# Patient Record
Sex: Female | Born: 1945 | Race: White | Hispanic: No | Marital: Married | Smoking: Never smoker
Health system: Southern US, Community
[De-identification: ages and names within clinical notes are randomized; demographics above are authoritative.]

## PROBLEM LIST (undated history)

## (undated) DIAGNOSIS — K219 Gastro-esophageal reflux disease without esophagitis: Secondary | ICD-10-CM

## (undated) DIAGNOSIS — M7630 Iliotibial band syndrome, unspecified leg: Secondary | ICD-10-CM

## (undated) DIAGNOSIS — J939 Pneumothorax, unspecified: Secondary | ICD-10-CM

## (undated) DIAGNOSIS — F329 Major depressive disorder, single episode, unspecified: Secondary | ICD-10-CM

## (undated) DIAGNOSIS — IMO0001 Reserved for inherently not codable concepts without codable children: Secondary | ICD-10-CM

## (undated) DIAGNOSIS — R0600 Dyspnea, unspecified: Secondary | ICD-10-CM

## (undated) DIAGNOSIS — D45 Polycythemia vera: Principal | ICD-10-CM

## (undated) DIAGNOSIS — T7840XA Allergy, unspecified, initial encounter: Secondary | ICD-10-CM

## (undated) DIAGNOSIS — I493 Ventricular premature depolarization: Secondary | ICD-10-CM

## (undated) DIAGNOSIS — G43909 Migraine, unspecified, not intractable, without status migrainosus: Secondary | ICD-10-CM

## (undated) DIAGNOSIS — F32A Depression, unspecified: Secondary | ICD-10-CM

## (undated) DIAGNOSIS — N189 Chronic kidney disease, unspecified: Secondary | ICD-10-CM

## (undated) DIAGNOSIS — K8020 Calculus of gallbladder without cholecystitis without obstruction: Secondary | ICD-10-CM

## (undated) DIAGNOSIS — G713 Mitochondrial myopathy, not elsewhere classified: Secondary | ICD-10-CM

## (undated) DIAGNOSIS — Z0389 Encounter for observation for other suspected diseases and conditions ruled out: Secondary | ICD-10-CM

## (undated) DIAGNOSIS — I219 Acute myocardial infarction, unspecified: Secondary | ICD-10-CM

## (undated) DIAGNOSIS — K589 Irritable bowel syndrome without diarrhea: Secondary | ICD-10-CM

## (undated) DIAGNOSIS — I499 Cardiac arrhythmia, unspecified: Secondary | ICD-10-CM

## (undated) DIAGNOSIS — Z5189 Encounter for other specified aftercare: Secondary | ICD-10-CM

## (undated) DIAGNOSIS — D649 Anemia, unspecified: Secondary | ICD-10-CM

## (undated) DIAGNOSIS — S22000A Wedge compression fracture of unspecified thoracic vertebra, initial encounter for closed fracture: Secondary | ICD-10-CM

## (undated) DIAGNOSIS — K649 Unspecified hemorrhoids: Secondary | ICD-10-CM

## (undated) DIAGNOSIS — E559 Vitamin D deficiency, unspecified: Secondary | ICD-10-CM

## (undated) DIAGNOSIS — F419 Anxiety disorder, unspecified: Secondary | ICD-10-CM

## (undated) DIAGNOSIS — J45909 Unspecified asthma, uncomplicated: Secondary | ICD-10-CM

## (undated) DIAGNOSIS — I319 Disease of pericardium, unspecified: Secondary | ICD-10-CM

## (undated) DIAGNOSIS — H269 Unspecified cataract: Secondary | ICD-10-CM

## (undated) HISTORY — DX: Encounter for observation for other suspected diseases and conditions ruled out: Z03.89

## (undated) HISTORY — DX: Iliotibial band syndrome, unspecified leg: M76.30

## (undated) HISTORY — PX: MOUTH SURGERY: SHX715

## (undated) HISTORY — DX: Ventricular premature depolarization: I49.3

## (undated) HISTORY — DX: Vitamin D deficiency, unspecified: E55.9

## (undated) HISTORY — DX: Polycythemia vera: D45

## (undated) HISTORY — DX: Migraine, unspecified, not intractable, without status migrainosus: G43.909

## (undated) HISTORY — PX: APPENDECTOMY: SHX54

## (undated) HISTORY — PX: VARICOSE VEIN SURGERY: SHX832

## (undated) HISTORY — DX: Pneumothorax, unspecified: J93.9

## (undated) HISTORY — DX: Reserved for inherently not codable concepts without codable children: IMO0001

## (undated) HISTORY — DX: Mitochondrial myopathy, not elsewhere classified: G71.3

## (undated) HISTORY — PX: CARDIAC CATHETERIZATION: SHX172

## (undated) HISTORY — DX: Irritable bowel syndrome, unspecified: K58.9

## (undated) HISTORY — PX: CATARACT EXTRACTION W/ INTRAOCULAR LENS  IMPLANT, BILATERAL: SHX1307

## (undated) HISTORY — DX: Unspecified hemorrhoids: K64.9

## (undated) HISTORY — DX: Allergy, unspecified, initial encounter: T78.40XA

## (undated) HISTORY — DX: Disease of pericardium, unspecified: I31.9

## (undated) HISTORY — DX: Anemia, unspecified: D64.9

## (undated) HISTORY — PX: NASAL SINUS SURGERY: SHX719

## (undated) HISTORY — PX: COLONOSCOPY: SHX174

## (undated) HISTORY — DX: Anxiety disorder, unspecified: F41.9

## (undated) HISTORY — DX: Encounter for other specified aftercare: Z51.89

## (undated) HISTORY — DX: Unspecified cataract: H26.9

## (undated) HISTORY — DX: Calculus of gallbladder without cholecystitis without obstruction: K80.20

## (undated) HISTORY — DX: Unspecified asthma, uncomplicated: J45.909

## (undated) HISTORY — DX: Acute myocardial infarction, unspecified: I21.9

---

## 1997-05-12 ENCOUNTER — Emergency Department (HOSPITAL_COMMUNITY): Admission: EM | Admit: 1997-05-12 | Discharge: 1997-05-12 | Payer: Self-pay | Admitting: Emergency Medicine

## 1997-06-21 ENCOUNTER — Encounter: Admission: RE | Admit: 1997-06-21 | Discharge: 1997-06-21 | Payer: Self-pay | Admitting: Sports Medicine

## 1997-11-27 ENCOUNTER — Encounter: Admission: RE | Admit: 1997-11-27 | Discharge: 1997-11-27 | Payer: Self-pay | Admitting: Sports Medicine

## 1997-12-17 ENCOUNTER — Encounter: Admission: RE | Admit: 1997-12-17 | Discharge: 1997-12-17 | Payer: Self-pay | Admitting: Family Medicine

## 1998-09-26 ENCOUNTER — Encounter: Admission: RE | Admit: 1998-09-26 | Discharge: 1998-09-26 | Payer: Self-pay | Admitting: Sports Medicine

## 1998-10-08 ENCOUNTER — Encounter: Admission: RE | Admit: 1998-10-08 | Discharge: 1998-10-08 | Payer: Self-pay | Admitting: Family Medicine

## 1998-12-10 ENCOUNTER — Encounter: Admission: RE | Admit: 1998-12-10 | Discharge: 1998-12-10 | Payer: Self-pay | Admitting: Sports Medicine

## 1998-12-17 ENCOUNTER — Encounter: Admission: RE | Admit: 1998-12-17 | Discharge: 1998-12-17 | Payer: Self-pay | Admitting: Sports Medicine

## 1998-12-17 ENCOUNTER — Other Ambulatory Visit: Admission: RE | Admit: 1998-12-17 | Discharge: 1998-12-17 | Payer: Self-pay | Admitting: Family Medicine

## 1999-01-31 ENCOUNTER — Encounter: Payer: Self-pay | Admitting: Sports Medicine

## 1999-01-31 ENCOUNTER — Encounter: Admission: RE | Admit: 1999-01-31 | Discharge: 1999-01-31 | Payer: Self-pay | Admitting: Sports Medicine

## 1999-10-23 ENCOUNTER — Encounter: Admission: RE | Admit: 1999-10-23 | Discharge: 1999-10-23 | Payer: Self-pay | Admitting: Sports Medicine

## 1999-12-03 ENCOUNTER — Encounter: Admission: RE | Admit: 1999-12-03 | Discharge: 1999-12-03 | Payer: Self-pay | Admitting: Family Medicine

## 1999-12-11 ENCOUNTER — Encounter: Admission: RE | Admit: 1999-12-11 | Discharge: 1999-12-11 | Payer: Self-pay | Admitting: Sports Medicine

## 2000-03-18 ENCOUNTER — Encounter: Admission: RE | Admit: 2000-03-18 | Discharge: 2000-03-18 | Payer: Self-pay | Admitting: Family Medicine

## 2000-03-22 ENCOUNTER — Encounter: Admission: RE | Admit: 2000-03-22 | Discharge: 2000-03-22 | Payer: Self-pay | Admitting: Sports Medicine

## 2000-03-22 ENCOUNTER — Encounter: Payer: Self-pay | Admitting: Sports Medicine

## 2000-10-28 ENCOUNTER — Encounter: Admission: RE | Admit: 2000-10-28 | Discharge: 2000-10-28 | Payer: Self-pay | Admitting: Sports Medicine

## 2000-11-04 ENCOUNTER — Encounter: Payer: Self-pay | Admitting: Sports Medicine

## 2000-11-04 ENCOUNTER — Encounter: Admission: RE | Admit: 2000-11-04 | Discharge: 2000-11-04 | Payer: Self-pay | Admitting: Sports Medicine

## 2000-12-02 ENCOUNTER — Encounter: Admission: RE | Admit: 2000-12-02 | Discharge: 2000-12-02 | Payer: Self-pay | Admitting: Family Medicine

## 2001-02-23 ENCOUNTER — Encounter: Admission: RE | Admit: 2001-02-23 | Discharge: 2001-02-23 | Payer: Self-pay | Admitting: Family Medicine

## 2001-02-23 ENCOUNTER — Ambulatory Visit (HOSPITAL_COMMUNITY): Admission: RE | Admit: 2001-02-23 | Discharge: 2001-02-23 | Payer: Self-pay | Admitting: Family Medicine

## 2001-02-25 ENCOUNTER — Ambulatory Visit (HOSPITAL_COMMUNITY): Admission: RE | Admit: 2001-02-25 | Discharge: 2001-02-25 | Payer: Self-pay | Admitting: Sports Medicine

## 2001-02-25 ENCOUNTER — Encounter: Payer: Self-pay | Admitting: Sports Medicine

## 2001-02-25 ENCOUNTER — Inpatient Hospital Stay (HOSPITAL_COMMUNITY): Admission: EM | Admit: 2001-02-25 | Discharge: 2001-02-26 | Payer: Self-pay | Admitting: Emergency Medicine

## 2001-03-10 ENCOUNTER — Encounter: Admission: RE | Admit: 2001-03-10 | Discharge: 2001-03-10 | Payer: Self-pay | Admitting: Family Medicine

## 2001-09-14 ENCOUNTER — Encounter: Payer: Self-pay | Admitting: Internal Medicine

## 2001-10-13 ENCOUNTER — Encounter: Payer: Self-pay | Admitting: Internal Medicine

## 2001-11-03 ENCOUNTER — Encounter: Admission: RE | Admit: 2001-11-03 | Discharge: 2001-11-03 | Payer: Self-pay | Admitting: Sports Medicine

## 2002-07-25 ENCOUNTER — Encounter: Admission: RE | Admit: 2002-07-25 | Discharge: 2002-07-25 | Payer: Self-pay | Admitting: Sports Medicine

## 2002-08-03 ENCOUNTER — Encounter: Admission: RE | Admit: 2002-08-03 | Discharge: 2002-08-03 | Payer: Self-pay | Admitting: Sports Medicine

## 2002-08-31 ENCOUNTER — Encounter: Admission: RE | Admit: 2002-08-31 | Discharge: 2002-08-31 | Payer: Self-pay | Admitting: Family Medicine

## 2002-12-26 ENCOUNTER — Encounter: Admission: RE | Admit: 2002-12-26 | Discharge: 2003-03-26 | Payer: Self-pay | Admitting: Sports Medicine

## 2003-02-02 ENCOUNTER — Emergency Department (HOSPITAL_COMMUNITY): Admission: EM | Admit: 2003-02-02 | Discharge: 2003-02-02 | Payer: Self-pay | Admitting: Emergency Medicine

## 2003-04-27 ENCOUNTER — Encounter (INDEPENDENT_AMBULATORY_CARE_PROVIDER_SITE_OTHER): Payer: Self-pay | Admitting: *Deleted

## 2003-04-27 LAB — CONVERTED CEMR LAB

## 2003-05-17 ENCOUNTER — Encounter: Admission: RE | Admit: 2003-05-17 | Discharge: 2003-05-17 | Payer: Self-pay | Admitting: Sports Medicine

## 2003-05-17 ENCOUNTER — Encounter: Payer: Self-pay | Admitting: Sports Medicine

## 2003-05-30 ENCOUNTER — Encounter: Admission: RE | Admit: 2003-05-30 | Discharge: 2003-05-30 | Payer: Self-pay | Admitting: Family Medicine

## 2003-06-06 ENCOUNTER — Encounter: Admission: RE | Admit: 2003-06-06 | Discharge: 2003-06-06 | Payer: Self-pay | Admitting: Family Medicine

## 2003-08-31 ENCOUNTER — Encounter: Admission: RE | Admit: 2003-08-31 | Discharge: 2003-08-31 | Payer: Self-pay | Admitting: Family Medicine

## 2004-01-31 ENCOUNTER — Ambulatory Visit: Payer: Self-pay | Admitting: Sports Medicine

## 2004-02-05 ENCOUNTER — Ambulatory Visit: Payer: Self-pay | Admitting: Family Medicine

## 2004-11-14 ENCOUNTER — Ambulatory Visit: Payer: Self-pay | Admitting: Sports Medicine

## 2005-04-09 ENCOUNTER — Ambulatory Visit: Payer: Self-pay | Admitting: Sports Medicine

## 2005-04-20 ENCOUNTER — Ambulatory Visit: Payer: Self-pay | Admitting: Family Medicine

## 2006-02-19 ENCOUNTER — Ambulatory Visit: Payer: Self-pay | Admitting: Family Medicine

## 2006-03-25 DIAGNOSIS — G250 Essential tremor: Secondary | ICD-10-CM | POA: Insufficient documentation

## 2006-03-25 DIAGNOSIS — D510 Vitamin B12 deficiency anemia due to intrinsic factor deficiency: Secondary | ICD-10-CM | POA: Insufficient documentation

## 2006-03-25 DIAGNOSIS — G252 Other specified forms of tremor: Secondary | ICD-10-CM

## 2006-03-25 DIAGNOSIS — K589 Irritable bowel syndrome without diarrhea: Secondary | ICD-10-CM | POA: Insufficient documentation

## 2006-03-25 DIAGNOSIS — N951 Menopausal and female climacteric states: Secondary | ICD-10-CM | POA: Insufficient documentation

## 2006-03-25 DIAGNOSIS — J449 Chronic obstructive pulmonary disease, unspecified: Secondary | ICD-10-CM | POA: Insufficient documentation

## 2006-03-26 ENCOUNTER — Encounter (INDEPENDENT_AMBULATORY_CARE_PROVIDER_SITE_OTHER): Payer: Self-pay | Admitting: *Deleted

## 2006-04-25 ENCOUNTER — Observation Stay (HOSPITAL_COMMUNITY): Admission: AD | Admit: 2006-04-25 | Discharge: 2006-04-26 | Payer: Self-pay | Admitting: Emergency Medicine

## 2006-04-25 ENCOUNTER — Encounter: Payer: Self-pay | Admitting: Emergency Medicine

## 2006-04-25 ENCOUNTER — Ambulatory Visit: Payer: Self-pay | Admitting: Family Medicine

## 2006-04-27 HISTORY — PX: NM MYOCAR PERF WALL MOTION: HXRAD629

## 2006-05-20 ENCOUNTER — Ambulatory Visit: Payer: Self-pay | Admitting: Sports Medicine

## 2006-07-16 ENCOUNTER — Ambulatory Visit: Payer: Self-pay | Admitting: Family Medicine

## 2006-10-13 ENCOUNTER — Encounter: Payer: Self-pay | Admitting: Sports Medicine

## 2007-07-28 ENCOUNTER — Ambulatory Visit: Payer: Self-pay | Admitting: Sports Medicine

## 2007-07-28 DIAGNOSIS — G43909 Migraine, unspecified, not intractable, without status migrainosus: Secondary | ICD-10-CM | POA: Insufficient documentation

## 2007-07-28 DIAGNOSIS — G729 Myopathy, unspecified: Secondary | ICD-10-CM | POA: Insufficient documentation

## 2007-07-28 LAB — CONVERTED CEMR LAB
Cholesterol: 194 mg/dL (ref 0–200)
HCT: 46.7 % — ABNORMAL HIGH (ref 36.0–46.0)
HDL: 65 mg/dL (ref >39)
Hemoglobin: 15.4 g/dL — ABNORMAL HIGH (ref 12.0–15.0)
MCHC: 33 g/dL (ref 30.0–36.0)
MCV: 108.4 fL — ABNORMAL HIGH (ref 78.0–100.0)
Platelets: 155 10*3/uL (ref 150–400)
RBC: 4.31 M/uL (ref 3.87–5.11)
RDW: 13.8 % (ref 11.5–15.5)
TSH: 2.418 microintl units/mL (ref 0.350–4.50)
Vit D, 1,25-Dihydroxy: 27 — ABNORMAL LOW (ref 30–89)
Vitamin B-12: 342 pg/mL (ref 211–911)
WBC: 5.1 10*3/uL (ref 4.0–10.5)

## 2007-08-10 ENCOUNTER — Encounter: Payer: Self-pay | Admitting: *Deleted

## 2007-09-27 ENCOUNTER — Encounter: Payer: Self-pay | Admitting: Sports Medicine

## 2007-11-11 ENCOUNTER — Ambulatory Visit: Payer: Self-pay | Admitting: Family Medicine

## 2007-12-16 ENCOUNTER — Encounter: Payer: Self-pay | Admitting: Family Medicine

## 2007-12-16 LAB — CONVERTED CEMR LAB: Pap Smear: NORMAL

## 2008-06-20 ENCOUNTER — Encounter: Payer: Self-pay | Admitting: Family Medicine

## 2008-07-27 ENCOUNTER — Ambulatory Visit: Payer: Self-pay | Admitting: Family Medicine

## 2008-07-27 LAB — CONVERTED CEMR LAB
ALT: 23 units/L (ref 0–35)
AST: 31 units/L (ref 0–37)
Albumin: 4.1 g/dL (ref 3.5–5.2)
Alkaline Phosphatase: 58 units/L (ref 39–117)
BUN: 13 mg/dL (ref 6–23)
Basophils Absolute: 0 10*3/uL (ref 0.0–0.1)
Basophils Relative: 0 % (ref 0–1)
CO2: 23 meq/L (ref 19–32)
Calcium: 9.4 mg/dL (ref 8.4–10.5)
Chloride: 104 meq/L (ref 96–112)
Cholesterol: 215 mg/dL — ABNORMAL HIGH (ref 0–200)
Creatinine, Ser: 0.65 mg/dL (ref 0.40–1.20)
Eosinophils Absolute: 0.1 10*3/uL (ref 0.0–0.7)
Eosinophils Relative: 1 % (ref 0–5)
Glucose, Bld: 85 mg/dL (ref 70–99)
HCT: 44.3 % (ref 36.0–46.0)
HDL: 81 mg/dL (ref >39)
Hemoglobin: 15.3 g/dL — ABNORMAL HIGH (ref 12.0–15.0)
LDL Cholesterol: 116 mg/dL — ABNORMAL HIGH (ref 0–99)
Lymphocytes Relative: 23 % (ref 12–46)
Lymphs Abs: 1.6 10*3/uL (ref 0.7–4.0)
MCHC: 34.5 g/dL (ref 30.0–36.0)
MCV: 102.3 fL — ABNORMAL HIGH (ref 78.0–100.0)
Monocytes Absolute: 0.7 10*3/uL (ref 0.1–1.0)
Monocytes Relative: 10 % (ref 3–12)
Neutro Abs: 4.4 10*3/uL (ref 1.7–7.7)
Neutrophils Relative %: 65 % (ref 43–77)
Platelets: 169 10*3/uL (ref 150–400)
Potassium: 3.7 meq/L (ref 3.5–5.3)
RBC: 4.33 M/uL (ref 3.87–5.11)
RDW: 12.8 % (ref 11.5–15.5)
Sodium: 140 meq/L (ref 135–145)
Total Bilirubin: 1.5 mg/dL — ABNORMAL HIGH (ref 0.3–1.2)
Total CHOL/HDL Ratio: 2.7
Total Protein: 6.8 g/dL (ref 6.0–8.3)
Triglycerides: 90 mg/dL (ref <150)
VLDL: 18 mg/dL (ref 0–40)
WBC: 6.7 10*3/uL (ref 4.0–10.5)

## 2008-07-30 ENCOUNTER — Encounter: Payer: Self-pay | Admitting: Family Medicine

## 2008-07-31 ENCOUNTER — Encounter: Payer: Self-pay | Admitting: Family Medicine

## 2008-07-31 LAB — CONVERTED CEMR LAB: Vit D, 25-Hydroxy: 36 ng/mL (ref 30–89)

## 2008-08-02 ENCOUNTER — Encounter: Payer: Self-pay | Admitting: Family Medicine

## 2008-08-03 ENCOUNTER — Encounter: Payer: Self-pay | Admitting: Family Medicine

## 2008-08-20 ENCOUNTER — Ambulatory Visit: Payer: Self-pay | Admitting: Family Medicine

## 2008-08-23 ENCOUNTER — Ambulatory Visit: Payer: Self-pay | Admitting: Sports Medicine

## 2008-08-23 DIAGNOSIS — M629 Disorder of muscle, unspecified: Secondary | ICD-10-CM | POA: Insufficient documentation

## 2008-11-14 ENCOUNTER — Encounter: Payer: Self-pay | Admitting: Family Medicine

## 2008-11-29 ENCOUNTER — Ambulatory Visit: Payer: Self-pay | Admitting: Internal Medicine

## 2008-11-29 DIAGNOSIS — F411 Generalized anxiety disorder: Secondary | ICD-10-CM | POA: Insufficient documentation

## 2008-11-29 LAB — CONVERTED CEMR LAB
ALT: 34 units/L (ref 0–35)
AST: 48 units/L — ABNORMAL HIGH (ref 0–37)
Albumin: 3.9 g/dL (ref 3.5–5.2)
Alkaline Phosphatase: 67 units/L (ref 39–117)
BUN: 7 mg/dL (ref 6–23)
CO2: 29 meq/L (ref 19–32)
Calcium: 9.3 mg/dL (ref 8.4–10.5)
Chloride: 101 meq/L (ref 96–112)
Creatinine, Ser: 0.6 mg/dL (ref 0.4–1.2)
GFR calc non Af Amer: 106.54 mL/min (ref >60)
Glucose, Bld: 98 mg/dL (ref 70–99)
Potassium: 3.9 meq/L (ref 3.5–5.1)
Sodium: 138 meq/L (ref 135–145)
Total Bilirubin: 1.9 mg/dL — ABNORMAL HIGH (ref 0.3–1.2)
Total Protein: 7.3 g/dL (ref 6.0–8.3)

## 2008-11-30 LAB — CONVERTED CEMR LAB: Retic Ct Pct: 1.4 % (ref 0.4–3.1)

## 2008-12-03 ENCOUNTER — Ambulatory Visit (HOSPITAL_COMMUNITY): Admission: RE | Admit: 2008-12-03 | Discharge: 2008-12-03 | Payer: Self-pay | Admitting: Internal Medicine

## 2009-08-13 ENCOUNTER — Ambulatory Visit: Payer: Self-pay | Admitting: Family Medicine

## 2009-08-13 DIAGNOSIS — G4700 Insomnia, unspecified: Secondary | ICD-10-CM | POA: Insufficient documentation

## 2009-08-13 LAB — CONVERTED CEMR LAB: Vit D, 25-Hydroxy: 57 ng/mL (ref 30–89)

## 2009-08-14 ENCOUNTER — Encounter: Payer: Self-pay | Admitting: Family Medicine

## 2009-11-28 ENCOUNTER — Encounter: Payer: Self-pay | Admitting: Family Medicine

## 2010-01-29 ENCOUNTER — Telehealth: Payer: Self-pay | Admitting: Family Medicine

## 2010-02-07 ENCOUNTER — Encounter
Admission: RE | Admit: 2010-02-07 | Discharge: 2010-02-07 | Payer: Self-pay | Source: Home / Self Care | Attending: Obstetrics and Gynecology | Admitting: Obstetrics and Gynecology

## 2010-02-11 ENCOUNTER — Ambulatory Visit
Admission: RE | Admit: 2010-02-11 | Discharge: 2010-02-11 | Payer: Self-pay | Source: Home / Self Care | Attending: Internal Medicine | Admitting: Internal Medicine

## 2010-02-25 ENCOUNTER — Ambulatory Visit
Admission: RE | Admit: 2010-02-25 | Discharge: 2010-02-25 | Payer: Self-pay | Source: Home / Self Care | Attending: Internal Medicine | Admitting: Internal Medicine

## 2010-02-25 ENCOUNTER — Encounter: Payer: Self-pay | Admitting: Family Medicine

## 2010-02-25 NOTE — Assessment & Plan Note (Signed)
Summary: cpe,tcb   Vital Signs:  Patient profile:   65 year old female Height:      63 inches Weight:      134 pounds BMI:     23.82 Temp:     98.2 degrees F oral Pulse rate:   91 / minute BP sitting:   117 / 77  (left arm)  Vitals Entered By: Tessie Fass CMA (August 13, 2009 10:36 AM) CC: CPE Is Patient Diabetic? No Pain Assessment Patient in pain? no        Primary Care Provider:  Paula Compton, MD  CC:  CPE.  History of Present Illness: [name] is here for annual checkup.  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  Has been managed by yoga, exercise, acupuncture.  Continues with fatigue; housekeeping leaves her exhausted.  Relatively unchanged in the past year.   Migraines have been stable, none recently.  Does not use triptans hardly ever.  Has not used her albuterol in a long time, does not really need it.   Reviewed excellent labs from last July 2010; she did these nonfasting, because she is cannot come in fasting state (becomes very weak).    Has seen Dr Juanda Chance for workup of her alternating diarrhea and constipation, had abd [place] which showed cholelithiasis without cholecystitis.   Sees Dr Yetta Barre for dermatology; has had AKs frozen off there.   Got PAP and mammography in November 2010 with her Gyn; both were negative.  Also has vitamin D level checked at [facility] and here; still taking 1000IU of Vit D a day.   Habits & Providers  Alcohol-Tobacco-Diet     Tobacco Status: quit  Allergies: No Known Drug Allergies  Social History: Smoking Status:  quit  Physical Exam  Eyes:  No corneal or conjunctival inflammation noted. EOMI. Perrla. Vision grossly normal. Ears:  External ear exam shows no significant lesions or deformities.  Otoscopic examination reveals clear canals, tympanic membranes are intact bilaterally without bulging, retraction, inflammation or discharge.  Hearing is grossly normal bilaterally. Mouth:  Oral mucosa and oropharynx without lesions or exudates.  Teeth in good repair. Neck:  No deformities, masses, or tenderness noted. Chest Wall:  L upper chest wiht seborrheic keratosis measuring <1cm diameter; R upper chest wiht AK of same size.  Lungs:  Normal respiratory effort, chest expands symmetrically. Lungs are clear to auscultation, no crackles or wheezes. Heart:  Normal rate and regular rhythm. S1 and S2 normal without gallop, murmur, click, rub or other extra sounds. Abdomen:  Bowel sounds positive,abdomen soft and non-tender without masses, organomegaly or hernias noted. Extremities:  No clubbing, cyanosis, edema, or deformity noted with normal full range of motion of all joints.     Impression & Recommendations:  Problem # 1:  UNSPECIFIED MYOPATHY (ICD-359.9) Continues management as she has been doing.   Problem # 2:  INSOMNIA UNSPECIFIED (ICD-780.52)  Has remained on the same dose of Alprazolam for a long time,without escalation.  Does not drink alcohol.  Requests refill.  I believe this is appropriate, given no increased use and clear discussion today about the med.  She is on a low dose at bedtime only.    Orders: [facility]- Est  Level 4 (78295)  Problem # 3:  MIGRAINE HEADACHE (ICD-346.90) Not needing the Imitrex.  COntinue to follow, may use Imitrex if needed.  Her updated medication list for this problem includes:  Imitrex 50 Mg Tabs (Sumatriptan succinate) ..... One at the onset of the ha, may repeat in one hour ( do not exceed 200 mg in 24 hours) as needed  Orders: [facility]- Est  Level 4 ([address]214)  Complete Medication List: 1)  Imitrex 50 Mg Tabs (Sumatriptan succinate) .... One at the onset of the ha, may repeat in one hour ( do not exceed 200 mg in 24 hours) as needed 2)  Prometrium 100 Mg Caps (Progesterone micronized) .... Take one by mouth once daily 3)  Xanax 1 Mg Tabs (Alprazolam) .... Take one by mouth at bedtime 4)   Vivelle-dot 0.025 Mg/24hr Pttw (Estradiol) .... Change patch twice weekly.  (1/2 patch at each dose)  Other Orders: Vit D, 25 [facility] ([id number])  Patient Instructions: 1)  It was a pleasure to see you today.  2)  I have refilled your prescription today.  Please let me know if the dose on the alprazolam does not work as well. 3)  Flu shot in the Fall. 4)  I will write with the results of the vitamin D level. Prescriptions: XANAX 1 MG TABS (ALPRAZOLAM) take one by mouth at bedtime  #90 x 1   Entered and Authorized by:   Paula Compton MD   Signed by:   Paula Compton MD on 08/13/2009   Method used:   Print then Give to Patient   RxID:   [id number]    Prevention & Chronic Care Immunizations   Influenza vaccine: Fluvax Non-MCR  (11/11/2007)   Influenza vaccine due: 11/10/2008    Tetanus booster: Not documented    Pneumococcal vaccine: Not documented    H. zoster vaccine: Not documented  Colorectal Screening   Hemoccult: Done.  (04/27/2003)   Hemoccult due: 04/26/2004    Colonoscopy: Location:  [facility].    (10/13/2001)  Other Screening   Pap smear: normal  (12/16/2007)   Pap smear due: 12/16/2010    Mammogram: normal  (12/13/2007)   Mammogram due: 12/12/2008    DXA bone density scan: Not documented   Smoking status: quit  (08/13/2009)  Lipids   Total Cholesterol: 215  (07/27/2008)   LDL: 116  (07/27/2008)   LDL Direct: Not documented   HDL: 81  (07/27/2008)   Triglycerides: 90  (07/27/2008)

## 2010-02-25 NOTE — Letter (Signed)
Summary: Generic Letter  [facility]  [address]   [place], [place] [address]   Phone: [phone]  Fax: [phone]    08/14/2009  [name] [address] [place], [place]  [address]  Dear Ms. Dobrowolski,   It was a pleasure to see you yesterday in the office.  I write with good news regarding your Vitamin D level.  It is in the normal range at 57 (normal range between 30 and 90).  Please let me know if you have any other questions or concerns.        Sincerely,   Paula Compton MD  Appended Document: Generic Letter mailed

## 2010-02-25 NOTE — Miscellaneous (Signed)
   Clinical Lists Changes  Problems: Removed problem of Question of  HEPATOMEGALY (ICD-789.1) Removed problem of CHANGE IN BOWELS (ICD-787.99) Removed problem of NEED PROPHYLACTIC VACCINATION&INOCULATION FLU (ICD-V04.81) Removed problem of HEALTH MAINTENANCE EXAM (ICD-V70.0) Removed problem of OTHER ABNORMAL BLOOD CHEMISTRY (ICD-790.6) Removed problem of INGROWN TOENAIL (ICD-703.0) Removed problem of PAIN IN THORACIC SPINE (ICD-724.1) Removed problem of UNSPECIFIED ANEMIA (ICD-285.9) Removed problem of MIGRAINE, UNSPEC., W/O INTRACTABLE MIGRAINE (ICD-346.90) Removed problem of CHEST PAIN (ICD-786.50) Removed problem of EXTRINSIC ASTHMA, UNSPECIFIED (ICD-493.00) Removed problem of WEAKNESS (ICD-780.79) Removed problem of KNEE PAIN, LEFT (ICD-719.46) Removed problem of SCREENING FOR LIPOID DISORDERS (ICD-V77.91) Removed problem of MUSCLE SPASM NOS (ICD-728.85) Removed problem of UNSPECIFIED VITAMIN D DEFICIENCY (ICD-268.9) Removed problem of HEMORRHOIDS, NOS (ICD-455.6)

## 2010-02-27 NOTE — Progress Notes (Signed)
  Medications Added VENTOLIN HFA 108 (90 BASE) MCG/ACT AERS (ALBUTEROL SULFATE) SIG: 2 puffs every 4 hours as needed for shortness of breath Dispense most economical albuterol HFA       Phone Note Call from Patient   Caller: Patient Call For: [phone] (h) [phone] (c) Summary of Call: Mrs. Stagner is needing rx called to pharmacy for a light inhaler.  Do not need anything stronger than per request.  Please call to [facility] on [address].    New/Updated Medications: VENTOLIN HFA 108 (90 BASE) MCG/ACT AERS (ALBUTEROL SULFATE) SIG: 2 puffs every 4 hours as needed for shortness of breath Dispense most economical albuterol HFA Prescriptions: VENTOLIN HFA 108 (90 BASE) MCG/ACT AERS (ALBUTEROL SULFATE) SIG: 2 puffs every 4 hours as needed for shortness of breath Dispense most economical albuterol HFA  #1 x 1   Entered and Authorized by:   Paula Compton MD   Signed by:   Paula Compton MD on 01/30/2010   Method used:   Electronically to        [id number] (retail)       [address]       [place], [place]  [address]       Ph: [phone]       Fax: [phone]   RxID:   [id number]  Called patient; she had a flareup of "asthma" shortness of breath while going through old books with heavy dust.  Got better when dust cleared.  Would like to have an HFA available.  Will send Albuterol HFA to [facility]. Paula Compton MD  January 30, 2010 8:44 AM

## 2010-03-12 ENCOUNTER — Encounter: Payer: Self-pay | Admitting: *Deleted

## 2010-03-13 NOTE — Consult Note (Signed)
[facility]: [facility]   Imported By: De Nurse 02/28/2010 12:27:42  _____________________________________________________________________  External Attachment:    Type:   Image     Comment:   External Document

## 2010-03-18 ENCOUNTER — Ambulatory Visit (INDEPENDENT_AMBULATORY_CARE_PROVIDER_SITE_OTHER): Payer: Medicare Other | Admitting: Sports Medicine

## 2010-03-18 ENCOUNTER — Encounter: Payer: Self-pay | Admitting: Sports Medicine

## 2010-03-18 DIAGNOSIS — M7750 Other enthesopathy of unspecified foot: Secondary | ICD-10-CM

## 2010-03-18 DIAGNOSIS — M818 Other osteoporosis without current pathological fracture: Secondary | ICD-10-CM

## 2010-03-25 NOTE — Assessment & Plan Note (Signed)
Summary: LEFT FOOT PAIN/MJD   Vital Signs:  Patient profile:   65 year old female BP sitting:   113 / 77  Vitals Entered By: Lillia Pauls CMA (March 18, 2010 11:42 AM)  Referring Provider:  Paula Compton, MD Primary Provider:  Paula Compton, MD   History of Present Illness: Pt presents to clinic with left foot pain-1st and 2nd metatarsals- plantar aspect since 03/14/10.  Does not recall injury.   has hx of osteoporosis has hx of mitochondrial disorder  note that she has frquent stumbles sometimes jams foot does not recall a specific injury on this occassion  no swelling noted but pain causes limp  Allergies: No Known Drug Allergies  Physical Exam  General:  Well-developed,well-nourished,in no acute distress; alert,appropriate and cooperative throughout examination Msk:  Pain with palpation of 1st and 2nd metatarsal - plantar aspect left foot. The area of max TTP localized over distal shaft 2nd MT no real dorsal TTP no swelling standing has some loss of transverse arch   Impression & Recommendations:  Problem # 1:  METATARSALGIA (ICD-726.70) Note this is a new problem does not seem like a stress fx or post traumatic  MT pad placed some first MTP cushion placed  after this was able to walk with no real limp will try this and add to other shoes if it helps if note reevaluate  Problem # 2:  IDIOPATHIC OSTEOPOROSIS (ICD-733.02) by HX has osteoporosis  If this is not resolving MSK [place] to r/o stress fx of MT but this does not seem to be case on exam today  Problem # 3:  UNSPECIFIED MYOPATHY (ICD-359.9) Note - I sent her to [facility] for this a number of years ago.  they don't feel there is any specific treatment beyond trying good nutritional support continuing on calcium and Vit D and work with osteoporosis  however, recent paper suggests trial of creatine for older patients with this type of weakness will add creatine 5 gms daily as trial see how this affects her  strength gain and if it cuts down on falls  Copy to Dr Lenord Fellers  Complete Medication List: 1)  Imitrex 50 Mg Tabs (Sumatriptan succinate) .... One at the onset of the ha, may repeat in one hour ( do not exceed 200 mg in 24 hours) as needed 2)  Prometrium 100 Mg Caps (Progesterone micronized) .... Take one by mouth once daily 3)  Xanax 1 Mg Tabs (Alprazolam) .... Take one by mouth at bedtime 4)  Vivelle-dot 0.025 Mg/24hr Pttw (Estradiol) .... Change patch twice weekly.  (1/2 patch at each dose) 5)  Ventolin Hfa 108 (90 Base) Mcg/act Aers (Albuterol sulfate) .... Sig: 2 puffs every 4 hours as needed for shortness of breath dispense most economical albuterol hfa  Patient Instructions: 1)  You do have a stone bruise on your left foot- which is a bruising of the bone on the under surface.   2)  Please wear shoes with cushioning that Dr. Darrick Penna applied for at least 6 weeks. 3)  Bring other shoes into office and we can apply cushions to those shoes also.  4)  Take creatine monophosphate- 5 grams daily- you can buy this at [facility] or [facility]   Orders Added: 1)  Est. Patient Level III [[address]]

## 2010-03-31 ENCOUNTER — Encounter: Payer: Self-pay | Admitting: Home Health Services

## 2010-04-04 ENCOUNTER — Ambulatory Visit (INDEPENDENT_AMBULATORY_CARE_PROVIDER_SITE_OTHER): Payer: Medicare Other | Admitting: Family Medicine

## 2010-04-04 ENCOUNTER — Encounter: Payer: Self-pay | Admitting: Family Medicine

## 2010-04-04 DIAGNOSIS — Z79899 Other long term (current) drug therapy: Secondary | ICD-10-CM

## 2010-04-04 DIAGNOSIS — G43909 Migraine, unspecified, not intractable, without status migrainosus: Secondary | ICD-10-CM

## 2010-04-04 DIAGNOSIS — G729 Myopathy, unspecified: Secondary | ICD-10-CM

## 2010-04-04 DIAGNOSIS — J45909 Unspecified asthma, uncomplicated: Secondary | ICD-10-CM

## 2010-04-04 DIAGNOSIS — Z8639 Personal history of other endocrine, nutritional and metabolic disease: Secondary | ICD-10-CM

## 2010-04-04 DIAGNOSIS — Z862 Personal history of diseases of the blood and blood-forming organs and certain disorders involving the immune mechanism: Secondary | ICD-10-CM

## 2010-04-04 DIAGNOSIS — F411 Generalized anxiety disorder: Secondary | ICD-10-CM

## 2010-04-04 DIAGNOSIS — D510 Vitamin B12 deficiency anemia due to intrinsic factor deficiency: Secondary | ICD-10-CM

## 2010-04-04 DIAGNOSIS — IMO0002 Reserved for concepts with insufficient information to code with codable children: Secondary | ICD-10-CM

## 2010-04-04 MED ORDER — ALPRAZOLAM 0.25 MG PO TABS: 0.2500 mg | ORAL_TABLET | Freq: Every evening | ORAL | Status: DC | PRN

## 2010-04-04 NOTE — Progress Notes (Signed)
  Subjective:    Patient ID: [name], female    DOB: 1945-03-09, 65 y.o.   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  She had muscle biopsy done twice at [facility], was seen by their Neurology service and has not been followed there since around 2005.    Her condition is characterized by profound weakness and fatigue; sleeps most of the day and lacks energy to do many household chores.  She sees a Systems analyst and lifts 2 lb dumbbells to maintain arm strength.  Is interested in improving her quality of life as much as possible, while recognizing limitations of her condition.   She has had worsening dyspnea with minor exertion in recent months; climbing a single flight of stairs causes her to lose her breath and it takes several minutes to regain it.  Not characterized by cough.  No fevers or chills.   Family Hx: mother was severe asthmatic; on chronic steroids and had several fractured bones.  Father smoked 5 packs unfiltered Camels a day, died after several MI and CVAs with COPD.    Social Hx; Lives with her husband.  She has never been a smoker.     Review of Systems     Objective:   Physical Exam  Constitutional:       Alert, pleasant, no apparent distress while sitting on exam table or on chair.  Takes several minutes to get herself down from exam table.   HENT:  Head: Normocephalic and atraumatic.  Eyes: Pupils are equal, round, and reactive to light. Right  eye exhibits no discharge. Left eye exhibits no discharge.  Neck: Normal range of motion. Neck supple. No JVD present. No tracheal deviation present. No thyromegaly present.  Cardiovascular: Normal rate, regular rhythm and normal heart sounds.  Exam reveals no friction rub.   No murmur heard. Pulmonary/Chest: Breath sounds normal. No respiratory distress. She has no wheezes. She has no rales. She exhibits no tenderness.  Musculoskeletal:       No active sinovitis in hands/fingers/wrists.  Handgrip 4/5 bilaterally symmetric; strength testing wrists, elbows and shoulders symmetric 4/5.  Hip flexion 4/5 symmetric bilaterally.   Lymphadenopathy:    She has no cervical adenopathy.  Neurological:       Finger to nose without tremor.  Mild resting tremor bilat hands.           Assessment & Plan:

## 2010-04-04 NOTE — Assessment & Plan Note (Signed)
Labs done by Dr Wyvonnia Lora (her gyn) Dec 23, 2009: WBC 6.6; Hgb 15.7; Hct 45.9; platelets 189K.

## 2010-04-04 NOTE — Patient Instructions (Signed)
It was a pleasure to see you today.  I would like to have you do spirometry (lung function tests) in our office with the Pharmacy Clinic.   For the mitrochondrial myopathy, I will send for the results of the muscle biopsy from [facility] (2004 to 2005); we may want to have you seen by a rheumatologist here in [place].   I will put in orders for a direct LDL cholesterol and a Hemoglobin A1C to check your overall sugar control.  These are nonfasting tests.  I would like to see you back after these things are done, in 2 to 3 months.

## 2010-04-04 NOTE — Assessment & Plan Note (Signed)
Patient takes Alprazolam 0.25mg  at bedtime only as needed for sleep. Refill given today.

## 2010-04-04 NOTE — Assessment & Plan Note (Signed)
Reports that she has not had many headaches lately; still has her triptan on hand in case of migraine.

## 2010-04-04 NOTE — Assessment & Plan Note (Signed)
Reports history of asthma since her youth; today reporting worsening dyspnea when going up a single flight of stairs, to the point that she must stand still for several minutes to recuperate.  No knowledge of formal spirometry studies; will order through our Pharmacy Clinic at [facility].  She has not needed her ProAir for flares recently.

## 2010-04-04 NOTE — Assessment & Plan Note (Signed)
History of babies larger than 4kg (9# and 10#).  Will order A1C to check for occult DM.  Other labs from her Gyn Dr. Conley Simmonds, done 12/23/09: TSH 2.778, free T4  0.98; total chol 252 (cannot do fasting tests due to fatigue)

## 2010-04-05 ENCOUNTER — Encounter: Payer: Self-pay | Admitting: *Deleted

## 2010-04-09 ENCOUNTER — Telehealth: Payer: Self-pay | Admitting: Family Medicine

## 2010-04-09 NOTE — Telephone Encounter (Signed)
Patient had sent an email regarding referral to Rheumatology for her diagnosis of mitochondrial myopathy.  She has exhaustion but no joint pains.  She had EMG testing and muscle biopsy at [facility] in 2004 when initial diagnosis was made.  The proximal muscle weakness, ophthalmoplegia have not been features of her condition.  Her mother had debilitating rheumatoid arthritis and asthma, was on chronic systemic steroids for both, was not known to have diagnosis of mitochondrial myopathy.  Patient's parents were first cousins, unsure if this would affect genetic inheritance of mitochondrial diseases. Plan to cancel the Rheumatology appointment until we get the records from [facility] (either from faxed ROI or from old paper chart that was requested).  May choose to refer to another neuromuscular clinic [facility], [facility]) for another opinion, or possibly back to [place].

## 2010-04-11 ENCOUNTER — Telehealth: Payer: Self-pay | Admitting: Family Medicine

## 2010-04-11 DIAGNOSIS — G729 Myopathy, unspecified: Secondary | ICD-10-CM

## 2010-04-11 NOTE — Telephone Encounter (Signed)
Called patient, discussed old records from [place].  Will send her a copy of the records, refer to Neuromuscular Section of Neurology Dept at [facility].

## 2010-04-11 NOTE — Assessment & Plan Note (Signed)
Called patient to discuss records from [facility], where she was originally diagnosed with mitochondrial myopathy.  Discussed the copious notes, including muscle biopsy reports.  Will refer to [facility] for further evaluation, along with associated labs.  Will cancel Rheumatology consultation. Will send a copy of her [facility] evaluation records to her home for her personal records.

## 2010-04-14 ENCOUNTER — Telehealth: Payer: Self-pay | Admitting: *Deleted

## 2010-04-14 NOTE — Telephone Encounter (Signed)
Called [facility] [phone]. In order to schedule an appointment for the pt, we have to fax all the info and a nurse will look over them to schedule the appointment with the appropriate doctor.  Faxed info to Attn: Medical Records: [phone] Called pt and informed of above. Also given number to pt to check, if we don't hear back in one week. Arlyss Repress

## 2010-04-15 ENCOUNTER — Telehealth: Payer: Self-pay | Admitting: Family Medicine

## 2010-04-15 NOTE — Telephone Encounter (Signed)
Called pt and informed of appt at [facility]. [name]

## 2010-04-15 NOTE — Telephone Encounter (Signed)
Nurse from [facility] called to give [place] date for her to see Neurologist - 08/27/10 @ 1:00pm This will be for a second opinion.

## 2010-06-13 NOTE — Discharge Summary (Signed)
[place]  Patient:    [name], [name] Visit Number: [id number] MRN: [id number]          Service Type: MED Location: [phone] Attending Physician:  Garnette Scheuermann Dictated by:   Nolon Nations, M.D. Admit Date:  02/25/2001 Discharge Date: 02/26/2001   CC:         [name], M.D.                           Discharge Summary  CONSULTATIONS:  Cardiology, Dr. Tresa Endo.  ADMISSION COMPLAINT:  EKG changes on exercise treadmill.  ADMISSION DIAGNOSES: 1. Chest pain. 2. Asthma. 3. History of migraines. 4. History of hemorrhoids. 5. History of irritable bowel syndrome. 6. History of postmenopausal.  DISCHARGE DIAGNOSES: 1. Chest pain. 2. Asthma. 3. History of migraines. 4. History of hemorrhoids. 5. History of irritable bowel syndrome. 6. History of postmenopausal.  PROCEDURES:  Cardiac catheterization.  HISTORY OF PRESENT ILLNESS:  [name] is a 65 year old patient of Dr. Darrick Hickman who was seen in clinic on Wednesday, February 23, 2001, for evaluation of a one to two month history of substernal chest pain.  She was found to have ST depressions in inferior leads.  She returned for an ETT on Friday, February 25, 2001.  During ETT, it showed significant ST depression, and was admitted to the hospital.  Please refer to the admit note for more complete history and physical.  HOSPITAL COURSE:  The patient was admitted and seen by cardiology.  She was taken to cardiac catheterization.  Cardiac catheterization revealed no acute disease.  Cards recommended treatment for costochondritis.  The EKG stress test was felt to be false-positive.  Chest pain was thought to be non-cardiac, with no evidence of cardiac disease or aortic dissection.  Further followup was last to Dr. Darrick Hickman.  Suspicion was low for pulmonary embolism, given history and lack of risk factors except for a history of HRT.  CONDITION ON DISCHARGE:   Good.  DISCHARGE MEDICATIONS: 1. Flovent 110 two puffs b.i.d. 2. Imitrex 25 mcg p.o. q.d. 3. Levatol 25 mcg p.o. p.r.n. 4. Albuterol p.r.n. 5. Premarin vaginal cream p.r.n. 6. Prempro 0.625/2.5 mg p.o. q.d. 7. Xanax 0.25 mg p.o. q.h.s.  FOLLOWUP:  The patient is to follow up at the [facility] in one week.  She is to call on Monday morning for an appointment. Dictated by:   Nolon Nations, M.D. Attending Physician:  Garnette Scheuermann DD:  02/26/01 TD:  02/28/01 Job: [id number]

## 2010-06-13 NOTE — Cardiovascular Report (Signed)
[place]. [facility]  Patient:    [name], [name] Visit Number: [id number] MRN: [id number]          Service Type: MED Location: [phone] Attending Physician:  Garnette Scheuermann Dictated by:   Runell Gess, M.D. Proc. Date: 02/25/01 Admit Date:  02/25/2001 Discharge Date: 02/26/2001   CC:         Second Floor [facility]  Madaline Savage, M.D.  [facility]  [facility], [place] [address] [place], [place] [address]                        Cardiac Catheterization  INDICATIONS:  [name] is a 64 year old white female without cardiac risk factors, who presented today with atypical chest pain.  She underwent exercise stress testing and had ST segment depression.  She was brought to the emergency room and evaluated by Dr. Lavonne Chick, who had placed her on IV heparin and nitroglycerin.  She presents now for diagnostic coronary arteriography.  DESCRIPTION OF PROCEDURE:  The patient was brought to the second floor [facility] in the postabsorptive state.  She was premedicated with p.o. Valium and IV Versed.  Her right groin was prepped and shaved in the usual sterile fashion.  One percent Xylocaine was used for local anesthesia.  A 6-French sheath was inserted into the right femoral artery using the standard Seldinger technique.  Six French right and left Judkins diagnostic catheters, along with a 6-French pigtail catheter were used for selective coronary angiography, left ventriculography, and supravalvular aortography in the LAO cranial view.  Omnipaque dye was used for the entirety of the case. Retrograde aorta, left ventricular, and pullback pressures were recorded.  HEMODYNAMICS: 1. Aortic systolic pressure 110, diastolic pressure 63. 2. Left ventricular systolic pressure 110, end-diastolic pressure 12.  SELECTIVE CORONARY  ANGIOGRAPHY: 1. Left main:  Normal. 2. Left anterior descending artery:  Normal. 3. Left circumflex:  Normal. 4. Right coronary artery:  Dominant and normal.  LEFT VENTRICULOGRAPHY:  RAO left ventriculogram was performed using 20 cc of Omnipaque dye at 10 cc/sec.  The overall LV EF was estimated at greater than 60% without focal wall motion abnormalities.  SUPRAVALVULAR AORTOGRAPHY:  This was performed in the RAO cranial view using 20 cc of Omnipaque dye at 20 cc/sec.  The aortic arch was intact without dissection.  All great vessels were intact.  OVERALL IMPRESSION:  [name] has essentially normal coronary arteries, normal left ventricular function, and no evidence of aortic dissection.  I believe her chest pain is noncardiac and her exercise stress test is false-positive.  PLAN:  Treat empirically for reflux and/or musculoskeletal etiology.  An ACT was measured and the sheaths were removed.  Pressure was held on the groin to achieve hemostasis.  The patient left the laboratory in stable condition.  She will be discharged home in the morning and will followup with [facility]. Dictated by:   Runell Gess, M.D. Attending Physician:  Garnette Scheuermann DD:  02/25/01 TD:  02/27/01 Job: [id number]

## 2010-06-13 NOTE — Consult Note (Signed)
NAMETRINNA, KUNST NO.:  [id number]   MEDICAL RECORD NO.:  [id number]          PATIENT TYPE:  INP   LOCATION:  4733                         FACILITY:  [facility]   PHYSICIAN:  Ulyses Amor, MD DATE OF BIRTH:  12/21/1945   DATE OF CONSULTATION:  04/25/2006  DATE OF DISCHARGE:                                 CONSULTATION   REASON FOR CONSULTATION:  [name] is a 65 year old white woman  who is admitted to [facility] for further evaluation of chest  pain.   The patient has a history of chronic chest pain for the last 5 years.  She experiences episodes approximately three times a week.  Episodes  occur at random and appear to be unrelated to position, activity, meals,  or respirations.  She underwent cardiac catheterization at the onset of  these episodes 5 years ago; this demonstrated no coronary artery  disease.  She has continued to experience these episodes.  Today, she  experienced an episode which was somewhat more intense than usual.  The  chest pain is described as an ache in the interscapular area, substernal  area, and left arm.  It occurred while she was cleaning dishes.  It was  not associated with dyspnea, diaphoresis, or nausea.  There were no  exacerbating or ameliorating factors.  It appeared not to be related to  position, activity, meals, or respirations.  Other than the intensity,  it was in no way different in character or quality than her typical  chest pain.  She presented to the emergency department and was  subsequently admitted to the hospital.  Her chest pain did not respond  to nitroglycerin which was given to her in the hospital.  Her chest pain  is still present though has largely subsided.   The patient has no history of heart disease, including no history of  coronary artery disease, myocardial infarction, congestive heart  failure, or arrhythmia.  There is no history of hypertension,  dyslipidemia, diabetes  mellitus, or smoking.  There is a family history  of coronary artery disease (father).  The patient has a number of other  medical problems.  These include asthma, migraine headaches, and  irritable bowel syndrome.  She also has a history of mitochondrial  myopathy.   MEDICATIONS:  Xanax, albuterol.   ALLERGIES:  NONE.   OPERATIONS:  Vein stripping, appendectomy.   SOCIAL HISTORY:  The patient does not work.  She lives with her husband.  She neither smokes cigarettes nor drinks alcohol.   REVIEW OF SYSTEMS:  History of collapsed lung and pericarditis.  Review  of systems otherwise reveals no new problems related to her head, eyes,  ears, nose, mouth, throat, lungs, gastrointestinal system, genitourinary  system, or extremities.  There is no history of neurologic or  psychiatric disorder.  There is no history of fever, chills, or weight  loss.   PHYSICAL EXAMINATION:  VITAL SIGNS:  Blood pressure 110/64, pulse 73 and  regular, respirations 20, temperature 97.9.  GENERAL:  The patient was a middle-aged white woman in no discomfort.  She was alert, oriented, appropriate,  and responsive.  HEAD, EYES, EARS, NOSE, MOUTH AND THROAT:  Were normal.  NECK:  Was without thyromegaly or adenopathy.  Carotid pulses were  palpable bilaterally and without bruit.  CARDIAC:  Examination reveals a normal S1 and S2.  There was no S3, S4,  murmur, rub, or click.  Cardiac rhythm was regular.  No chest wall  tenderness was noted.  LUNGS:  The lungs were clear.  ABDOMEN:  The abdomen was soft and nontender.  There was no mass,  hepatosplenomegaly, bruit, distention, rebound, guarding, or rigidity.  Bowel sounds were normal.  BREASTS, PELVIC, AND RECTAL:  Examinations were not performed as they  were not pertinent to the reason for acute care hospitalization.  EXTREMITIES:  Without edema, deviation, or deformity.  Radial and  dorsalis pedal pulses were palpable bilaterally.  NEUROLOGICAL:  Brief  screening neurologic survey was unremarkable.   STUDIES:  Electrocardiogram revealed T-wave inversion in the  anterolateral leads (this T-wave inversion was present on her  electrocardiogram back in 2003 at the time of her normal cardiac  catheterization; it was possibly felt to be due to her mitochondrial  myopathy).  The chest radiograph and chest CT demonstrated no evidence  of acute cardiopulmonary abnormality.  The initial set of cardiac  markers revealed a myoglobin of 57.8, CK-MB 5.9, and troponin less than  0.05.  White count was 6.1 with a hemoglobin of 14.9 and hematocrit of  43.8.  Potassium was 3.7 with a BUN of 9 and creatinine 0.57.  The  remaining studies were pending at the time of this dictation.   IMPRESSION:  1. Chronic chest pain syndrome.  Episodes three times weekly for the      last 5 years.  The episode today was somewhat more intense than her      usual episodes, but was otherwise no different in character or      quality.  A cardiac catheterization 5 years ago demonstrated no      coronary artery disease.  2. Mitochondrial myopathy.  3. Asthma.  4. Migraine headaches.   RECOMMENDATIONS:  1. Telemetry.  2. Serial cardiac enzymes.  3. Aspirin.  4. Intravenous heparin.  5. Intravenous nitroglycerin.  6. Further measures per Dr. Elsie Lincoln.      Ulyses Amor, MD  Electronically Signed     MSC/MEDQ  D:  04/25/2006  T:  04/26/2006  Job:  [id number]   cc:   [name], M.D.

## 2010-06-13 NOTE — H&P (Signed)
NAMEJASALYN, [name]            ACCOUNT NO.:  [id number]   MEDICAL RECORD NO.:  [id number]          PATIENT TYPE:  INP   LOCATION:  4733                         FACILITY:  [facility]   PHYSICIAN:  Madeleine B. Vanstory, M.D.DATE OF BIRTH:  1945/08/03   DATE OF ADMISSION:  04/25/2006  DATE OF DISCHARGE:                              HISTORY & PHYSICAL   ADMISSION DIAGNOSIS:  Chest pain.   HISTORY OF PRESENT ILLNESS:  This is a 65 year old female with a 1-day  history of what initially started as back pain that became intermittent  chest pain with bilateral radiation down her arms.  It lasted all day,  again intermittent but with no association specifically with rest or  exertion occurred with this.  Patient with underlying shortness of  breath/dyspnea on exertion at baseline given her history of  mitochondrial myopathy, but her dyspnea on exertion was above baseline  today.  No diaphoresis.  The patient also with a history of  pericarditis, which she states the sensation feels similar to.  No  history of gastritis or ulcer.   FAMILY HISTORY:  Father with MI at 20.   PAST MEDICAL HISTORY:  1. Mitochondrial myopathy, diagnosed at [facility], but no specific      mitochondrial disease identified.  2. A history of migraines.  3. A history of asthma, mild, intermittent, stable, no recent      exacerbations.  4. A history of hemorrhoids.  5. A history of IBS.  6. A history of Valsalva abnormal EKG changes with stress test and a      normal catheterization in 2003 by Dr. Elsie Lincoln.  7. Pericarditis.  8. Collapsed lung status post pleurocentesis.  9. Appendectomy for non-surgical abdomen and no appendicitis.   ALLERGIES:  NO KNOWN DRUG ALLERGIES.   MEDICATIONS:  1. Xanax 0.25 mg p.o. q.h.s. as a sleep aid.  2. Hormone replacement therapy, unknown name or dose.   SOCIAL HISTORY:  The patient is married, a nonsmoker.  She designs high-  end kitchens.   FAMILY HISTORY:  Father with an MI at  74 but was a 5-pack-per-day  smoker, mother with asthma, brother with polio.   PHYSICAL EXAM:  VITALS:  All within normal limits.  Blood pressure  113/72, heart rate 66, afebrile.  GENERAL:  This is a trim Caucasian female in no acute distress, alert  and oriented x3.  HEENT:  Pupils equal, round, reactive to light.  Extraocular muscles  intact.  Head:  Normocephalic, atraumatic.  CV:  Regular rate and rhythm.  No murmurs, rubs, or gallops.  No  tenderness to palpation over the chest wall.  RESPIRATORY:  Clear to auscultation bilaterally, nasal cannula O2 on,  patient able to speak in full sentences.  No respiratory distress or  retractions.  ABDOMEN:  Soft, nontender, nondistended, positive bowel sounds.  EXTREMITIES:  No edema bilaterally.  No tenderness to palpation in  bilateral calves.  Right leg with ecchymosis and erythema at recent vein  stripping site.  Upper extremities:  Good grip strength and 5+ strength  bilaterally with rapid flexion.  NEURO:  Nonfocal.   LABS:  Point-of-care troponin negative.  CT angio of the chest was  negative.  White count 6.1, hemoglobin 14.9, BMET within normal limits.   ASSESSMENT AND PLAN:  This 65 year old female admitted with intermittent  chest pain.  1. Chest pain concerning for cardiac etiology, though no known risk      factors other than family medicine, though patient has not recently      seen a primary medical doctor, both unknown lipid status.  Blood      pressure usually runs 90s over 60s.  A history of Valsalva stress      test with a normal catheterization in 2003, also a history of      pericarditis.  The differential includes pericarditis versus      myocardial infarction, esophageal etiology, although unlikely,      musculoskeletal less likely, anxiety component, history does not      seem consistent with this, possible pleurodynia, not necessarily      increased chest pain with deep inspiration, pulmonary embolus or       dissection, essentially ruled out given negative CT.  Will admit      for 23-hour observation to a telemetry bed, cycle enzymes x3, order      a 2-D echocardiogram given her mitochondrial myopathy for further      evaluation of her heart architecture.  With a history of a pinched      nerve and bilateral numbness and tingling, consider cervical      disease as well though further down on a differential.  2. Mitochondrial myopathy.  Worked up [employer] by a neurologist, was seen      to have abnormal electromyograms, rare disease with baseline      fatigue but fatigue greater than normal.  We will check a TSH,      consider checking a B12 if tingling or weakness persists.  3. Asthma, stable, mild, intermittent.  Will provide albuterol metered-      dose inhaler p.r.n.  4. Health maintenance.  We will get a fasting lipid panel.  Recommend      outpatient mammogram if not done.  We will also check a CBC.      Tawnya Crook Erenest Rasher, M.D.     MBV/MEDQ  D:  04/25/2006  T:  04/26/2006  Job:  [id number]

## 2010-06-13 NOTE — Discharge Summary (Signed)
[name], [name]            ACCOUNT NO.:  [id number]   MEDICAL RECORD NO.:  [id number]          PATIENT TYPE:  INP   LOCATION:  4733                         FACILITY:  [facility]   PHYSICIAN:  Drue Dun, M.D.       DATE OF BIRTH:  15-Jul-1945   DATE OF ADMISSION:  04/25/2006  DATE OF DISCHARGE:  04/26/2006                               DISCHARGE SUMMARY   PRIMARY CARE PHYSICIAN:  Royal Hawthorn B. Fields, M.D., [facility].   CONSULTATIONS:  Nanetta Batty, M.D., [facility].   DISCHARGE DIAGNOSES:  1. Atypical chest pain, likely chronic.  2. Asthma  3. Mitochondrial myopathy.  4. Insomnia.   PROCEDURES:  1. CT angiogram of the chest on April 25, 2006 showed no evidence for      acute abnormality or pulmonary embolus.  2. EKG showed no acute changes as compared with prior.  3. Three-view of the thoracic spine showed mild thoracic spondylosis      and degenerative changes as well as a mild T9 superior endplate      wedging likely chronic compression, no significant height loss.   DISCHARGE MEDICATIONS:  1. Aspirin 81 mg p.o. daily.  2. Xanax 0.25 mg p.o. q.h.s. as previously prescribed.  3. Albuterol inhaler two puffs inhaled every four hours as needed as      previously prescribed.  4. Hormone replacement therapy as previously prescribed.   PERTINENT LABORATORY DATA:  Cardiac enzymes revealed mildly elevated CK-  MB of 4.3 and 6.7 with relative indices of 5.0 decreasing to 4.1.  Troponins were normal at 0.02 and 0.01.  EKG showed no acute changes.  Basic metabolic panel on the day of discharge showed a sodium of 139,  potassium of 3.2, chloride of 105, bicarb of 23, BUN of 8, creatinine of  0.63, glucose of 90, and calcium of 8.6.  The patient's potassium was  repleted prior to discharge.  LFTs showed a total bilirubin of 1.6,  alkaline phosphatase of 66, AST of 45, ALT 32, total protein 5.4, and  albumin of 2.8.  CBC showed a  normal white blood cell count of 5.2,  hemoglobin of 13.7, hematocrit of 39.8, and platelets of 144.  Fasting  lipid panel showed a total cholesterol of 180, triglycerides of 43, HDL  of 63, and LDL of 108.  TSH was mildly elevated of 9.454 which came back  after the patient was discharged.  Thus, needs follow up in the  outpatient setting.   PENDING RESULTS AND ISSUES TO BE FOLLOWED AT DISCHARGE:  1. Follow up elevated TSH which came back after the patient's      discharge.  2. Follow up results of thoracic spine films.  3. The patient is scheduled for a Persantine Myoview via cardiology in      the outpatient setting on April 27, 2006 as well as a 2-D      echocardiogram on May 03, 2006 and is to follow up with Dr. Allyson Sabal      on May 13, 2006.   BRIEF HOSPITAL COURSE:  Please see  full dictated admission history and  physical for full details of initial presentation and workup.  In brief,  this is a 65 year old white female with a history of mitochondrial  myopathy who presented with left-sided atypical chest pain.   Problem 1.  Chest pain.  This was atypical in nature with lack of  associated radiation, diaphoresis, nausea, or shortness of breath.  The  patient also gave a chronic history of recurring chest pain at least  once weekly; however, this episode was slightly more severe.  The  patient's only risk factor was a family history of MI in her father at  age 76 who was a significant smoker.  The patient's fasting lipid panel  revealed a cholesterol at goal.  Cardiology, however, was consulted  given the patient's cardiac enzymes with mildly elevated CK-MB and  relative index.  She was started on full-dose heparin per their  recommendations after obtaining a negative Hemoccult.  The patient's  heart rate and blood pressure were too low to initiate beta blocker  therapy.  She previously had cardiac workup in 2003 with a negative  catheterization.  Thus, was felt to be at  relatively low risk, and this  was felt to be likely noncardiac in origin.  The patient was discharged  with outpatient cardiac followup as mentioned above.   Problem 2.  Asthma.  The patient was continued on her home dose of  albuterol inhaler as needed and remained stable throughout admission.   Problem 3.  Mitochondrial myopathy with chronic fatigue and weakness but  slightly increased recently.  The patient has been seen at [facility] for this  diagnosis previously.   Problem 4.  Insomnia.  The patient was continued on her home dose of  Xanax q.h.s. to prevent withdrawal.   Problem 5.  Hypokalemia.  The patient's potassium was repleted prior to  discharge.   DISPOSITION:  The patient was evaluated by cardiology and felt to be at  low risk for a true cardiac event.  Thus, she was scheduled for  outpatient followup with Persantine Myoview and two-D echocardiogram, as  mentioned above.  The patient remained without chest pain for the  remainder of her admission.   DISCHARGE INSTRUCTIONS:  The patient has no activity restrictions.  The  patient is to follow a low-sodium, heart-healthy diet.   FOLLOWUP APPOINTMENTS:  1. The patient has a followup appointment with her primary care      physician, Dr. Roanna Epley at [facility] on      May 20, 2006 at 10:45 a.m.  2. The patient has Persantine Myoview on April 27, 2006 at noon.  3. The patient has a 2-D echocardiogram scheduled for May 03, 2006 at      2 p.m.  4. The patient has a followup appointment with Dr. Allyson Sabal at      [facility] on May 13, 2006 at 8:30 a.m.   The patient was discharged home with her husband in stable condition.          ______________________________  Drue Dun, M.D.    EE/MEDQ  D:  05/01/2006  T:  05/02/2006  Job:  [id number]   cc:   Royal Hawthorn B. Darrick Penna, M.D.

## 2010-08-12 ENCOUNTER — Encounter: Payer: Self-pay | Admitting: Family Medicine

## 2010-08-12 ENCOUNTER — Ambulatory Visit (INDEPENDENT_AMBULATORY_CARE_PROVIDER_SITE_OTHER): Payer: Medicare Other | Admitting: Family Medicine

## 2010-08-12 DIAGNOSIS — G729 Myopathy, unspecified: Secondary | ICD-10-CM

## 2010-08-12 MED ORDER — SUMATRIPTAN SUCCINATE 50 MG PO TABS: 50.0000 mg | ORAL_TABLET | ORAL | Status: DC | PRN

## 2010-08-12 MED ORDER — ALPRAZOLAM 0.25 MG PO TABS: 0.2500 mg | ORAL_TABLET | Freq: Every evening | ORAL | Status: DC | PRN

## 2010-08-12 NOTE — Progress Notes (Signed)
  Subjective:    Patient ID: [name], female    DOB: 02-02-1945, 65 y.o.   MRN: [id number]  HPI  Patient with diagnosis of mitochondrial myositis, here for follow up.  Has been worsening in her fatigue recently; has appt with [facility] on August 1st.  Has not gone there yet.  Has lost some hope of improvement in this condition.  Is a major struggle just to get out of her house, to walk here from her car, or to do minimal ADLs.  Was summoned to jury duty and does not feel she can do it, despite her interest in serving on the jury.  Asks for a physician's letter.  Used to have tremor, has gotten better.   Review of Systems     Objective:   Physical Exam    Alert, in moderate distress when she gets off table, stands up, sits up.   Handgrip 3/5 symmetric bilaterally.  Patellar DTRs 3+ symmetric bilaterally. Dorsi/plantarflexion full and symmetric. Proximal muscle strength (hip flexion) 3/5 symmetrically.     Assessment & Plan:

## 2010-08-12 NOTE — Assessment & Plan Note (Signed)
Patient with some clinical deterioration since last visit.  Seems down today.  I encouraged her to have hope and to set goals with [facility] consultant regarding areas which might improve.  She would like copies of EMG from Dr. Imagene Gurney office in [place], as well as the first muscle biopsy done at [facility] in 2004.  Will request her paper chart to find these things.  Letter for jury duty, which in my medical opinion is beyond her ability to serve at this time.  She has a handicapped placard already.

## 2010-08-12 NOTE — Patient Instructions (Signed)
It was a pleasure to see you today.  I will look through your paper record for the 2004 EMG with Dr Sandria Manly, and the 1st muscle biopsy with Dr Georgina Pillion at [facility].

## 2010-08-22 ENCOUNTER — Other Ambulatory Visit: Payer: Medicare Other | Admitting: Internal Medicine

## 2010-08-25 ENCOUNTER — Ambulatory Visit: Payer: Medicare Other | Admitting: Internal Medicine

## 2010-09-04 ENCOUNTER — Telehealth: Payer: Self-pay | Admitting: Family Medicine

## 2010-09-04 NOTE — Telephone Encounter (Signed)
I called patient back.  She has history of administering her own B12 injections IM in the past for three consecutive years.  Husband is uneasy about this.  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  We discussed that B12 is water soluble and therefore excess is excreted in urine; discussed possible signs of injection site infection.  She should contact me with any problems or concerns with this plan.

## 2010-09-04 NOTE — Telephone Encounter (Signed)
Pt went to [facility] on 8/1 and they have sent info that she needs to be on B12 regime.  Would like to talk to Dr Mauricio Po about this visit and see what he thinks, she has questions about whether she can administer this herself.

## 2010-09-18 ENCOUNTER — Ambulatory Visit (INDEPENDENT_AMBULATORY_CARE_PROVIDER_SITE_OTHER): Payer: Medicare Other | Admitting: Home Health Services

## 2010-09-18 ENCOUNTER — Encounter: Payer: Self-pay | Admitting: Home Health Services

## 2010-09-18 VITALS — BP 119/72 | HR 88 | Temp 97.0°F | Ht 63.0 in | Wt 139.8 lb

## 2010-09-18 DIAGNOSIS — Z0000 Encounter for general adult medical examination without abnormal findings: Secondary | ICD-10-CM

## 2010-09-18 NOTE — Patient Instructions (Signed)
1. Continue to work with trainer and try to include if possible, more movement into your daily life. 2. Consider new things you can do with where you body is at now.  3. Consider exploring your wheel of life and how balanced you feel. 4. Complete Living Will and bring a copy to Dr. Mauricio Po.

## 2010-09-18 NOTE — Progress Notes (Signed)
Patient here for annual wellness visit, patient reports: Risk Factors/Conditions needing evaluation or treatment: Pt does not have any risk factors that need evaluation.  Pt does reports feeling tired from Mitochondrial Myopathy condition. Home Safety: Pt lives with husband in 2 story home. Pt reports having smoke detectors and adaptive equipment in bathroom. Other Information: Corrective lens: Pt wears daily corrective lens and visits eye doctor 1x year. Dentures: Pt does not have dentures. Memory: Pt denied memory problems. Patient's Mini Mental Score (recorded in doc. flowsheet): 30  Balance/Gait: Pt does not have any noticeable impairments; however pt reports having to be very intentional when walking to avoid falling.  Balance Abnormal Patient value  Sitting balance    Sit to stand    Attempts to arise    Immediate standing balance    Standing balance    Nudge    Eyes closed- Romberg    Tandem stance    Back lean    Neck Rotation    360 degree turn    Sitting down     Gait Abnormal Patient value  Initiation of gait    Step length-left    Step length-right    Step height-left    Step height-right    Step symmetry    Step continuity    Path deviation    Trunk movement    Walking stance        Annual Wellness Visit Requirements Recorded Today In  Medical, family, social history Past Medical, Family, Social History Section  Current providers Care team  Current medications Medications  Wt, BP, Ht, BMI Vital signs  Hearing assessment (welcome visit) Hearing/vision  Tobacco, alcohol, illicit drug use History  ADL Nurse Assessment  Depression Screening Nurse Assessment  Cognitive impairment Nurse Assessment  Mini Mental Status Document Flowsheet  Fall Risk Nurse Assessment  Home Safety Progress Note  End of Life Planning (welcome visit) Social Documentation  Medicare preventative services Progress Note  Risk factors/conditions needing evaluation/treatment Progress  Note  Personalized health advice Patient Instructions, goals, letter  Diet & Exercise Social Documentation  Emergency Contact Social Documentation  Seat Belts Social Documentation  Sun exposure/protection Social Documentation    Prevention Plan: Pt reports being up to date on all recommended screenings.   Recommended Medicare Prevention Screenings Women over 59 Test For Frequency Date of Last- BOLD if needed  Breast Cancer 1-2 yrs 2009  Cervical Cancer 1-3 yrs 2009  Colorectal Cancer 1-10 yrs 2003  Osteoporosis once Pt reported done  Cholesterol 5 yrs 2010  Diabetes yearly Non diabetic  HIV yearly declined  Influenza Shot yearly 2011  Pneumonia Shot once Pt reported done  Zostavax Shot once Pt reported done

## 2010-09-24 ENCOUNTER — Encounter: Payer: Self-pay | Admitting: Home Health Services

## 2010-09-24 NOTE — Progress Notes (Signed)
  Subjective:    Patient ID: [name], female    DOB: Jan 12, 1946, 65 y.o.   MRN: [id number]  HPI    Review of Systems     Objective:   Physical Exam   I have reviewed this visit and discussed with Arlys John and agree with her documentation.       Assessment & Plan:

## 2010-09-28 ENCOUNTER — Other Ambulatory Visit: Payer: Self-pay | Admitting: Family Medicine

## 2010-09-28 ENCOUNTER — Encounter: Payer: Self-pay | Admitting: Family Medicine

## 2010-09-28 ENCOUNTER — Emergency Department (HOSPITAL_COMMUNITY): Payer: Medicare Other

## 2010-09-28 ENCOUNTER — Emergency Department (HOSPITAL_COMMUNITY)
Admission: EM | Admit: 2010-09-28 | Discharge: 2010-09-28 | Disposition: A | Discharge status: Home / Self Care | Payer: Medicare Other | Attending: Emergency Medicine | Admitting: Emergency Medicine

## 2010-09-28 DIAGNOSIS — R079 Chest pain, unspecified: Secondary | ICD-10-CM | POA: Insufficient documentation

## 2010-09-28 DIAGNOSIS — Z79899 Other long term (current) drug therapy: Secondary | ICD-10-CM | POA: Insufficient documentation

## 2010-09-28 LAB — DIFFERENTIAL
Basophils Absolute: 0 10*3/uL (ref 0.0–0.1)
Basophils Relative: 1 % (ref 0–1)
Eosinophils Absolute: 0.1 10*3/uL (ref 0.0–0.7)
Eosinophils Relative: 2 % (ref 0–5)
Lymphocytes Relative: 19 % (ref 12–46)
Lymphs Abs: 1.5 10*3/uL (ref 0.7–4.0)
Monocytes Absolute: 0.6 10*3/uL (ref 0.1–1.0)
Monocytes Relative: 8 % (ref 3–12)
Neutro Abs: 5.7 10*3/uL (ref 1.7–7.7)
Neutrophils Relative %: 71 % (ref 43–77)

## 2010-09-28 LAB — BASIC METABOLIC PANEL
BUN: 8 mg/dL (ref 6–23)
CO2: 29 mEq/L (ref 19–32)
Calcium: 9.4 mg/dL (ref 8.4–10.5)
Chloride: 101 mEq/L (ref 96–112)
Creatinine, Ser: 0.54 mg/dL (ref 0.50–1.10)
GFR calc Af Amer: >60 mL/min (ref >60)
GFR calc non Af Amer: >60 mL/min (ref >60)
Glucose, Bld: 88 mg/dL (ref 70–99)
Potassium: 4.1 mEq/L (ref 3.5–5.1)
Sodium: 139 mEq/L (ref 135–145)

## 2010-09-28 LAB — CBC
HCT: 47.3 % — ABNORMAL HIGH (ref 36.0–46.0)
Hemoglobin: 17.2 g/dL — ABNORMAL HIGH (ref 12.0–15.0)
MCH: 37.7 pg — ABNORMAL HIGH (ref 26.0–34.0)
MCHC: 36.4 g/dL — ABNORMAL HIGH (ref 30.0–36.0)
MCV: 103.7 fL — ABNORMAL HIGH (ref 78.0–100.0)
Platelets: 182 10*3/uL (ref 150–400)
RBC: 4.56 MIL/uL (ref 3.87–5.11)
RDW: 12.4 % (ref 11.5–15.5)
WBC: 8 10*3/uL (ref 4.0–10.5)

## 2010-09-28 LAB — CK TOTAL AND CKMB (NOT AT ARMC)
CK, MB: 3.8 ng/mL (ref 0.3–4.0)
CK, MB: 3 ng/mL (ref 0.3–4.0)
Relative Index: INVALID (ref 0.0–2.5)
Relative Index: INVALID (ref 0.0–2.5)
Total CK: 69 U/L (ref 7–177)
Total CK: 53 U/L (ref 7–177)

## 2010-09-28 LAB — TROPONIN I
Troponin I: <0.3 ng/mL (ref <0.30)
Troponin I: <0.3 ng/mL (ref <0.30)

## 2010-09-28 LAB — POCT I-STAT TROPONIN I: Troponin i, poc: 0 ng/mL (ref 0.00–0.08)

## 2010-09-28 MED ORDER — DICYCLOMINE HCL 20 MG PO TABS: 20.0000 mg | ORAL_TABLET | Freq: Four times a day (QID) | ORAL | Status: DC | PRN

## 2010-09-28 NOTE — H&P (Addendum)
Family Medicine Teaching Service ED Consultation  Patient name: [name] Medical record number: [id number] Date of birth: 08-11-1945 Age: 65 y.o. Gender: female  Primary Care Provider: Barbaraann Barthel, MD  Chief Complaint: chest pain  History of Present Illness: [name] is a 65 y.o. year old female presenting with chest pain. Started last night around 3-3:30am. Squeezing chest pain over left chest every 3 minutes and lasting 5-10 seconds. Chest pain is not worse now (at noon on 09/02) than it was when it started. Non-exertional. Occurs at rest. Pain does not radiate. NTG did not help. Patient refused morphine. Not associated with nausea/vomiting/diarphoresis.   Patient has a history of mitochondrial myopathy presenting as generalized weakness and dyspnea on exertion. She has been seen by specialists at [facility] and recently this month with Neurology at [facility] who deemed her condition stable per the patient. Patient has also been seen by a Cardiologist extensively in the past, 5-6 years ago due to abnormal ECGs. She had a full cardiac work-up by her Cardiologist Dr. Allyson Sabal, including a cardiac catheterization, which was negative. No longer follows-up with them as her condition was considered stable and not requiring any further intervention.   Patient Active Problem List  Diagnoses  . PERNICIOUS ANEMIA  . ANXIETY  . TREMOR, ESSENTIAL/FAMILIAL  . MIGRAINE HEADACHE  . UNSPECIFIED MYOPATHY  . ASTHMA, UNSPECIFIED  . IRRITABLE BOWEL SYNDROME  . MENOPAUSAL SYNDROME  . ILIOTIBIAL BAND SYNDROME, LEFT KNEE  . INSOMNIA UNSPECIFIED  . METATARSALGIA  . IDIOPATHIC OSTEOPOROSIS  . Personal history of endocrine/metabolic/immunity disorder  . Encounter for long-term (current) use of medications   Past Medical History: History of infection of pericardium and lung collapse several years ago. Cause unclear. Patient thinks it was due to over-exerting herself.    Past Surgical  History: Past Surgical History  Procedure Date  . Appendectomy   . Cesarean section     Social History: History   Social History  . Marital Status: Married    Spouse Name: Richard    Number of Children: 2  . Years of Education: Grad   Occupational History  . Environmental manager    Social History Main Topics  . Smoking status: Never Smoker   . Smokeless tobacco: Never Used  . Alcohol Use: 0.5 oz/week    1 drink(s) per week  . Drug Use: No  . Sexually Active: Not on file   Other Topics Concern  . Not on file   Social History Narrative   Health Care POA: Emergency Contact: husband, Truman Hayward, (c) [phone] of Life Plan: Who lives with you: husbandAny pets: noneDiet: Pt has a varied diet.  Eats 5 sm. meals throughout day, focuses on protein, doesn't care for fruits and vegetables very much.Exercise: Pt has a personal training and exercises several times a week.Seatbelts: Pt reports wearing seatbelt when in vehicle.[name] Exposure/Protection: Pt reports wearing sun protection. Hobbies: reading, visiting with friends    Family History: Family History  Problem Relation Age of Onset  . Asthma Mother   . Cancer Father   . Emphysema Father   Father smoked extensively throughout his life.  Allergies:   Current Outpatient Prescriptions  Medication Sig Dispense Refill  . albuterol (VENTOLIN HFA) 108 (90 BASE) MCG/ACT inhaler Inhale 2 puffs into the lungs every 4 (four) hours as needed. For shortness of breath. Dispense most economical albuterol HFA       . ALPRAZolam (XANAX) 0.25 MG tablet Take 1 tablet (0.25 mg total) by  mouth at bedtime as needed for anxiety.  90 tablet  3  . estradiol (VIVELLE-DOT) 0.025 MG/24HR Change patch twice weekly. (1/2 patch at each dose)       . progesterone (PROMETRIUM) 100 MG capsule Take 100 mg by mouth daily.        . SUMAtriptan (IMITREX) 50 MG tablet Take 1 tablet (50 mg total) by mouth every 2 (two) hours as needed for migraine. At the onset  of the HA, may repeat in one hour (do not exceed 200mg  in 24 hours) as needed  10 tablet  6   Review Of Systems: Per HPI with the following additions: patient has history of chronic diarrhea, which is unchanged from her baseline; no other GI symptoms, including abdominal pain or bleeding. No dysuria or blood in stool. No headaches, dizziness, new weakness, vision changes. No wheezing or dyspnea. Otherwise 12 point review of systems was performed and was unremarkable.  Physical Exam: Pulse: 82  Blood Pressure: 104/58 RR: 16   O2: 96 on RA Temp: 97.6  General: alert, cooperative, appears stated age and no distress HEENT: PERRLA, extra ocular movement intact, sclera clear, anicteric, oropharynx clear, no lesions and neck supple with midline trachea Heart: S1, S2 normal, no murmur, rub or gallop, regular rate and rhythm, S1 and S2 normal CV: palpable and symmetric radial, femoral, and pedal pulses Lungs: clear to auscultation, no wheezes or rales and unlabored breathing Abdomen: abdomen is soft without significant tenderness, masses, organomegaly or guarding Extremities: extremities normal, atraumatic, no cyanosis or edema and Homans sign is negative, no sign of DVT Skin:no rashes Neurology: normal without focal findings, mental status, speech normal, alert and oriented x3, PERLA, cranial nerves 2-12 intact, reflexes normal and symmetric, sensation grossly normal, finger to nose and cerebellar exam normal and strength is 4/5 strength in upper and lower extremities  Labs and Imaging: Lab Results  Component Value Date/Time   NA 139 09/28/2010  7:37 AM   K 4.1 09/28/2010  7:37 AM   CL 101 09/28/2010  7:37 AM   CO2 29 09/28/2010  7:37 AM   BUN 8 09/28/2010  7:37 AM   CREATININE 0.54 09/28/2010  7:37 AM   GLUCOSE 88 09/28/2010  7:37 AM   Lab Results  Component Value Date   WBC 8.0 09/28/2010   HGB 17.2* 09/28/2010   HCT 47.3* 09/28/2010   MCV 103.7* 09/28/2010   PLT 182 09/28/2010   Troponin @ 0900 and @ 12:30:  <0.30  ECG @ 0700: NSR with persistent T-wave inversions in anterior leads as seen on previous ECG in 2008  CXR: no acute findings   Assessment and Plan: [name] is a 65 y.o. year old female presenting with chest discomfort for the past several hours.  Cardiac enzymes negative x2 and no new ECG changes. Initially wanted to admit her for observation, but patient strongly requesting to go home. Symptom does not appear cardiac in origin based on presentation and exam. Splenic flexure syndrome (intestinal spasms) possible, despite no new GI symptoms. Offered anti-spasmodic but patient denied. Aortic dissection also on differential and would not want to miss, but unlikely based on presentation and normal pulses. Other real possibility is that symptom associated with her myopathy. Think it would be appropriate to discharge her home with close follow-up with her previous Cardiologist, Dr. Allyson Sabal, for further Cardiology work-up and with PCP Dr. Mauricio Po. Will forward a copy of dictation to both providers. Patient seems educated about her health, and she had  good support from her husband who stayed with her during her ED visit. Patient given red flags to return to the ED or to call PCP, including signs and symptoms of heart attack.  Addendum: Husband would like Rx for anti-spasmodic to be sent in just in case. Will send Rx to pharmacy for dicyclomine 20mg  qid prn.

## 2010-09-28 NOTE — Assessment & Plan Note (Signed)
See ED consultation note under "Admission Note" for 09/02.  Will send Rx for possible intestinal spasm contributing to left chest squeezing sensation felt.

## 2010-09-30 ENCOUNTER — Telehealth: Payer: Self-pay | Admitting: Family Medicine

## 2010-09-30 NOTE — Telephone Encounter (Signed)
Called pt. Pt has appt with her cardiologist 11-03-10. Fwd. To Dr.Breen for info. (Dr.Breen you don't have to call the pt ) .Arlyss Repress

## 2010-09-30 NOTE — Telephone Encounter (Signed)
Went to ER on Sunday with Heart pain.  Was seen by Madolyn Frieze and another MD from [facility].  She was told to make Dr. Mauricio Po aware.  She was released and was told to contact her Cardiologist - Regulatory affairs officer and she has attempted to call but has not been able to get through.

## 2010-10-01 NOTE — Telephone Encounter (Signed)
Thanks for the notification. [name] O

## 2010-10-21 NOTE — Consult Note (Signed)
[name], [name] NO.:  [id number]  MEDICAL RECORD NO.:  [id number]  LOCATION:  [facility]                         FACILITY:  [facility]  PHYSICIAN:  Pearlean Brownie, M.D.DATE OF BIRTH:  [birth year]  DATE OF CONSULTATION:  09/28/2010 DATE OF DISCHARGE:  09/28/2010                                CONSULTATION   TIME OF CONSULTATION:  12:30 p.m.  PRIMARY CARE PROVIDER:  Paula Compton, MD at [facility]  CHIEF COMPLAINT:  Chest pain.  HISTORY OF PRESENT ILLNESS:  [name] is a 65 year old female presenting with chest pain.  Started last night around 3 to 3:30 a.m. Squeezing chest pain over the left chest occurring every 3 minutes and lasting 5-10 seconds.  Chest pain is not worse at the time of this interview, then it was when it started.  Nonexertional.  Occurs at rest. The pain does not radiate.  Nitroglycerin did not help.  The patient refused morphine in the ED.  Not associated with nausea, vomiting, or diaphoresis.  The patient has a history of mitochondrial myopathy, presenting with generalized weakness and dyspnea on exertion.  She has been seen by a specialist at [facility] and recently this month with the neurologist at [facility] who deemed her condition stable.  The patient has also been seen by a cardiologist, Dr. Allyson Sabal extensively in the past about 5-6 years ago, due to abnormal ECGs.  The patient had a full cardiac workup by Dr. Allyson Sabal including an cardiac catheterization which was negative.  She no longer follows up with them as her condition was considered stable and not requiring any further intervention.  PAST MEDICAL HISTORY: 1. Pernicious anemia. 2. Anxiety. 3. Essential familial tremor. 4. Migraine headaches. 5. Unspecified myopathy. 6. Asthma. 7. Irritable bowel syndrome. 8. Menopausal syndrome. 9. Iliotibial band syndrome in the left knee. 10.Insomnia.  PAST SURGICAL HISTORY: 1. Appendectomy. 2.  C-section.  SOCIAL HISTORY:  The patient lives with her husband in [place].  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  Never smoked. Infrequent alcohol use.  No other drugs.  FAMILY HISTORY:  Significant for asthma in her mother and cancer and emphysema in her father who smoked extensively throughout his life.  CURRENT MEDICATIONS: 1. Xanax 0.25 mg nightly p.r.n. anxiety. 2. Estradiol 0.025 mg per 24-hour patch, half a patch biweekly. 3. Progesterone 100 mg p.o. daily.  REVIEW OF SYSTEMS:  Per HPI with the following addition.  The patient has a history of chronic diarrhea, which is unchanged from her baseline. No other GI symptoms including abdominal pain or bleeding.  No dysuria. No headaches, dizziness, new weakness or vision changes.  No wheezing or dyspnea.  PHYSICAL EXAMINATION:  VITAL SIGNS:  Temperature 97.6, heart rate 82, blood pressure 104/58, respiratory rate 16, satting 96% on room air. GENERAL:  Alert, cooperative, appears stated age in no distress. HEENT:  Pupils equal, round, react to light, extraocular movements intact, sclera clear, oropharynx clear.  No lesions. NECK:  Supple with midline trachea. HEART:  S1, S2 normal.  No murmurs, rubs, or gallops.  Regular rate and rhythm. CARDIOVASCULAR:  Palpable and symmetric radial, femoral, and pedal pulses. LUNGS:  Clear to auscultation bilaterally  with no wheezes, rales, or rhonchi.  No increased work of breathing. ABDOMEN:  Soft, nontender, nondistended.  Normoactive bowel sounds. EXTREMITIES:  Normal, atraumatic.  No cyanosis or edema and Homans signs negative with no signs of DVT. SKIN:  No rashes. NEUROLOGY:  Normal without focal findings.  Mental status and speech normal, alert and oriented, cranial nerves grossly intact, reflexes normal and symmetric, sensation grossly intact, finger-to-nose and cerebellar exam normal, strength is 4/5 in upper and lower extremities.  LABS AND IMAGING:  BMET:   Sodium 139, potassium 4.1, chloride 101, bicarb 29, BUN 8, creatinine 0.54, glucose 88.  CBC:  White blood count 8.0, hemoglobin 17.2, platelets 182.  Troponin x2 negative.  EKG shows normal sinus rhythm with persistent T-wave inversions in anterior leads as previously seen in 2008 EKG.  Chest x-ray, no acute findings.  ASSESSMENT AND PLAN:  [name] is 65 year old female with a history of mitochondrial myositis presenting with chest discomfort for the past several hours.  ACS has been ruled out with negative cardiac enzymes x2 and no EKG changes.  We initially wanted to admit her for observation, but the patient strongly requesting to go home.  Her symptoms does not appear to be cardiac in origin based on her presentation and exam. Splenic flexure syndrome with intestinal spasms are possible despite no GI symptoms.  The patient was given a prescription for an antispasmodic just in case.  Aortic dissection also on differential and would not want to miss, but unlikely based on her presentation and normal pulses. Other real possibility of symptoms are associated with her myopathy.  I think it would be appropriate to discharge her home with close followup with her previous cardiologist, Dr. Allyson Sabal for cardiology workup with her PCP, Dr. Mauricio Po.  The patient seems educated about her health and she has good support from her husband who stayed with her during her ED visit. The patient was given red flags to return to the ED or to call her PCP including signs and symptoms of a heart attack.  The patient was asked to follow up with Dr. Allyson Sabal as soon as conveniently possible and to schedule an appointment with Dr. Mauricio Po after her cardiology appointment.  The patient was comfortable going home and was discharged to home in stable and medical condition.    ______________________________ Priscella Mann, MD   ______________________________ Pearlean Brownie, M.D.    AO/MEDQ  D:   09/28/2010  T:  09/29/2010  Job:  [id number]  Electronically Signed by Priscella Mann MD on 10/09/2010 11:09:56 AM Electronically Signed by Pearlean Brownie M.D. on 10/21/2010 11:22:58 AM

## 2010-12-25 HISTORY — PX: US ECHOCARDIOGRAPHY: HXRAD669

## 2011-02-11 ENCOUNTER — Encounter: Payer: Self-pay | Admitting: Family Medicine

## 2011-04-07 ENCOUNTER — Encounter: Payer: Self-pay | Admitting: Family Medicine

## 2011-04-07 ENCOUNTER — Ambulatory Visit (INDEPENDENT_AMBULATORY_CARE_PROVIDER_SITE_OTHER): Payer: Medicare Other | Admitting: Family Medicine

## 2011-04-07 DIAGNOSIS — R079 Chest pain, unspecified: Secondary | ICD-10-CM

## 2011-04-07 DIAGNOSIS — G4700 Insomnia, unspecified: Secondary | ICD-10-CM

## 2011-04-07 DIAGNOSIS — Z862 Personal history of diseases of the blood and blood-forming organs and certain disorders involving the immune mechanism: Secondary | ICD-10-CM

## 2011-04-07 DIAGNOSIS — K589 Irritable bowel syndrome without diarrhea: Secondary | ICD-10-CM

## 2011-04-07 DIAGNOSIS — IMO0002 Reserved for concepts with insufficient information to code with codable children: Secondary | ICD-10-CM

## 2011-04-07 MED ORDER — DICYCLOMINE HCL 20 MG PO TABS: 20.0000 mg | ORAL_TABLET | Freq: Four times a day (QID) | ORAL | Status: DC | PRN

## 2011-04-07 MED ORDER — ALPRAZOLAM 0.25 MG PO TABS: 0.2500 mg | ORAL_TABLET | Freq: Every evening | ORAL | Status: DC | PRN

## 2011-04-08 NOTE — Assessment & Plan Note (Signed)
Patient has done well with low dose alprazolam nightly; she has not needed to escalate the dose.  Will refill, discussed physiologic habituation and the tendency to need higher doses for same effect.  She doesn't believe this is happening at this time, but will keep an eye out for this phenomenon.

## 2011-04-08 NOTE — Assessment & Plan Note (Signed)
Is getting relief from acupuncture, Dr. Danton Clap.  Is not sure she will be able to return to [facility] where her original diagnosis was made.

## 2011-04-08 NOTE — Progress Notes (Signed)
  Subjective:    Patient ID: [name], female    DOB: [birth year], 66 y.o.   MRN: [id number]  HPI Comes to discuss refills of alprazolam, which she takes at bedtime for sleep initiation.  Has been taking nightly; occasionally (three times a month) will take an extra 1/2 tab when her husband's sleep problems make it harder for her to sleep.  This does well, no need to increase dose.    She has had waxing and waning of her myositis.  Some days she feels well enough to engage in social activities, other days (like today) she feels very weak and ill.  Some diarrhea today.  No fevers or chills.  Has noticed some improvement with weakness since going to [place] for acupuncture (on [address] in [place]).  She has been pleased with the results; may have 6 or 7 good days for every 4 bad days.  Was not pleased with the consult at [facility].  Had come to believe that "I will have to get used to living a diminished life".     Review of Systems See HPI    Objective:   Physical Exam Alert, in no acute distress. Somewhat slow to get up from supine position.  ABD Soft, mild diffuse tenderness without point tenderness or masses/megaly.       Assessment & Plan:

## 2011-05-25 ENCOUNTER — Other Ambulatory Visit: Payer: Self-pay | Admitting: Family Medicine

## 2011-05-25 MED ORDER — ALBUTEROL SULFATE HFA 108 (90 BASE) MCG/ACT IN AERS: 2.0000 | INHALATION_SPRAY | RESPIRATORY_TRACT | Status: DC | PRN

## 2011-06-03 ENCOUNTER — Ambulatory Visit (INDEPENDENT_AMBULATORY_CARE_PROVIDER_SITE_OTHER): Payer: Medicare Other | Admitting: Sports Medicine

## 2011-06-03 VITALS — BP 117/76

## 2011-06-03 DIAGNOSIS — M545 Low back pain, unspecified: Secondary | ICD-10-CM | POA: Insufficient documentation

## 2011-06-03 DIAGNOSIS — M549 Dorsalgia, unspecified: Secondary | ICD-10-CM | POA: Insufficient documentation

## 2011-06-03 MED ORDER — HYDROCODONE-ACETAMINOPHEN 5-500 MG PO TABS: 1.0000 | ORAL_TABLET | Freq: Four times a day (QID) | ORAL | Status: AC | PRN

## 2011-06-03 NOTE — Patient Instructions (Signed)
Ice your back for the next few days. Then use ice/heat whatever makes you feel most comfortable.  Take Vicodin 5/500 as needed. Take 1/2 tablet at first. May cause constipation or drowsiness.

## 2011-06-03 NOTE — Progress Notes (Signed)
  Subjective:    Patient ID: [name], female    DOB: 09-14-1945, 66 y.o.   MRN: [id number]  HPI This is a 66 year old with a past medical history significant for a mitochondrial myopathy who is easily fatigable who presents with low back pain after a fall yesterday. The patient fell backward onto her lower back on soft ground after yanking at a weed in her yard.  She has been icing her back since the incident and taking occasional Advil and reports improvement in the pain, however, it still remains debilitating.  The pain is worse with sitting-up and better when standing or lying flat.   Review of Systems    Objective:   Physical Exam Gen: moderate uncomfortable, laying flat on exam table with pillow between legs; she needed significant support to help move her on the exam table MSK:    Back: no spinal tenderness; no bruising or other visual abnormality   Hip: TTP along her left posterior illiac crest more to lateral aspect  Ultrasound of back Revealed partial tear between left gluteus medius and maximus with some fluid collection Intact musculature and no fluid collection in right side    Assessment & Plan:

## 2011-06-03 NOTE — Assessment & Plan Note (Signed)
With L>R following falling and landing on her back yesterday. Ultrasound today revealed a left gluteal medius/maximus partial tear and swelling.  Back brace given today to help stabilize.  Vicodin 5/500 q6-8 hours prn pain.  Ice for the next few days, then ice/heat prn.  Follow-up in 10 days. Will consider stretching exercises at that time if pain is improved.

## 2011-06-15 ENCOUNTER — Ambulatory Visit (INDEPENDENT_AMBULATORY_CARE_PROVIDER_SITE_OTHER): Payer: Medicare Other | Admitting: Sports Medicine

## 2011-06-15 VITALS — BP 100/60

## 2011-06-15 DIAGNOSIS — M549 Dorsalgia, unspecified: Secondary | ICD-10-CM

## 2011-06-15 NOTE — Patient Instructions (Signed)
Try heating pad in back brace or Therawrap over back.   Take ibuprofen as needed for pain.  Recommend gentle walking if you are able.   In about 3 weeks, try to do strengthening exercises if the pain is tolerable.  If you are still in a lot of pain in 3 weeks, we may need to get an x-ray of your back at that time.

## 2011-06-15 NOTE — Assessment & Plan Note (Signed)
Suspect tear of dorsal fascia as well as partial left gluteal medius/maximus tear seen on previous ultrasound.  Patient reports improvement in pain, however, she still is having significant pain with position changes.  Continue ibuprofen prn pain.  Recommend stretching/strengthening exercises in about 3 weeks or sooner.  If patient has significant pain at this time, may need to get x-ray of lumbar spine to rule-out compression fracture.

## 2011-06-15 NOTE — Progress Notes (Signed)
  Subjective:    Patient ID: [name], female    DOB: Oct 28, 1945, 66 y.o.   MRN: [id number]  HPI Patient is a 66 year old female with a history of unspecified mitochondrial muscle disorder presenting for follow-up of back pain after falling onto her back 1-2 weeks ago.  She was diagnosed with a partial gluteus medius/maximus tear on her left at that time.  Patient reports significant improvement in the pain, which does not bother her significantly except when she is going from a sitting to laying position.  She is taking ibuprofen as needed. She did not like taking the Vicodin due to constipation and making her feel "loopy".   Her husband is, however, more concerned about her pain since she does have difficulty doing any sort of bending activity (loading dishwasher, going to the bathroom) and has not read any books due to the heaviness of books causing her back pain. Patient is a prolific reader, reading 3-4 books a day normally.   Review of Systems    Objective:   Physical Exam GEN: well-developed, well-nourished; NAD unless shifting positions, seems to be very uncomfortable going from sitting to laying position and vice versa but she is able to move herself independently into these different positions PULM: Breathing comfortably in no respiratory distress EXT: No clubbing, cyanosis, or edema PSYCH: Normally interactive. Cooperative during the interview. Pleasant. Friendly and conversant. Not anxious or depressed appearing. Normal, full affect.  BACK: Inspection: no bruising, redness, mass, or other abnormality Palpation:   No mid-line tenderness   Tender bilateral lumbar paraspinous area, over SIJ  Gait: slow but able to ambulate without problem; steady    Assessment & Plan:

## 2011-07-23 ENCOUNTER — Encounter: Payer: Self-pay | Admitting: *Deleted

## 2011-08-07 ENCOUNTER — Ambulatory Visit (INDEPENDENT_AMBULATORY_CARE_PROVIDER_SITE_OTHER): Payer: Medicare Other | Admitting: Internal Medicine

## 2011-08-07 ENCOUNTER — Encounter: Payer: Self-pay | Admitting: Internal Medicine

## 2011-08-07 VITALS — BP 120/62 | HR 88 | Ht 63.0 in | Wt 135.0 lb

## 2011-08-07 DIAGNOSIS — K8020 Calculus of gallbladder without cholecystitis without obstruction: Secondary | ICD-10-CM

## 2011-08-07 DIAGNOSIS — R197 Diarrhea, unspecified: Secondary | ICD-10-CM

## 2011-08-07 DIAGNOSIS — R109 Unspecified abdominal pain: Secondary | ICD-10-CM

## 2011-08-07 MED ORDER — MOVIPREP 100 G PO SOLR: ORAL | Status: DC

## 2011-08-07 NOTE — Patient Instructions (Addendum)
You have been scheduled for a colonoscopy with propofol. Please follow written instructions given to you at your visit today.  Please pick up your prep kit at the pharmacy within the next 1-3 days. If you use inhalers (even only as needed), please bring them with you on the day of your procedure. CC: Dr Paula Compton

## 2011-08-07 NOTE — Progress Notes (Signed)
[name] 11-18-1945 MRN [id number]  History of Present Illness:  This is a 66 year old white female who is here to discuss colonoscopy. 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 She also has a history of polycythemia with a Hgb of 15.7 and increased MCV of 103. She denies excessive alcohol use. She has abnormal liver function tests and low B12 levels of 289. She has been told by her rheumatologist at [facility] that she may be higher risk for certain types of anesthesia. Her bowel habits are regular. She has occasional diarrhea. She also claims to have hypoglycemia. She had a screening colonoscopy in September 2003 and this was a normal exam. She had a flexible sigmoidoscopy in 1992 which was normal. Her sprue profile has been negative. She was found to have multiple gallstones on an upper abdominal ultrasound in 2010 but otherwise, a normal-appearing gallbladder and common bile duct.   Past Medical History  Diagnosis Date  . Pericarditis     age 40  . Pneumothorax   . Anxiety   . Iliotibial band syndrome     left knee  . Vitamin d deficiency   . Migraine   . Asthma   . Anemia   . IBS (irritable bowel syndrome)   . Hemorrhoids   . Cholelithiasis   . Mitochondrial myopathy   . Heart attack   . Polio     as an infant   Past Surgical History  Procedure Date  . Appendectomy   . Cesarean section     x 4  . Nasal sinus surgery   . Vein surgery     reports that she has never smoked. She has never used smokeless tobacco. She reports that she drinks about .5 ounces of alcohol per week. She reports that she does not use illicit drugs. family history includes Alcohol abuse in her father; Arthritis in her mother; Asthma in her mother; Cancer in her father; Depression in her mother; Emphysema in her father; Heart attack in her father; and Stroke in her father.  There is no history of Colon cancer. No Known  Allergies      Review of Systems: Denies heartburn dysphagia odynophagia  The remainder of the 10 point ROS is negative except as outlined in H&P   Physical Exam: General appearance  Well developed, in no distress. Eyes- non icteric. HEENT nontraumatic, normocephalic. Mouth no lesions, tongue papillated, no cheilosis. Neck supple without adenopathy, thyroid not enlarged, no carotid bruits, no JVD. Lungs Clear to auscultation bilaterally. Cor normal S1, normal S2, regular rhythm, no murmur,  quiet precordium. Abdomen: Soft nontender with enlarged left lobe of the liver. No ascites.  Rectal: Soft Hemoccult negative stool. Extremities no pedal edema. Skin no lesions. Mild palmar erythema left hand. No Dupuytren's contractures Neurological alert and oriented x 3. No asterixis Psychological normal mood and affect.  Assessment and Plan:  Problem #1 Patient with myositis followed at [facility]. Apparently no specific treatment has been available. Her main symptom is fatigue and decreased level of energy.  Problem #2  Colorectal screening. She is due for a recall colonoscopy. Her last exam was in September 2003. We will schedule a colonoscopy with moviprep. I have discussed propofol anesthesia.  Problem #3 Cholelithiasis. She is interested in a cholecystectomy. I would obtain a hepatobiliary scan first and also follow her liver function test. She has mild enlargement of the left lobe of the liver but her upper  abdominal ultrasound did not confirm that. We will discuss a possible cholecystectomy after we see how she gets through with a colonoscopy.   08/07/2011 [name]

## 2011-08-10 ENCOUNTER — Encounter: Payer: Self-pay | Admitting: Internal Medicine

## 2011-09-21 ENCOUNTER — Encounter: Payer: Self-pay | Admitting: Home Health Services

## 2011-09-30 ENCOUNTER — Telehealth: Payer: Self-pay | Admitting: Internal Medicine

## 2011-09-30 NOTE — Telephone Encounter (Signed)
Left message with female for patient to call back. 

## 2011-10-01 NOTE — Telephone Encounter (Signed)
Patient questions answered.

## 2011-10-02 ENCOUNTER — Encounter: Payer: Self-pay | Admitting: Internal Medicine

## 2011-10-02 ENCOUNTER — Ambulatory Visit (AMBULATORY_SURGERY_CENTER): Payer: Medicare Other | Admitting: Internal Medicine

## 2011-10-02 VITALS — BP 130/75 | HR 85 | Temp 98.6°F | Resp 19 | Ht 63.0 in | Wt 135.0 lb

## 2011-10-02 DIAGNOSIS — D126 Benign neoplasm of colon, unspecified: Secondary | ICD-10-CM

## 2011-10-02 DIAGNOSIS — R109 Unspecified abdominal pain: Secondary | ICD-10-CM

## 2011-10-02 MED ORDER — SODIUM CHLORIDE 0.9 % IV SOLN: 500.0000 mL | INTRAVENOUS | Status: DC

## 2011-10-02 NOTE — Progress Notes (Signed)
Patient did not experience any of the following events: a burn prior to discharge; a fall within the facility; wrong site/side/patient/procedure/implant event; or a hospital transfer or hospital admission upon discharge from the facility. (G8907) Patient did not have preoperative order for IV antibiotic SSI prophylaxis. (G8918)  

## 2011-10-02 NOTE — Patient Instructions (Addendum)

## 2011-10-02 NOTE — Op Note (Signed)
[facility] [address] [place], [address]   COLONOSCOPY PROCEDURE REPORT  PATIENT: [name], [name]  MR#: [id number] BIRTHDATE: 1945-08-30 , 66  yrs. old GENDER: Female ENDOSCOPIST: Hart Carwin, MD REFERRED BY:  Paula Compton, M.D. PROCEDURE DATE:  10/02/2011 PROCEDURE:   Colonoscopy with snare polypectomy ASA CLASS:   Class II INDICATIONS:last colon 2003, now abd.  pain, cholelithiasis. MEDICATIONS: MAC sedation, administered by CRNA and Propofol (Diprivan) 200 mg IV  DESCRIPTION OF PROCEDURE:   After the risks and benefits and of the procedure were explained, informed consent was obtained.  A digital rectal exam revealed no abnormalities of the rectum.    The LB [id number]-[id number]  endoscope was introduced through the anus and advanced to the cecum, which was identified by both the appendix and ileocecal valve .  The quality of the prep was good, using MoviPrep .  The instrument was then slowly withdrawn as the colon was fully examined.     COLON FINDINGS: A sessile polyp ranging between 3-48mm in size with a friable surface was found in the rectum.  A polypectomy was performed with a cold snare.  The resection was complete and the polyp tissue was completely retrieved.     Retroflexed views revealed no abnormalities.     The scope was then withdrawn from the patient and the procedure completed.  COMPLICATIONS: There were no complications. ENDOSCOPIC IMPRESSION: Sessile polyp 7mm in size was found in the rectum at 5 cm, polypectomy was performed with a cold snare with small amount of postpolypectomy bleeding which stopped spontaneously, nothing to account for the abdominal pain  RECOMMENDATIONS: 1.  await pathology results 2.  High fiber diet 3.   no ASA x 2 weeks   REPEAT EXAM: for Colonoscopy. in 5 years  cc:  _______________________________ eSigned:  Hart Carwin, MD 10/02/2011 8:41 AM     PATIENT NAME:  [name], [name] MR#: [id number]

## 2011-10-05 ENCOUNTER — Telehealth: Payer: Self-pay | Admitting: *Deleted

## 2011-10-05 NOTE — Telephone Encounter (Signed)
  Follow up Call-  Call back number 10/02/2011  Post procedure Call Back phone  # [phone]  Permission to leave phone message Yes     Patient questions:  Do you have a fever, pain , or abdominal swelling? no Pain Score  0 *  Have you tolerated food without any problems? yes  Have you been able to return to your normal activities? yes  Do you have any questions about your discharge instructions: Diet   no Medications  no Follow up visit  no  Do you have questions or concerns about your Care? no  Actions: * If pain score is 4 or above: No action needed, pain <4.

## 2011-10-06 ENCOUNTER — Encounter: Payer: Self-pay | Admitting: Internal Medicine

## 2011-12-02 ENCOUNTER — Telehealth: Payer: Self-pay | Admitting: Family Medicine

## 2011-12-02 MED ORDER — ALPRAZOLAM 0.25 MG PO TABS: 0.2500 mg | ORAL_TABLET | Freq: Every evening | ORAL | Status: DC | PRN

## 2011-12-02 NOTE — Telephone Encounter (Signed)
Left message for patient to return call, please see message from Dr Mauricio Po.Busick, Rodena Medin

## 2011-12-02 NOTE — Telephone Encounter (Signed)
Patient is calling for a refill on her Xanax.  Her pharmacy has sent in requests for 2 days, but I don't see them in the MD box, or electronically.  She needs it sent to [facility] on [address].  Her pharmacy told her that it can be sent in electronically.

## 2011-12-02 NOTE — Telephone Encounter (Signed)
Received paper refill request from [facility], requesting refill.  By my notes, she has used 1 tab alprazolam at bedtime on occasion, up to 3 times a month.  If she has gone through a yearlong prescription since March, then she is using more than she had been.  I will refill 30 tabs and request follow up appt to discuss. New Rx to be faxed to [facility]. Paula Compton, MD

## 2011-12-03 NOTE — Telephone Encounter (Signed)
Patient has appt with PCP on 12/08/11.[name], Rodena Medin

## 2011-12-08 ENCOUNTER — Ambulatory Visit (INDEPENDENT_AMBULATORY_CARE_PROVIDER_SITE_OTHER): Payer: Medicare Other | Admitting: Family Medicine

## 2011-12-08 ENCOUNTER — Encounter: Payer: Self-pay | Admitting: Family Medicine

## 2011-12-08 VITALS — BP 130/82 | HR 90 | Ht 63.0 in | Wt 136.6 lb

## 2011-12-08 DIAGNOSIS — F411 Generalized anxiety disorder: Secondary | ICD-10-CM

## 2011-12-08 MED ORDER — LORAZEPAM 0.5 MG PO TABS: 0.5000 mg | ORAL_TABLET | Freq: Two times a day (BID) | ORAL | Status: DC | PRN

## 2011-12-08 NOTE — Patient Instructions (Addendum)
It was a pleasure to see you today; I am changing the Xanax (Alprazolam) to Lorazepam 0.5mg , to be taken twice daily.  It is in the same family as alprazolam.  If it is not helping with the anxiety or with sleep, you may take 2 tablets (1mg ) to see if it works better.  If not working, please call me.

## 2011-12-09 NOTE — Assessment & Plan Note (Signed)
Discussed use of BNZ for anxiety; she has been using Xanax for over 10 years primarily for sleep.  She states that she needs something to help with anxiety during the day, not just at bedtime.  Plan for trial of Ativan twice daily, and suspend use of Xanax for now.  May consider return to Xanax if not improving or if not tolerating Ativan.  She has not been tried on clonazepam or lorazepam in the past. Same precautions (sedation, not to be used with alcohol or other sedatives) as with Xanax. To reassess at next visit, or by phone if does not seem to be helping her or any questions or concerns.

## 2011-12-09 NOTE — Progress Notes (Signed)
  Subjective:    Patient ID: [name], female    DOB: 1945-03-28, 66 y.o.   MRN: [id number]  HPI Here today to discuss use of alprazolam.  She reports that there are stressful events in her life that have prompted her to use alprazolam more often than she previously had.  Used to use Alprazolam 0.25mg  one tablet at bedtime for sleep; occasionally used an additional 1/2 tablet at bedtime.  Now has taken to adding the additional half tablet every night.  Thinks about using during the day for anxiety symptoms, but resists ('my parents used too much medication, and I don't want to be like that".)  Denies SI or HI, states that she believes the stressful situation (which she prefers not to discuss in detail) will pass.  Believes she can weather the storm much as she has come through other bad events.  States her married life now is well.   Review of Systems No alcohol or other substance use. Feels safe.     Objective:   Physical Exam Generally well appearing, no apparent distress Emotionally labile when discussing medication use.         Assessment & Plan:

## 2012-01-06 ENCOUNTER — Other Ambulatory Visit: Payer: Self-pay | Admitting: *Deleted

## 2012-01-06 ENCOUNTER — Encounter: Payer: Self-pay | Admitting: Internal Medicine

## 2012-01-06 ENCOUNTER — Ambulatory Visit (INDEPENDENT_AMBULATORY_CARE_PROVIDER_SITE_OTHER): Payer: Medicare Other | Admitting: Internal Medicine

## 2012-01-06 VITALS — BP 110/60 | HR 96 | Temp 97.9°F | Resp 16 | Ht 61.75 in | Wt 135.6 lb

## 2012-01-06 DIAGNOSIS — G252 Other specified forms of tremor: Secondary | ICD-10-CM

## 2012-01-06 DIAGNOSIS — R079 Chest pain, unspecified: Secondary | ICD-10-CM

## 2012-01-06 DIAGNOSIS — K589 Irritable bowel syndrome without diarrhea: Secondary | ICD-10-CM

## 2012-01-06 DIAGNOSIS — G729 Myopathy, unspecified: Secondary | ICD-10-CM

## 2012-01-06 DIAGNOSIS — G250 Essential tremor: Secondary | ICD-10-CM

## 2012-01-06 DIAGNOSIS — G43909 Migraine, unspecified, not intractable, without status migrainosus: Secondary | ICD-10-CM

## 2012-01-06 DIAGNOSIS — F411 Generalized anxiety disorder: Secondary | ICD-10-CM

## 2012-01-06 MED ORDER — B-12 2000 MCG PO TABS: 2000.0000 ug | ORAL_TABLET | Freq: Every day | ORAL | Status: DC

## 2012-01-06 NOTE — Progress Notes (Signed)
Subjective:    Patient ID: [name], female    DOB: 06/19/1945, 66 y.o.   MRN: [id number]  HPI Patient Active Problem List  Diagnosis  . PERNICIOUS ANEMIA--she believes that she does not necessarily have pernicious anemia but has been treated with B12 injections 3 times a week for her myopathy with some success in the past /this was done by a few doctors in [place], [place] was specifically started by a neurologist at [facility]   . ANXIETY-note history and chart from Dr. Mauricio Po   . TREMOR, ESSENTIAL/FAMILIAL  . MIGRAINE HEADACHE-once a year   . UNSPECIFIED MYOPATHY-diagnosis of mitochondrial myopathy in 2004 bybiopsy at [facility]--was thought for many years, since childhood to have a form of polio because of the various effects of this disorder on her body . She has been treated at both [facility] and [facility] without any success to date.   . 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 IRRITABLE BOWEL SYNDROME? Due to anxiety versus due to metabolic abnormality from myopathy /recent colonoscopy and evaluation by Dr. Dickie La was basically reassuring   . MENOPAUSAL SYNDROME  . ILIOTIBIAL BAND SYNDROME, LEFT KNEE  . INSOMNIA UNSPECIFIED  . METATARSALGIA  . IDIOPATHIC OSTEOPOROSIS  . Personal history of endocrine/metabolic/immunity disorder  . Encounter for long-term (current) use of medications  . Chest pain-she has a distinctly abnormal EKG which has been evaluated including catheterization by Dr. Gevena Cotton is thought to be healthy from a cardiac standpoint without evidence of a cardiomyopathy   . Low back pain  . Back pain  -   Last review was from family practice: Marigene Ehlers, MPH 09/24/2010 9:25 AM Signed  Patient here for annual wellness visit, patient reports:  Risk Factors/Conditions needing evaluation or treatment:  Pt does not have any risk factors that need evaluation. Pt does reports feeling tired from Mitochondrial Myopathy condition.  Home  Safety:  Pt lives with husband in 2 story home. Pt reports having smoke detectors and adaptive equipment in bathroom.  Other Information:  Corrective lens: Pt wears daily corrective lens and visits eye doctor 1x year.  Dentures: Pt does not have dentures.  Memory: Pt denied memory problems.  Patient's Mini Mental Score (recorded in doc. flowsheet): 30  Balance/Gait: Pt does not have any noticeable impairments; however pt reports having to be very intentional when walking to avoid falling.      Review of Systems No fever chills or night sweats No vision changes/no history of ophthalmoplegia secondary to myopathy No chest pain Dyspnea on exertion is thought related to her myopathy and generalized deconditioning She has frequent diarrhea-see above No genitourinary difficulties     Objective:   Physical Exam Filed Vitals:   01/06/12 1329  BP: 110/60  Pulse: 96  Temp: 97.9 F (36.6 C)  Resp: 16   Pupils equal round reactive to light and accommodation/EOMs conjugate Heart regular Extremities without edema Cranial nerves II through XII intact Gait stable Mood good/affect appropriate/judgment Sound       Assessment & Plan:  Problem #1 mitochondrial myopathy  She is dissatisfied with current treatment and is interested in exploring other options  I gave her the contact numbers for [facility] to consider  We should review her past evaluation with the following ideas in mind:  The examination of a patient with suspected mitochondrial disease should look for evidence of the following [71]: Optic atrophy and pigmentary retinopathy  Cardiac enlargement  Central nervous system findings, particularly cerebellar (eg, ataxia), brainstem (  eg, ophthalmoparesis, sensorineural hearing loss) and basal ganglia signs (eg, movement disorders)  Peripheral nervous system findings, particularly hypotonia (in infants), muscle weakness and peripheral neuropathy Laboratory studies - Although  there is no consensus, the evaluation of a suspected primary mitochondrial disorder in most cases includes the following tests [68,71,72]: Complete blood count  Electrolytes  Fasting blood glucose and glycosylated hemoglobin  Renal function tests  Liver function tests  Creatine kinase  Plasma lactate, pyruvate, and lactate/pyruvate ratio (see 'Lactate' below)  Quantitative plasma amino acids (for elevated alanine)  Quantitative urine organic acids (for elevations in Krebs cycle intermediates, methylmalonate, 3-methyl glutaconate, and dicarboxylic acid)  Plasma acylcarnitine analysis (for low free carnitine, elevated acyl/free carnitine ratio, elevations suggestive of disrupted fatty acid oxidation)  Electrocardiography  Plasma ammonia  Brain MRI to evaluate for nonspecific delayed myelination pattern in early disease, symmetric lesions of the deep gray matter specific for Leigh disease, stroke-like lesions suggestive of MELAS, and cerebral and/or cerebellar atrophy with bilateral deep gray lesions seen in mitochondrial DNA deletion disorders [72]  Electromyography and nerve conduction studies even in patients without muscle weakness, sensory symptoms, or areflexia  Muscle biopsy (see 'Muscle biopsy' below)  She will return in 3 months for an examination with general lab work and will cover the above as well She would like to continue vitamin B12 and prefer to do this the oral method Meds ordered this encounter  Medications  .  Cyanocobalamin (B-12) 2000 MCG TABS    Sig: Take 2,000 mcg by mouth daily.    Dispense:  30 tablet    Refill:  11     See the following espec drugs to avoid  Official reprint from UpToDate  [email or web address] 2013 UpToDate  Print  Back  Mitochondrial myopathies: Treatment  Authors Anastasia Fiedler, MD Rafael Bihari, MD Section Editor Tami Ribas, MD, PhD Deputy Editor Lynford Citizen, MD, PhD Disclosures  All topics are updated as new evidence becomes  available and our peer review process is complete.  Literature review current through: Nov 2013.  This topic last updated: Mar 31, 2011.  INTRODUCTION - Mitochondrial diseases are caused by pathologic dysfunction of the mitochondrial respiratory chain. They present with a wide range of clinical expression. The organ systems most reliant on aerobic metabolism are preferentially affected. Myopathy may be the sole or main sign, or merely an incidental finding associated with a multisystemic illness. Involvement of the nervous system in general (referred to as mitochondrial encephalomyopathy) is common. When skeletal muscle is affected, either alone or with central nervous system disease, the term mitochondrial myopathy is used. The following groups illustrate the different ways mitochondrial myopathies can present clinically: As chronic progressive external ophthalmoplegia (with or without mild proximal muscle weakness) or Kearns-Sayre syndrome  As an isolated myopathy with or without exercise intolerance and/or myalgia  As a severe myopathy or encephalomyopathy of infancy and childhood  As a predominantly multisystem disease (eg, MELAS) A certain degree of overlap exists between all these entities. (See "Mitochondrial myopathies: Clinical features and diagnosis", section on 'Clinical features'.) Despite considerable advances in the genetics and molecular biology of mitochondrial disorders, the mainstay of current treatment remains supportive. Many pharmacologic agents have been tried in case reports or small series, and a few have been tested in randomized controlled trials. While some of these agents appear to be beneficial for various surrogate measures, none has demonstrated a long-term benefit for clinically important outcomes. This topic will first discuss symptomatic therapies and review the evidence for  the pharmacologic agents. We will divide the pharmacologic agents by presumed mechanism of action,  concentrating on those with more robust or more promising evidence. Finally, future targets for gene therapy will be introduced. Other aspects of mitochondrial myopathies are discussed separately. (See "Mitochondrial structure, function, and genetics" and "Mitochondrial myopathies: Clinical features and diagnosis".) SYMPTOMATIC THERAPY - The mainstay of current treatment for patients with mitochondrial myopathies is supportive. Depending on the degree of impairment and the extent of neurologic involvement, the following evaluations and interventions may be required [1-3]: Respiratory care - This is often a life-saving or life-sustaining measure in patients with chronic respiratory failure. A discussion of respiratory or sleep-related symptoms should occur regularly with the patient and family. Pulmonary function tests should be obtained at baseline and again as clinically indicated when symptoms appear. Interventions include noninvasive measures such as continuous positive air pressure (CPAP) and bilevel positive air pressure (BiPAP). Some patients may eventually require tracheostomy [4].  Control of seizures - Standard anticonvulsant therapy can be used to control seizures. An exception is that valproic acid and its derivatives should be avoided if possible because they inhibit the biosynthesis of carnitine, potentially leading to impaired mitochondrial beta-oxidation and fatty acid metabolism.  Cardiologic assessment - for cardiomyopathy and conduction defects.  Ophthalmologic evaluation - Ptosis can be treated with frontalis muscle-eyelid suspension, levator palpebrae resection, eyelid crutches, or blepharoplasty. Cataracts are treated with intraocular lens replacement. Ophthalmoplegia usually does not require any specific intervention, but can be addressed by glasses with corrective prisms when accompanied by significant diplopia. Strabismus surgery is also an option if the degree of ophthalmoplegia is  stable for at least six months on serial measurements [5].  Audiologic evaluation - Sensorineural hearing loss can be addressed with cochlear implants. Aminoglycosides should be avoided [6]. (See 'Drugs to avoid' below.)  Screening for diabetes mellitus - Diabetes can be managed with dietary modification, oral agents, or insulin as appropriate. However, metformin should be avoided because it has been associated with lactic acidosis. (See "Metformin poisoning", section on 'Lactic acidosis'.)  Multidisciplinary speech, physical, and occupational therapy - This can be helpful for patients with central nervous system deficits such as dysarthria, dysphagia, weakness, spasticity, and/or ataxia [7].  Cognitive evaluation and appropriate intervention. EXERCISE - Exercise appears to be beneficial in mitochondrial disorders. Aerobic exercise has been associated with increased peak work, oxidative capacity, and mitochondrial volume [8-10]. In addition, aerobic exercise can prevent muscle deconditioning and decrease exercise intolerance [11]. There is also some evidence that the response to resistance training exercise can alter the proportion of mutant and wild-type mitochondrial DNA (ie, gene shifting) in regenerated muscle fibers by activating wild-type satellite cells [10,12,13]. However, it is not clear whether this is a viable strategy to reduce the burden of mutant mitochondrial DNA in muscle and thereby improve muscle function. Given these data, we suggest routine moderate level aerobic exercise (eg, walking, running, cycling, or swimming) for patients with mitochondrial disorders who are able to participate in physical activity combined with resistance strength training. PHARMACOLOGIC THERAPY - There is no proven effective therapy for the primary mitochondrial disorders. Pharmacologic strategies that have been tested for these conditions include the use of respiratory chain cofactors, treatment with  antioxidants, trials of agents that correct secondary biochemical deficits, and therapy with drugs that reduce lactic acid accumulation. Much of the evidence comes from single case reports and small open-label studies. A systematic review published in 2012 identified 12 randomized controlled trials that evaluated pharmacologic agents for mitochondrial diseases, but  no clear evidence was found to support the use of any intervention [14]. Although not established as effective for mitochondrial disorders, our typical regimen for adults and adolescents is coenzyme Q10 (400 mg daily), creatine 10 g daily, and L-carnitine (990 mg daily in three divided doses). Respiratory chain cofactors - Succinate, riboflavin, thiamine, and coenzyme Q10 participate as cofactors in the electron transport chain enzymes (figure 1). Supplementation is thought to enhance the activity of these enzymes when they are deficient [1]. Although evidence of possible modest benefit with coenzyme Q10 is sparse, we suggest it for patients with primary mitochondrial disorders, as discussed in the next section. However, there is no evidence that supplementation with succinate, riboflavin, or thiamine is useful for improving function or outcome in patients with primary mitochondrial disease. Coenzyme Q10 - Coenzyme Q10 has been evaluated in small randomized controlled trials, with equivocal results. The largest trial was a placebo-controlled blinded crossover design that evaluated 30 patients with various mitochondrial encephalomyopathies, including 15 with mitochondrial encephalopathy with lactic acidosis and stroke-like episodes (MELAS) and 11 with chronic progressive external ophthalmoplegia [15]. Treatment with coenzyme Q10 (1200 mg daily) for 60 days did not lead to improvement in clinical measures such as grip strength, activities of daily living, or quality of life but did increase oxygen uptake (VO2) after 15 minutes of exercise and  attenuated the rise in lactate.  Another double-blind crossover trial evaluated eight patients with various mitochondrial encephalomyopathies, including three with MELAS, who took coenzyme Q10 (160 mg/day) for three months and placebo for one month, followed by a one month wash out period [16]. Coenzyme Q10 treatment led to a statistically significant increase in a global scale of muscle strength and an increase in serum levels of coenzyme Q10.  The earliest trial evaluated 17 patients with chronic progressive external ophthalmoplegia and was negative [17]. An ongoing placebo-controlled trial is evaluating coenzyme Q10 for children with a mutation in mtDNA or nuclear DNA associated with dysfunction of the respiratory chain or the electron transport chain [18]. Coenzyme Q10 is both an antioxidant (as discussed in the next section) and an integral part of the mitochondrial respiratory chain, where it acts as an electron acceptor. It is uncertain which (if either) of these roles is beneficial when coenzyme Q10 supplementation is used for patients with mitochondrial disorders. However, for patients with coenzyme Q10 deficiency, benefit probably derives from its function as an electron carrier [1]. (See 'Coenzyme Q10 deficiency' below.) Given the evidence of modest benefit in one trial, and the lack of serious side effects, we suggest treatment with coenzyme Q10 for patients with mitochondrial disorders. The suggested dose is 5 mg/kg per day for infants and children and 400 mg daily for adolescents and adults. Higher doses are suggested for patients who have mitochondrial disease with coenzyme Q10 deficiency. (See 'Coenzyme Q10 deficiency' below.) Antioxidants - Mitochondrial diseases in general result in an increase in oxidative stress and higher levels of reactive oxygen species [19]. This may cause damage to the cell membrane through lipid peroxidation. A number of antioxidants have been used to treat patients  with mitochondrial myopathies, with the rationale that scavenging reactive oxygen species would lead to clinical improvement. The list includes coenzyme Q10, idebenone (a coenzyme Q10 analog), vitamin E, and dihydrolipoate. While some observational reports suggested partial improvement in clinical function and/or biochemical parameters in some patients [20-22], there is no convincing evidence that use of these agents leads to a clinically meaningful benefit. The equivocal evidence regarding coenzyme Q10 is discussed above. (  See 'Coenzyme Q10' above.) Correcting secondary biochemical deficits - Levels of carnitine, creatine, and folate are decreased in patients with mitochondrial disorders, although the exact mechanisms are unclear [23,24]. Given the relative harmless effect of their supplementation, they are often given as part of a "mitochondrial cocktail" to patients. However, there is no compelling evidence that supplementation of these agents produces a clinically important benefit. Nevertheless, we suggest treatment with creatine and L-carnitine for patients with mitochondrial disorders, as discussed in the next sections. Creatine - Two small trials tested creatine for mitochondrial disorders, with conflicting results. In a placebo-controlled crossover trial involving six patients (five with MELAS and one with a mitochondrial myopathy), treatment with creatine (10 g/day for two weeks followed by 4 g/day for one week) resulted in improvement in some variables, including a statistically significant 20 percent increase in hand grip strength [25]. However, there was no improvement in activities of daily living.  A second placebo-controlled crossover trial involving 16 patients with a mitochondrial disorder (including 13 with chronic progressive external ophthalmoplegia) found no significant benefit after four weeks with high dose creatine (20 g daily) [26]. In another placebo-controlled crossover trial that  analyzed results from 16 patients with various mitochondrial disorders, treatment with a combination of creatine, coenzyme Q10, and lipoic acid resulted in a reduction of serum lactate and a reduction in the decline of peak ankle dorsiflexion strength [27]. However, there was no effect on pulmonary function, or on peak handgrip or knee extension strength. Given the evidence from at least one controlled trial of possible modest benefit, and its relative lack of side effects, we suggest creatine to most of our patients with mitochondrial encephalomyopathies. The optimal creatine dose is uncertain. For adolescents and adults, one trial used 10 g daily for the first two weeks, followed by 4 g daily for maintenance [25]. However, cramps can be a problem at such high doses. For that reason, we usually start creatine at 4.5 g daily given in three divided doses, and titrate it up as tolerated, with a maximum total daily dose of 10 g. For infants and young children, creatine doses from 0.08 to 0.35 g/kg daily have been used [28]. Carnitine - L-carnitine (levocarnitine) has not been studied in controlled trials in the context of primary mitochondrial respiratory chain disorders. However, carnitine supplementation can be almost curative in primary systemic carnitine deficiency, which typically presents in childhood with dilated cardiomyopathy. (See "Causes of metabolic myopathies", section on 'Systemic carnitine deficiency with cardiomyopathy'.) We suggest treatment with L-carnitine because it is generally safe and may help to correct an underlying deficiency of free carnitine [1]. The suggested dose for adolescents and adults is L-carnitine 1000 mg daily in the morning. The suggested dose for children is 100 to 200 mg/kg daily in four divided doses [29]. Reducing lactic acid accumulation - A severe deficiency in enzymes of the respiratory chain can lead to accumulation of upstream substrates such as lactate, which is  considered a neurotoxic agent [30]. However, attempts at reducing serum lactate with bicarbonate showed only transient buffering effects with worsening of cerebral function [31]. In addition, controlled trials have not shown benefit for agents that reduce the accumulation of lactic acid. Dichloroacetate increases the activity of pyruvate dehydrogenase, which allows pyruvate to be oxidized rather than being converted to lactate. However, results from three randomized placebo-controlled trials of dichloroacetate were disappointing [32-34]. The first trial involved 11 patients with mitochondrial diseases and failed to show any clinical improvement in muscle strength or disability [32]. The second  trial was stopped early because nearly all patients treated with dichloroacetate developed a polyneuropathy in the absence of any clear benefit [33]. The third trial found no improvement in neurologic or other measures of clinical outcome [34].  Dimethylglycine, another agent known to decrease serum lactate, did not have any effect on oxygen consumption or several biochemical parameters of disease in a placebo-controlled crossover trial involving five children with the [place] type of Leigh syndrome [35]. Select mitochondrial disorders - Coenzyme Q10 deficiency is responsive to high-dose coenzyme Q10 therapy. Preliminary studies suggest that MELAS is responsive to l-arginine, but efficacy has not been established. The evidence is presented in the sections that follow. Coenzyme Q10 deficiency - Coenzyme Q10 deficiency is a rare, clinically and genetically heterogeneous condition that can manifest in several ways. Reported presentations include an isolated myopathy with exercise intolerance, a myopathy with associated central neurologic manifestations (eg, ataxia and seizures), a severe encephalomyopathy of infancy (ie, Leigh syndrome), or an isolated progressive ataxia syndrome [36,37]. Distinction should be made  between primary deficiency caused by mutations in nuclear genes controlling coenzyme Q10 synthesis and secondary deficiency caused by other disorders of the respiratory chain. In one series of 30 patients with heterogeneous mitochondrial phenotypes, muscle coenzyme Q10 content was below normal for 28 patients, and nine of those patients were categorized as having secondary coenzyme Q10 deficiency because they had mutations in genes not involved in coenzyme Q10 biosynthetic pathways [38]. While treatment seems clearly indicated in primary deficiency, the majority of cases involve secondary deficiency, which may also respond to supplementation regardless of the underlying mutation. In all reported forms, there is observational evidence that high-dose oral coenzyme Q10 treatment is associated with clinically meaningful improvement in muscle function in some patients [37-41]. Furthermore, coenzyme Q10 treatment can be life-saving in infants with encephalomyopathy [1,42,43]. However, central nervous system manifestations may be only partially reversible or may continue to progress despite treatment [37,44,45]. Anecdotal evidence suggests that treatment prior to the onset of overt neurologic symptoms (age 17 months in the reported case) can prevent neurologic involvement [45]. We recommend high-dose oral coenzyme Q10 treatment for patients with coenzyme Q10 deficiency. Suggested coenzyme Q10 dosing is 5 to 30 mg/kg daily (in three divided doses) for infants and children, 300 to 1500 mg daily for adolescents, and up to 2400 mg daily for adults [37,42,43]. Myopathies or encephalomyopathies related to coenzyme Q10 deficiency tend to respond well at lower doses of coenzyme Q10. However, patients with ataxic presentation usually require a higher dose of coenzyme Q10 (up to 3000 mg daily) and do not respond as well, possibly due to cerebellar neuronal death [31]. MELAS - MELAS is one of the most common mitochondrial  disorders, but no specific disease-modifying therapy is available. In two small trials discussed above that included some patients with MELAS, there was marginal improvement on some outcome measures with coenzyme Q10 treatment [16] and with creatine treatment [25]. The mechanism of stroke-like episodes in MELAS is uncertain but may be related to regional metabolic energy failure. Another hypothesis is that an impairment of cerebral artery vasodilation may cause or exacerbate these episodes [1,46]. If so, arginine could have a therapeutic effect because it is a precursor of nitric oxide, an important mediator of cerebral vasodilation [46]. Furthermore, a small case-control study found that the mean serum level of arginine was significantly lower in patients with MELAS than in healthy controls [47]. In an open-label pilot study, l-arginine infusion (0.5 g/kg) or placebo was given in an approximately 2:1 ratio  during the acute phase of 34 stroke-like episodes [47]. L-arginine was associated with a statistically significant short-term (24 hours) improvement in clinical symptoms. In the same study, oral l-arginine (4 to 24 g daily) was given to six patients with MELAS for 18 months, with a reduction in the frequency and severity of stroke symptoms compared with baseline [47]. While these data appear promising, rigorous controlled trials are needed to establish whether arginine treatment provides lasting benefit in MELAS. Drugs to avoid - Certain drugs may interfere with respiratory chain function, including valproic acid and its derivatives, barbiturates, tetracyclines, and chloramphenicol. These medications generally should be avoided for patients with primary mitochondrial disorders. In addition, aminoglycosides should be avoided in patients with multisystem mitochondrial disorders because such patients are at increased risk of hearing loss. (See "Pathogenesis and prevention of aminoglycoside nephrotoxicity and  ototoxicity".) Metformin should also be avoided because its major toxicity is lactic acidosis. This may be particularly important for patients with mitochondrial disorders who have undetected cardiomyopathy, a condition that probably increases the risk of metformin-induced lactic acidosis. (See "Metformin poisoning", section on 'Lactic acidosis'.) SUMMARY AND RECOMMENDATIONS The mainstay of current treatment for patients with mitochondrial disease remains supportive. Issues that may require evaluation and intervention include respiratory failure, seizures, cardiomyopathy and conduction defects, ophthalmoplegia, ptosis, cataracts, hearing loss, diabetes mellitus, neurologic deficits (including dysarthria, dysphagia, weakness, spasticity, and/or ataxia) and cognitive impairment. (See 'Symptomatic therapy' above.)  For patients with mitochondrial disorders who are able to participate in physical activity, we suggest routine, moderate level aerobic exercise and regular mild resistance training (Grade 2C). Options for aerobic exercise include walking, running, cycling, or swimming. (See 'Exercise' above.)  There is no proven pharmacologic therapy for the primary mitochondrial disorders. (See 'Pharmacologic therapy' above.)  For most patients with mitochondrial disorders, we suggest combination treatment with coenzyme Q10, creatine, and L-carnitine (Grade 2C). Our typical regimen for adults and adolescents is coenzyme Q10 400 mg daily, creatine 4 to 10 g daily in three divided doses, and L-carnitine 990 mg daily in three divided doses. (See 'Coenzyme Q10' above and 'Creatine' above and 'Carnitine' above.)  For patients with coenzyme Q10 deficiency, we recommend high-dose oral coenzyme Q10 treatment (Grade 1B). (See 'Coenzyme Q10 deficiency' above.)  Patients with primary mitochondrial disorders should avoid certain drugs if possible, including valproic acid and its derivatives, barbiturates, tetracyclines,  chloramphenicol, aminoglycosides, and metformin. (See 'Drugs to avoid' above.) Use of [employer]ToDate is subject to the Subscription and License Agreement.  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

## 2012-03-03 DIAGNOSIS — H43819 Vitreous degeneration, unspecified eye: Secondary | ICD-10-CM | POA: Insufficient documentation

## 2012-03-03 DIAGNOSIS — IMO0002 Reserved for concepts with insufficient information to code with codable children: Secondary | ICD-10-CM | POA: Insufficient documentation

## 2012-03-03 DIAGNOSIS — D3130 Benign neoplasm of unspecified choroid: Secondary | ICD-10-CM | POA: Insufficient documentation

## 2012-03-03 DIAGNOSIS — Z8669 Personal history of other diseases of the nervous system and sense organs: Secondary | ICD-10-CM | POA: Insufficient documentation

## 2012-04-06 ENCOUNTER — Telehealth: Payer: Self-pay | Admitting: Family Medicine

## 2012-04-06 ENCOUNTER — Ambulatory Visit (INDEPENDENT_AMBULATORY_CARE_PROVIDER_SITE_OTHER): Payer: Medicare Other | Admitting: Internal Medicine

## 2012-04-06 ENCOUNTER — Other Ambulatory Visit: Payer: Self-pay | Admitting: Internal Medicine

## 2012-04-06 VITALS — BP 116/73 | HR 90 | Temp 98.0°F | Resp 16 | Ht 62.0 in | Wt 133.0 lb

## 2012-04-06 DIAGNOSIS — G252 Other specified forms of tremor: Secondary | ICD-10-CM

## 2012-04-06 DIAGNOSIS — D7589 Other specified diseases of blood and blood-forming organs: Secondary | ICD-10-CM

## 2012-04-06 DIAGNOSIS — Z72820 Sleep deprivation: Secondary | ICD-10-CM

## 2012-04-06 DIAGNOSIS — M545 Low back pain, unspecified: Secondary | ICD-10-CM

## 2012-04-06 DIAGNOSIS — R5381 Other malaise: Secondary | ICD-10-CM

## 2012-04-06 DIAGNOSIS — D751 Secondary polycythemia: Secondary | ICD-10-CM

## 2012-04-06 DIAGNOSIS — K589 Irritable bowel syndrome without diarrhea: Secondary | ICD-10-CM

## 2012-04-06 DIAGNOSIS — G729 Myopathy, unspecified: Secondary | ICD-10-CM

## 2012-04-06 DIAGNOSIS — G250 Essential tremor: Secondary | ICD-10-CM

## 2012-04-06 DIAGNOSIS — F411 Generalized anxiety disorder: Secondary | ICD-10-CM

## 2012-04-06 DIAGNOSIS — G4700 Insomnia, unspecified: Secondary | ICD-10-CM

## 2012-04-06 LAB — CBC WITH DIFFERENTIAL/PLATELET
Basophils Absolute: 0 10*3/uL (ref 0.0–0.1)
Basophils Relative: 0 % (ref 0–1)
Eosinophils Absolute: 0.1 10*3/uL (ref 0.0–0.7)
Eosinophils Relative: 1 % (ref 0–5)
HCT: 51.5 % — ABNORMAL HIGH (ref 36.0–46.0)
Hemoglobin: 18.3 g/dL — ABNORMAL HIGH (ref 12.0–15.0)
Lymphocytes Relative: 20 % (ref 12–46)
Lymphs Abs: 1.5 10*3/uL (ref 0.7–4.0)
MCH: 36.7 pg — ABNORMAL HIGH (ref 26.0–34.0)
MCHC: 35.5 g/dL (ref 30.0–36.0)
MCV: 103.2 fL — ABNORMAL HIGH (ref 78.0–100.0)
Monocytes Absolute: 0.6 10*3/uL (ref 0.1–1.0)
Monocytes Relative: 8 % (ref 3–12)
Neutro Abs: 5.2 10*3/uL (ref 1.7–7.7)
Neutrophils Relative %: 71 % (ref 43–77)
Platelets: 176 10*3/uL (ref 150–400)
RBC: 4.99 MIL/uL (ref 3.87–5.11)
RDW: 13.5 % (ref 11.5–15.5)
WBC: 7.4 10*3/uL (ref 4.0–10.5)

## 2012-04-06 LAB — COMPREHENSIVE METABOLIC PANEL
ALT: 45 U/L — ABNORMAL HIGH (ref 0–35)
AST: 47 U/L — ABNORMAL HIGH (ref 0–37)
Albumin: 4.1 g/dL (ref 3.5–5.2)
Alkaline Phosphatase: 74 U/L (ref 39–117)
BUN: 8 mg/dL (ref 6–23)
CO2: 24 mEq/L (ref 19–32)
Calcium: 9.4 mg/dL (ref 8.4–10.5)
Chloride: 103 mEq/L (ref 96–112)
Creat: 0.68 mg/dL (ref 0.50–1.10)
Glucose, Bld: 101 mg/dL — ABNORMAL HIGH (ref 70–99)
Potassium: 4 mEq/L (ref 3.5–5.3)
Sodium: 139 mEq/L (ref 135–145)
Total Bilirubin: 1.7 mg/dL — ABNORMAL HIGH (ref 0.3–1.2)
Total Protein: 6.8 g/dL (ref 6.0–8.3)

## 2012-04-06 LAB — CK: Total CK: 23 U/L (ref 7–177)

## 2012-04-06 MED ORDER — DIPHENOXYLATE-ATROPINE 2.5-0.025 MG PO TABS: 1.0000 | ORAL_TABLET | Freq: Four times a day (QID) | ORAL | Status: DC | PRN

## 2012-04-06 MED ORDER — CILIDINIUM-CHLORDIAZEPOXIDE 2.5-5 MG PO CAPS: 1.0000 | ORAL_CAPSULE | Freq: Once | ORAL | Status: DC | PRN

## 2012-04-06 NOTE — Progress Notes (Addendum)
  Subjective:    Patient ID: [name], female    DOB: July 24, 1945, 67 y.o.   MRN: [id number]  HPIhere w/ GI complaints ongoing for a long time. Dx w/ IBS. 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 Diarrhea for 4-5 hrs each am and after eating Lots of abd pain from cramping. Coconut macaroons ([facility]op pharm)helps Recent colonoscopy wnl Given dicyclomine but it gives cns side effects Unclear if ativan helps GI sxt-takes mainly hs  Also c/o daytime sleepiness and fatigue. Says husband has noticed apnea during sleep.  No longer followed at [facility] for Mit. 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(which always made tremors better)-only B12 in chart was over 200  Also dx polycythemia-Abd [place] =liver wnl-one lLFT abn(Dr Juanda Chance) Review of Systems No wt loss No fever ,night sweats She has a tremor that improves with B12 shots-Pernic anemia is listed on her prob list but dx not clear No low b12 values/actually no recent labs-on oral b12 now///past macrocytosis w/out anemia Also notes freq low back pain, in lower thoracic area--never evaluated-was told by er doc she has spine arthritis seen on ct done for anther reason/no radic sxtoms    Objective:   Physical Exam BP 116/73  Pulse 90  Temp(Src) 98 F (36.7 C)  Resp 16  Ht 5\' 2"  (1.575 m)  Wt 133 lb (60.328 kg)  BMI 24.32 kg/m2 HEENT clear Abd- no omeg/masses(see recent eval Dr Juanda Chance) Herbert Pun sl tender/slr wnl     Assessment & Plan:  TREMOR, ESSENTIAL/FAMILIAL  - Plan: B12 and Folate Panel  UNSPECIFIED MYOPATHY  - Plan: Comprehensive metabolic panel, CK, Sedimentation Rate,  Macrocytosis without anemia  - Plan: B12 and Folate Panel, Sedimentation Rate, Vitamin B12, Folate  Low back pain-thoracic--to evaluate later(hx  osteopor)  Irritable bowel syndrome/Diarrhea  - Plan: Sedimentation Rate, Vitamin B12, Folate, trial ativan, trial lomotil, trial librax  INSOMNIA UNSPECIFIED /Sleep deprivation/ fatigue--to Dr Marylou Flesher for evaluation  ANXIETY-if ativan helps diarrhea, would consider SSRI  Meds ordered this encounter  Medications  . diphenoxylate-atropine (LOMOTIL) 2.5-0.025 MG per tablet    Sig: Take 1 tablet by mouth 4 (four) times daily as needed for diarrhea or loose stools.    Dispense:  30 tablet    Refill:  2  . clidinium-chlordiazePOXIDE (LIBRAX) 2.5-5 MG per capsule    Sig: Take 1 capsule by mouth once as needed.    Dispense:  3 capsule    Refill:  0   F/u 6mo    Addendum: Labs show Hgb >18 w/ nl WBC and Plt  Mildly Abn LFTs w/ neg Hep C Ab, HBsAg  Normal B12 on oral B12 supplements She needs Heme consult to further evaluate this-the RBCs continue to elevate-?more extensive search for underlying cause  Dx of PA seems unlikely

## 2012-04-06 NOTE — Telephone Encounter (Signed)
Pt.notified

## 2012-04-07 LAB — TSH: TSH: 2.076 u[IU]/mL (ref 0.350–4.500)

## 2012-04-07 LAB — VITAMIN B12: Vitamin B-12: 334 pg/mL (ref 211–911)

## 2012-04-07 LAB — FOLATE: Folate: 6.3 ng/mL

## 2012-04-07 LAB — HEPATITIS C ANTIBODY: HCV Ab: NEGATIVE

## 2012-04-07 LAB — SEDIMENTATION RATE: Sed Rate: 1 mm/hr (ref 0–22)

## 2012-04-07 LAB — HEPATITIS B SURFACE ANTIGEN: Hepatitis B Surface Ag: NEGATIVE

## 2012-04-11 ENCOUNTER — Encounter: Payer: Self-pay | Admitting: Internal Medicine

## 2012-04-11 DIAGNOSIS — D751 Secondary polycythemia: Secondary | ICD-10-CM | POA: Insufficient documentation

## 2012-04-11 NOTE — Addendum Note (Signed)
Addended by: Tonye Pearson on: 04/11/2012 12:21 PM   Modules accepted: Orders

## 2012-04-21 ENCOUNTER — Telehealth: Payer: Self-pay | Admitting: Hematology & Oncology

## 2012-04-21 NOTE — Telephone Encounter (Signed)
Pt aware of 4-17 appointment

## 2012-04-22 ENCOUNTER — Telehealth: Payer: Self-pay | Admitting: Hematology & Oncology

## 2012-04-22 NOTE — Telephone Encounter (Signed)
Pt moved 4-17 to 4-24

## 2012-05-04 ENCOUNTER — Ambulatory Visit: Payer: Medicare Other | Admitting: Internal Medicine

## 2012-05-12 ENCOUNTER — Ambulatory Visit: Payer: Medicare Other | Admitting: Hematology & Oncology

## 2012-05-12 ENCOUNTER — Other Ambulatory Visit: Payer: Medicare Other | Admitting: Lab

## 2012-05-12 ENCOUNTER — Ambulatory Visit: Payer: Medicare Other

## 2012-05-19 ENCOUNTER — Ambulatory Visit (HOSPITAL_BASED_OUTPATIENT_CLINIC_OR_DEPARTMENT_OTHER): Payer: Medicare Other

## 2012-05-19 ENCOUNTER — Other Ambulatory Visit (HOSPITAL_BASED_OUTPATIENT_CLINIC_OR_DEPARTMENT_OTHER): Payer: Medicare Other | Admitting: Lab

## 2012-05-19 ENCOUNTER — Ambulatory Visit (HOSPITAL_BASED_OUTPATIENT_CLINIC_OR_DEPARTMENT_OTHER): Payer: Medicare Other | Admitting: Hematology & Oncology

## 2012-05-19 VITALS — BP 129/65 | HR 81 | Temp 98.0°F | Resp 16 | Ht 62.0 in | Wt 131.0 lb

## 2012-05-19 DIAGNOSIS — D751 Secondary polycythemia: Secondary | ICD-10-CM

## 2012-05-19 DIAGNOSIS — J45909 Unspecified asthma, uncomplicated: Secondary | ICD-10-CM

## 2012-05-19 LAB — CBC WITH DIFFERENTIAL (CANCER CENTER ONLY)
BASO#: 0 10*3/uL (ref 0.0–0.2)
BASO%: 0.4 % (ref 0.0–2.0)
EOS%: 1.2 % (ref 0.0–7.0)
Eosinophils Absolute: 0.1 10*3/uL (ref 0.0–0.5)
HCT: 52.7 % — ABNORMAL HIGH (ref 34.8–46.6)
HGB: 18.5 g/dL — ABNORMAL HIGH (ref 11.6–15.9)
LYMPH#: 1.3 10*3/uL (ref 0.9–3.3)
LYMPH%: 15.7 % (ref 14.0–48.0)
MCH: 37.7 pg — ABNORMAL HIGH (ref 26.0–34.0)
MCHC: 35.1 g/dL (ref 32.0–36.0)
MCV: 107 fL — ABNORMAL HIGH (ref 81–101)
MONO#: 0.8 10*3/uL (ref 0.1–0.9)
MONO%: 9.6 % (ref 0.0–13.0)
NEUT#: 6.1 10*3/uL (ref 1.5–6.5)
NEUT%: 73.1 % (ref 39.6–80.0)
Platelets: 145 10*3/uL (ref 145–400)
RBC: 4.91 10*6/uL (ref 3.70–5.32)
RDW: 13 % (ref 11.1–15.7)
WBC: 8.4 10*3/uL (ref 3.9–10.0)

## 2012-05-19 LAB — CHCC SATELLITE - SMEAR

## 2012-05-19 NOTE — Progress Notes (Signed)
This office note has been dictated.

## 2012-05-20 NOTE — Progress Notes (Signed)
CC:   Robert P. Merla Riches, M.D.  DIAGNOSES: 1. Erythrocytosis-possible polycythemia. 2. Mitochondrial myopathy.  HISTORY OF PRESENT ILLNESS:  Ms. Fambrough is a very nice 67 year old white female.  She apparently has been diagnosed with a mitochondrial myopathy.  She said she did have biopsies.  She is not on any kind of treatment for this.  She is followed by Dr. Merla Riches.  She does have chronic pain, tremors, anxiety.  Dr. Merla Riches has noted that she has had an elevated hemoglobin and hematocrit.  Going through the lab work, back in 2010, her hemoglobin was 15.3 and hematocrit was 44.8.  Her MCV was elevated at that time to 102.  Her white cell count and platelet count were normal.  In 2012, her hemoglobin was up to 17.2 with hematocrit of 47.3.  MCV was 104.  Again, she had a normal white cell count and platelets.  She had normal white cell differential.  Most recently, her white cell count was 7.4, hemoglobin 18.3, hematocrit 51.5, platelet count 176.  MCV was 103.  White cell differential was 71 segs, 20 lymphs, 8 monos, 1 eosinophil.  Because of this, she was kindly referred to the [facility] for an evaluation.  She had a vitamin B12 level done back in March and this was normal at 334.  Electrolytes that were done back in March also appeared unremarkable.  Total bilirubin was mildly elevated at 1.7.  Albumin was 4.1 with a calcium of 9.4.  BUN and creatinine were 8 and 0.68.  She does feel tired.  She feels very sluggish.  She gets weakened quite easily.  She has occasional headaches.  She said that she feels "sharp."  She just does not have much in the way of energy.  There is a lot of stress at home.  Her husband was found have a localized prostate cancer.  He apparently is going to go to [place], [place], this summer for proton beam therapy.  She denies any pruritus.  There is no dyspepsia.  She has no nausea or vomiting.  Again, there  has been no change in bowel or bladder habits.  Again, she was kindly referred to the [facility] for an evaluation.  PAST MEDICAL HISTORY: 1. Remarkable for the mitochondrial myopathy. 2. Essential tremor. 3. Irritable bowel syndrome. 4. Anxiety. 5. Insomnia.  ALLERGIES:  None.  MEDICATIONS:  Vivelle patch 0.025 mg twice a week, Ativan 0.5 mg p.o. q.12 hours p.r.n., Toprol XL 25 mg p.o. daily p.r.n., Prometrium 100 mg p.o. daily, Imitrex 50 mg p.o. q.2 hours p.r.n. migraine, albuterol inhaler 2 puffs q.6 hours p.r.n.  SOCIAL HISTORY:  Negative for tobacco use.  She has rare alcohol use. There are no obvious occupational exposures.  FAMILY HISTORY:  Noncontributory.  She is not aware of anybody in the family with any kind of blood problems.  REVIEW OF SYSTEMS:  As stated in the history of present illness.  No additional findings noted on a 12-system review.  PHYSICAL EXAMINATION:  General:  This is a petite white female in no obvious distress.  Vital signs:  Temperature of 98, pulse 81, respiratory rate 16, blood pressure 129/65.  Weight is 131.  Head and neck:  Normocephalic, atraumatic skull.  She does have some facial plethora.  There may be some slight conjunctival inflammation.  There are no intraoral lesions.  Tongue is not swollen.  No adenopathy is noted in her neck.  Thyroid is not palpable.  Lungs:  Clear  bilaterally. Cardiac:  Regular rate and rhythm with a normal S1 and S2.  There are no murmurs, rubs, or bruits.  Abdomen:  Soft with good bowel sounds.  There is no palpable abdominal mass.  There is no fluid wave.  There is no palpable hepatosplenomegaly.  Back:  Some slight kyphosis.  There is no tenderness over the spine, ribs, or hips.  Extremities:  No clubbing, cyanosis, or edema.  There may be some slight tenderness to palpation over her long bones.  She has good pulses in her distal extremities. Neurological:  Shows a tremor.  Skin:   Does show a little bit of a "ruddy" complexion to his skin.  She definitely has significant vein enlargement on her skin.  LABORATORY STUDIES:  White cell count is 8.4, hemoglobin 18.5, hematocrit 52.7, platelet count 145.  MCV is 107.  Peripheral smear shows a somewhat macrocytic population of red blood cells.  There are no nucleated red blood cells.  I see no rouleaux formation.  There are no inclusion bodies.  I see no schistocytes. There is no polychromasia.  White cells appear with increased hypersegmented polys.  I do not see any immature myeloid cells.  There is a slight increase in monocytes.  I do not see any blasts.  Platelets are adequate in number and size.  IMPRESSION:  Ms. Brossart is a very nice 67 year old white female with mitochondrial myopathy.  She is pretty well dealing with this, which is nice to see.  I have noticed that the trend clearly over the past few years is for her hemoglobin to increase.  She is not a smoker.  I do not see this as being a physiologic response. She does not have chronic obstructive pulmonary disease, so I do not think chronic hypoxia would be an issue.  One has to wonder about polycythemia.  The only potential issue is that she does not have a low MCV.  Typically, patients with polycythemia are iron-deficient and have decreased MCVs.  She definitely is not B12 deficient.  I suspect that we might be looking at a primary marrow disorder.  She does have that "complexion" that we see with polycythemia.  I think her symptoms would suggest that she has some "sluggish" blood flow.  She may be a good candidate for phlebotomy if we do find that she is polycythemic.  We will have to see what the JAK2 assay looks like.  I will see what her erythropoietin level is.  I am checking iron studies.  I find this quite fascinating.  Hopefully, we will not have to do a bone marrow test on her.  I did give her a prayer blanket.  Her favorite color  is green, so I had a nice green prayer blanket, which she very much appreciated.  I spent a good hour with her.  We will get her back once I get the results back from her lab work.    ______________________________ Josph Macho, M.D. PRE/MEDQ  D:  05/19/2012  T:  05/20/2012  Job:  [id number]

## 2012-05-24 LAB — CANCER ANTIGEN 19-9: CA 19-9: 18 U/mL (ref <35.0)

## 2012-05-24 LAB — COMPREHENSIVE METABOLIC PANEL
ALT: 44 U/L — ABNORMAL HIGH (ref 0–35)
AST: 46 U/L — ABNORMAL HIGH (ref 0–37)
Albumin: 3.8 g/dL (ref 3.5–5.2)
Alkaline Phosphatase: 76 U/L (ref 39–117)
BUN: 8 mg/dL (ref 6–23)
CO2: 23 mEq/L (ref 19–32)
Calcium: 9.2 mg/dL (ref 8.4–10.5)
Chloride: 104 mEq/L (ref 96–112)
Creatinine, Ser: 0.59 mg/dL (ref 0.50–1.10)
Glucose, Bld: 89 mg/dL (ref 70–99)
Potassium: 3.9 mEq/L (ref 3.5–5.3)
Sodium: 139 mEq/L (ref 135–145)
Total Bilirubin: 1.9 mg/dL — ABNORMAL HIGH (ref 0.3–1.2)
Total Protein: 6.6 g/dL (ref 6.0–8.3)

## 2012-05-24 LAB — RETICULOCYTES (CHCC)
ABS Retic: 85.7 10*3/uL (ref 19.0–186.0)
RBC.: 5.04 MIL/uL (ref 3.87–5.11)
Retic Ct Pct: 1.7 % (ref 0.4–2.3)

## 2012-05-24 LAB — HEMOGLOBINOPATHY EVALUATION
Hemoglobin Other: 0 %
Hgb A2 Quant: 2.6 % (ref 2.2–3.2)
Hgb A: 97.4 % (ref 96.8–97.8)
Hgb F Quant: 0 % (ref 0.0–2.0)
Hgb S Quant: 0 %

## 2012-05-24 LAB — IRON AND TIBC
%SAT: 49 % (ref 20–55)
Iron: 138 ug/dL (ref 42–145)
TIBC: 283 ug/dL (ref 250–470)
UIBC: 145 ug/dL (ref 125–400)

## 2012-05-24 LAB — VITAMIN B12: Vitamin B-12: 462 pg/mL (ref 211–911)

## 2012-05-24 LAB — ERYTHROPOIETIN: Erythropoietin: 3.2 m[IU]/mL (ref 2.6–18.5)

## 2012-05-24 LAB — FERRITIN: Ferritin: 281 ng/mL (ref 10–291)

## 2012-05-25 ENCOUNTER — Ambulatory Visit (INDEPENDENT_AMBULATORY_CARE_PROVIDER_SITE_OTHER): Payer: Medicare Other | Admitting: Internal Medicine

## 2012-05-25 VITALS — BP 121/77 | HR 88 | Temp 98.0°F | Resp 16 | Ht 63.0 in | Wt 131.0 lb

## 2012-05-25 DIAGNOSIS — G4700 Insomnia, unspecified: Secondary | ICD-10-CM

## 2012-05-25 DIAGNOSIS — F411 Generalized anxiety disorder: Secondary | ICD-10-CM

## 2012-05-25 DIAGNOSIS — D45 Polycythemia vera: Secondary | ICD-10-CM

## 2012-05-25 DIAGNOSIS — K589 Irritable bowel syndrome without diarrhea: Secondary | ICD-10-CM

## 2012-05-25 DIAGNOSIS — D7589 Other specified diseases of blood and blood-forming organs: Secondary | ICD-10-CM

## 2012-05-25 DIAGNOSIS — D751 Secondary polycythemia: Secondary | ICD-10-CM

## 2012-05-25 MED ORDER — LORAZEPAM 0.5 MG PO TABS: 0.5000 mg | ORAL_TABLET | Freq: Two times a day (BID) | ORAL | Status: DC | PRN

## 2012-05-25 NOTE — Progress Notes (Signed)
  Subjective:    Patient ID: [name], female    DOB: [birth year], 67 y.o.   MRN: [id number]  HPI f/u Patient Active Problem List   Diagnosis Date Noted  . Polycythemia--see notes Dr Myna Hidalgo 04/11/2012  . Macrocytosis without anemia 04/06/2012  . Low back pain 06/03/2011  . Back pain 06/03/2011  . Chest pain 09/28/2010  . Personal history of endocrine/metabolic/immunity disorder 04/04/2010  . Encounter for long-term (current) use of medications 04/04/2010  . METATARSALGIA 03/18/2010  . IDIOPATHIC OSTEOPOROSIS 03/18/2010  . INSOMNIA UNSPECIFIED 08/13/2009  . ANXIETY--mainly in the evening as the day settles//greatly improved with Ativan at night //also leads to a good sleep and a better next day  11/29/2008  . ILIOTIBIAL BAND SYNDROME, LEFT KNEE 08/23/2008  . MIGRAINE HEADACHE 07/28/2007  . UNSPECIFIED MYOPATHY 07/28/2007  . 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 TREMOR, ESSENTIAL/FAMILIAL 03/25/2006  . ASTHMA, UNSPECIFIED 03/25/2006  . IRRITABLE BOWEL SYNDROME--very good response to Lomotil taken just at bedtime occasionally /will have a good response for 3-4 days  03/25/2006  . MENOPAUSAL SYNDROME 03/25/2006   To [place] with husband soon for 8 weeks of proton beam therapy for prostate cancer    Review of Systems Noncontributory    Objective:   Physical Exam BP 121/77  Pulse 88  Temp(Src) 98 F (36.7 C)  Resp 16  Ht 5\' 3"  (1.6 m)  Wt 131 lb (59.421 kg)  BMI 23.21 kg/m2 Mood very good/affect appropriate       Assessment & Plan:  GAD (generalized anxiety disorder) - Plan: LORazepam (ATIVAN) 0.5 MG tablet continued/may call for refills and followup in 6 months  Polycythemia Macrocytosis without anemia  Irritable bowel syndrome-may call for refills of Lomotil if needed  INSOMNIA stable  Meds ordered this encounter  Medications  . diphenoxylate-atropine (LOMOTIL) 2.5-0.025 MG per tablet    Sig: Take 1 tablet by mouth 4 (four) times daily as needed for  diarrhea or loose stools.  Marland Kitchen LORazepam (ATIVAN) 0.5 MG tablet    Sig: Take 1 tablet (0.5 mg total) by mouth every 12 (twelve) hours as needed for anxiety.    Dispense:  60 tablet    Refill:  5

## 2012-05-26 ENCOUNTER — Telehealth: Payer: Self-pay | Admitting: Hematology & Oncology

## 2012-05-26 ENCOUNTER — Other Ambulatory Visit: Payer: Self-pay | Admitting: Hematology & Oncology

## 2012-05-26 DIAGNOSIS — D751 Secondary polycythemia: Secondary | ICD-10-CM

## 2012-05-26 NOTE — Telephone Encounter (Signed)
Pt aware of 5-8,15 and 23rd appointments

## 2012-06-02 ENCOUNTER — Ambulatory Visit (HOSPITAL_BASED_OUTPATIENT_CLINIC_OR_DEPARTMENT_OTHER): Payer: Medicare Other

## 2012-06-02 VITALS — BP 108/64 | HR 68 | Temp 96.9°F | Resp 20

## 2012-06-02 DIAGNOSIS — D751 Secondary polycythemia: Secondary | ICD-10-CM

## 2012-06-02 NOTE — Patient Instructions (Signed)

## 2012-06-02 NOTE — Progress Notes (Signed)
[name] presents today for phlebotomy per MD orders. Phlebotomy procedure started at 1400 and ended at 1430. Approximately 500 mls removed. Patient observed for 30 minutes after procedure without any incident. Patient tolerated procedure well. IV needle removed intact.

## 2012-06-09 ENCOUNTER — Ambulatory Visit (HOSPITAL_BASED_OUTPATIENT_CLINIC_OR_DEPARTMENT_OTHER): Payer: Medicare Other

## 2012-06-09 VITALS — BP 112/73 | HR 89 | Temp 97.0°F | Resp 20

## 2012-06-09 DIAGNOSIS — D751 Secondary polycythemia: Secondary | ICD-10-CM

## 2012-06-09 DIAGNOSIS — D510 Vitamin B12 deficiency anemia due to intrinsic factor deficiency: Secondary | ICD-10-CM

## 2012-06-09 NOTE — Progress Notes (Signed)
Larwance Sachs presents today for phlebotomy per MD orders. Phlebotomy procedure started at 1400 and ended at 1410. 500 grams removed. Patient observed for 30 minutes after procedure without any incident. Patient tolerated procedure well. IV needle removed intact.

## 2012-06-09 NOTE — Patient Instructions (Addendum)

## 2012-06-17 ENCOUNTER — Other Ambulatory Visit (HOSPITAL_BASED_OUTPATIENT_CLINIC_OR_DEPARTMENT_OTHER): Payer: Medicare Other | Admitting: Lab

## 2012-06-17 ENCOUNTER — Ambulatory Visit: Payer: Medicare Other

## 2012-06-17 ENCOUNTER — Encounter: Payer: Self-pay | Admitting: Hematology & Oncology

## 2012-06-17 ENCOUNTER — Ambulatory Visit (HOSPITAL_BASED_OUTPATIENT_CLINIC_OR_DEPARTMENT_OTHER): Payer: Medicare Other | Admitting: Hematology & Oncology

## 2012-06-17 VITALS — BP 126/57 | HR 91 | Temp 97.9°F | Resp 16 | Ht 63.0 in | Wt 131.0 lb

## 2012-06-17 DIAGNOSIS — D751 Secondary polycythemia: Secondary | ICD-10-CM

## 2012-06-17 DIAGNOSIS — D45 Polycythemia vera: Secondary | ICD-10-CM

## 2012-06-17 HISTORY — DX: Polycythemia vera: D45

## 2012-06-17 LAB — CBC WITH DIFFERENTIAL (CANCER CENTER ONLY)
BASO#: 0 10*3/uL (ref 0.0–0.2)
BASO%: 0.4 % (ref 0.0–2.0)
EOS%: 1.8 % (ref 0.0–7.0)
Eosinophils Absolute: 0.2 10*3/uL (ref 0.0–0.5)
HCT: 43.5 % (ref 34.8–46.6)
HGB: 16 g/dL — ABNORMAL HIGH (ref 11.6–15.9)
LYMPH#: 1.6 10*3/uL (ref 0.9–3.3)
LYMPH%: 17.4 % (ref 14.0–48.0)
MCH: 38.6 pg — ABNORMAL HIGH (ref 26.0–34.0)
MCHC: 36.8 g/dL — ABNORMAL HIGH (ref 32.0–36.0)
MCV: 105 fL — ABNORMAL HIGH (ref 81–101)
MONO#: 0.7 10*3/uL (ref 0.1–0.9)
MONO%: 7.9 % (ref 0.0–13.0)
NEUT#: 6.8 10*3/uL — ABNORMAL HIGH (ref 1.5–6.5)
NEUT%: 72.5 % (ref 39.6–80.0)
Platelets: 183 10*3/uL (ref 145–400)
RBC: 4.15 10*6/uL (ref 3.70–5.32)
RDW: 12.2 % (ref 11.1–15.7)
WBC: 9.4 10*3/uL (ref 3.9–10.0)

## 2012-06-17 LAB — IRON AND TIBC
%SAT: 50 % (ref 20–55)
Iron: 140 ug/dL (ref 42–145)
TIBC: 278 ug/dL (ref 250–470)
UIBC: 138 ug/dL (ref 125–400)

## 2012-06-17 LAB — FERRITIN: Ferritin: 249 ng/mL (ref 10–291)

## 2012-06-17 NOTE — Progress Notes (Unsigned)
Patient seen by Dr. Myna Hidalgo, Hct 43.5, no indication for phlebotomy.

## 2012-06-17 NOTE — Progress Notes (Signed)
This office note has been dictated.

## 2012-06-21 NOTE — Progress Notes (Signed)
CC:   [name], M.D.  DIAGNOSES: 1. 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  CURRENT THERAPY: 1. Phlebotomy to maintain hematocrit below 45%. 2. Aspirin 81 mg p.o. daily.  INTERIM HISTORY:  [name] comes in for followup.  I think we have established a diagnosis of polycythemia vera on her.  Her JAK2 assay was negative.  However, her erythropoietin level was only 3.  Given that her hematocrit was 53 when we first saw her, one would think that the erythropoietin level would be higher.  I think studies have looked at correlating erythropoietin levels with polycythemia vera.  There is a high sensitivity rate with low erythropoietin levels with high hematocrits.  Her B12 level was 462.  Her ferritin was 281 with an iron saturation of 49%.  She has had I think 2 phlebotomies already.  She feels better.  She is not as lethargic.  She is thinking a bit more clearly.  She still has the other health issues.  She does have the mitochondrial myopathy.  I told her that I did not think that the polycythemia was related to this or vice versa.  She has had no problems with bleeding.  There has been no chest pain. There has been no nausea or vomiting.  PHYSICAL EXAMINATION:  General:  This is a petite white female in no obvious distress.  Vital signs:  Show a temperature of 97.9, pulse 91, respiratory rate 16, blood pressure 126/57.  Weight is 131.  Head and neck:  Shows a normocephalic, atraumatic skull.  There are no ocular or oral lesions.  There are no palpable cervical or supraclavicular lymph nodes.  Lungs:  Clear bilaterally.  Cardiac:  Regular rate and rhythm with a normal S1, S2.  There are no murmurs, rubs or bruits.  Abdomen: Soft with good bowel sounds.  There is no palpable abdominal mass. There is no fluid wave.  There is no palpable hepatosplenomegaly. Extremities:  Show no clubbing, cyanosis or edema.  She has good range of motion of her  joints.  Back:  Shows some slight kyphosis. Neurological:  Shows no focal neurological deficits.  LABORATORY STUDIES:  White cell count is 9.4, hemoglobin 16, hematocrit 43.5, platelet count is 183.  MCV is 105.  IMPRESSION:  [name] is a nice 67 year old white female.  I feel that she does have polycythemia.  Again, her erythropoietin level was incredibly low.  We are phlebotomizing her.  She does not need to be phlebotomized today.  We will see what her iron studies show.  I spoke to her and her husband and her friend.  I explained to them the situation.  I explained to them why we need to keep the hematocrit below 45%.  We will plan to get her back in another 4 weeks or so.  She and her husband are planning to go to [place] for a little bit in July.  We will try to time her appointments around her traveling.    ______________________________ [name], M.D. PRE/MEDQ  D:  06/17/2012  T:  06/18/2012  Job:  [id number]

## 2012-07-21 ENCOUNTER — Ambulatory Visit (HOSPITAL_BASED_OUTPATIENT_CLINIC_OR_DEPARTMENT_OTHER): Payer: Medicare Other

## 2012-07-21 ENCOUNTER — Other Ambulatory Visit (HOSPITAL_BASED_OUTPATIENT_CLINIC_OR_DEPARTMENT_OTHER): Payer: Medicare Other

## 2012-07-21 ENCOUNTER — Other Ambulatory Visit: Payer: Medicare Other | Admitting: Lab

## 2012-07-21 ENCOUNTER — Ambulatory Visit (HOSPITAL_BASED_OUTPATIENT_CLINIC_OR_DEPARTMENT_OTHER): Payer: Medicare Other | Admitting: Hematology & Oncology

## 2012-07-21 VITALS — BP 117/58 | HR 82 | Temp 98.4°F | Resp 16 | Ht 63.0 in | Wt 130.0 lb

## 2012-07-21 VITALS — BP 114/70 | HR 76 | Resp 20

## 2012-07-21 DIAGNOSIS — D45 Polycythemia vera: Secondary | ICD-10-CM

## 2012-07-21 LAB — CBC WITH DIFFERENTIAL (CANCER CENTER ONLY)
BASO#: 0.1 10*3/uL (ref 0.0–0.2)
BASO%: 0.6 % (ref 0.0–2.0)
EOS%: 1.1 % (ref 0.0–7.0)
Eosinophils Absolute: 0.1 10*3/uL (ref 0.0–0.5)
HCT: 48.6 % — ABNORMAL HIGH (ref 34.8–46.6)
HGB: 16.9 g/dL — ABNORMAL HIGH (ref 11.6–15.9)
LYMPH#: 1.5 10*3/uL (ref 0.9–3.3)
LYMPH%: 18.1 % (ref 14.0–48.0)
MCH: 37.2 pg — ABNORMAL HIGH (ref 26.0–34.0)
MCHC: 34.8 g/dL (ref 32.0–36.0)
MCV: 107 fL — ABNORMAL HIGH (ref 81–101)
MONO#: 0.8 10*3/uL (ref 0.1–0.9)
MONO%: 9.9 % (ref 0.0–13.0)
NEUT#: 5.7 10*3/uL (ref 1.5–6.5)
NEUT%: 70.3 % (ref 39.6–80.0)
Platelets: 188 10*3/uL (ref 145–400)
RBC: 4.54 10*6/uL (ref 3.70–5.32)
RDW: 11.7 % (ref 11.1–15.7)
WBC: 8.2 10*3/uL (ref 3.9–10.0)

## 2012-07-21 NOTE — Progress Notes (Signed)
This office note has been dictated.

## 2012-07-21 NOTE — Progress Notes (Signed)
[name] presents today for phlebotomy per MD orders. Phlebotomy procedure started at 1455 and ended at 46. Approximately removed. Patient observed for 30 minutes after procedure without any incident. Patient tolerated procedure well. IV needle removed intact.

## 2012-07-21 NOTE — Patient Instructions (Signed)

## 2012-07-22 ENCOUNTER — Telehealth: Payer: Self-pay | Admitting: Hematology & Oncology

## 2012-07-22 LAB — FERRITIN: Ferritin: 246 ng/mL (ref 10–291)

## 2012-07-22 LAB — ERYTHROPOIETIN: Erythropoietin: 5 m[IU]/mL (ref 2.6–18.5)

## 2012-07-22 NOTE — Telephone Encounter (Signed)
PT CALLED MADE 9-4 APPOINTMENT SHE SAID THAT WOULD BE BETTER FOR HER THAN AUGUST.

## 2012-07-22 NOTE — Progress Notes (Signed)
CC:   Robert P. Merla Riches, M.D.  DIAGNOSIS:  Polycythemia vera, JAK2 negative.  CURRENT THERAPY: 1. Phlebotomy to maintain hematocrit below 45%. 2. Aspirin 81 mg p.o. daily.  INTERIM HISTORY:  [name] comes in for followup.  She is doing okay. She does feel a little tired.  She and her husband have doing quite a bit.  They are getting ready for him to go down to [place] for his prostate cancer therapy.  She seems to be doing well with the phlebotomies.  When we last saw her back in May, her hematocrit was 43.5.  She does feel somewhat tired.  Some of this may be from a low iron.  She feels that her blood count is going back up again.  She has had no leg swelling.  There has been no double vision or blurred vision.  She has this mitochondrial myopathy.  This does not seem to be "flaring up."  PHYSICAL EXAM:  General:  This is a well-developed, well-nourished white female in no obvious distress.  Vital signs:  Temperature of 98.4, pulse 82, respiratory rate 16, blood pressure 117/58.  Weight is 130 pounds. Head and neck:  Normocephalic, atraumatic skull.  There are no ocular or oral lesions.  There are no palpable cervical or supraclavicular lymph nodes.  Lungs:  Clear bilaterally.  Cardiac:  Regular rate and rhythm with a normal S1, S2.  There are no murmurs, rubs, or bruits.  Abdomen: Soft with good bowel sounds.  There is no palpable abdominal mass. There is no fluid wave.  There is no palpable hepatosplenomegaly.  Back: No tenderness over the spine, ribs, or hips.  Extremities:  Show no clubbing, cyanosis, or edema.  Skin:  Does show some slight skin plethora.  She has a little bit of a ruddy complexion to her skin.  LABORATORY STUDIES:  White cell count is 8.2, hemoglobin 16.9, hematocrit 48.6, platelet count 188.  MCV is 107.  IMPRESSION:  [name] is a very nice 67 year old white female with polycythemia.  I have to believe that she does have polycythemia,  although she is JAK2 negative, by virtue of her very, very low grade erythropoietin level in view of the fact that her hematocrit was 53%.  Her blood counts are going back up again.  I think this is just significant for her bone marrow showing some autonomy.  We will have to phlebotomize her today.  She and her husband are heading down to [place] next week.  Again, they will be gone for 2 months.  She is worried about her blood going back up too high and then having some complications.  She is wondering if we could get an appointment for her at the [facility]  I think that this is a good idea.  We can probably try to get one set up for her for the end of July.  I will see what we can do to make an appointment for her while she is down there.  We will plan to see [name] when she comes back in about 2 months. She will let us know when she returns.  Again, I have to believe that she does have polycythemia.  Even though she is JAK2 negative, her low erythropoietin level is relatively a good indicator for polycythemia.    ______________________________ Josph Macho, M.D. PRE/MEDQ  D:  07/21/2012  T:  07/22/2012  Job:  [id number]

## 2012-08-18 ENCOUNTER — Telehealth: Payer: Self-pay | Admitting: Hematology & Oncology

## 2012-08-18 NOTE — Telephone Encounter (Signed)
Faxed Medical Records via fax today  to:  [facility] Attn: Cephus Shelling NCMA [address] [place], [place]  [address] Ph: [phone] Fx: [phone]  Medical  Records requested phlebotomy order and correlating office notes  from 2014  CONSENT COPY SCANNED

## 2012-08-22 ENCOUNTER — Telehealth: Payer: Self-pay

## 2012-08-22 ENCOUNTER — Other Ambulatory Visit: Payer: Self-pay | Admitting: Radiology

## 2012-08-22 DIAGNOSIS — D45 Polycythemia vera: Secondary | ICD-10-CM

## 2012-08-22 NOTE — Telephone Encounter (Signed)
Patients order has been placed for therapeutic phlebotomy, Dr Myna Hidalgo has already sent his information. She needs primary care referral, to you Sherlon Handing is sending the order.

## 2012-08-22 NOTE — Telephone Encounter (Signed)
Pt is calling from [facility] and is wanting to get a release from dr Merla Riches to have a blood treatment in [place] while she is there with her husband getting treatment States that we should have  Faxed a release form to her three times already and they do not have it she is wanting to know if we could email it to her.  Please call [phone] and she will give all information needed to get release

## 2012-08-22 NOTE — Telephone Encounter (Signed)
Ok for therapeutic phlebotomy

## 2012-09-01 ENCOUNTER — Telehealth: Payer: Self-pay | Admitting: Hematology & Oncology

## 2012-09-01 NOTE — Telephone Encounter (Signed)
Pt called moved 9-4 to 8-27

## 2012-09-21 ENCOUNTER — Ambulatory Visit (HOSPITAL_BASED_OUTPATIENT_CLINIC_OR_DEPARTMENT_OTHER): Payer: Medicare Other | Admitting: Hematology & Oncology

## 2012-09-21 ENCOUNTER — Other Ambulatory Visit (HOSPITAL_BASED_OUTPATIENT_CLINIC_OR_DEPARTMENT_OTHER): Payer: Medicare Other | Admitting: Lab

## 2012-09-21 ENCOUNTER — Ambulatory Visit (HOSPITAL_BASED_OUTPATIENT_CLINIC_OR_DEPARTMENT_OTHER): Payer: Medicare Other

## 2012-09-21 VITALS — BP 121/65 | HR 79 | Temp 97.8°F | Resp 14 | Ht 63.0 in | Wt 129.0 lb

## 2012-09-21 VITALS — BP 113/67 | HR 82

## 2012-09-21 DIAGNOSIS — D45 Polycythemia vera: Secondary | ICD-10-CM

## 2012-09-21 LAB — IRON AND TIBC CHCC
%SAT: 44 % (ref 21–57)
Iron: 143 ug/dL — ABNORMAL HIGH (ref 41–142)
TIBC: 328 ug/dL (ref 236–444)
UIBC: 185 ug/dL (ref 120–384)

## 2012-09-21 LAB — CBC WITH DIFFERENTIAL (CANCER CENTER ONLY)
BASO#: 0 10*3/uL (ref 0.0–0.2)
BASO%: 0.4 % (ref 0.0–2.0)
EOS%: 1.3 % (ref 0.0–7.0)
Eosinophils Absolute: 0.1 10*3/uL (ref 0.0–0.5)
HCT: 43.6 % (ref 34.8–46.6)
HGB: 16 g/dL — ABNORMAL HIGH (ref 11.6–15.9)
LYMPH#: 1.4 10*3/uL (ref 0.9–3.3)
LYMPH%: 19.2 % (ref 14.0–48.0)
MCH: 37.9 pg — ABNORMAL HIGH (ref 26.0–34.0)
MCHC: 36.7 g/dL — ABNORMAL HIGH (ref 32.0–36.0)
MCV: 103 fL — ABNORMAL HIGH (ref 81–101)
MONO#: 0.7 10*3/uL (ref 0.1–0.9)
MONO%: 10.4 % (ref 0.0–13.0)
NEUT#: 4.8 10*3/uL (ref 1.5–6.5)
NEUT%: 68.7 % (ref 39.6–80.0)
Platelets: 159 10*3/uL (ref 145–400)
RBC: 4.22 10*6/uL (ref 3.70–5.32)
RDW: 11.6 % (ref 11.1–15.7)
WBC: 7 10*3/uL (ref 3.9–10.0)

## 2012-09-21 LAB — FERRITIN CHCC: Ferritin: 46 ng/ml (ref 9–269)

## 2012-09-21 MED ORDER — MEPERIDINE HCL 25 MG/ML IJ SOLN: 25.0000 mg | Freq: Once | INTRAMUSCULAR | Status: DC

## 2012-09-21 MED ORDER — MIDAZOLAM HCL 10 MG/2ML IJ SOLN: 5.0000 mg | Freq: Once | INTRAMUSCULAR | Status: DC

## 2012-09-21 NOTE — Progress Notes (Signed)
This office note has been dictated.

## 2012-09-21 NOTE — Progress Notes (Signed)
[name] presents today for phlebotomy per MD orders. Phlebotomy procedure started at 1245 and ended at 1255. 500 grams removed. Patient observed for 30 minutes after procedure without any incident. Patient tolerated procedure well. IV needle removed intact.

## 2012-09-21 NOTE — Patient Instructions (Addendum)

## 2012-09-21 NOTE — Addendum Note (Signed)
Addended by: Josph Macho on: 09/21/2012 02:38 PM   Modules accepted: Orders, SmartSet

## 2012-09-22 NOTE — Progress Notes (Signed)
CC:   Robert P. Merla Riches, M.D.  DIAGNOSIS: 1. 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  CURRENT THERAPY: 1. Phlebotomy to maintain hematocrit below 45%. 2. Aspirin 81 mg p.o. daily.  INTERIM HISTORY:  [name] comes in for followup.  She still feels tired.  She and her husband got back from [place].  He received proton therapy treatment for his prostate cancer down there.  It went very, very well for him.  When we last saw her in June, her ferritin was 246.  She was actually, I think, seen down at the [facility] down there. They thought that maybe a bone marrow test would help.  I certainly have no problems with this.  I thought about doing this with [name].  We will go ahead and get this set up for September 9.  I still do not see any relationship between her blood issue and her mitochondrial disorder.  She is wondering if she could go to a special clinic up at [facility] to see if she could be evaluated.  I told her this was no problem from my point of view.  I think her family doctor would have to make this kind of referral however.  Again, she does feel tired today.  She has had no fever.  There has been no cough.  She has had no change in bowel or bladder habits.  There has been no rashes.  PHYSICAL EXAMINATION:  General:  This is a petite white female in no obvious distress.  Vital signs:  Temperature of 97.8, pulse 79, respiratory 14, blood pressure 121/65.  Weight is 129.  Head and neck: Normocephalic, atraumatic skull.  There are no ocular or oral lesions. There are no palpable cervical or supraclavicular lymph nodes.  Lungs: Clear bilaterally.  Cardiac:  Regular rate and rhythm with a normal S1 and S2.  There are no murmurs, rubs or bruits.  Abdomen:  Soft.  She has good bowel sounds.  There is no fluid wave.  There is no palpable hepatosplenomegaly.  Extremities:  Show no clubbing, cyanosis or edema. She has some decreased muscle strength  bilaterally.  This is chronic. Skin:  No rashes, ecchymosis, or petechia.  LABORATORY STUDIES:  White cell count is 7, hemoglobin 16, hematocrit 43.6, platelet count 159.  MCV is 103.  IMPRESSION:  [name] is a very nice 67 year old white female with polycythemia.  Again, she is JAK2 negative, but has a very low erythropoietin level.  I thought this was very significant for her.  We will go ahead and get a bone marrow test on her.  I cannot imagine the bone marrow showing [place] anything unusual, but yet I think it could help "complete the picture."  We will get this set up for 09/09.  I will have her come back to see me about 2 weeks later.    ______________________________ [name], M.D. PRE/MEDQ  D:  09/21/2012  T:  09/22/2012  Job:  [id number]

## 2012-09-29 ENCOUNTER — Ambulatory Visit: Payer: Medicare Other | Admitting: Hematology & Oncology

## 2012-09-29 ENCOUNTER — Other Ambulatory Visit: Payer: Medicare Other | Admitting: Lab

## 2012-10-03 ENCOUNTER — Encounter (HOSPITAL_COMMUNITY): Payer: Self-pay | Admitting: Pharmacy Technician

## 2012-10-04 ENCOUNTER — Ambulatory Visit (HOSPITAL_COMMUNITY)
Admission: RE | Admit: 2012-10-04 | Discharge: 2012-10-04 | Disposition: A | Discharge status: Home / Self Care | Payer: Medicare Other | Source: Ambulatory Visit | Attending: Hematology & Oncology | Admitting: Hematology & Oncology

## 2012-10-04 ENCOUNTER — Other Ambulatory Visit (HOSPITAL_COMMUNITY): Payer: Self-pay | Admitting: Hematology & Oncology

## 2012-10-04 ENCOUNTER — Ambulatory Visit (HOSPITAL_BASED_OUTPATIENT_CLINIC_OR_DEPARTMENT_OTHER): Payer: Medicare Other | Admitting: Hematology & Oncology

## 2012-10-04 VITALS — BP 99/64 | HR 72 | Temp 97.8°F | Resp 17 | Ht 62.0 in | Wt 130.0 lb

## 2012-10-04 DIAGNOSIS — D45 Polycythemia vera: Secondary | ICD-10-CM

## 2012-10-04 DIAGNOSIS — D751 Secondary polycythemia: Secondary | ICD-10-CM | POA: Insufficient documentation

## 2012-10-04 DIAGNOSIS — D7589 Other specified diseases of blood and blood-forming organs: Secondary | ICD-10-CM | POA: Insufficient documentation

## 2012-10-04 LAB — CBC WITH DIFFERENTIAL/PLATELET
Basophils Absolute: 0 10*3/uL (ref 0.0–0.1)
Basophils Relative: 1 % (ref 0–1)
Eosinophils Absolute: 0.1 10*3/uL (ref 0.0–0.7)
Eosinophils Relative: 2 % (ref 0–5)
HCT: 44.1 % (ref 36.0–46.0)
Hemoglobin: 15.6 g/dL — ABNORMAL HIGH (ref 12.0–15.0)
Lymphocytes Relative: 23 % (ref 12–46)
Lymphs Abs: 1.3 10*3/uL (ref 0.7–4.0)
MCH: 36.3 pg — ABNORMAL HIGH (ref 26.0–34.0)
MCHC: 35.4 g/dL (ref 30.0–36.0)
MCV: 102.6 fL — ABNORMAL HIGH (ref 78.0–100.0)
Monocytes Absolute: 0.6 10*3/uL (ref 0.1–1.0)
Monocytes Relative: 10 % (ref 3–12)
Neutro Abs: 3.7 10*3/uL (ref 1.7–7.7)
Neutrophils Relative %: 65 % (ref 43–77)
Platelets: 170 10*3/uL (ref 150–400)
RBC: 4.3 MIL/uL (ref 3.87–5.11)
RDW: 12.2 % (ref 11.5–15.5)
WBC: 5.8 10*3/uL (ref 4.0–10.5)

## 2012-10-04 LAB — BONE MARROW EXAM

## 2012-10-04 MED ORDER — MEPERIDINE HCL 50 MG/ML IJ SOLN
25.0000 mg | Freq: Once | INTRAMUSCULAR | Status: DC
  Filled 2012-10-04: qty 1

## 2012-10-04 MED ORDER — MIDAZOLAM HCL 10 MG/2ML IJ SOLN
5.0000 mg | Freq: Once | INTRAMUSCULAR | Status: DC
  Filled 2012-10-04: qty 2

## 2012-10-04 MED ORDER — MIDAZOLAM HCL 5 MG/5ML IJ SOLN
INTRAMUSCULAR | Status: AC | PRN
  Administered 2012-10-04 (×4): 1 mg via INTRAVENOUS
  Administered 2012-10-04: 1.5 mg via INTRAVENOUS

## 2012-10-04 MED ORDER — SODIUM CHLORIDE 0.9 % IV SOLN
Freq: Once | INTRAVENOUS | Status: AC
  Administered 2012-10-04: 08:00:00 via INTRAVENOUS

## 2012-10-04 MED ORDER — MEPERIDINE HCL 25 MG/ML IJ SOLN
INTRAMUSCULAR | Status: AC | PRN
  Administered 2012-10-04 (×2): 12.5 mg via INTRAVENOUS

## 2012-10-04 NOTE — Sedation Documentation (Signed)
Medication dose calculated and verified for: VERSED 5 mg IV, Demerol 25 mg IV

## 2012-10-04 NOTE — Progress Notes (Signed)
This office note has been dictated.

## 2012-10-04 NOTE — ED Notes (Signed)
Dressing to left post iliac area CDI 

## 2012-10-04 NOTE — ED Notes (Signed)
Ambulated in room with minimal assist and tolerated this well. Dressing remains CDI

## 2012-10-04 NOTE — ED Notes (Signed)
Patient denies pain and is resting comfortably.  

## 2012-10-04 NOTE — ED Notes (Signed)
Family updated as to patient's status.

## 2012-10-04 NOTE — ED Notes (Signed)
Procedure ends .Dressing to left post iliac area with hypafix and gauze. Pt placed supine with towel to site for pressure

## 2012-10-05 NOTE — Procedures (Signed)
[name] was brought to the short stay unit at [facility]. She had an IV placed peripherally without any difficulties.  She was having a bone marrow biopsy done because of erythrocytosis and the possibility of polycythemia.  Her Mallampati score was 1.  Her capillary class was 1.  A time-out procedure was performed appropriately.  We then placed onto her right side.  She got a total of 5 mg of Versed and 25 mg of Demerol for IV sedation.  The left posterior iliac crest region was prepped and draped in sterile fashion.  Eight cc of 2% lidocaine was infiltrated under the skin down to the periosteum.  A #11 scalpel was used to make an incision into the skin.  We obtained 2 bone marrow aspirates without difficulty.  We then used a Jamshidi biopsy needle.  We got a good bone marrow biopsy core.  The procedure site was cleaned and dressed appropriately and sterilely.  The patient tolerated the procedure well.  There were no complications.    ______________________________ Josph Macho, M.D. PRE/MEDQ  D:  10/04/2012  T:  10/05/2012  Job:  [id number]

## 2012-10-10 ENCOUNTER — Encounter: Payer: Self-pay | Admitting: Hematology & Oncology

## 2012-10-11 LAB — CHROMOSOME ANALYSIS, BONE MARROW

## 2012-10-11 LAB — TISSUE HYBRIDIZATION (BONE MARROW)-NCBH

## 2012-10-19 ENCOUNTER — Ambulatory Visit (HOSPITAL_BASED_OUTPATIENT_CLINIC_OR_DEPARTMENT_OTHER): Payer: Medicare Other | Admitting: Hematology & Oncology

## 2012-10-19 ENCOUNTER — Other Ambulatory Visit (HOSPITAL_BASED_OUTPATIENT_CLINIC_OR_DEPARTMENT_OTHER): Payer: Medicare Other | Admitting: Lab

## 2012-10-19 VITALS — BP 114/66 | HR 96 | Temp 98.2°F | Resp 14 | Ht 62.0 in | Wt 130.0 lb

## 2012-10-19 DIAGNOSIS — G250 Essential tremor: Secondary | ICD-10-CM

## 2012-10-19 DIAGNOSIS — D45 Polycythemia vera: Secondary | ICD-10-CM

## 2012-10-19 DIAGNOSIS — D510 Vitamin B12 deficiency anemia due to intrinsic factor deficiency: Secondary | ICD-10-CM

## 2012-10-19 LAB — CBC WITH DIFFERENTIAL (CANCER CENTER ONLY)
BASO#: 0 10*3/uL (ref 0.0–0.2)
BASO%: 0.4 % (ref 0.0–2.0)
EOS%: 1.5 % (ref 0.0–7.0)
Eosinophils Absolute: 0.1 10*3/uL (ref 0.0–0.5)
HCT: 47.7 % — ABNORMAL HIGH (ref 34.8–46.6)
HGB: 16.4 g/dL — ABNORMAL HIGH (ref 11.6–15.9)
LYMPH#: 1.3 10*3/uL (ref 0.9–3.3)
LYMPH%: 16.6 % (ref 14.0–48.0)
MCH: 35.9 pg — ABNORMAL HIGH (ref 26.0–34.0)
MCHC: 34.4 g/dL (ref 32.0–36.0)
MCV: 104 fL — ABNORMAL HIGH (ref 81–101)
MONO#: 0.7 10*3/uL (ref 0.1–0.9)
MONO%: 9.2 % (ref 0.0–13.0)
NEUT#: 5.4 10*3/uL (ref 1.5–6.5)
NEUT%: 72.3 % (ref 39.6–80.0)
Platelets: 148 10*3/uL (ref 145–400)
RBC: 4.57 10*6/uL (ref 3.70–5.32)
RDW: 12 % (ref 11.1–15.7)
WBC: 7.5 10*3/uL (ref 3.9–10.0)

## 2012-10-19 LAB — RETICULOCYTES (CHCC)
ABS Retic: 78.4 10*3/uL (ref 19.0–186.0)
RBC.: 4.61 MIL/uL (ref 3.87–5.11)
Retic Ct Pct: 1.7 % (ref 0.4–2.3)

## 2012-10-19 NOTE — Progress Notes (Signed)
This office note has been dictated.

## 2012-10-20 ENCOUNTER — Ambulatory Visit (HOSPITAL_BASED_OUTPATIENT_CLINIC_OR_DEPARTMENT_OTHER): Payer: Medicare Other

## 2012-10-20 VITALS — BP 110/70 | HR 90 | Temp 98.7°F | Resp 20

## 2012-10-20 DIAGNOSIS — D45 Polycythemia vera: Secondary | ICD-10-CM

## 2012-10-20 LAB — IRON AND TIBC CHCC
%SAT: 41 % (ref 21–57)
Iron: 129 ug/dL (ref 41–142)
TIBC: 315 ug/dL (ref 236–444)
UIBC: 186 ug/dL (ref 120–384)

## 2012-10-20 LAB — FERRITIN CHCC: Ferritin: 75 ng/ml (ref 9–269)

## 2012-10-20 NOTE — Progress Notes (Signed)
Larwance Sachs presents today for phlebotomy per MD orders. Phlebotomy procedure started at 1440 and ended at 1450. 500 mls removed. Patient observed for 30 minutes after procedure without any incident. Patient tolerated procedure well. IV needle removed intact.

## 2012-10-20 NOTE — Patient Instructions (Signed)

## 2012-10-25 NOTE — Progress Notes (Signed)
CC:   Robert P. Merla Riches, M.D.  DIAGNOSIS: 1. 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  CURRENT THERAPY: 1. Phlebotomy to maintain hematocrit below 45%. 2. Aspirin 81 mg p.o. q. day.  INTERIM HISTORY:  [name] comes in for followup.  We did go ahead and do a bone marrow biopsy on her.  This was done on September 9th.  The biopsy report ([id number]) shows a hypercellular marrow.  There is trilineage hematopoiesis.  There are no obvious myeloproliferative changes.  There are some megaloblastoid changes.  Iron stores were markedly decreased.  Cytogenetics were negative.  She does feel tired.  It is hard to say whether this is from iron deficiency from polycythemia or if this is from mitochondrial myopathy.  We certainly are getting her iron stores down.  Her last iron stores back in August showed an iron saturation of 44% with a ferritin of 46.  She has had no burning in the hands or feet.  She has had no headache. There have been no problems with her appetite.  She has had no leg swelling.  Overall, her performance status is ECOG 0.  PHYSICAL EXAMINATION:  General:  This is a petite white female in no obvious distress.  Vital Signs:  Show a temperature of 98.2, pulse 96, respiratory rate 14, blood pressure 114/66.  Weight is 130 pounds.  Head and Neck:  Shows a normocephalic, atraumatic skull.  There are no ocular or oral lesions.  There are no palpable cervical or supraclavicular lymph nodes.  Lungs:  Clear bilaterally.  She has no rales, wheezes, or rhonchi.  Cardiac:  Regular rate and rhythm with a normal S1 and S2. There are no murmurs, rubs, or bruits.  Abdomen:  Soft.  She has good bowel sounds.  There is no palpable abdominal mass.  There is no fluid wave.  There is no palpable hepatosplenomegaly.  Extremities:  Show no clubbing, cyanosis, or edema.  Neurological:  Shows no focal neurological deficits.  Skin:  Shows some slight facial plethora.   Her skin may have a slight ruddy complexion.  LABORATORY STUDIES:  White cell count is 7.5, hemoglobin 16.4, hematocrit 47.7, platelet count 148.  MCV is 104.  IMPRESSION:  [name] is a very nice 67 year old white female with polycythemia.  Even though she is JAK2 negative, I have to believe that she has polycythemia.  Her erythropoietin level is only 5.  She has no iron stores in her bone marrow; yet, she is able to __________ an erythrocytosis.  We will go ahead and phlebotomize her.  I still want to keep her hematocrit below 45%.  Again, I am not sure why she feels so fatigued.  I do not know if this is from iron deficiency.  I do not know if this is from her mitochondrial myopathy.  I will plan to get her back to see [place] in another 3 weeks.  She will be phlebotomized tomorrow.    ______________________________ [name], M.D. PRE/MEDQ  D:  10/19/2012  T:  10/25/2012  Job:  [id number]

## 2012-10-26 ENCOUNTER — Ambulatory Visit (INDEPENDENT_AMBULATORY_CARE_PROVIDER_SITE_OTHER): Payer: Medicare Other | Admitting: Internal Medicine

## 2012-10-26 VITALS — BP 113/67 | HR 92 | Temp 98.2°F | Resp 16 | Ht 62.5 in | Wt 130.0 lb

## 2012-10-26 DIAGNOSIS — J45909 Unspecified asthma, uncomplicated: Secondary | ICD-10-CM

## 2012-10-26 DIAGNOSIS — R0602 Shortness of breath: Secondary | ICD-10-CM

## 2012-10-26 DIAGNOSIS — F411 Generalized anxiety disorder: Secondary | ICD-10-CM

## 2012-10-26 DIAGNOSIS — G729 Myopathy, unspecified: Secondary | ICD-10-CM

## 2012-10-26 DIAGNOSIS — D45 Polycythemia vera: Secondary | ICD-10-CM

## 2012-10-26 DIAGNOSIS — G4700 Insomnia, unspecified: Secondary | ICD-10-CM

## 2012-10-26 MED ORDER — LORAZEPAM 0.5 MG PO TABS: 0.5000 mg | ORAL_TABLET | Freq: Two times a day (BID) | ORAL | Status: DC | PRN

## 2012-10-26 NOTE — Progress Notes (Deleted)
  Subjective:    Patient ID: [name], female    DOB: October 10, 1945, 67 y.o.   MRN: [id number]  HPI    Review of Systems     Objective:   Physical Exam        Assessment & Plan:

## 2012-10-26 NOTE — Progress Notes (Deleted)
  Subjective:    Patient ID: [name], female    DOB: 03/18/1945, 67 y.o.   MRN: [id number]  HPI    Review of Systems     Objective:   Physical Exam        Assessment & Plan:   

## 2012-10-26 NOTE — Progress Notes (Signed)
  Subjective:    Patient ID: [name], female    DOB: 1945-08-29, 67 y.o.   MRN: [id number]  HPI "I am fatigued." Unable to do much of anything. Very difficult to do household chores, anything for pleasure like eating out or shopping.   Never heard from Dr. Richardean Chimera regarding appointment for myopathy, to r/o other neuro considerations  Not sure about using albuterol, "i don't have asthma, I have no air." No wheeze, no chest tightness. Never at rest or at night. Had a breathing test with Dr. Darrick Penna one time. Result low. Has some improvement with albuterol but doesn't like the way albuterol makes her heart race and face flush. Thinks "no air" related to muscles  Is interested in being referred to [facility] to see Dr. Jarome Lamas, a genetic specialist.  Has been to [facility] before and had muscle bx, with inconclusive results. Dx with mitochondrial miosis.  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 After about 4 days post procedure she feel a little better. She is not convinced she has PCV because JAX test negative. Had BM biopsy which was negative, she still doesn't know why she is making too much blood.  Her parents may have been first cousins.Her mother was very sickly and received a great deal of cortisone at [facility].  Taking milk thistle and vitamin D, this was ok'd with Dr. Myna Hidalgo.  Takes ativan at night for sleep, occasionally for daytime anxiety.  Review of Systems  Constitutional: Positive for fatigue. Negative for fever, activity change, appetite change and unexpected weight change.  HENT: Negative for hearing loss and neck pain.   Eyes: Negative for visual disturbance.  Cardiovascular: Negative for chest pain, palpitations and leg swelling.  Gastrointestinal: Negative for abdominal pain.  Genitourinary: Negative for difficulty urinating.  Psychiatric/Behavioral: Negative for hallucinations, behavioral problems, dysphoric mood and agitation.        Objective:   Physical Exam  Constitutional: She is oriented to person, place, and time. She appears well-developed and well-nourished.  Cardiovascular: Normal rate, regular rhythm and normal heart sounds.   Pulmonary/Chest: Effort normal and breath sounds normal.  Neurological: She is alert and oriented to person, place, and time.  Skin: Skin is warm and dry.  Psychiatric: She has a normal mood and affect. Her behavior is normal. Judgment and thought content normal.      Assessment & Plan:  GAD (generalized anxiety disorder) - Plan: LORazepam (ATIVAN) 0.5 MG tablet  Shortness of breath  Meds ordered this encounter  Medications  . milk thistle 175 MG tablet    Sig: Take 175 mg by mouth daily.  . cholecalciferol (VITAMIN D) 1000 UNITS tablet    Sig: Take 1,000 Units by mouth daily.  Marland Kitchen LORazepam (ATIVAN) 0.5 MG tablet    Sig: Take 1 tablet (0.5 mg total) by mouth every 12 (twelve) hours as needed for anxiety.    Dispense:  60 tablet    Refill:  5   SOB- discussed Albuterol use and asthma diagnosis. Offered PFTs to rule in/out asthma. Patient not interested in this currently. If increased use of albuterol needed, then she will need to return for further evaluation/treatment.  Will hold off on local neuro eval for myopathy at this time. Will refer patient to Dr. Jarome Lamas at [facility].  Follow up in 6 months

## 2012-10-27 ENCOUNTER — Encounter: Payer: Self-pay | Admitting: Hematology & Oncology

## 2012-11-09 ENCOUNTER — Ambulatory Visit (HOSPITAL_BASED_OUTPATIENT_CLINIC_OR_DEPARTMENT_OTHER): Payer: Medicare Other | Admitting: Hematology & Oncology

## 2012-11-09 ENCOUNTER — Ambulatory Visit (HOSPITAL_BASED_OUTPATIENT_CLINIC_OR_DEPARTMENT_OTHER): Payer: Medicare Other

## 2012-11-09 ENCOUNTER — Other Ambulatory Visit (HOSPITAL_BASED_OUTPATIENT_CLINIC_OR_DEPARTMENT_OTHER): Payer: Medicare Other | Admitting: Lab

## 2012-11-09 VITALS — BP 121/62 | HR 98 | Temp 98.0°F | Resp 14 | Ht 60.0 in | Wt 131.0 lb

## 2012-11-09 VITALS — BP 95/63 | HR 96

## 2012-11-09 DIAGNOSIS — D45 Polycythemia vera: Secondary | ICD-10-CM

## 2012-11-09 DIAGNOSIS — G250 Essential tremor: Secondary | ICD-10-CM

## 2012-11-09 DIAGNOSIS — D510 Vitamin B12 deficiency anemia due to intrinsic factor deficiency: Secondary | ICD-10-CM

## 2012-11-09 LAB — CBC WITH DIFFERENTIAL (CANCER CENTER ONLY)
BASO#: 0 10*3/uL (ref 0.0–0.2)
BASO%: 0.5 % (ref 0.0–2.0)
EOS%: 1.8 % (ref 0.0–7.0)
Eosinophils Absolute: 0.1 10*3/uL (ref 0.0–0.5)
HCT: 43.9 % (ref 34.8–46.6)
HGB: 15.3 g/dL (ref 11.6–15.9)
LYMPH#: 1.5 10*3/uL (ref 0.9–3.3)
LYMPH%: 24.8 % (ref 14.0–48.0)
MCH: 35.2 pg — ABNORMAL HIGH (ref 26.0–34.0)
MCHC: 34.9 g/dL (ref 32.0–36.0)
MCV: 101 fL (ref 81–101)
MONO#: 0.5 10*3/uL (ref 0.1–0.9)
MONO%: 8.3 % (ref 0.0–13.0)
NEUT#: 3.9 10*3/uL (ref 1.5–6.5)
NEUT%: 64.6 % (ref 39.6–80.0)
Platelets: 164 10*3/uL (ref 145–400)
RBC: 4.35 10*6/uL (ref 3.70–5.32)
RDW: 11.6 % (ref 11.1–15.7)
WBC: 6 10*3/uL (ref 3.9–10.0)

## 2012-11-09 LAB — IRON AND TIBC CHCC
%SAT: 20 % — ABNORMAL LOW (ref 21–57)
Iron: 76 ug/dL (ref 41–142)
TIBC: 371 ug/dL (ref 236–444)
UIBC: 296 ug/dL (ref 120–384)

## 2012-11-09 LAB — TSH CHCC: TSH: 1.533 m(IU)/L (ref 0.308–3.960)

## 2012-11-09 LAB — VITAMIN B12: Vitamin B-12: 456 pg/mL (ref 211–911)

## 2012-11-09 LAB — FERRITIN CHCC: Ferritin: 29 ng/ml (ref 9–269)

## 2012-11-09 MED ORDER — LIDOCAINE-PRILOCAINE 2.5-2.5 % EX CREA
TOPICAL_CREAM | CUTANEOUS | Status: AC
  Filled 2012-11-09: qty 30

## 2012-11-09 NOTE — Patient Instructions (Signed)
Therapeutic Phlebotomy Therapeutic phlebotomy is the controlled removal of blood from your body for the purpose of treating a medical condition. It is similar to donating blood. Usually, about a pint (470 mL) of blood is removed. The average adult has 9 to 12 pints (4.3 to 5.7 L) of blood. Therapeutic phlebotomy may be used to treat the following medical conditions:  Hemochromatosis. This is a condition in which there is too much iron in the blood.  Polycythemia vera. This is a condition in which there are too many red cells in the blood.  Porphyria cutanea tarda. This is a disease usually passed from one generation to the next (inherited). It is a condition in which an important part of hemoglobin is not made properly. This results in the build up of abnormal amounts of porphyrins in the body.  Sickle cell disease. This is an inherited disease. It is a condition in which the red blood cells form an abnormal crescent shape rather than a round shape. LET YOUR CAREGIVER KNOW ABOUT:  Allergies.  Medicines taken including herbs, eyedrops, over-the-counter medicines, and creams.  Use of steroids (by mouth or creams).  Previous problems with anesthetics or numbing medicine.  History of blood clots.  History of bleeding or blood problems.  Previous surgery.  Possibility of pregnancy, if this applies. RISKS AND COMPLICATIONS This is a simple and safe procedure. Problems are unlikely. However, problems can occur and may include:  Nausea or lightheadedness.  Low blood pressure.  Soreness, bleeding, swelling, or bruising at the needle insertion site.  Infection. BEFORE THE PROCEDURE  This is a procedure that can be done as an outpatient. Confirm the time that you need to arrive for your procedure. Confirm whether there is a need to fast or withhold any medications. It is helpful to wear clothing with sleeves that can be raised above the elbow. A blood sample may be done to determine the  amount of red blood cells or iron in your blood. Plan ahead of time to have someone drive you home after the procedure. PROCEDURE The entire procedure from preparation through recovery takes about 1 hour. The actual collection takes about 10 to 15 minutes.  A needle will be inserted into your vein.  Tubing and a collection bag will be attached to that needle.  Blood will flow through the needle and tubing into the collection bag.  You may be asked to open and close your hand slowly and continuously during the entire collection.  Once the specified amount of blood has been removed from your body, the collection bag and tubing will be clamped.  The needle will be removed.  Pressure will be held on the site of the needle insertion to stop the bleeding. Then a bandage will be placed over the needle insertion site. AFTER THE PROCEDURE  Your recovery will be assessed and monitored. If there are no problems, as an outpatient, you should be able to go home shortly after the procedure.  Document Released: 06/16/2010 Document Revised: 04/06/2011 Document Reviewed: 06/16/2010 Exit[employer] Patient Information 2014 [employer].  

## 2012-11-09 NOTE — Progress Notes (Signed)
This office note has been dictated.

## 2012-11-09 NOTE — Progress Notes (Signed)
[name] presents today for phlebotomy per MD orders. Phlebotomy procedure started at 1145 and ended at 1155. 500 grams removed. Patient observed for 30 minutes after procedure without any incident. Patient tolerated procedure well. IV needle removed intact.

## 2012-11-10 NOTE — Progress Notes (Signed)
CC:   Robert P. Merla Riches, M.D.  DIAGNOSES: 1. 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 2. Mitochondrial myopathy.  CURRENT THERAPY: 1. Phlebotomy to maintain hematocrit below 45%. 2. Aspirin 81 mg p.o. daily.  INTERIM HISTORY:  [name] comes in for followup.  She is doing pretty well.  She does feel tired right now.  She says that after each phlebotomy, she begins to feel better.  When we last saw her, her ferritin was 75 and iron saturation was 41%. We are gradually getting the iron out of her, which hopefully will help with respect to the polycythemia.  She has had no "flare-ups" of the mitochondrial myopathy.  She has had no headache.  There is no cough.  There is no change in bowel or bladder habits.  Her appetite has been fairly good.  PHYSICAL EXAMINATION:  General:  This is a petite white female, in no obvious distress.  Vital signs:  Temperature of 97.7, pulse 98, respiratory rate 14, blood pressure 121/62.  Weight is 131 pounds.  Head and neck:  Normocephalic, atraumatic skull.  There are no ocular or oral lesions.  She has no scleral icterus.  There is no facial plethora. Lungs:  Clear bilaterally.  Cardiac:  Regular rate and rhythm, with a normal S1 and S2.  There are no murmurs, rubs, or bruits.  Abdomen: Soft.  She has good bowel sounds.  There is no fluid wave.  There is no palpable hepatosplenomegaly.  Extremities:  No clubbing, cyanosis, or edema.  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  She has good strength in her extremities.  Skin:  No rashes, ecchymoses, or petechia.  LABORATORY STUDIES:  White cell count 6, hemoglobin 15.3, hematocrit 43.9, platelet count 164,000.  MCV is 101.  IMPRESSION:  [name] is a very nice, 67 year old white female with polycythemia.  We did do a bone marrow test on her.  She does have a hypercellular marrow.  She has a very low erythropoietin level.  We will go ahead and phlebotomize her today.  I suppose  that it might be __________ we may have to keep her hematocrit lower than 45.  I wonder if 42 may not be a reasonable level for her.  We will have to see how she does with each phlebotomy.  Again, we are getting the iron out of her system, and hopefully this will limit how quickly her bone marrow makes her red cells.  Will plan to get her back to see [place] in another month.  I do not see anything else that we need to do with [name] right now.    ______________________________ Josph Macho, M.D. PRE/MEDQ  D:  11/08/2012  T:  11/10/2012  Job:  [id number]

## 2012-11-16 ENCOUNTER — Telehealth: Payer: Self-pay

## 2012-11-16 NOTE — Telephone Encounter (Signed)
Checking on status of referral to [facility] that Dr Merla Riches was checking on  [phone]

## 2012-12-06 ENCOUNTER — Ambulatory Visit: Payer: Medicare Other | Admitting: Hematology & Oncology

## 2012-12-06 ENCOUNTER — Other Ambulatory Visit: Payer: Medicare Other | Admitting: Lab

## 2012-12-09 ENCOUNTER — Ambulatory Visit (HOSPITAL_BASED_OUTPATIENT_CLINIC_OR_DEPARTMENT_OTHER): Payer: Medicare Other | Admitting: Hematology & Oncology

## 2012-12-09 ENCOUNTER — Other Ambulatory Visit (HOSPITAL_BASED_OUTPATIENT_CLINIC_OR_DEPARTMENT_OTHER): Payer: Medicare Other | Admitting: Lab

## 2012-12-09 ENCOUNTER — Ambulatory Visit (HOSPITAL_BASED_OUTPATIENT_CLINIC_OR_DEPARTMENT_OTHER): Payer: Medicare Other

## 2012-12-09 VITALS — BP 140/60 | HR 95 | Temp 97.4°F | Resp 14 | Ht 60.0 in | Wt 131.0 lb

## 2012-12-09 DIAGNOSIS — D45 Polycythemia vera: Secondary | ICD-10-CM

## 2012-12-09 DIAGNOSIS — R7989 Other specified abnormal findings of blood chemistry: Secondary | ICD-10-CM

## 2012-12-09 LAB — CBC WITH DIFFERENTIAL (CANCER CENTER ONLY)
BASO#: 0 10*3/uL (ref 0.0–0.2)
BASO%: 0.5 % (ref 0.0–2.0)
EOS%: 1.8 % (ref 0.0–7.0)
Eosinophils Absolute: 0.1 10*3/uL (ref 0.0–0.5)
HCT: 45.1 % (ref 34.8–46.6)
HGB: 15.1 g/dL (ref 11.6–15.9)
LYMPH#: 1.1 10*3/uL (ref 0.9–3.3)
LYMPH%: 16.6 % (ref 14.0–48.0)
MCH: 34 pg (ref 26.0–34.0)
MCHC: 33.5 g/dL (ref 32.0–36.0)
MCV: 102 fL — ABNORMAL HIGH (ref 81–101)
MONO#: 0.7 10*3/uL (ref 0.1–0.9)
MONO%: 10.7 % (ref 0.0–13.0)
NEUT#: 4.6 10*3/uL (ref 1.5–6.5)
NEUT%: 70.4 % (ref 39.6–80.0)
Platelets: 163 10*3/uL (ref 145–400)
RBC: 4.44 10*6/uL (ref 3.70–5.32)
RDW: 12 % (ref 11.1–15.7)
WBC: 6.6 10*3/uL (ref 3.9–10.0)

## 2012-12-09 LAB — FERRITIN CHCC: Ferritin: 35 ng/ml (ref 9–269)

## 2012-12-09 LAB — IRON AND TIBC CHCC
%SAT: 40 % (ref 21–57)
Iron: 150 ug/dL — ABNORMAL HIGH (ref 41–142)
TIBC: 376 ug/dL (ref 236–444)
UIBC: 227 ug/dL (ref 120–384)

## 2012-12-09 NOTE — Progress Notes (Signed)
This office note has been dictated.

## 2012-12-09 NOTE — Patient Instructions (Signed)
Therapeutic Phlebotomy Therapeutic phlebotomy is the controlled removal of blood from your body for the purpose of treating a medical condition. It is similar to donating blood. Usually, about a pint (470 mL) of blood is removed. The average adult has 9 to 12 pints (4.3 to 5.7 L) of blood. Therapeutic phlebotomy may be used to treat the following medical conditions:  Hemochromatosis. This is a condition in which there is too much iron in the blood.  Polycythemia vera. This is a condition in which there are too many red cells in the blood.  Porphyria cutanea tarda. This is a disease usually passed from one generation to the next (inherited). It is a condition in which an important part of hemoglobin is not made properly. This results in the build up of abnormal amounts of porphyrins in the body.  Sickle cell disease. This is an inherited disease. It is a condition in which the red blood cells form an abnormal crescent shape rather than a round shape. LET YOUR CAREGIVER KNOW ABOUT:  Allergies.  Medicines taken including herbs, eyedrops, over-the-counter medicines, and creams.  Use of steroids (by mouth or creams).  Previous problems with anesthetics or numbing medicine.  History of blood clots.  History of bleeding or blood problems.  Previous surgery.  Possibility of pregnancy, if this applies. RISKS AND COMPLICATIONS This is a simple and safe procedure. Problems are unlikely. However, problems can occur and may include:  Nausea or lightheadedness.  Low blood pressure.  Soreness, bleeding, swelling, or bruising at the needle insertion site.  Infection. BEFORE THE PROCEDURE  This is a procedure that can be done as an outpatient. Confirm the time that you need to arrive for your procedure. Confirm whether there is a need to fast or withhold any medications. It is helpful to wear clothing with sleeves that can be raised above the elbow. A blood sample may be done to determine the  amount of red blood cells or iron in your blood. Plan ahead of time to have someone drive you home after the procedure. PROCEDURE The entire procedure from preparation through recovery takes about 1 hour. The actual collection takes about 10 to 15 minutes.  A needle will be inserted into your vein.  Tubing and a collection bag will be attached to that needle.  Blood will flow through the needle and tubing into the collection bag.  You may be asked to open and close your hand slowly and continuously during the entire collection.  Once the specified amount of blood has been removed from your body, the collection bag and tubing will be clamped.  The needle will be removed.  Pressure will be held on the site of the needle insertion to stop the bleeding. Then a bandage will be placed over the needle insertion site. AFTER THE PROCEDURE  Your recovery will be assessed and monitored. If there are no problems, as an outpatient, you should be able to go home shortly after the procedure.  Document Released: 06/16/2010 Document Revised: 04/06/2011 Document Reviewed: 06/16/2010 Exit[employer] Patient Information 2014 [employer].  

## 2012-12-12 ENCOUNTER — Telehealth: Payer: Self-pay | Admitting: Hematology & Oncology

## 2012-12-12 NOTE — Telephone Encounter (Signed)
Pt moved 12-22 to 12-17

## 2012-12-14 LAB — HEMOCHROMATOSIS DNA-PCR(C282Y,H63D)

## 2012-12-18 NOTE — Progress Notes (Signed)
CC:   Robert P. Merla Riches, M.D.  DIAGNOSES: 1. 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 2. Mitochondrial myopathy.  CURRENT THERAPY: 1. Phlebotomy to maintain hematocrit below 42%. 2. Aspirin 81 mg p.o. daily.  INTERIM HISTORY:  Ms. Bechtold comes in for followup.  She actually is doing fairly well.  We are doing nicely with the phlebotomies.  When we first saw her, her ferritin was 249 with an iron saturation of 50%.  We now have her down to a ferritin of 29 with an iron saturation of 20%.  She feels better with her hematocrit below 42%.  We did do a bone marrow biopsy on her.  This was done on September 9. This was thought to confirm polycythemia.  She did have a hypercellular marrow.  The marrow had markedly decreased iron stores.  Again, I felt that this was all consistent with polycythemia.  She has had no problems with headaches.  She has had no rashes.  There has been no change in bowel or bladder habits.  PHYSICAL EXAMINATION:  General:  This is a petite white female, in no obvious distress.  Vital signs:  Temperature of 97.4, pulse 95, respiratory rate 18, blood pressure 140/60, weight is 131 pounds.  Head and Neck:  Normocephalic, atraumatic skull.  There are no ocular or oral lesions.  There are no palpable cervical or supraclavicular lymph nodes. Lungs:  Clear to percussion and auscultation bilaterally.  Cardiac: Regular rate and rhythm with normal S1 and S2.  There are no murmurs, rubs, or bruits.  Abdomen:  Soft.  She has good bowel sounds.  There is no palpable abdominal mass.  There is no fluid wave.  There is no palpable hepatosplenomegaly.  Back:  Some slight kyphosis.  She has no tenderness over the spine, ribs, or hips.  Extremities:  Some osteoarthritic changes in her joints.  Neurologic:  No focal neurological deficits.  LABORATORY STUDIES:  White cell count of 6.6, hemoglobin 15.1, hematocrit 45.1, platelet count 163.  MCV is 102.  IMPRESSION:  Ms. Quirion is a  very charming 67 year old white female with polycythemia.  She is JAK2 negative.  However, the studies that we have gotten on her really have been fairly consistent with polycythemia. When we first saw her, her erythropoietin level was incredibly low despite her elevated hemoglobin.  Her bone marrow, having very little iron also I think would be consistent with polycythemia.  We will keep her hematocrit below 42%.  This really works well for her.  We will plan to have her come back monthly to have her CBC and possible phlebotomy.  I will see her back myself in about 2 or 3 months.    ______________________________ Josph Macho, M.D. PRE/MEDQ  D:  12/09/2012  T:  12/17/2012  Job:  [id number]

## 2012-12-20 ENCOUNTER — Telehealth: Payer: Self-pay

## 2012-12-20 DIAGNOSIS — G250 Essential tremor: Secondary | ICD-10-CM

## 2012-12-20 DIAGNOSIS — G729 Myopathy, unspecified: Secondary | ICD-10-CM

## 2012-12-20 NOTE — Telephone Encounter (Signed)
[facility] referral was put on hold because of [facility] referral--will retry

## 2012-12-20 NOTE — Telephone Encounter (Signed)
PT CAME BY 104 AND DROPPED OFF A NOTE FOR DR DOOLITTLE. SHE IS WAITING TO HEAR ABOUT A REFERRAL TO A LOCAL NEUROLOGIST AND ALSO A REFERRAL TO [facility].

## 2012-12-20 NOTE — Telephone Encounter (Signed)
Call-[facility] referral told us they would call you(months ago) and that it would take a long time to get in-I'll have the office call them again for an update Same with local-[facility] --we will contact them also

## 2012-12-21 NOTE — Telephone Encounter (Signed)
Note ref 12/01/12=Sent records to dr Ellamae Sia office at fax number [phone] and they will contact patient to schedule---this is [facility] Sent [facility] ref today

## 2012-12-21 NOTE — Telephone Encounter (Signed)
Called patient to reassure this is being worked on. Have sent message to Lupita Leash so she can check on this also.

## 2012-12-21 NOTE — Telephone Encounter (Signed)
Have we heard from [facility]? If not will you retry at [facility]?

## 2013-01-02 NOTE — Progress Notes (Signed)
CC:   [name], M.D.  DIAGNOSES: 1. 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 2. Mitochondrial myopathy.  CURRENT THERAPY: 1. Phlebotomy to maintain hematocrit below 45%. 2. Aspirin 81 mg p.o. daily.  INTERIM HISTORY:  [name] comes in for followup.  We did go ahead and do Faith bone marrow biopsy on [name].  Faith was done on September 9th.  The biopsy report ([id number]) shows Faith hypercellular marrow.  There is trilineage hematopoiesis.  There is no obvious myeloproliferative changes.  There is megaloblastoid changes.  Iron stores were markedly decreased.  Cytogenetics were negative.  [name] does feel tired.  It is hard to say whether Faith is from polycythemia or if Faith is from mitochondrial myopathy.  We certainly are getting [name] iron stores down.  [name] last iron stores back in August showed iron saturation of 44% with Faith ferritin of 46.  [name] has had no burning in the hands or feet.  [name] has had no headache. There has been no problems with [name] appetite.  [name] has had no leg swelling.  Overall, [name] performance status is ECOG 0.  PHYSICAL EXAMINATION:  General:  [name], in no obvious distress.  Vital Signs:  Temperature of 98.2, pulse 96, respiratory rate 14, blood pressure 114/66, weight is 130 pounds.  Head and Neck:  Shows Faith normocephalic, atraumatic skull.  There are no ocular or oral lesions.  There are no palpable cervical or supraclavicular lymph nodes.  Lungs:  Clear bilaterally.  [name] has no rales, wheezes, or rhonchi.  Cardiac:  Regular rate and rhythm with Faith normal S1 and S2. There are no murmurs, rubs or bruits.  Abdomen:  Soft.  [name] has good bowel sounds.  There is no palpable abdominal mass.  There is no fluid wave.  There is no palpable hepatosplenomegaly.  Extremities:  Shows no clubbing, cyanosis or edema.  Neurological:  Shows no focal neurological deficits.  Skin:  Shows some slight facial plethora.  Skin:  May have Faith slight ruddy  complexion.  LABORATORY STUDIES:  White cell count is 7.5, hemoglobin 16.4, hematocrit 47.7, platelet count 148.  MCV is 104.  IMPRESSION:  [name] is Faith very nice 67 year old white Hickman with polycythemia.  Even though [name] is JAK2 negative, I have to believe that [name] has polycythemia.  [name] erythropoietin level is only 5.  [name] has no iron stores in [name] bone marrow.  Yet, [name] is able to mount an erythrocytosis.  We will go ahead and phlebotomize [name].  I still want to keep [name] hematocrit below 45%.  Again, I am not sure why [name] feels so fatigued.  I do not know if Faith is from iron deficiency.  I do not know if Faith is from [name] mitochondrial myopathy.  I will plan to get [name] back to see [place] in another 3 weeks.  [name] will be phlebotomized tomorrow.    ______________________________ Josph Macho, M.D. PRE/MEDQ  D:  10/19/2012  T:  01/01/2013  Job:  [id number]

## 2013-01-10 ENCOUNTER — Ambulatory Visit (INDEPENDENT_AMBULATORY_CARE_PROVIDER_SITE_OTHER): Payer: Medicare Other | Admitting: Neurology

## 2013-01-10 ENCOUNTER — Encounter (INDEPENDENT_AMBULATORY_CARE_PROVIDER_SITE_OTHER): Payer: Self-pay

## 2013-01-10 ENCOUNTER — Encounter: Payer: Self-pay | Admitting: Neurology

## 2013-01-10 VITALS — BP 119/74 | HR 115 | Ht 63.0 in | Wt 133.0 lb

## 2013-01-10 DIAGNOSIS — G729 Myopathy, unspecified: Secondary | ICD-10-CM

## 2013-01-10 NOTE — Progress Notes (Signed)
[facility]    Provider:  Dr Hosie Poisson Referring Provider: Tonye Pearson, MD Primary Care Physician:  Tonye Pearson, MD  CC:  fatigue  HPI:  [name] is a 67 y.o. female here as a referral from Dr. Merla Riches for fatigue  She reports an outside diagnosis of myopathy of unclear etiology, has been told it is mitochondrial. She had the symptoms her whole life, feels it is getting progressively worse. Her parents were first cousins. She reports her mother had severe asthma but no  neurological problems in the family.  Her main concerns are severe fatigue, shortness of breath and lack of energy. She spends majority of her life in bed. Gets very tired doing simple activities around the house. Is unable to leave the house much. Denies any pain. Does work with a Psychologist, educational on a regular basis, reports she is never able to lift more than a couple pounds of weights. She does note some twitching spasms in her muscles. Denies any difficulty swallowing no choking on food. No weight loss. No visual changes no double vision. Feels out of breath all the time.  Has been extensively worked up at [facility] and [facility]. Diagnosed at [facility] via muscle biopsy with mitochondrial myopathy, she reports they are unable to give further information. Had biopsy for questionable glycogen storage disorder which was negative. Had EMG nerve conduction study done years ago, unsure what it showed. Was diagnosed with hemochromatosis. Does monthly phlebotomy. This has helped but continues to feel poor overall.   Review of Systems: Out of a complete 14 system review, the patient complains of only the following symptoms, and all other reviewed systems are negative. Positive fatigue chest pain palpitations shortness of breath snoring allergies runny nose snoring  History   Social History  . Marital Status: Married    Spouse Name: Richard    Number of Children: 2    . Years of Education: Grad   Occupational History  . Environmental manager    Social History Main Topics  . Smoking status: Never Smoker   . Smokeless tobacco: Never Used  . Alcohol Use: 1.2 oz/week    2 Glasses of wine per week     Comment: occasionally  . Drug Use: No  . Sexual Activity: Not on file   Other Topics Concern  . Not on file   Social History Narrative   Health Care POA:    Emergency Contact: husband, Truman Hayward, (c) [phone]   End of Life Plan:    Who lives with you: husband   Any pets: none   Diet: Pt has a varied diet.  Eats 5 sm. meals throughout day, focuses on protein, doesn't care for fruits and vegetables very much.   Exercise: Pt has a personal training and exercises several times a week.   Seatbelts: Pt reports wearing seatbelt when in vehicle.   [name] Exposure/Protection: Pt reports wearing sun protection.    Hobbies: reading, visiting with friends   Patient has a Event organiser.   Patient has two children.   Patient is retired.   Patient does not drink any caffeine.   Patient is right handed.             Family History  Problem Relation Age of Onset  . Asthma Mother   . Arthritis Mother   . Depression Mother   . Cancer Father   . Emphysema Father   . Stroke Father   . Alcohol  abuse Father   . Heart attack Father   . Colon cancer Neg Hx     Past Medical History  Diagnosis Date  . Pericarditis     age 62  . Pneumothorax   . Anxiety   . Iliotibial band syndrome     left knee  . Vitamin D deficiency   . Migraine   . Asthma   . Anemia   . IBS (irritable bowel syndrome)   . Hemorrhoids   . Cholelithiasis   . Mitochondrial myopathy   . Heart attack   . Osteoporosis   . Allergy   . Blood transfusion without reported diagnosis   . Polycythemia vera(238.4) 06/17/2012    Past Surgical History  Procedure Laterality Date  . Appendectomy    . Cesarean section      x 4  . Nasal sinus surgery    . Vein surgery    . Cardiac  catheterization      Current Outpatient Prescriptions  Medication Sig Dispense Refill  . albuterol (PROVENTIL HFA;VENTOLIN HFA) 108 (90 BASE) MCG/ACT inhaler Inhale 2 puffs into the lungs every 6 (six) hours as needed for wheezing or shortness of breath.       Marland Kitchen aspirin 81 MG tablet Take 81 mg by mouth daily.      Marland Kitchen estradiol (VIVELLE-DOT) 0.0375 MG/24HR Place 1 patch onto the skin 2 (two) times a week.      . hydrocortisone valerate cream (WESTCORT) 0.2 % Apply 1 application topically as needed.       Marland Kitchen LORazepam (ATIVAN) 0.5 MG tablet Take 1 tablet (0.5 mg total) by mouth every 12 (twelve) hours as needed for anxiety.  60 tablet  5  . NON FORMULARY Take by mouth 2 (two) times daily. Glyco Ccarn      . NON FORMULARY Take 100 mg by mouth every morning. Ubiquiniol      . NON FORMULARY Take by mouth 2 (two) times daily. D-Ribose      . progesterone (PROMETRIUM) 100 MG capsule Take 100 mg by mouth daily.        No current facility-administered medications for this visit.    Allergies as of 01/10/2013  . (No Known Allergies)    Vitals: BP 119/74  Pulse 115  Ht 5\' 3"  (1.6 m)  Wt 133 lb (60.328 kg)  BMI 23.57 kg/m2 Last Weight:  Wt Readings from Last 1 Encounters:  01/10/13 133 lb (60.328 kg)   Last Height:   Ht Readings from Last 1 Encounters:  01/10/13 5\' 3"  (1.6 m)     Physical exam: Exam: Gen: NAD, conversant Eyes: anicteric sclerae, moist conjunctivae HENT: Atraumatic, oropharynx clear Neck: Trachea midline; supple,  Lungs: CTA, no wheezing, rales, rhonic                          CV: RRR, no MRG Abdomen: Soft, non-tender;  Extremities: No peripheral edema  Skin: Normal temperature, no rash,  Psych: Appropriate affect, pleasant  Neuro: MS: AA&Ox3, appropriately interactive, normal affect   Speech: fluent w/o paraphasic error  Memory: good recent and remote recall  CN: PERRL, EOMI no nystagmus, mild bilateral ptosis, sensation intact to LT V1-V3 bilat, face  symmetric, no weakness, hearing grossly intact, palate elevates symmetrically, shoulder shrug 5/5 bilat,  tongue protrudes midline, no fasiculations noted.  Motor: normal bulk and tone Strength: 5/5  In all extremities No delayed relaxation of muscles  Coord: rapid alternating and point-to-point (FNF, HTS)  movements intact. No resting tremor noted, bilateral intention and postural tremor noted.   Reflexes: symmetrical, bilat downgoing toes  Sens: LT intact in all extremities  Gait: Slow slightly stooped posture small shuffling steps no instability noted   Assessment:  After physical and neurologic examination, review of laboratory studies, imaging, neurophysiology testing and pre-existing records, assessment will be reviewed on the problem list.  Plan:  Treatment plan and additional workup will be reviewed under Problem List.  1)Fatigue 2)Weakness  67 year old woman presenting for initial evaluation of fatigue with questionable diagnosis of mitochondrial myopathy. Main concern today are excessive fatigue, weakness and difficulty breathing. She has been extensively worked up in the past including muscle biopsy, she does not have medical records with her present. Unclear etiology of her symptoms. Will check EMG/NCS, lab workup. Discussed case with Dr Anne Hahn and she will follow up with him for 2nd opinion. She was counseled to bring her old records to the next visit.   Elspeth Cho, DO  [facility] [address] [place], [place] [address]  Phone [phone] Fax [phone]

## 2013-01-10 NOTE — Patient Instructions (Signed)
Overall you are doing fairly well but I do want to suggest a few things today:   As far as diagnostic testing:  1)EMG/NCS. Please schedule with Dr. Lesia Sago 2)Lab work to be completed today  Please schedule a follow up with Dr. Lesia Sago. He is a Advertising account executive.   Please also call us for any test results so we can go over those with you on the phone.  My clinical assistant and will answer any of your questions and relay your messages to me and also relay most of my messages to you.   Our phone number is [phone]. We also have an after hours call service for urgent matters and there is a physician on-call for urgent questions. For any emergencies you know to call 911 or go to the nearest emergency room

## 2013-01-11 ENCOUNTER — Ambulatory Visit (HOSPITAL_BASED_OUTPATIENT_CLINIC_OR_DEPARTMENT_OTHER): Payer: Medicare Other | Admitting: Hematology & Oncology

## 2013-01-11 ENCOUNTER — Telehealth: Payer: Self-pay | Admitting: Hematology & Oncology

## 2013-01-11 ENCOUNTER — Ambulatory Visit (HOSPITAL_BASED_OUTPATIENT_CLINIC_OR_DEPARTMENT_OTHER): Payer: Medicare Other | Admitting: Lab

## 2013-01-11 ENCOUNTER — Ambulatory Visit (HOSPITAL_BASED_OUTPATIENT_CLINIC_OR_DEPARTMENT_OTHER): Payer: Medicare Other

## 2013-01-11 VITALS — BP 117/72 | HR 95 | Resp 20

## 2013-01-11 VITALS — BP 131/62 | HR 84 | Temp 97.9°F | Resp 14 | Ht 63.0 in | Wt 131.0 lb

## 2013-01-11 DIAGNOSIS — R0600 Dyspnea, unspecified: Secondary | ICD-10-CM

## 2013-01-11 DIAGNOSIS — D45 Polycythemia vera: Secondary | ICD-10-CM

## 2013-01-11 DIAGNOSIS — R0602 Shortness of breath: Secondary | ICD-10-CM

## 2013-01-11 DIAGNOSIS — D751 Secondary polycythemia: Secondary | ICD-10-CM

## 2013-01-11 LAB — CBC WITH DIFFERENTIAL (CANCER CENTER ONLY)
BASO#: 0 10*3/uL (ref 0.0–0.2)
BASO%: 0.5 % (ref 0.0–2.0)
EOS%: 2.6 % (ref 0.0–7.0)
Eosinophils Absolute: 0.2 10*3/uL (ref 0.0–0.5)
HCT: 42.6 % (ref 34.8–46.6)
HGB: 14 g/dL (ref 11.6–15.9)
LYMPH#: 0.8 10*3/uL — ABNORMAL LOW (ref 0.9–3.3)
LYMPH%: 14.5 % (ref 14.0–48.0)
MCH: 32.9 pg (ref 26.0–34.0)
MCHC: 32.9 g/dL (ref 32.0–36.0)
MCV: 100 fL (ref 81–101)
MONO#: 0.6 10*3/uL (ref 0.1–0.9)
MONO%: 11 % (ref 0.0–13.0)
NEUT#: 4.1 10*3/uL (ref 1.5–6.5)
NEUT%: 71.4 % (ref 39.6–80.0)
Platelets: 170 10*3/uL (ref 145–400)
RBC: 4.26 10*6/uL (ref 3.70–5.32)
RDW: 11.9 % (ref 11.1–15.7)
WBC: 5.7 10*3/uL (ref 3.9–10.0)

## 2013-01-11 LAB — IRON AND TIBC CHCC
%SAT: 52 % (ref 21–57)
Iron: 190 ug/dL — ABNORMAL HIGH (ref 41–142)
TIBC: 368 ug/dL (ref 236–444)
UIBC: 178 ug/dL (ref 120–384)

## 2013-01-11 LAB — FERRITIN CHCC: Ferritin: 27 ng/ml (ref 9–269)

## 2013-01-11 NOTE — Telephone Encounter (Signed)
Pt called I gave her number to PFT appointments, she said she was going to change appointment. RN aware

## 2013-01-11 NOTE — Progress Notes (Signed)
[name] presents today for phlebotomy per MD orders. Phlebotomy procedure started at 1300 and ended at 1315. 500 grams removed. Patient observed for 30 minutes after procedure without any incident. Patient tolerated procedure well. IV needle removed intact.

## 2013-01-11 NOTE — Progress Notes (Signed)
This office note has been dictated.

## 2013-01-11 NOTE — Patient Instructions (Signed)

## 2013-01-12 NOTE — Progress Notes (Signed)
CC:   [name], M.D.  DIAGNOSES: 1. 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 2. Hemochromatosis (heterozygote mutation for H63D mutation). 3. Mitochondrial myopathy.  CURRENT THERAPY: 1. Phlebotomy to maintain hematocrit below 42%. 2. Aspirin 81 mg p.o. daily.  INTERVAL HISTORY:  [name] comes in for followup.  Supposedly, we did find that she has hemochromatosis.  We did do a genetic assay on her. She is heterozygote for one of the minor genes.  This is the H63D gene. As such, her iron overload has never been all that bad.  Her iron saturation has been on the higher side, which is what triggered the evaluation for hemochromatosis.  We phlebotomized her for polycythemia.  As such, this will help with both polycythemia and the hemochromatosis.  Her main complaint now is that she has some shortness of breath.  This seems to occur with activity.  She has little bit of dry cough.  I do not know if this shortness of breath could be from the mitochondrial issue that she has.  She probably needs to have a pulmonary function test done.  We will go and order that for her.  She has had no fever.  She has had good appetite.  She has had no nausea or vomiting.  She had a nice Thanksgiving.  She invited for Christmas.  When we last saw her, her ferritin was down to 35.  PHYSICAL EXAMINATION:  General:  This is a petite white female in no obvious distress.  Vital Signs:  Temperature of 97.9, pulse 84, respiratory rate 14, blood pressure 131/62, weight is 131 pounds.  Head and Neck:  Normocephalic and atraumatic skull.  There are no ocular or oral lesions.  There are no palpable cervical or supraclavicular lymph nodes.  Lungs:  Clear bilaterally.  Cardiac:  Regular rate and rhythm with a normal S1 and S2.  There are no murmurs, rubs, or bruits. Abdomen:  Soft.  She has good bowel sounds.  There is no fluid wave. There is no palpable abdominal mass.  There is no  palpable hepatosplenomegaly.  Back:  No tenderness over the spine, ribs, or hips. Extremities:  No clubbing, cyanosis, or edema.  Neurological:  No focal neurological deficits.  LABORATORY STUDIES:  White cell count 5.7, hemoglobin 14, hematocrit 43, platelet count 170.  Ferritin is 27.  Iron saturation is 52%.  IMPRESSION:  [name] is a very nice 67 year old white female with a couple hematologic issues now.  She has polycythemia.  We did a bone marrow on her.  Her bone marrow was hypocellular.  There was no dysplastic changes.  Even though she is JAK2 negative, I feel that polycythemia would best be the diagnosis.  She has a very low erythropoietin level, which I think goes along with polycythemia.  She now is found to have hemochromatosis.  Again, this is a minor gene and she is only heterozygous for this.  As such, I do not suspect that she ever will have issues with iron accumulation.  I told her that she could pass this on to her children.  I think the chance of them having this is going to be I think 1/4.  Even if they have it, I would not expect iron accumulation to be a problem.  Again, we will go ahead and get her phlebotomized.  We will plan to get her back to see [place] in another month or so.  Again, we will get the pulmonary function tests on her.  If there is a problem, then she will have to be referred to Pulmonary Medicine.    ______________________________ Josph Macho, M.D. PRE/MEDQ  D:  01/11/2013  T:  01/12/2013  Job:  [id number]

## 2013-01-13 ENCOUNTER — Other Ambulatory Visit (INDEPENDENT_AMBULATORY_CARE_PROVIDER_SITE_OTHER): Payer: Medicare Other

## 2013-01-13 DIAGNOSIS — Z0289 Encounter for other administrative examinations: Secondary | ICD-10-CM

## 2013-01-16 ENCOUNTER — Ambulatory Visit: Payer: Medicare Other | Admitting: Hematology & Oncology

## 2013-01-16 ENCOUNTER — Encounter (HOSPITAL_COMMUNITY): Payer: Medicare Other

## 2013-01-16 ENCOUNTER — Other Ambulatory Visit: Payer: Medicare Other | Admitting: Lab

## 2013-01-18 LAB — HEPATIC FUNCTION PANEL
ALT: 47 IU/L — ABNORMAL HIGH (ref 0–32)
AST: 68 IU/L — ABNORMAL HIGH (ref 0–40)
Albumin: 4 g/dL (ref 3.6–4.8)
Alkaline Phosphatase: 83 IU/L (ref 39–117)
Bilirubin, Direct: 0.31 mg/dL (ref 0.00–0.40)
Total Bilirubin: 1.3 mg/dL — ABNORMAL HIGH (ref 0.0–1.2)
Total Protein: 6.4 g/dL (ref 6.0–8.5)

## 2013-01-18 LAB — CK: Total CK: 86 U/L (ref 24–173)

## 2013-01-18 LAB — CERULOPLASMIN: Ceruloplasmin: 36.2 mg/dL (ref 16.0–45.0)

## 2013-01-18 LAB — ACETYLCHOLINE RECEPTOR, BINDING: AChR Binding Ab, Serum: <0.03 nmol/L (ref 0.00–0.24)

## 2013-01-18 LAB — TSH: TSH: 2.43 u[IU]/mL (ref 0.450–4.500)

## 2013-01-23 ENCOUNTER — Ambulatory Visit (HOSPITAL_COMMUNITY)
Admission: RE | Admit: 2013-01-23 | Discharge: 2013-01-23 | Disposition: A | Discharge status: Home / Self Care | Payer: Medicare Other | Source: Ambulatory Visit | Attending: Hematology & Oncology | Admitting: Hematology & Oncology

## 2013-01-23 DIAGNOSIS — R0609 Other forms of dyspnea: Secondary | ICD-10-CM | POA: Insufficient documentation

## 2013-01-23 DIAGNOSIS — R0600 Dyspnea, unspecified: Secondary | ICD-10-CM

## 2013-01-23 DIAGNOSIS — J988 Other specified respiratory disorders: Secondary | ICD-10-CM | POA: Insufficient documentation

## 2013-01-23 DIAGNOSIS — R0989 Other specified symptoms and signs involving the circulatory and respiratory systems: Secondary | ICD-10-CM | POA: Insufficient documentation

## 2013-01-23 LAB — PULMONARY FUNCTION TEST
DL/VA % pred: 76 %
DL/VA: 3.58 ml/min/mmHg/L
DLCO cor % pred: 52 %
DLCO cor: 12 ml/min/mmHg
DLCO unc % pred: 53 %
DLCO unc: 12.22 ml/min/mmHg
FEF 25-75 Post: 0.85 L/sec
FEF 25-75 Pre: 0.4 L/sec
FEF2575-%Change-Post: 110 %
FEF2575-%Pred-Post: 45 %
FEF2575-%Pred-Pre: 21 %
FEV1-%Change-Post: 33 %
FEV1-%Pred-Post: 53 %
FEV1-%Pred-Pre: 39 %
FEV1-Post: 1.17 L
FEV1-Pre: 0.88 L
FEV1FVC-%Change-Post: 13 %
FEV1FVC-%Pred-Pre: 71 %
FEV6-%Change-Post: 20 %
FEV6-%Pred-Post: 67 %
FEV6-%Pred-Pre: 56 %
FEV6-Post: 1.88 L
FEV6-Pre: 1.56 L
FEV6FVC-%Change-Post: 2 %
FEV6FVC-%Pred-Post: 102 %
FEV6FVC-%Pred-Pre: 99 %
FVC-%Change-Post: 17 %
FVC-%Pred-Post: 65 %
FVC-%Pred-Pre: 56 %
FVC-Post: 1.91 L
FVC-Pre: 1.62 L
Post FEV1/FVC ratio: 61 %
Post FEV6/FVC ratio: 99 %
Pre FEV1/FVC ratio: 54 %
Pre FEV6/FVC Ratio: 96 %
RV % pred: 154 %
RV: 3.28 L
TLC % pred: 103 %
TLC: 5.05 L

## 2013-01-23 MED ORDER — ALBUTEROL SULFATE (5 MG/ML) 0.5% IN NEBU
2.5000 mg | INHALATION_SOLUTION | Freq: Once | RESPIRATORY_TRACT | Status: AC
  Administered 2013-01-23: 2.5 mg via RESPIRATORY_TRACT

## 2013-01-24 ENCOUNTER — Encounter (HOSPITAL_COMMUNITY): Payer: Self-pay

## 2013-01-24 ENCOUNTER — Other Ambulatory Visit: Payer: Self-pay | Admitting: Hematology & Oncology

## 2013-01-24 ENCOUNTER — Telehealth: Payer: Self-pay | Admitting: *Deleted

## 2013-01-24 DIAGNOSIS — R0600 Dyspnea, unspecified: Secondary | ICD-10-CM

## 2013-01-24 MED ORDER — TIOTROPIUM BROMIDE MONOHYDRATE 18 MCG IN CAPS: 18.0000 ug | ORAL_CAPSULE | Freq: Two times a day (BID) | RESPIRATORY_TRACT | Status: DC

## 2013-01-24 NOTE — Telephone Encounter (Signed)
Patient called asking if Dr. Myna Hidalgo had read her PFTS yet.  Dr. Myna Hidalgo read these, determined patient has underlying lung problem. Patient notified. Referral made to [facility].  Another inhaler prescribed for patient.

## 2013-01-27 ENCOUNTER — Telehealth: Payer: Self-pay | Admitting: Hematology & Oncology

## 2013-01-27 NOTE — Telephone Encounter (Signed)
Pt aware of 01-31-12 330 pm with Dr. Gwenette Greet

## 2013-01-30 ENCOUNTER — Other Ambulatory Visit: Payer: Medicare Other

## 2013-01-30 ENCOUNTER — Ambulatory Visit (INDEPENDENT_AMBULATORY_CARE_PROVIDER_SITE_OTHER)
Admission: RE | Admit: 2013-01-30 | Discharge: 2013-01-30 | Disposition: A | Discharge status: Home / Self Care | Payer: Medicare Other | Source: Ambulatory Visit | Attending: Pulmonary Disease | Admitting: Pulmonary Disease

## 2013-01-30 ENCOUNTER — Telehealth: Payer: Self-pay | Admitting: Pulmonary Disease

## 2013-01-30 ENCOUNTER — Ambulatory Visit (INDEPENDENT_AMBULATORY_CARE_PROVIDER_SITE_OTHER): Payer: Medicare Other | Admitting: Pulmonary Disease

## 2013-01-30 ENCOUNTER — Encounter: Payer: Self-pay | Admitting: Pulmonary Disease

## 2013-01-30 VITALS — BP 114/72 | HR 93 | Temp 97.8°F | Ht 63.0 in | Wt 130.0 lb

## 2013-01-30 DIAGNOSIS — R0989 Other specified symptoms and signs involving the circulatory and respiratory systems: Secondary | ICD-10-CM

## 2013-01-30 DIAGNOSIS — J449 Chronic obstructive pulmonary disease, unspecified: Secondary | ICD-10-CM

## 2013-01-30 DIAGNOSIS — R0609 Other forms of dyspnea: Secondary | ICD-10-CM

## 2013-01-30 DIAGNOSIS — R0602 Shortness of breath: Secondary | ICD-10-CM | POA: Diagnosis not present

## 2013-01-30 DIAGNOSIS — R079 Chest pain, unspecified: Secondary | ICD-10-CM | POA: Diagnosis not present

## 2013-01-30 MED ORDER — MOMETASONE FURO-FORMOTEROL FUM 100-5 MCG/ACT IN AERO: 2.0000 | INHALATION_SPRAY | Freq: Two times a day (BID) | RESPIRATORY_TRACT | Status: DC

## 2013-01-30 MED ORDER — ALBUTEROL SULFATE HFA 108 (90 BASE) MCG/ACT IN AERS: 2.0000 | INHALATION_SPRAY | Freq: Four times a day (QID) | RESPIRATORY_TRACT | Status: DC | PRN

## 2013-01-30 NOTE — Assessment & Plan Note (Signed)
The patient has both fixed and reversible airflow obstruction on her PFTs that I suspect is secondary to asthma. I have explained to her that asthma is an inflammatory condition of her airways, and should be treated with inhaled corticosteroids in order to prevent progression of her fixed component. She should also have a long acting beta agonist as well, given a 33% improvement in her FEV1 during PFTs. I will start the patient on a LABA/ICS, and will also check a chest x-ray today for completeness. Finally, given her family history for respiratory disease, and a history of pneumothorax at an early age, I think we need to check an alpha-1 antitrypsin level.

## 2013-01-30 NOTE — Patient Instructions (Signed)
Stop spiriva Start on dulera 100/5  And take 2 inhalations am and pm everyday whether you need it or not.  Rinse mouth well after using. Can use albuterol 2 puffs every 6 hrs for emergencies only if needed.  Will call in a prescription for this. Will check chest xray today and call you with results. Will check bloodwork for an inherited form of early lung disease.  followup with me in 4 weeks to check on your progress.

## 2013-01-30 NOTE — Assessment & Plan Note (Signed)
I suspect the patient's dyspnea on exertion is primarily related to her obstructive lung disease, but certainly her neuromuscular process can contribute to this as well. The good news is that her total lung capacity was normal, but poor conditioning from her muscle weakness can cause shortness of breath as well.

## 2013-01-30 NOTE — Telephone Encounter (Signed)
I called and spoke with pt. She reports Dr. Jonette Eva prescribed spriva for her take BID. She is coming into see Dr. Gwenette Greet this afternoon for new pt appt. Nothing further needed

## 2013-01-30 NOTE — Progress Notes (Signed)
   Subjective:    Patient ID: [name], female    DOB: 05/26/1945, 68 y.o.   MRN: [id number]  HPI The patient is a 68 year old female who I've been asked to see for dyspnea on exertion. The patient has a very complicated medical history, with polycythemia vera, and hemachromatosis, and also has been diagnosed with a mitochondrial myopathy by muscle biopsy at [facility]. She has had dyspnea on exertion for at least 10 years, and feels that it is getting worse. She describes a less than one half block dyspnea on exertion at a moderate pace on flat ground. She will get very winded walking up a flight of stairs, or bringing groceries in from the car. She has a mild cough recently with no mucus production, and thinks this is primarily from postnasal drip.  She has no history of heart disease, and denies any lower extremity edema. She denies having history of asthma, but does have issues with mild seasonal allergies. She also does have some chest tightness associated with her shortness of breath at times. The patient was diagnosed with a pneumothorax at age 68, and has a strong family history for respiratory disease with the majority being smokers. She has not had a recent chest x-ray. She has had pulmonary function studies that showed moderate to severe airflow obstruction with a 33% improvement in FEV1 with bronchodilators. Her total lung capacity was surprisingly normal, and her DLCO was 53% of predicted. She has been started on Spiriva with some improvement in her breathing.   Review of Systems  Constitutional: Negative for fever and unexpected weight change.  HENT: Negative for congestion, dental problem, ear pain, nosebleeds, postnasal drip, rhinorrhea, sinus pressure, sneezing, sore throat and trouble swallowing.   Eyes: Negative for redness and itching.  Respiratory: Positive for cough and shortness of breath. Negative for chest tightness and wheezing.   Cardiovascular: Negative for  palpitations and leg swelling.  Gastrointestinal: Negative for nausea and vomiting.  Genitourinary: Negative for dysuria.  Musculoskeletal: Negative for joint swelling.  Skin: Negative for rash.  Neurological: Negative for headaches.  Hematological: Does not bruise/bleed easily.  Psychiatric/Behavioral: Negative for dysphoric mood. The patient is not nervous/anxious.        Objective:   Physical Exam Constitutional:  Well developed, no acute distress  HENT:  Nares patent without discharge, mild septal deviation to the left with narrowing.  Oropharynx without exudate, palate and uvula are normal  Eyes:  Perrla, eomi, no scleral icterus  Neck:  No JVD, no TMG  Cardiovascular:  Normal rate, regular rhythm, no rubs or gallops.  No murmurs        Intact distal pulses  Pulmonary :  Normal breath sounds, no stridor or respiratory distress   No rales, rhonchi, or wheezing  Abdominal:  Soft, nondistended, bowel sounds present.  No tenderness noted.   Musculoskeletal:  No lower extremity edema noted.  Lymph Nodes:  No cervical lymphadenopathy noted  Skin:  No cyanosis noted  Neurologic:  Alert, appropriate, moves all 4 extremities without obvious deficit.         Assessment & Plan:

## 2013-01-31 ENCOUNTER — Encounter: Payer: Medicare Other | Admitting: Neurology

## 2013-01-31 ENCOUNTER — Encounter: Payer: Medicare Other | Admitting: Radiology

## 2013-02-01 ENCOUNTER — Telehealth: Payer: Self-pay | Admitting: Pulmonary Disease

## 2013-02-01 NOTE — Telephone Encounter (Signed)
Not a lot of options here.  Remind her that she had a 33% improvement in lung function with albuterol, and she needs to stay on an inhaled steroid as well. Options: 1) stop all meds and accept the sob. 2) continue with dulera and see if she adapts over time 3) try a different inhaler in the same family as dulera to see if tolerates better. 4) put on an inhaled steroid alone, but will be missing out on the 33% improvement in lung function.

## 2013-02-01 NOTE — Telephone Encounter (Signed)
Called and spoke with pt and she stated that she started the dulera on Monday evening and she stated that she started shaking and this lasted for several hours.  She stated that on Tuesday when she took the dulera the shaking was her entire body and this lasted for about 4 hours and finally stopped and she stated when this happened she felt great and could breath fine.  She stated when she took this last night the same things happened and since she took it this morning she has not been able to stop shaking, her teeth are chattering, she is having some increase SOB and she feels this is related to the [place].  Pt is requesting recs from [facility]  Please advise. Thanks  No Known Allergies   Current Outpatient Prescriptions on File Prior to Visit  Medication Sig Dispense Refill  . albuterol (PROVENTIL HFA;VENTOLIN HFA) 108 (90 BASE) MCG/ACT inhaler Inhale 2 puffs into the lungs every 6 (six) hours as needed for wheezing or shortness of breath.  1 Inhaler  6  . aspirin 81 MG tablet Take 81 mg by mouth daily.      Marland Kitchen estradiol (VIVELLE-DOT) 0.0375 MG/24HR Place 1 patch onto the skin 2 (two) times a week.      . hydrocortisone valerate cream (WESTCORT) 0.2 % Apply 1 application topically as needed.       Marland Kitchen LORazepam (ATIVAN) 0.5 MG tablet Take 1 tablet (0.5 mg total) by mouth every 12 (twelve) hours as needed for anxiety.  60 tablet  5  . mometasone-formoterol (DULERA) 100-5 MCG/ACT AERO Inhale 2 puffs into the lungs 2 (two) times daily.  1 Inhaler  11  . NON FORMULARY Take by mouth 2 (two) times daily. Glyco Ccarn      . NON FORMULARY Take 100 mg by mouth every morning. Ubiquiniol      . NON FORMULARY Take by mouth 2 (two) times daily. D-Ribose      . progesterone (PROMETRIUM) 100 MG capsule Take 100 mg by mouth daily.       Marland Kitchen tiotropium (SPIRIVA) 18 MCG inhalation capsule Place 18 mcg into inhaler and inhale daily.       No current facility-administered medications on file prior to visit.

## 2013-02-01 NOTE — Telephone Encounter (Signed)
Called and spoke with pt. She reports she will talk this over with her spouse and then decide what she wants to do. She wrote down all her options. She reports once she makes a decision she will back.

## 2013-02-03 LAB — ALPHA-1 ANTITRYPSIN PHENOTYPE: A-1 Antitrypsin: 156 mg/dL (ref 83–199)

## 2013-02-06 ENCOUNTER — Other Ambulatory Visit (HOSPITAL_BASED_OUTPATIENT_CLINIC_OR_DEPARTMENT_OTHER): Payer: Medicare Other | Admitting: Lab

## 2013-02-06 ENCOUNTER — Encounter: Payer: Self-pay | Admitting: Hematology & Oncology

## 2013-02-06 ENCOUNTER — Ambulatory Visit (HOSPITAL_BASED_OUTPATIENT_CLINIC_OR_DEPARTMENT_OTHER): Payer: Medicare Other | Admitting: Hematology & Oncology

## 2013-02-06 ENCOUNTER — Ambulatory Visit (HOSPITAL_BASED_OUTPATIENT_CLINIC_OR_DEPARTMENT_OTHER): Payer: Medicare Other

## 2013-02-06 ENCOUNTER — Telehealth: Payer: Self-pay | Admitting: *Deleted

## 2013-02-06 VITALS — BP 102/62 | HR 78 | Temp 97.7°F | Resp 18

## 2013-02-06 VITALS — BP 121/57 | HR 101 | Temp 97.9°F | Resp 14 | Ht 63.0 in | Wt 131.0 lb

## 2013-02-06 DIAGNOSIS — J4540 Moderate persistent asthma, uncomplicated: Secondary | ICD-10-CM

## 2013-02-06 DIAGNOSIS — D45 Polycythemia vera: Secondary | ICD-10-CM

## 2013-02-06 DIAGNOSIS — J45909 Unspecified asthma, uncomplicated: Secondary | ICD-10-CM

## 2013-02-06 DIAGNOSIS — D510 Vitamin B12 deficiency anemia due to intrinsic factor deficiency: Secondary | ICD-10-CM

## 2013-02-06 LAB — CBC WITH DIFFERENTIAL (CANCER CENTER ONLY)
BASO#: 0 10*3/uL (ref 0.0–0.2)
BASO%: 0.4 % (ref 0.0–2.0)
EOS%: 2 % (ref 0.0–7.0)
Eosinophils Absolute: 0.2 10*3/uL (ref 0.0–0.5)
HCT: 42 % (ref 34.8–46.6)
HGB: 13.4 g/dL (ref 11.6–15.9)
LYMPH#: 0.9 10*3/uL (ref 0.9–3.3)
LYMPH%: 12.7 % — ABNORMAL LOW (ref 14.0–48.0)
MCH: 31.1 pg (ref 26.0–34.0)
MCHC: 31.9 g/dL — ABNORMAL LOW (ref 32.0–36.0)
MCV: 97 fL (ref 81–101)
MONO#: 1 10*3/uL — ABNORMAL HIGH (ref 0.1–0.9)
MONO%: 13.4 % — ABNORMAL HIGH (ref 0.0–13.0)
NEUT#: 5.2 10*3/uL (ref 1.5–6.5)
NEUT%: 71.5 % (ref 39.6–80.0)
Platelets: 174 10*3/uL (ref 145–400)
RBC: 4.31 10*6/uL (ref 3.70–5.32)
RDW: 12.7 % (ref 11.1–15.7)
WBC: 7.3 10*3/uL (ref 3.9–10.0)

## 2013-02-06 LAB — COMPREHENSIVE METABOLIC PANEL
ALT: 34 U/L (ref 0–35)
AST: 44 U/L — ABNORMAL HIGH (ref 0–37)
Albumin: 3.9 g/dL (ref 3.5–5.2)
Alkaline Phosphatase: 87 U/L (ref 39–117)
BUN: 8 mg/dL (ref 6–23)
CO2: 24 mEq/L (ref 19–32)
Calcium: 9 mg/dL (ref 8.4–10.5)
Chloride: 102 mEq/L (ref 96–112)
Creatinine, Ser: 0.68 mg/dL (ref 0.50–1.10)
Glucose, Bld: 96 mg/dL (ref 70–99)
Potassium: 4 mEq/L (ref 3.5–5.3)
Sodium: 142 mEq/L (ref 135–145)
Total Bilirubin: 0.8 mg/dL (ref 0.3–1.2)
Total Protein: 6.8 g/dL (ref 6.0–8.3)

## 2013-02-06 LAB — IRON AND TIBC CHCC
%SAT: 10 % — ABNORMAL LOW (ref 21–57)
Iron: 37 ug/dL — ABNORMAL LOW (ref 41–142)
TIBC: 389 ug/dL (ref 236–444)
UIBC: 352 ug/dL (ref 120–384)

## 2013-02-06 LAB — FERRITIN CHCC: Ferritin: 22 ng/ml (ref 9–269)

## 2013-02-06 MED ORDER — BENZONATATE 100 MG PO CAPS: 100.0000 mg | ORAL_CAPSULE | Freq: Three times a day (TID) | ORAL | Status: DC | PRN

## 2013-02-06 MED ORDER — BECLOMETHASONE DIPROPIONATE 40 MCG/ACT IN AERS: 2.0000 | INHALATION_SPRAY | Freq: Two times a day (BID) | RESPIRATORY_TRACT | Status: DC

## 2013-02-06 MED ORDER — LEVALBUTEROL TARTRATE 45 MCG/ACT IN AERO: 2.0000 | INHALATION_SPRAY | Freq: Three times a day (TID) | RESPIRATORY_TRACT | Status: DC

## 2013-02-06 NOTE — Telephone Encounter (Signed)
Pt taking Dulera 1 puff twice daily Has decreased dose from 2 puffs to 1 pudd BID d/t reaction/side effects of teeth chattering, hands shaking and feet tapping. Pt states that this feels like an "anxiety attack on her insides"   Would like to know what can be tried in its place. Please advise Dr Gwenette Greet. Thanks.

## 2013-02-06 NOTE — Progress Notes (Signed)
This office note has been dictated.

## 2013-02-06 NOTE — Progress Notes (Signed)
[name] presents today for phlebotomy per MD orders. Phlebotomy procedure started at 1205 and ended at 1210. 500 grams removed. Patient observed for 30 minutes after procedure without any incident. Patient tolerated procedure well. IV needle removed intact. PO nourishments provided and pt observed.

## 2013-02-06 NOTE — Telephone Encounter (Signed)
There are no other combination drugs that will have less side effects.  We can split the medications so that one is the long acting albuterol, and the other is the inhaled steroids  striverdi  Two inhalations each am qvar 80  Take 2 inhalations each am AND pm with spacer.  Rinse mouth well after using.   She will need to come by and be instructed on how to use striverdi, and get samples of both to use for next 2 weeks She needs to give me feedback in 2 weeks.

## 2013-02-06 NOTE — Patient Instructions (Signed)
Therapeutic Phlebotomy Therapeutic phlebotomy is the controlled removal of blood from your body for the purpose of treating a medical condition. It is similar to donating blood. Usually, about a pint (470 mL) of blood is removed. The average adult has 9 to 12 pints (4.3 to 5.7 L) of blood. Therapeutic phlebotomy may be used to treat the following medical conditions:  Hemochromatosis. This is a condition in which there is too much iron in the blood.  Polycythemia vera. This is a condition in which there are too many red cells in the blood.  Porphyria cutanea tarda. This is a disease usually passed from one generation to the next (inherited). It is a condition in which an important part of hemoglobin is not made properly. This results in the build up of abnormal amounts of porphyrins in the body.  Sickle cell disease. This is an inherited disease. It is a condition in which the red blood cells form an abnormal crescent shape rather than a round shape. LET YOUR CAREGIVER KNOW ABOUT:  Allergies.  Medicines taken including herbs, eyedrops, over-the-counter medicines, and creams.  Use of steroids (by mouth or creams).  Previous problems with anesthetics or numbing medicine.  History of blood clots.  History of bleeding or blood problems.  Previous surgery.  Possibility of pregnancy, if this applies. RISKS AND COMPLICATIONS This is a simple and safe procedure. Problems are unlikely. However, problems can occur and may include:  Nausea or lightheadedness.  Low blood pressure.  Soreness, bleeding, swelling, or bruising at the needle insertion site.  Infection. BEFORE THE PROCEDURE  This is a procedure that can be done as an outpatient. Confirm the time that you need to arrive for your procedure. Confirm whether there is a need to fast or withhold any medications. It is helpful to wear clothing with sleeves that can be raised above the elbow. A blood sample may be done to determine the  amount of red blood cells or iron in your blood. Plan ahead of time to have someone drive you home after the procedure. PROCEDURE The entire procedure from preparation through recovery takes about 1 hour. The actual collection takes about 10 to 15 minutes.  A needle will be inserted into your vein.  Tubing and a collection bag will be attached to that needle.  Blood will flow through the needle and tubing into the collection bag.  You may be asked to open and close your hand slowly and continuously during the entire collection.  Once the specified amount of blood has been removed from your body, the collection bag and tubing will be clamped.  The needle will be removed.  Pressure will be held on the site of the needle insertion to stop the bleeding. Then a bandage will be placed over the needle insertion site. AFTER THE PROCEDURE  Your recovery will be assessed and monitored. If there are no problems, as an outpatient, you should be able to go home shortly after the procedure.  Document Released: 06/16/2010 Document Revised: 04/06/2011 Document Reviewed: 06/16/2010 Exit[employer] Patient Information 2014 [employer].  

## 2013-02-06 NOTE — Telephone Encounter (Signed)
Patient aware of recs per [place] and change in meds. Pt to come pick up meds tomorrow (02/07/13), needs nurse to demonstrate how to use. Instructions written on bag

## 2013-02-07 ENCOUNTER — Telehealth: Payer: Self-pay | Admitting: Pulmonary Disease

## 2013-02-07 DIAGNOSIS — J4540 Moderate persistent asthma, uncomplicated: Secondary | ICD-10-CM

## 2013-02-07 MED ORDER — BENZONATATE 100 MG PO CAPS: 100.0000 mg | ORAL_CAPSULE | Freq: Three times a day (TID) | ORAL | Status: DC | PRN

## 2013-02-07 NOTE — Progress Notes (Signed)
DIAGNOSES: 1. 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 2. Hemochromatosis (H63D heterozygote mutation). 3. Asthma. 4. Mitochondrial myopathy.  CURRENT THERAPY: 1. Phlebotomy to maintain hematocrit below 42%. 2. Aspirin 81 mg p.o. daily.  INTERIM HISTORY:  Ms. Paternoster comes in for followup.  Now, she has a new problem.  She has asthma.  She was complaining of shortness of breath and dyspnea.  We went ahead and got a pulmonary function tests on her.  She had restrictive lung disease.  There also may have been obstructive lung disease.  She has seen Dr. Gwenette Greet.  Dr. Gwenette Greet feels that she has asthma.  He put her on inhaler.  This has been a very difficult for her to tolerate.  She gets very jittery and nervous when she takes the inhaler. I think the inhaler is Dulera.  She says she was scaring to handle this inhaler.  We did have her on Spiriva which she said worked very well for her.  Right now, bleeding seems be her biggest issue.  I do not know if this asthma might be related to this mitochondrial myopathy that she has.  I went ahead and wrote a prescription for Xopenex inhaler and a steroid inhaler (QVAR).  Hopefully, these 2 can help her until she sees Dr. Gwenette Greet.  As far as her iron studies are concerned, we are trying to get the iron out of her system.  Today, her ferritin is down to 22 with an iron saturation of 10%.  She has had no fever.  She has had no bleeding.  There has been no change in bowel or bladder habits.  PHYSICAL EXAMINATION:  General:  This is a petite white female, in no obvious distress.  Vital Signs:  Temperature of 97.9, pulse 101, respiratory rate 20, blood pressure 121/57.  Weight is 131 pounds.  Head and Neck:  Normocephalic, atraumatic skull.  There are no ocular or oral lesions.  There are no palpable cervical or supraclavicular lymph nodes. Lungs:  Show wheezes bilaterally.  She has decent air movement bilaterally.  Cardiac:  Regular rate and rhythm  with a normal S1, S2. There are no murmurs, rubs, or bruits.  Abdomen:  Soft.  She has good bowel sounds.  There is no fluid wave.  There is no palpable abdominal mass.  There is no palpable hepatosplenomegaly.  Extremities:  Shows no clubbing, cyanosis, or edema.  Neurological:  Shows no focal neurological deficits.  LABORATORY STUDIES:  White cell count is 7.3, hemoglobin 13.4, hematocrit 42, platelet count 174.  IMPRESSION:  Ms. Randle is a nice 68 year old white female with multiple problems.  She has polycythemia.  We did a bone marrow on her, which showed hypercellular marrow.  Even though she is JAK2 negative, she has a very low erythropoietin level which I felt was consistent with polycythemia.  She has been checked for alpha 1 antitrypsin deficiency.  I think she had a normal alpha 1 antitrypsin level.  She also is checked for ceruloplasmin and this was normal.  We will go ahead and phlebotomize her.  She really likes to have her blood count below 42%.  Again, I do not think hemochromatosis will be a problem for her.  Her ferritin was only 22 with a iron saturation of 10%.  We will plan to get her back in 1 more month.  There is to be a reasonable __________.    ______________________________ [name], M.D. PRE/MEDQ  D:  02/06/2013  T:  02/07/2013  Job:  [id number]

## 2013-02-07 NOTE — Telephone Encounter (Signed)
I called and spoke with pt. She reports she has terrible cough. It is a non productive cough, chest tx, increase SOB (but not able to use the inhaler bc of the coughing). Today she will start striverdi and QVAR. The tessalon pearls are on back order and can't get this right now. She is asking recs for something else to be called in for her cough. Please advise [name] thanks  No Known Allergies

## 2013-02-07 NOTE — Telephone Encounter (Signed)
Called pt to see which pharmacy she would like for me to call and see if in stock. She reports I can try [facility]. She will send her spouse to pick them up. [facility] in stock per the pharmacists. RX has been sent. Nothing further needed

## 2013-02-07 NOTE — Telephone Encounter (Signed)
She needs to go to a different pharmacy to get the pearls if on back order at her usual drug store.  That medication is available, she just has to make the effort to get it. Her cough and breathing should get better once she gets on the inhalers as prescribed.

## 2013-02-13 ENCOUNTER — Telehealth: Payer: Self-pay | Admitting: Pulmonary Disease

## 2013-02-13 NOTE — Telephone Encounter (Signed)
Called spoke with patient who c/o prod cough with clear "slimy" mucus, increased SOB, wheezing, chest tightness, head congestion w/ PND, clear nasal drianage x10 days - denies any f/c/s, nausea, vomiting, edema.  Pt states antihistamines are the only meds that are helping.  Has been taking Tessalon Perles w/ no relief.  Pt is requesting rx be called in.  Dr Gwenette Greet please advise, thank you.  [facility] NKDA - verified  Last ov 1.5.15 w/ KC: Patient Instructions     Stop spiriva  Start on dulera 100/5 And take 2 inhalations am and pm everyday whether you need it or not. Rinse mouth well after using.  Can use albuterol 2 puffs every 6 hrs for emergencies only if needed. Will call in a prescription for this.  Will check chest xray today and call you with results.  Will check bloodwork for an inherited form of early lung disease.  followup with me in 4 weeks to check on your progress.

## 2013-02-13 NOTE — Telephone Encounter (Signed)
Pt is taking her inhalers as prescribed. Advised her to take Chlorpheniramine and she states that's what she is currently taking. She will call if she has any further problems. ROV was confirmed with her for 03/01/13.

## 2013-02-13 NOTE — Telephone Encounter (Signed)
The first and most important question:  Is she taking the striverdi and qvar as directed everyday???  If she is not, it will be difficult to control the cough since she has asthma and is not taking her meds. If she is taking compliantly everyday, then she should stop all of her current antihistamines, and start on chlorpheniramine 4mg , take 2 at bedtime and one at lunch everyday to see if helps the cough.  Keep hard candy in her mouth during the day to help with tickle.   She should have an upcoming apptm with me, but may need to be seen sooner if she continues to have issues.

## 2013-02-20 ENCOUNTER — Telehealth: Payer: Self-pay | Admitting: Pulmonary Disease

## 2013-02-20 ENCOUNTER — Encounter: Payer: Self-pay | Admitting: Emergency Medicine

## 2013-02-20 ENCOUNTER — Ambulatory Visit (INDEPENDENT_AMBULATORY_CARE_PROVIDER_SITE_OTHER): Payer: Medicare Other | Admitting: Emergency Medicine

## 2013-02-20 VITALS — BP 118/70 | HR 85 | Temp 97.7°F | Ht 63.0 in | Wt 131.0 lb

## 2013-02-20 DIAGNOSIS — J449 Chronic obstructive pulmonary disease, unspecified: Secondary | ICD-10-CM

## 2013-02-20 DIAGNOSIS — R079 Chest pain, unspecified: Secondary | ICD-10-CM | POA: Diagnosis not present

## 2013-02-20 NOTE — Patient Instructions (Signed)
Please stop Striverdi for now. Continue your QVAR twice a day. This will help [place] determine which inhaler is influencing your cough the most. If you continue to cough after 1 week, then call our office to see if you should stop this one as well.  Use ibuprofen as needed for your chest pain.  Use Delsym OTC as directed for cough Follow with Dr Gwenette Greet in 1 month to review your status

## 2013-02-20 NOTE — Progress Notes (Signed)
HPI:  68 yo never smoker, hx hemachromatosis, mitochondrial myopathy, polycythemia, followed by Dr Gwenette Greet for allergies and asthma with documented AFL and a BD response. She presents today c/o increased cough that has been non-productive and which seemed to coincide in time with the initiation of her QVAR and Striverdi. More recently she has produced white streaky mucous, no purulent material. Her cough has led to some R upper chest pain that is pleuritic, reproducible with palpation.    Past Medical History  Diagnosis Date  . Pericarditis     age 36  . Pneumothorax   . Anxiety   . Iliotibial band syndrome     left knee  . Vitamin D deficiency   . Migraine   . Asthma   . Anemia   . IBS (irritable bowel syndrome)   . Hemorrhoids   . Cholelithiasis   . Mitochondrial myopathy   . Heart attack   . Osteoporosis   . Allergy   . Blood transfusion without reported diagnosis   . Polycythemia vera(238.4) 06/17/2012     Family History  Problem Relation Age of Onset  . Asthma Mother   . Arthritis Mother   . Depression Mother   . Cancer Father   . Emphysema Father   . Stroke Father   . Alcohol abuse Father   . Heart attack Father   . Colon cancer Neg Hx      History   Social History  . Marital Status: Married    Spouse Name: Richard    Number of Children: 2  . Years of Education: Grad   Occupational History  . Psychologist, educational    Social History Main Topics  . Smoking status: Never Smoker   . Smokeless tobacco: Never Used     Comment: never used product  . Alcohol Use: 1.2 oz/week    2 Glasses of wine per week     Comment: occasionally  . Drug Use: No  . Sexual Activity: Not on file   Other Topics Concern  . Not on file   Social History Narrative   Health Care POA:    Emergency Contact: husband, Francene Finders, ([phone]   End of Life Plan:    Who lives with you: husband   Any pets: none   Diet: Pt has a varied diet.  Eats 5 sm. meals throughout day,  focuses on protein, doesn't care for fruits and vegetables very much.   Exercise: Pt has a personal training and exercises several times a week.   Seatbelts: Pt reports wearing seatbelt when in vehicle.   Nancy Fetter Exposure/Protection: Pt reports wearing sun protection.    Hobbies: reading, visiting with friends   Patient has a Scientist, water quality.   Patient has two children.   Patient is retired.   Patient does not drink any caffeine.   Patient is right handed.              No Known Allergies   Outpatient Prescriptions Prior to Visit  Medication Sig Dispense Refill  . aspirin 81 MG tablet Take 81 mg by mouth daily.      . hydrocortisone valerate cream (WESTCORT) 0.2 % Apply 1 application topically as needed.       Marland Kitchen LORazepam (ATIVAN) 0.5 MG tablet Take 1 tablet (0.5 mg total) by mouth every 12 (twelve) hours as needed for anxiety.  60 tablet  5  . progesterone (PROMETRIUM) 100 MG capsule Take 100 mg by mouth daily.       Marland Kitchen  estradiol (VIVELLE-DOT) 0.0375 MG/24HR Place 1 patch onto the skin 2 (two) times a week.      . beclomethasone (QVAR) 40 MCG/ACT inhaler Inhale 2 puffs into the lungs 2 (two) times daily.  1 Inhaler  12  . benzonatate (TESSALON) 100 MG capsule Take 1 capsule (100 mg total) by mouth 3 (three) times daily as needed for cough.  30 capsule  2  . levalbuterol (XOPENEX HFA) 45 MCG/ACT inhaler Inhale 2 puffs into the lungs 3 (three) times daily.  1 Inhaler  12  . NON FORMULARY Take by mouth 2 (two) times daily. Glyco Ccarn      . NON FORMULARY Take 100 mg by mouth every morning. Ubiquiniol      . NON FORMULARY Take by mouth 2 (two) times daily. D-Ribose       No facility-administered medications prior to visit.     Filed Vitals:   02/20/13 1553  BP: 118/70  Pulse: 85  Temp: 97.7 F (36.5 C)  TempSrc: Oral  Height: 5\' 3"  (1.6 m)  Weight: 131 lb (59.421 kg)  SpO2: 96%   Gen: Pleasant, well-nourished, in no distress,  normal affect  ENT: No lesions,  mouth clear,   oropharynx clear, no postnasal drip  Neck: No JVD, no TMG, no carotid bruits  Lungs: No use of accessory muscles, clear without rales or rhonchi  Cardiovascular: RRR, heart sounds normal, no murmur or gallops, no peripheral edema  Musculoskeletal: No deformities, no cyanosis or clubbing, pain on palpation R chest wall  Neuro: alert, non focal  Skin: Warm, no lesions or rashes   Chest pain Believe this relates to her persistent cough. Will treat with NSAIDS and follow for resolution.   Chronic obstructive asthma, unspecified She appears to have a cough intolerance to one (? Both) of her new inhaled meds. I will stop the Striverdi, continue the QVAR and see if the cough resolves. Hopefully this will be the case since she will benefit from staying on an ICS.

## 2013-02-20 NOTE — Telephone Encounter (Signed)
Spoke with pt. appt scheduled with RB this afternoon. Nothing further needed

## 2013-02-23 ENCOUNTER — Encounter: Payer: Self-pay | Admitting: Internal Medicine

## 2013-03-01 ENCOUNTER — Ambulatory Visit (INDEPENDENT_AMBULATORY_CARE_PROVIDER_SITE_OTHER): Payer: Medicare Other | Admitting: Pulmonary Disease

## 2013-03-01 ENCOUNTER — Encounter: Payer: Self-pay | Admitting: Pulmonary Disease

## 2013-03-01 VITALS — BP 102/60 | HR 90 | Temp 97.8°F | Ht 63.0 in | Wt 132.8 lb

## 2013-03-01 DIAGNOSIS — J449 Chronic obstructive pulmonary disease, unspecified: Secondary | ICD-10-CM | POA: Diagnosis not present

## 2013-03-01 DIAGNOSIS — Z23 Encounter for immunization: Secondary | ICD-10-CM | POA: Diagnosis not present

## 2013-03-01 NOTE — Progress Notes (Signed)
   Subjective:    Patient ID: [name], female    DOB: Oct 09, 1945, 68 y.o.   MRN: [id number]  HPI The patient comes in today for followup of her known chronic obstructive asthma.  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 At the last visit, and try her on dulera along with an antihistamine for postnasal drip. She was unable to tolerate this because of cough and jitteriness. She was then changed to Qvar for small particles size and as well as striverdi, but continued to have persistent cough. She was seen by one of my partners where the LABA was discontinued, and she was maintained on Qvar with a spacer.  She has seen a definite improvement in her breathing, and she is able to climb stairs without becoming dyspneic. Her cough is greatly improved since discontinuing the striverdi, but she is still having some postnasal drip with throat clearing. She is taking an antihistamine only as needed. She tells me that her cough has improved at least 80% since the last visit.   Review of Systems  Constitutional: Negative for fever and unexpected weight change.  HENT: Negative for congestion, dental problem, ear pain, nosebleeds, postnasal drip, rhinorrhea, sinus pressure, sneezing, sore throat and trouble swallowing.   Eyes: Negative for redness and itching.  Respiratory: Positive for cough, chest tightness, shortness of breath and wheezing.   Cardiovascular: Negative for palpitations and leg swelling. Chest pain: ? pulled muscle R chest.  Gastrointestinal: Negative for nausea and vomiting.  Genitourinary: Negative for dysuria.  Musculoskeletal: Negative for joint swelling.  Skin: Negative for rash.  Neurological: Negative for headaches.  Hematological: Does not bruise/bleed easily.  Psychiatric/Behavioral: Negative for dysphoric mood. The patient is not nervous/anxious.        Objective:   Physical Exam Well-developed female in no acute distress Nose without  purulence or discharge noted Neck without lymphadenopathy or thyromegaly Chest totally clear to auscultation, no wheezing Cardiac exam with regular rate and rhythm Lower extremities without edema, no cyanosis Alert and oriented, moves all 4 extremities.       Assessment & Plan:

## 2013-03-01 NOTE — Patient Instructions (Signed)
Continue qvar twice a day as you are doing, and keep mouth rinsed well. Would try and stay on some type of antihistamine a little longer to prevent post-nasal drip and throat irritation that leads to cough. Let me know if your allergy symptoms continue, and we can try a nasal spray that may be more beneficial. If you are doing well, followup with me again in 49mos.

## 2013-03-01 NOTE — Assessment & Plan Note (Signed)
The patient states that her breathing is much improved since being on inhaled corticosteroids. It is unclear how much of this will be fixed obstruction, versus reversible. She was not able to tolerate LABA because of irritation to her upper airway, so we'll continue her on Qvar alone with a spacer. As her airway inflammation improves, I suspect that her flows will improve as well. We will also need to continue treating her postnasal drip in light of her irritable larynx syndrome. May consider adding the LABA back once her airway inflammation and upper airway irritability resolves.

## 2013-03-02 ENCOUNTER — Telehealth: Payer: Self-pay | Admitting: Pulmonary Disease

## 2013-03-02 NOTE — Telephone Encounter (Signed)
Called and spoke with pt and she stated that she received the PNA vaccine yesterday.  She stated that when she got up this morning her arm is red and swollen and she was having some flu like symptoms.  She  C/o stomach upset today and a disoriented feeling.  Pt stated that she has a very sensitive system.  She has taken ibuprofen today and i advised her to use a cool compress to her arm.  Pt is requesting any further recs that she may try.  CY please advise. Thanks  Last ov--03/01/2013 Next ov--08/29/13  No Known Allergies    Current Outpatient Prescriptions on File Prior to Visit  Medication Sig Dispense Refill  . aspirin 81 MG tablet Take 81 mg by mouth daily.      . beclomethasone (QVAR) 80 MCG/ACT inhaler Inhale 2 puffs into the lungs 2 (two) times daily.      Marland Kitchen estradiol (VIVELLE-DOT) 0.0375 MG/24HR Place 1 patch onto the skin 2 (two) times a week.      . hydrocortisone valerate cream (WESTCORT) 0.2 % Apply 1 application topically as needed.       Marland Kitchen LORazepam (ATIVAN) 0.5 MG tablet Take 1 tablet (0.5 mg total) by mouth every 12 (twelve) hours as needed for anxiety.  60 tablet  5  . progesterone (PROMETRIUM) 100 MG capsule Take 100 mg by mouth daily.        No current facility-administered medications on file prior to visit.

## 2013-03-02 NOTE — Telephone Encounter (Signed)
Spoke with the pt and notified of recs per [facility]  She verbalized understanding  Nothing further needed 

## 2013-03-02 NOTE — Telephone Encounter (Signed)
Per Cy-Ibuprofen or Tylenol and cool compresses will take care of this.

## 2013-03-06 ENCOUNTER — Other Ambulatory Visit (HOSPITAL_BASED_OUTPATIENT_CLINIC_OR_DEPARTMENT_OTHER): Payer: Medicare Other | Admitting: Lab

## 2013-03-06 ENCOUNTER — Encounter: Payer: Self-pay | Admitting: Hematology & Oncology

## 2013-03-06 ENCOUNTER — Ambulatory Visit (HOSPITAL_BASED_OUTPATIENT_CLINIC_OR_DEPARTMENT_OTHER): Payer: Medicare Other | Admitting: Hematology & Oncology

## 2013-03-06 ENCOUNTER — Ambulatory Visit (HOSPITAL_BASED_OUTPATIENT_CLINIC_OR_DEPARTMENT_OTHER): Payer: Medicare Other

## 2013-03-06 VITALS — BP 105/72 | HR 86 | Resp 18

## 2013-03-06 VITALS — BP 125/66 | HR 86 | Temp 97.4°F | Resp 14 | Ht 63.0 in | Wt 132.0 lb

## 2013-03-06 DIAGNOSIS — D751 Secondary polycythemia: Secondary | ICD-10-CM

## 2013-03-06 DIAGNOSIS — D45 Polycythemia vera: Secondary | ICD-10-CM

## 2013-03-06 DIAGNOSIS — J4540 Moderate persistent asthma, uncomplicated: Secondary | ICD-10-CM

## 2013-03-06 DIAGNOSIS — D510 Vitamin B12 deficiency anemia due to intrinsic factor deficiency: Secondary | ICD-10-CM

## 2013-03-06 LAB — CBC WITH DIFFERENTIAL (CANCER CENTER ONLY)
BASO#: 0.1 10*3/uL (ref 0.0–0.2)
BASO%: 0.6 % (ref 0.0–2.0)
EOS%: 1.8 % (ref 0.0–7.0)
Eosinophils Absolute: 0.2 10*3/uL (ref 0.0–0.5)
HCT: 41.6 % (ref 34.8–46.6)
HGB: 13.1 g/dL (ref 11.6–15.9)
LYMPH#: 1.2 10*3/uL (ref 0.9–3.3)
LYMPH%: 13 % — ABNORMAL LOW (ref 14.0–48.0)
MCH: 29.7 pg (ref 26.0–34.0)
MCHC: 31.5 g/dL — ABNORMAL LOW (ref 32.0–36.0)
MCV: 94 fL (ref 81–101)
MONO#: 0.9 10*3/uL (ref 0.1–0.9)
MONO%: 9.5 % (ref 0.0–13.0)
NEUT#: 6.8 10*3/uL — ABNORMAL HIGH (ref 1.5–6.5)
NEUT%: 75.1 % (ref 39.6–80.0)
Platelets: 220 10*3/uL (ref 145–400)
RBC: 4.41 10*6/uL (ref 3.70–5.32)
RDW: 13.8 % (ref 11.1–15.7)
WBC: 9.1 10*3/uL (ref 3.9–10.0)

## 2013-03-06 NOTE — Progress Notes (Signed)
Hematology and Oncology Follow Up Visit  LICET DUNPHY [id number] September 29, 1945 68 y.o. 03/06/2013   Principle Diagnosis:  1. 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 2. Hemochromatosis (H63D heterozygote mutation). 3. Asthma. 4. Mitochondrial myopathy.  Current Therapy:   1. Phlebotomy to maintain hematocrit below 42%. 2. Aspirin 81 mg p.o. daily.     Interim History:   Mrs.  Soule is back for followup. She's doing fairly well. She does have asthma. She was seen by pulmonary medicine. She had pulmonary function test done. She is on an inhaler now. This does make her feel a bit better.  She feels that phlebotomies also help her. She does not feel as tired. We'll last saw her, her ferritin was down to 22 with iron saturation of 10%.  Her appetite is doing fairly well. She's had no problems with bowels or bladder.  She's had no leg swelling. There's been no rashes.  Medications: Current outpatient prescriptions:aspirin 81 MG tablet, Take 81 mg by mouth daily., Disp: , Rfl: ;  beclomethasone (QVAR) 80 MCG/ACT inhaler, Inhale 2 puffs into the lungs 2 (two) times daily., Disp: , Rfl: ;  hydrocortisone valerate cream (WESTCORT) 0.2 %, Apply 1 application topically as needed. , Disp: , Rfl:  LORazepam (ATIVAN) 0.5 MG tablet, Take 1 tablet (0.5 mg total) by mouth every 12 (twelve) hours as needed for anxiety., Disp: 60 tablet, Rfl: 5;  progesterone (PROMETRIUM) 100 MG capsule, Take 100 mg by mouth daily. , Disp: , Rfl:   Allergies: No Known Allergies  Past Medical History, Surgical history, Social history, and Family History were reviewed and updated.  Review of Systems: As above Physical Exam:  height is 5\' 3"  (1.6 m) and weight is 132 lb (59.875 kg). Her oral temperature is 97.4 F (36.3 C). Her blood pressure is 125/66 and her pulse is 86. Her respiration is 14.   This is a petite white female in no obvious distress. Her head and neck exam shows Dr. oral lesions. She has no palpable cervical  or supraclavicular lymph nodes. There is no scleral icterus. No mucositis is noted. No adenopathy noted in the neck. Lungs are clear bilaterally. There may be some slight wheezing. She has good air movement. Cardiac exam regular in rhythm with no murmurs rubs or bruits. Abdomen soft. She has good bowel sounds. There is no fluid wave. There is no palpable hepatosplenomegaly extremities shows some osteophytic changes in her joints. She has good strength in her arms legs. Skin exam no rashes. Neurological exam shows no focal neurological deficits.  Lab Results  Component Value Date   WBC 9.1 03/06/2013   HGB 13.1 03/06/2013   HCT 41.6 03/06/2013   MCV 94 03/06/2013   PLT 220 03/06/2013     Chemistry      Component Value Date/Time   NA 142 02/06/2013 1040   K 4.0 02/06/2013 1040   CL 102 02/06/2013 1040   CO2 24 02/06/2013 1040   BUN 8 02/06/2013 1040   CREATININE 0.68 02/06/2013 1040   CREATININE 0.68 04/06/2012 1627      Component Value Date/Time   CALCIUM 9.0 02/06/2013 1040   ALKPHOS 87 02/06/2013 1040   AST 44* 02/06/2013 1040   ALT 34 02/06/2013 1040   BILITOT 0.8 02/06/2013 1040         Impression and Plan: Mrs.Nyliah Nierenberg is a 68 year old white female. She does have I hemochromatosis. She is a heterozygote for the H63D mutation. As such, this should not be to severe with iron  retention.  She does feels better with a low hematocrit. As such, we will phlebotomize her today.  I'll plan to get her back in one month.   [name], MD 2/9/20151:12 PM

## 2013-03-06 NOTE — Progress Notes (Signed)
[name] presents today for phlebotomy per MD orders. Phlebotomy procedure started at 1305 and ended at 1315. 500 ml removed. Patient observed for 30 minutes after procedure without any incident. Patient tolerated procedure well. IV needle removed intact.

## 2013-03-06 NOTE — Patient Instructions (Signed)

## 2013-03-07 LAB — IRON AND TIBC CHCC
%SAT: 9 % — ABNORMAL LOW (ref 21–57)
Iron: 35 ug/dL — ABNORMAL LOW (ref 41–142)
TIBC: 396 ug/dL (ref 236–444)
UIBC: 361 ug/dL (ref 120–384)

## 2013-03-07 LAB — FERRITIN CHCC: Ferritin: 27 ng/ml (ref 9–269)

## 2013-03-28 ENCOUNTER — Telehealth: Payer: Self-pay | Admitting: Pulmonary Disease

## 2013-03-28 NOTE — Telephone Encounter (Signed)
Sample of Qvar is ready to be picked up. Pt is aware. Nothing further is needed.

## 2013-04-03 ENCOUNTER — Other Ambulatory Visit: Payer: Medicare Other | Admitting: Lab

## 2013-04-03 ENCOUNTER — Ambulatory Visit: Payer: Medicare Other | Admitting: Hematology & Oncology

## 2013-04-03 NOTE — Assessment & Plan Note (Signed)
She appears to have a cough intolerance to one (? Both) of her new inhaled meds. I will stop the Striverdi, continue the QVAR and see if the cough resolves. Hopefully this will be the case since she will benefit from staying on an ICS.

## 2013-04-03 NOTE — Assessment & Plan Note (Signed)
Believe this relates to her persistent cough. Will treat with NSAIDS and follow for resolution.

## 2013-04-06 ENCOUNTER — Ambulatory Visit (HOSPITAL_BASED_OUTPATIENT_CLINIC_OR_DEPARTMENT_OTHER): Payer: Medicare Other | Admitting: Hematology & Oncology

## 2013-04-06 ENCOUNTER — Other Ambulatory Visit (HOSPITAL_BASED_OUTPATIENT_CLINIC_OR_DEPARTMENT_OTHER): Payer: Medicare Other | Admitting: Lab

## 2013-04-06 ENCOUNTER — Encounter: Payer: Self-pay | Admitting: Hematology & Oncology

## 2013-04-06 ENCOUNTER — Ambulatory Visit: Payer: Self-pay

## 2013-04-06 VITALS — BP 126/56 | HR 69 | Temp 97.8°F | Resp 14 | Ht 63.0 in | Wt 133.0 lb

## 2013-04-06 DIAGNOSIS — D45 Polycythemia vera: Secondary | ICD-10-CM | POA: Diagnosis not present

## 2013-04-06 DIAGNOSIS — D510 Vitamin B12 deficiency anemia due to intrinsic factor deficiency: Secondary | ICD-10-CM

## 2013-04-06 DIAGNOSIS — R0609 Other forms of dyspnea: Secondary | ICD-10-CM

## 2013-04-06 DIAGNOSIS — R0989 Other specified symptoms and signs involving the circulatory and respiratory systems: Secondary | ICD-10-CM | POA: Diagnosis not present

## 2013-04-06 DIAGNOSIS — G729 Myopathy, unspecified: Secondary | ICD-10-CM

## 2013-04-06 DIAGNOSIS — J449 Chronic obstructive pulmonary disease, unspecified: Secondary | ICD-10-CM

## 2013-04-06 LAB — CBC WITH DIFFERENTIAL (CANCER CENTER ONLY)
BASO#: 0 10*3/uL (ref 0.0–0.2)
BASO%: 0.6 % (ref 0.0–2.0)
EOS%: 1.4 % (ref 0.0–7.0)
Eosinophils Absolute: 0.1 10*3/uL (ref 0.0–0.5)
HCT: 39 % (ref 34.8–46.6)
HGB: 12.3 g/dL (ref 11.6–15.9)
LYMPH#: 1 10*3/uL (ref 0.9–3.3)
LYMPH%: 14.2 % (ref 14.0–48.0)
MCH: 28.8 pg (ref 26.0–34.0)
MCHC: 31.5 g/dL — ABNORMAL LOW (ref 32.0–36.0)
MCV: 91 fL (ref 81–101)
MONO#: 0.8 10*3/uL (ref 0.1–0.9)
MONO%: 10.5 % (ref 0.0–13.0)
NEUT#: 5.3 10*3/uL (ref 1.5–6.5)
NEUT%: 73.3 % (ref 39.6–80.0)
Platelets: 208 10*3/uL (ref 145–400)
RBC: 4.27 10*6/uL (ref 3.70–5.32)
RDW: 14.4 % (ref 11.1–15.7)
WBC: 7.3 10*3/uL (ref 3.9–10.0)

## 2013-04-06 LAB — CMP (CANCER CENTER ONLY)
ALT(SGPT): 33 U/L (ref 10–47)
AST: 51 U/L — ABNORMAL HIGH (ref 11–38)
Albumin: 3.3 g/dL (ref 3.3–5.5)
Alkaline Phosphatase: 69 U/L (ref 26–84)
BUN, Bld: 9 mg/dL (ref 7–22)
CO2: 31 mEq/L (ref 18–33)
Calcium: 9.1 mg/dL (ref 8.0–10.3)
Chloride: 105 mEq/L (ref 98–108)
Creat: 0.7 mg/dl (ref 0.6–1.2)
Glucose, Bld: 94 mg/dL (ref 73–118)
Potassium: 4 mEq/L (ref 3.3–4.7)
Sodium: 138 mEq/L (ref 128–145)
Total Bilirubin: 1.4 mg/dl (ref 0.20–1.60)
Total Protein: 6.9 g/dL (ref 6.4–8.1)

## 2013-04-06 LAB — CHCC SATELLITE - SMEAR

## 2013-04-06 NOTE — Progress Notes (Signed)
Seen by Dr. Marin Olp, no phlebotomy indicated, Hgb 12.3.

## 2013-04-07 LAB — FERRITIN CHCC: Ferritin: 20 ng/ml (ref 9–269)

## 2013-04-07 LAB — IRON AND TIBC CHCC
%SAT: 12 % — ABNORMAL LOW (ref 21–57)
Iron: 50 ug/dL (ref 41–142)
TIBC: 408 ug/dL (ref 236–444)
UIBC: 357 ug/dL (ref 120–384)

## 2013-04-07 NOTE — Progress Notes (Signed)
Hematology and Oncology Follow Up Visit  [name] [id number] [birth year] 68 y.o. 04/07/2013   Principle Diagnosis:  1. 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 2. Hemochromatosis (H63D heterozygote mutation). 3. Asthma. 4. Mitochondrial myopathy.  Current Therapy:   1. Phlebotomy to maintain hematocrit below 42%. 2. Aspirin 81 mg p.o. daily.     Interim History:  [name] is comes in for a followup. She is not doing all that well for what she says. She still is having a lot of breathing issues. She does have underlying lung disease. She's been followed by pulmonary medicine. She is on an inhaler. She would doesn't think that inhaler does help her that much. She says that when she was on Spiriva is working a lot better for her. She gets out of breath very easily. She says she can't you walk up a flight of stairs.  We have been phlebotomizing her. We are keeping her iron deficient. Her last ferritin was 22 with an iron saturation of 10%.  She has a mitochondrial myopathy. I much sure how much of a factor this plays.  She's had no fever. She's had a cough but it is nonproductive. She's had no bleeding. There's been no change in bowel or bladder habits. She's had no swelling in the legs. She's had no rashes. Medications: Current outpatient prescriptions:aspirin 81 MG tablet, Take 81 mg by mouth daily., Disp: , Rfl: ;  beclomethasone (QVAR) 80 MCG/ACT inhaler, Inhale 2 puffs into the lungs 2 (two) times daily., Disp: , Rfl: ;  hydrocortisone valerate cream (WESTCORT) 0.2 %, Apply 1 application topically as needed. , Disp: , Rfl: ;  LORazepam (ATIVAN) 0.5 MG tablet, Take 0.5 mg by mouth at bedtime., Disp: , Rfl:  progesterone (PROMETRIUM) 100 MG capsule, Take 100 mg by mouth daily. , Disp: , Rfl:   Allergies: No Known Allergies  Past Medical History, Surgical history, Social history, and Family History were reviewed and updated.  Review of Systems: As above  Physical Exam:  height is  5\' 3"  (1.6 m) and weight is 133 lb (60.328 kg). Her oral temperature is 97.8 F (36.6 C). Her blood pressure is 126/56 and her pulse is 69. Her respiration is 14.   Petite white female. She is in some mild distress. She just doesn't feel well. Her head and neck exam shows no ocular or oral lesions. No adenopathy noted in the neck. Lungs show decreased breath sounds throughout both lung fields. She has some scattered wheezes bilaterally. Some crackles are noted. Cardiac exam somewhat tachycardic but regular. No murmurs are noted. Abdomen is soft. Good bowel sounds. There is no fluid wave. There is no palpable hepato- splenomegaly. Exam shows some slight kyphosis. Extremities shows age-related arthritic changes. She has some muscle atrophy in upper and lower extremities. She has 4/5 strength. Skin exam no rashes. Neurological exam no focal neurological deficits.  Lab Results  Component Value Date   WBC 7.3 04/06/2013   HGB 12.3 04/06/2013   HCT 39.0 04/06/2013   MCV 91 04/06/2013   PLT 208 04/06/2013     Chemistry      Component Value Date/Time   NA 138 04/06/2013 1127   NA 142 02/06/2013 1040   K 4.0 04/06/2013 1127   K 4.0 02/06/2013 1040   CL 105 04/06/2013 1127   CL 102 02/06/2013 1040   CO2 31 04/06/2013 1127   CO2 24 02/06/2013 1040   BUN 9 04/06/2013 1127   BUN 8 02/06/2013 1040   CREATININE  0.7 04/06/2013 1127   CREATININE 0.68 02/06/2013 1040      Component Value Date/Time   CALCIUM 9.1 04/06/2013 1127   CALCIUM 9.0 02/06/2013 1040   ALKPHOS 69 04/06/2013 1127   ALKPHOS 87 02/06/2013 1040   AST 51* 04/06/2013 1127   AST 44* 02/06/2013 1040   ALT 33 04/06/2013 1127   ALT 34 02/06/2013 1040   BILITOT 1.40 04/06/2013 1127   BILITOT 0.8 02/06/2013 1040      We did walk around the office. At rest, her oxygen saturation was 96%. However, with exertion her oxygen level went down to 87%. Her heart rate went up to 140 but was regular.   Impression and Plan: [name] is a 68 year old white female.  She has multiple medical problems. I will not phlebotomize her today. She certainly does not need to have iron taken out. She does not need to have her hematocrit lowered any.  It seems as if her main problem is her lungs. This is where she is related syndromatic. We exercised for in the office and she did drop her oxygen level significantly.  We will see by getting some oxygen for her at home. I think that this may help her feel a little better.  I spent close to 45 minutes with her today. I'll try to help her out as much as I could with her pulmonary issues. It's hard for her to get to different doctors offices so I thought I would try to help out as much as I could.  I want to see her back in another month.   [name], MD 3/13/201510:36 AM

## 2013-04-10 ENCOUNTER — Encounter: Payer: Self-pay | Admitting: *Deleted

## 2013-04-10 NOTE — Progress Notes (Signed)
Pt left message that she does not think she needs the oxygen now because she is breathing better.  Tried to call pt back but did not get a response.  Left message for her to call us back if she has any further questions.

## 2013-04-11 ENCOUNTER — Telehealth: Payer: Self-pay | Admitting: *Deleted

## 2013-04-11 NOTE — Telephone Encounter (Signed)
Received call from patient that she does not want to proceed with home O2 at this time.  Is doing fine with the Spiriva.  Message passed along to Dr. Marin Olp

## 2013-04-20 DIAGNOSIS — H2510 Age-related nuclear cataract, unspecified eye: Secondary | ICD-10-CM | POA: Diagnosis not present

## 2013-04-20 DIAGNOSIS — Z8679 Personal history of other diseases of the circulatory system: Secondary | ICD-10-CM | POA: Diagnosis not present

## 2013-04-20 DIAGNOSIS — D3130 Benign neoplasm of unspecified choroid: Secondary | ICD-10-CM | POA: Diagnosis not present

## 2013-04-20 DIAGNOSIS — H43819 Vitreous degeneration, unspecified eye: Secondary | ICD-10-CM | POA: Diagnosis not present

## 2013-05-01 ENCOUNTER — Other Ambulatory Visit (HOSPITAL_BASED_OUTPATIENT_CLINIC_OR_DEPARTMENT_OTHER): Payer: Medicare Other | Admitting: Lab

## 2013-05-01 ENCOUNTER — Ambulatory Visit: Payer: Medicare Other

## 2013-05-01 ENCOUNTER — Ambulatory Visit (HOSPITAL_BASED_OUTPATIENT_CLINIC_OR_DEPARTMENT_OTHER): Payer: Medicare Other | Admitting: Hematology & Oncology

## 2013-05-01 ENCOUNTER — Encounter: Payer: Self-pay | Admitting: Hematology & Oncology

## 2013-05-01 ENCOUNTER — Other Ambulatory Visit: Payer: Self-pay | Admitting: *Deleted

## 2013-05-01 VITALS — BP 100/64 | HR 97 | Temp 97.0°F | Resp 12 | Ht 63.0 in | Wt 132.0 lb

## 2013-05-01 DIAGNOSIS — D510 Vitamin B12 deficiency anemia due to intrinsic factor deficiency: Secondary | ICD-10-CM | POA: Diagnosis not present

## 2013-05-01 DIAGNOSIS — D45 Polycythemia vera: Secondary | ICD-10-CM

## 2013-05-01 DIAGNOSIS — G729 Myopathy, unspecified: Secondary | ICD-10-CM | POA: Diagnosis not present

## 2013-05-01 DIAGNOSIS — J441 Chronic obstructive pulmonary disease with (acute) exacerbation: Secondary | ICD-10-CM

## 2013-05-01 DIAGNOSIS — J449 Chronic obstructive pulmonary disease, unspecified: Secondary | ICD-10-CM

## 2013-05-01 LAB — CBC WITH DIFFERENTIAL (CANCER CENTER ONLY)
BASO#: 0 10*3/uL (ref 0.0–0.2)
BASO%: 0.5 % (ref 0.0–2.0)
EOS%: 1.5 % (ref 0.0–7.0)
Eosinophils Absolute: 0.1 10*3/uL (ref 0.0–0.5)
HCT: 42.6 % (ref 34.8–46.6)
HGB: 13.8 g/dL (ref 11.6–15.9)
LYMPH#: 1.3 10*3/uL (ref 0.9–3.3)
LYMPH%: 15.9 % (ref 14.0–48.0)
MCH: 28.6 pg (ref 26.0–34.0)
MCHC: 32.4 g/dL (ref 32.0–36.0)
MCV: 88 fL (ref 81–101)
MONO#: 0.6 10*3/uL (ref 0.1–0.9)
MONO%: 7.5 % (ref 0.0–13.0)
NEUT#: 6.1 10*3/uL (ref 1.5–6.5)
NEUT%: 74.6 % (ref 39.6–80.0)
Platelets: 223 10*3/uL (ref 145–400)
RBC: 4.83 10*6/uL (ref 3.70–5.32)
RDW: 15.6 % (ref 11.1–15.7)
WBC: 8.1 10*3/uL (ref 3.9–10.0)

## 2013-05-01 LAB — CMP (CANCER CENTER ONLY)
ALT(SGPT): 42 U/L (ref 10–47)
AST: 59 U/L — ABNORMAL HIGH (ref 11–38)
Albumin: 3.6 g/dL (ref 3.3–5.5)
Alkaline Phosphatase: 67 U/L (ref 26–84)
BUN, Bld: 15 mg/dL (ref 7–22)
CO2: 27 mEq/L (ref 18–33)
Calcium: 9 mg/dL (ref 8.0–10.3)
Chloride: 102 mEq/L (ref 98–108)
Creat: 0.5 mg/dl — ABNORMAL LOW (ref 0.6–1.2)
Glucose, Bld: 98 mg/dL (ref 73–118)
Potassium: 4.3 mEq/L (ref 3.3–4.7)
Sodium: 143 mEq/L (ref 128–145)
Total Bilirubin: 1.3 mg/dl (ref 0.20–1.60)
Total Protein: 7.8 g/dL (ref 6.4–8.1)

## 2013-05-01 LAB — FERRITIN CHCC: Ferritin: 23 ng/ml (ref 9–269)

## 2013-05-01 LAB — PREALBUMIN: Prealbumin: 25.6 mg/dL (ref 17.0–34.0)

## 2013-05-01 LAB — CHCC SATELLITE - SMEAR

## 2013-05-01 LAB — IRON AND TIBC CHCC
%SAT: 14 % — ABNORMAL LOW (ref 21–57)
Iron: 59 ug/dL (ref 41–142)
TIBC: 430 ug/dL (ref 236–444)
UIBC: 371 ug/dL (ref 120–384)

## 2013-05-01 NOTE — Progress Notes (Signed)
[name] presents today for phlebotomy per MD orders. Phlebotomy procedure started at 1240 and ended at 1256. 547mL removed. Patient observed for 30 minutes after procedure without any incident. Patient tolerated procedure well. IV needle removed intact.

## 2013-05-01 NOTE — Patient Instructions (Signed)

## 2013-05-01 NOTE — Progress Notes (Signed)
[name] presents today for phlebotomy per MD orders. Phlebotomy procedure started at 1240 and ended at 1256. 500mL removed. Patient observed for 30 minutes after procedure without any incident. Patient tolerated procedure well. IV needle removed intact.    

## 2013-05-02 NOTE — Progress Notes (Signed)
Hematology and Oncology Follow Up Visit  KAYRA CROWELL [id number] 1945/09/05 68 y.o. 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 2. Hemochromatosis (H63D heterozygote mutation). 3. Asthma. 4. Mitochondrial myopathy.  Current Therapy:   Phlebotomy to maintain hematocrit below 42%. 2. Aspirin 81 mg p.o. daily.     Interim History:  Ms.  Craker is in for followup. She's doing better no more last saw her. Her breathing seems be doing better. She is on Spiriva inhaler which she thinks helps her out quite a bit.  She has not any oxygen at home. She does feel a bit tired. She thinks that she is going to be phlebotomized.  She's had no problems with cough. There has been no fever. There's been no headache. Patient has this mitochondrial myopathy. This is not causing any kind of flareups.  She has no rashes. She's had no change in bowel or bladder habits.  Medications: Current outpatient prescriptions:aspirin 81 MG tablet, Take 81 mg by mouth daily., Disp: , Rfl: ;  beclomethasone (QVAR) 80 MCG/ACT inhaler, Inhale 2 puffs into the lungs 2 (two) times daily., Disp: , Rfl: ;  hydrocortisone valerate cream (WESTCORT) 0.2 %, Apply 1 application topically as needed. , Disp: , Rfl: ;  LORazepam (ATIVAN) 0.5 MG tablet, Take 0.5 mg by mouth at bedtime., Disp: , Rfl:  progesterone (PROMETRIUM) 100 MG capsule, Take 100 mg by mouth daily. , Disp: , Rfl: ;  tiotropium (SPIRIVA HANDIHALER) 18 MCG inhalation capsule, Place 18 mcg into inhaler and inhale 2 (two) times daily., Disp: , Rfl: ;  Wheat Dextrin (CLEAR SOLUBLE FIBER) POWD, Take by mouth every morning., Disp: , Rfl:   Allergies: No Known Allergies  Past Medical History, Surgical history, Social history, and Family History were reviewed and updated.  Review of Systems: As above  Physical Exam:  height is 5\' 3"  (1.6 m) and weight is 132 lb (59.875 kg). Her oral temperature is 97 F (36.1 C). Her blood pressure is 100/64  and her pulse is 97. Her respiration is 12.   Petite white female. Head and exam shows no scleral icterus. There is no oral lesion. She has no adenopathy in the neck. Lungs are clear. Cardiac exam regular in rhythm. Abdomen soft. Good bowel sounds. There is no fluid wave. There is no palpable liver spleen to. I exam shows supply kyphosis. Extremities shows some age-related Shon Hale for changes. She has applied most likely an upper Lorca on his. Skin exam no rashes. Neurological exam no focal deficits.  Lab Results  Component Value Date   WBC 8.1 05/01/2013   HGB 13.8 05/01/2013   HCT 42.6 05/01/2013   MCV 88 05/01/2013   PLT 223 05/01/2013     Chemistry      Component Value Date/Time   NA 143 05/01/2013 1107   NA 142 02/06/2013 1040   K 4.3 05/01/2013 1107   K 4.0 02/06/2013 1040   CL 102 05/01/2013 1107   CL 102 02/06/2013 1040   CO2 27 05/01/2013 1107   CO2 24 02/06/2013 1040   BUN 15 05/01/2013 1107   BUN 8 02/06/2013 1040   CREATININE 0.5* 05/01/2013 1107   CREATININE 0.68 02/06/2013 1040      Component Value Date/Time   CALCIUM 9.0 05/01/2013 1107   CALCIUM 9.0 02/06/2013 1040   ALKPHOS 67 05/01/2013 1107   ALKPHOS 87 02/06/2013 1040   AST 59* 05/01/2013 1107   AST 44* 02/06/2013 1040   ALT 42 05/01/2013 1107  ALT 34 02/06/2013 1040   BILITOT 1.30 05/01/2013 1107   BILITOT 0.8 02/06/2013 1040         Impression and Plan: Ms. Chowning is 68 year old white female. She has multiple medical problems. We will go ahead and phlebotomize her today. We did not phlebotomized last time because of her pulmonary issues.  Is glad that her lungs are doing a little bit better. She has significant underlying lung disease. She's been followed by pulmonary medicine.  She and her husband going to the [employer]. Hopefully there will be a bit enjoy themselves.  We will plan to get her back to see [place] in another month.   [name], MD 4/7/20156:50 AM

## 2013-05-03 ENCOUNTER — Ambulatory Visit: Payer: Medicare Other | Admitting: Internal Medicine

## 2013-05-08 ENCOUNTER — Telehealth: Payer: Self-pay | Admitting: Pulmonary Disease

## 2013-05-08 NOTE — Telephone Encounter (Signed)
Sample is up front for pick up. Pt is aware. Nothing further was needed.

## 2013-05-13 ENCOUNTER — Ambulatory Visit (INDEPENDENT_AMBULATORY_CARE_PROVIDER_SITE_OTHER): Payer: Medicare Other | Admitting: Family Medicine

## 2013-05-13 VITALS — BP 118/58 | HR 88 | Temp 97.8°F | Resp 16 | Ht 61.5 in | Wt 131.8 lb

## 2013-05-13 DIAGNOSIS — L0291 Cutaneous abscess, unspecified: Secondary | ICD-10-CM | POA: Diagnosis not present

## 2013-05-13 DIAGNOSIS — L0390 Cellulitis, unspecified: Secondary | ICD-10-CM

## 2013-05-13 DIAGNOSIS — T148 Other injury of unspecified body region: Secondary | ICD-10-CM

## 2013-05-13 DIAGNOSIS — W57XXXA Bitten or stung by nonvenomous insect and other nonvenomous arthropods, initial encounter: Secondary | ICD-10-CM

## 2013-05-13 MED ORDER — DOXYCYCLINE HYCLATE 100 MG PO TABS: 100.0000 mg | ORAL_TABLET | Freq: Two times a day (BID) | ORAL | Status: DC

## 2013-05-13 NOTE — Progress Notes (Signed)
Chief Complaint:  Chief Complaint  Patient presents with  . Insect Bite    on Wednesday; applied ice; swelling and itching since Thursday night     HPI: [name] is a 68 y.o. female who is here for  Right middle finger swelling and tenderness after being bitten by ? Wasp 4 days ago. She started having itching and some swelling, put ice on it and also has tried otc hydrocortisone without relief . Today is has gotten more swollen with some redness and warmth, also has tenderness to palpation. She still has full ROM. She denies any fevers or chills, SOB or CP. Today she states she is 10 x worse than when she first got bit.   She has chronic obstructive asthma and is on Qvar by Dr Gwenette Greet and tells me that her oncologist who is seeing her for hemachromatosis also rx her spiriva and the spiriva has helped a lot.   Past Medical History  Diagnosis Date  . Pericarditis     age 17  . Pneumothorax   . Anxiety   . Iliotibial band syndrome     left knee  . Vitamin D deficiency   . Migraine   . Asthma   . Anemia   . IBS (irritable bowel syndrome)   . Hemorrhoids   . Cholelithiasis   . Mitochondrial myopathy   . Heart attack   . Osteoporosis   . Allergy   . Blood transfusion without reported diagnosis   . Polycythemia vera(238.4) 06/17/2012   Past Surgical History  Procedure Laterality Date  . Appendectomy    . Cesarean section      x 4  . Nasal sinus surgery    . Vein surgery    . Cardiac catheterization     History   Social History  . Marital Status: Married    Spouse Name: Richard    Number of Children: 2  . Years of Education: Grad   Occupational History  . Psychologist, educational    Social History Main Topics  . Smoking status: Never Smoker   . Smokeless tobacco: Never Used     Comment: never used tobacco  . Alcohol Use: 1.2 oz/week    2 Glasses of wine per week     Comment: occasionally  . Drug Use: No  . Sexual Activity: Not on file   Other Topics  Concern  . Not on file   Social History Narrative   Health Care POA:    Emergency Contact: husband, Francene Finders, (c) [phone]   End of Life Plan:    Who lives with you: husband   Any pets: none   Diet: Pt has a varied diet.  Eats 5 sm. meals throughout day, focuses on protein, doesn't care for fruits and vegetables very much.   Exercise: Pt has a personal training and exercises several times a week.   Seatbelts: Pt reports wearing seatbelt when in vehicle.   Nancy Fetter Exposure/Protection: Pt reports wearing sun protection.    Hobbies: reading, visiting with friends   Patient has a Scientist, water quality.   Patient has two children.   Patient is retired.   Patient does not drink any caffeine.   Patient is right handed.            Family History  Problem Relation Age of Onset  . Asthma Mother   . Arthritis Mother   . Depression Mother   . Cancer Father   . Emphysema Father   .  Stroke Father   . Alcohol abuse Father   . Heart attack Father   . Colon cancer Neg Hx    No Known Allergies Prior to Admission medications   Medication Sig Start Date End Date Taking? Authorizing Provider  aspirin 81 MG tablet Take 81 mg by mouth daily.   Yes Historical Provider, MD  hydrocortisone valerate cream (WESTCORT) 0.2 % Apply 1 application topically as needed.  07/27/11  Yes Historical Provider, MD  LORazepam (ATIVAN) 0.5 MG tablet Take 0.5 mg by mouth at bedtime. 10/26/12  Yes Leandrew Koyanagi, MD  progesterone (PROMETRIUM) 100 MG capsule Take 100 mg by mouth daily.    Yes Historical Provider, MD  tiotropium (SPIRIVA HANDIHALER) 18 MCG inhalation capsule Place 18 mcg into inhaler and inhale 2 (two) times daily.   Yes Historical Provider, MD  beclomethasone (QVAR) 80 MCG/ACT inhaler Inhale 2 puffs into the lungs 2 (two) times daily.    Historical Provider, MD  Wheat Dextrin (CLEAR SOLUBLE FIBER) POWD Take by mouth every morning.    Historical Provider, MD     ROS: The patient denies fevers, chills,  night sweats, unintentional weight loss, chest pain, palpitations, wheezing, dyspnea on exertion, nausea, vomiting, abdominal pain, dysuria, hematuria, melena, numbness, weakness, or tingling.   All other systems have been reviewed and were otherwise negative with the exception of those mentioned in the HPI and as above.    PHYSICAL EXAM: Filed Vitals:   05/13/13 0922  BP: 118/58  Pulse: 88  Temp: 97.8 F (36.6 C)  Resp: 16   Filed Vitals:   05/13/13 0922  Height: 5' 1.5" (1.562 m)  Weight: 131 lb 12.8 oz (59.784 kg)   Body mass index is 24.5 kg/(m^2).  General: Alert, no acute distress HEENT:  Normocephalic, atraumatic, oropharynx patent. EOMI, PERRLA Cardiovascular:  Regular rate and rhythm, no rubs murmurs or gallops.  No Carotid bruits, radial pulse intact.  Respiratory: Clear to auscultation bilaterally.  No wheezes, rales, or rhonchi.  No cyanosis, no use of accessory musculature GI: No organomegaly, abdomen is soft and non-tender, positive bowel sounds.  No masses. Skin: + right middle finger swelling, tenderness to palpation, and minimal warmth. + radial pulse, full ROM and 5/5 strength of finger Neurologic: Facial musculature symmetric. Psychiatric: Patient is appropriate throughout our interaction. Lymphatic: No cervical lymphadenopathy Musculoskeletal: Gait intact.   LABS: Results for orders placed in visit on 05/01/13  CBC WITH DIFFERENTIAL ([facility])      Result Value Ref Range   WBC 8.1  3.9 - 10.0 10e3/uL   RBC 4.83  3.70 - 5.32 10e6/uL   HGB 13.8  11.6 - 15.9 g/dL   HCT 42.6  34.8 - 46.6 %   MCV 88  81 - 101 fL   MCH 28.6  26.0 - 34.0 pg   MCHC 32.4  32.0 - 36.0 g/dL   RDW 15.6  11.1 - 15.7 %   Platelets 223  145 - 400 10e3/uL   NEUT# 6.1  1.5 - 6.5 10e3/uL   LYMPH# 1.3  0.9 - 3.3 10e3/uL   MONO# 0.6  0.1 - 0.9 10e3/uL   Eosinophils Absolute 0.1  0.0 - 0.5 10e3/uL   BASO# 0.0  0.0 - 0.2 10e3/uL   NEUT% 74.6  39.6 - 80.0 %   LYMPH% 15.9  14.0 -  48.0 %   MONO% 7.5  0.0 - 13.0 %   EOS% 1.5  0.0 - 7.0 %   BASO% 0.5  0.0 -  2.0 %  PREALBUMIN      Result Value Ref Range   Prealbumin 25.6  17.0 - 34.0 mg/dL  IRON AND TIBC [facility]      Result Value Ref Range   Iron 59  41 - 142 ug/dL   TIBC 430  236 - 444 ug/dL   UIBC 371  120 - 384 ug/dL   %SAT 14 (*) 21 - 57 %  FERRITIN [facility]      Result Value Ref Range   Ferritin 23  9 - 269 ng/ml  [facility] - SMEAR      Result Value Ref Range   Smear Result Smear Available    COMPREHENSIVE METABOLIC PANEL ([facility] REFLEX ONLY)      Result Value Ref Range   Sodium 143  128 - 145 mEq/L   Potassium 4.3  3.3 - 4.7 mEq/L   Chloride 102  98 - 108 mEq/L   CO2 27  18 - 33 mEq/L   Glucose, Bld 98  73 - 118 mg/dL   BUN, Bld 15  7 - 22 mg/dL   Creat 0.5 (*) 0.6 - 1.2 mg/dl   Total Bilirubin 1.30  0.20 - 1.60 mg/dl   Alkaline Phosphatase 67  26 - 84 U/L   AST 59 (*) 11 - 38 U/L   ALT(SGPT) 42  10 - 47 U/L   Total Protein 7.8  6.4 - 8.1 g/dL   Albumin 3.6  3.3 - 5.5 g/dL   Calcium 9.0  8.0 - 10.3 mg/dL     EKG/XRAY:   Primary read interpreted by Dr. Marin Comment at [facility].   ASSESSMENT/PLAN: Encounter Diagnoses  Name Primary?  . Cellulitis   . Insect bite Yes   OTC antihistamine spray or PO depends on if she can tolerate dryness She is hesitant to use abx so recommended that she try anithistaime first to see if local reaction just due to hisitamine release If no improvement or worsening sxs then use Doxycycline Rx Doxycycline 100 mg BID x 10 days given Advise to take probiotics F/u prn    Gross sideeffects, risk and benefits, and alternatives of medications d/w patient. Patient is aware that all medications have potential sideeffects and we are unable to predict every sideeffect or drug-drug interaction that may occur.  Glenford Bayley, DO 05/13/2013 11:03 AM

## 2013-05-13 NOTE — Patient Instructions (Signed)
Cellulitis Cellulitis is an infection of the skin and the tissue beneath it. The infected area is usually red and tender. Cellulitis occurs most often in the arms and lower legs.  CAUSES  Cellulitis is caused by bacteria that enter the skin through cracks or cuts in the skin. The most common types of bacteria that cause cellulitis are Staphylococcus and Streptococcus. SYMPTOMS   Redness and warmth.  Swelling.  Tenderness or pain.  Fever. DIAGNOSIS  Your caregiver can usually determine what is wrong based on a physical exam. Blood tests may also be done. TREATMENT  Treatment usually involves taking an antibiotic medicine. HOME CARE INSTRUCTIONS   Take your antibiotics as directed. Finish them even if you start to feel better.  Keep the infected arm or leg elevated to reduce swelling.  Apply a warm cloth to the affected area up to 4 times per day to relieve pain.  Only take over-the-counter or prescription medicines for pain, discomfort, or fever as directed by your caregiver.  Keep all follow-up appointments as directed by your caregiver. SEEK MEDICAL CARE IF:   You notice red streaks coming from the infected area.  Your red area gets larger or turns dark in color.  Your bone or joint underneath the infected area becomes painful after the skin has healed.  Your infection returns in the same area or another area.  You notice a swollen bump in the infected area.  You develop new symptoms. SEEK IMMEDIATE MEDICAL CARE IF:   You have a fever.  You feel very sleepy.  You develop vomiting or diarrhea.  You have a general ill feeling (malaise) with muscle aches and pains. MAKE SURE YOU:   Understand these instructions.  Will watch your condition.  Will get help right away if you are not doing well or get worse. Document Released: 10/22/2004 Document Revised: 07/14/2011 Document Reviewed: 03/30/2011 Exit[employer] Patient Information 2014 [employer].  

## 2013-05-31 ENCOUNTER — Ambulatory Visit (INDEPENDENT_AMBULATORY_CARE_PROVIDER_SITE_OTHER): Payer: Medicare Other | Admitting: Internal Medicine

## 2013-05-31 VITALS — BP 111/70 | HR 102 | Temp 97.0°F | Resp 16 | Ht 61.5 in | Wt 131.0 lb

## 2013-05-31 DIAGNOSIS — J449 Chronic obstructive pulmonary disease, unspecified: Secondary | ICD-10-CM

## 2013-05-31 DIAGNOSIS — R0989 Other specified symptoms and signs involving the circulatory and respiratory systems: Secondary | ICD-10-CM

## 2013-05-31 DIAGNOSIS — F411 Generalized anxiety disorder: Secondary | ICD-10-CM | POA: Diagnosis not present

## 2013-05-31 DIAGNOSIS — R0609 Other forms of dyspnea: Secondary | ICD-10-CM | POA: Diagnosis not present

## 2013-05-31 DIAGNOSIS — G729 Myopathy, unspecified: Secondary | ICD-10-CM | POA: Diagnosis not present

## 2013-05-31 MED ORDER — LORAZEPAM 0.5 MG PO TABS: 0.5000 mg | ORAL_TABLET | Freq: Every day | ORAL | Status: DC

## 2013-05-31 NOTE — Progress Notes (Addendum)
Subjective:  Patient ID: [name], female    DOB: 06-Nov-1945, 68 y.o.   MRN: [id number] This chart was scribed for [place]. Laney Pastor, MD by Terressa Koyanagi, ED Scribe. This patient was seen in room 22 and the patient's care was started at 1:45 PM  Chief Complaint  Patient presents with   Medication Refill                                                                                               HPI  HPI Comments: [name] is a 68 y.o. female who presents to the [facility] for several matters.  Medication Refill: Pt requests refill of her Ativan.     Bruising in Left Lower Leg: Pt complains of scaling and bruising of her lower, left extremity. Pt reports she hit her lower, left leg approximately 3 weeks ago and the subsequent bruising and scaling of the area has not resolved.   Dyspnea: Pt reports she has been to Dr. Gwenette Greet (Pulmonary) who diagnosed pt with asthma and prescribed Qvar and advised pt to continue using Spiriva Handihaler. Pt is currently taking Qvar (2x a day) and Spiriva Handihaler (1x a day). Pt reports the Spiriva Handihaler has helped her tremendously. Pt reports that for years she has had trouble breathing, aggravated by climbing stairs. Pt reports that she had a test done recently which measured her O2 levels at rest and while climbing stairs. Pt reports that at rest her O2 was 98% and while climbing the stairs her O2 was at 100% although she felt like she could not breathe/catch her breath.   Chest Pain: Pt reports she has intermittent chest pain. Note complete cardiac eval wnl--past hx pericarditis so ekg looks abnormal  Hemochromatosis: Pt reports that Dr. Marin Olp recently changed her prior diagnosis of Polycythemia to this. F/u 5/7  Back Brace: Pt reports she is wearing a back brace because of intermittent back aches. Pt reports that sitting and standing and doing dishes aggravates her back pain.    Abdominal Pain: Pt complains of intermittent, central, abd pain. Specifically, pt reports that every time she eats a small amount of food pt begins to have a aching pain in her abd that lasts approximately one hour. Specifically, pt describes the pain as a pressure sensation. Pt denies diarrhea. Pt complains of associated constipation that is relieved with use of senokot (pt reports she has been taking senokot everyday).   Review of Systems  Objective:  Physical Exam  Nursing note and vitals reviewed. Constitutional: She is oriented to person, place, and time. She appears well-developed and well-nourished. No distress.  HENT:  Head: Normocephalic and atraumatic.  Eyes: EOM are normal.  Neck: Neck supple.  Cardiovascular: Normal rate.   Pulmonary/Chest: Effort normal. No respiratory distress.  Musculoskeletal: Normal range of motion.  Neurological: She is alert and oriented to person, place, and time.  Skin: Skin is warm and dry.  Psychiatric: She has a normal mood and affect. Her behavior is normal.   BP 111/70   Pulse 102   Temp(Src) 97 F (36.1 C)  Resp 16   Ht 5' 1.5" (1.562 m)   Wt 131 lb (59.421 kg)   BMI 24.35 kg/m2   SpO2 98%  Assessment & Plan:  DIAGNOSTIC STUDIES: Oxygen Saturation is 98% on RA, normal by my interpretation.    COORDINATION OF CARE: 1:51 PM-Discussed treatment plan which includes physical therapy (for pt's back); continuing working out with her trainer and exercising while introducing climbing small steps and yoga/stretches to pt's workout; massages; switching from senokot to miralax;  I have completed the patient encounter in its entirety as documented by the scribe, with editing by me where necessary. Robert P. Laney Pastor, M.D.    Anxiety--w insomnia--ref loraz to use hs  Chronic obstructive asthma, unspecified -she gets benefit from spiriva as well as inh steroids  UNSPECIFIED MYOPATHY(Mitochondrial myopathy of unclear etiology) -if dx now  hemachromatosis we may need to revisit DOE with cardiac echo  DOE (dyspnea on exertion)---to discuss "deconditioning with trainer to see if she can improve the DOE/also yoga type stretching for back which has prior eval Dr Oneida Alar     Meds ordered this encounter  Medications   LORazepam (ATIVAN) 0.5 MG tablet    Sig: Take 1 tablet (0.5 mg total) by mouth at bedtime.    Dispense:  30 tablet    Refill:  5   F/u 6 mo

## 2013-06-01 ENCOUNTER — Other Ambulatory Visit (HOSPITAL_BASED_OUTPATIENT_CLINIC_OR_DEPARTMENT_OTHER): Payer: Medicare Other | Admitting: Lab

## 2013-06-01 ENCOUNTER — Encounter: Payer: Self-pay | Admitting: Hematology & Oncology

## 2013-06-01 ENCOUNTER — Ambulatory Visit (HOSPITAL_BASED_OUTPATIENT_CLINIC_OR_DEPARTMENT_OTHER): Payer: Medicare Other | Admitting: Hematology & Oncology

## 2013-06-01 VITALS — BP 119/60 | HR 85 | Temp 97.9°F | Resp 14 | Ht 61.0 in | Wt 132.0 lb

## 2013-06-01 DIAGNOSIS — M810 Age-related osteoporosis without current pathological fracture: Secondary | ICD-10-CM

## 2013-06-01 DIAGNOSIS — D45 Polycythemia vera: Secondary | ICD-10-CM | POA: Diagnosis not present

## 2013-06-01 DIAGNOSIS — D510 Vitamin B12 deficiency anemia due to intrinsic factor deficiency: Secondary | ICD-10-CM

## 2013-06-01 LAB — CBC WITH DIFFERENTIAL (CANCER CENTER ONLY)
BASO#: 0 10*3/uL (ref 0.0–0.2)
BASO%: 0.4 % (ref 0.0–2.0)
EOS%: 1.2 % (ref 0.0–7.0)
Eosinophils Absolute: 0.1 10*3/uL (ref 0.0–0.5)
HCT: 37.6 % (ref 34.8–46.6)
HGB: 12.2 g/dL (ref 11.6–15.9)
LYMPH#: 1.4 10*3/uL (ref 0.9–3.3)
LYMPH%: 17.2 % (ref 14.0–48.0)
MCH: 28.4 pg (ref 26.0–34.0)
MCHC: 32.4 g/dL (ref 32.0–36.0)
MCV: 88 fL (ref 81–101)
MONO#: 0.8 10*3/uL (ref 0.1–0.9)
MONO%: 8.9 % (ref 0.0–13.0)
NEUT#: 6.1 10*3/uL (ref 1.5–6.5)
NEUT%: 72.3 % (ref 39.6–80.0)
Platelets: 210 10*3/uL (ref 145–400)
RBC: 4.29 10*6/uL (ref 3.70–5.32)
RDW: 15.5 % (ref 11.1–15.7)
WBC: 8.4 10*3/uL (ref 3.9–10.0)

## 2013-06-01 LAB — CMP (CANCER CENTER ONLY)
ALT(SGPT): 34 U/L (ref 10–47)
AST: 48 U/L — ABNORMAL HIGH (ref 11–38)
Albumin: 3.4 g/dL (ref 3.3–5.5)
Alkaline Phosphatase: 65 U/L (ref 26–84)
BUN, Bld: 15 mg/dL (ref 7–22)
CO2: 29 mEq/L (ref 18–33)
Calcium: 9.2 mg/dL (ref 8.0–10.3)
Chloride: 100 mEq/L (ref 98–108)
Creat: 0.6 mg/dl (ref 0.6–1.2)
Glucose, Bld: 100 mg/dL (ref 73–118)
Potassium: 3.2 mEq/L — ABNORMAL LOW (ref 3.3–4.7)
Sodium: 139 mEq/L (ref 128–145)
Total Bilirubin: 1 mg/dl (ref 0.20–1.60)
Total Protein: 7.3 g/dL (ref 6.4–8.1)

## 2013-06-01 LAB — CHCC SATELLITE - SMEAR

## 2013-06-02 LAB — IRON AND TIBC CHCC
%SAT: 11 % — ABNORMAL LOW (ref 21–57)
Iron: 39 ug/dL — ABNORMAL LOW (ref 41–142)
TIBC: 366 ug/dL (ref 236–444)
UIBC: 327 ug/dL (ref 120–384)

## 2013-06-02 LAB — FERRITIN CHCC: Ferritin: 17 ng/ml (ref 9–269)

## 2013-06-02 NOTE — Progress Notes (Signed)
Hematology and Oncology Follow Up Visit  RIN GORTON [id number] April 14, 1945 68 y.o. 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 2. Hemochromatosis (H63D heterozygote mutation). 3. Asthma. 4. Mitochondrial myopathy.  Current Therapy:   Phlebotomy to maintain hematocrit below 42%. 2. Aspirin 81 mg p.o. daily.     Interim History:  Ms.  Dea is back for followup. She's to be well. We did phlebotomize her when she was last here in.  She says that that her breathing is doing better. She is on Spiriva and this does seem to help her.  She still has some fatigue. We are make her iron deficient. We last saw her, her ferritin was 23 with an iron saturation 14%. She saw some back issues. She does have some osteoporosis.  Is no problems with her bowels or bladder. She's had no fever. She's had no bleeding. There's been no swelling.  Medications: Current outpatient prescriptions:aspirin 81 MG tablet, Take 81 mg by mouth daily., Disp: , Rfl: ;  beclomethasone (QVAR) 80 MCG/ACT inhaler, Inhale 2 puffs into the lungs 2 (two) times daily., Disp: , Rfl: ;  hydrocortisone valerate cream (WESTCORT) 0.2 %, Apply 1 application topically as needed. , Disp: , Rfl: ;  LORazepam (ATIVAN) 0.5 MG tablet, Take 1 tablet (0.5 mg total) by mouth at bedtime., Disp: 30 tablet, Rfl: 5 progesterone (PROMETRIUM) 100 MG capsule, Take 100 mg by mouth daily. , Disp: , Rfl: ;  tiotropium (SPIRIVA HANDIHALER) 18 MCG inhalation capsule, Place 18 mcg into inhaler and inhale 2 (two) times daily., Disp: , Rfl: ;  Wheat Dextrin (CLEAR SOLUBLE FIBER) POWD, Take by mouth every morning., Disp: , Rfl:   Allergies: No Known Allergies  Past Medical History, Surgical history, Social history, and Family History were reviewed and updated.  Review of Systems: As above  Physical Exam:  height is 5\' 1"  (1.549 m) and weight is 132 lb (59.875 kg). Her oral temperature is 97.9 F (36.6 C). Her blood pressure is  119/60 and her pulse is 85. Her respiration is 14.   Petite white female in no obvious distress. Head exam shows no ocular or oral lesions. She has no adenopathy in the neck. Lungs are clear and. There may be some slight decrease in the bases. Occasional wheezes noted. Cardiac exam regular in rhythm with no murmurs rubs or bruits. Abdomen is soft. She has good bowel sounds. There is no palpable abdominal mass. Is no palpable liver or spleen tip. I exam shows some slight kyphosis. The some osteoporotic changes. No tenderness is noted over the spine ribs or hips. Extremities shows some muscle atrophy in upper and lower extremities bilaterally. She is decent range of motion. Has good strength. Skin exam no rashes. Neurological exam is nonfocal.  Lab Results  Component Value Date   WBC 8.4 06/01/2013   HGB 12.2 06/01/2013   HCT 37.6 06/01/2013   MCV 88 06/01/2013   PLT 210 06/01/2013     Chemistry      Component Value Date/Time   NA 139 06/01/2013 1401   NA 142 02/06/2013 1040   K 3.2* 06/01/2013 1401   K 4.0 02/06/2013 1040   CL 100 06/01/2013 1401   CL 102 02/06/2013 1040   CO2 29 06/01/2013 1401   CO2 24 02/06/2013 1040   BUN 15 06/01/2013 1401   BUN 8 02/06/2013 1040   CREATININE 0.6 06/01/2013 1401   CREATININE 0.68 02/06/2013 1040      Component Value Date/Time  CALCIUM 9.2 06/01/2013 1401   CALCIUM 9.0 02/06/2013 1040   ALKPHOS 65 06/01/2013 1401   ALKPHOS 87 02/06/2013 1040   AST 48* 06/01/2013 1401   AST 44* 02/06/2013 1040   ALT 34 06/01/2013 1401   ALT 34 02/06/2013 1040   BILITOT 1.00 06/01/2013 1401   BILITOT 0.8 02/06/2013 1040      Ferritin is 17. Iron saturation is 11%.  total iron is 39   Impression and Plan: Ms. Uncapher is  68 year old white female. She is multiple issues. We're going are likely to have any problems with the hemachromatosis given her low iron and low iron saturation. The whole polycythemia is also not a problem. She does not need to be phlebotomized today.  Will plan to get her  back in another 6 weeks. I don't think we have to get her back any sooner less she has issues.  I am just glad that she is feeling a little better.   [name], MD 5/8/20156:39 PM

## 2013-06-08 ENCOUNTER — Telehealth: Payer: Self-pay | Admitting: Pulmonary Disease

## 2013-06-08 MED ORDER — BECLOMETHASONE DIPROPIONATE 80 MCG/ACT IN AERS: 2.0000 | INHALATION_SPRAY | Freq: Two times a day (BID) | RESPIRATORY_TRACT | Status: DC

## 2013-06-08 NOTE — Telephone Encounter (Signed)
Called spoke w/ pt. Aware we do not have any samples. She needed RX sent. i have done so. Nothing further needed

## 2013-06-29 ENCOUNTER — Encounter: Payer: Self-pay | Admitting: Sports Medicine

## 2013-06-29 ENCOUNTER — Ambulatory Visit (INDEPENDENT_AMBULATORY_CARE_PROVIDER_SITE_OTHER): Payer: Medicare Other | Admitting: Sports Medicine

## 2013-06-29 VITALS — BP 112/71 | Ht 62.0 in | Wt 130.0 lb

## 2013-06-29 DIAGNOSIS — M549 Dorsalgia, unspecified: Secondary | ICD-10-CM

## 2013-06-29 NOTE — Progress Notes (Signed)
Patient ID: [name], female   DOB: 13-Oct-1945, 68 y.o.   MRN: [id number] 68 year old female with a history of mitochondrial myopathy, hemochromatosis and COPD/asthma presents with back pain. She had an injury approximately 2 years ago to the fascia just lateral to the sciatic nerve and SI joint region. She's had persistent pain in that region. It is worsened recently. Pain is worse with sitting down. Pain improves slightly with standing up however, she cannot stand for prolonged periods of time secondary to her myopathy. She is no radiation of pain weakness or numbness down her legs. She denies a specific new injury. She finds that her symptoms are relieved when lying flat on her back heart surface. Has not taken any pain medication for this.  Pertinent past medical history: Hemochromatosis, mitochondrial myopathy, COPD/asthma  Social history: Married, nonsmoker  Review of systems: As per history of present illness otherwise all systems negative  Examination: BP 112/71  Ht 5\' 2"  (1.575 m)  Wt 130 lb (58.968 kg)  BMI 23.77 kg/m2 Well-developed well-nourished 68 year old female awake alert and oriented in no acute distress  Back examination: Normal flexion extension, tenderness to palpation just lateral to the SI joint in the lumbar region. No sciatic notch tenderness, strength is 4/5 in all hip musculature bilaterally.  FABER and FADIR testing negative  Negative straight leg raise

## 2013-06-29 NOTE — Assessment & Plan Note (Signed)
She has recurrence of pain in this region today. I suspect this is related to scar tissue. We've encouraged her to continue active range of motion is much as possible. She declined tramadol today. Also advised her that some over-the-counter topical anti-inflammatory medication to help alleviate the symptoms. I would like to see her increase her walking as I feel this will help stretch the fascial region however, 2 to her mitochondrial myopathy she is difficulty walking for a significant period of time.

## 2013-07-14 ENCOUNTER — Ambulatory Visit: Payer: Medicare Other

## 2013-07-14 ENCOUNTER — Ambulatory Visit (HOSPITAL_BASED_OUTPATIENT_CLINIC_OR_DEPARTMENT_OTHER): Payer: Medicare Other | Admitting: Hematology & Oncology

## 2013-07-14 ENCOUNTER — Other Ambulatory Visit (HOSPITAL_BASED_OUTPATIENT_CLINIC_OR_DEPARTMENT_OTHER): Payer: Medicare Other | Admitting: Lab

## 2013-07-14 DIAGNOSIS — D45 Polycythemia vera: Secondary | ICD-10-CM

## 2013-07-14 DIAGNOSIS — D510 Vitamin B12 deficiency anemia due to intrinsic factor deficiency: Secondary | ICD-10-CM

## 2013-07-14 LAB — CMP (CANCER CENTER ONLY)
ALT(SGPT): 30 U/L (ref 10–47)
AST: 38 U/L (ref 11–38)
Albumin: 3.2 g/dL — ABNORMAL LOW (ref 3.3–5.5)
Alkaline Phosphatase: 63 U/L (ref 26–84)
BUN, Bld: 13 mg/dL (ref 7–22)
CO2: 26 mEq/L (ref 18–33)
Calcium: 9.1 mg/dL (ref 8.0–10.3)
Chloride: 101 mEq/L (ref 98–108)
Creat: 0.7 mg/dl (ref 0.6–1.2)
Glucose, Bld: 103 mg/dL (ref 73–118)
Potassium: 3.6 mEq/L (ref 3.3–4.7)
Sodium: 139 mEq/L (ref 128–145)
Total Bilirubin: 1.1 mg/dl (ref 0.20–1.60)
Total Protein: 6.9 g/dL (ref 6.4–8.1)

## 2013-07-14 LAB — CBC WITH DIFFERENTIAL (CANCER CENTER ONLY)
BASO#: 0 10*3/uL (ref 0.0–0.2)
BASO%: 0.6 % (ref 0.0–2.0)
EOS%: 1.7 % (ref 0.0–7.0)
Eosinophils Absolute: 0.1 10*3/uL (ref 0.0–0.5)
HCT: 39.4 % (ref 34.8–46.6)
HGB: 12.6 g/dL (ref 11.6–15.9)
LYMPH#: 1 10*3/uL (ref 0.9–3.3)
LYMPH%: 14.9 % (ref 14.0–48.0)
MCH: 29.1 pg (ref 26.0–34.0)
MCHC: 32 g/dL (ref 32.0–36.0)
MCV: 91 fL (ref 81–101)
MONO#: 0.7 10*3/uL (ref 0.1–0.9)
MONO%: 10.1 % (ref 0.0–13.0)
NEUT#: 5 10*3/uL (ref 1.5–6.5)
NEUT%: 72.7 % (ref 39.6–80.0)
Platelets: 189 10*3/uL (ref 145–400)
RBC: 4.33 10*6/uL (ref 3.70–5.32)
RDW: 16 % — ABNORMAL HIGH (ref 11.1–15.7)
WBC: 6.9 10*3/uL (ref 3.9–10.0)

## 2013-07-14 LAB — IRON AND TIBC CHCC
%SAT: 11 % — ABNORMAL LOW (ref 21–57)
Iron: 43 ug/dL (ref 41–142)
TIBC: 383 ug/dL (ref 236–444)
UIBC: 339 ug/dL (ref 120–384)

## 2013-07-14 LAB — FERRITIN CHCC: Ferritin: 20 ng/ml (ref 9–269)

## 2013-07-14 NOTE — Progress Notes (Signed)
Hematology and Oncology Follow Up Visit  [name] [id number] March 18, 1945 68 y.o. 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 2. Hemochromatosis (H63D heterozygote mutation). 3. Asthma. 4. Mitochondrial myopathy.  Current Therapy:   Phlebotomy to maintain hematocrit below 42%. 2. Aspirin 81 mg p.o. daily.     Interim History:  [name] is back for followup. She's doingquite well. We have not had to phlebotomize her when she was last here in.  She says that that her breathing is doing better. She is on Spiriva and this does seem to help her.  She still has some fatigue. We are make her iron deficient. We last saw her in May, her ferritin was 17 with an iron saturation 11%. She still has some back issues. She does have some osteoporosis.  Is no problems with her bowels or bladder. She's had no fever. She's had no bleeding. There's been no swelling.  Medications: Current outpatient prescriptions:aspirin 81 MG tablet, Take 81 mg by mouth daily., Disp: , Rfl: ;  beclomethasone (QVAR) 80 MCG/ACT inhaler, Inhale 2 puffs into the lungs 2 (two) times daily., Disp: 1 Inhaler, Rfl: 6;  hydrocortisone valerate cream (WESTCORT) 0.2 %, Apply 1 application topically as needed. , Disp: , Rfl: ;  LORazepam (ATIVAN) 0.5 MG tablet, Take 1 tablet (0.5 mg total) by mouth at bedtime., Disp: 30 tablet, Rfl: 5 progesterone (PROMETRIUM) 100 MG capsule, Take 100 mg by mouth daily. , Disp: , Rfl: ;  tiotropium (SPIRIVA HANDIHALER) 18 MCG inhalation capsule, Place 18 mcg into inhaler and inhale 2 (two) times daily., Disp: , Rfl: ;  Wheat Dextrin (CLEAR SOLUBLE FIBER) POWD, Take by mouth every morning., Disp: , Rfl:   Allergies: No Known Allergies  Past Medical History, Surgical history, Social history, and Family History were reviewed and updated.  Review of Systems: As above  Physical Exam:  vitals were not taken for this visit.  Petite white female in no obvious distress.  Head exam shows no ocular or oral lesions. She has no adenopathy in the neck. Lungs are clear and. There may be some slight decrease in the bases. Occasional wheezes noted. Cardiac exam regular in rhythm with no murmurs rubs or bruits. Abdomen is soft. She has good bowel sounds. There is no palpable abdominal mass. Is no palpable liver or spleen tip. I exam shows some slight kyphosis. The some osteoporotic changes. No tenderness is noted over the spine ribs or hips. Extremities shows some muscle atrophy in upper and lower extremities bilaterally. She is decent range of motion. Has good strength. Skin exam no rashes. Neurological exam is nonfocal.  Lab Results  Component Value Date   WBC 6.9 07/14/2013   HGB 12.6 07/14/2013   HCT 39.4 07/14/2013   MCV 91 07/14/2013   PLT 189 07/14/2013     Chemistry      Component Value Date/Time   NA 139 07/14/2013 1107   NA 142 02/06/2013 1040   K 3.6 07/14/2013 1107   K 4.0 02/06/2013 1040   CL 101 07/14/2013 1107   CL 102 02/06/2013 1040   CO2 26 07/14/2013 1107   CO2 24 02/06/2013 1040   BUN 13 07/14/2013 1107   BUN 8 02/06/2013 1040   CREATININE 0.7 07/14/2013 1107   CREATININE 0.68 02/06/2013 1040      Component Value Date/Time   CALCIUM 9.1 07/14/2013 1107   CALCIUM 9.0 02/06/2013 1040   ALKPHOS 63 07/14/2013 1107   ALKPHOS 87 02/06/2013 1040  AST 38 07/14/2013 1107   AST 44* 02/06/2013 1040   ALT 30 07/14/2013 1107   ALT 34 02/06/2013 1040   BILITOT 1.10 07/14/2013 1107   BILITOT 0.8 02/06/2013 1040         Impression and Plan: [name] is  68 year old white female. She is multiple issues. We're going are likely not going to have any problems with the hemachromatosis given her low iron and low iron saturation. The  Polycythemia should also not a problem. She does not need to be phlebotomized today.  Will plan to get her back in another 4 weeks. I don't think we have to get her back any sooner less she has issues.  I am just glad that she is feeling a  little better.  It will be her 68th birthday next month and she is looking forward to this!!!   [name], MD 6/19/20151:36 PM

## 2013-07-14 NOTE — Progress Notes (Signed)
No phlebotomy today per dr. ennever 

## 2013-08-15 ENCOUNTER — Ambulatory Visit (HOSPITAL_BASED_OUTPATIENT_CLINIC_OR_DEPARTMENT_OTHER): Payer: Medicare Other | Admitting: Hematology & Oncology

## 2013-08-15 ENCOUNTER — Ambulatory Visit (HOSPITAL_BASED_OUTPATIENT_CLINIC_OR_DEPARTMENT_OTHER): Payer: Medicare Other

## 2013-08-15 ENCOUNTER — Other Ambulatory Visit (HOSPITAL_BASED_OUTPATIENT_CLINIC_OR_DEPARTMENT_OTHER): Payer: Medicare Other | Admitting: Lab

## 2013-08-15 VITALS — BP 92/65 | HR 84 | Temp 97.3°F | Resp 18

## 2013-08-15 VITALS — BP 113/73 | HR 90 | Temp 97.2°F | Resp 16 | Wt 131.0 lb

## 2013-08-15 DIAGNOSIS — D45 Polycythemia vera: Secondary | ICD-10-CM

## 2013-08-15 DIAGNOSIS — R7989 Other specified abnormal findings of blood chemistry: Secondary | ICD-10-CM | POA: Diagnosis not present

## 2013-08-15 DIAGNOSIS — D510 Vitamin B12 deficiency anemia due to intrinsic factor deficiency: Secondary | ICD-10-CM

## 2013-08-15 LAB — COMPREHENSIVE METABOLIC PANEL
ALT: 22 U/L (ref 0–35)
AST: 26 U/L (ref 0–37)
Albumin: 3.8 g/dL (ref 3.5–5.2)
Alkaline Phosphatase: 60 U/L (ref 39–117)
BUN: 15 mg/dL (ref 6–23)
CO2: 23 mEq/L (ref 19–32)
Calcium: 8.8 mg/dL (ref 8.4–10.5)
Chloride: 104 mEq/L (ref 96–112)
Creatinine, Ser: 0.6 mg/dL (ref 0.50–1.10)
Glucose, Bld: 111 mg/dL — ABNORMAL HIGH (ref 70–99)
Potassium: 4.2 mEq/L (ref 3.5–5.3)
Sodium: 137 mEq/L (ref 135–145)
Total Bilirubin: 1.2 mg/dL (ref 0.2–1.2)
Total Protein: 6.7 g/dL (ref 6.0–8.3)

## 2013-08-15 LAB — CBC WITH DIFFERENTIAL (CANCER CENTER ONLY)
BASO#: 0 10*3/uL (ref 0.0–0.2)
BASO%: 0.6 % (ref 0.0–2.0)
EOS%: 1.5 % (ref 0.0–7.0)
Eosinophils Absolute: 0.1 10*3/uL (ref 0.0–0.5)
HCT: 42.4 % (ref 34.8–46.6)
HGB: 14 g/dL (ref 11.6–15.9)
LYMPH#: 1.2 10*3/uL (ref 0.9–3.3)
LYMPH%: 16.9 % (ref 14.0–48.0)
MCH: 29.4 pg (ref 26.0–34.0)
MCHC: 33 g/dL (ref 32.0–36.0)
MCV: 89 fL (ref 81–101)
MONO#: 0.6 10*3/uL (ref 0.1–0.9)
MONO%: 8.8 % (ref 0.0–13.0)
NEUT#: 5.2 10*3/uL (ref 1.5–6.5)
NEUT%: 72.2 % (ref 39.6–80.0)
Platelets: 182 10*3/uL (ref 145–400)
RBC: 4.76 10*6/uL (ref 3.70–5.32)
RDW: 15.6 % (ref 11.1–15.7)
WBC: 7.2 10*3/uL (ref 3.9–10.0)

## 2013-08-15 LAB — IRON AND TIBC CHCC
%SAT: 17 % — ABNORMAL LOW (ref 21–57)
Iron: 69 ug/dL (ref 41–142)
TIBC: 405 ug/dL (ref 236–444)
UIBC: 336 ug/dL (ref 120–384)

## 2013-08-15 LAB — FERRITIN CHCC: Ferritin: 17 ng/ml (ref 9–269)

## 2013-08-15 NOTE — Patient Instructions (Signed)
Therapeutic Phlebotomy Care After Refer to this sheet in the next few weeks. These instructions provide you with information on caring for yourself after your procedure. Your caregiver may also give you more specific instructions. Your treatment has been planned according to current medical practices, but problems sometimes occur. Call your caregiver if you have any problems or questions after your procedure. HOME CARE INSTRUCTIONS Most people can go back to their normal activities right away. Before you leave, be sure to ask if there is anything you should or should not do. In general, it would be wise to:  Keep the bandage dry. You can remove the bandage after about 5 hours.  Eat well-balanced meals for the next 24 hours.  Drink enough fluids to keep your urine clear or pale yellow.  Avoid drinking alcohol minimally until after eating.  Avoid smoking for at least 30 minutes after the procedure.  Avoid strenous physical activity or heavy lifting or pulling for about 5 hours after the procedure.  Athletes should avoid strenous exercise for 12 hours or more.  Change positions slowly for the remainder of the day to prevent lightheadedness or fainting.  If you feel lightheaded, lie down until the feeling subsides.  If you have bleeding from the needle insertion site, elevate your arm and press firmly on the site until the bleeding stops.  If bruising or bleeding appears under the skin, apply ice to the area for 15 to 20 minutes, 3 to 4 times per day. Put the ice in a plastic bag and place a towel between the bag of ice and your skin. Do this while you are awake for the first 24 hours. The ice packs can be stopped before 24 hours if the swelling goes away. If swelling persists after 24 hours, a warm, moist washcloth can be applied to the area for 15 to 20 minutes, 3 to 4 times per day. The warm, moist treatments can be stopped when the swelling goes away.  It is important to continue further  therapeutic phlebotomy as directed by your caregiver. SEEK MEDICAL CARE IF:  There is bleeding or fluid leaking from the needle insertion site.  The needle insertion site becomes swollen, red, or sore.  You feel lightheaded, dizzy or nauseated, and the feeling does not go away.  You notice new bruising at the needle insertion site.  You feel more weak or tired than normal.  You develop a fever. SEEK IMMEDIATE MEDICAL CARE IF:   There is increased bleeding, pain, or swelling from the needle insertion site.  You have severe nausea or vomiting.  You have chest pain.  You have trouble breathing. MAKE SURE YOU:  Understand these instructions.  Will watch your condition.  Will get help right away if you are not doing well or get worse. Document Released: 06/16/2010 Document Revised: 04/06/2011 Document Reviewed: 06/16/2010 ExitCare Patient Information 2015 [employer]. This information is not intended to replace advice given to you by your health care provider. Make sure you discuss any questions you have with your health care provider.  

## 2013-08-15 NOTE — Progress Notes (Signed)
[name] presents today for phlebotomy per MD orders. Phlebotomy procedure started at 1115 and ended at 1130 500 grams removed. Patient observed for 30 minutes after procedure without any incident. Patient tolerated procedure well. IV needle removed intact.

## 2013-08-17 NOTE — Progress Notes (Signed)
Hematology and Oncology Follow Up Visit  [name] [id number] 18-Aug-1945 68 y.o. 08/17/2013   Principle Diagnosis:  1. 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 2. Hemochromatosis (H63D heterozygote mutation). 3. Asthma. 4. Mitochondrial myopathy.  Current Therapy:   Phlebotomy to maintain hematocrit below 42%. 2. Aspirin 81 mg p.o. daily.     Interim History:  [name] is in for followup. She had a good 68th birthday. [place] She's to appear well. She saw some shortness of breath., Sure what the underlying etiology of this is. I don't know that this might be from the mitochondrial myopathy.  We have not phlebotomized her for a while. Her hemoglobin and hematocrit have been below threshold.  The hemochromatosis has not been a problem. We did check her iron studies today. Her ferritin was only 17. Her R. saturation was 17%.  Her appetite has been okay. She's had no nausea vomiting. She's had no change in bowel habits. She's had no leg swelling.  Medications: Current outpatient prescriptions:aspirin 81 MG tablet, Take 81 mg by mouth daily., Disp: , Rfl: ;  beclomethasone (QVAR) 80 MCG/ACT inhaler, Inhale 2 puffs into the lungs 2 (two) times daily., Disp: 1 Inhaler, Rfl: 6;  hydrocortisone valerate cream (WESTCORT) 0.2 %, Apply 1 application topically as needed. , Disp: , Rfl: ;  LORazepam (ATIVAN) 0.5 MG tablet, Take 1 tablet (0.5 mg total) by mouth at bedtime., Disp: 30 tablet, Rfl: 5 progesterone (PROMETRIUM) 100 MG capsule, Take 100 mg by mouth daily. , Disp: , Rfl: ;  tiotropium (SPIRIVA HANDIHALER) 18 MCG inhalation capsule, Place 18 mcg into inhaler and inhale 2 (two) times daily., Disp: , Rfl: ;  Wheat Dextrin (CLEAR SOLUBLE FIBER) POWD, Take by mouth every morning., Disp: , Rfl:   Allergies: No Known Allergies  Past Medical History, Surgical history, Social history, and Family History were reviewed and updated.  Review of Systems: As  above  Physical Exam:  weight is 131 lb (59.421 kg). Her oral temperature is 97.2 F (36.2 C). Her blood pressure is 113/73 and her pulse is 90. Her respiration is 16.   Well-developed and well-nourished white female in no obvious distress. Head and neck exam shows no ocular or oral lesion. There are no palpable cervical or supraclavicular lymph nodes. Lungs are clear. Cardiac exam regular in rhythm with no murmurs rubs or bruits. Abdomen is soft. She has good bowel sounds. There is no fluid wave. There is no palpable liver or spleen tip. Back exam shows no tenderness over the spine ribs or hips. Extremities shows no clubbing cyanosis or edema. Neurological exam shows no focal neurological deficits.  Lab Results  Component Value Date   WBC 7.2 08/15/2013   HGB 14.0 08/15/2013   HCT 42.4 08/15/2013   MCV 89 08/15/2013   PLT 182 08/15/2013     Chemistry      Component Value Date/Time   NA 137 08/15/2013 0915   NA 139 07/14/2013 1107   K 4.2 08/15/2013 0915   K 3.6 07/14/2013 1107   CL 104 08/15/2013 0915   CL 101 07/14/2013 1107   CO2 23 08/15/2013 0915   CO2 26 07/14/2013 1107   BUN 15 08/15/2013 0915   BUN 13 07/14/2013 1107   CREATININE 0.60 08/15/2013 0915   CREATININE 0.7 07/14/2013 1107      Component Value Date/Time   CALCIUM 8.8 08/15/2013 0915   CALCIUM 9.1 07/14/2013 1107   ALKPHOS 60 08/15/2013 0915  ALKPHOS 63 07/14/2013 1107   AST 26 08/15/2013 0915   AST 38 07/14/2013 1107   ALT 22 08/15/2013 0915   ALT 30 07/14/2013 1107   BILITOT 1.2 08/15/2013 0915   BILITOT 1.10 07/14/2013 1107         Impression and Plan: [name] is a 68 year old female. She has multiple hematologic issues. She has polycythemia. We will go ahead and phlebotomize her today. She does better with a hematocrit below 42%.  She does not have any issues with hemachromatosis. I don't think with her genetic mutation, that she will he will retain iron.  I think we can get her back in 6 weeks. I think this would  be reasonable.   [name], MD 7/23/20156:29 AM

## 2013-08-21 ENCOUNTER — Telehealth: Payer: Self-pay | Admitting: Pulmonary Disease

## 2013-08-21 NOTE — Telephone Encounter (Signed)
Pt needed an appt to see TP. Nothing further needed at this time.

## 2013-08-23 ENCOUNTER — Ambulatory Visit (INDEPENDENT_AMBULATORY_CARE_PROVIDER_SITE_OTHER): Payer: Medicare Other | Admitting: Adult Health

## 2013-08-23 ENCOUNTER — Encounter: Payer: Self-pay | Admitting: Adult Health

## 2013-08-23 VITALS — BP 106/62 | HR 88 | Temp 98.0°F | Ht 62.0 in | Wt 131.6 lb

## 2013-08-23 DIAGNOSIS — J449 Chronic obstructive pulmonary disease, unspecified: Secondary | ICD-10-CM

## 2013-08-23 MED ORDER — ALBUTEROL SULFATE HFA 108 (90 BASE) MCG/ACT IN AERS: 2.0000 | INHALATION_SPRAY | RESPIRATORY_TRACT | Status: DC | PRN

## 2013-08-23 MED ORDER — LEVALBUTEROL HCL 0.63 MG/3ML IN NEBU
0.6300 mg | INHALATION_SOLUTION | Freq: Once | RESPIRATORY_TRACT | Status: AC
  Administered 2013-08-23: 0.63 mg via RESPIRATORY_TRACT

## 2013-08-23 MED ORDER — TIOTROPIUM BROMIDE MONOHYDRATE 18 MCG IN CAPS: 18.0000 ug | ORAL_CAPSULE | Freq: Every day | RESPIRATORY_TRACT | Status: DC

## 2013-08-23 NOTE — Progress Notes (Signed)
   Subjective:    Patient ID: [name], female    DOB: 1945-06-28, 68 y.o.   MRN: [id number]  HPI 68 yo never smoker, hx hemachromatosis, mitochondrial myopathy, polycythemia, followed by Dr Gwenette Greet for allergies and asthma with documented AFL and a BD response. Alpha 1 nml    08/23/2013 Acute OV  Complains of gradual increased SOB x3 weeks with wheezing, tightness.   Pt was started on Spiriva ~12/2012 however was instructed to stop and Use QVAR. Was unable to tolerate Dulera or Striverdi .  She reports she did not stop the Spiriva. She decided to stop 2.5-3wks ago and since then her breathing has declined. Feels more short of breath. We discussed her previous PFT that showed significant airflow obstruction c/w asthma. She says she can not tolerate anything else except for Spiriva . Does have some nasal drainage. No cough .    Denies any cough, congestion, hemoptysis, head conestion, PND, leg swelling. She denies any chest pain, exertional chest pain, syncope, edema, or orthopnea.    Review of Systems Constitutional:   No  weight loss, night sweats,  Fevers, chills, fatigue, or  lassitude.  HEENT:   No headaches,  Difficulty swallowing,  Tooth/dental problems, or  Sore throat,                No sneezing, itching, ear ache,  +nasal congestion, post nasal drip,   CV:  No chest pain,  Orthopnea, PND, swelling in lower extremities, anasarca, dizziness, palpitations, syncope.   GI  No heartburn, indigestion, abdominal pain, nausea, vomiting, diarrhea, change in bowel habits, loss of appetite, bloody stools.   Resp:   No chest wall deformity  Skin: no rash or lesions.  GU: no dysuria, change in color of urine, no urgency or frequency.  No flank pain, no hematuria   MS:  No joint pain or swelling.  No decreased range of motion.  No back pain.  Psych:  No change in mood or affect. No depression or anxiety.  No memory loss.         Objective:   Physical Exam GEN: A/Ox3;  pleasant , NAD, elderly   HEENT:  [place]/AT,  EACs-clear, TMs-wnl, NOSE-clear, THROAT-clear, no lesions, no postnasal drip or exudate noted.   NECK:  Supple w/ fair ROM; no JVD; normal carotid impulses w/o bruits; no thyromegaly or nodules palpated; no lymphadenopathy.  RESP  Faint exp wheeze on forced exp no accessory muscle use, no dullness to percussion  CARD:  RRR, no m/r/g  , no peripheral edema, pulses intact, no cyanosis or clubbing.  GI:   Soft & nt; nml bowel sounds; no organomegaly or masses detected.  Musco: Warm bil, no deformities or joint swelling noted.   Neuro: alert, no focal deficits noted.    Skin: Warm, no lesions or rashes         Assessment & Plan:

## 2013-08-23 NOTE — Assessment & Plan Note (Addendum)
Exacerbation  Declines steroids  xopenex neb given in office w/ improved aeration.  Discussed various asthma treatment regimens   Plan  Continue on Qvar 2 puffs twice daily, rinse after use. May restart Spiriva 1 puff daily. May try Claritin 5 mg at bedtime as needed. For postnasal drip, drainage Saline nasal rinses as needed. For nasal congestion Followup with Dr. Gwenette Greet in 3-4 weeks and as needed Please contact office for sooner follow up if symptoms do not improve or worsen or seek emergency care  Pro [facility] sent to pharm for rescue use

## 2013-08-23 NOTE — Addendum Note (Signed)
Addended by: Parke Poisson E on: 08/23/2013 11:51 AM   Modules accepted: Orders

## 2013-08-23 NOTE — Patient Instructions (Addendum)
Continue on Qvar 2 puffs twice daily, rinse after use. May restart Spiriva 1 puff daily. May try Claritin 5 mg at bedtime as needed. For postnasal drip, drainage Saline nasal rinses as needed. For nasal congestion Followup with Dr. Gwenette Greet in 3-4 weeks and as needed Please contact office for sooner follow up if symptoms do not improve or worsen or seek emergency care    Late Add : ProAir HFA 2 puffs every 4hr as needed, sent to pharmacy for rescue use only

## 2013-08-24 DIAGNOSIS — L723 Sebaceous cyst: Secondary | ICD-10-CM | POA: Diagnosis not present

## 2013-08-24 DIAGNOSIS — L820 Inflamed seborrheic keratosis: Secondary | ICD-10-CM | POA: Diagnosis not present

## 2013-08-28 NOTE — Progress Notes (Signed)
Ov reviewed, and agree with plan as outlined.  She has declined staying on any type of LABA due to concerns over tachycardia.  She still has been hesitant to stay on ICS, and I suspect she has not been compliant with this.

## 2013-08-29 ENCOUNTER — Ambulatory Visit: Payer: Medicare Other | Admitting: Pulmonary Disease

## 2013-09-15 ENCOUNTER — Encounter: Payer: Self-pay | Admitting: Pulmonary Disease

## 2013-09-15 ENCOUNTER — Ambulatory Visit (INDEPENDENT_AMBULATORY_CARE_PROVIDER_SITE_OTHER): Payer: Medicare Other | Admitting: Pulmonary Disease

## 2013-09-15 VITALS — BP 100/60 | HR 87 | Temp 97.7°F | Ht 62.0 in | Wt 135.0 lb

## 2013-09-15 DIAGNOSIS — J449 Chronic obstructive pulmonary disease, unspecified: Secondary | ICD-10-CM

## 2013-09-15 DIAGNOSIS — J4489 Other specified chronic obstructive pulmonary disease: Secondary | ICD-10-CM

## 2013-09-15 NOTE — Progress Notes (Signed)
   Subjective:    Patient ID: [name], female    DOB: 1945-12-14, 68 y.o.   MRN: [id number]  HPI The patient comes in today for followup of her known chronic obstructive asthma. She had tried to come off Spiriva since the last visit, and had an acute exacerbation in July. She was seen by my nurse practitioner, and declined a prednisone taper. She was started back on her Spiriva, and she feels that she has just about returned to baseline. It is unclear whether she is staying on Qvar as compliantly as she needs to. She still has significant dyspnea on exertion, and tells me that she was recently walked by her oncologist up 2 flights with severe shortness of breath and tachycardia, but her saturations were 100%. I have explained to her this is a ventilation problem because of her underlying chronic obstructive asthma and her poor conditioning with muscle weakness.   Review of Systems  Constitutional: Negative for fever and unexpected weight change.  HENT: Negative for congestion, dental problem, ear pain, nosebleeds, postnasal drip, rhinorrhea, sinus pressure, sneezing, sore throat and trouble swallowing.   Eyes: Negative for redness and itching.  Respiratory: Negative for cough, chest tightness, shortness of breath and wheezing.   Cardiovascular: Negative for palpitations and leg swelling.  Gastrointestinal: Negative for nausea and vomiting.  Genitourinary: Negative for dysuria.  Musculoskeletal: Negative for joint swelling.  Skin: Negative for rash.  Neurological: Negative for headaches.  Hematological: Does not bruise/bleed easily.  Psychiatric/Behavioral: Negative for dysphoric mood. The patient is not nervous/anxious.        Objective:   Physical Exam Overweight female in no acute distress Nose without purulence or discharge noted Neck without lymphadenopathy or thyromegaly Chest is totally clear to auscultation, no wheezing Cardiac exam with regular rate and rhythm Lower  extremities without edema, no cyanosis       Assessment & Plan:

## 2013-09-15 NOTE — Patient Instructions (Signed)
Continue on qvar, and keep your mouth rinsed.  This is the most important medication for the treatment of your asthma Will try you on the spiriva respimat for the next 4 weeks to see if you like better than your current medication.  Take 2 inhalations each am.  Work on conditioning and weight reduction.  followup with me again in 38mos

## 2013-09-15 NOTE — Assessment & Plan Note (Addendum)
The patient is nearly back to baseline after a recent acute exacerbation, and I have stressed to her the importance of staying on Qvar daily as prescribed. She also apparently had a good response to Spiriva, with worsening on discontinuation. I have asked her to stay on this, but will try a different delivery device. In terms of her dyspnea on exertion, I explained that she has a degree of fixed obstruction that will always limit her breathing to some degree. However, she can increase her functional status by working on conditioning and modest weight loss.  Some of this may simply be due to to her underlying myopathy.

## 2013-09-22 DIAGNOSIS — N951 Menopausal and female climacteric states: Secondary | ICD-10-CM | POA: Diagnosis not present

## 2013-09-22 DIAGNOSIS — Z124 Encounter for screening for malignant neoplasm of cervix: Secondary | ICD-10-CM | POA: Diagnosis not present

## 2013-09-22 DIAGNOSIS — Z0000 Encounter for general adult medical examination without abnormal findings: Secondary | ICD-10-CM | POA: Diagnosis not present

## 2013-09-22 DIAGNOSIS — Z01419 Encounter for gynecological examination (general) (routine) without abnormal findings: Secondary | ICD-10-CM | POA: Diagnosis not present

## 2013-09-25 DIAGNOSIS — J449 Chronic obstructive pulmonary disease, unspecified: Secondary | ICD-10-CM | POA: Diagnosis not present

## 2013-09-25 DIAGNOSIS — I319 Disease of pericardium, unspecified: Secondary | ICD-10-CM | POA: Diagnosis not present

## 2013-09-25 DIAGNOSIS — IMO0002 Reserved for concepts with insufficient information to code with codable children: Secondary | ICD-10-CM | POA: Diagnosis not present

## 2013-09-25 DIAGNOSIS — I951 Orthostatic hypotension: Secondary | ICD-10-CM | POA: Diagnosis not present

## 2013-09-25 DIAGNOSIS — E8840 Mitochondrial metabolism disorder, unspecified: Secondary | ICD-10-CM | POA: Diagnosis not present

## 2013-09-25 DIAGNOSIS — Z1331 Encounter for screening for depression: Secondary | ICD-10-CM | POA: Diagnosis not present

## 2013-09-25 DIAGNOSIS — J45909 Unspecified asthma, uncomplicated: Secondary | ICD-10-CM | POA: Diagnosis not present

## 2013-09-27 ENCOUNTER — Other Ambulatory Visit (HOSPITAL_BASED_OUTPATIENT_CLINIC_OR_DEPARTMENT_OTHER): Payer: Medicare Other | Admitting: Lab

## 2013-09-27 ENCOUNTER — Ambulatory Visit: Payer: Medicare Other

## 2013-09-27 ENCOUNTER — Ambulatory Visit (HOSPITAL_BASED_OUTPATIENT_CLINIC_OR_DEPARTMENT_OTHER): Payer: Medicare Other | Admitting: Hematology & Oncology

## 2013-09-27 ENCOUNTER — Encounter: Payer: Self-pay | Admitting: Hematology & Oncology

## 2013-09-27 VITALS — BP 113/74 | HR 84 | Temp 97.7°F | Resp 14 | Ht 62.0 in | Wt 134.0 lb

## 2013-09-27 DIAGNOSIS — D45 Polycythemia vera: Secondary | ICD-10-CM

## 2013-09-27 DIAGNOSIS — D510 Vitamin B12 deficiency anemia due to intrinsic factor deficiency: Secondary | ICD-10-CM | POA: Diagnosis not present

## 2013-09-27 DIAGNOSIS — D751 Secondary polycythemia: Secondary | ICD-10-CM | POA: Diagnosis not present

## 2013-09-27 LAB — CBC WITH DIFFERENTIAL (CANCER CENTER ONLY)
BASO#: 0 10*3/uL (ref 0.0–0.2)
BASO%: 0.6 % (ref 0.0–2.0)
EOS%: 0.9 % (ref 0.0–7.0)
Eosinophils Absolute: 0.1 10*3/uL (ref 0.0–0.5)
HCT: 40.8 % (ref 34.8–46.6)
HGB: 13.4 g/dL (ref 11.6–15.9)
LYMPH#: 1 10*3/uL (ref 0.9–3.3)
LYMPH%: 15.6 % (ref 14.0–48.0)
MCH: 30 pg (ref 26.0–34.0)
MCHC: 32.8 g/dL (ref 32.0–36.0)
MCV: 92 fL (ref 81–101)
MONO#: 0.6 10*3/uL (ref 0.1–0.9)
MONO%: 9.6 % (ref 0.0–13.0)
NEUT#: 4.8 10*3/uL (ref 1.5–6.5)
NEUT%: 73.3 % (ref 39.6–80.0)
Platelets: 195 10*3/uL (ref 145–400)
RBC: 4.46 10*6/uL (ref 3.70–5.32)
RDW: 14.3 % (ref 11.1–15.7)
WBC: 6.6 10*3/uL (ref 3.9–10.0)

## 2013-09-27 LAB — CMP (CANCER CENTER ONLY)
ALT(SGPT): 27 U/L (ref 10–47)
AST: 40 U/L — ABNORMAL HIGH (ref 11–38)
Albumin: 3.4 g/dL (ref 3.3–5.5)
Alkaline Phosphatase: 56 U/L (ref 26–84)
BUN, Bld: 14 mg/dL (ref 7–22)
CO2: 25 mEq/L (ref 18–33)
Calcium: 8.6 mg/dL (ref 8.0–10.3)
Chloride: 101 mEq/L (ref 98–108)
Creat: 0.5 mg/dl — ABNORMAL LOW (ref 0.6–1.2)
Glucose, Bld: 101 mg/dL (ref 73–118)
Potassium: 3.6 mEq/L (ref 3.3–4.7)
Sodium: 139 mEq/L (ref 128–145)
Total Bilirubin: 1.4 mg/dl (ref 0.20–1.60)
Total Protein: 7.2 g/dL (ref 6.4–8.1)

## 2013-09-27 LAB — VITAMIN B12: Vitamin B-12: 267 pg/mL (ref 211–911)

## 2013-09-27 LAB — CHCC SATELLITE - SMEAR

## 2013-09-27 LAB — IRON AND TIBC CHCC
%SAT: 17 % — ABNORMAL LOW (ref 21–57)
Iron: 68 ug/dL (ref 41–142)
TIBC: 404 ug/dL (ref 236–444)
UIBC: 336 ug/dL (ref 120–384)

## 2013-09-27 LAB — FERRITIN CHCC: Ferritin: 15 ng/ml (ref 9–269)

## 2013-09-27 NOTE — Progress Notes (Signed)
No phlebotomy today for Mrs. [name] per m.d order.

## 2013-09-28 NOTE — Progress Notes (Signed)
Hematology and Oncology Follow Up Visit  [name] [id number] Feb 01, 1945 68 y.o. 09/28/2013   Principle Diagnosis:  1. 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 2. Hemochromatosis (H63D heterozygote mutation). 3. Asthma. 4. Mitochondrial myopathy.  Current Therapy:   Phlebotomy to maintain hematocrit below 42%. 2. Aspirin 81 mg p.o. daily.     Interim History:  [name] is in for followup. She feels well. She's not complaining of any shortness of breath. She's on a Spiriva inhaler. This really does make a difference for [name].   We  phlebotomized [name] back in July. This made [name] feel a little bit better.   The hemochromatosis has not been a problem. We did check [name] iron studies today. [name] ferritin was only 17. [name] iron saturation was 17%.  [name] appetite has been okay. She's had no nausea or vomiting. She's had no change in bowel habits. She's had no leg swelling. [name] been no rashes.  Medications: Current outpatient prescriptions:albuterol (PROAIR HFA) 108 (90 BASE) MCG/ACT inhaler, Inhale 2 puffs into the lungs every 4 (four) hours as needed for wheezing or shortness of breath., Disp: 1 Inhaler, Rfl: 3;  aspirin 81 MG tablet, Take 81 mg by mouth daily., Disp: , Rfl: ;  beclomethasone (QVAR) 80 MCG/ACT inhaler, Inhale 2 puffs into the lungs 2 (two) times daily., Disp: 1 Inhaler, Rfl: 6 hydrocortisone valerate cream (WESTCORT) 0.2 %, Apply 1 application topically as needed. , Disp: , Rfl: ;  LORazepam (ATIVAN) 0.5 MG tablet, Take 1 tablet (0.5 mg total) by mouth at bedtime., Disp: 30 tablet, Rfl: 5;  Melatonin 3 MG TABS, Take by mouth every morning., Disp: , Rfl: ;  progesterone (PROMETRIUM) 100 MG capsule, Take 100 mg by mouth daily. , Disp: , Rfl:  tiotropium (SPIRIVA) 18 MCG inhalation capsule, Place 18 mcg into inhaler and inhale daily., Disp: , Rfl: ;  Wheat Dextrin (CLEAR SOLUBLE FIBER) POWD, Take by mouth every morning., Disp: , Rfl:   Allergies: No Known Allergies  Past Medical  History, Surgical history, Social history, and Family History were reviewed and updated.  Review of Systems: As above  Physical Exam:  height is 5\' 2"  (1.575 m) and weight is 134 lb (60.782 kg). [name] oral temperature is 97.7 F (36.5 C). [name] blood pressure is 113/74 and [name] pulse is 84. [name] respiration is 14.   Well-developed and well-nourished white female in no obvious distress. Head and neck exam shows no ocular or oral lesion. There are no palpable cervical or supraclavicular lymph nodes. Lungs are clear. Cardiac exam regular in rhythm with no murmurs rubs or bruits. Abdomen is soft. She has good bowel sounds. There is no fluid wave. There is no palpable liver or spleen tip. Back exam shows no tenderness over the spine ribs or hips. Extremities shows no clubbing cyanosis or edema. Neurological exam shows no focal neurological deficits.  Lab Results  Component Value Date   WBC 6.6 09/27/2013   HGB 13.4 09/27/2013   HCT 40.8 09/27/2013   MCV 92 09/27/2013   PLT 195 09/27/2013     Chemistry      Component Value Date/Time   NA 139 09/27/2013 1058   NA 137 08/15/2013 0915   K 3.6 09/27/2013 1058   K 4.2 08/15/2013 0915   CL 101 09/27/2013 1058   CL 104 08/15/2013 0915   CO2 25 09/27/2013 1058   CO2 23 08/15/2013 0915   BUN 14 09/27/2013 1058   BUN 15 08/15/2013 0915   CREATININE 0.5* 09/27/2013  1058   CREATININE 0.60 08/15/2013 0915      Component Value Date/Time   CALCIUM 8.6 09/27/2013 1058   CALCIUM 8.8 08/15/2013 0915   ALKPHOS 56 09/27/2013 1058   ALKPHOS 60 08/15/2013 0915   AST 40* 09/27/2013 1058   AST 26 08/15/2013 0915   ALT 27 09/27/2013 1058   ALT 22 08/15/2013 0915   BILITOT 1.40 09/27/2013 1058   BILITOT 1.2 08/15/2013 0915      Ferritin is 15. Iron saturation 17%. Vitamin B12 disorder 67.   Impression and Plan: [name] is a 68 year old female. She has multiple hematologic issues. She has polycythemia.   She is on the be phlebotomized. She continues to do quite well. We did a phlebotomize  [name] back in July. I think in [name] blood flow 42% really is good for [name].  She does not have any issues with hemachromatosis. I don't think with [name] genetic mutation, that she will he will retain iron.  I think we can get [name] back in 6 weeks. I think this would be reasonable.   [name], MD 9/3/20157:09 AM

## 2013-10-03 DIAGNOSIS — H2589 Other age-related cataract: Secondary | ICD-10-CM | POA: Diagnosis not present

## 2013-10-03 DIAGNOSIS — D3130 Benign neoplasm of unspecified choroid: Secondary | ICD-10-CM | POA: Diagnosis not present

## 2013-10-11 ENCOUNTER — Telehealth: Payer: Self-pay | Admitting: Hematology & Oncology

## 2013-10-11 NOTE — Telephone Encounter (Signed)
Patient called and spoke with Rn.  Per Vivia Birmingham to sch patient for lab and sarah on 10/12/13.[name]  Patient was called and giving apt date and time

## 2013-10-12 ENCOUNTER — Other Ambulatory Visit (HOSPITAL_BASED_OUTPATIENT_CLINIC_OR_DEPARTMENT_OTHER): Payer: Medicare Other | Admitting: Lab

## 2013-10-12 ENCOUNTER — Ambulatory Visit (HOSPITAL_BASED_OUTPATIENT_CLINIC_OR_DEPARTMENT_OTHER): Payer: Medicare Other | Admitting: Family

## 2013-10-12 ENCOUNTER — Encounter: Payer: Self-pay | Admitting: Family

## 2013-10-12 VITALS — BP 128/58 | HR 82 | Temp 97.9°F | Resp 14 | Ht 62.0 in | Wt 136.0 lb

## 2013-10-12 DIAGNOSIS — D45 Polycythemia vera: Secondary | ICD-10-CM

## 2013-10-12 DIAGNOSIS — D509 Iron deficiency anemia, unspecified: Secondary | ICD-10-CM | POA: Insufficient documentation

## 2013-10-12 LAB — CMP (CANCER CENTER ONLY)
ALT(SGPT): 30 U/L (ref 10–47)
AST: 35 U/L (ref 11–38)
Albumin: 3.3 g/dL (ref 3.3–5.5)
Alkaline Phosphatase: 56 U/L (ref 26–84)
BUN, Bld: 14 mg/dL (ref 7–22)
CO2: 25 mEq/L (ref 18–33)
Calcium: 9.2 mg/dL (ref 8.0–10.3)
Chloride: 102 mEq/L (ref 98–108)
Creat: 0.6 mg/dl (ref 0.6–1.2)
Glucose, Bld: 122 mg/dL — ABNORMAL HIGH (ref 73–118)
Potassium: 3.9 mEq/L (ref 3.3–4.7)
Sodium: 142 mEq/L (ref 128–145)
Total Bilirubin: 1.5 mg/dl (ref 0.20–1.60)
Total Protein: 7.1 g/dL (ref 6.4–8.1)

## 2013-10-12 LAB — CBC WITH DIFFERENTIAL (CANCER CENTER ONLY)
BASO#: 0 10*3/uL (ref 0.0–0.2)
BASO%: 0.6 % (ref 0.0–2.0)
EOS%: 1.6 % (ref 0.0–7.0)
Eosinophils Absolute: 0.1 10*3/uL (ref 0.0–0.5)
HCT: 41.4 % (ref 34.8–46.6)
HGB: 13.8 g/dL (ref 11.6–15.9)
LYMPH#: 0.9 10*3/uL (ref 0.9–3.3)
LYMPH%: 13.5 % — ABNORMAL LOW (ref 14.0–48.0)
MCH: 30.6 pg (ref 26.0–34.0)
MCHC: 33.3 g/dL (ref 32.0–36.0)
MCV: 92 fL (ref 81–101)
MONO#: 0.8 10*3/uL (ref 0.1–0.9)
MONO%: 11.8 % (ref 0.0–13.0)
NEUT#: 5 10*3/uL (ref 1.5–6.5)
NEUT%: 72.5 % (ref 39.6–80.0)
Platelets: 173 10*3/uL (ref 145–400)
RBC: 4.51 10*6/uL (ref 3.70–5.32)
RDW: 14.7 % (ref 11.1–15.7)
WBC: 6.9 10*3/uL (ref 3.9–10.0)

## 2013-10-12 MED ORDER — FERUMOXYTOL INJECTION 510 MG/17 ML: 510.0000 mg | Freq: Once | INTRAVENOUS | Status: DC

## 2013-10-12 NOTE — Progress Notes (Signed)
[facility]  [phone] [phone]) [phone]  ID: [name] OB: [birth year] MR#: [id number]#:[id number] Patient Care Team: Leandrew Koyanagi, MD as PCP - General (Family Medicine) Aleda Grana (Dentistry)  DIAGNOSIS: 1. 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  2. Hemochromatosis (H63D heterozygote mutation).  3. Asthma.  4. Mitochondrial myopathy.  INTERVAL HISTORY: [name] is here today after calling yesterday and requesting to come in. She is not feeling well. She c/o extreme fatigue and aching "all over." She is finding it hard to get out of bed. She has asthma and COPD and has SOB with any type of exertion. She states that her diarrhea is improving. She was phlebotomized back in July and did well with it. The hemochromatosis has not been a problem. Earlier this month her ferritin was 17 and her iron saturation 17%. She denies fever, chills, n/v, cough, rash, headache, dizziness, palpitations, abdominal pain, constipation, blood in urine or stool. She says that she has had some mild chest pain at times but not currently. She has had no bleeding. She has had no swelling, tenderness, numbness or tingling in her extremities. Her appetite is good and she is trying to drink plenty of water.   CURRENT TREATMENT: 1. Phlebotomy to maintain hematocrit below 42%.  2. Aspirin 81 mg p.o. daily.  REVIEW OF SYSTEMS: All other 10 point review of systems is negative except for those issues mentioned above.   PAST MEDICAL HISTORY: Past Medical History  Diagnosis Date  . Pericarditis     age 70  . Pneumothorax   . Anxiety   . Iliotibial band syndrome     left knee  . Vitamin D deficiency   . Migraine   . Asthma   . Anemia   . IBS (irritable bowel syndrome)   . Hemorrhoids   . Cholelithiasis   . Mitochondrial myopathy   . Heart attack   . Osteoporosis   . Allergy   . Blood transfusion without reported diagnosis   . Polycythemia vera(238.4) 06/17/2012    PAST SURGICAL HISTORY: Past Surgical History  Procedure Laterality Date  . Appendectomy    . Cesarean section      x 4  . Nasal sinus surgery    . Vein surgery    . Cardiac catheterization     FAMILY HISTORY Family History  Problem Relation Age of Onset  . Asthma Mother   . Arthritis Mother   . Depression Mother   . Cancer Father   . Emphysema Father   . Stroke Father   . Alcohol abuse Father   . Heart attack Father   . Colon cancer Neg Hx    GYNECOLOGIC HISTORY:  No LMP recorded. Patient is postmenopausal.   SOCIAL HISTORY:  History   Social History  . Marital Status: Married    Spouse Name: Richard    Number of Children: 2  . Years of Education: Grad   Occupational History  . Psychologist, educational    Social History Main Topics  . Smoking status: Never Smoker   . Smokeless tobacco: Never Used     Comment: never used tobacco  . Alcohol Use: 1.2 oz/week    2 Glasses of wine per week     Comment: occasionally  . Drug Use: No  . Sexual Activity: Not on file   Other Topics Concern  . Not on file   Social History Narrative   Health Care POA:    Emergency Contact: husband, Francene Finders, (c) [phone]  End of Life Plan:    Who lives with you: husband   Any pets: none   Diet: Pt has a varied diet.  Eats 5 sm. meals throughout day, focuses on protein, doesn't care for fruits and vegetables very much.   Exercise: Pt has a personal training and exercises several times a week.   Seatbelts: Pt reports wearing seatbelt when in vehicle.   Nancy Fetter Exposure/Protection: Pt reports wearing sun protection.    Hobbies: reading, visiting with friends   Patient has a Scientist, water quality.   Patient has two children.   Patient is retired.   Patient does not drink any caffeine.   Patient is right handed.            ADVANCED DIRECTIVES: <no information>  HEALTH MAINTENANCE: History  Substance Use Topics  . Smoking status: Never Smoker   . Smokeless tobacco: Never Used      Comment: never used tobacco  . Alcohol Use: 1.2 oz/week    2 Glasses of wine per week     Comment: occasionally   Colonoscopy: PAP: Bone density: Lipid panel:  No Known Allergies  Current Outpatient Prescriptions  Medication Sig Dispense Refill  . albuterol (PROAIR HFA) 108 (90 BASE) MCG/ACT inhaler Inhale 2 puffs into the lungs every 4 (four) hours as needed for wheezing or shortness of breath.  1 Inhaler  3  . aspirin 81 MG tablet Take 81 mg by mouth daily.      . beclomethasone (QVAR) 80 MCG/ACT inhaler Inhale 2 puffs into the lungs 2 (two) times daily.  1 Inhaler  6  . hydrocortisone valerate cream (WESTCORT) 0.2 % Apply 1 application topically as needed.       Marland Kitchen LORazepam (ATIVAN) 0.5 MG tablet Take 1 tablet (0.5 mg total) by mouth at bedtime.  30 tablet  5  . Melatonin 3 MG TABS Take by mouth every morning.      . progesterone (PROMETRIUM) 100 MG capsule Take 100 mg by mouth daily.       Marland Kitchen tiotropium (SPIRIVA) 18 MCG inhalation capsule Place 18 mcg into inhaler and inhale daily.      . Wheat Dextrin (CLEAR SOLUBLE FIBER) POWD Take by mouth every morning.       No current facility-administered medications for this visit.   OBJECTIVE: Filed Vitals:   10/12/13 1413  BP: 128/58  Pulse: 82  Temp: 97.9 F (36.6 C)  Resp: 14   Body mass index is 24.87 kg/(m^2). ECOG FS:1 - Symptomatic but completely ambulatory Ocular: Sclerae unicteric, pupils equal, round and reactive to light Ear-nose-throat: Oropharynx clear, dentition fair Lymphatic: No cervical or supraclavicular adenopathy Lungs no rales or rhonchi, good excursion bilaterally Heart regular rate and rhythm, no murmur appreciated Abd soft, nontender, positive bowel sounds MSK no focal spinal tenderness, no joint edema Neuro: non-focal, well-oriented, appropriate affect Breasts: Deferred  LAB RESULTS: CMP     Component Value Date/Time   NA 142 10/12/2013 1403   NA 137 08/15/2013 0915   K 3.9 10/12/2013 1403   K 4.2  08/15/2013 0915   CL 102 10/12/2013 1403   CL 104 08/15/2013 0915   CO2 25 10/12/2013 1403   CO2 23 08/15/2013 0915   GLUCOSE 122* 10/12/2013 1403   GLUCOSE 111* 08/15/2013 0915   BUN 14 10/12/2013 1403   BUN 15 08/15/2013 0915   CREATININE 0.6 10/12/2013 1403   CREATININE 0.60 08/15/2013 0915   CALCIUM 9.2 10/12/2013 1403   CALCIUM 8.8 08/15/2013 0915  PROT 7.1 10/12/2013 1403   PROT 6.7 08/15/2013 0915   PROT 6.4 01/13/2013 1122   ALBUMIN 3.8 08/15/2013 0915   AST 35 10/12/2013 1403   AST 26 08/15/2013 0915   ALT 30 10/12/2013 1403   ALT 22 08/15/2013 0915   ALKPHOS 56 10/12/2013 1403   ALKPHOS 60 08/15/2013 0915   BILITOT 1.50 10/12/2013 1403   BILITOT 1.2 08/15/2013 0915   GFRNONAA >60 09/28/2010 0737   GFRAA >60 09/28/2010 0737   No results found for this basename: SPEP, UPEP,  kappa and lambda light chains   Lab Results  Component Value Date   WBC 6.9 10/12/2013   NEUTROABS 5.0 10/12/2013   HGB 13.8 10/12/2013   HCT 41.4 10/12/2013   MCV 92 10/12/2013   PLT 173 10/12/2013   No results found for this basename: LABCA2   No components found with this basename: LABCA125   No results found for this basename: INR,  in the last 168 hours Urinalysis No results found for this basename: colorurine, appearanceur, labspec, phurine, glucoseu, hgbur, bilirubinur, ketonesur, proteinur, urobilinogen, nitrite, leukocytesur   STUDIES: No results found.  ASSESSMENT/PLAN: [name] is a 68 year old female with polycythemia. She was last phlebotomized in July. She did well with this.  Today her CBC was good. She does not need phlebotomized at this time.  She does not have any issues with hemachromatosis. With her genetic mutation she should not retain iron. She actually very iron deficient and symptomatic with severe fatigue and body aches.  After speaking with Dr. Marin Olp about this we have decided to give her 9 of Fereheme tomorrow and hopefully this will help her feel better.  We will see her back in  October for her already scheduled follow-up appointment.  She knows to call here with any questions or concerns and to go to the ED in the event of an emergency. We can certainly see her sooner if need be.   Eliezer Bottom, NP 10/12/2013 2:54 PM

## 2013-10-13 ENCOUNTER — Ambulatory Visit (HOSPITAL_BASED_OUTPATIENT_CLINIC_OR_DEPARTMENT_OTHER): Payer: Medicare Other

## 2013-10-13 VITALS — BP 107/52 | HR 96 | Temp 98.1°F | Resp 20

## 2013-10-13 DIAGNOSIS — D509 Iron deficiency anemia, unspecified: Secondary | ICD-10-CM | POA: Diagnosis not present

## 2013-10-13 DIAGNOSIS — D510 Vitamin B12 deficiency anemia due to intrinsic factor deficiency: Secondary | ICD-10-CM

## 2013-10-13 MED ORDER — SODIUM CHLORIDE 0.9 % IV SOLN
Freq: Once | INTRAVENOUS | Status: AC
  Administered 2013-10-13: 12:00:00 via INTRAVENOUS

## 2013-10-13 MED ORDER — SODIUM CHLORIDE 0.9 % IV SOLN
510.0000 mg | Freq: Once | INTRAVENOUS | Status: AC
  Administered 2013-10-13: 510 mg via INTRAVENOUS
  Filled 2013-10-13: qty 17

## 2013-10-13 MED ORDER — METHYLPREDNISOLONE SODIUM SUCC 125 MG IJ SOLR
80.0000 mg | Freq: Once | INTRAMUSCULAR | Status: AC
  Administered 2013-10-13: 80 mg via INTRAVENOUS

## 2013-10-13 MED ORDER — DIPHENHYDRAMINE HCL 50 MG/ML IJ SOLN
12.5000 mg | Freq: Once | INTRAMUSCULAR | Status: AC
  Administered 2013-10-13: 12.5 mg via INTRAVENOUS

## 2013-10-13 NOTE — Progress Notes (Signed)
1157 Complain of lightheadedness, Iron infusiion stopped, Dr. Marin Olp notified. Orders received.n  1230 Iron infusion restarted. 1:30 PM Iron infused without further problem.

## 2013-10-13 NOTE — Patient Instructions (Signed)
TIBC, Transferrin, Total Iron-Binding Capacity  This is a test to learn about your body's ability to transport iron. Your caregiver may do this when they suspect you may have too much or too little iron in your body because of a variety of conditions. The test also helps to monitor liver function and nutrition. TIBC measures the total capacity of your blood to transport iron. TIBC correlates with the amount of the protein transferrin in your blood. Transferrin is a protein that attaches iron molecules and transports iron to the blood plasma. Transferrin is largely made in the liver and regulates your body's iron absorption into the blood.  PREPARATION FOR TEST A blood sample is obtained by inserting a needle into a vein in the arm. NORMAL FINDINGS  Iron  Female: 80-180 mcg/dL or 14-32 micromole/L (SI units)  Female: 60-160 mcg/dL or 11-29 micromole/L (SI units)  Newborn: 100-250 mcg/dL  Child: 50-120 mcg/dL  TIBC  250-460 mcg/dL or 45-82 micromole/L (SI units)  Transferrin  Adult female: 215-365 mg/dL or 2.15-3.65 g/L (SI units)  Adult female: 250-380 mg/dL or 2.50-3.80 g/L (SI units)  Newborn: 130-275 mg/dL  Child: 203-360 mg/dL  Transferrin Saturation  Males: 20% to 50%  Females: 15% to 50% Ranges for normal findings may vary among different laboratories and hospitals. You should always check with your doctor after having lab work or other tests done to discuss the meaning of your test results and whether your values are considered within normal limits. MEANING OF TEST  Your caregiver will go over the test results with you and discuss the importance and meaning of your results, as well as treatment options and the need for additional tests if necessary. OBTAINING THE TEST RESULTS It is your responsibility to obtain your test results. Ask the lab or department performing the test when and how you will get your results. Document Released: 02/15/2004 Document Revised: 05/29/2013  Document Reviewed: 12/25/2007 [facility] Patient Information 2015 [place], [place]. This information is not intended to replace advice given to you by your health care provider. Make sure you discuss any questions you have with your health care provider. Ferumoxytol injection What is this medicine? FERUMOXYTOL is an iron complex. Iron is used to make healthy red blood cells, which carry oxygen and nutrients throughout the body. This medicine is used to treat iron deficiency anemia in people with chronic kidney disease. This medicine may be used for other purposes; ask your health care provider or pharmacist if you have questions. COMMON BRAND NAME(S): Feraheme What should I tell my health care provider before I take this medicine? They need to know if you have any of these conditions: -anemia not caused by low iron levels -high levels of iron in the blood -magnetic resonance imaging (MRI) test scheduled -an unusual or allergic reaction to iron, other medicines, foods, dyes, or preservatives -pregnant or trying to get pregnant -breast-feeding How should I use this medicine? This medicine is for injection into a vein. It is given by a health care professional in a hospital or clinic setting. Talk to your pediatrician regarding the use of this medicine in children. Special care may be needed. Overdosage: If you think you've taken too much of this medicine contact a poison control center or emergency room at once. Overdosage: If you think you have taken too much of this medicine contact a poison control center or emergency room at once. NOTE: This medicine is only for you. Do not share this medicine with others. What if I miss  a dose? It is important not to miss your dose. Call your doctor or health care professional if you are unable to keep an appointment. What may interact with this medicine? This medicine may interact with the following medications: -other iron products This list may not  describe all possible interactions. Give your health care provider a list of all the medicines, herbs, non-prescription drugs, or dietary supplements you use. Also tell them if you smoke, drink alcohol, or use illegal drugs. Some items may interact with your medicine. What should I watch for while using this medicine? Visit your doctor or healthcare professional regularly. Tell your doctor or healthcare professional if your symptoms do not start to get better or if they get worse. You may need blood work done while you are taking this medicine. You may need to follow a special diet. Talk to your doctor. Foods that contain iron include: whole grains/cereals, dried fruits, beans, or peas, leafy green vegetables, and organ meats (liver, kidney). What side effects may I notice from receiving this medicine? Side effects that you should report to your doctor or health care professional as soon as possible: -allergic reactions like skin rash, itching or hives, swelling of the face, lips, or tongue -breathing problems -changes in blood pressure -feeling faint or lightheaded, falls -fever or chills -flushing, sweating, or hot feelings -swelling of the ankles or feet Side effects that usually do not require medical attention (Report these to your doctor or health care professional if they continue or are bothersome.): -diarrhea -headache -nausea, vomiting -stomach pain This list may not describe all possible side effects. Call your doctor for medical advice about side effects. You may report side effects to FDA at 1-800-FDA-1088. Where should I keep my medicine? This drug is given in a hospital or clinic and will not be stored at home. NOTE: This sheet is a summary. It may not cover all possible information. If you have questions about this medicine, talk to your doctor, pharmacist, or health care provider.  2015, Elsevier/Gold Standard. (2011-08-28 15:23:36)

## 2013-10-24 ENCOUNTER — Encounter: Payer: Self-pay | Admitting: *Deleted

## 2013-10-24 NOTE — Progress Notes (Signed)
Received phone call from pt stating that she still feels fatigued after having her iron transfusion on 10/13/13.  Nurse spoke with NP and NP stated for me to inform the pt that the iron transfusion could take up to one month to help her feel more energized. Pt verbalized understanding and stated she would call if further problems arose.

## 2013-10-26 ENCOUNTER — Telehealth: Payer: Self-pay | Admitting: Pulmonary Disease

## 2013-10-26 NOTE — Telephone Encounter (Signed)
[place] pt did try the spiriva respimat and does like how this helps.  She is wanting to have rx sent to her pharmacy.  Do you want to stop the qvar or continue this as well.  Please advise. Thanks  No Known Allergies  Current Outpatient Prescriptions on File Prior to Visit  Medication Sig Dispense Refill  . albuterol (PROAIR HFA) 108 (90 BASE) MCG/ACT inhaler Inhale 2 puffs into the lungs every 4 (four) hours as needed for wheezing or shortness of breath.  1 Inhaler  3  . aspirin 81 MG tablet Take 81 mg by mouth daily.      . beclomethasone (QVAR) 80 MCG/ACT inhaler Inhale 2 puffs into the lungs 2 (two) times daily.  1 Inhaler  6  . hydrocortisone valerate cream (WESTCORT) 0.2 % Apply 1 application topically as needed.       Marland Kitchen LORazepam (ATIVAN) 0.5 MG tablet Take 1 tablet (0.5 mg total) by mouth at bedtime.  30 tablet  5  . Melatonin 3 MG TABS Take by mouth every morning.      . progesterone (PROMETRIUM) 100 MG capsule Take 100 mg by mouth daily.       Marland Kitchen tiotropium (SPIRIVA) 18 MCG inhalation capsule Place 18 mcg into inhaler and inhale daily.      . Wheat Dextrin (CLEAR SOLUBLE FIBER) POWD Take by mouth every morning.       No current facility-administered medications on file prior to visit.

## 2013-10-27 MED ORDER — TIOTROPIUM BROMIDE MONOHYDRATE 2.5 MCG/ACT IN AERS: 2.0000 | INHALATION_SPRAY | Freq: Every day | RESPIRATORY_TRACT | Status: DC

## 2013-10-27 NOTE — Telephone Encounter (Signed)
Pt aware of recs per [place] Rx sent to [facility] [place] Nothing further needed.

## 2013-10-27 NOTE — Telephone Encounter (Signed)
She is to stay on qvar while on spiriva

## 2013-11-06 ENCOUNTER — Other Ambulatory Visit: Payer: Medicare Other | Admitting: Lab

## 2013-11-06 ENCOUNTER — Ambulatory Visit: Payer: Medicare Other | Admitting: Hematology & Oncology

## 2013-11-06 ENCOUNTER — Other Ambulatory Visit: Payer: Self-pay

## 2013-11-06 DIAGNOSIS — D510 Vitamin B12 deficiency anemia due to intrinsic factor deficiency: Secondary | ICD-10-CM

## 2013-11-06 DIAGNOSIS — D45 Polycythemia vera: Secondary | ICD-10-CM

## 2013-11-07 ENCOUNTER — Other Ambulatory Visit (HOSPITAL_BASED_OUTPATIENT_CLINIC_OR_DEPARTMENT_OTHER): Payer: Medicare Other | Admitting: Hematology & Oncology

## 2013-11-07 ENCOUNTER — Ambulatory Visit (HOSPITAL_BASED_OUTPATIENT_CLINIC_OR_DEPARTMENT_OTHER): Payer: Medicare Other | Admitting: Hematology & Oncology

## 2013-11-07 ENCOUNTER — Other Ambulatory Visit (HOSPITAL_BASED_OUTPATIENT_CLINIC_OR_DEPARTMENT_OTHER): Payer: Medicare Other | Admitting: Lab

## 2013-11-07 ENCOUNTER — Encounter: Payer: Self-pay | Admitting: Hematology & Oncology

## 2013-11-07 ENCOUNTER — Ambulatory Visit: Payer: Medicare Other

## 2013-11-07 ENCOUNTER — Ambulatory Visit (HOSPITAL_BASED_OUTPATIENT_CLINIC_OR_DEPARTMENT_OTHER): Payer: Medicare Other

## 2013-11-07 VITALS — BP 107/62 | HR 87 | Temp 98.0°F | Resp 16

## 2013-11-07 VITALS — BP 131/62 | HR 80 | Temp 97.4°F | Resp 14 | Wt 136.0 lb

## 2013-11-07 DIAGNOSIS — R635 Abnormal weight gain: Secondary | ICD-10-CM

## 2013-11-07 DIAGNOSIS — Z23 Encounter for immunization: Secondary | ICD-10-CM | POA: Diagnosis not present

## 2013-11-07 DIAGNOSIS — D649 Anemia, unspecified: Secondary | ICD-10-CM

## 2013-11-07 DIAGNOSIS — D45 Polycythemia vera: Secondary | ICD-10-CM

## 2013-11-07 DIAGNOSIS — D510 Vitamin B12 deficiency anemia due to intrinsic factor deficiency: Secondary | ICD-10-CM

## 2013-11-07 DIAGNOSIS — G713 Mitochondrial myopathy, not elsewhere classified: Secondary | ICD-10-CM

## 2013-11-07 DIAGNOSIS — D751 Secondary polycythemia: Secondary | ICD-10-CM

## 2013-11-07 LAB — CBC WITH DIFFERENTIAL (CANCER CENTER ONLY)
BASO#: 0 10*3/uL (ref 0.0–0.2)
BASO%: 0.5 % (ref 0.0–2.0)
EOS%: 1.4 % (ref 0.0–7.0)
Eosinophils Absolute: 0.1 10*3/uL (ref 0.0–0.5)
HCT: 43.7 % (ref 34.8–46.6)
HGB: 14.9 g/dL (ref 11.6–15.9)
LYMPH#: 1 10*3/uL (ref 0.9–3.3)
LYMPH%: 16.2 % (ref 14.0–48.0)
MCH: 32.5 pg (ref 26.0–34.0)
MCHC: 34.1 g/dL (ref 32.0–36.0)
MCV: 95 fL (ref 81–101)
MONO#: 0.7 10*3/uL (ref 0.1–0.9)
MONO%: 11.9 % (ref 0.0–13.0)
NEUT#: 4.1 10*3/uL (ref 1.5–6.5)
NEUT%: 70 % (ref 39.6–80.0)
Platelets: 140 10*3/uL — ABNORMAL LOW (ref 145–400)
RBC: 4.59 10*6/uL (ref 3.70–5.32)
RDW: 17.7 % — ABNORMAL HIGH (ref 11.1–15.7)
WBC: 5.9 10*3/uL (ref 3.9–10.0)

## 2013-11-07 LAB — COMPREHENSIVE METABOLIC PANEL
ALT: 30 U/L (ref 0–35)
AST: 30 U/L (ref 0–37)
Albumin: 4.1 g/dL (ref 3.5–5.2)
Alkaline Phosphatase: 64 U/L (ref 39–117)
BUN: 12 mg/dL (ref 6–23)
CO2: 24 mEq/L (ref 19–32)
Calcium: 9.5 mg/dL (ref 8.4–10.5)
Chloride: 104 mEq/L (ref 96–112)
Creatinine, Ser: 0.68 mg/dL (ref 0.50–1.10)
Glucose, Bld: 98 mg/dL (ref 70–99)
Potassium: 3.7 mEq/L (ref 3.5–5.3)
Sodium: 138 mEq/L (ref 135–145)
Total Bilirubin: 1.3 mg/dL — ABNORMAL HIGH (ref 0.2–1.2)
Total Protein: 6.6 g/dL (ref 6.0–8.3)

## 2013-11-07 LAB — IRON AND TIBC CHCC
%SAT: 68 % — ABNORMAL HIGH (ref 21–57)
Iron: 189 ug/dL — ABNORMAL HIGH (ref 41–142)
TIBC: 279 ug/dL (ref 236–444)
UIBC: 90 ug/dL — ABNORMAL LOW (ref 120–384)

## 2013-11-07 LAB — TSH CHCC: TSH: 2.552 m(IU)/L (ref 0.308–3.960)

## 2013-11-07 LAB — FERRITIN CHCC: Ferritin: 234 ng/ml (ref 9–269)

## 2013-11-07 MED ORDER — L-CARNITINE 500 MG PO CAPS: 1000.0000 mg | ORAL_CAPSULE | Freq: Every day | ORAL | Status: DC

## 2013-11-07 MED ORDER — INFLUENZA VAC SPLIT QUAD 0.5 ML IM SUSY
0.5000 mL | PREFILLED_SYRINGE | Freq: Once | INTRAMUSCULAR | Status: AC
  Administered 2013-11-07: 0.5 mL via INTRAMUSCULAR
  Filled 2013-11-07: qty 0.5

## 2013-11-07 NOTE — Patient Instructions (Signed)

## 2013-11-07 NOTE — Progress Notes (Signed)
[name] presents today for phlebotomy per MD orders. Phlebotomy procedure started at 1034 and ended at 1040. 528mL removed. Patient observed for 30 minutes after procedure without any incident. Patient tolerated procedure well. IV needle removed intact.

## 2013-11-08 ENCOUNTER — Telehealth: Payer: Self-pay | Admitting: Oncology

## 2013-11-08 NOTE — Telephone Encounter (Signed)
There is a treatment plan for Vit B12 monthly, notified Liliane Channel to call pt and put her on our schedule.  Verbalized understanding.

## 2013-11-08 NOTE — Progress Notes (Signed)
Hematology and Oncology Follow Up Visit  [name] [id number] 01/13/1946 68 y.o. 11/08/2013   Principle Diagnosis:  1. 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 2. Hemochromatosis (H63D heterozygote mutation). 3. Asthma. 4. Mitochondrial myopathy.  Current Therapy:   1. Phlebotomy to maintain hematocrit below 42%. 2. Aspirin 81 mg p.o. daily.     Interim History:  [name] is coming for followup. She's not doing well. Unfortunately, she does not have a family doctor now. As such, she thinks that and we will take care of her problems. We'll have to get her a family doctor.  I really feel that her issues are not related to any kind of hematologic issue. She keeps thinking that her anemia is caused her problems. She really is not anemic. I explained that to her at length.  I wonder if her mitochondrial issue is a problem. Again it, she needs to be seen by somebody for this.  Her last thyroid levels weren't tested a year ago. We will add this.  Her breathing seems to be doing okay.  She just feels very fatigued.  We actually gave her some iron several weeks ago. I thought that this might help her issue. It really did not.  She has had no fever. Her appetite is okay. She's worried about gaining weight. Her weight today is stable.  She's had no change in bowel or bladder habits. There has been no leg swelling.  Medications: Current outpatient prescriptions:albuterol (PROAIR HFA) 108 (90 BASE) MCG/ACT inhaler, Inhale 2 puffs into the lungs every 4 (four) hours as needed for wheezing or shortness of breath., Disp: 1 Inhaler, Rfl: 3;  aspirin 81 MG tablet, Take 81 mg by mouth daily., Disp: , Rfl: ;  beclomethasone (QVAR) 80 MCG/ACT inhaler, Inhale 2 puffs into the lungs 2 (two) times daily., Disp: 1 Inhaler, Rfl: 6 hydrocortisone valerate cream (WESTCORT) 0.2 %, Apply 1 application topically as needed. , Disp: , Rfl: ;  LORazepam (ATIVAN) 0.5 MG tablet, Take 1 tablet (0.5 mg total) by  mouth at bedtime., Disp: 30 tablet, Rfl: 5;  Melatonin 3 MG TABS, Take by mouth every morning., Disp: , Rfl: ;  progesterone (PROMETRIUM) 100 MG capsule, Take 100 mg by mouth daily. , Disp: , Rfl:  Tiotropium Bromide Monohydrate (SPIRIVA RESPIMAT) 2.5 MCG/ACT AERS, Inhale 2 puffs into the lungs daily., Disp: 1 Inhaler, Rfl: 6;  Wheat Dextrin (CLEAR SOLUBLE FIBER) POWD, Take by mouth every morning., Disp: , Rfl: ;  LevOCARNitine L-Tartrate (L-CARNITINE) 500 MG CAPS, Take 2 capsules (1,000 mg total) by mouth daily., Disp: 60 capsule, Rfl: 4  Allergies: No Known Allergies  Past Medical History, Surgical history, Social history, and Family History were reviewed and updated.  Review of Systems: As above  Physical Exam:  weight is 136 lb (61.689 kg). Her oral temperature is 97.4 F (36.3 C). Her blood pressure is 131/62 and her pulse is 80. Her respiration is 14.   Fairly well developed and well-nourished white female. Head and neck exam shows no ocular or oral lesions. There are no palpable cervical or supraclavicular lymph nodes. Lungs are clear. Cardiac exam regular rate and rhythm with no murmurs, rubs or bruits. Abdomen is soft. She has good bowel sounds. There is no fluid wave. There is no palpable liver or spleen tip. Extremities shows no clubbing, cyanosis or edema. Neurological exam is non-focal. Skin exam no rashes.  Lab Results  Component Value Date   WBC 5.9 11/07/2013   HGB 14.9 11/07/2013   HCT  43.7 11/07/2013   MCV 95 11/07/2013   PLT 140* 11/07/2013     Chemistry      Component Value Date/Time   NA 138 11/07/2013 0904   NA 142 10/12/2013 1403   K 3.7 11/07/2013 0904   K 3.9 10/12/2013 1403   CL 104 11/07/2013 0904   CL 102 10/12/2013 1403   CO2 24 11/07/2013 0904   CO2 25 10/12/2013 1403   BUN 12 11/07/2013 0904   BUN 14 10/12/2013 1403   CREATININE 0.68 11/07/2013 0904   CREATININE 0.6 10/12/2013 1403      Component Value Date/Time   CALCIUM 9.5 11/07/2013 0904    CALCIUM 9.2 10/12/2013 1403   ALKPHOS 64 11/07/2013 0904   ALKPHOS 56 10/12/2013 1403   AST 30 11/07/2013 0904   AST 35 10/12/2013 1403   ALT 30 11/07/2013 0904   ALT 30 10/12/2013 1403   BILITOT 1.3* 11/07/2013 0904   BILITOT 1.50 10/12/2013 1403     TSH is 2.55. Ferritin 238. Iron saturation is 68%.    Impression and Plan: [name] is 68 year old female who. She has polycythemia. She has hemachromatosis. She has mitochondrial myopathy.  We will go ahead and phlebotomize her today. This may help her a little bit.  Again, I talked to her at length. I spent about 45 minutes with her. I told her that she needs a family doctor. She needs to have this mitochondrial issue looked at. These are things that we just cannot do here in our office.  She is not hypothyroid.  I did go ahead and give her a prescription for L-carnitine to try to help with the myopathy. Also told her to try some CoQ supplement. I don't see how this is want to hurt her.  We will plan to get her back in one month.   [name], MD 10/14/20157:42 AM

## 2013-11-10 ENCOUNTER — Ambulatory Visit (HOSPITAL_BASED_OUTPATIENT_CLINIC_OR_DEPARTMENT_OTHER): Payer: Medicare Other

## 2013-11-10 VITALS — BP 119/57 | HR 91 | Temp 98.1°F | Resp 16

## 2013-11-10 DIAGNOSIS — D509 Iron deficiency anemia, unspecified: Secondary | ICD-10-CM

## 2013-11-10 DIAGNOSIS — D510 Vitamin B12 deficiency anemia due to intrinsic factor deficiency: Secondary | ICD-10-CM

## 2013-11-10 MED ORDER — CYANOCOBALAMIN 1000 MCG/ML IJ SOLN
1000.0000 ug | Freq: Once | INTRAMUSCULAR | Status: AC
  Administered 2013-11-10: 1000 ug via INTRAMUSCULAR

## 2013-11-10 MED ORDER — CYANOCOBALAMIN 1000 MCG/ML IJ SOLN
INTRAMUSCULAR | Status: AC
  Filled 2013-11-10: qty 1

## 2013-11-10 NOTE — Patient Instructions (Signed)

## 2013-11-13 DIAGNOSIS — E785 Hyperlipidemia, unspecified: Secondary | ICD-10-CM | POA: Diagnosis not present

## 2013-11-13 DIAGNOSIS — D510 Vitamin B12 deficiency anemia due to intrinsic factor deficiency: Secondary | ICD-10-CM | POA: Diagnosis not present

## 2013-11-13 DIAGNOSIS — Z0000 Encounter for general adult medical examination without abnormal findings: Secondary | ICD-10-CM | POA: Diagnosis not present

## 2013-11-20 DIAGNOSIS — J45909 Unspecified asthma, uncomplicated: Secondary | ICD-10-CM | POA: Diagnosis not present

## 2013-11-20 DIAGNOSIS — D510 Vitamin B12 deficiency anemia due to intrinsic factor deficiency: Secondary | ICD-10-CM | POA: Diagnosis not present

## 2013-11-20 DIAGNOSIS — E559 Vitamin D deficiency, unspecified: Secondary | ICD-10-CM | POA: Diagnosis not present

## 2013-11-20 DIAGNOSIS — G713 Mitochondrial myopathy, not elsewhere classified: Secondary | ICD-10-CM | POA: Diagnosis not present

## 2013-11-20 DIAGNOSIS — J449 Chronic obstructive pulmonary disease, unspecified: Secondary | ICD-10-CM | POA: Diagnosis not present

## 2013-11-20 DIAGNOSIS — Z0000 Encounter for general adult medical examination without abnormal findings: Secondary | ICD-10-CM | POA: Diagnosis not present

## 2013-11-20 DIAGNOSIS — D751 Secondary polycythemia: Secondary | ICD-10-CM | POA: Diagnosis not present

## 2013-11-21 DIAGNOSIS — Z1212 Encounter for screening for malignant neoplasm of rectum: Secondary | ICD-10-CM | POA: Diagnosis not present

## 2013-11-23 DIAGNOSIS — I788 Other diseases of capillaries: Secondary | ICD-10-CM | POA: Diagnosis not present

## 2013-11-23 DIAGNOSIS — L820 Inflamed seborrheic keratosis: Secondary | ICD-10-CM | POA: Diagnosis not present

## 2013-11-23 DIAGNOSIS — L821 Other seborrheic keratosis: Secondary | ICD-10-CM | POA: Diagnosis not present

## 2013-11-24 ENCOUNTER — Other Ambulatory Visit: Payer: Self-pay | Admitting: Internal Medicine

## 2013-11-25 NOTE — Telephone Encounter (Signed)
Rx called in 

## 2013-11-29 ENCOUNTER — Ambulatory Visit: Payer: Medicare Other | Admitting: Internal Medicine

## 2013-12-01 ENCOUNTER — Telehealth: Payer: Self-pay | Admitting: Pulmonary Disease

## 2013-12-01 NOTE — Telephone Encounter (Signed)
Called spoke with pt. She reports she is now in the donut hold and spiriva RX will cost $285.00 and QVAR will cost $76.00. She still has about 1 weeks worth of medication but not able to afford the cost of these meds until January. Please advise if okay to give pt samples KC thanks

## 2013-12-04 NOTE — Telephone Encounter (Signed)
Would not give samples.  Would get her paperwork for the patient assistance program, and she can also see if there are less expensive alternatives on her plan.

## 2013-12-04 NOTE — Telephone Encounter (Signed)
LMTCB

## 2013-12-05 NOTE — Telephone Encounter (Signed)
Spoke with pt and advised of Dr Janifer Adie recommendations.  Pt assistance forms mailed to pt.  Pt will also check with insurance company to see  If there is a cheaper alternative.

## 2013-12-05 NOTE — Telephone Encounter (Signed)
[phone] returning a call

## 2013-12-07 DIAGNOSIS — D510 Vitamin B12 deficiency anemia due to intrinsic factor deficiency: Secondary | ICD-10-CM | POA: Diagnosis not present

## 2013-12-11 ENCOUNTER — Other Ambulatory Visit: Payer: Self-pay | Admitting: *Deleted

## 2013-12-11 DIAGNOSIS — D510 Vitamin B12 deficiency anemia due to intrinsic factor deficiency: Secondary | ICD-10-CM

## 2013-12-12 ENCOUNTER — Encounter: Payer: Self-pay | Admitting: Family

## 2013-12-12 ENCOUNTER — Ambulatory Visit (HOSPITAL_BASED_OUTPATIENT_CLINIC_OR_DEPARTMENT_OTHER): Payer: Medicare Other | Admitting: Family

## 2013-12-12 ENCOUNTER — Ambulatory Visit: Payer: Medicare Other

## 2013-12-12 ENCOUNTER — Other Ambulatory Visit (HOSPITAL_BASED_OUTPATIENT_CLINIC_OR_DEPARTMENT_OTHER): Payer: Medicare Other | Admitting: Lab

## 2013-12-12 VITALS — BP 122/62 | HR 80 | Temp 97.6°F | Resp 14 | Ht 62.0 in | Wt 136.0 lb

## 2013-12-12 VITALS — BP 118/63 | HR 87 | Temp 98.1°F | Resp 14 | Ht 62.0 in | Wt 136.0 lb

## 2013-12-12 DIAGNOSIS — D45 Polycythemia vera: Secondary | ICD-10-CM

## 2013-12-12 DIAGNOSIS — D510 Vitamin B12 deficiency anemia due to intrinsic factor deficiency: Secondary | ICD-10-CM

## 2013-12-12 DIAGNOSIS — M79605 Pain in left leg: Principal | ICD-10-CM | POA: Insufficient documentation

## 2013-12-12 DIAGNOSIS — M79604 Pain in right leg: Secondary | ICD-10-CM

## 2013-12-12 LAB — COMPREHENSIVE METABOLIC PANEL
ALT: 32 U/L (ref 0–35)
AST: 32 U/L (ref 0–37)
Albumin: 3.9 g/dL (ref 3.5–5.2)
Alkaline Phosphatase: 67 U/L (ref 39–117)
BUN: 13 mg/dL (ref 6–23)
CO2: 22 mEq/L (ref 19–32)
Calcium: 9 mg/dL (ref 8.4–10.5)
Chloride: 102 mEq/L (ref 96–112)
Creatinine, Ser: 0.68 mg/dL (ref 0.50–1.10)
Glucose, Bld: 99 mg/dL (ref 70–99)
Potassium: 4.2 mEq/L (ref 3.5–5.3)
Sodium: 136 mEq/L (ref 135–145)
Total Bilirubin: 1.1 mg/dL (ref 0.2–1.2)
Total Protein: 6.8 g/dL (ref 6.0–8.3)

## 2013-12-12 LAB — CBC WITH DIFFERENTIAL (CANCER CENTER ONLY)
BASO#: 0.1 10*3/uL (ref 0.0–0.2)
BASO%: 0.8 % (ref 0.0–2.0)
EOS%: 1.5 % (ref 0.0–7.0)
Eosinophils Absolute: 0.1 10*3/uL (ref 0.0–0.5)
HCT: 44.5 % (ref 34.8–46.6)
HGB: 15.5 g/dL (ref 11.6–15.9)
LYMPH#: 1 10*3/uL (ref 0.9–3.3)
LYMPH%: 16.9 % (ref 14.0–48.0)
MCH: 35.1 pg — ABNORMAL HIGH (ref 26.0–34.0)
MCHC: 34.8 g/dL (ref 32.0–36.0)
MCV: 101 fL (ref 81–101)
MONO#: 0.7 10*3/uL (ref 0.1–0.9)
MONO%: 11.7 % (ref 0.0–13.0)
NEUT#: 4.3 10*3/uL (ref 1.5–6.5)
NEUT%: 69.1 % (ref 39.6–80.0)
Platelets: 154 10*3/uL (ref 145–400)
RBC: 4.42 10*6/uL (ref 3.70–5.32)
RDW: 16.6 % — ABNORMAL HIGH (ref 11.1–15.7)
WBC: 6.2 10*3/uL (ref 3.9–10.0)

## 2013-12-12 LAB — IRON AND TIBC CHCC
%SAT: 41 % (ref 21–57)
Iron: 126 ug/dL (ref 41–142)
TIBC: 304 ug/dL (ref 236–444)
UIBC: 178 ug/dL (ref 120–384)

## 2013-12-12 LAB — FERRITIN CHCC: Ferritin: 55 ng/ml (ref 9–269)

## 2013-12-12 MED ORDER — TIOTROPIUM BROMIDE MONOHYDRATE 2.5 MCG/ACT IN AERS: 2.0000 | INHALATION_SPRAY | Freq: Every day | RESPIRATORY_TRACT | Status: DC

## 2013-12-12 NOTE — Progress Notes (Signed)
[place]  [phone] [phone]  ID: [name] OB: July 15, 1945 MR#: [id number]#:[id number] Patient Care Team: Leandrew Koyanagi, MD as PCP - General (Family Medicine) Aleda Grana (Dentistry)  DIAGNOSIS: 1. 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  2. Hemochromatosis (H63D heterozygote mutation).  3. Asthma.  4. Mitochondrial myopathy.  INTERVAL HISTORY: [name] is here today for a follow-up. She is feeling much better. Her energy is up. She feels that the L-carnitine and CoQ supplements are helping her.  She has asthma and COPD and has SOB with any type of exertion. She is having issues affording her Spiriva. I will call our pharmacy and see if it is cheaper here.  She was phlebotomized last in October and did well with it. The hemochromatosis has not been a problem. In October her ferritin was 234 and iron saturation was 68%. She denies fever, chills, n/v, cough, rash, headache, dizziness, palpitations, chest pain, abdominal pain, constipation, diarrhea, blood in urine or stool. She has had no bleeding.  She has had no swelling, tenderness, numbness or tingling in her extremities.  Her appetite is good and she is trying to drink plenty of water.   CURRENT TREATMENT: 1. Phlebotomy to maintain hematocrit below 42%.  2. Aspirin 81 mg p.o. daily.  REVIEW OF SYSTEMS: All other 10 point review of systems is negative except for those issues mentioned above.   PAST MEDICAL HISTORY: Past Medical History  Diagnosis Date  . Pericarditis     age 69  . Pneumothorax   . Anxiety   . Iliotibial band syndrome     left knee  . Vitamin D deficiency   . Migraine   . Asthma   . Anemia   . IBS (irritable bowel syndrome)   . Hemorrhoids   . Cholelithiasis   . Mitochondrial myopathy   . Heart attack   . Osteoporosis   . Allergy   . Blood transfusion without reported diagnosis   . Polycythemia vera(238.4) 06/17/2012   PAST SURGICAL  HISTORY: Past Surgical History  Procedure Laterality Date  . Appendectomy    . Cesarean section      x 4  . Nasal sinus surgery    . Vein surgery    . Cardiac catheterization     FAMILY HISTORY Family History  Problem Relation Age of Onset  . Asthma Mother   . Arthritis Mother   . Depression Mother   . Cancer Father   . Emphysema Father   . Stroke Father   . Alcohol abuse Father   . Heart attack Father   . Colon cancer Neg Hx    GYNECOLOGIC HISTORY:  No LMP recorded. Patient is postmenopausal.   SOCIAL HISTORY:  History   Social History  . Marital Status: Married    Spouse Name: Richard    Number of Children: 2  . Years of Education: Grad   Occupational History  . Psychologist, educational    Social History Main Topics  . Smoking status: Never Smoker   . Smokeless tobacco: Never Used     Comment: never used tobacco  . Alcohol Use: 1.2 oz/week    2 Glasses of wine per week     Comment: occasionally  . Drug Use: No  . Sexual Activity: Not on file   Other Topics Concern  . Not on file   Social History Narrative   Health Care POA:    Emergency Contact: husband, Francene Finders, (c) [phone]   End of Life Plan:  Who lives with you: husband   Any pets: none   Diet: Pt has a varied diet.  Eats 5 sm. meals throughout day, focuses on protein, doesn't care for fruits and vegetables very much.   Exercise: Pt has a personal training and exercises several times a week.   Seatbelts: Pt reports wearing seatbelt when in vehicle.   Nancy Fetter Exposure/Protection: Pt reports wearing sun protection.    Hobbies: reading, visiting with friends   Patient has a Scientist, water quality.   Patient has two children.   Patient is retired.   Patient does not drink any caffeine.   Patient is right handed.            ADVANCED DIRECTIVES: <no information>  HEALTH MAINTENANCE: History  Substance Use Topics  . Smoking status: Never Smoker   . Smokeless tobacco: Never Used     Comment: never  used tobacco  . Alcohol Use: 1.2 oz/week    2 Glasses of wine per week     Comment: occasionally   Colonoscopy: PAP: Bone density: Lipid panel:  No Known Allergies  Current Outpatient Prescriptions  Medication Sig Dispense Refill  . albuterol (PROAIR HFA) 108 (90 BASE) MCG/ACT inhaler Inhale 2 puffs into the lungs every 4 (four) hours as needed for wheezing or shortness of breath. 1 Inhaler 3  . aspirin 81 MG tablet Take 81 mg by mouth daily.    . beclomethasone (QVAR) 80 MCG/ACT inhaler Inhale 2 puffs into the lungs 2 (two) times daily. 1 Inhaler 6  . hydrocortisone valerate cream (WESTCORT) 0.2 % Apply 1 application topically as needed.     . LevOCARNitine L-Tartrate (L-CARNITINE) 500 MG CAPS Take 2 capsules (1,000 mg total) by mouth daily. 60 capsule 4  . LORazepam (ATIVAN) 0.5 MG tablet TAKE 1 TABLET BY MOUTH AT BEDTIME 30 tablet 0  . Melatonin 3 MG TABS Take by mouth every morning.    . progesterone (PROMETRIUM) 100 MG capsule Take 100 mg by mouth daily.     . Tiotropium Bromide Monohydrate (SPIRIVA RESPIMAT) 2.5 MCG/ACT AERS Inhale 2 puffs into the lungs daily. 1 Inhaler 6  . Vitamin D, Ergocalciferol, (DRISDOL) 50000 UNITS CAPS capsule Take 50,000 Units by mouth every 7 (seven) days.    . Wheat Dextrin (CLEAR SOLUBLE FIBER) POWD Take by mouth every morning.     No current facility-administered medications for this visit.   OBJECTIVE: Filed Vitals:   12/12/13 1122  BP: 122/62  Pulse: 80  Temp: 97.6 F (36.4 C)  Resp: 14   Body mass index is 24.87 kg/(m^2). ECOG FS:0 - Asymptomatic Ocular: Sclerae unicteric, pupils equal, round and reactive to light Ear-nose-throat: Oropharynx clear, dentition fair Lymphatic: No cervical or supraclavicular adenopathy Lungs no rales or rhonchi, good excursion bilaterally Heart regular rate and rhythm, no murmur appreciated Abd soft, nontender, positive bowel sounds MSK no focal spinal tenderness, no joint edema Neuro: non-focal,  well-oriented, appropriate affect Breasts: Deferred  LAB RESULTS: CMP     Component Value Date/Time   NA 138 11/07/2013 0904   NA 142 10/12/2013 1403   K 3.7 11/07/2013 0904   K 3.9 10/12/2013 1403   CL 104 11/07/2013 0904   CL 102 10/12/2013 1403   CO2 24 11/07/2013 0904   CO2 25 10/12/2013 1403   GLUCOSE 98 11/07/2013 0904   GLUCOSE 122* 10/12/2013 1403   BUN 12 11/07/2013 0904   BUN 14 10/12/2013 1403   CREATININE 0.68 11/07/2013 0904   CREATININE 0.6 10/12/2013 1403  CALCIUM 9.5 11/07/2013 0904   CALCIUM 9.2 10/12/2013 1403   PROT 6.6 11/07/2013 0904   PROT 7.1 10/12/2013 1403   PROT 6.4 01/13/2013 1122   ALBUMIN 4.1 11/07/2013 0904   AST 30 11/07/2013 0904   AST 35 10/12/2013 1403   ALT 30 11/07/2013 0904   ALT 30 10/12/2013 1403   ALKPHOS 64 11/07/2013 0904   ALKPHOS 56 10/12/2013 1403   BILITOT 1.3* 11/07/2013 0904   BILITOT 1.50 10/12/2013 1403   GFRNONAA >60 09/28/2010 0737   GFRAA >60 09/28/2010 0737   No results found for: SPEP Lab Results  Component Value Date   WBC 6.2 12/12/2013   NEUTROABS 4.3 12/12/2013   HGB 15.5 12/12/2013   HCT 44.5 12/12/2013   MCV 101 12/12/2013   PLT 154 12/12/2013   No results found for: LABCA2 No components found for: LABCA125 No results for input(s): INR in the last 168 hours. Urinalysis No results found for: COLORURINE STUDIES: No results found.  ASSESSMENT/PLAN: [name] is a 68 year old female with polycythemia. She was last phlebotomized in October. She did well with this.  Her Hct today is 44.5 so we will phlebotomize her.  She does not have any issues with hemachromatosis. With her genetic mutation she should not retain iron.  She is able to get her Spiriva for much cheaper using the pharmacy downstairs. She is very happy about this. We will see her back in 6 weeks for labs and follow-up.  She knows to call here with any questions or concerns and to go to the ED in the event of an emergency. We can  certainly see her sooner if need be.   Eliezer Bottom, NP 12/12/2013 12:14 PM

## 2013-12-13 ENCOUNTER — Telehealth: Payer: Self-pay | Admitting: Hematology & Oncology

## 2013-12-13 NOTE — Telephone Encounter (Signed)
Mailed dec schedule

## 2014-01-03 ENCOUNTER — Other Ambulatory Visit: Payer: Self-pay | Admitting: Pulmonary Disease

## 2014-01-10 DIAGNOSIS — D510 Vitamin B12 deficiency anemia due to intrinsic factor deficiency: Secondary | ICD-10-CM | POA: Diagnosis not present

## 2014-01-20 ENCOUNTER — Other Ambulatory Visit: Payer: Self-pay | Admitting: Hematology & Oncology

## 2014-01-23 ENCOUNTER — Other Ambulatory Visit (HOSPITAL_BASED_OUTPATIENT_CLINIC_OR_DEPARTMENT_OTHER): Payer: Medicare Other | Admitting: Lab

## 2014-01-23 ENCOUNTER — Encounter: Payer: Self-pay | Admitting: Hematology & Oncology

## 2014-01-23 ENCOUNTER — Ambulatory Visit (HOSPITAL_BASED_OUTPATIENT_CLINIC_OR_DEPARTMENT_OTHER): Payer: Medicare Other | Admitting: Hematology & Oncology

## 2014-01-23 ENCOUNTER — Ambulatory Visit (HOSPITAL_BASED_OUTPATIENT_CLINIC_OR_DEPARTMENT_OTHER): Payer: Medicare Other

## 2014-01-23 VITALS — BP 121/58 | HR 79 | Temp 97.8°F | Resp 16 | Ht 62.0 in | Wt 139.0 lb

## 2014-01-23 DIAGNOSIS — D510 Vitamin B12 deficiency anemia due to intrinsic factor deficiency: Secondary | ICD-10-CM

## 2014-01-23 DIAGNOSIS — F411 Generalized anxiety disorder: Secondary | ICD-10-CM

## 2014-01-23 DIAGNOSIS — G713 Mitochondrial myopathy, not elsewhere classified: Secondary | ICD-10-CM

## 2014-01-23 DIAGNOSIS — D45 Polycythemia vera: Secondary | ICD-10-CM

## 2014-01-23 LAB — COMPREHENSIVE METABOLIC PANEL
ALT: 28 U/L (ref 0–35)
AST: 30 U/L (ref 0–37)
Albumin: 3.6 g/dL (ref 3.5–5.2)
Alkaline Phosphatase: 57 U/L (ref 39–117)
BUN: 10 mg/dL (ref 6–23)
CO2: 26 mEq/L (ref 19–32)
Calcium: 9.1 mg/dL (ref 8.4–10.5)
Chloride: 102 mEq/L (ref 96–112)
Creatinine, Ser: 0.69 mg/dL (ref 0.50–1.10)
Glucose, Bld: 102 mg/dL — ABNORMAL HIGH (ref 70–99)
Potassium: 3.8 mEq/L (ref 3.5–5.3)
Sodium: 138 mEq/L (ref 135–145)
Total Bilirubin: 1.2 mg/dL (ref 0.2–1.2)
Total Protein: 6.4 g/dL (ref 6.0–8.3)

## 2014-01-23 LAB — CBC WITH DIFFERENTIAL (CANCER CENTER ONLY)
BASO#: 0 10*3/uL (ref 0.0–0.2)
BASO%: 0.5 % (ref 0.0–2.0)
EOS%: 1.4 % (ref 0.0–7.0)
Eosinophils Absolute: 0.1 10*3/uL (ref 0.0–0.5)
HCT: 44.2 % (ref 34.8–46.6)
HGB: 15.2 g/dL (ref 11.6–15.9)
LYMPH#: 0.8 10*3/uL — ABNORMAL LOW (ref 0.9–3.3)
LYMPH%: 11.8 % — ABNORMAL LOW (ref 14.0–48.0)
MCH: 36 pg — ABNORMAL HIGH (ref 26.0–34.0)
MCHC: 34.4 g/dL (ref 32.0–36.0)
MCV: 105 fL — ABNORMAL HIGH (ref 81–101)
MONO#: 0.7 10*3/uL (ref 0.1–0.9)
MONO%: 11 % (ref 0.0–13.0)
NEUT#: 4.9 10*3/uL (ref 1.5–6.5)
NEUT%: 75.3 % (ref 39.6–80.0)
Platelets: 143 10*3/uL — ABNORMAL LOW (ref 145–400)
RBC: 4.22 10*6/uL (ref 3.70–5.32)
RDW: 11.8 % (ref 11.1–15.7)
WBC: 6.4 10*3/uL (ref 3.9–10.0)

## 2014-01-23 LAB — FERRITIN CHCC: Ferritin: 36 ng/ml (ref 9–269)

## 2014-01-23 LAB — IRON AND TIBC CHCC
%SAT: 59 % — ABNORMAL HIGH (ref 21–57)
Iron: 188 ug/dL — ABNORMAL HIGH (ref 41–142)
TIBC: 320 ug/dL (ref 236–444)
UIBC: 132 ug/dL (ref 120–384)

## 2014-01-23 NOTE — Progress Notes (Signed)
[name] presents today for phlebotomy per MD orders. Phlebotomy procedure started at 1220 and ended at 1227 500 grams removed. Patient observed for 30 minutes after procedure without any incident. Patient tolerated procedure well. IV needle removed intact.

## 2014-01-23 NOTE — Patient Instructions (Signed)

## 2014-01-23 NOTE — Progress Notes (Signed)
Hematology and Oncology Follow Up Visit  [name] [id number] 21-Apr-1945 67 y.o. 01/23/2014   Principle Diagnosis:  1. 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 2. Hemochromatosis (H63D heterozygote mutation). 3. Asthma. 4. Mitochondrial myopathy.  Current Therapy:   1. Phlebotomy to maintain hematocrit below 42%. 2. Aspirin 81 mg p.o. daily.     Interim History:  [name] is coming for followup. She's not doing as well. Unfortunately, she has more fatigue. I don't note this is the mitochondrial myopathy. I don't think she sees a family doctor area did she has not been out to [facility] for many years. They are the ones who diagnosed her. I think if I need to see her again.   I really feel that her issues are not related to any kind of hematologic issue. She keeps thinking that her anemia is caused her problems. She really is not anemic. I explained that to her at length.  Her last thyroid levels were checked a month or so ago. These were okay.   Her breathing seems to be doing a little worse. She is on some inhalers. I don't think she sees a pulmonary doctor for a month or so.   She just feels very fatigued.  We actually gave her some iron several weeks ago. I thought that this might help her issue. It really did not.  She has had no fever. Her appetite is okay. She's worried about gaining weight. Her weight today is stable.  She's had no change in bowel or bladder habits. There has been no leg swelling.  Medications: Current outpatient prescriptions: albuterol (PROAIR HFA) 108 (90 BASE) MCG/ACT inhaler, Inhale 2 puffs into the lungs every 4 (four) hours as needed for wheezing or shortness of breath., Disp: 1 Inhaler, Rfl: 3;  aspirin 81 MG tablet, Take 81 mg by mouth daily., Disp: , Rfl: ;  Estradiol-Norethindrone Acet (LOPREEZA) 0.5-0.1 MG per tablet, Take 1 tablet by mouth daily., Disp: , Rfl:  hydrocortisone valerate cream (WESTCORT) 0.2 %, Apply 1 application topically as  needed. , Disp: , Rfl: ;  LevOCARNitine L-Tartrate (L-CARNITINE) 500 MG CAPS, Take 2 capsules (1,000 mg total) by mouth daily., Disp: 60 capsule, Rfl: 4;  LORazepam (ATIVAN) 0.5 MG tablet, TAKE 1 TABLET BY MOUTH AT BEDTIME, Disp: 30 tablet, Rfl: 0;  Melatonin 3 MG TABS, Take by mouth every morning., Disp: , Rfl:  QVAR 40 MCG/ACT inhaler, INHALE 2 PUFFS INTO THE LUNGS TWICE DAILY, Disp: 8.7 g, Rfl: 0;  Tiotropium Bromide Monohydrate (SPIRIVA RESPIMAT) 2.5 MCG/ACT AERS, Inhale 2 puffs into the lungs daily., Disp: 1 Inhaler, Rfl: 6;  Vitamin D, Ergocalciferol, (DRISDOL) 50000 UNITS CAPS capsule, Take 50,000 Units by mouth every 7 (seven) days., Disp: , Rfl: ;  Wheat Dextrin (CLEAR SOLUBLE FIBER) POWD, Take by mouth every morning., Disp: , Rfl:   Allergies: No Known Allergies  Past Medical History, Surgical history, Social history, and Family History were reviewed and updated.  Review of Systems: As above  Physical Exam:  height is 5\' 2"  (1.575 m) and weight is 139 lb (63.05 kg). Her oral temperature is 97.8 F (36.6 C). Her blood pressure is 121/58 and her pulse is 79. Her respiration is 16.   Fairly well developed and well-nourished white female. Head and neck exam shows no ocular or oral lesions. There are no palpable cervical or supraclavicular lymph nodes. Lungs are clear. Cardiac exam regular rate and rhythm with no murmurs, rubs or bruits. Abdomen is soft. She has good bowel sounds.  There is no fluid wave. There is no palpable liver or spleen tip. Extremities shows no clubbing, cyanosis or edema. Neurological exam is non-focal. Skin exam no rashes.  Lab Results  Component Value Date   WBC 6.4 01/23/2014   HGB 15.2 01/23/2014   HCT 44.2 01/23/2014   MCV 105* 01/23/2014   PLT 143* 01/23/2014     Chemistry      Component Value Date/Time   NA 136 12/12/2013 1057   NA 142 10/12/2013 1403   K 4.2 12/12/2013 1057   K 3.9 10/12/2013 1403   CL 102 12/12/2013 1057   CL 102 10/12/2013 1403    CO2 22 12/12/2013 1057   CO2 25 10/12/2013 1403   BUN 13 12/12/2013 1057   BUN 14 10/12/2013 1403   CREATININE 0.68 12/12/2013 1057   CREATININE 0.6 10/12/2013 1403      Component Value Date/Time   CALCIUM 9.0 12/12/2013 1057   CALCIUM 9.2 10/12/2013 1403   ALKPHOS 67 12/12/2013 1057   ALKPHOS 56 10/12/2013 1403   AST 32 12/12/2013 1057   AST 35 10/12/2013 1403   ALT 32 12/12/2013 1057   ALT 30 10/12/2013 1403   BILITOT 1.1 12/12/2013 1057   BILITOT 1.50 10/12/2013 1403     TSH is 2.55. Ferritin 238. Iron saturation is 68%.    Impression and Plan: [name] is 68 year old female who. She has polycythemia. She has hemachromatosis. She has mitochondrial myopathy.  We will go ahead and phlebotomize her today. This may help her a little bit.  Again, I talked to her at length. I spent about 45 minutes with her. Somehow, we need to get her back to [facility] for this mitochondrial myopathy issue. I'm not sure who she is to see out there. Maybe one of the neurologists.  She is not hypothyroid.  I think the supplements or helping her a little bit.  We will plan to get her back in 6 weeks [name], MD [id number]/83/[id number]:64 PM I certainly [name] got that for

## 2014-01-31 ENCOUNTER — Other Ambulatory Visit: Payer: Self-pay | Admitting: Internal Medicine

## 2014-02-05 NOTE — Telephone Encounter (Signed)
Faxed

## 2014-02-06 DIAGNOSIS — D510 Vitamin B12 deficiency anemia due to intrinsic factor deficiency: Secondary | ICD-10-CM | POA: Diagnosis not present

## 2014-02-06 DIAGNOSIS — Z6824 Body mass index (BMI) 24.0-24.9, adult: Secondary | ICD-10-CM | POA: Diagnosis not present

## 2014-02-06 DIAGNOSIS — G713 Mitochondrial myopathy, not elsewhere classified: Secondary | ICD-10-CM | POA: Diagnosis not present

## 2014-02-06 DIAGNOSIS — E559 Vitamin D deficiency, unspecified: Secondary | ICD-10-CM | POA: Diagnosis not present

## 2014-02-11 ENCOUNTER — Encounter (HOSPITAL_COMMUNITY): Payer: Self-pay | Admitting: Emergency Medicine

## 2014-02-11 ENCOUNTER — Emergency Department (HOSPITAL_COMMUNITY)
Admission: EM | Admit: 2014-02-11 | Discharge: 2014-02-12 | Disposition: A | Discharge status: Home / Self Care | Payer: Medicare Other | Attending: Emergency Medicine | Admitting: Emergency Medicine

## 2014-02-11 DIAGNOSIS — G43909 Migraine, unspecified, not intractable, without status migrainosus: Secondary | ICD-10-CM | POA: Insufficient documentation

## 2014-02-11 DIAGNOSIS — R531 Weakness: Secondary | ICD-10-CM

## 2014-02-11 DIAGNOSIS — F419 Anxiety disorder, unspecified: Secondary | ICD-10-CM | POA: Insufficient documentation

## 2014-02-11 DIAGNOSIS — Z79899 Other long term (current) drug therapy: Secondary | ICD-10-CM | POA: Diagnosis not present

## 2014-02-11 DIAGNOSIS — Z8719 Personal history of other diseases of the digestive system: Secondary | ICD-10-CM | POA: Insufficient documentation

## 2014-02-11 DIAGNOSIS — M6281 Muscle weakness (generalized): Secondary | ICD-10-CM | POA: Diagnosis not present

## 2014-02-11 DIAGNOSIS — I252 Old myocardial infarction: Secondary | ICD-10-CM | POA: Insufficient documentation

## 2014-02-11 DIAGNOSIS — R112 Nausea with vomiting, unspecified: Secondary | ICD-10-CM | POA: Insufficient documentation

## 2014-02-11 DIAGNOSIS — J45909 Unspecified asthma, uncomplicated: Secondary | ICD-10-CM | POA: Diagnosis not present

## 2014-02-11 DIAGNOSIS — Z7952 Long term (current) use of systemic steroids: Secondary | ICD-10-CM | POA: Diagnosis not present

## 2014-02-11 DIAGNOSIS — Z862 Personal history of diseases of the blood and blood-forming organs and certain disorders involving the immune mechanism: Secondary | ICD-10-CM | POA: Diagnosis not present

## 2014-02-11 DIAGNOSIS — Z793 Long term (current) use of hormonal contraceptives: Secondary | ICD-10-CM | POA: Diagnosis not present

## 2014-02-11 DIAGNOSIS — Z7982 Long term (current) use of aspirin: Secondary | ICD-10-CM | POA: Diagnosis not present

## 2014-02-11 DIAGNOSIS — R110 Nausea: Secondary | ICD-10-CM | POA: Diagnosis not present

## 2014-02-11 DIAGNOSIS — E559 Vitamin D deficiency, unspecified: Secondary | ICD-10-CM | POA: Diagnosis not present

## 2014-02-11 DIAGNOSIS — T50904A Poisoning by unspecified drugs, medicaments and biological substances, undetermined, initial encounter: Secondary | ICD-10-CM | POA: Diagnosis not present

## 2014-02-11 MED ORDER — SODIUM CHLORIDE 0.9 % IV BOLUS (SEPSIS)
500.0000 mL | Freq: Once | INTRAVENOUS | Status: AC
  Administered 2014-02-11: 500 mL via INTRAVENOUS

## 2014-02-11 MED ORDER — ONDANSETRON HCL 4 MG/2ML IJ SOLN
4.0000 mg | Freq: Once | INTRAMUSCULAR | Status: AC
  Administered 2014-02-11: 4 mg via INTRAVENOUS
  Filled 2014-02-11: qty 2

## 2014-02-11 NOTE — ED Notes (Signed)
Patient presents from home via EMS for nausea. Patient reports nausea after eating chicken and watermelon. EMS reports patient was "weak" upon arrival.   VS: CBG 91  20g L hand

## 2014-02-11 NOTE — ED Notes (Signed)
Bed: WA24 Expected date:  Expected time:  Means of arrival:  Comments: EMS nausea after eating  Watermelon and chicken

## 2014-02-11 NOTE — ED Provider Notes (Signed)
CSN: [id number]     Arrival date & time 02/11/14  2241 History   First MD Initiated Contact with Patient 02/11/14 2306     Chief Complaint  Patient presents with  . Nausea     (Consider location/radiation/quality/duration/timing/severity/associated sxs/prior Treatment) HPI patient with history of mitochondrial myopathy presents with her husband after an episode of utilized weakness and nausea. Patient states she ate dinner and then became nauseated. Went to the bathroom and was having difficulty standing. No trauma. Denies any head or neck injury. EMS was called to help patient stand. Husband states patient was confused at this time. States she occasionally becomes confused. She is at her mental baseline currently. She describes mild nausea at present. Complains only of generalized weakness. Denies any chest pain, abdominal pain or headache. No focal weakness or numbness. No visual changes.  Past Medical History  Diagnosis Date  . Pericarditis     age 69  . Pneumothorax   . Anxiety   . Iliotibial band syndrome     left knee  . Vitamin D deficiency   . Migraine   . Asthma   . Anemia   . IBS (irritable bowel syndrome)   . Hemorrhoids   . Cholelithiasis   . Mitochondrial myopathy   . Heart attack   . Osteoporosis   . Allergy   . Blood transfusion without reported diagnosis   . Polycythemia vera(238.4) 06/17/2012   Past Surgical History  Procedure Laterality Date  . Appendectomy    . Cesarean section      x 4  . Nasal sinus surgery    . Vein surgery    . Cardiac catheterization     Family History  Problem Relation Age of Onset  . Asthma Mother   . Arthritis Mother   . Depression Mother   . Cancer Father   . Emphysema Father   . Stroke Father   . Alcohol abuse Father   . Heart attack Father   . Colon cancer Neg Hx    History  Substance Use Topics  . Smoking status: Never Smoker   . Smokeless tobacco: Never Used     Comment: never used tobacco  . Alcohol Use: 1.2  oz/week    2 Glasses of wine per week     Comment: occasionally   OB History    No data available     Review of Systems  Constitutional: Positive for fatigue. Negative for fever and chills.  Respiratory: Negative for cough and shortness of breath.   Cardiovascular: Negative for chest pain.  Gastrointestinal: Positive for nausea, vomiting and blood in stool. Negative for abdominal pain, diarrhea and constipation.  Genitourinary: Negative for dysuria and frequency.  Musculoskeletal: Negative for myalgias, back pain, neck pain and neck stiffness.  Skin: Negative for rash and wound.  Neurological: Positive for weakness (generalized). Negative for dizziness, light-headedness, numbness and headaches.  All other systems reviewed and are negative.     Allergies  Review of patient's allergies indicates no known allergies.  Home Medications   Prior to Admission medications   Medication Sig Start Date End Date Taking? Authorizing Provider  albuterol (PROAIR HFA) 108 (90 BASE) MCG/ACT inhaler Inhale 2 puffs into the lungs every 4 (four) hours as needed for wheezing or shortness of breath. 08/23/13  Yes Tammy S Parrett, NP  aspirin 81 MG tablet Take 81 mg by mouth daily.   Yes Historical Provider, MD  Estradiol-Norethindrone Acet (LOPREEZA) 0.5-0.1 MG per tablet Take 1 tablet by  mouth daily.   Yes Historical Provider, MD  hydrocortisone valerate cream (WESTCORT) 0.2 % Apply 1 application topically 2 (two) times daily as needed (itching).  07/27/11  Yes Historical Provider, MD  LevOCARNitine L-Tartrate (L-CARNITINE) 500 MG CAPS Take 2 capsules (1,000 mg total) by mouth daily. 11/07/13  Yes [name], MD  LORazepam (ATIVAN) 0.5 MG tablet TAKE 1 TABLET BY MOUTH EVERY NIGHT AT BEDTIME 02/02/14  Yes Leandrew Koyanagi, MD  Melatonin 3 MG TABS Take 3 mg by mouth at bedtime.    Yes Historical Provider, MD  QVAR 80 MCG/ACT inhaler Inhale 2 puffs into the lungs 2 (two) times daily.  01/30/14  Yes  Historical Provider, MD  Tiotropium Bromide Monohydrate (SPIRIVA RESPIMAT) 2.5 MCG/ACT AERS Inhale 2 puffs into the lungs daily. 12/12/13  Yes Eliezer Bottom, NP  Wheat Dextrin (CLEAR SOLUBLE FIBER) POWD Take 5 mLs by mouth every morning.    Yes Historical Provider, MD  ondansetron (ZOFRAN ODT) 4 MG disintegrating tablet 4mg  ODT q4 hours prn nausea/vomit 02/12/14   Julianne Rice, MD  QVAR 40 MCG/ACT inhaler INHALE 2 PUFFS INTO THE LUNGS TWICE DAILY Patient not taking: Reported on 02/11/2014 01/20/14   [name], MD  Vitamin D, Ergocalciferol, (DRISDOL) 50000 UNITS CAPS capsule Take 50,000 Units by mouth every Tuesday.     Historical Provider, MD   BP 117/54 mmHg  Pulse 102  Temp(Src) 97.6 F (36.4 C) (Oral)  Resp 28  Ht 5\' 4"  (1.626 m)  Wt 140 lb (63.504 kg)  BMI 24.02 kg/m2  SpO2 92% Physical Exam  Constitutional: She is oriented to person, place, and time. She appears well-developed and well-nourished. No distress.  HENT:  Head: Normocephalic and atraumatic.  Mouth/Throat: Oropharynx is clear and moist. No oropharyngeal exudate.  Eyes: EOM are normal. Pupils are equal, round, and reactive to light.  Neck: Normal range of motion. Neck supple.  Cardiovascular: Normal rate and regular rhythm.  Exam reveals no gallop and no friction rub.   No murmur heard. Pulmonary/Chest: Effort normal and breath sounds normal. No respiratory distress. She has no wheezes. She has no rales. She exhibits no tenderness.  Abdominal: Soft. Bowel sounds are normal. She exhibits no distension and no mass. There is no tenderness. There is no rebound and no guarding.  Musculoskeletal: Normal range of motion. She exhibits no edema or tenderness.  Neurological: She is alert and oriented to person, place, and time.  4/5 motor in all extremities. Sensation is intact.  Skin: Skin is warm and dry. No rash noted. No erythema.  Psychiatric: She has a normal mood and affect. Her behavior is normal.  Nursing  note and vitals reviewed.   ED Course  Procedures (including critical care time) Labs Review Labs Reviewed  CBC WITH DIFFERENTIAL - Abnormal; Notable for the following:    MCV 104.3 (*)    MCH 35.3 (*)    Neutrophils Relative % 82 (*)    Lymphocytes Relative 10 (*)    All other components within normal limits  COMPREHENSIVE METABOLIC PANEL - Abnormal; Notable for the following:    Sodium 134 (*)    Glucose, Bld 104 (*)    Calcium 8.1 (*)    All other components within normal limits  URINALYSIS, ROUTINE W REFLEX MICROSCOPIC - Abnormal; Notable for the following:    APPearance CLOUDY (*)    Specific Gravity, Urine 1.003 (*)    All other components within normal limits  TROPONIN I  LIPASE, BLOOD  Imaging Review No results found.   EKG Interpretation   Date/Time:  Sunday February 11 2014 23:48:00 EST Ventricular Rate:  88 PR Interval:  145 QRS Duration: 91 QT Interval:  373 QTC Calculation: 451 R Axis:   29 Text Interpretation:  Sinus rhythm RSR' in V1 or V2, right VCD or RVH  Confirmed by Lita Mains  MD, Cannon Arreola ([id number]) on 02/11/2014 11:56:12 PM      MDM   Final diagnoses:  Nausea  Generalized weakness      No vomiting in the emergency department. Weakness is improved. Patient is ambulating. Workup essentially negative. Advised follow-up with primary M.D. Return precautions have been given.  Julianne Rice, MD 02/12/14 [phone]

## 2014-02-12 DIAGNOSIS — R112 Nausea with vomiting, unspecified: Secondary | ICD-10-CM | POA: Diagnosis not present

## 2014-02-12 LAB — URINALYSIS, ROUTINE W REFLEX MICROSCOPIC
Bilirubin Urine: NEGATIVE
Glucose, UA: NEGATIVE mg/dL
Hgb urine dipstick: NEGATIVE
Ketones, ur: NEGATIVE mg/dL
Leukocytes, UA: NEGATIVE
Nitrite: NEGATIVE
Protein, ur: NEGATIVE mg/dL
Specific Gravity, Urine: 1.003 — ABNORMAL LOW (ref 1.005–1.030)
Urobilinogen, UA: 0.2 mg/dL (ref 0.0–1.0)
pH: 6 (ref 5.0–8.0)

## 2014-02-12 LAB — COMPREHENSIVE METABOLIC PANEL
ALT: 28 U/L (ref 0–35)
AST: 31 U/L (ref 0–37)
Albumin: 3.6 g/dL (ref 3.5–5.2)
Alkaline Phosphatase: 59 U/L (ref 39–117)
Anion gap: 7 (ref 5–15)
BUN: 11 mg/dL (ref 6–23)
CO2: 26 mmol/L (ref 19–32)
Calcium: 8.1 mg/dL — ABNORMAL LOW (ref 8.4–10.5)
Chloride: 101 mEq/L (ref 96–112)
Creatinine, Ser: 0.54 mg/dL (ref 0.50–1.10)
GFR calc Af Amer: >90 mL/min (ref >90)
GFR calc non Af Amer: >90 mL/min (ref >90)
Glucose, Bld: 104 mg/dL — ABNORMAL HIGH (ref 70–99)
Potassium: 3.5 mmol/L (ref 3.5–5.1)
Sodium: 134 mmol/L — ABNORMAL LOW (ref 135–145)
Total Bilirubin: 0.8 mg/dL (ref 0.3–1.2)
Total Protein: 6.7 g/dL (ref 6.0–8.3)

## 2014-02-12 LAB — CBC WITH DIFFERENTIAL/PLATELET
Basophils Absolute: 0 K/uL (ref 0.0–0.1)
Basophils Relative: 0 % (ref 0–1)
Eosinophils Absolute: 0 K/uL (ref 0.0–0.7)
Eosinophils Relative: 0 % (ref 0–5)
HCT: 41.1 % (ref 36.0–46.0)
Hemoglobin: 13.9 g/dL (ref 12.0–15.0)
Lymphocytes Relative: 10 % — ABNORMAL LOW (ref 12–46)
Lymphs Abs: 0.8 K/uL (ref 0.7–4.0)
MCH: 35.3 pg — ABNORMAL HIGH (ref 26.0–34.0)
MCHC: 33.8 g/dL (ref 30.0–36.0)
MCV: 104.3 fL — ABNORMAL HIGH (ref 78.0–100.0)
Monocytes Absolute: 0.5 K/uL (ref 0.1–1.0)
Monocytes Relative: 7 % (ref 3–12)
Neutro Abs: 6.3 K/uL (ref 1.7–7.7)
Neutrophils Relative %: 82 % — ABNORMAL HIGH (ref 43–77)
Platelets: 167 K/uL (ref 150–400)
RBC: 3.94 MIL/uL (ref 3.87–5.11)
RDW: 12.2 % (ref 11.5–15.5)
WBC: 7.6 K/uL (ref 4.0–10.5)

## 2014-02-12 LAB — LIPASE, BLOOD: Lipase: 28 U/L (ref 11–59)

## 2014-02-12 LAB — TROPONIN I: Troponin I: <0.03 ng/mL (ref <0.031)

## 2014-02-12 MED ORDER — SODIUM CHLORIDE 0.9 % IV BOLUS (SEPSIS)
500.0000 mL | Freq: Once | INTRAVENOUS | Status: AC
  Administered 2014-02-12: 500 mL via INTRAVENOUS

## 2014-02-12 MED ORDER — ONDANSETRON 4 MG PO TBDP: ORAL_TABLET | ORAL | Status: DC

## 2014-02-12 MED ORDER — ONDANSETRON HCL 4 MG/2ML IJ SOLN
4.0000 mg | Freq: Once | INTRAMUSCULAR | Status: AC
  Administered 2014-02-12: 4 mg via INTRAVENOUS
  Filled 2014-02-12: qty 2

## 2014-02-12 NOTE — Discharge Instructions (Signed)
Nausea, Adult Nausea is the feeling that you have an upset stomach or have to vomit. Nausea by itself is not likely a serious concern, but it may be an early sign of more serious medical problems. As nausea gets worse, it can lead to vomiting. If vomiting develops, there is the risk of dehydration.  CAUSES   Viral infections.  Food poisoning.  Medicines.  Pregnancy.  Motion sickness.  Migraine headaches.  Emotional distress.  Severe pain from any source.  Alcohol intoxication. HOME CARE INSTRUCTIONS  Get plenty of rest.  Ask your caregiver about specific rehydration instructions.  Eat small amounts of food and sip liquids more often.  Take all medicines as told by your caregiver. SEEK MEDICAL CARE IF:  You have not improved after 2 days, or you get worse.  You have a headache. SEEK IMMEDIATE MEDICAL CARE IF:   You have a fever.  You faint.  You keep vomiting or have blood in your vomit.  You are extremely weak or dehydrated.  You have dark or bloody stools.  You have severe chest or abdominal pain. MAKE SURE YOU:  Understand these instructions.  Will watch your condition.  Will get help right away if you are not doing well or get worse. Document Released: 02/20/2004 Document Revised: 10/07/2011 Document Reviewed: 09/24/2010 [facility] Patient Information 2015 [place], [place]. This information is not intended to replace advice given to you by your health care provider. Make sure you discuss any questions you have with your health care provider.  Weakness Weakness is a lack of strength. It may be felt all over the body (generalized) or in one specific part of the body (focal). Some causes of weakness can be serious. You may need further medical evaluation, especially if you are elderly or you have a history of immunosuppression (such as chemotherapy or HIV), kidney disease, heart disease, or diabetes. CAUSES  Weakness can be caused by many different things,  including:  Infection.  Physical exhaustion.  Internal bleeding or other blood loss that results in a lack of red blood cells (anemia).  Dehydration. This cause is more common in elderly people.  Side effects or electrolyte abnormalities from medicines, such as pain medicines or sedatives.  Emotional distress, anxiety, or depression.  Circulation problems, especially severe peripheral arterial disease.  Heart disease, such as rapid atrial fibrillation, bradycardia, or heart failure.  Nervous system disorders, such as Guillain-Barr syndrome, multiple sclerosis, or stroke. DIAGNOSIS  To find the cause of your weakness, your caregiver will take your history and perform a physical exam. Lab tests or X-rays may also be ordered, if needed. TREATMENT  Treatment of weakness depends on the cause of your symptoms and can vary greatly. HOME CARE INSTRUCTIONS   Rest as needed.  Eat a well-balanced diet.  Try to get some exercise every day.  Only take over-the-counter or prescription medicines as directed by your caregiver. SEEK MEDICAL CARE IF:   Your weakness seems to be getting worse or spreads to other parts of your body.  You develop new aches or pains. SEEK IMMEDIATE MEDICAL CARE IF:   You cannot perform your normal daily activities, such as getting dressed and feeding yourself.  You cannot walk up and down stairs, or you feel exhausted when you do so.  You have shortness of breath or chest pain.  You have difficulty moving parts of your body.  You have weakness in only one area of the body or on only one side of the body.  You have a fever.  You have trouble speaking or swallowing.  You cannot control your bladder or bowel movements.  You have black or bloody vomit or stools. MAKE SURE YOU:  Understand these instructions.  Will watch your condition.  Will get help right away if you are not doing well or get worse. Document Released: 01/12/2005 Document  Revised: 07/14/2011 Document Reviewed: 03/13/2011 [facility] Patient Information 2015 [place], [place]. This information is not intended to replace advice given to you by your health care provider. Make sure you discuss any questions you have with your health care provider.

## 2014-02-14 ENCOUNTER — Telehealth: Payer: Self-pay | Admitting: Pulmonary Disease

## 2014-02-14 MED ORDER — QVAR 80 MCG/ACT IN AERS: 2.0000 | INHALATION_SPRAY | Freq: Two times a day (BID) | RESPIRATORY_TRACT | Status: DC

## 2014-02-14 NOTE — Telephone Encounter (Signed)
Spoke with patient-she is aware to contact [facility] again letting them know she has not received her sample to replace bad inhaler. In the meantime we have a sample she can have to help her through this time. Sample left at front and pt aware. Nothing more needed at this time.

## 2014-02-26 ENCOUNTER — Telehealth: Payer: Self-pay | Admitting: *Deleted

## 2014-02-26 NOTE — Telephone Encounter (Signed)
Patient called stating she had some phlebitis to her arm. Its warm with some redness and slight pain. No fever. She already has an appointment scheduled tomorrow. Dr Marin Olp notified and no changes made to her schedules. We will assess tomorrow am.

## 2014-02-27 ENCOUNTER — Ambulatory Visit (HOSPITAL_BASED_OUTPATIENT_CLINIC_OR_DEPARTMENT_OTHER): Payer: Medicare Other | Admitting: Family

## 2014-02-27 ENCOUNTER — Ambulatory Visit (HOSPITAL_BASED_OUTPATIENT_CLINIC_OR_DEPARTMENT_OTHER): Payer: Medicare Other

## 2014-02-27 ENCOUNTER — Other Ambulatory Visit (HOSPITAL_BASED_OUTPATIENT_CLINIC_OR_DEPARTMENT_OTHER): Payer: Medicare Other | Admitting: Lab

## 2014-02-27 ENCOUNTER — Encounter: Payer: Self-pay | Admitting: Family

## 2014-02-27 VITALS — BP 111/52 | HR 82

## 2014-02-27 DIAGNOSIS — D510 Vitamin B12 deficiency anemia due to intrinsic factor deficiency: Secondary | ICD-10-CM

## 2014-02-27 DIAGNOSIS — F411 Generalized anxiety disorder: Secondary | ICD-10-CM | POA: Diagnosis not present

## 2014-02-27 DIAGNOSIS — D45 Polycythemia vera: Secondary | ICD-10-CM

## 2014-02-27 DIAGNOSIS — F419 Anxiety disorder, unspecified: Secondary | ICD-10-CM | POA: Diagnosis not present

## 2014-02-27 LAB — FERRITIN CHCC: Ferritin: 20 ng/ml (ref 9–269)

## 2014-02-27 LAB — CBC WITH DIFFERENTIAL (CANCER CENTER ONLY)
BASO#: 0 10*3/uL (ref 0.0–0.2)
BASO%: 0.5 % (ref 0.0–2.0)
EOS%: 1.2 % (ref 0.0–7.0)
Eosinophils Absolute: 0.1 10*3/uL (ref 0.0–0.5)
HCT: 43.8 % (ref 34.8–46.6)
HGB: 14.8 g/dL (ref 11.6–15.9)
LYMPH#: 0.9 10*3/uL (ref 0.9–3.3)
LYMPH%: 12.7 % — ABNORMAL LOW (ref 14.0–48.0)
MCH: 34.5 pg — ABNORMAL HIGH (ref 26.0–34.0)
MCHC: 33.8 g/dL (ref 32.0–36.0)
MCV: 102 fL — ABNORMAL HIGH (ref 81–101)
MONO#: 0.7 10*3/uL (ref 0.1–0.9)
MONO%: 10.1 % (ref 0.0–13.0)
NEUT#: 5.6 10*3/uL (ref 1.5–6.5)
NEUT%: 75.5 % (ref 39.6–80.0)
Platelets: 171 10*3/uL (ref 145–400)
RBC: 4.29 10*6/uL (ref 3.70–5.32)
RDW: 11.8 % (ref 11.1–15.7)
WBC: 7.4 10*3/uL (ref 3.9–10.0)

## 2014-02-27 LAB — CMP (CANCER CENTER ONLY)
ALT(SGPT): 23 U/L (ref 10–47)
AST: 29 U/L (ref 11–38)
Albumin: 3.2 g/dL — ABNORMAL LOW (ref 3.3–5.5)
Alkaline Phosphatase: 56 U/L (ref 26–84)
BUN, Bld: 12 mg/dL (ref 7–22)
CO2: 24 mEq/L (ref 18–33)
Calcium: 8.9 mg/dL (ref 8.0–10.3)
Chloride: 103 mEq/L (ref 98–108)
Creat: 0.3 mg/dl — ABNORMAL LOW (ref 0.6–1.2)
Glucose, Bld: 105 mg/dL (ref 73–118)
Potassium: 3.9 mEq/L (ref 3.3–4.7)
Sodium: 139 mEq/L (ref 128–145)
Total Bilirubin: 1.1 mg/dl (ref 0.20–1.60)
Total Protein: 7 g/dL (ref 6.4–8.1)

## 2014-02-27 LAB — IRON AND TIBC CHCC
%SAT: 17 % — ABNORMAL LOW (ref 21–57)
Iron: 62 ug/dL (ref 41–142)
TIBC: 359 ug/dL (ref 236–444)
UIBC: 297 ug/dL (ref 120–384)

## 2014-02-27 MED ORDER — SODIUM CHLORIDE 0.9 % IV SOLN: 500.0000 mL | INTRAVENOUS | Status: DC

## 2014-02-27 MED ORDER — SODIUM CHLORIDE 0.9 % IV SOLN
750.0000 mL | Freq: Once | INTRAVENOUS | Status: AC
  Administered 2014-02-27: 750 mL via INTRAVENOUS

## 2014-02-27 NOTE — Patient Instructions (Signed)

## 2014-02-27 NOTE — Progress Notes (Signed)
[place]  [phone] [phone]  ID: [name] OB: 10-Jan-1946 MR#: [id number] IRS#:[id number] Patient Care Team: Leandrew Koyanagi, MD as PCP - General (Family Medicine) Aleda Grana (Dentistry)  DIAGNOSIS: 1. 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  2. Hemochromatosis (H63D heterozygote mutation).  3. Mitochondrial myopathy.  INTERVAL HISTORY: [name] is here today for a follow-up. She is feeling much better but had some issues after her last phlebotomy. She had fatigue for a week and struggled to get out of bed. We will give her fluids after her phlebotomy today and see if this helps.  She has a follow-up appointment with [facility] next month. She was seen by them in the the past and they are actually the ones who diagnosed her with polycythemia vera.  She denies fever, chills, n/v, cough, rash, headache, dizziness, palpitations, chest pain, abdominal pain, constipation, diarrhea, blood in urine or stool. She has had no bleeding. She has asthma and COPD and has SOB with exertion. She uses an inhaler and this helps her.  She has had no swelling, tenderness, numbness or tingling in her extremities.  Her appetite is good and she is trying to drink plenty of water. Her weight is stable at 139 lbs.   CURRENT TREATMENT: 1. Phlebotomy to maintain hematocrit below 42%.  2. Aspirin 81 mg p.o. daily.  REVIEW OF SYSTEMS: All other 10 point review of systems is negative except for those issues mentioned above.   PAST MEDICAL HISTORY: Past Medical History  Diagnosis Date  . Pericarditis     age 49  . Pneumothorax   . Anxiety   . Iliotibial band syndrome     left knee  . Vitamin D deficiency   . Migraine   . Asthma   . Anemia   . IBS (irritable bowel syndrome)   . Hemorrhoids   . Cholelithiasis   . Mitochondrial myopathy   . Heart attack   . Osteoporosis   . Allergy   . Blood transfusion without reported diagnosis   . Polycythemia vera(238.4)  06/17/2012   PAST SURGICAL HISTORY: Past Surgical History  Procedure Laterality Date  . Appendectomy    . Cesarean section      x 4  . Nasal sinus surgery    . Vein surgery    . Cardiac catheterization     FAMILY HISTORY Family History  Problem Relation Age of Onset  . Asthma Mother   . Arthritis Mother   . Depression Mother   . Cancer Father   . Emphysema Father   . Stroke Father   . Alcohol abuse Father   . Heart attack Father   . Colon cancer Neg Hx    GYNECOLOGIC HISTORY:  No LMP recorded. Patient is postmenopausal.   SOCIAL HISTORY:  History   Social History  . Marital Status: Married    Spouse Name: Richard    Number of Children: 2  . Years of Education: Grad   Occupational History  . Psychologist, educational    Social History Main Topics  . Smoking status: Never Smoker   . Smokeless tobacco: Never Used     Comment: never used tobacco  . Alcohol Use: 1.2 oz/week    2 Glasses of wine per week     Comment: occasionally  . Drug Use: No  . Sexual Activity: Not on file   Other Topics Concern  . Not on file   Social History Narrative   Health Care POA:    Emergency Contact:  husband, Francene Finders, (c) [phone]   End of Life Plan:    Who lives with you: husband   Any pets: none   Diet: Pt has a varied diet.  Eats 5 sm. meals throughout day, focuses on protein, doesn't care for fruits and vegetables very much.   Exercise: Pt has a personal training and exercises several times a week.   Seatbelts: Pt reports wearing seatbelt when in vehicle.   Nancy Fetter Exposure/Protection: Pt reports wearing sun protection.    Hobbies: reading, visiting with friends   Patient has a Scientist, water quality.   Patient has two children.   Patient is retired.   Patient does not drink any caffeine.   Patient is right handed.            ADVANCED DIRECTIVES: <no information>  HEALTH MAINTENANCE: History  Substance Use Topics  . Smoking status: Never Smoker   . Smokeless tobacco: Never  Used     Comment: never used tobacco  . Alcohol Use: 1.2 oz/week    2 Glasses of wine per week     Comment: occasionally   Colonoscopy: PAP: Bone density: Lipid panel:  No Known Allergies  Current Outpatient Prescriptions  Medication Sig Dispense Refill  . albuterol (PROAIR HFA) 108 (90 BASE) MCG/ACT inhaler Inhale 2 puffs into the lungs every 4 (four) hours as needed for wheezing or shortness of breath. 1 Inhaler 3  . aspirin 81 MG tablet Take 81 mg by mouth daily.    . Estradiol-Norethindrone Acet (LOPREEZA) 0.5-0.1 MG per tablet Take 1 tablet by mouth daily.    . hydrocortisone valerate cream (WESTCORT) 0.2 % Apply 1 application topically 2 (two) times daily as needed (itching).     . LevOCARNitine L-Tartrate (L-CARNITINE) 500 MG CAPS Take 2 capsules (1,000 mg total) by mouth daily. 60 capsule 4  . LORazepam (ATIVAN) 0.5 MG tablet TAKE 1 TABLET BY MOUTH EVERY NIGHT AT BEDTIME 30 tablet 0  . Melatonin 3 MG TABS Take 3 mg by mouth at bedtime.     Marland Kitchen QVAR 80 MCG/ACT inhaler Inhale 2 puffs into the lungs 2 (two) times daily. 1 Inhaler 0  . Tiotropium Bromide Monohydrate (SPIRIVA RESPIMAT) 2.5 MCG/ACT AERS Inhale 2 puffs into the lungs daily. 1 Inhaler 6  . Vitamin D, Ergocalciferol, (DRISDOL) 50000 UNITS CAPS capsule Take 50,000 Units by mouth every Tuesday.     . Wheat Dextrin (CLEAR SOLUBLE FIBER) POWD Take 5 mLs by mouth every morning.     . ondansetron (ZOFRAN ODT) 4 MG disintegrating tablet 4mg  ODT q4 hours prn nausea/vomit (Patient not taking: Reported on 02/27/2014) 8 tablet 0   Current Facility-Administered Medications  Medication Dose Route Frequency Provider Last Rate Last Dose  . 0.9 %  sodium chloride infusion  500 mL Intravenous Continuous Eliezer Bottom, NP       OBJECTIVE: Filed Vitals:   02/27/14 1201  BP: 119/58  Pulse: 82  Temp: 97.9 F (36.6 C)  Resp: 14   Body mass index is 25.42 kg/(m^2). ECOG FS:0 - Asymptomatic Ocular: Sclerae unicteric, pupils equal,  round and reactive to light Ear-nose-throat: Oropharynx clear, dentition fair Lymphatic: No cervical or supraclavicular adenopathy Lungs no rales or rhonchi, good excursion bilaterally Heart regular rate and rhythm, no murmur appreciated Abd soft, nontender, positive bowel sounds MSK no focal spinal tenderness, no joint edema Neuro: non-focal, well-oriented, appropriate affect Breasts: Deferred  LAB RESULTS: CMP     Component Value Date/Time   NA 139 02/27/2014 1105  NA 134* 02/11/2014 2357   K 3.9 02/27/2014 1105   K 3.5 02/11/2014 2357   CL 103 02/27/2014 1105   CL 101 02/11/2014 2357   CO2 24 02/27/2014 1105   CO2 26 02/11/2014 2357   GLUCOSE 105 02/27/2014 1105   GLUCOSE 104* 02/11/2014 2357   BUN 12 02/27/2014 1105   BUN 11 02/11/2014 2357   CREATININE 0.3* 02/27/2014 1105   CREATININE 0.54 02/11/2014 2357   CALCIUM 8.9 02/27/2014 1105   CALCIUM 8.1* 02/11/2014 2357   PROT 7.0 02/27/2014 1105   PROT 6.7 02/11/2014 2357   PROT 6.4 01/13/2013 1122   ALBUMIN 3.6 02/11/2014 2357   AST 29 02/27/2014 1105   AST 31 02/11/2014 2357   ALT 23 02/27/2014 1105   ALT 28 02/11/2014 2357   ALKPHOS 56 02/27/2014 1105   ALKPHOS 59 02/11/2014 2357   BILITOT 1.10 02/27/2014 1105   BILITOT 0.8 02/11/2014 2357   GFRNONAA >90 02/11/2014 2357   GFRAA >90 02/11/2014 2357   No results found for: SPEP Lab Results  Component Value Date   WBC 7.4 02/27/2014   NEUTROABS 5.6 02/27/2014   HGB 14.8 02/27/2014   HCT 43.8 02/27/2014   MCV 102* 02/27/2014   PLT 171 02/27/2014   No results found for: LABCA2 No components found for: LABCA125 No results for input(s): INR in the last 168 hours. Urinalysis    Component Value Date/Time   COLORURINE YELLOW 02/12/2014 0350   STUDIES: No results found.  ASSESSMENT/PLAN: Ms. Landers is a 69 year old female with polycythemia vera and hemochromatosis. The hemachromatosis has not been a problem for her.  She had fatigue after her last  phlebotomy Her Hct today is 43.8 so we will phlebotomize her. We will also give her some fluids after. Hopefully this will help her not feel so tired afterwards.   We will see her back in 5 weeks for labs and follow-up.  She knows to call here with any questions or concerns and to go to the ED in the event of an emergency. We can certainly see her sooner if need be.   Eliezer Bottom, NP 02/27/2014 12:52 PM

## 2014-02-27 NOTE — Progress Notes (Signed)
[name] presents today for phlebotomy per MD orders. Phlebotomy procedure started at 1240 and ended at 1305. 500 grams removed using 20g IV angiocath. Patient observed for 60 minutes after procedure while receiving 1L IVF without any incident. Patient tolerated procedure well. IV needle removed intact.

## 2014-02-28 LAB — IGG, IGA, IGM
IgA: 339 mg/dL (ref 69–380)
IgG (Immunoglobin G), Serum: 979 mg/dL (ref 690–1700)
IgM, Serum: 129 mg/dL (ref 52–322)

## 2014-03-01 DIAGNOSIS — D510 Vitamin B12 deficiency anemia due to intrinsic factor deficiency: Secondary | ICD-10-CM | POA: Diagnosis not present

## 2014-03-13 ENCOUNTER — Ambulatory Visit: Payer: Medicare Other | Admitting: Hematology & Oncology

## 2014-03-13 ENCOUNTER — Other Ambulatory Visit: Payer: Medicare Other | Admitting: Lab

## 2014-03-20 ENCOUNTER — Ambulatory Visit (INDEPENDENT_AMBULATORY_CARE_PROVIDER_SITE_OTHER): Payer: Medicare Other | Admitting: Pulmonary Disease

## 2014-03-20 ENCOUNTER — Encounter: Payer: Self-pay | Admitting: Pulmonary Disease

## 2014-03-20 VITALS — BP 100/68 | HR 86 | Temp 97.9°F | Ht 64.0 in | Wt 141.8 lb

## 2014-03-20 DIAGNOSIS — J449 Chronic obstructive pulmonary disease, unspecified: Secondary | ICD-10-CM | POA: Diagnosis not present

## 2014-03-20 NOTE — Assessment & Plan Note (Signed)
The patient is having increased shortness of breath recently, and is having to use her rescue inhaler more frequently. However, she is unsure if even this makes a big difference to her breathing. She has some type of neuromuscular weakness, and is due for a consultation at [facility] in the near future. Perhaps this is the reason for her shortness of breath more so than her airways disease. Since she does not want to take long-acting beta agonist, I would like for her to try and take Spiriva at the therapeutic dose rather than half therapeutic dose. I will try to give her samples to help with the cost until we can see if this makes a big difference to her breathing. If it does not, I can't try her on various LABA's if she is willing to take to see if this will make a difference. In the end, her symptoms may be more related to her weakness than anything else.

## 2014-03-20 NOTE — Patient Instructions (Signed)
Continue on qvar twice a day Take the spiriva 2 puffs once a day to see if the spiriva helps you or not for the next 4 weeks.  If it does, then stay on spiriva.  If not, please call me so we can discuss trying something else.  Keep your apptm with [facility] for evaluation of your muscle weakness.  If you are doing well, followup with me again in 62mos

## 2014-03-20 NOTE — Progress Notes (Signed)
   Subjective:    Patient ID: [name], female    DOB: Oct 15, 1945, 69 y.o.   MRN: [id number]  HPI Patient comes in today for follow-up of her known chronic obstructive asthma. He has been maintained on Qvar, and at the last visit we tried Spiriva Respimat. Because of expense, she has been using half dose, and most recently has been having issues with increasing shortness of breath. She has been using her rescue inhaler excessively, but is unsure if this helps. She has not wanted to use LABA's because of side effects. He should also be kept in mind that she has some type of neuromuscular disease which appears to be progressing, wraps this is the cause of her dyspnea. She is scheduled to be evaluated at [facility] in the near future.   Review of Systems  Constitutional: Negative for fever and unexpected weight change.  HENT: Positive for congestion, postnasal drip and sinus pressure. Negative for dental problem, ear pain, nosebleeds, rhinorrhea, sneezing, sore throat and trouble swallowing.   Eyes: Negative for redness and itching.  Respiratory: Positive for cough, chest tightness and shortness of breath. Negative for wheezing.   Cardiovascular: Negative for palpitations and leg swelling.  Gastrointestinal: Negative for nausea and vomiting.  Genitourinary: Negative for dysuria.  Musculoskeletal: Negative for joint swelling.  Skin: Negative for rash.  Neurological: Negative for headaches.  Hematological: Does not bruise/bleed easily.  Psychiatric/Behavioral: Negative for dysphoric mood. The patient is not nervous/anxious.        Objective:   Physical Exam Well-developed female in no acute distress Nose without purulence or discharge noted Neck without lymphadenopathy or thyromegaly Chest with clear breath sounds, no wheezing Cardiac exam with regular rate and rhythm Lower extremities without significant edema, no cyanosis Alert and oriented, moves all 4 extremities.          Assessment & Plan:

## 2014-03-28 DIAGNOSIS — R9431 Abnormal electrocardiogram [ECG] [EKG]: Secondary | ICD-10-CM | POA: Diagnosis not present

## 2014-03-28 DIAGNOSIS — H359 Unspecified retinal disorder: Secondary | ICD-10-CM | POA: Diagnosis not present

## 2014-03-28 DIAGNOSIS — R251 Tremor, unspecified: Secondary | ICD-10-CM | POA: Diagnosis not present

## 2014-03-28 DIAGNOSIS — D510 Vitamin B12 deficiency anemia due to intrinsic factor deficiency: Secondary | ICD-10-CM | POA: Diagnosis not present

## 2014-03-28 DIAGNOSIS — G729 Myopathy, unspecified: Secondary | ICD-10-CM | POA: Diagnosis not present

## 2014-03-28 DIAGNOSIS — G713 Mitochondrial myopathy, not elsewhere classified: Secondary | ICD-10-CM | POA: Diagnosis not present

## 2014-03-28 DIAGNOSIS — I498 Other specified cardiac arrhythmias: Secondary | ICD-10-CM | POA: Diagnosis not present

## 2014-03-28 DIAGNOSIS — R531 Weakness: Secondary | ICD-10-CM | POA: Diagnosis not present

## 2014-04-03 ENCOUNTER — Encounter: Payer: Self-pay | Admitting: Hematology & Oncology

## 2014-04-03 ENCOUNTER — Other Ambulatory Visit (HOSPITAL_BASED_OUTPATIENT_CLINIC_OR_DEPARTMENT_OTHER): Payer: Medicare Other | Admitting: Lab

## 2014-04-03 ENCOUNTER — Ambulatory Visit (HOSPITAL_BASED_OUTPATIENT_CLINIC_OR_DEPARTMENT_OTHER): Payer: Medicare Other | Admitting: Hematology & Oncology

## 2014-04-03 VITALS — BP 122/67 | HR 86 | Temp 98.0°F | Resp 14 | Ht 64.0 in | Wt 140.0 lb

## 2014-04-03 DIAGNOSIS — D45 Polycythemia vera: Secondary | ICD-10-CM

## 2014-04-03 DIAGNOSIS — D510 Vitamin B12 deficiency anemia due to intrinsic factor deficiency: Secondary | ICD-10-CM

## 2014-04-03 LAB — CMP (CANCER CENTER ONLY)
ALT(SGPT): 28 U/L (ref 10–47)
AST: 36 U/L (ref 11–38)
Albumin: 3.2 g/dL — ABNORMAL LOW (ref 3.3–5.5)
Alkaline Phosphatase: 61 U/L (ref 26–84)
BUN, Bld: 10 mg/dL (ref 7–22)
CO2: 27 mEq/L (ref 18–33)
Calcium: 8.9 mg/dL (ref 8.0–10.3)
Chloride: 99 mEq/L (ref 98–108)
Creat: 0.7 mg/dl (ref 0.6–1.2)
Glucose, Bld: 98 mg/dL (ref 73–118)
Potassium: 4.1 mEq/L (ref 3.3–4.7)
Sodium: 137 mEq/L (ref 128–145)
Total Bilirubin: 1 mg/dl (ref 0.20–1.60)
Total Protein: 7.5 g/dL (ref 6.4–8.1)

## 2014-04-03 LAB — CBC WITH DIFFERENTIAL (CANCER CENTER ONLY)
BASO#: 0 10*3/uL (ref 0.0–0.2)
BASO%: 0.4 % (ref 0.0–2.0)
EOS%: 1.5 % (ref 0.0–7.0)
Eosinophils Absolute: 0.1 10*3/uL (ref 0.0–0.5)
HCT: 42.6 % (ref 34.8–46.6)
HGB: 14 g/dL (ref 11.6–15.9)
LYMPH#: 0.8 10*3/uL — ABNORMAL LOW (ref 0.9–3.3)
LYMPH%: 11.9 % — ABNORMAL LOW (ref 14.0–48.0)
MCH: 32.3 pg (ref 26.0–34.0)
MCHC: 32.9 g/dL (ref 32.0–36.0)
MCV: 98 fL (ref 81–101)
MONO#: 0.6 10*3/uL (ref 0.1–0.9)
MONO%: 8.6 % (ref 0.0–13.0)
NEUT#: 5.3 10*3/uL (ref 1.5–6.5)
NEUT%: 77.6 % (ref 39.6–80.0)
Platelets: 175 10*3/uL (ref 145–400)
RBC: 4.34 10*6/uL (ref 3.70–5.32)
RDW: 12.4 % (ref 11.1–15.7)
WBC: 6.8 10*3/uL (ref 3.9–10.0)

## 2014-04-03 LAB — IRON AND TIBC CHCC
%SAT: 17 % — ABNORMAL LOW (ref 21–57)
Iron: 66 ug/dL (ref 41–142)
TIBC: 380 ug/dL (ref 236–444)
UIBC: 314 ug/dL (ref 120–384)

## 2014-04-03 LAB — FERRITIN CHCC: Ferritin: 21 ng/ml (ref 9–269)

## 2014-04-03 NOTE — Progress Notes (Signed)
Hematology and Oncology Follow Up Visit  [name] [id number] 1945/08/05 69 y.o. 04/03/2014   Principle Diagnosis:  1. 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 2. Hemochromatosis (H63D heterozygote mutation). 3. Asthma. 4. Mitochondrial myopathy.  Current Therapy:   1. Phlebotomy to maintain hematocrit below 42%. 2. Aspirin 81 mg p.o. daily.     Interim History:  [name] is coming for followup.  She actually looks quite good. She did go to [facility] for evaluation of a myopathy. She has a mitochondrial myopathy. She had nerve conduction studies and EMGs done. I saw the report. She has a "abnormal study" with a "disfigurative myopathy". I'm not sure what this means. To me, I would think that she needs another muscle biopsy. She had one in 12 years ago. I think technology has improved since that if she has an underlying disorder and can be found.   She just feels very fatigued. She is not as short of breath. She saw her pulmonologist recently. He made an adjustment to her inhalers.  Overall, her appetite is doing okay. She's had no nausea vomiting.  She's had no change in bowel or bladder habits.  She does use a cane now. This is to help keep her stable.  Overall, her performance status is ECOG 1-2..  Medications:  Current outpatient prescriptions:  .  albuterol (PROAIR HFA) 108 (90 BASE) MCG/ACT inhaler, Inhale 2 puffs into the lungs every 4 (four) hours as needed for wheezing or shortness of breath., Disp: 1 Inhaler, Rfl: 3 .  aspirin 81 MG tablet, Take 81 mg by mouth daily., Disp: , Rfl:  .  Estradiol-Norethindrone Acet (LOPREEZA) 0.5-0.1 MG per tablet, Take 1 tablet by mouth daily., Disp: , Rfl:  .  hydrocortisone valerate cream (WESTCORT) 0.2 %, Apply 1 application topically 2 (two) times daily as needed (itching). , Disp: , Rfl:  .  LevOCARNitine L-Tartrate (L-CARNITINE) 500 MG CAPS, Take 2 capsules (1,000 mg total) by mouth daily., Disp: 60 capsule, Rfl: 4 .  LORazepam  (ATIVAN) 0.5 MG tablet, TAKE 1 TABLET BY MOUTH EVERY NIGHT AT BEDTIME, Disp: 30 tablet, Rfl: 0 .  Melatonin 3 MG TABS, Take 3 mg by mouth at bedtime. , Disp: , Rfl:  .  ondansetron (ZOFRAN ODT) 4 MG disintegrating tablet, 4mg  ODT q4 hours prn nausea/vomit, Disp: 8 tablet, Rfl: 0 .  QVAR 80 MCG/ACT inhaler, Inhale 2 puffs into the lungs 2 (two) times daily., Disp: 1 Inhaler, Rfl: 0 .  Tiotropium Bromide Monohydrate (SPIRIVA RESPIMAT) 2.5 MCG/ACT AERS, Inhale 2 puffs into the lungs daily., Disp: 1 Inhaler, Rfl: 6 .  Vitamin D, Ergocalciferol, (DRISDOL) 50000 UNITS CAPS capsule, Take 50,000 Units by mouth every Tuesday. , Disp: , Rfl:  .  Wheat Dextrin (CLEAR SOLUBLE FIBER) POWD, Take 5 mLs by mouth every morning. , Disp: , Rfl:   Allergies: No Known Allergies  Past Medical History, Surgical history, Social history, and Family History were reviewed and updated.  Review of Systems: As above  Physical Exam:  height is 5\' 4"  (1.626 m) and weight is 140 lb (63.504 kg). Her oral temperature is 98 F (36.7 C). Her blood pressure is 122/67 and her pulse is 86. Her respiration is 14.   Fairly well developed and well-nourished white female. Head and neck exam shows no ocular or oral lesions. There are no palpable cervical or supraclavicular lymph nodes. Lungs are clear. Cardiac exam regular rate and rhythm with no murmurs, rubs or bruits. Abdomen is soft. She has good bowel  sounds. There is no fluid wave. There is no palpable liver or spleen tip. Extremities shows no clubbing, cyanosis or edema. She has decent muscle tone. She has some muscle atrophy bilaterally. Neurological exam is non-focal. Skin exam no rashes.  Lab Results  Component Value Date   WBC 6.8 04/03/2014   HGB 14.0 04/03/2014   HCT 42.6 04/03/2014   MCV 98 04/03/2014   PLT 175 04/03/2014     Chemistry      Component Value Date/Time   NA 137 04/03/2014 1033   NA 134* 02/11/2014 2357   K 4.1 04/03/2014 1033   K 3.5 02/11/2014  2357   CL 99 04/03/2014 1033   CL 101 02/11/2014 2357   CO2 27 04/03/2014 1033   CO2 26 02/11/2014 2357   BUN 10 04/03/2014 1033   BUN 11 02/11/2014 2357   CREATININE 0.7 04/03/2014 1033   CREATININE 0.54 02/11/2014 2357      Component Value Date/Time   CALCIUM 8.9 04/03/2014 1033   CALCIUM 8.1* 02/11/2014 2357   ALKPHOS 61 04/03/2014 1033   ALKPHOS 59 02/11/2014 2357   AST 36 04/03/2014 1033   AST 31 02/11/2014 2357   ALT 28 04/03/2014 1033   ALT 28 02/11/2014 2357   BILITOT 1.00 04/03/2014 1033   BILITOT 0.8 02/11/2014 2357         Impression and Plan: [name] is 69 year old female who. She has polycythemia. She has hemachromatosis. She has mitochondrial myopathy.  We will hold off on phlebotomize her right now. I do want to keep her hematocrit below 42%. However, I think with anything going on, we do not need to make her too weak with a phlebotomy.  She said the last time, she had IV fluids after the phlebotomy and this made a world of difference. She felt so much better. We will have to do this with ejection phlebotomy.   We will plan to get her back in 6 weeks   [name], MD 3/8/201611:58 AM

## 2014-04-05 DIAGNOSIS — D510 Vitamin B12 deficiency anemia due to intrinsic factor deficiency: Secondary | ICD-10-CM | POA: Diagnosis not present

## 2014-04-19 ENCOUNTER — Telehealth: Payer: Self-pay | Admitting: Pulmonary Disease

## 2014-04-19 NOTE — Telephone Encounter (Signed)
LMTCB

## 2014-04-20 ENCOUNTER — Encounter: Payer: Self-pay | Admitting: *Deleted

## 2014-04-23 MED ORDER — TIOTROPIUM BROMIDE MONOHYDRATE 2.5 MCG/ACT IN AERS: 2.0000 | INHALATION_SPRAY | Freq: Every day | RESPIRATORY_TRACT | Status: DC

## 2014-04-23 NOTE — Telephone Encounter (Signed)
If she is doing better, continue on current regimen

## 2014-04-23 NOTE — Telephone Encounter (Signed)
Spoke with pt. States that Qvar and Spiriva Respimat are working well. Refill was needed on Spiriva and this has been sent in.  Will route message to [facility] so that he will be aware.

## 2014-05-01 ENCOUNTER — Other Ambulatory Visit: Payer: Self-pay | Admitting: Internal Medicine

## 2014-05-01 ENCOUNTER — Other Ambulatory Visit (HOSPITAL_BASED_OUTPATIENT_CLINIC_OR_DEPARTMENT_OTHER): Payer: Medicare Other

## 2014-05-01 ENCOUNTER — Ambulatory Visit (HOSPITAL_BASED_OUTPATIENT_CLINIC_OR_DEPARTMENT_OTHER): Payer: Medicare Other

## 2014-05-01 VITALS — BP 121/59 | HR 86 | Temp 97.5°F | Resp 18

## 2014-05-01 DIAGNOSIS — D45 Polycythemia vera: Secondary | ICD-10-CM

## 2014-05-01 DIAGNOSIS — D510 Vitamin B12 deficiency anemia due to intrinsic factor deficiency: Secondary | ICD-10-CM

## 2014-05-01 LAB — CBC WITH DIFFERENTIAL (CANCER CENTER ONLY)
BASO#: 0 10*3/uL (ref 0.0–0.2)
BASO%: 0.5 % (ref 0.0–2.0)
EOS%: 1.8 % (ref 0.0–7.0)
Eosinophils Absolute: 0.1 10*3/uL (ref 0.0–0.5)
HCT: 43.7 % (ref 34.8–46.6)
HGB: 14.6 g/dL (ref 11.6–15.9)
LYMPH#: 0.8 10*3/uL — ABNORMAL LOW (ref 0.9–3.3)
LYMPH%: 11.9 % — ABNORMAL LOW (ref 14.0–48.0)
MCH: 32.2 pg (ref 26.0–34.0)
MCHC: 33.4 g/dL (ref 32.0–36.0)
MCV: 96 fL (ref 81–101)
MONO#: 0.7 10*3/uL (ref 0.1–0.9)
MONO%: 10.5 % (ref 0.0–13.0)
NEUT#: 4.9 10*3/uL (ref 1.5–6.5)
NEUT%: 75.3 % (ref 39.6–80.0)
Platelets: 171 10*3/uL (ref 145–400)
RBC: 4.54 10*6/uL (ref 3.70–5.32)
RDW: 13.6 % (ref 11.1–15.7)
WBC: 6.6 10*3/uL (ref 3.9–10.0)

## 2014-05-01 LAB — IRON AND TIBC CHCC
%SAT: 32 % (ref 21–57)
Iron: 111 ug/dL (ref 41–142)
TIBC: 353 ug/dL (ref 236–444)
UIBC: 241 ug/dL (ref 120–384)

## 2014-05-01 LAB — FERRITIN CHCC: Ferritin: 31 ng/ml (ref 9–269)

## 2014-05-01 MED ORDER — SODIUM CHLORIDE 0.9 % IV SOLN
INTRAVENOUS | Status: AC
  Administered 2014-05-01: 12:00:00 via INTRAVENOUS

## 2014-05-01 NOTE — Progress Notes (Signed)
[name] presents today for phlebotomy per MD orders. Phlebotomy procedure started at 1200 and ended at 1212 500 grams removed. Patient observed for 30 minutes after procedure without any incident. Patient tolerated procedure well. IV needle removed intact.

## 2014-05-01 NOTE — Telephone Encounter (Signed)
Dr Laney Pastor, it looks like you haven't seen pt since 05/2013.

## 2014-05-01 NOTE — Patient Instructions (Signed)

## 2014-05-02 LAB — COMPREHENSIVE METABOLIC PANEL
ALT: 36 U/L — ABNORMAL HIGH (ref 0–35)
AST: 38 U/L — ABNORMAL HIGH (ref 0–37)
Albumin: 3.7 g/dL (ref 3.5–5.2)
Alkaline Phosphatase: 62 U/L (ref 39–117)
BUN: 12 mg/dL (ref 6–23)
CO2: 24 mEq/L (ref 19–32)
Calcium: 8.9 mg/dL (ref 8.4–10.5)
Chloride: 98 mEq/L (ref 96–112)
Creatinine, Ser: 0.56 mg/dL (ref 0.50–1.10)
Glucose, Bld: 81 mg/dL (ref 70–99)
Potassium: 4.2 mEq/L (ref 3.5–5.3)
Sodium: 133 mEq/L — ABNORMAL LOW (ref 135–145)
Total Bilirubin: 0.9 mg/dL (ref 0.2–1.2)
Total Protein: 6.9 g/dL (ref 6.0–8.3)

## 2014-05-02 LAB — RETICULOCYTES (CHCC)
ABS Retic: 54 10*3/uL (ref 19.0–186.0)
RBC.: 4.5 MIL/uL (ref 3.87–5.11)
Retic Ct Pct: 1.2 % (ref 0.4–2.3)

## 2014-05-02 LAB — VITAMIN B12: Vitamin B-12: 467 pg/mL (ref 211–911)

## 2014-05-02 LAB — VITAMIN D 25 HYDROXY (VIT D DEFICIENCY, FRACTURES): Vit D, 25-Hydroxy: 40 ng/mL (ref 30–100)

## 2014-05-02 NOTE — Telephone Encounter (Signed)
Called in.

## 2014-05-08 ENCOUNTER — Encounter: Payer: Self-pay | Admitting: Cardiovascular Disease

## 2014-05-08 ENCOUNTER — Ambulatory Visit (INDEPENDENT_AMBULATORY_CARE_PROVIDER_SITE_OTHER): Payer: Medicare Other | Admitting: Cardiovascular Disease

## 2014-05-08 VITALS — BP 104/62 | HR 84 | Ht 63.0 in | Wt 142.0 lb

## 2014-05-08 DIAGNOSIS — R079 Chest pain, unspecified: Secondary | ICD-10-CM

## 2014-05-08 NOTE — Patient Instructions (Signed)
Follow up with Dr Berry as needed.  

## 2014-05-08 NOTE — Progress Notes (Signed)
05/08/2014 [name]   Aug 19, 1945  [id number]  Primary Physician Velna Hatchet, MD Primary Cardiologist: Lorretta Harp MD Renae Gloss   HPI:  [name] is a 69 year old mildly overweight married Caucasian female mother of 2 children who is accompanied by her husband today. I last saw her in the office 12/09/10 and she was last seen by Kerin Ransom 03/22/12.she has a past medical history remarkable for a mitochondrial myopathy first diagnosed at [facility] many years ago. I performed cardiac catheterization on her 02/25/01 which was entirely normal. 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 She also recently has been diagnosed with polycythemia vera and hemochromatosis. She did have occasional PVCs when she was seen back in 2014 and was offered. Beta-blockade which she declined because of the side effects.   Current Outpatient Prescriptions  Medication Sig Dispense Refill  . albuterol (PROAIR HFA) 108 (90 BASE) MCG/ACT inhaler Inhale 2 puffs into the lungs every 4 (four) hours as needed for wheezing or shortness of breath. 1 Inhaler 3  . aspirin 81 MG tablet Take 81 mg by mouth daily.    . Estradiol-Norethindrone Acet (LOPREEZA) 0.5-0.1 MG per tablet Take 1 tablet by mouth daily.    . hydrocortisone valerate cream (WESTCORT) 0.2 % Apply 1 application topically 2 (two) times daily as needed (itching).     . LevOCARNitine L-Tartrate (L-CARNITINE) 500 MG CAPS Take 2 capsules (1,000 mg total) by mouth daily. 60 capsule 4  . LORazepam (ATIVAN) 0.5 MG tablet TAKE 1 TABLET BY MOUTH EVERY NIGHT AT BEDTIME 30 tablet 0  . Melatonin 3 MG TABS Take 3 mg by mouth at bedtime.     Marland Kitchen QVAR 80 MCG/ACT inhaler Inhale 2 puffs into the lungs 2 (two) times daily. 1 Inhaler 0  . Tiotropium Bromide Monohydrate (SPIRIVA RESPIMAT) 2.5 MCG/ACT AERS Inhale 2 puffs into the lungs daily. 1 Inhaler 5  . Vitamin D, Ergocalciferol, (DRISDOL) 50000 UNITS CAPS capsule Take 50,000 Units by mouth  every Tuesday.     . Wheat Dextrin (CLEAR SOLUBLE FIBER) POWD Take 5 mLs by mouth every morning.      No current facility-administered medications for this visit.    No Known Allergies  History   Social History  . Marital Status: Married    Spouse Name: Richard  . Number of Children: 2  . Years of Education: Grad   Occupational History  . Psychologist, educational    Social History Main Topics  . Smoking status: Never Smoker   . Smokeless tobacco: Never Used     Comment: never used tobacco  . Alcohol Use: 1.2 oz/week    2 Glasses of wine per week     Comment: occasionally  . Drug Use: No  . Sexual Activity: Not on file   Other Topics Concern  . Not on file   Social History Narrative   Health Care POA:    Emergency Contact: husband, Francene Finders, (c) [phone]   End of Life Plan:    Who lives with you: husband   Any pets: none   Diet: Pt has a varied diet.  Eats 5 sm. meals throughout day, focuses on protein, doesn't care for fruits and vegetables very much.   Exercise: Pt has a personal training and exercises several times a week.   Seatbelts: Pt reports wearing seatbelt when in vehicle.   Nancy Fetter Exposure/Protection: Pt reports wearing sun protection.    Hobbies: reading, visiting with friends   Patient has a  Masters degree.   Patient has two children.   Patient is retired.   Patient does not drink any caffeine.   Patient is right handed.              Review of Systems: General: negative for chills, fever, night sweats or weight changes.  Cardiovascular: negative for chest pain, dyspnea on exertion, edema, orthopnea, palpitations, paroxysmal nocturnal dyspnea or shortness of breath Dermatological: negative for rash Respiratory: negative for cough or wheezing Urologic: negative for hematuria Abdominal: negative for nausea, vomiting, diarrhea, bright red blood per rectum, melena, or hematemesis Neurologic: negative for visual changes, syncope, or dizziness All other  systems reviewed and are otherwise negative except as noted above.    Blood pressure 104/62, pulse 84, height 5\' 3"  (1.6 m), weight 142 lb (64.411 kg).  General appearance: alert and no distress Neck: no adenopathy, no carotid bruit, no JVD, supple, symmetrical, trachea midline and thyroid not enlarged, symmetric, no tenderness/mass/nodules Lungs: clear to auscultation bilaterally Heart: regular rate and rhythm, S1, S2 normal, no murmur, click, rub or gallop Extremities: extremities normal, atraumatic, no cyanosis or edema  EKG normal sinus rhythm at 84 with anterolateral T-wave inversion. I personally reviewed this EKG  ASSESSMENT AND PLAN:   Chest pain History of abnormal EKG with diffuse T-wave inversion tachycardic catheterization performed by myself 02/25/01 that was entirely normal.       Lorretta Harp MD [facility], [facility] 05/08/2014 4:22 PM

## 2014-05-08 NOTE — Assessment & Plan Note (Signed)
History of abnormal EKG with diffuse T-wave inversion tachycardic catheterization performed by myself 02/25/01 that was entirely normal.

## 2014-05-10 DIAGNOSIS — D510 Vitamin B12 deficiency anemia due to intrinsic factor deficiency: Secondary | ICD-10-CM | POA: Diagnosis not present

## 2014-05-15 ENCOUNTER — Ambulatory Visit: Payer: PRIVATE HEALTH INSURANCE | Admitting: Hematology & Oncology

## 2014-05-15 ENCOUNTER — Other Ambulatory Visit: Payer: PRIVATE HEALTH INSURANCE

## 2014-05-21 DIAGNOSIS — S40862A Insect bite (nonvenomous) of left upper arm, initial encounter: Secondary | ICD-10-CM | POA: Diagnosis not present

## 2014-05-22 ENCOUNTER — Other Ambulatory Visit: Payer: Self-pay | Admitting: Pulmonary Disease

## 2014-05-29 ENCOUNTER — Other Ambulatory Visit: Payer: PRIVATE HEALTH INSURANCE

## 2014-05-29 ENCOUNTER — Ambulatory Visit: Payer: PRIVATE HEALTH INSURANCE | Admitting: Hematology & Oncology

## 2014-06-06 DIAGNOSIS — L820 Inflamed seborrheic keratosis: Secondary | ICD-10-CM | POA: Diagnosis not present

## 2014-06-06 DIAGNOSIS — L821 Other seborrheic keratosis: Secondary | ICD-10-CM | POA: Diagnosis not present

## 2014-06-11 ENCOUNTER — Other Ambulatory Visit: Payer: Self-pay | Admitting: *Deleted

## 2014-06-11 DIAGNOSIS — D45 Polycythemia vera: Secondary | ICD-10-CM

## 2014-06-12 ENCOUNTER — Ambulatory Visit (HOSPITAL_BASED_OUTPATIENT_CLINIC_OR_DEPARTMENT_OTHER): Payer: Medicare Other

## 2014-06-12 ENCOUNTER — Other Ambulatory Visit (HOSPITAL_BASED_OUTPATIENT_CLINIC_OR_DEPARTMENT_OTHER): Payer: Medicare Other

## 2014-06-12 ENCOUNTER — Ambulatory Visit (HOSPITAL_BASED_OUTPATIENT_CLINIC_OR_DEPARTMENT_OTHER): Payer: Medicare Other | Admitting: Hematology & Oncology

## 2014-06-12 VITALS — BP 122/79 | HR 82

## 2014-06-12 VITALS — BP 125/67 | HR 82 | Temp 97.8°F | Resp 14 | Ht 63.0 in | Wt 141.0 lb

## 2014-06-12 DIAGNOSIS — D45 Polycythemia vera: Secondary | ICD-10-CM

## 2014-06-12 LAB — CBC WITH DIFFERENTIAL (CANCER CENTER ONLY)
BASO#: 0 10*3/uL (ref 0.0–0.2)
BASO%: 0.6 % (ref 0.0–2.0)
EOS%: 1.7 % (ref 0.0–7.0)
Eosinophils Absolute: 0.1 10*3/uL (ref 0.0–0.5)
HCT: 41.1 % (ref 34.8–46.6)
HGB: 13.9 g/dL (ref 11.6–15.9)
LYMPH#: 0.9 10*3/uL (ref 0.9–3.3)
LYMPH%: 12.4 % — ABNORMAL LOW (ref 14.0–48.0)
MCH: 32.3 pg (ref 26.0–34.0)
MCHC: 33.8 g/dL (ref 32.0–36.0)
MCV: 96 fL (ref 81–101)
MONO#: 0.8 10*3/uL (ref 0.1–0.9)
MONO%: 11.1 % (ref 0.0–13.0)
NEUT#: 5.2 10*3/uL (ref 1.5–6.5)
NEUT%: 74.2 % (ref 39.6–80.0)
Platelets: 169 10*3/uL (ref 145–400)
RBC: 4.3 10*6/uL (ref 3.70–5.32)
RDW: 14 % (ref 11.1–15.7)
WBC: 7 10*3/uL (ref 3.9–10.0)

## 2014-06-12 LAB — IRON AND TIBC CHCC
%SAT: 21 % (ref 21–57)
Iron: 73 ug/dL (ref 41–142)
TIBC: 344 ug/dL (ref 236–444)
UIBC: 271 ug/dL (ref 120–384)

## 2014-06-12 LAB — FERRITIN CHCC: Ferritin: 25 ng/ml (ref 9–269)

## 2014-06-12 MED ORDER — SODIUM CHLORIDE 0.9 % IV SOLN
Freq: Once | INTRAVENOUS | Status: AC
  Administered 2014-06-12: 12:00:00 via INTRAVENOUS

## 2014-06-12 NOTE — Patient Instructions (Signed)

## 2014-06-12 NOTE — Progress Notes (Signed)
Hematology and Oncology Follow Up Visit  [name] [id number] [birth year] 69 y.o. 06/12/2014   Principle Diagnosis:  1. 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 2. Hemochromatosis (H63D heterozygote mutation). 3. Asthma. 4. Mitochondrial myopathy.  Current Therapy:   1. Phlebotomy to maintain hematocrit below 42%. 2. Aspirin 81 mg p.o. daily.     Interim History:  Ms.  [name] is coming for followup.  She actually looks quite good. She does feel somewhat tired. Again I think this is a "bouncing back" with the hemochromatosis and polycythemia. We are flexible with how we phlebotomize her. There really is no set number that we use. We do use 42% but on occasion, we phlebotomize her if her hematocrit is below this.  She has this myopathy. She sees [facility] for this., Sure of any further workup is being planned for it.  She had quite a few actinic keratoses removed from her skin yesterday area and she says she has 17 removed.  She's had no change in bowel or bladder habits. His been no leg swelling. She's had no rashes. Overall, her appetite is doing okay. She's had no nausea vomiting.  Overall, her performance status is ECOG 1-2..  Medications:  Current outpatient prescriptions:  .  albuterol (PROAIR HFA) 108 (90 BASE) MCG/ACT inhaler, Inhale 2 puffs into the lungs every 4 (four) hours as needed for wheezing or shortness of breath., Disp: 1 Inhaler, Rfl: 3 .  aspirin 81 MG tablet, Take 81 mg by mouth daily., Disp: , Rfl:  .  Estradiol-Norethindrone Acet (LOPREEZA) 0.5-0.1 MG per tablet, Take 1 tablet by mouth daily., Disp: , Rfl:  .  hydrocortisone valerate cream (WESTCORT) 0.2 %, Apply 1 application topically 2 (two) times daily as needed (itching). , Disp: , Rfl:  .  LevOCARNitine L-Tartrate (L-CARNITINE) 500 MG CAPS, Take 2 capsules (1,000 mg total) by mouth daily., Disp: 60 capsule, Rfl: 4 .  LORazepam (ATIVAN) 0.5 MG tablet, TAKE 1 TABLET BY MOUTH EVERY NIGHT AT BEDTIME, Disp: 30  tablet, Rfl: 0 .  Melatonin 3 MG TABS, Take 3 mg by mouth at bedtime. , Disp: , Rfl:  .  QVAR 80 MCG/ACT inhaler, Inhale 2 puffs into the lungs 2 (two) times daily., Disp: 1 Inhaler, Rfl: 0 .  Tiotropium Bromide Monohydrate (SPIRIVA RESPIMAT) 2.5 MCG/ACT AERS, Inhale 2 puffs into the lungs daily., Disp: 1 Inhaler, Rfl: 5 .  Vitamin D, Ergocalciferol, (DRISDOL) 50000 UNITS CAPS capsule, Take 50,000 Units by mouth every Tuesday. , Disp: , Rfl:  .  Wheat Dextrin (CLEAR SOLUBLE FIBER) POWD, Take 5 mLs by mouth every morning. , Disp: , Rfl:   Allergies: No Known Allergies  Past Medical History, Surgical history, Social history, and Family History were reviewed and updated.  Review of Systems: As above  Physical Exam:  height is 5\' 3"  (1.6 m) and weight is 141 lb (63.957 kg). Her oral temperature is 97.8 F (36.6 C). Her blood pressure is 125/67 and her pulse is 82. Her respiration is 14.   Fairly well developed and well-nourished white female. Head and neck exam shows no ocular or oral lesions. There are no palpable cervical or supraclavicular lymph nodes. Lungs are clear. Cardiac exam regular rate and rhythm with no murmurs, rubs or bruits. Abdomen is soft. She has good bowel sounds. There is no fluid wave. There is no palpable liver or spleen tip. Extremities shows no clubbing, cyanosis or edema. She has decent muscle tone. She has some muscle atrophy bilaterally. Neurological exam is non-focal. Skin  exam no rashes.  Lab Results  Component Value Date   WBC 7.0 06/12/2014   HGB 13.9 06/12/2014   HCT 41.1 06/12/2014   MCV 96 06/12/2014   PLT 169 06/12/2014     Chemistry      Component Value Date/Time   NA 133* 05/01/2014 1116   NA 137 04/03/2014 1033   K 4.2 05/01/2014 1116   K 4.1 04/03/2014 1033   CL 98 05/01/2014 1116   CL 99 04/03/2014 1033   CO2 24 05/01/2014 1116   CO2 27 04/03/2014 1033   BUN 12 05/01/2014 1116   BUN 10 04/03/2014 1033   CREATININE 0.56 05/01/2014 1116    CREATININE 0.7 04/03/2014 1033      Component Value Date/Time   CALCIUM 8.9 05/01/2014 1116   CALCIUM 8.9 04/03/2014 1033   ALKPHOS 62 05/01/2014 1116   ALKPHOS 61 04/03/2014 1033   AST 38* 05/01/2014 1116   AST 36 04/03/2014 1033   ALT 36* 05/01/2014 1116   ALT 28 04/03/2014 1033   BILITOT 0.9 05/01/2014 1116   BILITOT 1.00 04/03/2014 1033         Impression and Plan: [name] is 69 year old female who. She has polycythemia. She has hemachromatosis. She has mitochondrial myopathy.  We will go ahead and phlebotomize her today. She feels that she needs a phlebotomy.  I told her that I did not think that her hemachromatosis is causing any of her joint issues. Her ferritin has been very well controlled. I just don't see that it is high note that she did have iron deposition in her joints.  I don't know if lack of iron might be affecting the myopathy. I suppose this survey could be a possibility.  I think she goes back out to [facility] in another few weeks.   We will plan to get her back in 6 weeks   [name], MD 5/17/201612:24 PM

## 2014-06-14 DIAGNOSIS — Z8679 Personal history of other diseases of the circulatory system: Secondary | ICD-10-CM | POA: Diagnosis not present

## 2014-06-14 DIAGNOSIS — H2513 Age-related nuclear cataract, bilateral: Secondary | ICD-10-CM | POA: Diagnosis not present

## 2014-06-14 DIAGNOSIS — H43813 Vitreous degeneration, bilateral: Secondary | ICD-10-CM | POA: Diagnosis not present

## 2014-06-14 DIAGNOSIS — D3132 Benign neoplasm of left choroid: Secondary | ICD-10-CM | POA: Diagnosis not present

## 2014-06-18 ENCOUNTER — Telehealth: Payer: Self-pay | Admitting: Pulmonary Disease

## 2014-06-18 MED ORDER — UMECLIDINIUM BROMIDE 62.5 MCG/INH IN AEPB: 1.0000 | INHALATION_SPRAY | Freq: Every day | RESPIRATORY_TRACT | Status: DC

## 2014-06-18 NOTE — Telephone Encounter (Signed)
We can try her on tudorza or incruse.  Can provide samples for her to see if this is better.

## 2014-06-18 NOTE — Telephone Encounter (Signed)
We have incruse samples but no tudorza. Spoke with pt, she is aware that samples are left up front for her.  Will let us know how she does with the Incruse.  Nothing further needed.

## 2014-06-18 NOTE — Telephone Encounter (Signed)
Called and spoke to pt. Pt stated 5-6 days ago (06/12/14) pt experienced urinary retention so she decreased her spiriva respimat from 2 puffs qd to 1 puff qd and since then her urinary retention has resolved but now her SOB has worsened with activity. Pt requesting recs from [facility]. Pt denies cough, CP/tightness, f/c/s.   Anon Raices please advise.

## 2014-06-19 ENCOUNTER — Other Ambulatory Visit: Payer: Self-pay | Admitting: Pulmonary Disease

## 2014-06-22 LAB — VITAMIN B12 DEFICIENCY PANEL - CHCC

## 2014-06-22 LAB — COMPREHENSIVE METABOLIC PANEL
ALT: 36 U/L — ABNORMAL HIGH (ref 0–35)
AST: 38 U/L — ABNORMAL HIGH (ref 0–37)
Albumin: 3.5 g/dL (ref 3.5–5.2)
Alkaline Phosphatase: 60 U/L (ref 39–117)
BUN: 10 mg/dL (ref 6–23)
CO2: 24 mEq/L (ref 19–32)
Calcium: 9 mg/dL (ref 8.4–10.5)
Chloride: 102 mEq/L (ref 96–112)
Creatinine, Ser: 0.59 mg/dL (ref 0.50–1.10)
Glucose, Bld: 104 mg/dL — ABNORMAL HIGH (ref 70–99)
Potassium: 5 mEq/L (ref 3.5–5.3)
Sodium: 136 mEq/L (ref 135–145)
Total Bilirubin: 0.8 mg/dL (ref 0.2–1.2)
Total Protein: 6.4 g/dL (ref 6.0–8.3)

## 2014-06-22 LAB — RETICULOCYTES (CHCC)
ABS Retic: 65.9 10*3/uL (ref 19.0–186.0)
RBC.: 4.39 MIL/uL (ref 3.87–5.11)
Retic Ct Pct: 1.5 % (ref 0.4–2.3)

## 2014-06-22 LAB — VITAMIN D 25 HYDROXY (VIT D DEFICIENCY, FRACTURES): Vit D, 25-Hydroxy: 39 ng/mL (ref 30–100)

## 2014-07-12 DIAGNOSIS — D510 Vitamin B12 deficiency anemia due to intrinsic factor deficiency: Secondary | ICD-10-CM | POA: Diagnosis not present

## 2014-07-23 DIAGNOSIS — G729 Myopathy, unspecified: Secondary | ICD-10-CM | POA: Diagnosis not present

## 2014-07-23 DIAGNOSIS — G250 Essential tremor: Secondary | ICD-10-CM | POA: Diagnosis not present

## 2014-07-31 ENCOUNTER — Ambulatory Visit (HOSPITAL_BASED_OUTPATIENT_CLINIC_OR_DEPARTMENT_OTHER): Payer: Medicare Other | Admitting: Hematology & Oncology

## 2014-07-31 ENCOUNTER — Ambulatory Visit: Payer: PRIVATE HEALTH INSURANCE

## 2014-07-31 ENCOUNTER — Encounter: Payer: Self-pay | Admitting: Hematology & Oncology

## 2014-07-31 ENCOUNTER — Other Ambulatory Visit (HOSPITAL_BASED_OUTPATIENT_CLINIC_OR_DEPARTMENT_OTHER): Payer: Medicare Other

## 2014-07-31 VITALS — BP 122/71 | HR 81 | Temp 97.2°F | Resp 16 | Ht 63.0 in | Wt 142.0 lb

## 2014-07-31 DIAGNOSIS — G252 Other specified forms of tremor: Principal | ICD-10-CM

## 2014-07-31 DIAGNOSIS — D45 Polycythemia vera: Secondary | ICD-10-CM

## 2014-07-31 DIAGNOSIS — R251 Tremor, unspecified: Secondary | ICD-10-CM

## 2014-07-31 DIAGNOSIS — F419 Anxiety disorder, unspecified: Secondary | ICD-10-CM | POA: Diagnosis not present

## 2014-07-31 DIAGNOSIS — G250 Essential tremor: Secondary | ICD-10-CM

## 2014-07-31 LAB — COMPREHENSIVE METABOLIC PANEL
ALT: 37 U/L — ABNORMAL HIGH (ref 0–35)
AST: 34 U/L (ref 0–37)
Albumin: 3.5 g/dL (ref 3.5–5.2)
Alkaline Phosphatase: 58 U/L (ref 39–117)
BUN: 12 mg/dL (ref 6–23)
CO2: 23 mEq/L (ref 19–32)
Calcium: 8.7 mg/dL (ref 8.4–10.5)
Chloride: 105 mEq/L (ref 96–112)
Creatinine, Ser: 0.62 mg/dL (ref 0.50–1.10)
Glucose, Bld: 89 mg/dL (ref 70–99)
Potassium: 4 mEq/L (ref 3.5–5.3)
Sodium: 136 mEq/L (ref 135–145)
Total Bilirubin: 0.8 mg/dL (ref 0.2–1.2)
Total Protein: 6.5 g/dL (ref 6.0–8.3)

## 2014-07-31 LAB — CBC WITH DIFFERENTIAL (CANCER CENTER ONLY)
BASO#: 0.1 10*3/uL (ref 0.0–0.2)
BASO%: 0.6 % (ref 0.0–2.0)
EOS%: 1.1 % (ref 0.0–7.0)
Eosinophils Absolute: 0.1 10*3/uL (ref 0.0–0.5)
HCT: 39.9 % (ref 34.8–46.6)
HGB: 13.2 g/dL (ref 11.6–15.9)
LYMPH#: 1 10*3/uL (ref 0.9–3.3)
LYMPH%: 12.5 % — ABNORMAL LOW (ref 14.0–48.0)
MCH: 31.4 pg (ref 26.0–34.0)
MCHC: 33.1 g/dL (ref 32.0–36.0)
MCV: 95 fL (ref 81–101)
MONO#: 0.8 10*3/uL (ref 0.1–0.9)
MONO%: 9.5 % (ref 0.0–13.0)
NEUT#: 6.3 10*3/uL (ref 1.5–6.5)
NEUT%: 76.3 % (ref 39.6–80.0)
Platelets: 160 10*3/uL (ref 145–400)
RBC: 4.2 10*6/uL (ref 3.70–5.32)
RDW: 13.5 % (ref 11.1–15.7)
WBC: 8.3 10*3/uL (ref 3.9–10.0)

## 2014-07-31 LAB — IRON AND TIBC CHCC
%SAT: 22 % (ref 21–57)
Iron: 75 ug/dL (ref 41–142)
TIBC: 333 ug/dL (ref 236–444)
UIBC: 258 ug/dL (ref 120–384)

## 2014-07-31 LAB — FERRITIN CHCC: Ferritin: 18 ng/ml (ref 9–269)

## 2014-07-31 MED ORDER — VITAMIN B-6 250 MG PO TABS
250.0000 mg | ORAL_TABLET | Freq: Every day | ORAL | Status: DC
Start: 2014-07-31 — End: 2014-08-21

## 2014-07-31 NOTE — Progress Notes (Signed)
Hematology and Oncology Follow Up Visit  [name] [id number] 07/12/1945 69 y.o. 07/31/2014   Principle Diagnosis:  1. 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 2. Hemochromatosis (H63D heterozygote mutation). 3. Asthma. 4. Mitochondrial myopathy.  Current Therapy:   1. Phlebotomy to maintain hematocrit below 42%. 2. Aspirin 81 mg p.o. daily.     Interim History:  Ms.  [name] is coming for followup.  She actually looks quite good. She does feel somewhat tired.   She has been seen at [facility]. She was found have an essential tremor. She was seen by neurology.  She otherwise is doing okay although she cannot July things because of fatigue. I think she gets short of breath pretty easily.  She's had no rashes. She's had no nausea or vomiting. She's had no cough.  Overall, her performance status is ECOG 1-2..  Medications:  Current outpatient prescriptions:  .  albuterol (PROAIR HFA) 108 (90 BASE) MCG/ACT inhaler, Inhale 2 puffs into the lungs every 4 (four) hours as needed for wheezing or shortness of breath., Disp: 1 Inhaler, Rfl: 3 .  aspirin 81 MG tablet, Take 81 mg by mouth daily., Disp: , Rfl:  .  Cholecalciferol 50000 UNITS TABS, Take by mouth once a week. , Disp: , Rfl:  .  Estradiol-Norethindrone Acet (LOPREEZA) 0.5-0.1 MG per tablet, Take 1 tablet by mouth daily., Disp: , Rfl:  .  hydrocortisone valerate cream (WESTCORT) 0.2 %, Apply 1 application topically 2 (two) times daily as needed (itching). , Disp: , Rfl:  .  LevOCARNitine L-Tartrate (L-CARNITINE) 500 MG CAPS, Take 2 capsules (1,000 mg total) by mouth daily., Disp: 60 capsule, Rfl: 4 .  LORazepam (ATIVAN) 0.5 MG tablet, TAKE 1 TABLET BY MOUTH EVERY NIGHT AT BEDTIME, Disp: 30 tablet, Rfl: 0 .  Melatonin 3 MG TABS, Take 3 mg by mouth at bedtime. , Disp: , Rfl:  .  QVAR 80 MCG/ACT inhaler, INHALE 2 PUFFS INTO THE LUNGS TWICE DAILY., Disp: 8.7 g, Rfl: 3 .  SPIRIVA RESPIMAT 2.5 MCG/ACT AERS, INL 2 PFS PO D, Disp: , Rfl: 5 .   Vitamin D, Ergocalciferol, (DRISDOL) 50000 UNITS CAPS capsule, Take 50,000 Units by mouth every Tuesday. , Disp: , Rfl:  .  Wheat Dextrin (CLEAR SOLUBLE FIBER) POWD, Take 5 mLs by mouth every morning. , Disp: , Rfl:  .  Pyridoxine HCl (VITAMIN B-6) 250 MG tablet, Take 1 tablet (250 mg total) by mouth daily., Disp: 30 tablet, Rfl: 5  Allergies: No Known Allergies  Past Medical History, Surgical history, Social history, and Family History were reviewed and updated.  Review of Systems: As above  Physical Exam:  height is 5\' 3"  (1.6 m) and weight is 142 lb (64.411 kg). Her oral temperature is 97.2 F (36.2 C). Her blood pressure is 122/71 and her pulse is 81. Her respiration is 16.   Fairly well developed and well-nourished white female. Head and neck exam shows no ocular or oral lesions. There are no palpable cervical or supraclavicular lymph nodes. Lungs are clear. Cardiac exam regular rate and rhythm with no murmurs, rubs or bruits. Abdomen is soft. She has good bowel sounds. There is no fluid wave. There is no palpable liver or spleen tip. Extremities shows no clubbing, cyanosis or edema. She has decent muscle tone. She has some muscle atrophy bilaterally. Neurological exam is non-focal. Skin exam no rashes.  Lab Results  Component Value Date   WBC 8.3 07/31/2014   HGB 13.2 07/31/2014   HCT 39.9 07/31/2014  MCV 95 07/31/2014   PLT 160 07/31/2014     Chemistry      Component Value Date/Time   NA 136 06/12/2014 1037   NA 137 04/03/2014 1033   K 5.0 06/12/2014 1037   K 4.1 04/03/2014 1033   CL 102 06/12/2014 1037   CL 99 04/03/2014 1033   CO2 24 06/12/2014 1037   CO2 27 04/03/2014 1033   BUN 10 06/12/2014 1037   BUN 10 04/03/2014 1033   CREATININE 0.59 06/12/2014 1037   CREATININE 0.7 04/03/2014 1033      Component Value Date/Time   CALCIUM 9.0 06/12/2014 1037   CALCIUM 8.9 04/03/2014 1033   ALKPHOS 60 06/12/2014 1037   ALKPHOS 61 04/03/2014 1033   AST 38* 06/12/2014 1037     AST 36 04/03/2014 1033   ALT 36* 06/12/2014 1037   ALT 28 04/03/2014 1033   BILITOT 0.8 06/12/2014 1037   BILITOT 1.00 04/03/2014 1033         Impression and Plan: [name] is 69 year old female who. She has polycythemia. She has hemachromatosis. She has mitochondrial myopathy.  She now has an essential tremor. Is being seen at [facility]. She goes back out there in a few days for another evaluation. I think this might be for the myopathy.  I will try her on some vitamin B6. We will try to 50 mg of vitaminB6  I think that some of her tiredness might be from low iron. She does not need to be phlebotomized right now.  She does have the underlying COPD. She is being followed by pulmonology for this. She is a little bit upset that her regular pulmonologist has left. I reassured her that the one that she is seen, Dr. Lamonte Sakai is incredibly good and is very patient and oriented. I know that she will like him.  I will get her back in 6 weeks. [name], MD 7/5/20162:58 PM

## 2014-07-31 NOTE — Progress Notes (Signed)
Pt seen by Dr. Marin Olp, Hct 39.9, no phlebotomy ordered.

## 2014-08-03 DIAGNOSIS — G729 Myopathy, unspecified: Secondary | ICD-10-CM | POA: Diagnosis not present

## 2014-08-03 DIAGNOSIS — R262 Difficulty in walking, not elsewhere classified: Secondary | ICD-10-CM | POA: Diagnosis not present

## 2014-08-07 DIAGNOSIS — N950 Postmenopausal bleeding: Secondary | ICD-10-CM | POA: Diagnosis not present

## 2014-08-07 DIAGNOSIS — D251 Intramural leiomyoma of uterus: Secondary | ICD-10-CM | POA: Diagnosis not present

## 2014-08-13 DIAGNOSIS — N950 Postmenopausal bleeding: Secondary | ICD-10-CM | POA: Diagnosis not present

## 2014-08-13 DIAGNOSIS — N840 Polyp of corpus uteri: Secondary | ICD-10-CM | POA: Diagnosis not present

## 2014-08-21 ENCOUNTER — Ambulatory Visit (INDEPENDENT_AMBULATORY_CARE_PROVIDER_SITE_OTHER): Payer: Medicare Other | Admitting: Emergency Medicine

## 2014-08-21 ENCOUNTER — Encounter: Payer: Self-pay | Admitting: Emergency Medicine

## 2014-08-21 VITALS — BP 130/70 | HR 96

## 2014-08-21 DIAGNOSIS — J449 Chronic obstructive pulmonary disease, unspecified: Secondary | ICD-10-CM

## 2014-08-21 NOTE — Progress Notes (Signed)
Subjective:    Patient ID: [name], female    DOB: [birth year], 68 y.o.   MRN: [id number]  HPI OV 03/20/14 -- Patient comes in today for follow-up of her known chronic obstructive asthma. He has been maintained on Qvar, and at the last visit we tried Spiriva Respimat. Because of expense, she has been using half dose, and most recently has been having issues with increasing shortness of breath. She has been using her rescue inhaler excessively, but is unsure if this helps. She has not wanted to use LABA's because of side effects. He should also be kept in mind that she has some type of neuromuscular disease which appears to be progressing, wraps this is the cause of her dyspnea. She is scheduled to be evaluated at [facility] in the near future.  ROV 08/21/14 -- follow-up visit for patient who has been managed by Dr. Gwenette Greet for severe chronic obstructive disease with an asthmatic component based on a positive bronchodilator response on temporary function testing from 01/23/13 that I have personally reviewed. On that study she also had restricted lung volumes and a decreased diffusion capacity. 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 She was seen at [facility] re: the myopathy, was defined as mild-to-mod on EMG. Is planning to do some PT.  She is using Spiriva respimat 1 puff qd + QVAR bid. Rarely uses albuterol. She describes significant exertional dyspnea, especially with staircase, lifting.  She denies any cough, no mucous production. She underwent a walking oximetry w Dr [name], did not desaturate.    Review of Systems  Constitutional: Negative for fever and unexpected weight change.  HENT: Positive for congestion, postnasal drip and sinus pressure. Negative for dental problem, ear pain, nosebleeds, rhinorrhea, sneezing, sore throat and trouble swallowing.   Eyes: Negative for redness and itching.  Respiratory: Positive for cough, chest tightness and  shortness of breath. Negative for wheezing.   Cardiovascular: Negative for palpitations and leg swelling.  Gastrointestinal: Negative for nausea and vomiting.  Genitourinary: Negative for dysuria.  Musculoskeletal: Negative for joint swelling.  Skin: Negative for rash.  Neurological: Negative for headaches.  Hematological: Does not bruise/bleed easily.  Psychiatric/Behavioral: Negative for dysphoric mood. The patient is not nervous/anxious.    Past Medical History  Diagnosis Date  . Pericarditis     age 64  . Pneumothorax   . Anxiety   . Iliotibial band syndrome     left knee  . Vitamin D deficiency   . Migraine   . Asthma   . Anemia   . IBS (irritable bowel syndrome)   . Hemorrhoids   . Cholelithiasis   . Mitochondrial myopathy   . Heart attack   . Osteoporosis   . Allergy   . Blood transfusion without reported diagnosis   . 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 PVC's (premature ventricular contractions)   . Normal coronary arteries     by cardiac catheterization performed by myself 02/25/01     Family History  Problem Relation Age of Onset  . Asthma Mother   . Arthritis Mother   . Depression Mother   . Cancer Father   . Emphysema Father   . Stroke Father   . Alcohol abuse Father   . Heart attack Father   . Colon cancer Neg Hx      History   Social History  . Marital Status: Married    Spouse Name: Richard  . Number of Children: 2  . Years of  Education: Grad   Occupational History  . Psychologist, educational    Social History Main Topics  . Smoking status: Never Smoker   . Smokeless tobacco: Never Used     Comment: never used tobacco  . Alcohol Use: 1.2 oz/week    2 Glasses of wine per week     Comment: occasionally  . Drug Use: No  . Sexual Activity: Not on file   Other Topics Concern  . Not on file   Social History Narrative   Health Care POA:    Emergency Contact: husband, Francene Finders, (c) [phone]   End of Life Plan:    Who lives with you:  husband   Any pets: none   Diet: Pt has a varied diet.  Eats 5 sm. meals throughout day, focuses on protein, doesn't care for fruits and vegetables very much.   Exercise: Pt has a personal training and exercises several times a week.   Seatbelts: Pt reports wearing seatbelt when in vehicle.   Nancy Fetter Exposure/Protection: Pt reports wearing sun protection.    Hobbies: reading, visiting with friends   Patient has a Scientist, water quality.   Patient has two children.   Patient is retired.   Patient does not drink any caffeine.   Patient is right handed.              No Known Allergies   Outpatient Prescriptions Prior to Visit  Medication Sig Dispense Refill  . albuterol (PROAIR HFA) 108 (90 BASE) MCG/ACT inhaler Inhale 2 puffs into the lungs every 4 (four) hours as needed for wheezing or shortness of breath. 1 Inhaler 3  . aspirin 81 MG tablet Take 81 mg by mouth daily.    . Estradiol-Norethindrone Acet (LOPREEZA) 0.5-0.1 MG per tablet Take 1 tablet by mouth daily.    . hydrocortisone valerate cream (WESTCORT) 0.2 % Apply 1 application topically 2 (two) times daily as needed (itching).     . LevOCARNitine L-Tartrate (L-CARNITINE) 500 MG CAPS Take 2 capsules (1,000 mg total) by mouth daily. 60 capsule 4  . LORazepam (ATIVAN) 0.5 MG tablet TAKE 1 TABLET BY MOUTH EVERY NIGHT AT BEDTIME 30 tablet 0  . Melatonin 3 MG TABS Take 3 mg by mouth at bedtime.     Marland Kitchen QVAR 80 MCG/ACT inhaler INHALE 2 PUFFS INTO THE LUNGS TWICE DAILY. 8.7 g 3  . SPIRIVA RESPIMAT 2.5 MCG/ACT AERS 1 puff once day  5  . Vitamin D, Ergocalciferol, (DRISDOL) 50000 UNITS CAPS capsule Take 50,000 Units by mouth every Tuesday.     . Wheat Dextrin (CLEAR SOLUBLE FIBER) POWD Take 5 mLs by mouth every morning.     . Cholecalciferol 50000 UNITS TABS Take by mouth once a week.     . Pyridoxine HCl (VITAMIN B-6) 250 MG tablet Take 1 tablet (250 mg total) by mouth daily. 30 tablet 5   No facility-administered medications prior to visit.          Objective:   Physical Exam Filed Vitals:   08/21/14 1333  BP: 130/70  Pulse: 96  SpO2: 97%   Gen: Pleasant, well-nourished, in no distress,  normal affect  ENT: No lesions,  mouth clear,  oropharynx clear, no postnasal drip  Neck: No JVD, no TMG, no carotid bruits  Lungs: No use of accessory muscles, clear without rales or rhonchi  Cardiovascular: RRR, heart sounds normal, no murmur or gallops, no peripheral edema  Musculoskeletal: No deformities, no cyanosis or clubbing  Neuro: alert, non focal  Skin: Warm, no lesions or rashes       Assessment & Plan:  Chronic obstructive airway disease with asthma Fixed asthma with possibly some superimposed restrictive lung disease due to her  Myopathy. Based on her previous side effects I would not change her bronchodilators at this time. I would keep her on an inhaled steroid. I believe she'll benefit from cardiopulmonary rehabilitation at some point in the future. I offered to repeat her platelet function testing but are not sure that it would change our management at this time we decided to defer. She has a recent walking oximetry that was reassuring. I will see her in 6 months or when necessary

## 2014-08-21 NOTE — Patient Instructions (Addendum)
Please continue your inhaled medications as you have been taking them  We could consider pulmonary function testing in the future.  Follow with Dr Lamonte Sakai in 6 months or sooner if you have any problems

## 2014-08-21 NOTE — Assessment & Plan Note (Signed)
Fixed asthma with possibly some superimposed restrictive lung disease due to her  Myopathy. Based on her previous side effects I would not change her bronchodilators at this time. I would keep her on an inhaled steroid. I believe she'll benefit from cardiopulmonary rehabilitation at some point in the future. I offered to repeat her platelet function testing but are not sure that it would change our management at this time we decided to defer. She has a recent walking oximetry that was reassuring. I will see her in 6 months or when necessary

## 2014-08-23 DIAGNOSIS — M6281 Muscle weakness (generalized): Secondary | ICD-10-CM | POA: Diagnosis not present

## 2014-08-23 DIAGNOSIS — G729 Myopathy, unspecified: Secondary | ICD-10-CM | POA: Diagnosis not present

## 2014-08-29 DIAGNOSIS — M6281 Muscle weakness (generalized): Secondary | ICD-10-CM | POA: Diagnosis not present

## 2014-08-29 DIAGNOSIS — G729 Myopathy, unspecified: Secondary | ICD-10-CM | POA: Diagnosis not present

## 2014-08-30 DIAGNOSIS — D251 Intramural leiomyoma of uterus: Secondary | ICD-10-CM | POA: Diagnosis not present

## 2014-08-30 DIAGNOSIS — N840 Polyp of corpus uteri: Secondary | ICD-10-CM | POA: Diagnosis not present

## 2014-08-30 DIAGNOSIS — N950 Postmenopausal bleeding: Secondary | ICD-10-CM | POA: Diagnosis not present

## 2014-09-05 DIAGNOSIS — M6281 Muscle weakness (generalized): Secondary | ICD-10-CM | POA: Diagnosis not present

## 2014-09-05 DIAGNOSIS — G729 Myopathy, unspecified: Secondary | ICD-10-CM | POA: Diagnosis not present

## 2014-09-10 ENCOUNTER — Other Ambulatory Visit: Payer: Self-pay

## 2014-09-10 DIAGNOSIS — D510 Vitamin B12 deficiency anemia due to intrinsic factor deficiency: Secondary | ICD-10-CM

## 2014-09-11 ENCOUNTER — Encounter: Payer: Self-pay | Admitting: Hematology & Oncology

## 2014-09-11 ENCOUNTER — Ambulatory Visit (HOSPITAL_BASED_OUTPATIENT_CLINIC_OR_DEPARTMENT_OTHER): Payer: Medicare Other | Admitting: Hematology & Oncology

## 2014-09-11 ENCOUNTER — Ambulatory Visit (HOSPITAL_BASED_OUTPATIENT_CLINIC_OR_DEPARTMENT_OTHER): Payer: Medicare Other

## 2014-09-11 ENCOUNTER — Other Ambulatory Visit (HOSPITAL_BASED_OUTPATIENT_CLINIC_OR_DEPARTMENT_OTHER): Payer: Medicare Other

## 2014-09-11 VITALS — BP 123/66 | HR 92 | Temp 97.7°F | Resp 16 | Ht 63.0 in | Wt 141.0 lb

## 2014-09-11 DIAGNOSIS — D45 Polycythemia vera: Secondary | ICD-10-CM | POA: Diagnosis not present

## 2014-09-11 DIAGNOSIS — F411 Generalized anxiety disorder: Secondary | ICD-10-CM

## 2014-09-11 DIAGNOSIS — G713 Mitochondrial myopathy, not elsewhere classified: Secondary | ICD-10-CM | POA: Diagnosis not present

## 2014-09-11 DIAGNOSIS — D510 Vitamin B12 deficiency anemia due to intrinsic factor deficiency: Secondary | ICD-10-CM

## 2014-09-11 LAB — CBC WITH DIFFERENTIAL (CANCER CENTER ONLY)
BASO#: 0 10*3/uL (ref 0.0–0.2)
BASO%: 0.4 % (ref 0.0–2.0)
EOS%: 1.9 % (ref 0.0–7.0)
Eosinophils Absolute: 0.2 10*3/uL (ref 0.0–0.5)
HCT: 42.8 % (ref 34.8–46.6)
HGB: 14.6 g/dL (ref 11.6–15.9)
LYMPH#: 1.2 10*3/uL (ref 0.9–3.3)
LYMPH%: 13 % — ABNORMAL LOW (ref 14.0–48.0)
MCH: 32.2 pg (ref 26.0–34.0)
MCHC: 34.1 g/dL (ref 32.0–36.0)
MCV: 94 fL (ref 81–101)
MONO#: 0.8 10*3/uL (ref 0.1–0.9)
MONO%: 9.1 % (ref 0.0–13.0)
NEUT#: 6.8 10*3/uL — ABNORMAL HIGH (ref 1.5–6.5)
NEUT%: 75.6 % (ref 39.6–80.0)
Platelets: 178 10*3/uL (ref 145–400)
RBC: 4.54 10*6/uL (ref 3.70–5.32)
RDW: 14.9 % (ref 11.1–15.7)
WBC: 8.9 10*3/uL (ref 3.9–10.0)

## 2014-09-11 LAB — IRON AND TIBC
%SAT: 22 % (ref 20–55)
Iron: 68 ug/dL (ref 42–145)
TIBC: 309 ug/dL (ref 250–470)
UIBC: 241 ug/dL (ref 125–400)

## 2014-09-11 LAB — COMPREHENSIVE METABOLIC PANEL
ALT: 30 U/L — ABNORMAL HIGH (ref 6–29)
AST: 30 U/L (ref 10–35)
Albumin: 3.3 g/dL — ABNORMAL LOW (ref 3.6–5.1)
Alkaline Phosphatase: 62 U/L (ref 33–130)
BUN: 11 mg/dL (ref 7–25)
CO2: 22 mmol/L (ref 20–31)
Calcium: 8.9 mg/dL (ref 8.6–10.4)
Chloride: 103 mmol/L (ref 98–110)
Creatinine, Ser: 0.57 mg/dL — ABNORMAL LOW (ref 0.60–0.93)
Glucose, Bld: 101 mg/dL — ABNORMAL HIGH (ref 65–99)
Potassium: 4 mmol/L (ref 3.5–5.3)
Sodium: 135 mmol/L (ref 135–146)
Total Bilirubin: 0.6 mg/dL (ref 0.2–1.2)
Total Protein: 6.2 g/dL (ref 6.1–8.1)

## 2014-09-11 LAB — FERRITIN: Ferritin: 20 ng/mL (ref 10–291)

## 2014-09-11 MED ORDER — SODIUM CHLORIDE 0.9 % IV SOLN: Freq: Once | INTRAVENOUS | Status: DC

## 2014-09-11 NOTE — Progress Notes (Signed)
[name] presents today for phlebotomy per MD orders. Phlebotomy procedure started at 1300 and ended at 1315. 500 ml removed. Patient observed for 30 minutes after procedure without any incident. Patient tolerated procedure well. IV needle removed intact.

## 2014-09-11 NOTE — Patient Instructions (Signed)

## 2014-09-11 NOTE — Progress Notes (Signed)
Hematology and Oncology Follow Up Visit  [name] [id number] 02/21/1945 69 y.o. 09/11/2014   Principle Diagnosis:  1. 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 2. Hemochromatosis (H63D heterozygote mutation). 3. Asthma. 4. Mitochondrial myopathy.  Current Therapy:   1. Phlebotomy to maintain hematocrit below 42%. 2. Aspirin 81 mg p.o. daily.     Interim History:  Ms.  [name] is coming for followup.  She actually looks quite good. She does feel somewhat tired.  Her breathing still is a problem. She has seen her new pulmonologist, Dr. Malvin Johns and she likes a lot. I know that he will do a good job with her.   She saw her neurologist at [facility] after we saw her. She stopped the vitamin B6.    she has had no problems with fever. She has had no issues with nausea or vomiting. There's been no change in bowel or bladder habits.  Overall, her performance status is ECOG 1-2..  Medications:  Current outpatient prescriptions:  .  albuterol (PROAIR HFA) 108 (90 BASE) MCG/ACT inhaler, Inhale 2 puffs into the lungs every 4 (four) hours as needed for wheezing or shortness of breath., Disp: 1 Inhaler, Rfl: 3 .  aspirin 81 MG tablet, Take 81 mg by mouth daily., Disp: , Rfl:  .  Estradiol-Norethindrone Acet (LOPREEZA) 0.5-0.1 MG per tablet, Take 1 tablet by mouth daily., Disp: , Rfl:  .  hydrocortisone valerate cream (WESTCORT) 0.2 %, Apply 1 application topically 2 (two) times daily as needed (itching). , Disp: , Rfl:  .  LevOCARNitine L-Tartrate (L-CARNITINE) 500 MG CAPS, Take 2 capsules (1,000 mg total) by mouth daily., Disp: 60 capsule, Rfl: 4 .  LORazepam (ATIVAN) 0.5 MG tablet, TAKE 1 TABLET BY MOUTH EVERY NIGHT AT BEDTIME, Disp: 30 tablet, Rfl: 0 .  Melatonin 3 MG TABS, Take 3 mg by mouth at bedtime. , Disp: , Rfl:  .  QVAR 80 MCG/ACT inhaler, INHALE 2 PUFFS INTO THE LUNGS TWICE DAILY., Disp: 8.7 g, Rfl: 3 .  SPIRIVA RESPIMAT 2.5 MCG/ACT AERS, 1 puff once day, Disp: , Rfl: 5 .  Vitamin D,  Ergocalciferol, (DRISDOL) 50000 UNITS CAPS capsule, Take 50,000 Units by mouth every Tuesday. , Disp: , Rfl:  .  Wheat Dextrin (CLEAR SOLUBLE FIBER) POWD, Take 5 mLs by mouth every morning. , Disp: , Rfl:  No current facility-administered medications for this visit.  Facility-Administered Medications Ordered in Other Visits:  .  0.9 %  sodium chloride infusion, , Intravenous, Continuous, [name], MD  Allergies: No Known Allergies  Past Medical History, Surgical history, Social history, and Family History were reviewed and updated.  Review of Systems: As above  Physical Exam:  height is 5\' 3"  (1.6 m) and weight is 141 lb (63.957 kg). Her oral temperature is 97.7 F (36.5 C). Her blood pressure is 123/66 and her pulse is 92. Her respiration is 16.   Fairly well developed and well-nourished white female. Head and neck exam shows no ocular or oral lesions. There are no palpable cervical or supraclavicular lymph nodes. Lungs are clear. Cardiac exam regular rate and rhythm with no murmurs, rubs or bruits. Abdomen is soft. She has good bowel sounds. There is no fluid wave. There is no palpable liver or spleen tip. Extremities shows no clubbing, cyanosis or edema. She has decent muscle tone. She has some muscle atrophy bilaterally. Neurological exam is non-focal. Skin exam no rashes.  Lab Results  Component Value Date   WBC 8.9 09/11/2014   HGB 14.6 09/11/2014  HCT 42.8 09/11/2014   MCV 94 09/11/2014   PLT 178 09/11/2014     Chemistry      Component Value Date/Time   NA 136 07/31/2014 1044   NA 137 04/03/2014 1033   K 4.0 07/31/2014 1044   K 4.1 04/03/2014 1033   CL 105 07/31/2014 1044   CL 99 04/03/2014 1033   CO2 23 07/31/2014 1044   CO2 27 04/03/2014 1033   BUN 12 07/31/2014 1044   BUN 10 04/03/2014 1033   CREATININE 0.62 07/31/2014 1044   CREATININE 0.7 04/03/2014 1033      Component Value Date/Time   CALCIUM 8.7 07/31/2014 1044   CALCIUM 8.9 04/03/2014 1033    ALKPHOS 58 07/31/2014 1044   ALKPHOS 61 04/03/2014 1033   AST 34 07/31/2014 1044   AST 36 04/03/2014 1033   ALT 37* 07/31/2014 1044   ALT 28 04/03/2014 1033   BILITOT 0.8 07/31/2014 1044   BILITOT 1.00 04/03/2014 1033         Impression and Plan: [name] is 69 year old female who. She has polycythemia. She has hemachromatosis. She has mitochondrial myopathy.  She now has an essential tremor. Is being seen at [facility].  She is off her vitamin B6. i thought maybe  This would help her out. However, the doctor at [facility] do not think that vitamin B 6 would be helpful.  She is happy with her new pulmonologist. He is being very proactive and trying to do a candidate try to help her underlying lung function. Personally, I think that her lungs will get better once the summer fishes and the humidity is out of the ear.   We will go ahead and give her a phlebotomy today.   I will get her back in 6 weeks. [name], MD 8/16/201612:56 PM

## 2014-09-18 ENCOUNTER — Ambulatory Visit: Payer: PRIVATE HEALTH INSURANCE | Admitting: Pulmonary Disease

## 2014-09-25 ENCOUNTER — Telehealth: Payer: Self-pay | Admitting: *Deleted

## 2014-09-25 ENCOUNTER — Other Ambulatory Visit: Payer: Self-pay | Admitting: *Deleted

## 2014-09-25 DIAGNOSIS — E559 Vitamin D deficiency, unspecified: Secondary | ICD-10-CM

## 2014-09-25 DIAGNOSIS — D45 Polycythemia vera: Secondary | ICD-10-CM

## 2014-09-25 NOTE — Telephone Encounter (Signed)
Patient wants to know if she still needs to take vitamin D. A level hasn't been drawn since May 2016 when it was borderline low. Will draw a vitamin D level during her appointment in September to establish medication supplement need.

## 2014-10-03 DIAGNOSIS — G713 Mitochondrial myopathy, not elsewhere classified: Secondary | ICD-10-CM | POA: Diagnosis not present

## 2014-10-03 DIAGNOSIS — D510 Vitamin B12 deficiency anemia due to intrinsic factor deficiency: Secondary | ICD-10-CM | POA: Diagnosis not present

## 2014-10-03 DIAGNOSIS — Z6824 Body mass index (BMI) 24.0-24.9, adult: Secondary | ICD-10-CM | POA: Diagnosis not present

## 2014-10-03 DIAGNOSIS — F329 Major depressive disorder, single episode, unspecified: Secondary | ICD-10-CM | POA: Diagnosis not present

## 2014-10-08 ENCOUNTER — Ambulatory Visit (INDEPENDENT_AMBULATORY_CARE_PROVIDER_SITE_OTHER): Payer: Medicare Other | Admitting: Neurology

## 2014-10-08 ENCOUNTER — Encounter: Payer: Self-pay | Admitting: Neurology

## 2014-10-08 VITALS — BP 120/78 | HR 94 | Ht 63.0 in | Wt 143.2 lb

## 2014-10-08 DIAGNOSIS — G729 Myopathy, unspecified: Secondary | ICD-10-CM

## 2014-10-08 DIAGNOSIS — G713 Mitochondrial myopathy, not elsewhere classified: Secondary | ICD-10-CM

## 2014-10-08 NOTE — Patient Instructions (Addendum)
Increase carnitine to 1000mg  three times daily.  If tolerating, after one month, increase to three tablets three times daily. If you choose to start home therapy for safety evaluation, please call my office. Encouraged to use a rollator Return to clinic as needed

## 2014-10-08 NOTE — Progress Notes (Signed)
[facility] Neurology Division Clinic Note - Initial Visit   Date: 10/08/2014   [name] MRN: [id number] DOB: October 09, 1945   Dear Dr. Link Snuffer:  Thank you for your kind referral of [name] for consultation of mitochondrial myopathy. Although her history is well known to you, please allow [place] to reiterate it for the purpose of our medical record. The patient was accompanied to the clinic by self.    History of Present Illness: [name] is a 69 y.o. 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    She met developmental milestones on time.  She recalls reading very early at the age of 1 because it was something she could do any enjoy.  She hated going to the park or participating in physical education. Nevertheless, she remains socially active and still enjoyed which she was able to do. As the years progressed, she started developing increased generalized weakness of her arms and legs. She was initially evaluated in the early 2007 and underwent electrodiagnostic testing that was suggestive of myopathic changes, and subsequently had a muscle biopsy in 2004. The findings on the muscle biopsy of the left VL were nonspecific. There were no true ragged red fibers, no vacuoles, no inflammation or fibrosis, some clustering of mitochondria on EM but no frank clear mitochondrial abnormality. There was some glycogen accumulation in a few fibers and muscle staining was performed which demonstrated normal PFK staining. She had negative genetic testing for CPTII and McArdle's. Labs from 2005 demonstrated a normal CK of 63. 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   Patient is quite  aware of the fact that there is no curative treatment but because her fatigue was worsening over the past several years, she was reevaluated at [facility] by Dr. Garen Lah in June 2016. Repeat electrodiagnostic testing showed chronic no necrotizing myopathic findings. They suggested holding him some sequencing, repeat muscle biopsy, or observation. Patient decided not to pursue any additional workup and would like to establish her ongoing care locally.  Currently, she is most bothered by the fact that she is unable to keep up with activities as previously able. She is very happily married and is able to keep up with ADLs.  She is fatigued all the time so has no energy to go out and enjoy simple hobbies such as shopping.  She started using a cane since July 2016. She denies any recent falls.  She reports to being depressed because she cannot do as much as she wants. Denies any suicidal ideation. She was started Lexapro 10mg  daily last week.  She has two grown healthy children.  Her parents were first cousins. She has one brother 54 years older than her who is healthy. Her mother had debilitating asthma.  She passed away at the age of 71 and was ambulatory.  No family history of known mitochondrial disease.  Denies double vision, dysphagia, or dysarthria.  Her weakness is generalized weakness of her arms and legs.  She has shortness of breath due to asthma and COPD.   She goes to a trainer twice per week and has been lifting 2lb bicep curls for the past several years.     Out-side paper records, electronic medical record, and images have  been reviewed where available and summarized as:  2004 muscle biopsy left vastus lateralis - [facility]: nonspecific changes.Normal PFK staining. 2005 urine organic acids nonspecific findings, UA trace LE With 5-50 bacteria, zinc normal, acylcarnitine normal, LFTs normal, zinc normal, CK 63, CBC normal, uric acid 4.0, L-carnitine profile normal, Mg 1.9, vit E normal,  Vit A 109 (mildly elevated), lactate normal.   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 Testing for McArdle disease has previously been negative.  Labs 03/28/2014: normal CK, B-12, lactate, thyroid panel; mildly elevated pyruvic acid 0.20 and 1.8  NCS/EMG 03/28/2014: This is an abnormal study. There is no evidence of peripheral neuropathy involving large fibers. There is evidence of a generalized, non-disfigurative myopathy of long duration.   Past Medical History  Diagnosis Date  . Pericarditis     age 89  . Pneumothorax   . Anxiety   . Iliotibial band syndrome     left knee  . Vitamin D deficiency   . Migraine   . Asthma   . Anemia   . IBS (irritable bowel syndrome)   . Hemorrhoids   . Cholelithiasis   . Mitochondrial myopathy   . Heart attack   . Osteoporosis   . Allergy   . Blood transfusion without reported diagnosis   . 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 PVC's (premature ventricular contractions)   . Normal coronary arteries     by cardiac catheterization performed by myself 02/25/01    Past Surgical History  Procedure Laterality Date  . Appendectomy    . Cesarean section      x 4  . Nasal sinus surgery    . Vein surgery    . Cardiac catheterization    . US echocardiography  12/25/2010    normal  . Nm myocar perf wall motion  04/27/2006    normal     Medications:  Outpatient Encounter Prescriptions as of 10/08/2014  Medication Sig Note  . albuterol (PROAIR HFA) 108 (90 BASE) MCG/ACT inhaler Inhale 2 puffs into the lungs every 4 (four) hours as needed for wheezing or shortness of breath.   Marland Kitchen aspirin 81 MG tablet Take 81 mg by mouth daily.   Marland Kitchen escitalopram (LEXAPRO) 10 MG tablet TK 1 T PO QD IN THE EVE 10/08/2014: Received from: External Pharmacy  . Estradiol-Norethindrone Acet  (LOPREEZA) 0.5-0.1 MG per tablet Take 1 tablet by mouth daily.   . Estradiol-Norethindrone Acet (LOPREEZA) 0.5-0.1 MG per tablet Take 1 tablet by mouth daily.   . hydrocortisone valerate cream (WESTCORT) 0.2 % Apply 1 application topically 2 (two) times daily as needed (itching).    . LevOCARNitine L-Tartrate (L-CARNITINE) 500 MG CAPS Take 2 capsules (1,000 mg total) by mouth daily.   Marland Kitchen LORazepam (ATIVAN) 0.5 MG tablet TAKE 1 TABLET BY MOUTH EVERY NIGHT AT BEDTIME   . Melatonin 3 MG TABS Take 3 mg by mouth at bedtime.    Marland Kitchen QVAR 80 MCG/ACT inhaler INHALE 2 PUFFS INTO THE LUNGS TWICE DAILY.   Marland Kitchen SPIRIVA RESPIMAT 2.5 MCG/ACT AERS 1 puff once day 07/31/2014: Received from: External Pharmacy  . Vitamin D, Ergocalciferol, (DRISDOL) 50000 UNITS CAPS capsule Take 50,000 Units by mouth every Tuesday.    . Wheat Dextrin (CLEAR SOLUBLE FIBER) POWD Take 5 mLs by mouth every morning.     No facility-administered encounter medications on file as of 10/08/2014.  Allergies: No Known Allergies  Family History: Family History  Problem Relation Age of Onset  . Asthma Mother   . Arthritis Mother   . Depression Mother   . Cancer Father   . Emphysema Father   . Stroke Father   . Alcohol abuse Father   . Heart attack Father   . Colon cancer Neg Hx     Social History: Social History  Substance Use Topics  . Smoking status: Never Smoker   . Smokeless tobacco: Never Used     Comment: never used tobacco  . Alcohol Use: 1.2 oz/week    2 Glasses of wine per week     Comment: occasionally   Social History   Social History Narrative   Health Care POA:    Emergency Contact: husband, Truman Hayward, (c) [phone]   End of Life Plan:    Who lives with you: husband   Any pets: none   Diet: Pt has a varied diet.  Eats 5 sm. meals throughout day, focuses on protein, doesn't care for fruits and vegetables very much.   Exercise: Pt has a personal training and exercises several times a week.   Seatbelts: Pt  reports wearing seatbelt when in vehicle.   [name] Exposure/Protection: Pt reports wearing sun protection.    Hobbies: reading, visiting with friends   Patient has a Event organiser.   Patient has two children.   Patient is retired.   Patient does not drink any caffeine.   Patient is right handed.             Review of Systems:  CONSTITUTIONAL: No fevers, chills, night sweats, or weight loss.   EYES: No visual changes or eye pain ENT: No hearing changes.  No history of nose bleeds.   RESPIRATORY: No cough, wheezing and shortness of breath.   CARDIOVASCULAR: Negative for chest pain, and palpitations.   GI: Negative for abdominal discomfort, blood in stools or black stools.  No recent change in bowel habits.   GU:  No history of incontinence.   MUSCLOSKELETAL: No history of joint pain or swelling.  No myalgias.   SKIN: Negative for lesions, rash, and itching.   HEMATOLOGY/ONCOLOGY: Negative for prolonged bleeding, bruising easily, and swollen nodes.  No history of cancer.   ENDOCRINE: Negative for cold or heat intolerance, polydipsia or goiter.   PSYCH:  +depression or anxiety symptoms.   NEURO: As Above.   Vital Signs:  BP 120/78 mmHg  Pulse 94  Ht 5\' 3"  (1.6 m)  Wt 143 lb 3 oz (64.949 kg)  BMI 25.37 kg/m2  SpO2 97% Pain Scale: 0 on a scale of 0-10   General Medical Exam:   General:  Depressed-appearing, comfortable.   Eyes/ENT: see cranial nerve examination.   Neck: No masses appreciated.  Full range of motion without tenderness.  No carotid bruits. Respiratory:  Clear to auscultation, good air entry bilaterally.   Cardiac:  Regular rate and rhythm, no murmur.   Extremities:  No deformities, edema, or skin discoloration.  Skin:  No rashes or lesions.  Neurological Exam: MENTAL STATUS including orientation to time, place, person, recent and remote memory, attention span and concentration, language, and fund of knowledge is normal.  Speech is not dysarthric.  CRANIAL  NERVES: II:  No visual field defects.  Unremarkable fundi.   III-IV-VI: Pupils equal round and reactive to light.  Normal conjugate, extra-ocular eye movements in all directions of gaze.  No nystagmus.  No ptosis.   V:  Normal facial sensation. VII: Mild facial diplegia and weakness of the buccinator muscles bilaterally. Orbicularis oris, orbicularis oculi, frontalis, and masseter are full and intact.   VIII:  Normal hearing and vestibular function.   IX-X:  Normal palatal movement.   XI:  Normal shoulder shrug and head rotation.   XII:  Normal tongue strength and range of motion, no deviation or fasciculation.  MOTOR:  No atrophy, fasciculations or abnormal movements.  No pronator drift.  Tone is normal.   Neck flexion 4+/5, neck extension 5/5  Right Upper Extremity:    Left Upper Extremity:    Deltoid  5/5   Deltoid  5/5   Biceps  5/5   Biceps  5/5   Triceps  5/5   Triceps  5/5   Wrist extensors  5/5   Wrist extensors  5/5   Wrist flexors  5/5   Wrist flexors  5/5   Finger extensors  5/5   Finger extensors  5/5   Finger flexors  5/5   Finger flexors  5/5   Dorsal interossei  5/5   Dorsal interossei  5/5   Abductor pollicis  5/5   Abductor pollicis  5/5   Tone (Ashworth scale)  0  Tone (Ashworth scale)  0   Right Lower Extremity:    Left Lower Extremity:    Hip flexors  4+/5   Hip flexors  4+/5   Hip extensors  5/5   Hip extensors  5/5   Knee flexors  5/5   Knee flexors  5/5   Knee extensors  5/5   Knee extensors  5/5   Dorsiflexors  5/5   Dorsiflexors  5/5   Plantarflexors  5/5   Plantarflexors  5/5   Toe extensors  5/5   Toe extensors  5/5   Toe flexors  5/5   Toe flexors  5/5   Tone (Ashworth scale)  0  Tone (Ashworth scale)  0   MSRs:  Right                                                                 Left brachioradialis 2+  brachioradialis 2+  biceps 2+  biceps 2+  triceps 2+  triceps 2+  patellar 1+  patellar 1+  ankle jerk 0  ankle jerk 0  Hoffman no  Hoffman no   plantar response down  plantar response down   SENSORY: Absent vibration and impaired pinprick as well as temperature distal to ankles bilaterally.  Romberg's sign absent.   COORDINATION/GAIT: Normal finger-to- nose-finger.  Intact rapid alternating movements bilaterally.  Able to rise from a chair without using arms.  Gait narrow based and stable. 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  She had underwent extensive testing at [facility] care centers, including most recently at [facility] in July 2016, including muscle biopsies.  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 At this juncture, management goals are symptomatic and aimed at maximizing her functional independence and quality of life. Her primary complaint is fatigue, so we discusses further increasing her carnitine to 5-10g/day.  She is currently taking  carnitine 1g/d.  She was started on Lexapro 10mg  last week to help with depression.  PLAN/RECOMMENDATIONS:  Increase carnitine to 1000mg  three times daily.  If tolerating, after one month, increase to three tablets three times daily. Home safety evaluation with home OT recommended, but she will call when interested Encouraged to use a rollator so she is able to rest as needed Return to clinic as needed   The duration of this appointment visit was 60 minutes of face-to-face time with the patient.  Greater than 50% of this time was spent in counseling, explanation of diagnosis, planning of further management, and coordination of care.   Thank you for allowing me to participate in patient's care.  If I can answer any additional questions, I would be pleased to do so.    Sincerely,    Montravious Weigelt K. Allena Katz, DO

## 2014-10-09 DIAGNOSIS — G713 Mitochondrial myopathy, not elsewhere classified: Secondary | ICD-10-CM | POA: Insufficient documentation

## 2014-10-10 NOTE — Progress Notes (Signed)
Note routed

## 2014-10-18 ENCOUNTER — Other Ambulatory Visit: Payer: Self-pay | Admitting: *Deleted

## 2014-10-18 MED ORDER — BECLOMETHASONE DIPROPIONATE 80 MCG/ACT IN AERS: INHALATION_SPRAY | RESPIRATORY_TRACT | Status: DC

## 2014-10-23 ENCOUNTER — Other Ambulatory Visit (HOSPITAL_BASED_OUTPATIENT_CLINIC_OR_DEPARTMENT_OTHER): Payer: Medicare Other

## 2014-10-23 ENCOUNTER — Ambulatory Visit (HOSPITAL_BASED_OUTPATIENT_CLINIC_OR_DEPARTMENT_OTHER): Payer: Medicare Other | Admitting: Family

## 2014-10-23 ENCOUNTER — Ambulatory Visit (HOSPITAL_BASED_OUTPATIENT_CLINIC_OR_DEPARTMENT_OTHER): Payer: Medicare Other

## 2014-10-23 ENCOUNTER — Encounter: Payer: Self-pay | Admitting: Family

## 2014-10-23 VITALS — BP 129/67 | HR 87 | Temp 97.6°F | Resp 16 | Ht 63.0 in | Wt 142.0 lb

## 2014-10-23 DIAGNOSIS — D45 Polycythemia vera: Secondary | ICD-10-CM | POA: Diagnosis not present

## 2014-10-23 DIAGNOSIS — F411 Generalized anxiety disorder: Secondary | ICD-10-CM

## 2014-10-23 DIAGNOSIS — Z23 Encounter for immunization: Secondary | ICD-10-CM

## 2014-10-23 DIAGNOSIS — E559 Vitamin D deficiency, unspecified: Secondary | ICD-10-CM

## 2014-10-23 LAB — CBC WITH DIFFERENTIAL (CANCER CENTER ONLY)
BASO#: 0 10*3/uL (ref 0.0–0.2)
BASO%: 0.3 % (ref 0.0–2.0)
EOS%: 1 % (ref 0.0–7.0)
Eosinophils Absolute: 0.1 10*3/uL (ref 0.0–0.5)
HCT: 40 % (ref 34.8–46.6)
HGB: 13.2 g/dL (ref 11.6–15.9)
LYMPH#: 0.8 10*3/uL — ABNORMAL LOW (ref 0.9–3.3)
LYMPH%: 10.1 % — ABNORMAL LOW (ref 14.0–48.0)
MCH: 30.6 pg (ref 26.0–34.0)
MCHC: 33 g/dL (ref 32.0–36.0)
MCV: 93 fL (ref 81–101)
MONO#: 0.7 10*3/uL (ref 0.1–0.9)
MONO%: 8.8 % (ref 0.0–13.0)
NEUT#: 6.1 10*3/uL (ref 1.5–6.5)
NEUT%: 79.8 % (ref 39.6–80.0)
Platelets: 194 10*3/uL (ref 145–400)
RBC: 4.31 10*6/uL (ref 3.70–5.32)
RDW: 13.2 % (ref 11.1–15.7)
WBC: 7.6 10*3/uL (ref 3.9–10.0)

## 2014-10-23 LAB — COMPREHENSIVE METABOLIC PANEL (CC13)
ALT: 25 U/L (ref 0–55)
AST: 25 U/L (ref 5–34)
Albumin: 3.3 g/dL — ABNORMAL LOW (ref 3.5–5.0)
Alkaline Phosphatase: 65 U/L (ref 40–150)
Anion Gap: 10 mEq/L (ref 3–11)
BUN: 8.6 mg/dL (ref 7.0–26.0)
CO2: 23 mEq/L (ref 22–29)
Calcium: 8.9 mg/dL (ref 8.4–10.4)
Chloride: 103 mEq/L (ref 98–109)
Creatinine: 0.7 mg/dL (ref 0.6–1.1)
EGFR: 87 mL/min/{1.73_m2} — ABNORMAL LOW (ref >90)
Glucose: 104 mg/dl (ref 70–140)
Potassium: 4.1 mEq/L (ref 3.5–5.1)
Sodium: 135 mEq/L — ABNORMAL LOW (ref 136–145)
Total Bilirubin: 0.96 mg/dL (ref 0.20–1.20)
Total Protein: 6.6 g/dL (ref 6.4–8.3)

## 2014-10-23 MED ORDER — INFLUENZA VAC SPLIT QUAD 0.5 ML IM SUSY
0.5000 mL | PREFILLED_SYRINGE | INTRAMUSCULAR | Status: AC
  Administered 2014-10-23: 0.5 mL via INTRAMUSCULAR
  Filled 2014-10-23: qty 0.5

## 2014-10-23 NOTE — Patient Instructions (Signed)

## 2014-10-23 NOTE — Progress Notes (Signed)
Hematology and Oncology Follow Up Visit  [name] [id number] 1945/05/15 69 y.o. 10/23/2014   Principle Diagnosis:  1. 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 2. Hemochromatosis (H63D heterozygote mutation). 3. Mitochondrial myopathy.  Current Therapy:   1. Phlebotomy to maintain hematocrit below 42%. 2. Aspirin 81 mg p.o. daily.    Interim History:  [name] is here today with a friend for follow-up. She is having problem with depression and fatigue. She recently saw her PCP and started taking Lexapro 2 1/2 weeks ago. She is really hoping this will help.  Her last phlebotomy was in August for  Hct of 42.8. Today her Hct is 40.0. She also has had vaginal bleeding for quite a while and has an appointment with gynecology on Monday (10/3) for a D&C. She had been on HRT for years.  She denies fever, n/v, cough, rash, dizziness, chest pain, palpitations, abdominal pain or changes in bowel of bladder habits. She has some SOB with exertion which is unchanged from her norm. She still does not think she has COPD. She has had some issues with bladder infections while on Spiriva. She plans to speak with her pulmonologist about this and possibly make a medication change.  No swelling in her extremities.  She is eating well and staying hydrated. Her weight is stable.   Medications:    Medication List       This list is accurate as of: 10/23/14 12:35 PM.  Always use your most recent med list.               albuterol 108 (90 BASE) MCG/ACT inhaler  Commonly known as:  PROAIR HFA  Inhale 2 puffs into the lungs every 4 (four) hours as needed for wheezing or shortness of breath.     aspirin 81 MG tablet  Take 81 mg by mouth daily.     B-12 1000 MCG/ML Kit  Inject 1 applicator as directed every 30 (thirty) days. Last given 9/7     beclomethasone 80 MCG/ACT inhaler  Commonly known as:  QVAR  INHALE 2 PUFFS INTO THE LUNGS TWICE DAILY.     CLEAR SOLUBLE FIBER Powd  Take 5 mLs by mouth  every morning.     escitalopram 10 MG tablet  Commonly known as:  LEXAPRO  TK 1 T PO QD IN THE EVE     hydrocortisone valerate cream 0.2 %  Commonly known as:  WESTCORT  Apply 1 application topically 2 (two) times daily as needed (itching).     L-Carnitine 500 MG Caps  Take 2 capsules (1,000 mg total) by mouth daily.     LOPREEZA 0.5-0.1 MG tablet  Generic drug:  Estradiol-Norethindrone Acet  Take 1 tablet by mouth daily.     LORazepam 0.5 MG tablet  Commonly known as:  ATIVAN  TAKE 1 TABLET BY MOUTH EVERY NIGHT AT BEDTIME     Melatonin 3 MG Tabs  Take 3 mg by mouth at bedtime.     SPIRIVA RESPIMAT 2.5 MCG/ACT Aers  Generic drug:  Tiotropium Bromide Monohydrate  1 puff once day     Vitamin D (Ergocalciferol) 50000 UNITS Caps capsule  Commonly known as:  DRISDOL  Take 50,000 Units by mouth every Sunday.        Allergies: No Known Allergies  Past Medical History, Surgical history, Social history, and Family History were reviewed and updated.  Review of Systems: All other 10 point review of systems is negative.   Physical Exam:  height is 5' 3" (  1.6 m) and weight is 142 lb (64.411 kg). Her oral temperature is 97.6 F (36.4 C). Her blood pressure is 129/67 and her pulse is 87. Her respiration is 16.   Wt Readings from Last 3 Encounters:  10/23/14 142 lb (64.411 kg)  10/08/14 143 lb 3 oz (64.949 kg)  09/11/14 141 lb (63.957 kg)    Ocular: Sclerae unicteric, pupils equal, round and reactive to light Ear-nose-throat: Oropharynx clear, dentition fair Lymphatic: No cervical or supraclavicular adenopathy Lungs no rales or rhonchi, good excursion bilaterally Heart regular rate and rhythm, no murmur appreciated Abd soft, nontender, positive bowel sounds MSK no focal spinal tenderness, no joint edema Neuro: non-focal, well-oriented, appropriate affect Breasts: Deferred  Lab Results  Component Value Date   WBC 7.6 10/23/2014   HGB 13.2 10/23/2014   HCT 40.0  10/23/2014   MCV 93 10/23/2014   PLT 194 10/23/2014   Lab Results  Component Value Date   FERRITIN 20 09/11/2014   IRON 68 09/11/2014   TIBC 309 09/11/2014   UIBC 241 09/11/2014   IRONPCTSAT 22 09/11/2014   Lab Results  Component Value Date   RETICCTPCT 1.5 06/12/2014   RBC 4.31 10/23/2014   RETICCTABS 65.9 06/12/2014   No results found for: Nils Pyle [facility] Lab Results  Component Value Date   IGGSERUM 979 02/27/2014   IGA 339 02/27/2014   IGMSERUM 129 02/27/2014   No results found for: Odetta Pink, SPEI   Chemistry      Component Value Date/Time   NA 135 09/11/2014 1146   NA 137 04/03/2014 1033   K 4.0 09/11/2014 1146   K 4.1 04/03/2014 1033   CL 103 09/11/2014 1146   CL 99 04/03/2014 1033   CO2 22 09/11/2014 1146   CO2 27 04/03/2014 1033   BUN 11 09/11/2014 1146   BUN 10 04/03/2014 1033   CREATININE 0.57* 09/11/2014 1146   CREATININE 0.7 04/03/2014 1033      Component Value Date/Time   CALCIUM 8.9 09/11/2014 1146   CALCIUM 8.9 04/03/2014 1033   ALKPHOS 62 09/11/2014 1146   ALKPHOS 61 04/03/2014 1033   AST 30 09/11/2014 1146   AST 36 04/03/2014 1033   ALT 30* 09/11/2014 1146   ALT 28 04/03/2014 1033   BILITOT 0.6 09/11/2014 1146   BILITOT 1.00 04/03/2014 1033     Impression and Plan: [name] is 69 yo female with both hemochromatosis and polycythemia. We have been able to phlebotomize her and now her Hct is below 42. She is having a hard time with depression and fatigue. She started Lexapro almost 3 weeks ago and is hopeful that this will make a difference.  She is still receiving B 12 injections. Her last dose was on 9/7. She will not need to be phlebotomized today.  She will continue taking her baby aspirin daily.  We will plan to see her back in 6 weeks for labs and follow-up.  She know to contact us with any questions or concerns. We can certainly see her sooner if need be.     Eliezer Bottom, NP 9/27/201612:35 PM

## 2014-10-24 ENCOUNTER — Encounter (HOSPITAL_COMMUNITY)
Admission: RE | Admit: 2014-10-24 | Discharge: 2014-10-24 | Disposition: A | Discharge status: Home / Self Care | Payer: Medicare Other | Source: Ambulatory Visit | Attending: Obstetrics and Gynecology | Admitting: Obstetrics and Gynecology

## 2014-10-24 ENCOUNTER — Encounter (HOSPITAL_COMMUNITY): Payer: Self-pay

## 2014-10-24 DIAGNOSIS — N950 Postmenopausal bleeding: Secondary | ICD-10-CM | POA: Insufficient documentation

## 2014-10-24 DIAGNOSIS — Z01818 Encounter for other preprocedural examination: Secondary | ICD-10-CM | POA: Diagnosis not present

## 2014-10-24 HISTORY — DX: Chronic kidney disease, unspecified: N18.9

## 2014-10-24 LAB — IRON AND TIBC CHCC
%SAT: 18 % — ABNORMAL LOW (ref 21–57)
Iron: 64 ug/dL (ref 41–142)
TIBC: 349 ug/dL (ref 236–444)
UIBC: 285 ug/dL (ref 120–384)

## 2014-10-24 LAB — FERRITIN CHCC: Ferritin: 17 ng/ml (ref 9–269)

## 2014-10-24 LAB — VITAMIN D 25 HYDROXY (VIT D DEFICIENCY, FRACTURES): Vit D, 25-Hydroxy: 47 ng/mL (ref 30–100)

## 2014-10-24 NOTE — Pre-Procedure Instructions (Signed)
Patient had EKG from 05/08/14. Dr. Smith Robert reviewed and no orders received.

## 2014-10-24 NOTE — Patient Instructions (Signed)
Your procedure is scheduled on:  October 29, 2014    Enter through the Main Entrance of [facility] at:  6:00 am   Pick up the phone at the desk and dial [phone].  Call this number if you have problems the morning of surgery: [phone].  Remember: Do NOT eat food:  After midnight on Sunday  Do NOT drink clear liquids after: midnight on Sunday  Take these medicines the morning of surgery with a SIP OF WATER:  None  Bring Inhalers with you day of surgery   Do NOT wear jewelry (body piercing), metal hair clips/bobby pins, make-up, or nail polish. Do NOT wear lotions, powders, or perfumes.  You may wear deoderant. Do NOT shave for 48 hours prior to surgery. Do NOT bring valuables to the hospital. Contacts, dentures, or bridgework may not be worn into surgery. Have a responsible adult drive you home and stay with you for 24 hours after your procedure

## 2014-10-28 NOTE — H&P (Signed)
[name] is an 69 y.o. female  G2P2  with recurrent postmenopausal bleeding and  an inconclusive SIUS, then EMB that was benign but demonstrated a possible polyp fragment. She had another episode of bleeding recently that lasted several days, not heavy and stopped on its own. She is on Lopreeza and really feels much better on it and wants to continue.  We are going to do a diagnostic hysteroscopy and remove any polyps present.  Pertinent Gynecological History: OB History: NSVD x 1                     C/S x 1   Menstrual History:  No LMP recorded. Patient is postmenopausal.    Past Medical History  Diagnosis Date  . Pericarditis     age 48  . Pneumothorax   . Anxiety   . Iliotibial band syndrome     left knee  . Vitamin D deficiency   . Migraine   . Asthma   . Anemia   . IBS (irritable bowel syndrome)   . Hemorrhoids   . Cholelithiasis   . Mitochondrial myopathy   . Heart attack   . Osteoporosis   . Allergy   . Blood transfusion without reported diagnosis   . 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 PVC's (premature ventricular contractions)   . Normal coronary arteries     by cardiac catheterization performed by myself 02/25/01  . Chronic kidney disease     gallstones     Past Surgical History  Procedure Laterality Date  . Appendectomy    . Cesarean section      x 4  . Nasal sinus surgery    . Vein surgery    . Cardiac catheterization    . US echocardiography  12/25/2010    normal  . Nm myocar perf wall motion  04/27/2006    normal  . Mouth surgery    . Colonoscopy      Family History  Problem Relation Age of Onset  . Asthma Mother   . Arthritis Mother   . Depression Mother   . Cancer Father   . Emphysema Father   . Stroke Father   . Alcohol abuse Father   . Heart attack Father   . Colon cancer Neg Hx     Social History:  reports that she has never smoked. She has never used smokeless tobacco. She reports that she drinks about 1.2 oz of alcohol  per week. She reports that she does not use illicit drugs.  Allergies: No Known Allergies  No prescriptions prior to admission    Review of Systems  Gastrointestinal: Negative for abdominal pain.  Genitourinary:       Postmenopausal bleeding    There were no vitals taken for this visit. Physical Exam  Constitutional: She appears well-developed and well-nourished.  Cardiovascular: Normal rate and regular rhythm.   Respiratory: Effort normal.  GI: Soft.  Genitourinary: Vagina normal and uterus normal.  Musculoskeletal:  Generalized weakness, but no focal deficits  Neurological: She is alert.  Psychiatric: She has a normal mood and affect.    No results found for this or any previous visit (from the past 24 hour(s)).  No results found.  Assessment/Plan: Pt was counseled on the risks and benefits of surgery including bleeding and uterine perforation.  She is going to use cytotec about 3 hours prior to procedure to reduce this risk.  We discussed removing any polyps present with the [facility] device.  She desires to proceed.  Logan Bores 10/28/2014, 4:21 PM

## 2014-10-29 ENCOUNTER — Ambulatory Visit (HOSPITAL_COMMUNITY)
Admission: RE | Admit: 2014-10-29 | Discharge: 2014-10-29 | Disposition: A | Discharge status: Home / Self Care | Payer: Medicare Other | Source: Ambulatory Visit | Attending: Obstetrics and Gynecology | Admitting: Obstetrics and Gynecology

## 2014-10-29 ENCOUNTER — Ambulatory Visit (HOSPITAL_COMMUNITY): Payer: Medicare Other | Admitting: Anesthesiology

## 2014-10-29 ENCOUNTER — Encounter (HOSPITAL_COMMUNITY)
Admission: RE | Disposition: A | Discharge status: Home / Self Care | Payer: Self-pay | Source: Ambulatory Visit | Attending: Obstetrics and Gynecology

## 2014-10-29 DIAGNOSIS — I252 Old myocardial infarction: Secondary | ICD-10-CM | POA: Insufficient documentation

## 2014-10-29 DIAGNOSIS — N189 Chronic kidney disease, unspecified: Secondary | ICD-10-CM | POA: Diagnosis not present

## 2014-10-29 DIAGNOSIS — N841 Polyp of cervix uteri: Secondary | ICD-10-CM | POA: Diagnosis not present

## 2014-10-29 DIAGNOSIS — M810 Age-related osteoporosis without current pathological fracture: Secondary | ICD-10-CM | POA: Diagnosis not present

## 2014-10-29 DIAGNOSIS — F419 Anxiety disorder, unspecified: Secondary | ICD-10-CM | POA: Diagnosis not present

## 2014-10-29 DIAGNOSIS — N950 Postmenopausal bleeding: Secondary | ICD-10-CM | POA: Diagnosis not present

## 2014-10-29 DIAGNOSIS — N840 Polyp of corpus uteri: Secondary | ICD-10-CM | POA: Diagnosis not present

## 2014-10-29 DIAGNOSIS — J449 Chronic obstructive pulmonary disease, unspecified: Secondary | ICD-10-CM | POA: Insufficient documentation

## 2014-10-29 DIAGNOSIS — D649 Anemia, unspecified: Secondary | ICD-10-CM | POA: Diagnosis not present

## 2014-10-29 DIAGNOSIS — K589 Irritable bowel syndrome without diarrhea: Secondary | ICD-10-CM | POA: Diagnosis not present

## 2014-10-29 HISTORY — PX: DILATATION & CURETTAGE/HYSTEROSCOPY WITH MYOSURE: SHX6511

## 2014-10-29 SURGERY — DILATATION & CURETTAGE/HYSTEROSCOPY WITH MYOSURE
Anesthesia: General

## 2014-10-29 MED ORDER — FENTANYL CITRATE (PF) 100 MCG/2ML IJ SOLN
INTRAMUSCULAR | Status: DC | PRN
  Administered 2014-10-29 (×2): 25 ug via INTRAVENOUS
  Administered 2014-10-29: 50 ug via INTRAVENOUS

## 2014-10-29 MED ORDER — LACTATED RINGERS IV SOLN
INTRAVENOUS | Status: DC
  Administered 2014-10-29: 06:00:00 via INTRAVENOUS

## 2014-10-29 MED ORDER — ONDANSETRON HCL 4 MG/2ML IJ SOLN
INTRAMUSCULAR | Status: DC | PRN
Start: 2014-10-29 — End: 2014-10-29
  Administered 2014-10-29: 4 mg via INTRAVENOUS

## 2014-10-29 MED ORDER — LIDOCAINE HCL 1 % IJ SOLN
INTRAMUSCULAR | Status: AC
  Filled 2014-10-29: qty 20

## 2014-10-29 MED ORDER — SODIUM CHLORIDE 0.9 % IV SOLN
INTRAVENOUS | Status: DC
  Administered 2014-10-29: 07:00:00 via INTRAVENOUS

## 2014-10-29 MED ORDER — ONDANSETRON HCL 4 MG/2ML IJ SOLN
INTRAMUSCULAR | Status: AC
  Filled 2014-10-29: qty 2

## 2014-10-29 MED ORDER — LIDOCAINE HCL 1 % IJ SOLN
INTRAMUSCULAR | Status: DC | PRN
  Administered 2014-10-29: 10 mL

## 2014-10-29 MED ORDER — SODIUM CHLORIDE 0.9 % IR SOLN
Status: DC | PRN
  Administered 2014-10-29: 3000 mL

## 2014-10-29 MED ORDER — LIDOCAINE HCL (CARDIAC) 20 MG/ML IV SOLN
INTRAVENOUS | Status: DC | PRN
  Administered 2014-10-29: 60 mg via INTRAVENOUS

## 2014-10-29 MED ORDER — FENTANYL CITRATE (PF) 100 MCG/2ML IJ SOLN
INTRAMUSCULAR | Status: AC
  Filled 2014-10-29: qty 4

## 2014-10-29 MED ORDER — PROPOFOL 10 MG/ML IV BOLUS
INTRAVENOUS | Status: DC | PRN
  Administered 2014-10-29 (×2): 50 mg via INTRAVENOUS

## 2014-10-29 MED ORDER — PROPOFOL 10 MG/ML IV BOLUS
INTRAVENOUS | Status: AC
  Filled 2014-10-29: qty 20

## 2014-10-29 MED ORDER — LIDOCAINE HCL (CARDIAC) 20 MG/ML IV SOLN
INTRAVENOUS | Status: AC
  Filled 2014-10-29: qty 5

## 2014-10-29 SURGICAL SUPPLY — 19 items
CANISTER SUCT 3000ML (MISCELLANEOUS) ×3 IMPLANT
CATH ROBINSON RED A/P 16FR (CATHETERS) ×3 IMPLANT
CLOTH BEACON ORANGE TIMEOUT ST (SAFETY) ×3 IMPLANT
CONTAINER PREFILL 10% NBF 60ML (FORM) ×6 IMPLANT
DEVICE MYOSURE CLASSIC (MISCELLANEOUS) IMPLANT
DEVICE MYOSURE LITE (MISCELLANEOUS) ×2 IMPLANT
ELECT REM PT RETURN 9FT ADLT (ELECTROSURGICAL) ×3
ELECTRODE REM PT RTRN 9FT ADLT (ELECTROSURGICAL) ×1 IMPLANT
GLOVE BIO SURGEON STRL SZ 6.5 (GLOVE) ×2 IMPLANT
GLOVE BIO SURGEONS STRL SZ 6.5 (GLOVE) ×1
GOWN STRL REUS W/TWL LRG LVL3 (GOWN DISPOSABLE) ×6 IMPLANT
MYOSURE XL FIBROID REM (MISCELLANEOUS)
PACK VAGINAL MINOR WOMEN LF (CUSTOM PROCEDURE TRAY) ×3 IMPLANT
PAD OB MATERNITY 4.3X12.25 (PERSONAL CARE ITEMS) ×3 IMPLANT
SYSTEM TISS REMOVAL MYSR XL RM (MISCELLANEOUS) IMPLANT
TOWEL OR 17X24 6PK STRL BLUE (TOWEL DISPOSABLE) ×6 IMPLANT
TUBING AQUILEX INFLOW (TUBING) ×3 IMPLANT
TUBING AQUILEX OUTFLOW (TUBING) ×3 IMPLANT
WATER STERILE IRR 1000ML POUR (IV SOLUTION) ×3 IMPLANT

## 2014-10-29 NOTE — Transfer of Care (Signed)
Immediate Anesthesia Transfer of Care Note  Patient: [name]  Procedure(s) Performed: Procedure(s): DILATATION & CURETTAGE/HYSTEROSCOPY WITH MYOSURE (N/A)  Patient Location: PACU  Anesthesia Type:General  Level of Consciousness: awake, alert  and oriented  Airway & Oxygen Therapy: Patient Spontanous Breathing and Patient connected to nasal cannula oxygen  Post-op Assessment: Report given to RN and Post -op Vital signs reviewed and stable  Post vital signs: Reviewed and stable  Last Vitals:  Filed Vitals:   10/29/14 0606  BP: 124/63  Pulse: 92  Temp: 36.4 C  Resp: 20    Complications: No apparent anesthesia complications

## 2014-10-29 NOTE — Discharge Instructions (Signed)

## 2014-10-29 NOTE — Anesthesia Preprocedure Evaluation (Addendum)
Anesthesia Evaluation  Patient identified by MRN, date of birth, ID band Patient awake    Reviewed: Allergy & Precautions, NPO status , Patient's Chart, lab work & pertinent test results  History of Anesthesia Complications Negative for: history of anesthetic complications  Airway Mallampati: II  TM Distance: >3 FB Neck ROM: Full    Dental  (+) Teeth Intact, Dental Advisory Given   Pulmonary asthma , COPD,  COPD inhaler,    Pulmonary exam normal breath sounds clear to auscultation       Cardiovascular Exercise Tolerance: Good (-) hypertension(-) anginaNormal cardiovascular exam Rhythm:Regular Rate:Normal     Neuro/Psych  Headaches, PSYCHIATRIC DISORDERS Anxiety  Neuromuscular disease (mitochondrial myopathy)    GI/Hepatic negative GI ROS, Neg liver ROS,   Endo/Other  negative endocrine ROS  Renal/GU   Female GU complaint     Musculoskeletal negative musculoskeletal ROS (+)   Abdominal   Peds  Hematology  (+) Blood dyscrasia, anemia ,   Anesthesia Other Findings Day of surgery medications reviewed with the patient.  Reproductive/Obstetrics negative OB ROS                            Anesthesia Physical Anesthesia Plan  ASA: III  Anesthesia Plan: General   Post-op Pain Management:    Induction: Intravenous  Airway Management Planned: LMA  Additional Equipment:   Intra-op Plan:   Post-operative Plan: Extubation in OR  Informed Consent: I have reviewed the patients History and Physical, chart, labs and discussed the procedure including the risks, benefits and alternatives for the proposed anesthesia with the patient or authorized representative who has indicated his/her understanding and acceptance.   Dental advisory given  Plan Discussed with: CRNA  Anesthesia Plan Comments: (Risks/benefits of general anesthesia discussed with patient including risk of damage to teeth, lips,  gum, and tongue, nausea/vomiting, allergic reactions to medications, and the possibility of heart attack, stroke and death.  All patient questions answered.  Patient wishes to proceed.)        Anesthesia Quick Evaluation

## 2014-10-29 NOTE — Anesthesia Postprocedure Evaluation (Signed)
  Anesthesia Post-op Note  Patient: [name]  Procedure(s) Performed: Procedure(s) (LRB): DILATATION & CURETTAGE/HYSTEROSCOPY WITH MYOSURE (N/A)  Patient Location: PACU  Anesthesia Type: General  Level of Consciousness: awake and alert   Airway and Oxygen Therapy: Patient Spontanous Breathing  Post-op Pain: mild  Post-op Assessment: Post-op Vital signs reviewed, Patient's Cardiovascular Status Stable, Respiratory Function Stable, Patent Airway and No signs of Nausea or vomiting  Last Vitals:  Filed Vitals:   10/29/14 0900  BP: 101/50  Pulse: 95  Temp: 36.6 C  Resp: 18    Post-op Vital Signs: stable   Complications: No apparent anesthesia complications

## 2014-10-29 NOTE — Anesthesia Procedure Notes (Signed)
Procedure Name: LMA Insertion Date/Time: 10/29/2014 7:26 AM Performed by: Jonna Munro Pre-anesthesia Checklist: Patient identified, Emergency Drugs available, Suction available, Patient being monitored and Timeout performed Patient Re-evaluated:Patient Re-evaluated prior to inductionOxygen Delivery Method: Circle system utilized Preoxygenation: Pre-oxygenation with 100% oxygen Intubation Type: IV induction LMA: LMA inserted LMA Size: 4.0 Number of attempts: 1 Tube secured with: Tape

## 2014-10-29 NOTE — Op Note (Signed)
Operative Note    Preoperative Diagnosis Postmenopausal bleeding Possible endometrial polyp  Postoperative Diagnosis same  Procedure Operative Hysteroscopy with polypectomy and endometrial sampling/currettage  Surgeon Paula Compton  Anesthesia LMA  Fluids: EBL  50cc UOP 500cc straight cath prior IVF  800cc NS Hysteroscopic deficit 300cc   Findings Posterior endometrial polyp Cavity otherwise atrophic with scant endometrial lining   Specimen Polyp and endometrial curettings  Procedure Note Patient was taken to the operating room where LMA anesthesia was obtained without difficulty. She was then prepped and draped in the normal sterile fashion in the dorsal lithotomy position. An appropriate time out was performed. A speculum was then placed within the vagina and the anterior lip of the cervix identified and injected with approximately 2 cc of 1% plain lidocaine. An additional 5cc each was placed at 2 and 10:00 for a paracervical block. Uterus was then sounded to approximately 8-9 cm and the [facility] dilators utilized to dilate the cervix up to approximately 24. The Myosure operating scope was then introduced into the cervix and the cavity inspected with findings as previously noted. The Myosure lite operating blade was then introduced through the scope and under direct visualization the polyp was removed in entirety.   Additional directed samplings of the endometrium were performed.   There was no active bleeding at the conclusion of the removal. The operating scope was then removed from the cervix a small curette inserted into the uterine fundus. A gentle curettage was performed in all 4 quadrants to sample any remaining endometrium. All tissue specimens were handed off to pathology. The tenaculum was then removed from the cervix and the site rendered hemostatic with silver nitrate. Finally the speculum was removed from the vagina and the patient awakened and taken to the recovery  room in good condition.

## 2014-10-29 NOTE — Progress Notes (Signed)
Patient ID: [name], female   DOB: 1945-11-04, 69 y.o.   MRN: [id number] Per pt, no changes in dictated H&P Brief exam WNL Ready to proceed.

## 2014-10-30 ENCOUNTER — Encounter (HOSPITAL_COMMUNITY): Payer: Self-pay | Admitting: Obstetrics and Gynecology

## 2014-10-31 ENCOUNTER — Telehealth: Payer: Self-pay | Admitting: *Deleted

## 2014-10-31 NOTE — Telephone Encounter (Signed)
Patient wants to know if she should continue taking her vitamin D after her vitamin D check last week. Level is 47. Spoke to Dr Marin Olp who wants patient to continue vitamin D supplement. Patient is aware.

## 2014-11-16 DIAGNOSIS — G713 Mitochondrial myopathy, not elsewhere classified: Secondary | ICD-10-CM | POA: Diagnosis not present

## 2014-11-16 DIAGNOSIS — D510 Vitamin B12 deficiency anemia due to intrinsic factor deficiency: Secondary | ICD-10-CM | POA: Diagnosis not present

## 2014-11-16 DIAGNOSIS — F329 Major depressive disorder, single episode, unspecified: Secondary | ICD-10-CM | POA: Diagnosis not present

## 2014-11-16 DIAGNOSIS — Z6825 Body mass index (BMI) 25.0-25.9, adult: Secondary | ICD-10-CM | POA: Diagnosis not present

## 2014-11-22 ENCOUNTER — Ambulatory Visit: Payer: Medicare Other | Admitting: Neurology

## 2014-11-27 DIAGNOSIS — Z79899 Other long term (current) drug therapy: Secondary | ICD-10-CM | POA: Diagnosis not present

## 2014-11-27 DIAGNOSIS — R8290 Unspecified abnormal findings in urine: Secondary | ICD-10-CM | POA: Diagnosis not present

## 2014-11-27 DIAGNOSIS — E559 Vitamin D deficiency, unspecified: Secondary | ICD-10-CM | POA: Diagnosis not present

## 2014-11-27 DIAGNOSIS — D510 Vitamin B12 deficiency anemia due to intrinsic factor deficiency: Secondary | ICD-10-CM | POA: Diagnosis not present

## 2014-12-04 DIAGNOSIS — Z23 Encounter for immunization: Secondary | ICD-10-CM | POA: Diagnosis not present

## 2014-12-04 DIAGNOSIS — N920 Excessive and frequent menstruation with regular cycle: Secondary | ICD-10-CM | POA: Diagnosis not present

## 2014-12-04 DIAGNOSIS — Z1389 Encounter for screening for other disorder: Secondary | ICD-10-CM | POA: Diagnosis not present

## 2014-12-04 DIAGNOSIS — Z6825 Body mass index (BMI) 25.0-25.9, adult: Secondary | ICD-10-CM | POA: Diagnosis not present

## 2014-12-04 DIAGNOSIS — M542 Cervicalgia: Secondary | ICD-10-CM | POA: Diagnosis not present

## 2014-12-04 DIAGNOSIS — D510 Vitamin B12 deficiency anemia due to intrinsic factor deficiency: Secondary | ICD-10-CM | POA: Diagnosis not present

## 2014-12-04 DIAGNOSIS — D751 Secondary polycythemia: Secondary | ICD-10-CM | POA: Diagnosis not present

## 2014-12-04 DIAGNOSIS — E538 Deficiency of other specified B group vitamins: Secondary | ICD-10-CM | POA: Diagnosis not present

## 2014-12-04 DIAGNOSIS — E559 Vitamin D deficiency, unspecified: Secondary | ICD-10-CM | POA: Diagnosis not present

## 2014-12-04 DIAGNOSIS — I8390 Asymptomatic varicose veins of unspecified lower extremity: Secondary | ICD-10-CM | POA: Diagnosis not present

## 2014-12-04 DIAGNOSIS — Z0000 Encounter for general adult medical examination without abnormal findings: Secondary | ICD-10-CM | POA: Diagnosis not present

## 2014-12-04 DIAGNOSIS — F331 Major depressive disorder, recurrent, moderate: Secondary | ICD-10-CM | POA: Diagnosis not present

## 2014-12-10 DIAGNOSIS — Z1212 Encounter for screening for malignant neoplasm of rectum: Secondary | ICD-10-CM | POA: Diagnosis not present

## 2014-12-12 ENCOUNTER — Ambulatory Visit (HOSPITAL_BASED_OUTPATIENT_CLINIC_OR_DEPARTMENT_OTHER): Payer: Medicare Other

## 2014-12-12 ENCOUNTER — Other Ambulatory Visit (HOSPITAL_BASED_OUTPATIENT_CLINIC_OR_DEPARTMENT_OTHER): Payer: Medicare Other

## 2014-12-12 ENCOUNTER — Encounter: Payer: Self-pay | Admitting: Hematology & Oncology

## 2014-12-12 ENCOUNTER — Ambulatory Visit (HOSPITAL_BASED_OUTPATIENT_CLINIC_OR_DEPARTMENT_OTHER): Payer: Medicare Other | Admitting: Hematology & Oncology

## 2014-12-12 VITALS — BP 118/57 | HR 91 | Resp 18

## 2014-12-12 VITALS — BP 129/66 | HR 90 | Temp 97.6°F | Wt 144.8 lb

## 2014-12-12 DIAGNOSIS — J45909 Unspecified asthma, uncomplicated: Secondary | ICD-10-CM

## 2014-12-12 DIAGNOSIS — D45 Polycythemia vera: Secondary | ICD-10-CM | POA: Diagnosis not present

## 2014-12-12 DIAGNOSIS — D510 Vitamin B12 deficiency anemia due to intrinsic factor deficiency: Secondary | ICD-10-CM

## 2014-12-12 DIAGNOSIS — G713 Mitochondrial myopathy, not elsewhere classified: Secondary | ICD-10-CM

## 2014-12-12 DIAGNOSIS — M810 Age-related osteoporosis without current pathological fracture: Secondary | ICD-10-CM

## 2014-12-12 DIAGNOSIS — M80041S Age-related osteoporosis with current pathological fracture, right hand, sequela: Secondary | ICD-10-CM

## 2014-12-12 LAB — CBC WITH DIFFERENTIAL (CANCER CENTER ONLY)
BASO#: 0 10*3/uL (ref 0.0–0.2)
BASO%: 0.4 % (ref 0.0–2.0)
EOS%: 1.5 % (ref 0.0–7.0)
Eosinophils Absolute: 0.1 10*3/uL (ref 0.0–0.5)
HCT: 43.6 % (ref 34.8–46.6)
HGB: 14.4 g/dL (ref 11.6–15.9)
LYMPH#: 0.9 10*3/uL (ref 0.9–3.3)
LYMPH%: 11 % — ABNORMAL LOW (ref 14.0–48.0)
MCH: 31 pg (ref 26.0–34.0)
MCHC: 33 g/dL (ref 32.0–36.0)
MCV: 94 fL (ref 81–101)
MONO#: 0.9 10*3/uL (ref 0.1–0.9)
MONO%: 10.9 % (ref 0.0–13.0)
NEUT#: 5.9 10*3/uL (ref 1.5–6.5)
NEUT%: 76.2 % (ref 39.6–80.0)
Platelets: 197 10*3/uL (ref 145–400)
RBC: 4.64 10*6/uL (ref 3.70–5.32)
RDW: 15.6 % (ref 11.1–15.7)
WBC: 7.8 10*3/uL (ref 3.9–10.0)

## 2014-12-12 LAB — COMPREHENSIVE METABOLIC PANEL (CC13)
ALT: 41 U/L (ref 0–55)
AST: 46 U/L — ABNORMAL HIGH (ref 5–34)
Albumin: 3.2 g/dL — ABNORMAL LOW (ref 3.5–5.0)
Alkaline Phosphatase: 79 U/L (ref 40–150)
Anion Gap: 10 mEq/L (ref 3–11)
BUN: 12.5 mg/dL (ref 7.0–26.0)
CO2: 24 mEq/L (ref 22–29)
Calcium: 8.8 mg/dL (ref 8.4–10.4)
Chloride: 104 mEq/L (ref 98–109)
Creatinine: 0.7 mg/dL (ref 0.6–1.1)
EGFR: 87 mL/min/{1.73_m2} — ABNORMAL LOW (ref >90)
Glucose: 113 mg/dl (ref 70–140)
Potassium: 4.5 mEq/L (ref 3.5–5.1)
Sodium: 139 mEq/L (ref 136–145)
Total Bilirubin: 0.63 mg/dL (ref 0.20–1.20)
Total Protein: 6.6 g/dL (ref 6.4–8.3)

## 2014-12-12 LAB — IRON AND TIBC CHCC
%SAT: 18 % — ABNORMAL LOW (ref 21–57)
Iron: 58 ug/dL (ref 41–142)
TIBC: 318 ug/dL (ref 236–444)
UIBC: 259 ug/dL (ref 120–384)

## 2014-12-12 LAB — FERRITIN CHCC: Ferritin: 40 ng/ml (ref 9–269)

## 2014-12-12 MED ORDER — SODIUM CHLORIDE 0.9 % IV SOLN
INTRAVENOUS | Status: DC
  Administered 2014-12-12: 13:00:00 via INTRAVENOUS

## 2014-12-12 NOTE — Progress Notes (Signed)
Hematology and Oncology Follow Up Visit  [name] [id number] 12/20/1945 69 y.o. 12/12/2014   Principle Diagnosis:  1. 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 2. Hemochromatosis (H63D heterozygote mutation). 3. Asthma. 4. Mitochondrial myopathy.  Current Therapy:   1. Phlebotomy to maintain hematocrit below 42%. 2. Aspirin 81 mg p.o. daily.     Interim History:  [name] is coming for followup.  She doesn't feel as good today. Apparently, her mitochondrial myopathy is getting worse. She says that the doctors at [facility] do not want to see her anymore as they say nothing they can offer for her to help her.  She just gets tired quite a bit.  Her asthma has been doing okay. She is using inhalers for this.   She's noticed some easy bruising. I think this is probably from her medications. l  She's had no fever. There's been no change in bowel or bladder habits. She's had no leg swelling.  Overall, her performance status is ECOG 1-2..  Medications:  Current outpatient prescriptions:  .  albuterol (PROAIR HFA) 108 (90 BASE) MCG/ACT inhaler, Inhale 2 puffs into the lungs every 4 (four) hours as needed for wheezing or shortness of breath. (Patient taking differently: Inhale 1-2 puffs into the lungs every 4 (four) hours as needed for wheezing or shortness of breath. ), Disp: 1 Inhaler, Rfl: 3 .  aspirin 81 MG tablet, Take 81 mg by mouth daily., Disp: , Rfl:  .  beclomethasone (QVAR) 80 MCG/ACT inhaler, INHALE 2 PUFFS INTO THE LUNGS TWICE DAILY., Disp: 8.7 g, Rfl: 5 .  Cyanocobalamin (B-12) 1000 MCG/ML KIT, Inject 1 applicator as directed every 30 (thirty) days. Last given 9/7, Disp: , Rfl:  .  Estradiol-Norethindrone Acet (LOPREEZA) 0.5-0.1 MG per tablet, Take 1 tablet by mouth daily., Disp: , Rfl:  .  hydrocortisone valerate cream (WESTCORT) 0.2 %, Apply 1 application topically 2 (two) times daily as needed (itching). , Disp: , Rfl:  .  LevOCARNitine L-Tartrate (L-CARNITINE) 500 MG  CAPS, Take 2 capsules (1,000 mg total) by mouth daily., Disp: 60 capsule, Rfl: 4 .  LORazepam (ATIVAN) 0.5 MG tablet, TAKE 1 TABLET BY MOUTH EVERY NIGHT AT BEDTIME, Disp: 30 tablet, Rfl: 0 .  Melatonin 3 MG TABS, Take 3 mg by mouth at bedtime. , Disp: , Rfl:  .  sertraline (ZOLOFT) 50 MG tablet, Take 50 mg by mouth every morning., Disp: , Rfl: 2 .  SPIRIVA RESPIMAT 2.5 MCG/ACT AERS, 1 puff once day, Disp: , Rfl: 5 .  Vitamin D, Ergocalciferol, (DRISDOL) 50000 UNITS CAPS capsule, Take 50,000 Units by mouth every Sunday. , Disp: , Rfl:  .  Wheat Dextrin (CLEAR SOLUBLE FIBER) POWD, Take 5 mLs by mouth every morning. , Disp: , Rfl:  No current facility-administered medications for this visit.  Facility-Administered Medications Ordered in Other Visits:  .  0.9 %  sodium chloride infusion, , Intravenous, Continuous, [name], MD, Stopped at 12/12/14 1422  Allergies: No Known Allergies  Past Medical History, Surgical history, Social history, and Family History were reviewed and updated.  Review of Systems: As above  Physical Exam:  weight is 144 lb 12.8 oz (65.681 kg). Her oral temperature is 97.6 F (36.4 C). Her blood pressure is 129/66 and her pulse is 90.   Fairly well developed and well-nourished white female. Head and neck exam shows no ocular or oral lesions. There are no palpable cervical or supraclavicular lymph nodes. Lungs are clear. Cardiac exam regular rate and rhythm with no  murmurs, rubs or bruits. Abdomen is soft. She has good bowel sounds. There is no fluid wave. There is no palpable liver or spleen tip. Extremities shows no clubbing, cyanosis or edema. She has decent muscle tone. She has some muscle atrophy bilaterally. Neurological exam is non-focal. Skin exam no rashes.  Lab Results  Component Value Date   WBC 7.8 12/12/2014   HGB 14.4 12/12/2014   HCT 43.6 12/12/2014   MCV 94 12/12/2014   PLT 197 12/12/2014     Chemistry      Component Value Date/Time   NA 139  12/12/2014 1119   NA 135 09/11/2014 1146   NA 137 04/03/2014 1033   K 4.5 12/12/2014 1119   K 4.0 09/11/2014 1146   K 4.1 04/03/2014 1033   CL 103 09/11/2014 1146   CL 99 04/03/2014 1033   CO2 24 12/12/2014 1119   CO2 22 09/11/2014 1146   CO2 27 04/03/2014 1033   BUN 12.5 12/12/2014 1119   BUN 11 09/11/2014 1146   BUN 10 04/03/2014 1033   CREATININE 0.7 12/12/2014 1119   CREATININE 0.57* 09/11/2014 1146   CREATININE 0.7 04/03/2014 1033      Component Value Date/Time   CALCIUM 8.8 12/12/2014 1119   CALCIUM 8.9 09/11/2014 1146   CALCIUM 8.9 04/03/2014 1033   ALKPHOS 79 12/12/2014 1119   ALKPHOS 62 09/11/2014 1146   ALKPHOS 61 04/03/2014 1033   AST 46* 12/12/2014 1119   AST 30 09/11/2014 1146   AST 36 04/03/2014 1033   ALT 41 12/12/2014 1119   ALT 30* 09/11/2014 1146   ALT 28 04/03/2014 1033   BILITOT 0.63 12/12/2014 1119   BILITOT 0.6 09/11/2014 1146   BILITOT 1.00 04/03/2014 1033         Impression and Plan: [name] is 69 year old female who. She has polycythemia. She has hemachromatosis. She has a mitochondrial myopathy.  We will go ahead and phlebotomize her now. Again I want to keep her hematocrit below 42%.  I just wish that there is some back to be done for this mitochondrial myopathy.  We will go ahead and plan to get her back in about 6-7 weeks. Hopefully we can get her through the holidays.    [name], MD 11/16/20162:31 PM

## 2014-12-12 NOTE — Patient Instructions (Addendum)
Therapeutic Phlebotomy, Care After  Refer to this sheet in the next few weeks. These instructions provide you with information about caring for yourself after your procedure. Your health care provider may also give you more specific instructions. Your treatment has been planned according to current medical practices, but problems sometimes occur. Call your health care provider if you have any problems or questions after your procedure.  WHAT TO EXPECT AFTER THE PROCEDURE  After your procedure, it is common to have:   Light-headedness or dizziness. You may feel faint.   Nausea.   Tiredness.  HOME CARE INSTRUCTIONS  Activities   Return to your normal activities as directed by your health care provider. Most people can go back to their normal activities right away.   Avoid strenuous physical activity and heavy lifting or pulling for about 5 hours after the procedure. Do not lift anything that is heavier than 10 lb (4.5 kg).   Athletes should avoid strenuous exercise for at least 12 hours.   Change positions slowly for the remainder of the day. This will help to prevent light-headedness or fainting.   If you feel light-headed, lie down until the feeling goes away.  Eating and Drinking   Be sure to eat well-balanced meals for the next 24 hours.   Drink enough fluid to keep your urine clear or pale yellow.   Avoid drinking alcohol on the day that you had the procedure.  Care of the Needle Insertion Site   Keep your bandage dry. You can remove the bandage after about 5 hours or as directed by your health care provider.   If you have bleeding from the needle insertion site, elevate your arm and press firmly on the site until the bleeding stops.   If you have bruising at the site, apply ice to the area:   Put ice in a plastic bag.   Place a towel between your skin and the bag.   Leave the ice on for 20 minutes, 2-3 times a day for the first 24 hours.   If the swelling does not go away after 24 hours, apply  a warm, moist washcloth to the area for 20 minutes, 2-3 times a day.  General Instructions   Avoid smoking for at least 30 minutes after the procedure.   Keep all follow-up visits as directed by your health care provider. It is important to continue with further therapeutic phlebotomy treatments as directed.  SEEK MEDICAL CARE IF:   You have redness, swelling, or pain at the needle insertion site.   You have fluid, blood, or pus coming from the needle insertion site.   You feel light-headed, dizzy, or nauseated, and the feeling does not go away.   You notice new bruising at the needle insertion site.   You feel weaker than normal.   You have a fever or chills.  SEEK IMMEDIATE MEDICAL CARE IF:   You have severe nausea or vomiting.   You have chest pain.   You have trouble breathing.    This information is not intended to replace advice given to you by your health care provider. Make sure you discuss any questions you have with your health care provider.    Document Released: 06/16/2010 Document Revised: 05/29/2014 Document Reviewed: 01/08/2014  Elsevier Interactive Patient Education 2016 [employer]

## 2014-12-12 NOTE — Progress Notes (Signed)
[name] presents today for phlebotomy per MD orders. Phlebotomy procedure started at 12:50 and ended at 13:15. 500 mls removed. Patient observed for 30 minutes after procedure without any incident. Patient tolerated procedure well. IV needle removed intact.

## 2015-01-30 ENCOUNTER — Ambulatory Visit (HOSPITAL_BASED_OUTPATIENT_CLINIC_OR_DEPARTMENT_OTHER): Payer: Medicare Other

## 2015-01-30 ENCOUNTER — Other Ambulatory Visit (HOSPITAL_BASED_OUTPATIENT_CLINIC_OR_DEPARTMENT_OTHER): Payer: Medicare Other

## 2015-01-30 ENCOUNTER — Ambulatory Visit (HOSPITAL_BASED_OUTPATIENT_CLINIC_OR_DEPARTMENT_OTHER): Payer: Medicare Other | Admitting: Hematology & Oncology

## 2015-01-30 ENCOUNTER — Encounter: Payer: Self-pay | Admitting: Hematology & Oncology

## 2015-01-30 VITALS — BP 118/55 | HR 88 | Temp 98.0°F | Resp 16

## 2015-01-30 VITALS — BP 133/67 | HR 90 | Temp 97.7°F | Resp 16 | Ht 65.0 in | Wt 143.0 lb

## 2015-01-30 DIAGNOSIS — E039 Hypothyroidism, unspecified: Secondary | ICD-10-CM

## 2015-01-30 DIAGNOSIS — M810 Age-related osteoporosis without current pathological fracture: Secondary | ICD-10-CM | POA: Diagnosis not present

## 2015-01-30 DIAGNOSIS — D45 Polycythemia vera: Secondary | ICD-10-CM

## 2015-01-30 DIAGNOSIS — D510 Vitamin B12 deficiency anemia due to intrinsic factor deficiency: Secondary | ICD-10-CM | POA: Diagnosis not present

## 2015-01-30 DIAGNOSIS — D509 Iron deficiency anemia, unspecified: Secondary | ICD-10-CM

## 2015-01-30 DIAGNOSIS — G729 Myopathy, unspecified: Secondary | ICD-10-CM | POA: Diagnosis not present

## 2015-01-30 DIAGNOSIS — M80041S Age-related osteoporosis with current pathological fracture, right hand, sequela: Secondary | ICD-10-CM | POA: Diagnosis not present

## 2015-01-30 LAB — COMPREHENSIVE METABOLIC PANEL
ALT: 49 U/L (ref 0–55)
AST: 64 U/L — ABNORMAL HIGH (ref 5–34)
Albumin: 3.4 g/dL — ABNORMAL LOW (ref 3.5–5.0)
Alkaline Phosphatase: 87 U/L (ref 40–150)
Anion Gap: 10 mEq/L (ref 3–11)
BUN: 11.6 mg/dL (ref 7.0–26.0)
CO2: 21 mEq/L — ABNORMAL LOW (ref 22–29)
Calcium: 8.6 mg/dL (ref 8.4–10.4)
Chloride: 106 mEq/L (ref 98–109)
Creatinine: 0.7 mg/dL (ref 0.6–1.1)
EGFR: 87 mL/min/{1.73_m2} — ABNORMAL LOW (ref >90)
Glucose: 112 mg/dl (ref 70–140)
Potassium: 3.9 mEq/L (ref 3.5–5.1)
Sodium: 137 mEq/L (ref 136–145)
Total Bilirubin: 0.76 mg/dL (ref 0.20–1.20)
Total Protein: 7.1 g/dL (ref 6.4–8.3)

## 2015-01-30 LAB — CBC WITH DIFFERENTIAL (CANCER CENTER ONLY)
BASO#: 0 10*3/uL (ref 0.0–0.2)
BASO%: 0.4 % (ref 0.0–2.0)
EOS%: 1.3 % (ref 0.0–7.0)
Eosinophils Absolute: 0.1 10*3/uL (ref 0.0–0.5)
HCT: 42.8 % (ref 34.8–46.6)
HGB: 14.1 g/dL (ref 11.6–15.9)
LYMPH#: 0.8 10*3/uL — ABNORMAL LOW (ref 0.9–3.3)
LYMPH%: 9.4 % — ABNORMAL LOW (ref 14.0–48.0)
MCH: 30.5 pg (ref 26.0–34.0)
MCHC: 32.9 g/dL (ref 32.0–36.0)
MCV: 93 fL (ref 81–101)
MONO#: 0.9 10*3/uL (ref 0.1–0.9)
MONO%: 11 % (ref 0.0–13.0)
NEUT#: 6.5 10*3/uL (ref 1.5–6.5)
NEUT%: 77.9 % (ref 39.6–80.0)
Platelets: 190 10*3/uL (ref 145–400)
RBC: 4.62 10*6/uL (ref 3.70–5.32)
RDW: 14.6 % (ref 11.1–15.7)
WBC: 8.3 10*3/uL (ref 3.9–10.0)

## 2015-01-30 LAB — IRON AND TIBC
%SAT: 20 % — ABNORMAL LOW (ref 21–57)
Iron: 70 ug/dL (ref 41–142)
TIBC: 349 ug/dL (ref 236–444)
UIBC: 279 ug/dL (ref 120–384)

## 2015-01-30 LAB — FERRITIN: Ferritin: 41 ng/ml (ref 9–269)

## 2015-01-30 LAB — TSH: TSH: 2.612 m(IU)/L (ref 0.308–3.960)

## 2015-01-30 LAB — TECHNOLOGIST REVIEW CHCC SATELLITE

## 2015-01-30 MED ORDER — SODIUM CHLORIDE 0.9 % IV SOLN
500.0000 mL | INTRAVENOUS | Status: DC
  Administered 2015-01-30: 500 mL via INTRAVENOUS

## 2015-01-30 NOTE — Progress Notes (Signed)
[name] presents today for phlebotomy per MD orders. Phlebotomy procedure started at 1245 and ended at 1300. 500 ml  removed. Patient observed for 30 minutes after procedure without any incident. Patient tolerated procedure well. IV needle removed intact.

## 2015-01-30 NOTE — Patient Instructions (Signed)
Therapeutic Phlebotomy, Care After  Refer to this sheet in the next few weeks. These instructions provide you with information about caring for yourself after your procedure. Your health care provider may also give you more specific instructions. Your treatment has been planned according to current medical practices, but problems sometimes occur. Call your health care provider if you have any problems or questions after your procedure.  WHAT TO EXPECT AFTER THE PROCEDURE  After your procedure, it is common to have:   Light-headedness or dizziness. You may feel faint.   Nausea.   Tiredness.  HOME CARE INSTRUCTIONS  Activities   Return to your normal activities as directed by your health care provider. Most people can go back to their normal activities right away.   Avoid strenuous physical activity and heavy lifting or pulling for about 5 hours after the procedure. Do not lift anything that is heavier than 10 lb (4.5 kg).   Athletes should avoid strenuous exercise for at least 12 hours.   Change positions slowly for the remainder of the day. This will help to prevent light-headedness or fainting.   If you feel light-headed, lie down until the feeling goes away.  Eating and Drinking   Be sure to eat well-balanced meals for the next 24 hours.   Drink enough fluid to keep your urine clear or pale yellow.   Avoid drinking alcohol on the day that you had the procedure.  Care of the Needle Insertion Site   Keep your bandage dry. You can remove the bandage after about 5 hours or as directed by your health care provider.   If you have bleeding from the needle insertion site, elevate your arm and press firmly on the site until the bleeding stops.   If you have bruising at the site, apply ice to the area:   Put ice in a plastic bag.   Place a towel between your skin and the bag.   Leave the ice on for 20 minutes, 2-3 times a day for the first 24 hours.   If the swelling does not go away after 24 hours, apply  a warm, moist washcloth to the area for 20 minutes, 2-3 times a day.  General Instructions   Avoid smoking for at least 30 minutes after the procedure.   Keep all follow-up visits as directed by your health care provider. It is important to continue with further therapeutic phlebotomy treatments as directed.  SEEK MEDICAL CARE IF:   You have redness, swelling, or pain at the needle insertion site.   You have fluid, blood, or pus coming from the needle insertion site.   You feel light-headed, dizzy, or nauseated, and the feeling does not go away.   You notice new bruising at the needle insertion site.   You feel weaker than normal.   You have a fever or chills.  SEEK IMMEDIATE MEDICAL CARE IF:   You have severe nausea or vomiting.   You have chest pain.   You have trouble breathing.    This information is not intended to replace advice given to you by your health care provider. Make sure you discuss any questions you have with your health care provider.    Document Released: 06/16/2010 Document Revised: 05/29/2014 Document Reviewed: 01/08/2014  Elsevier Interactive Patient Education 2016 [employer]

## 2015-01-31 LAB — VITAMIN D 25 HYDROXY (VIT D DEFICIENCY, FRACTURES): Vit D, 25-Hydroxy: 42 ng/mL (ref 30–100)

## 2015-01-31 LAB — VITAMIN B12: Vitamin B-12: 420 pg/mL (ref 211–911)

## 2015-02-01 NOTE — Progress Notes (Signed)
Hematology and Oncology Follow Up Visit  [name] [id number] 1945/11/07 70 y.o. 02/01/2015   Principle Diagnosis:  1. 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 2. Hemochromatosis (H63D heterozygote mutation). 3. Asthma. 4. Mitochondrial myopathy.  Current Therapy:   1. Phlebotomy to maintain hematocrit below 42%. 2. Aspirin 81 mg p.o. daily.     Interim History:  [name] is coming for followup.  She doesn't feel as good today. Apparently, her mitochondrial myopathy is getting worse. She says that the doctors at [facility] do not want to see her anymore as they say nothing they can offer for her to help her.  She just gets tired quite a bit.  Her asthma has been doing okay. She is using inhalers for this.   She's noticed some easy bruising. I think this is probably from her medications. l  She's had no fever. There's been no change in bowel or bladder habits. She's had no leg swelling.  Overall, her performance status is ECOG 1-2..  Medications:  Current outpatient prescriptions:  .  albuterol (PROAIR HFA) 108 (90 BASE) MCG/ACT inhaler, Inhale 2 puffs into the lungs every 4 (four) hours as needed for wheezing or shortness of breath. (Patient taking differently: Inhale 1-2 puffs into the lungs every 4 (four) hours as needed for wheezing or shortness of breath. ), Disp: 1 Inhaler, Rfl: 3 .  aspirin 81 MG tablet, Take 81 mg by mouth daily., Disp: , Rfl:  .  beclomethasone (QVAR) 80 MCG/ACT inhaler, INHALE 2 PUFFS INTO THE LUNGS TWICE DAILY., Disp: 8.7 g, Rfl: 5 .  Cyanocobalamin (B-12) 1000 MCG/ML KIT, Inject 1 applicator as directed every 30 (thirty) days. Last given 9/7, Disp: , Rfl:  .  Estradiol-Norethindrone Acet (LOPREEZA) 0.5-0.1 MG per tablet, Take 1 tablet by mouth daily., Disp: , Rfl:  .  hydrocortisone valerate cream (WESTCORT) 0.2 %, Apply 1 application topically 2 (two) times daily as needed (itching). , Disp: , Rfl:  .  LevOCARNitine L-Tartrate (L-CARNITINE) 500 MG CAPS,  Take 2 capsules (1,000 mg total) by mouth daily., Disp: 60 capsule, Rfl: 4 .  LORazepam (ATIVAN) 0.5 MG tablet, TAKE 1 TABLET BY MOUTH EVERY NIGHT AT BEDTIME, Disp: 30 tablet, Rfl: 0 .  Melatonin 3 MG TABS, Take 3 mg by mouth at bedtime. , Disp: , Rfl:  .  sertraline (ZOLOFT) 50 MG tablet, Take 50 mg by mouth every morning., Disp: , Rfl: 2 .  SPIRIVA RESPIMAT 2.5 MCG/ACT AERS, 1 puff once day, Disp: , Rfl: 5 .  Vitamin D, Ergocalciferol, (DRISDOL) 50000 UNITS CAPS capsule, Take 50,000 Units by mouth every Sunday. , Disp: , Rfl:  .  Wheat Dextrin (CLEAR SOLUBLE FIBER) POWD, Take 5 mLs by mouth every morning. , Disp: , Rfl:   Allergies: No Known Allergies  Past Medical History, Surgical history, Social history, and Family History were reviewed and updated.  Review of Systems: As above  Physical Exam:  height is 5' 5"  (1.651 m) and weight is 143 lb (64.864 kg). Her oral temperature is 97.7 F (36.5 C). Her blood pressure is 133/67 and her pulse is 90. Her respiration is 16.   Fairly well developed and well-nourished white female. Head and neck exam shows no ocular or oral lesions. There are no palpable cervical or supraclavicular lymph nodes. Lungs are clear. Cardiac exam regular rate and rhythm with no murmurs, rubs or bruits. Abdomen is soft. She has good bowel sounds. There is no fluid wave. There is no palpable liver or spleen tip.  Extremities shows no clubbing, cyanosis or edema. She has decent muscle tone. She has some muscle atrophy bilaterally. Neurological exam is non-focal. Skin exam no rashes.  Lab Results  Component Value Date   WBC 8.3 01/30/2015   HGB 14.1 01/30/2015   HCT 42.8 01/30/2015   MCV 93 01/30/2015   PLT 190 01/30/2015     Chemistry      Component Value Date/Time   NA 137 01/30/2015 1110   NA 135 09/11/2014 1146   NA 137 04/03/2014 1033   K 3.9 01/30/2015 1110   K 4.0 09/11/2014 1146   K 4.1 04/03/2014 1033   CL 103 09/11/2014 1146   CL 99 04/03/2014 1033     CO2 21* 01/30/2015 1110   CO2 22 09/11/2014 1146   CO2 27 04/03/2014 1033   BUN 11.6 01/30/2015 1110   BUN 11 09/11/2014 1146   BUN 10 04/03/2014 1033   CREATININE 0.7 01/30/2015 1110   CREATININE 0.57* 09/11/2014 1146   CREATININE 0.7 04/03/2014 1033      Component Value Date/Time   CALCIUM 8.6 01/30/2015 1110   CALCIUM 8.9 09/11/2014 1146   CALCIUM 8.9 04/03/2014 1033   ALKPHOS 87 01/30/2015 1110   ALKPHOS 62 09/11/2014 1146   ALKPHOS 61 04/03/2014 1033   AST 64* 01/30/2015 1110   AST 30 09/11/2014 1146   AST 36 04/03/2014 1033   ALT 49 01/30/2015 1110   ALT 30* 09/11/2014 1146   ALT 28 04/03/2014 1033   BILITOT 0.76 01/30/2015 1110   BILITOT 0.6 09/11/2014 1146   BILITOT 1.00 04/03/2014 1033         Impression and Plan: [name] is 70 year old female who. She has polycythemia. She has hemachromatosis. She has a mitochondrial myopathy.  We will go ahead and phlebotomize her now. Again I want to keep her hematocrit below 42%.  I just wish that there is something to be done for this mitochondrial myopathy.  Her thyroid is ok.  Her iron level is low which we want.  We will go ahead and plan to get her back in about 6 weeks. Hopefully we can get her through the holidays.    [name], MD 1/6/20177:27 AM

## 2015-02-06 DIAGNOSIS — H61002 Unspecified perichondritis of left external ear: Secondary | ICD-10-CM | POA: Diagnosis not present

## 2015-02-21 ENCOUNTER — Encounter: Payer: Self-pay | Admitting: Emergency Medicine

## 2015-02-21 ENCOUNTER — Ambulatory Visit (INDEPENDENT_AMBULATORY_CARE_PROVIDER_SITE_OTHER): Payer: Medicare Other | Admitting: Emergency Medicine

## 2015-02-21 VITALS — BP 106/64 | HR 101 | Ht 63.0 in | Wt 145.0 lb

## 2015-02-21 DIAGNOSIS — R05 Cough: Secondary | ICD-10-CM

## 2015-02-21 DIAGNOSIS — J45909 Unspecified asthma, uncomplicated: Secondary | ICD-10-CM

## 2015-02-21 DIAGNOSIS — J449 Chronic obstructive pulmonary disease, unspecified: Secondary | ICD-10-CM | POA: Diagnosis not present

## 2015-02-21 DIAGNOSIS — R059 Cough, unspecified: Secondary | ICD-10-CM | POA: Insufficient documentation

## 2015-02-21 NOTE — Assessment & Plan Note (Signed)
She is having side effects from the Spiriva especially urinary retention. She is also having cough that may be related to Qvar. I would like to stop both medications to see how she tolerates. I suspect that she will need to restart a bronchodilator of some type. Note she has had trouble with LABAs in the past due to jitteriness.

## 2015-02-21 NOTE — Progress Notes (Signed)
Subjective:    Patient ID: [name], female    DOB: December 16, 1945, 70 y.o.   MRN: [id number]  HPI OV 03/20/14 -- Patient comes in today for follow-up of her known chronic obstructive asthma. He has been maintained on Qvar, and at the last visit we tried Spiriva Respimat. Because of expense, she has been using half dose, and most recently has been having issues with increasing shortness of breath. She has been using her rescue inhaler excessively, but is unsure if this helps. She has not wanted to use LABA's because of side effects. He should also be kept in mind that she has some type of neuromuscular disease which appears to be progressing, wraps this is the cause of her dyspnea. She is scheduled to be evaluated at [facility] in the near future.  ROV 08/21/14 -- follow-up visit for patient who has been managed by Dr. Gwenette Greet for severe chronic obstructive disease with an asthmatic component based on a positive bronchodilator response on temporary function testing from 01/23/13 that I have personally reviewed. On that study she also had restricted lung volumes and a decreased diffusion capacity. 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 She was seen at [facility] re: the myopathy, was defined as mild-to-mod on EMG. Is planning to do some PT.  She is using Spiriva respimat 1 puff qd + QVAR bid. Rarely uses albuterol. She describes significant exertional dyspnea, especially with staircase, lifting.  She denies any cough, no mucous production. She underwent a walking oximetry w Dr [name], did not desaturate.   ROV 02/21/15 -- follow-up for severe COPD with an asthmatic component and a positive bronchodilator response. Also some coinciding restrictive lung disease that may be associated with a mitochondrial myopathy. She has been managed on Spiriva, Qvar, pro-air when necessary. She returns today c/o cough that is new compared with last visit. She has a lot of throat  clearing, seems to be exacerbated by Qvar. She is on Spiriva > causes urinary retention. She has some rhinitis, she is having a lot of reflux since last time.   Review of Systems  Constitutional: Negative for fever and unexpected weight change.  HENT: Positive for congestion, postnasal drip and sinus pressure. Negative for dental problem, ear pain, nosebleeds, rhinorrhea, sneezing, sore throat and trouble swallowing.   Eyes: Negative for redness and itching.  Respiratory: Positive for cough, chest tightness and shortness of breath. Negative for wheezing.   Cardiovascular: Negative for palpitations and leg swelling.  Gastrointestinal: Negative for nausea and vomiting.  Genitourinary: Negative for dysuria.  Musculoskeletal: Negative for joint swelling.  Skin: Negative for rash.  Neurological: Negative for headaches.  Hematological: Does not bruise/bleed easily.  Psychiatric/Behavioral: Negative for dysphoric mood. The patient is not nervous/anxious.    Past Medical History  Diagnosis Date  . Pericarditis     age 69  . Pneumothorax   . Anxiety   . Iliotibial band syndrome     left knee  . Vitamin D deficiency   . Migraine   . Asthma   . Anemia   . IBS (irritable bowel syndrome)   . Hemorrhoids   . Cholelithiasis   . Mitochondrial myopathy   . Heart attack ([place])   . Osteoporosis   . Allergy   . Blood transfusion without reported diagnosis   . 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 PVC's (premature ventricular contractions)   . Normal coronary arteries     by cardiac catheterization performed by myself 02/25/01  .  Chronic kidney disease     gallstones      Family History  Problem Relation Age of Onset  . Asthma Mother   . Arthritis Mother   . Depression Mother   . Cancer Father   . Emphysema Father   . Stroke Father   . Alcohol abuse Father   . Heart attack Father   . Colon cancer Neg Hx      Social History   Social History  . Marital Status: Married    Spouse  Name: Richard  . Number of Children: 2  . Years of Education: Grad   Occupational History  . Psychologist, educational    Social History Main Topics  . Smoking status: Never Smoker   . Smokeless tobacco: Never Used     Comment: never used tobacco  . Alcohol Use: 1.2 oz/week    2 Glasses of wine per week     Comment: occasionally  . Drug Use: No  . Sexual Activity: Not on file   Other Topics Concern  . Not on file   Social History Narrative   Health Care POA:    Emergency Contact: husband, Francene Finders, (c) [phone]   End of Life Plan:    Who lives with you: husband   Any pets: none   Diet: Pt has a varied diet.  Eats 5 sm. meals throughout day, focuses on protein, doesn't care for fruits and vegetables very much.   Exercise: Pt has a personal training and exercises several times a week.   Seatbelts: Pt reports wearing seatbelt when in vehicle.   Nancy Fetter Exposure/Protection: Pt reports wearing sun protection.    Hobbies: reading, visiting with friends   Patient has a Scientist, water quality.   Patient has two children.   Patient is retired.   Patient does not drink any caffeine.   Patient is right handed.              No Known Allergies   Outpatient Prescriptions Prior to Visit  Medication Sig Dispense Refill  . albuterol (PROAIR HFA) 108 (90 BASE) MCG/ACT inhaler Inhale 2 puffs into the lungs every 4 (four) hours as needed for wheezing or shortness of breath. (Patient taking differently: Inhale 1-2 puffs into the lungs every 4 (four) hours as needed for wheezing or shortness of breath. ) 1 Inhaler 3  . aspirin 81 MG tablet Take 81 mg by mouth daily.    . beclomethasone (QVAR) 80 MCG/ACT inhaler INHALE 2 PUFFS INTO THE LUNGS TWICE DAILY. 8.7 g 5  . Cyanocobalamin (B-12) 1000 MCG/ML KIT Inject 1 applicator as directed every 30 (thirty) days. Last given 9/7    . Estradiol-Norethindrone Acet (LOPREEZA) 0.5-0.1 MG per tablet Take 1 tablet by mouth daily.    . hydrocortisone valerate cream  (WESTCORT) 0.2 % Apply 1 application topically 2 (two) times daily as needed (itching).     . LevOCARNitine L-Tartrate (L-CARNITINE) 500 MG CAPS Take 2 capsules (1,000 mg total) by mouth daily. 60 capsule 4  . LORazepam (ATIVAN) 0.5 MG tablet TAKE 1 TABLET BY MOUTH EVERY NIGHT AT BEDTIME 30 tablet 0  . Melatonin 3 MG TABS Take 3 mg by mouth at bedtime.     . sertraline (ZOLOFT) 50 MG tablet Take 50 mg by mouth every morning.  2  . SPIRIVA RESPIMAT 2.5 MCG/ACT AERS 1 puff once day  5  . Vitamin D, Ergocalciferol, (DRISDOL) 50000 UNITS CAPS capsule Take 50,000 Units by mouth every Sunday.     Marland Kitchen  Wheat Dextrin (CLEAR SOLUBLE FIBER) POWD Take 5 mLs by mouth every morning.      No facility-administered medications prior to visit.         Objective:   Physical Exam Filed Vitals:   02/21/15 1611 02/21/15 1612  BP:  106/64  Pulse:  101  Height: _0  (1.6 m)   Weight: 145 lb (65.772 kg)   SpO2:  97%   Gen: Pleasant, well-nourished, in no distress,  normal affect  ENT: No lesions,  mouth clear,  oropharynx clear, no postnasal drip  Neck: No JVD, no TMG, no carotid bruits  Lungs: No use of accessory muscles, clear without rales or rhonchi  Cardiovascular: RRR, heart sounds normal, no murmur or gallops, no peripheral edema  Musculoskeletal: No deformities, no cyanosis or clubbing  Neuro: alert, non focal  Skin: Warm, no lesions or rashes       Assessment & Plan:  Chronic obstructive airway disease with asthma She is having side effects from the Spiriva especially urinary retention. She is also having cough that may be related to Qvar. I would like to stop both medications to see how she tolerates. I suspect that she will need to restart a bronchodilator of some type. Note she has had trouble with LABAs in the past due to jitteriness.   Cough Suspect she may have GERD based on her report that is contributing to cough. Also must consider a contribution of her Qvar. I will stop the  Qvar for now. She does not want for me to start a PPI at this time. She will prefer to wait and see how she responds off the inhaled steroid. If she continues to cough 3-4 weeks after stopping the inhaler then I believe we should start an empiric PPI. I have asked her to call and if she still coughing we will start omeprazole 20 mg twice a day

## 2015-02-21 NOTE — Assessment & Plan Note (Signed)
Suspect she may have GERD based on her report that is contributing to cough. Also must consider a contribution of her Qvar. I will stop the Qvar for now. She does not want for me to start a PPI at this time. She will prefer to wait and see how she responds off the inhaled steroid. If she continues to cough 3-4 weeks after stopping the inhaler then I believe we should start an empiric PPI. I have asked her to call and if she still coughing we will start omeprazole 20 mg twice a day

## 2015-02-21 NOTE — Patient Instructions (Addendum)
Please stop your QVAR and Spiriva for now.  We will consider starting an alternative inhaled medication in the future depending on your symptoms  If your cough continues after you are off the QVAR 3-4 weeks then we will start an acid reflux medication.  Follow with Dr Lamonte Sakai in 2 months or sooner if you have any problems.

## 2015-03-13 ENCOUNTER — Other Ambulatory Visit (HOSPITAL_BASED_OUTPATIENT_CLINIC_OR_DEPARTMENT_OTHER): Payer: Medicare Other

## 2015-03-13 ENCOUNTER — Ambulatory Visit (HOSPITAL_BASED_OUTPATIENT_CLINIC_OR_DEPARTMENT_OTHER): Payer: Medicare Other | Admitting: Hematology & Oncology

## 2015-03-13 ENCOUNTER — Encounter: Payer: Self-pay | Admitting: Hematology & Oncology

## 2015-03-13 ENCOUNTER — Ambulatory Visit: Payer: Medicare Other

## 2015-03-13 VITALS — BP 129/57 | HR 91 | Temp 97.8°F | Resp 18 | Ht 63.0 in | Wt 142.0 lb

## 2015-03-13 DIAGNOSIS — E039 Hypothyroidism, unspecified: Secondary | ICD-10-CM | POA: Diagnosis not present

## 2015-03-13 DIAGNOSIS — D45 Polycythemia vera: Secondary | ICD-10-CM

## 2015-03-13 DIAGNOSIS — G713 Mitochondrial myopathy, not elsewhere classified: Secondary | ICD-10-CM

## 2015-03-13 DIAGNOSIS — R251 Tremor, unspecified: Secondary | ICD-10-CM

## 2015-03-13 DIAGNOSIS — G729 Myopathy, unspecified: Secondary | ICD-10-CM | POA: Diagnosis not present

## 2015-03-13 DIAGNOSIS — D509 Iron deficiency anemia, unspecified: Secondary | ICD-10-CM | POA: Diagnosis not present

## 2015-03-13 LAB — COMPREHENSIVE METABOLIC PANEL
ALT: 42 U/L (ref 0–55)
AST: 51 U/L — ABNORMAL HIGH (ref 5–34)
Albumin: 3.4 g/dL — ABNORMAL LOW (ref 3.5–5.0)
Alkaline Phosphatase: 82 U/L (ref 40–150)
Anion Gap: 10 mEq/L (ref 3–11)
BUN: 11.8 mg/dL (ref 7.0–26.0)
CO2: 21 mEq/L — ABNORMAL LOW (ref 22–29)
Calcium: 9.1 mg/dL (ref 8.4–10.4)
Chloride: 107 mEq/L (ref 98–109)
Creatinine: 0.7 mg/dL (ref 0.6–1.1)
EGFR: 87 mL/min/{1.73_m2} — ABNORMAL LOW (ref >90)
Glucose: 119 mg/dl (ref 70–140)
Potassium: 4.1 mEq/L (ref 3.5–5.1)
Sodium: 138 mEq/L (ref 136–145)
Total Bilirubin: 0.75 mg/dL (ref 0.20–1.20)
Total Protein: 7.1 g/dL (ref 6.4–8.3)

## 2015-03-13 LAB — CBC WITH DIFFERENTIAL (CANCER CENTER ONLY)
BASO#: 0 10*3/uL (ref 0.0–0.2)
BASO%: 0.3 % (ref 0.0–2.0)
EOS%: 1.8 % (ref 0.0–7.0)
Eosinophils Absolute: 0.2 10*3/uL (ref 0.0–0.5)
HCT: 40.6 % (ref 34.8–46.6)
HGB: 13.2 g/dL (ref 11.6–15.9)
LYMPH#: 1 10*3/uL (ref 0.9–3.3)
LYMPH%: 10.8 % — ABNORMAL LOW (ref 14.0–48.0)
MCH: 29.3 pg (ref 26.0–34.0)
MCHC: 32.5 g/dL (ref 32.0–36.0)
MCV: 90 fL (ref 81–101)
MONO#: 0.8 10*3/uL (ref 0.1–0.9)
MONO%: 9.1 % (ref 0.0–13.0)
NEUT#: 7.1 10*3/uL — ABNORMAL HIGH (ref 1.5–6.5)
NEUT%: 78 % (ref 39.6–80.0)
Platelets: 227 10*3/uL (ref 145–400)
RBC: 4.5 10*6/uL (ref 3.70–5.32)
RDW: 14.3 % (ref 11.1–15.7)
WBC: 9.1 10*3/uL (ref 3.9–10.0)

## 2015-03-13 NOTE — Progress Notes (Signed)
Hematology and Oncology Follow Up Visit  [name] [id number] 10/24/1945 70 y.o. 03/13/2015   Principle Diagnosis:  1. 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 2. Hemochromatosis (H63D heterozygote mutation). 3. Asthma. 4. Mitochondrial myopathy.  Current Therapy:   1. Phlebotomy to maintain hematocrit below 42%. 2. Aspirin 81 mg p.o. daily.     Interim History:  [name] is coming for followup.  She actually looks pretty good today. She is off her asthma medications. She says her breathing has been doing pretty well area  She is very worried about asking about getting her phlebotomies at the main [place]. Somehow, this will work better for her. She still wants to see [place]. However, if she needs Faith phlebotomy, it would be much more to be referred to have it closer to home.  She's had no problems with pain.  Her appetite has been okay. She enjoyed the holidays.  [name] is moving to [facility].  She still has issues with tremors.  She's had no fever. She's had no rashes. She's had no leg swelling.   We last saw her in early January, her ferritin was 41 with iron saturation of 20%.  Overall, her performance status is ECOG 1-2..  Medications:  Current outpatient prescriptions:  .  aspirin 81 MG tablet, Take 81 mg by mouth daily., Disp: , Rfl:  .  Cyanocobalamin (B-12) 1000 MCG/ML KIT, Inject 1 applicator as directed every 30 (thirty) days. Last given 9/7, Disp: , Rfl:  .  Estradiol-Norethindrone Acet (LOPREEZA) 0.5-0.1 MG per tablet, Take 1 tablet by mouth daily., Disp: , Rfl:  .  hydrocortisone valerate cream (WESTCORT) 0.2 %, Apply 1 application topically 2 (two) times daily as needed (itching). , Disp: , Rfl:  .  LevOCARNitine L-Tartrate (L-CARNITINE) 500 MG CAPS, Take 2 capsules (1,000 mg total) by mouth daily., Disp: 60 capsule, Rfl: 4 .  LORazepam (ATIVAN) 0.5 MG tablet, TAKE 1 TABLET BY MOUTH EVERY NIGHT AT BEDTIME, Disp: 30 tablet, Rfl: 0 .   Melatonin 3 MG TABS, Take 3 mg by mouth at bedtime. , Disp: , Rfl:  .  sertraline (ZOLOFT) 50 MG tablet, Take 50 mg by mouth every morning., Disp: , Rfl: 2 .  Vitamin D, Ergocalciferol, (DRISDOL) 50000 UNITS CAPS capsule, Take 50,000 Units by mouth every Sunday. , Disp: , Rfl:  .  Wheat Dextrin (CLEAR SOLUBLE FIBER) POWD, Take 5 mLs by mouth every morning. , Disp: , Rfl:   Allergies: No Known Allergies  Past Medical History, Surgical history, Social history, and Family History were reviewed and updated.  Review of Systems: As above  Physical Exam:  height is 5' 3"  (1.6 m) and weight is 142 lb (64.411 kg). Her oral temperature is 97.8 F (36.6 C). Her blood pressure is 129/57 and her pulse is 91. Her respiration is 18.   Fairly well developed and well-nourished white female. Head and neck exam shows no ocular or oral lesions. There are no palpable cervical or supraclavicular lymph nodes. Lungs are clear. Cardiac exam regular rate and rhythm with no murmurs, rubs or bruits. Abdomen is soft. She has good bowel sounds. There is no fluid wave. There is no palpable liver or spleen tip. Extremities shows no clubbing, cyanosis or edema. She has decent muscle tone. She has some muscle atrophy bilaterally. Neurological exam is non-focal. Skin exam no rashes.  Lab Results  Component Value Date   WBC 9.1 03/13/2015   HGB 13.2 03/13/2015   HCT 40.6 03/13/2015  MCV 90 03/13/2015   PLT 227 03/13/2015     Chemistry      Component Value Date/Time   NA 138 03/13/2015 1145   NA 135 09/11/2014 1146   NA 137 04/03/2014 1033   K 4.1 03/13/2015 1145   K 4.0 09/11/2014 1146   K 4.1 04/03/2014 1033   CL 103 09/11/2014 1146   CL 99 04/03/2014 1033   CO2 21* 03/13/2015 1145   CO2 22 09/11/2014 1146   CO2 27 04/03/2014 1033   BUN 11.8 03/13/2015 1145   BUN 11 09/11/2014 1146   BUN 10 04/03/2014 1033   CREATININE 0.7 03/13/2015 1145   CREATININE 0.57* 09/11/2014 1146   CREATININE 0.7 04/03/2014 1033       Component Value Date/Time   CALCIUM 9.1 03/13/2015 1145   CALCIUM 8.9 09/11/2014 1146   CALCIUM 8.9 04/03/2014 1033   ALKPHOS 82 03/13/2015 1145   ALKPHOS 62 09/11/2014 1146   ALKPHOS 61 04/03/2014 1033   AST 51* 03/13/2015 1145   AST 30 09/11/2014 1146   AST 36 04/03/2014 1033   ALT 42 03/13/2015 1145   ALT 30* 09/11/2014 1146   ALT 28 04/03/2014 1033   BILITOT 0.75 03/13/2015 1145   BILITOT 0.6 09/11/2014 1146   BILITOT 1.00 04/03/2014 1033         Impression and Plan: [name] is 70 year old female who. She has polycythemia. She has hemachromatosis. She has Faith mitochondrial myopathy.  She does not need to be phlebotomized. We did talk about getting her phlebotomies over at [facility]. This is more convenient for her.   For now, we will see her back in 6 weeks. If she needs to be phlebotomized, then we can do this at [facility].   [name], MD 2/15/20171:53 PM

## 2015-03-13 NOTE — Progress Notes (Signed)
No phlebotomy today per dr. ennever 

## 2015-03-14 LAB — TSH: TSH: 2.18 m(IU)/L (ref 0.308–3.960)

## 2015-03-14 LAB — IRON AND TIBC
%SAT: 11 % — ABNORMAL LOW (ref 21–57)
Iron: 38 ug/dL — ABNORMAL LOW (ref 41–142)
TIBC: 344 ug/dL (ref 236–444)
UIBC: 306 ug/dL (ref 120–384)

## 2015-03-14 LAB — FERRITIN: Ferritin: 25 ng/ml (ref 9–269)

## 2015-03-14 LAB — RETICULOCYTES: Reticulocyte Count: 1.8 % (ref 0.6–2.6)

## 2015-03-15 ENCOUNTER — Telehealth: Payer: Self-pay | Admitting: Emergency Medicine

## 2015-03-15 ENCOUNTER — Telehealth: Payer: Self-pay | Admitting: *Deleted

## 2015-03-15 NOTE — Telephone Encounter (Addendum)
Patient aware of results  ----- Message from [name], MD sent at 03/14/2015  2:04 PM EST ----- Call - thyroid is ok!!  pete

## 2015-03-15 NOTE — Telephone Encounter (Signed)
Noted  

## 2015-04-01 ENCOUNTER — Telehealth: Payer: Self-pay | Admitting: Emergency Medicine

## 2015-04-01 MED ORDER — PANTOPRAZOLE SODIUM 40 MG PO TBEC: 40.0000 mg | DELAYED_RELEASE_TABLET | Freq: Every day | ORAL | Status: DC

## 2015-04-01 NOTE — Telephone Encounter (Signed)
Please ask her to start pantoprazole 40mg  daily, take either 1 hour before one hour after eating.. Also ensure that she has albuterol to use 2 puffs as needed until we determine whether to restart her on an everyday inhaler. Insure  that she has follow-up with me

## 2015-04-01 NOTE — Telephone Encounter (Signed)
Pt aware of rec's per RB. Pantoprazole 40mg  QD called [facility] Upcoming visit with RB 04/26/15 Nothing further needed.

## 2015-04-01 NOTE — Telephone Encounter (Signed)
Stopped Spiriva and Qvar.  She did fine for a while without the medications, but now she is not doing well.  Reflux, wheezing, clearing throat constantly, feels very fatigue, dry cough.   Pharmacy:  [facility] - [address]   Allergies  Allergen Reactions  . Dulera [Mometasone Furo-Formoterol Fum] Cough

## 2015-04-01 NOTE — Telephone Encounter (Signed)
Pt cb, [phone]

## 2015-04-01 NOTE — Telephone Encounter (Signed)
Pt called back and is aware we are awaiting response. Please advise RB thanks

## 2015-04-02 ENCOUNTER — Telehealth: Payer: Self-pay | Admitting: Emergency Medicine

## 2015-04-02 NOTE — Telephone Encounter (Signed)
Yes this is OK. I think she should do both > the QVAR and the pantoprazole. Then follow with me to assess her status.

## 2015-04-02 NOTE — Telephone Encounter (Signed)
Spoke with pt, states she wants to go back on qvar.  She states this is the purpose of her call yesterday but her reflux regimen ended up being the main focus.  Pt uses [facility] on [address].   RB please advise if you're ok with pt going back on qvar.  Thanks.

## 2015-04-02 NOTE — Telephone Encounter (Signed)
Called and spoke with pt. Reviewed recs and verified that she did not need any refills. She voiced understanding and had no further questions. Nothing further needed.

## 2015-04-23 ENCOUNTER — Other Ambulatory Visit (HOSPITAL_BASED_OUTPATIENT_CLINIC_OR_DEPARTMENT_OTHER): Payer: Medicare Other

## 2015-04-23 ENCOUNTER — Ambulatory Visit (HOSPITAL_BASED_OUTPATIENT_CLINIC_OR_DEPARTMENT_OTHER): Payer: Medicare Other | Admitting: Hematology & Oncology

## 2015-04-23 ENCOUNTER — Encounter: Payer: Self-pay | Admitting: Hematology & Oncology

## 2015-04-23 VITALS — BP 135/58 | HR 91 | Temp 97.6°F | Resp 16 | Ht 63.0 in | Wt 144.0 lb

## 2015-04-23 DIAGNOSIS — D45 Polycythemia vera: Secondary | ICD-10-CM

## 2015-04-23 DIAGNOSIS — R251 Tremor, unspecified: Secondary | ICD-10-CM | POA: Diagnosis not present

## 2015-04-23 DIAGNOSIS — D510 Vitamin B12 deficiency anemia due to intrinsic factor deficiency: Secondary | ICD-10-CM

## 2015-04-23 DIAGNOSIS — G250 Essential tremor: Secondary | ICD-10-CM

## 2015-04-23 LAB — COMPREHENSIVE METABOLIC PANEL
ALT: 37 U/L (ref 0–55)
AST: 36 U/L — ABNORMAL HIGH (ref 5–34)
Albumin: 3.4 g/dL — ABNORMAL LOW (ref 3.5–5.0)
Alkaline Phosphatase: 76 U/L (ref 40–150)
Anion Gap: 10 mEq/L (ref 3–11)
BUN: 9.6 mg/dL (ref 7.0–26.0)
CO2: 21 mEq/L — ABNORMAL LOW (ref 22–29)
Calcium: 9 mg/dL (ref 8.4–10.4)
Chloride: 106 mEq/L (ref 98–109)
Creatinine: 0.7 mg/dL (ref 0.6–1.1)
EGFR: 88 mL/min/{1.73_m2} — ABNORMAL LOW (ref >90)
Glucose: 130 mg/dl (ref 70–140)
Potassium: 3.7 mEq/L (ref 3.5–5.1)
Sodium: 137 mEq/L (ref 136–145)
Total Bilirubin: 0.93 mg/dL (ref 0.20–1.20)
Total Protein: 7 g/dL (ref 6.4–8.3)

## 2015-04-23 LAB — CBC WITH DIFFERENTIAL (CANCER CENTER ONLY)
BASO#: 0 10*3/uL (ref 0.0–0.2)
BASO%: 0.3 % (ref 0.0–2.0)
EOS%: 1.4 % (ref 0.0–7.0)
Eosinophils Absolute: 0.1 10*3/uL (ref 0.0–0.5)
HCT: 41.2 % (ref 34.8–46.6)
HGB: 13.7 g/dL (ref 11.6–15.9)
LYMPH#: 0.8 10*3/uL — ABNORMAL LOW (ref 0.9–3.3)
LYMPH%: 8.2 % — ABNORMAL LOW (ref 14.0–48.0)
MCH: 29.5 pg (ref 26.0–34.0)
MCHC: 33.3 g/dL (ref 32.0–36.0)
MCV: 89 fL (ref 81–101)
MONO#: 0.8 10*3/uL (ref 0.1–0.9)
MONO%: 8.1 % (ref 0.0–13.0)
NEUT#: 8.1 10*3/uL — ABNORMAL HIGH (ref 1.5–6.5)
NEUT%: 82 % — ABNORMAL HIGH (ref 39.6–80.0)
Platelets: 165 10*3/uL (ref 145–400)
RBC: 4.65 10*6/uL (ref 3.70–5.32)
RDW: 15.8 % — ABNORMAL HIGH (ref 11.1–15.7)
WBC: 9.9 10*3/uL (ref 3.9–10.0)

## 2015-04-23 LAB — IRON AND TIBC
%SAT: 22 % (ref 21–57)
Iron: 76 ug/dL (ref 41–142)
TIBC: 353 ug/dL (ref 236–444)
UIBC: 276 ug/dL (ref 120–384)

## 2015-04-23 LAB — FERRITIN: Ferritin: 32 ng/ml (ref 9–269)

## 2015-04-23 MED ORDER — PRIMIDONE 50 MG PO TABS: ORAL_TABLET | ORAL | Status: DC

## 2015-04-23 NOTE — Progress Notes (Signed)
Hematology and Oncology Follow Up Visit  [name] [id number] Jun 14, 1945 70 y.o. 04/23/2015   Principle Diagnosis:  1. 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 2. Hemochromatosis (H63D heterozygote mutation). 3. Asthma. 4. Mitochondrial myopathy.  Current Therapy:   1. Phlebotomy to maintain hematocrit below 42%. 2. Aspirin 81 mg p.o. daily.     Interim History:  [name] is coming for followup.  She is not feeling too well. This tremor of her recently getting worse. She was offered a primidone by her neurologist last year. She did not want to take this initially. It seems like she might be getting a little worse so I told her that it would not be about idea.  She does not have a lot of energy. She disc its fatigue easily.  We will had a phlebotomize her for several months.  We last saw her in early January, her ferritin was 25 with iron saturation of 11%.   It is possible that at some point, if she does not get better, that we need to give her IV iron.  She's had no change in bowel or bladder habits.  She's had no fever. She's had no cough. She does take some medicine for her underlying COPD.  Overall, her performance status is ECOG 1-2..  Medications:  Current outpatient prescriptions:  .  aspirin 81 MG tablet, Take 81 mg by mouth daily., Disp: , Rfl:  .  Cyanocobalamin (B-12) 1000 MCG/ML KIT, Inject 1 applicator as directed every 30 (thirty) days. Last given 9/7, Disp: , Rfl:  .  Estradiol-Norethindrone Acet (LOPREEZA) 0.5-0.1 MG per tablet, Take 1 tablet by mouth daily., Disp: , Rfl:  .  hydrocortisone valerate cream (WESTCORT) 0.2 %, Apply 1 application topically 2 (two) times daily as needed (itching). , Disp: , Rfl:  .  LevOCARNitine L-Tartrate (L-CARNITINE) 500 MG CAPS, Take 2 capsules (1,000 mg total) by mouth daily., Disp: 60 capsule, Rfl: 4 .  LORazepam (ATIVAN) 0.5 MG tablet, TAKE 1 TABLET BY MOUTH EVERY NIGHT AT BEDTIME, Disp: 30 tablet, Rfl: 0 .  Melatonin 3  MG TABS, Take 3 mg by mouth at bedtime. , Disp: , Rfl:  .  sertraline (ZOLOFT) 50 MG tablet, Take 50 mg by mouth every morning., Disp: , Rfl: 2 .  Vitamin D, Ergocalciferol, (DRISDOL) 50000 UNITS CAPS capsule, Take 50,000 Units by mouth every Sunday. , Disp: , Rfl:  .  Wheat Dextrin (CLEAR SOLUBLE FIBER) POWD, Take 5 mLs by mouth every morning. , Disp: , Rfl:  .  pantoprazole (PROTONIX) 40 MG tablet, Take 1 tablet (40 mg total) by mouth daily. (Patient not taking: Reported on 04/23/2015), Disp: 30 tablet, Rfl: 1 .  primidone (MYSOLINE) 50 MG tablet, Week 1:  1/4 pill.  Week 2: 1/2 pill at bedtime.  Week 3:  3/4 pill at bedtime Week 4:  1 pill at bed.  Week 5: 1.5 pills at bed. Week 6: 2 pills at bed, Disp: 60 tablet, Rfl: 4  Allergies:  Allergies  Allergen Reactions  . Dulera [Mometasone Furo-Formoterol Fum] Cough    Past Medical History, Surgical history, Social history, and Family History were reviewed and updated.  Review of Systems: As above  Physical Exam:  height is _0  (1.6 m) and weight is 144 lb (65.318 kg). Her oral temperature is 97.6 F (36.4 C). Her blood pressure is 135/58 and her pulse is 91. Her respiration is 16.   Fairly well developed and well-nourished white female. Head and neck exam shows no  ocular or oral lesions. There are no palpable cervical or supraclavicular lymph nodes. Lungs are clear. Cardiac exam regular rate and rhythm with no murmurs, rubs or bruits. Abdomen is soft. She has good bowel sounds. There is no fluid wave. There is no palpable liver or spleen tip. Extremities shows no clubbing, cyanosis or edema. She has decent muscle tone. She has some muscle atrophy bilaterally. Neurological exam is non-focal. Skin exam no rashes.  Lab Results  Component Value Date   WBC 9.9 04/23/2015   HGB 13.7 04/23/2015   HCT 41.2 04/23/2015   MCV 89 04/23/2015   PLT 165 04/23/2015     Chemistry      Component Value Date/Time   NA 138 03/13/2015 1145   NA 135  09/11/2014 1146   NA 137 04/03/2014 1033   K 4.1 03/13/2015 1145   K 4.0 09/11/2014 1146   K 4.1 04/03/2014 1033   CL 103 09/11/2014 1146   CL 99 04/03/2014 1033   CO2 21* 03/13/2015 1145   CO2 22 09/11/2014 1146   CO2 27 04/03/2014 1033   BUN 11.8 03/13/2015 1145   BUN 11 09/11/2014 1146   BUN 10 04/03/2014 1033   CREATININE 0.7 03/13/2015 1145   CREATININE 0.57* 09/11/2014 1146   CREATININE 0.7 04/03/2014 1033      Component Value Date/Time   CALCIUM 9.1 03/13/2015 1145   CALCIUM 8.9 09/11/2014 1146   CALCIUM 8.9 04/03/2014 1033   ALKPHOS 82 03/13/2015 1145   ALKPHOS 62 09/11/2014 1146   ALKPHOS 61 04/03/2014 1033   AST 51* 03/13/2015 1145   AST 30 09/11/2014 1146   AST 36 04/03/2014 1033   ALT 42 03/13/2015 1145   ALT 30* 09/11/2014 1146   ALT 28 04/03/2014 1033   BILITOT 0.75 03/13/2015 1145   BILITOT 0.6 09/11/2014 1146   BILITOT 1.00 04/03/2014 1033         Impression and Plan: [name] is 70 year old female who. She has polycythemia. She has hemachromatosis. She has a mitochondrial myopathy.  She does not need to be phlebotomized.   I went ahead and prescribed for her primidone. This is what her neurologist at [facility] would a prescribed to her. He offered this to her last year but she did not want taking anything. It seems like the tremor might be getting worse now.  I will titrate up her dose according to his instructions.  I will see her back in 6 weeks.  As always, spent about 30-35 minutes with her.    [name], MD 3/28/201712:42 PM

## 2015-04-26 ENCOUNTER — Encounter: Payer: Self-pay | Admitting: Emergency Medicine

## 2015-04-26 ENCOUNTER — Ambulatory Visit (INDEPENDENT_AMBULATORY_CARE_PROVIDER_SITE_OTHER): Payer: Medicare Other | Admitting: Emergency Medicine

## 2015-04-26 VITALS — BP 122/82 | HR 90 | Wt 144.0 lb

## 2015-04-26 DIAGNOSIS — J449 Chronic obstructive pulmonary disease, unspecified: Secondary | ICD-10-CM | POA: Diagnosis not present

## 2015-04-26 DIAGNOSIS — J45909 Unspecified asthma, uncomplicated: Secondary | ICD-10-CM

## 2015-04-26 NOTE — Patient Instructions (Signed)
Please continue QVAR 2 puffs twice a day Keep albuterol available to use 1-2 puffs up to every 4 hours if needed for shortness of breath.  Follow with Dr Lamonte Sakai in 6 months or sooner if you have any problems

## 2015-04-26 NOTE — Assessment & Plan Note (Signed)
She has frequent symptoms but seems to have achieved at least some degree of control back on Qvar. She does need albuterol supplementation, tries to minimize this due to side effects of tremor. I will keep her on this regimen for now although we will revisit possibly trying  Another LABA at some point in the future.

## 2015-04-26 NOTE — Progress Notes (Signed)
Subjective:    Patient ID: [name], female    DOB: 04-04-1945, 70 y.o.   MRN: [id number]  HPI OV 03/20/14 -- Patient comes in today for follow-up of her known chronic obstructive asthma. He has been maintained on Qvar, and at the last visit we tried Spiriva Respimat. Because of expense, she has been using half dose, and most recently has been having issues with increasing shortness of breath. She has been using her rescue inhaler excessively, but is unsure if this helps. She has not wanted to use LABA's because of side effects. He should also be kept in mind that she has some type of neuromuscular disease which appears to be progressing, wraps this is the cause of her dyspnea. She is scheduled to be evaluated at [facility] in the near future.  ROV 08/21/14 -- follow-up visit for patient who has been managed by Dr. Gwenette Greet for severe chronic obstructive disease with an asthmatic component based on a positive bronchodilator response on temporary function testing from 01/23/13 that I have personally reviewed. On that study she also had restricted lung volumes and a decreased diffusion capacity. 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 She was seen at [facility] re: the myopathy, was defined as mild-to-mod on EMG. Is planning to do some PT.  She is using Spiriva respimat 1 puff qd + QVAR bid. Rarely uses albuterol. She describes significant exertional dyspnea, especially with staircase, lifting.  She denies any cough, no mucous production. She underwent a walking oximetry w Dr [name], did not desaturate.   ROV 02/21/15 -- follow-up for severe COPD with an asthmatic component and a positive bronchodilator response. Also some coinciding restrictive lung disease that may be associated with a mitochondrial myopathy. She has been managed on Spiriva, Qvar, pro-air when necessary. She returns today c/o cough that is new compared with last visit. She has a lot of throat  clearing, seems to be exacerbated by Qvar. She is on Spiriva > causes urinary retention. She has some rhinitis, she is having a lot of reflux since last time.   ROV 04/26/15 -- patient is a history of severe COPD with a positive bronchodilator response on spirometry. I last saw her in January 2017. At her last visit we changed her bronchodilators and her bar because she has been having side effects. She has had a lot of throat clearing. She developed more dyspnea and went back on QVAR. She has used albuterol with relief but with tremor as a side effect. She tells me that she fell the other day, hit her R flank.   Review of Systems  Constitutional: Negative for fever and unexpected weight change.  HENT: Positive for congestion, postnasal drip and sinus pressure. Negative for dental problem, ear pain, nosebleeds, rhinorrhea, sneezing, sore throat and trouble swallowing.   Eyes: Negative for redness and itching.  Respiratory: Positive for cough, chest tightness and shortness of breath. Negative for wheezing.   Cardiovascular: Negative for palpitations and leg swelling.  Gastrointestinal: Negative for nausea and vomiting.  Genitourinary: Negative for dysuria.  Musculoskeletal: Negative for joint swelling.  Skin: Negative for rash.  Neurological: Negative for headaches.  Hematological: Does not bruise/bleed easily.  Psychiatric/Behavioral: Negative for dysphoric mood. The patient is not nervous/anxious.    Past Medical History  Diagnosis Date  . Pericarditis     age 57  . Pneumothorax   . Anxiety   . Iliotibial band syndrome     left knee  . Vitamin  D deficiency   . Migraine   . Asthma   . Anemia   . IBS (irritable bowel syndrome)   . Hemorrhoids   . Cholelithiasis   . Mitochondrial myopathy   . Heart attack ([place])   . Osteoporosis   . Allergy   . Blood transfusion without reported diagnosis   . 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 PVC's (premature ventricular contractions)   .  Normal coronary arteries     by cardiac catheterization performed by myself 02/25/01  . Chronic kidney disease     gallstones      Family History  Problem Relation Age of Onset  . Asthma Mother   . Arthritis Mother   . Depression Mother   . Cancer Father   . Emphysema Father   . Stroke Father   . Alcohol abuse Father   . Heart attack Father   . Colon cancer Neg Hx      Social History   Social History  . Marital Status: Married    Spouse Name: Richard  . Number of Children: 2  . Years of Education: Grad   Occupational History  . Psychologist, educational    Social History Main Topics  . Smoking status: Never Smoker   . Smokeless tobacco: Never Used     Comment: never used tobacco  . Alcohol Use: 1.2 oz/week    2 Glasses of wine per week     Comment: occasionally  . Drug Use: No  . Sexual Activity: Not on file   Other Topics Concern  . Not on file   Social History Narrative   Health Care POA:    Emergency Contact: husband, Francene Finders, (c) [phone]   End of Life Plan:    Who lives with you: husband   Any pets: none   Diet: Pt has a varied diet.  Eats 5 sm. meals throughout day, focuses on protein, doesn't care for fruits and vegetables very much.   Exercise: Pt has a personal training and exercises several times a week.   Seatbelts: Pt reports wearing seatbelt when in vehicle.   Nancy Fetter Exposure/Protection: Pt reports wearing sun protection.    Hobbies: reading, visiting with friends   Patient has a Scientist, water quality.   Patient has two children.   Patient is retired.   Patient does not drink any caffeine.   Patient is right handed.              Allergies  Allergen Reactions  . Dulera [Mometasone Furo-Formoterol Fum] Cough     Outpatient Prescriptions Prior to Visit  Medication Sig Dispense Refill  . aspirin 81 MG tablet Take 81 mg by mouth daily.    . Estradiol-Norethindrone Acet (LOPREEZA) 0.5-0.1 MG per tablet Take 1 tablet by mouth daily.    .  hydrocortisone valerate cream (WESTCORT) 0.2 % Apply 1 application topically 2 (two) times daily as needed (itching).     . LevOCARNitine L-Tartrate (L-CARNITINE) 500 MG CAPS Take 2 capsules (1,000 mg total) by mouth daily. 60 capsule 4  . LORazepam (ATIVAN) 0.5 MG tablet TAKE 1 TABLET BY MOUTH EVERY NIGHT AT BEDTIME 30 tablet 0  . Melatonin 3 MG TABS Take 3 mg by mouth at bedtime.     . pantoprazole (PROTONIX) 40 MG tablet Take 1 tablet (40 mg total) by mouth daily. 30 tablet 1  . sertraline (ZOLOFT) 50 MG tablet Take 50 mg by mouth every morning.  2  . Vitamin D, Ergocalciferol, (DRISDOL) 50000 UNITS CAPS capsule  Take 50,000 Units by mouth every Sunday.     . Wheat Dextrin (CLEAR SOLUBLE FIBER) POWD Take 5 mLs by mouth every morning.     . Cyanocobalamin (B-12) 1000 MCG/ML KIT Inject 1 applicator as directed every 30 (thirty) days. Last given 9/7    . primidone (MYSOLINE) 50 MG tablet Week 1:  1/4 pill.  Week 2: 1/2 pill at bedtime.  Week 3:  3/4 pill at bedtime Week 4:  1 pill at bed.  Week 5: 1.5 pills at bed. Week 6: 2 pills at bed 60 tablet 4   No facility-administered medications prior to visit.         Objective:   Physical Exam Filed Vitals:   04/26/15 1620  BP: 122/82  Pulse: 90  Weight: 144 lb (65.318 kg)  SpO2: 98%   Gen: Pleasant, well-nourished, in no distress,  normal affect  ENT: No lesions,  mouth clear,  oropharynx clear, no postnasal drip  Neck: No JVD, no TMG, no carotid bruits  Lungs: No use of accessory muscles, clear without rales or rhonchi  Cardiovascular: RRR, heart sounds normal, no murmur or gallops, no peripheral edema  Musculoskeletal: No deformities, no cyanosis or clubbing  Neuro: alert, non focal  Skin: Warm, no lesions or rashes       Assessment & Plan:  Chronic obstructive airway disease with asthma She has frequent symptoms but seems to have achieved at least some degree of control back on Qvar. She does need albuterol  supplementation, tries to minimize this due to side effects of tremor. I will keep her on this regimen for now although we will revisit possibly trying  Another LABA at some point in the future.

## 2015-05-30 DIAGNOSIS — I788 Other diseases of capillaries: Secondary | ICD-10-CM | POA: Diagnosis not present

## 2015-05-30 DIAGNOSIS — L821 Other seborrheic keratosis: Secondary | ICD-10-CM | POA: Diagnosis not present

## 2015-05-30 DIAGNOSIS — L570 Actinic keratosis: Secondary | ICD-10-CM | POA: Diagnosis not present

## 2015-05-30 DIAGNOSIS — D225 Melanocytic nevi of trunk: Secondary | ICD-10-CM | POA: Diagnosis not present

## 2015-06-04 ENCOUNTER — Other Ambulatory Visit: Payer: Self-pay | Admitting: Emergency Medicine

## 2015-06-06 ENCOUNTER — Telehealth: Payer: Self-pay | Admitting: *Deleted

## 2015-06-06 ENCOUNTER — Encounter: Payer: Self-pay | Admitting: Hematology & Oncology

## 2015-06-06 ENCOUNTER — Ambulatory Visit (HOSPITAL_BASED_OUTPATIENT_CLINIC_OR_DEPARTMENT_OTHER): Payer: Medicare Other | Admitting: Hematology & Oncology

## 2015-06-06 ENCOUNTER — Other Ambulatory Visit (HOSPITAL_BASED_OUTPATIENT_CLINIC_OR_DEPARTMENT_OTHER): Payer: Medicare Other

## 2015-06-06 VITALS — BP 128/66 | HR 89 | Temp 97.9°F | Resp 16 | Ht 63.0 in | Wt 144.0 lb

## 2015-06-06 DIAGNOSIS — G713 Mitochondrial myopathy, not elsewhere classified: Secondary | ICD-10-CM | POA: Diagnosis not present

## 2015-06-06 DIAGNOSIS — D510 Vitamin B12 deficiency anemia due to intrinsic factor deficiency: Secondary | ICD-10-CM

## 2015-06-06 DIAGNOSIS — D45 Polycythemia vera: Secondary | ICD-10-CM

## 2015-06-06 DIAGNOSIS — G250 Essential tremor: Secondary | ICD-10-CM

## 2015-06-06 LAB — CMP (CANCER CENTER ONLY)
ALT(SGPT): 45 U/L (ref 10–47)
AST: 51 U/L — ABNORMAL HIGH (ref 11–38)
Albumin: 3.1 g/dL — ABNORMAL LOW (ref 3.3–5.5)
Alkaline Phosphatase: 72 U/L (ref 26–84)
BUN, Bld: 13 mg/dL (ref 7–22)
CO2: 24 mEq/L (ref 18–33)
Calcium: 8.8 mg/dL (ref 8.0–10.3)
Chloride: 101 mEq/L (ref 98–108)
Creat: 0.6 mg/dl (ref 0.6–1.2)
Glucose, Bld: 110 mg/dL (ref 73–118)
Potassium: 4.1 mEq/L (ref 3.3–4.7)
Sodium: 139 mEq/L (ref 128–145)
Total Bilirubin: 0.9 mg/dl (ref 0.20–1.60)
Total Protein: 6.7 g/dL (ref 6.4–8.1)

## 2015-06-06 LAB — CBC WITH DIFFERENTIAL (CANCER CENTER ONLY)
BASO#: 0 10*3/uL (ref 0.0–0.2)
BASO%: 0.3 % (ref 0.0–2.0)
EOS%: 1 % (ref 0.0–7.0)
Eosinophils Absolute: 0.1 10*3/uL (ref 0.0–0.5)
HCT: 43.1 % (ref 34.8–46.6)
HGB: 14.3 g/dL (ref 11.6–15.9)
LYMPH#: 0.9 10*3/uL (ref 0.9–3.3)
LYMPH%: 9.5 % — ABNORMAL LOW (ref 14.0–48.0)
MCH: 30.6 pg (ref 26.0–34.0)
MCHC: 33.2 g/dL (ref 32.0–36.0)
MCV: 92 fL (ref 81–101)
MONO#: 0.8 10*3/uL (ref 0.1–0.9)
MONO%: 9.3 % (ref 0.0–13.0)
NEUT#: 7.2 10*3/uL — ABNORMAL HIGH (ref 1.5–6.5)
NEUT%: 79.9 % (ref 39.6–80.0)
Platelets: 174 10*3/uL (ref 145–400)
RBC: 4.68 10*6/uL (ref 3.70–5.32)
RDW: 18.4 % — ABNORMAL HIGH (ref 11.1–15.7)
WBC: 9 10*3/uL (ref 3.9–10.0)

## 2015-06-06 NOTE — Telephone Encounter (Signed)
Per desk RN from [facility] office, I have scheduled appt for tomorrow. She will give patient appt.

## 2015-06-06 NOTE — Progress Notes (Signed)
Hematology and Oncology Follow Up Visit  [name]:[id number] May 15, 1945 70 y.o. 06/06/2015   Principle Diagnosis:  1. 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 2. Hemochromatosis (H63D heterozygote mutation). 3. Asthma. 4. Mitochondrial myopathy.  Current Therapy:   1. Phlebotomy to maintain hematocrit below 42%. 2. Aspirin 81 mg p.o. daily.     Interim History:  [name] is coming for followup.  She is not feeling too well. This tremor of her recently getting worse. She was offered a primidone by her neurologist last year. She did not want to take this initially. It seems like she might be getting a little worse so I told her that it would not be about idea.  She does not have a lot of energy. She disc its fatigue easily.  We have not had to phlebotomize her for several months.  We last saw her in late March, her ferritin was 32 with iron saturation of 22%.     She's had no change in bowel or bladder habits.  She's had no fever. She's had no cough. She does take some medicine for her underlying COPD.  Overall, her performance status is ECOG 1-2..  Medications:  Current outpatient prescriptions:  .  aspirin 81 MG tablet, Take 81 mg by mouth daily., Disp: , Rfl:  .  Estradiol-Norethindrone Acet (LOPREEZA) 0.5-0.1 MG per tablet, Take 1 tablet by mouth daily., Disp: , Rfl:  .  hydrocortisone valerate cream (WESTCORT) 0.2 %, Apply 1 application topically 2 (two) times daily as needed (itching). , Disp: , Rfl:  .  LevOCARNitine L-Tartrate (L-CARNITINE) 500 MG CAPS, Take 2 capsules (1,000 mg total) by mouth daily., Disp: 60 capsule, Rfl: 4 .  LORazepam (ATIVAN) 0.5 MG tablet, TAKE 1 TABLET BY MOUTH EVERY NIGHT AT BEDTIME, Disp: 30 tablet, Rfl: 0 .  Melatonin 3 MG TABS, Take 3 mg by mouth at bedtime. , Disp: , Rfl:  .  pantoprazole (PROTONIX) 40 MG tablet, TAKE 1 TABLET(40 MG) BY MOUTH DAILY, Disp: 90 tablet, Rfl: 1 .  QVAR 80 MCG/ACT inhaler, INHALE 2 PUFFS INTO THE LUNGS BID,  Disp: , Rfl: 5 .  sertraline (ZOLOFT) 50 MG tablet, Take 50 mg by mouth every morning., Disp: , Rfl: 2 .  Vitamin D, Ergocalciferol, (DRISDOL) 50000 UNITS CAPS capsule, Take 50,000 Units by mouth every Sunday. , Disp: , Rfl:  .  Wheat Dextrin (CLEAR SOLUBLE FIBER) POWD, Take 5 mLs by mouth every morning. , Disp: , Rfl:   Allergies:  Allergies  Allergen Reactions  . Dulera [Mometasone Furo-Formoterol Fum] Cough    Past Medical History, Surgical history, Social history, and Family History were reviewed and updated.  Review of Systems: As above  Physical Exam:  height is 5\' 3"  (1.6 m) and weight is 144 lb (65.318 kg). Her oral temperature is 97.9 F (36.6 C). Her blood pressure is 128/66 and her pulse is 89. Her respiration is 16.   Fairly well developed and well-nourished white female. Head and neck exam shows no ocular or oral lesions. There are no palpable cervical or supraclavicular lymph nodes. Lungs are clear. Cardiac exam regular rate and rhythm with no murmurs, rubs or bruits. Abdomen is soft. She has good bowel sounds. There is no fluid wave. There is no palpable liver or spleen tip. Extremities shows no clubbing, cyanosis or edema. She has decent muscle tone. She has some muscle atrophy bilaterally. Neurological exam is non-focal. Skin exam no rashes.  Lab Results  Component Value Date   WBC  9.0 06/06/2015   HGB 14.3 06/06/2015   HCT 43.1 06/06/2015   MCV 92 06/06/2015   PLT 174 06/06/2015     Chemistry      Component Value Date/Time   NA 139 06/06/2015 1332   NA 137 04/23/2015 1136   NA 135 09/11/2014 1146   K 4.1 06/06/2015 1332   K 3.7 04/23/2015 1136   K 4.0 09/11/2014 1146   CL 101 06/06/2015 1332   CL 103 09/11/2014 1146   CO2 24 06/06/2015 1332   CO2 21* 04/23/2015 1136   CO2 22 09/11/2014 1146   BUN 13 06/06/2015 1332   BUN 9.6 04/23/2015 1136   BUN 11 09/11/2014 1146   CREATININE 0.6 06/06/2015 1332   CREATININE 0.7 04/23/2015 1136   CREATININE 0.57*  09/11/2014 1146      Component Value Date/Time   CALCIUM 8.8 06/06/2015 1332   CALCIUM 9.0 04/23/2015 1136   CALCIUM 8.9 09/11/2014 1146   ALKPHOS 72 06/06/2015 1332   ALKPHOS 76 04/23/2015 1136   ALKPHOS 62 09/11/2014 1146   AST 51* 06/06/2015 1332   AST 36* 04/23/2015 1136   AST 30 09/11/2014 1146   ALT 45 06/06/2015 1332   ALT 37 04/23/2015 1136   ALT 30* 09/11/2014 1146   BILITOT 0.90 06/06/2015 1332   BILITOT 0.93 04/23/2015 1136   BILITOT 0.6 09/11/2014 1146         Impression and Plan: [name] is 70 year old female who. She has polycythemia. She has hemachromatosis. She has a mitochondrial myopathy.  She does need to be phlebotomized. Because of transportation issues, we will have this done at [place] [facility].  I do feel bad by the neurological problems that she is having. It sounds like this might be from the mitochondrial myopathy. She is not taking the primidone that was prescribed by the neurologist at [facility]. She's worried about the side effects.  I'm not sure when she will see the neurologist again.  I will see her back in 6 weeks.  As always, spent about 30-35 minutes with her.    [name], MD 5/11/20174:12 PM

## 2015-06-07 ENCOUNTER — Other Ambulatory Visit: Payer: Self-pay | Admitting: Family

## 2015-06-07 ENCOUNTER — Ambulatory Visit (HOSPITAL_BASED_OUTPATIENT_CLINIC_OR_DEPARTMENT_OTHER): Payer: Medicare Other

## 2015-06-07 VITALS — BP 131/70 | HR 101 | Temp 97.6°F | Resp 18

## 2015-06-07 DIAGNOSIS — D45 Polycythemia vera: Secondary | ICD-10-CM

## 2015-06-07 LAB — IRON AND TIBC
%SAT: 26 % (ref 21–57)
Iron: 87 ug/dL (ref 41–142)
TIBC: 331 ug/dL (ref 236–444)
UIBC: 244 ug/dL (ref 120–384)

## 2015-06-07 LAB — VITAMIN B12: Vitamin B12: 340 pg/mL (ref 211–946)

## 2015-06-07 LAB — FERRITIN: Ferritin: 35 ng/ml (ref 9–269)

## 2015-06-07 MED ORDER — SODIUM CHLORIDE 0.9 % IV SOLN
Freq: Once | INTRAVENOUS | Status: AC
  Administered 2015-06-07: 15:00:00 via INTRAVENOUS

## 2015-06-07 NOTE — Progress Notes (Signed)
Phlebotomy per MD orders in office note. #18 gauge angio cath to left ac, 350 gm's removed from site, than it clotted. Restarted # 18 gauge angio cath to left forearm and 154 gm's, for total of 504 gm's removed. Tolerated procedure, no complaints. Ate snack and drank fluids. 500 ml Normal saline given after procedure thru #18 in left forearm. Observed and vital signs obtained prior to discharge home.

## 2015-06-07 NOTE — Patient Instructions (Signed)
Therapeutic Phlebotomy Discharge Instructions  - Increase your fluid intake over the next 4 hours  - No smoking for 30 minutes  - Avoid using the affected arm (the one you had the blood drawn from) for heavy lifting or other activities.  - You may resume all normal activities after 30 minutes.  You are to notify the office if you experience:   - Persistent dizziness and/or lightheadedness -Uncontrolled or excessive bleeding at the site.  Therapeutic Phlebotomy, Care After Refer to this sheet in the next few weeks. These instructions provide you with information about caring for yourself after your procedure. Your health care provider may also give you more specific instructions. Your treatment has been planned according to current medical practices, but problems sometimes occur. Call your health care provider if you have any problems or questions after your procedure. WHAT TO EXPECT AFTER THE PROCEDURE After your procedure, it is common to have:  Light-headedness or dizziness. You may feel faint.  Nausea.  Tiredness. HOME CARE INSTRUCTIONS Activities  Return to your normal activities as directed by your health care provider. Most people can go back to their normal activities right away.  Avoid strenuous physical activity and heavy lifting or pulling for about 5 hours after the procedure. Do not lift anything that is heavier than 10 lb (4.5 kg).  Athletes should avoid strenuous exercise for at least 12 hours.  Change positions slowly for the remainder of the day. This will help to prevent light-headedness or fainting.  If you feel light-headed, lie down until the feeling goes away. Eating and Drinking  Be sure to eat well-balanced meals for the next 24 hours.  Drink enough fluid to keep your urine clear or pale yellow.  Avoid drinking alcohol on the day that you had the procedure. Care of the Needle Insertion Site  Keep your bandage dry. You can remove the bandage after  about 5 hours or as directed by your health care provider.  If you have bleeding from the needle insertion site, elevate your arm and press firmly on the site until the bleeding stops.  If you have bruising at the site, apply ice to the area:  Put ice in a plastic bag.  Place a towel between your skin and the bag.  Leave the ice on for 20 minutes, 2-3 times a day for the first 24 hours.  If the swelling does not go away after 24 hours, apply a warm, moist washcloth to the area for 20 minutes, 2-3 times a day. General Instructions  Avoid smoking for at least 30 minutes after the procedure.  Keep all follow-up visits as directed by your health care provider. It is important to continue with further therapeutic phlebotomy treatments as directed. SEEK MEDICAL CARE IF:  You have redness, swelling, or pain at the needle insertion site.  You have fluid, blood, or pus coming from the needle insertion site.  You feel light-headed, dizzy, or nauseated, and the feeling does not go away.  You notice new bruising at the needle insertion site.  You feel weaker than normal.  You have a fever or chills. SEEK IMMEDIATE MEDICAL CARE IF:  You have severe nausea or vomiting.  You have chest pain.  You have trouble breathing.   This information is not intended to replace advice given to you by your health care provider. Make sure you discuss any questions you have with your health care provider.   Document Released: 06/16/2010 Document Revised: 05/29/2014 Document Reviewed: 01/08/2014  Chartered certified accountant Patient Education [employer].

## 2015-06-16 ENCOUNTER — Other Ambulatory Visit: Payer: Self-pay | Admitting: Emergency Medicine

## 2015-06-20 DIAGNOSIS — H43813 Vitreous degeneration, bilateral: Secondary | ICD-10-CM | POA: Diagnosis not present

## 2015-06-20 DIAGNOSIS — H2513 Age-related nuclear cataract, bilateral: Secondary | ICD-10-CM | POA: Diagnosis not present

## 2015-06-20 DIAGNOSIS — D3132 Benign neoplasm of left choroid: Secondary | ICD-10-CM | POA: Diagnosis not present

## 2015-06-25 ENCOUNTER — Other Ambulatory Visit: Payer: Self-pay | Admitting: *Deleted

## 2015-06-25 MED ORDER — ALBUTEROL SULFATE HFA 108 (90 BASE) MCG/ACT IN AERS: 2.0000 | INHALATION_SPRAY | Freq: Four times a day (QID) | RESPIRATORY_TRACT | Status: DC | PRN

## 2015-07-11 DIAGNOSIS — Z1231 Encounter for screening mammogram for malignant neoplasm of breast: Secondary | ICD-10-CM | POA: Diagnosis not present

## 2015-07-16 ENCOUNTER — Other Ambulatory Visit (HOSPITAL_BASED_OUTPATIENT_CLINIC_OR_DEPARTMENT_OTHER): Payer: Medicare Other

## 2015-07-16 ENCOUNTER — Ambulatory Visit (HOSPITAL_BASED_OUTPATIENT_CLINIC_OR_DEPARTMENT_OTHER): Payer: Medicare Other | Admitting: Hematology & Oncology

## 2015-07-16 ENCOUNTER — Ambulatory Visit (HOSPITAL_BASED_OUTPATIENT_CLINIC_OR_DEPARTMENT_OTHER): Payer: Medicare Other

## 2015-07-16 VITALS — BP 110/46 | HR 75 | Resp 16

## 2015-07-16 VITALS — BP 130/72 | HR 86 | Temp 97.6°F | Wt 141.0 lb

## 2015-07-16 DIAGNOSIS — D45 Polycythemia vera: Secondary | ICD-10-CM | POA: Diagnosis not present

## 2015-07-16 LAB — CBC WITH DIFFERENTIAL (CANCER CENTER ONLY)
BASO#: 0 10*3/uL (ref 0.0–0.2)
BASO%: 0.2 % (ref 0.0–2.0)
EOS%: 1 % (ref 0.0–7.0)
Eosinophils Absolute: 0.1 10*3/uL (ref 0.0–0.5)
HCT: 44.6 % (ref 34.8–46.6)
HGB: 14.9 g/dL (ref 11.6–15.9)
LYMPH#: 0.8 10*3/uL — ABNORMAL LOW (ref 0.9–3.3)
LYMPH%: 8.8 % — ABNORMAL LOW (ref 14.0–48.0)
MCH: 32.1 pg (ref 26.0–34.0)
MCHC: 33.4 g/dL (ref 32.0–36.0)
MCV: 96 fL (ref 81–101)
MONO#: 0.9 10*3/uL (ref 0.1–0.9)
MONO%: 9.6 % (ref 0.0–13.0)
NEUT#: 7.2 10*3/uL — ABNORMAL HIGH (ref 1.5–6.5)
NEUT%: 80.4 % — ABNORMAL HIGH (ref 39.6–80.0)
Platelets: 181 10*3/uL (ref 145–400)
RBC: 4.64 10*6/uL (ref 3.70–5.32)
RDW: 15.2 % (ref 11.1–15.7)
WBC: 8.9 10*3/uL (ref 3.9–10.0)

## 2015-07-16 LAB — CMP (CANCER CENTER ONLY)
ALT(SGPT): 40 U/L (ref 10–47)
AST: 52 U/L — ABNORMAL HIGH (ref 11–38)
Albumin: 3.2 g/dL — ABNORMAL LOW (ref 3.3–5.5)
Alkaline Phosphatase: 76 U/L (ref 26–84)
BUN, Bld: 11 mg/dL (ref 7–22)
CO2: 31 mEq/L (ref 18–33)
Calcium: 9.2 mg/dL (ref 8.0–10.3)
Chloride: 100 mEq/L (ref 98–108)
Creat: 0.7 mg/dl (ref 0.6–1.2)
Glucose, Bld: 85 mg/dL (ref 73–118)
Potassium: 4.2 mEq/L (ref 3.3–4.7)
Sodium: 135 mEq/L (ref 128–145)
Total Bilirubin: 1.1 mg/dl (ref 0.20–1.60)
Total Protein: 7.1 g/dL (ref 6.4–8.1)

## 2015-07-16 LAB — IRON AND TIBC
%SAT: 18 % — ABNORMAL LOW (ref 21–57)
Iron: 63 ug/dL (ref 41–142)
TIBC: 341 ug/dL (ref 236–444)
UIBC: 279 ug/dL (ref 120–384)

## 2015-07-16 LAB — FERRITIN: Ferritin: 30 ng/ml (ref 9–269)

## 2015-07-16 MED ORDER — SODIUM CHLORIDE 0.9 % IV SOLN
Freq: Once | INTRAVENOUS | Status: AC
  Administered 2015-07-16: 14:00:00 via INTRAVENOUS

## 2015-07-16 NOTE — Progress Notes (Signed)
Hematology and Oncology Follow Up Visit  ALLIZON OLTHOFF SH:[id number] 1945/05/14 70 y.o. 07/16/2015   Principle Diagnosis:  1. 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 2. Hemochromatosis (H63D heterozygote mutation). 3. Asthma. 4. Mitochondrial myopathy.  Current Therapy:   1. Phlebotomy to maintain hematocrit below 42%. 2. Aspirin 81 mg p.o. daily.     Interim History:  Ms.  Herlong is coming for followup.  She is not feeling too well. This tremor of her recently getting worse. She was offered a primidone by her neurologist last year. She did not want to take this initially. It seems like she might be getting a little worse so I told her that it would not be about idea.  She does not have a lot of energy. She disc its fatigue easily.  We have not had to phlebotomize her for several months.  We last saw her in late May,  her ferritin was 35 with iron saturation of 26 oh %.     She's had no change in bowel or bladder habits.  She's had no fever. She's had no cough. She does take some medicine for her underlying COPD.  Overall, her performance status is ECOG 1-2..  Medications:  Current outpatient prescriptions:  .  albuterol (PROAIR HFA) 108 (90 Base) MCG/ACT inhaler, Inhale 2 puffs into the lungs every 6 (six) hours as needed for wheezing or shortness of breath., Disp: 1 Inhaler, Rfl: 5 .  aspirin 81 MG tablet, Take 81 mg by mouth daily., Disp: , Rfl:  .  Estradiol-Norethindrone Acet (LOPREEZA) 0.5-0.1 MG per tablet, Take 1 tablet by mouth daily., Disp: , Rfl:  .  hydrocortisone valerate cream (WESTCORT) 0.2 %, Apply 1 application topically 2 (two) times daily as needed (itching). , Disp: , Rfl:  .  LevOCARNitine L-Tartrate (L-CARNITINE) 500 MG CAPS, Take 2 capsules (1,000 mg total) by mouth daily., Disp: 60 capsule, Rfl: 4 .  LORazepam (ATIVAN) 0.5 MG tablet, TAKE 1 TABLET BY MOUTH EVERY NIGHT AT BEDTIME, Disp: 30 tablet, Rfl: 0 .  Melatonin 3 MG TABS, Take 3 mg by mouth at bedtime.  , Disp: , Rfl:  .  pantoprazole (PROTONIX) 40 MG tablet, TAKE 1 TABLET(40 MG) BY MOUTH DAILY, Disp: 90 tablet, Rfl: 1 .  QVAR 80 MCG/ACT inhaler, INHALE 2 PUFFS INTO THE LUNGS BID, Disp: , Rfl: 5 .  sertraline (ZOLOFT) 50 MG tablet, Take 50 mg by mouth every morning., Disp: , Rfl: 2 .  Vitamin D, Ergocalciferol, (DRISDOL) 50000 UNITS CAPS capsule, Take 50,000 Units by mouth every Sunday. , Disp: , Rfl:  .  Wheat Dextrin (CLEAR SOLUBLE FIBER) POWD, Take 5 mLs by mouth every morning. , Disp: , Rfl:   Allergies:  Allergies  Allergen Reactions  . Dulera [Mometasone Furo-Formoterol Fum] Cough    Past Medical History, Surgical history, Social history, and Family History were reviewed and updated.  Review of Systems: As above  Physical Exam:  weight is 141 lb (63.957 kg). Her oral temperature is 97.6 F (36.4 C). Her blood pressure is 130/72 and her pulse is 86.   Fairly well developed and well-nourished white female. Head and neck exam shows no ocular or oral lesions. There are no palpable cervical or supraclavicular lymph nodes. Lungs are clear. Cardiac exam regular rate and rhythm with no murmurs, rubs or bruits. Abdomen is soft. She has good bowel sounds. There is no fluid wave. There is no palpable liver or spleen tip. Extremities shows no clubbing, cyanosis or edema. She has decent muscle  tone. She has some muscle atrophy bilaterally. Neurological exam is non-focal. Skin exam no rashes.  Lab Results  Component Value Date   WBC 8.9 07/16/2015   HGB 14.9 07/16/2015   HCT 44.6 07/16/2015   MCV 96 07/16/2015   PLT 181 07/16/2015     Chemistry      Component Value Date/Time   NA 135 07/16/2015 1138   NA 137 04/23/2015 1136   NA 135 09/11/2014 1146   K 4.2 07/16/2015 1138   K 3.7 04/23/2015 1136   K 4.0 09/11/2014 1146   CL 100 07/16/2015 1138   CL 103 09/11/2014 1146   CO2 31 07/16/2015 1138   CO2 21* 04/23/2015 1136   CO2 22 09/11/2014 1146   BUN 11 07/16/2015 1138   BUN 9.6  04/23/2015 1136   BUN 11 09/11/2014 1146   CREATININE 0.7 07/16/2015 1138   CREATININE 0.7 04/23/2015 1136   CREATININE 0.57* 09/11/2014 1146      Component Value Date/Time   CALCIUM 9.2 07/16/2015 1138   CALCIUM 9.0 04/23/2015 1136   CALCIUM 8.9 09/11/2014 1146   ALKPHOS 76 07/16/2015 1138   ALKPHOS 76 04/23/2015 1136   ALKPHOS 62 09/11/2014 1146   AST 52* 07/16/2015 1138   AST 36* 04/23/2015 1136   AST 30 09/11/2014 1146   ALT 40 07/16/2015 1138   ALT 37 04/23/2015 1136   ALT 30* 09/11/2014 1146   BILITOT 1.10 07/16/2015 1138   BILITOT 0.93 04/23/2015 1136   BILITOT 0.6 09/11/2014 1146         Impression and Plan: Ms. Bourdo is 70 year old female who. She has polycythemia. She has hemachromatosis. She has a mitochondrial myopathy.  She does need to be phlebotomized. She actually will be phlebotomized today. She'll have it done here.   I do feel bad by the neurological problems that she is having. It sounds like this might be from the mitochondrial myopathy. She is not taking the primidone that was prescribed by the neurologist at [facility]. She's worried about the side effects.  I'm not sure when she will see the neurologist again.  I will see her back in 5 weeks.  As always, spent about 30-35 minutes with she and her husband.   [name], MD 6/20/20171:05 PM

## 2015-07-16 NOTE — Patient Instructions (Signed)

## 2015-07-17 ENCOUNTER — Encounter: Payer: Self-pay | Admitting: *Deleted

## 2015-08-20 ENCOUNTER — Ambulatory Visit: Payer: Medicare Other | Admitting: Hematology & Oncology

## 2015-08-20 ENCOUNTER — Other Ambulatory Visit: Payer: Medicare Other

## 2015-08-22 ENCOUNTER — Other Ambulatory Visit (HOSPITAL_BASED_OUTPATIENT_CLINIC_OR_DEPARTMENT_OTHER): Payer: Medicare Other

## 2015-08-22 ENCOUNTER — Ambulatory Visit (HOSPITAL_BASED_OUTPATIENT_CLINIC_OR_DEPARTMENT_OTHER): Payer: Medicare Other | Admitting: Hematology & Oncology

## 2015-08-22 VITALS — BP 132/60 | HR 90 | Temp 97.5°F | Resp 20 | Wt 141.0 lb

## 2015-08-22 DIAGNOSIS — G713 Mitochondrial myopathy, not elsewhere classified: Secondary | ICD-10-CM

## 2015-08-22 DIAGNOSIS — D45 Polycythemia vera: Secondary | ICD-10-CM

## 2015-08-22 DIAGNOSIS — Z148 Genetic carrier of other disease: Secondary | ICD-10-CM

## 2015-08-22 LAB — CBC WITH DIFFERENTIAL (CANCER CENTER ONLY)
BASO#: 0 10*3/uL (ref 0.0–0.2)
BASO%: 0.3 % (ref 0.0–2.0)
EOS%: 1 % (ref 0.0–7.0)
Eosinophils Absolute: 0.1 10*3/uL (ref 0.0–0.5)
HCT: 41.2 % (ref 34.8–46.6)
HGB: 13.4 g/dL (ref 11.6–15.9)
LYMPH#: 0.8 10*3/uL — ABNORMAL LOW (ref 0.9–3.3)
LYMPH%: 8.3 % — ABNORMAL LOW (ref 14.0–48.0)
MCH: 30.9 pg (ref 26.0–34.0)
MCHC: 32.5 g/dL (ref 32.0–36.0)
MCV: 95 fL (ref 81–101)
MONO#: 0.8 10*3/uL (ref 0.1–0.9)
MONO%: 7.9 % (ref 0.0–13.0)
NEUT#: 8.1 10*3/uL — ABNORMAL HIGH (ref 1.5–6.5)
NEUT%: 82.5 % — ABNORMAL HIGH (ref 39.6–80.0)
Platelets: 193 10*3/uL (ref 145–400)
RBC: 4.34 10*6/uL (ref 3.70–5.32)
RDW: 13.2 % (ref 11.1–15.7)
WBC: 9.8 10*3/uL (ref 3.9–10.0)

## 2015-08-22 LAB — COMPREHENSIVE METABOLIC PANEL
ALT: 45 U/L (ref 0–55)
AST: 53 U/L — ABNORMAL HIGH (ref 5–34)
Albumin: 3.4 g/dL — ABNORMAL LOW (ref 3.5–5.0)
Alkaline Phosphatase: 81 U/L (ref 40–150)
Anion Gap: 10 mEq/L (ref 3–11)
BUN: 12.2 mg/dL (ref 7.0–26.0)
CO2: 22 mEq/L (ref 22–29)
Calcium: 8.8 mg/dL (ref 8.4–10.4)
Chloride: 106 mEq/L (ref 98–109)
Creatinine: 0.7 mg/dL (ref 0.6–1.1)
EGFR: 87 mL/min/{1.73_m2} — ABNORMAL LOW (ref >90)
Glucose: 104 mg/dl (ref 70–140)
Potassium: 3.9 mEq/L (ref 3.5–5.1)
Sodium: 138 mEq/L (ref 136–145)
Total Bilirubin: 0.75 mg/dL (ref 0.20–1.20)
Total Protein: 7.1 g/dL (ref 6.4–8.3)

## 2015-08-22 NOTE — Progress Notes (Signed)
Hematology and Oncology Follow Up Visit  [name] [id number] 12/03/1945 70 y.o. 08/22/2015   Principle Diagnosis:  1. 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 2. Hemochromatosis (H63D heterozygote mutation). 3. Asthma. 4. Mitochondrial myopathy.  Current Therapy:   1. Phlebotomy to maintain hematocrit below 42%. 2. Aspirin 81 mg p.o. daily.     Interim History:  [name] is coming for followup.  She is not feeling too well. She is having a lot of sinus congestion and drainage. She has a constant cough from this drainage. She would like to see an otolaryngologist. I'll see about making a referral.   There is not been any nausea or vomiting.   With the hot weather, she has been staying indoors.   She still has this tremor. She has an essential tremor which might be from the mitochondrial myopathy. She does not want to take the medication given to her by her neurologist.   When we last saw her, we did iron studies.  Her ferritin was 30. Her iron saturation was 18%.  She has had no fever. Her appetite has been okay. She's had no diarrhea. She's had no leg swelling. She has had a area of ecchymoses on the dorsum of the left foot between the first and second toe.  Overall, her performance status is ECOG 1-2..  Medications:  Current Outpatient Prescriptions:  .  albuterol (PROAIR HFA) 108 (90 Base) MCG/ACT inhaler, Inhale 2 puffs into the lungs every 6 (six) hours as needed for wheezing or shortness of breath., Disp: 1 Inhaler, Rfl: 5 .  aspirin 81 MG tablet, Take 81 mg by mouth daily., Disp: , Rfl:  .  Estradiol-Norethindrone Acet (LOPREEZA) 0.5-0.1 MG per tablet, Take 1 tablet by mouth daily., Disp: , Rfl:  .  hydrocortisone valerate cream (WESTCORT) 0.2 %, Apply 1 application topically 2 (two) times daily as needed (itching). , Disp: , Rfl:  .  LevOCARNitine L-Tartrate (L-CARNITINE) 500 MG CAPS, Take 2 capsules (1,000 mg total) by mouth daily., Disp: 60 capsule, Rfl: 4 .   LORazepam (ATIVAN) 0.5 MG tablet, TAKE 1 TABLET BY MOUTH EVERY NIGHT AT BEDTIME, Disp: 30 tablet, Rfl: 0 .  Melatonin 3 MG TABS, Take 3 mg by mouth at bedtime. , Disp: , Rfl:  .  QVAR 80 MCG/ACT inhaler, INHALE 2 PUFFS INTO THE LUNGS BID, Disp: , Rfl: 5 .  sertraline (ZOLOFT) 50 MG tablet, Take 50 mg by mouth every morning., Disp: , Rfl: 2 .  Vitamin D, Ergocalciferol, (DRISDOL) 50000 UNITS CAPS capsule, Take 50,000 Units by mouth every Sunday. , Disp: , Rfl:  .  Wheat Dextrin (CLEAR SOLUBLE FIBER) POWD, Take 5 mLs by mouth every morning. , Disp: , Rfl:   Allergies:  Allergies  Allergen Reactions  . Dulera [Mometasone Furo-Formoterol Fum] Cough    Past Medical History, Surgical history, Social history, and Family History were reviewed and updated.  Review of Systems: As above  Physical Exam:  weight is 141 lb (64 kg). Her oral temperature is 97.5 F (36.4 C). Her blood pressure is 132/60 and her pulse is 90. Her respiration is 20.   Fairly well developed and well-nourished white female. Head and neck exam shows no ocular or oral lesions. There are no palpable cervical or supraclavicular lymph nodes. Lungs are clear. Cardiac exam regular rate and rhythm with no murmurs, rubs or bruits. Abdomen is soft. She has good bowel sounds. There is no fluid wave. There is no palpable liver or spleen tip. Extremities shows  no clubbing, cyanosis or edema. She has decent muscle tone. She has some muscle atrophy bilaterally. Neurological exam is non-focal. Skin exam no rashes.  Lab Results  Component Value Date   WBC 9.8 08/22/2015   HGB 13.4 08/22/2015   HCT 41.2 08/22/2015   MCV 95 08/22/2015   PLT 193 08/22/2015     Chemistry      Component Value Date/Time   NA 135 07/16/2015 1138   NA 137 04/23/2015 1136   K 4.2 07/16/2015 1138   K 3.7 04/23/2015 1136   CL 100 07/16/2015 1138   CO2 31 07/16/2015 1138   CO2 21 (L) 04/23/2015 1136   BUN 11 07/16/2015 1138   BUN 9.6 04/23/2015 1136    CREATININE 0.7 07/16/2015 1138   CREATININE 0.7 04/23/2015 1136      Component Value Date/Time   CALCIUM 9.2 07/16/2015 1138   CALCIUM 9.0 04/23/2015 1136   ALKPHOS 76 07/16/2015 1138   ALKPHOS 76 04/23/2015 1136   AST 52 (H) 07/16/2015 1138   AST 36 (H) 04/23/2015 1136   ALT 40 07/16/2015 1138   ALT 37 04/23/2015 1136   BILITOT 1.10 07/16/2015 1138   BILITOT 0.93 04/23/2015 1136         Impression and Plan: [name] is 70 year old female who. She has polycythemia. She has hemachromatosis. She has a mitochondrial myopathy.  She does not need to be phlebotomized.   I do feel bad by the neurological problems that she is having. It sounds like this might be from the mitochondrial myopathy. She is not taking the primidone that was prescribed by the neurologist at [facility]. She's worried about the side effects.  Her biggest issue right now is the cough and sinus congestion. We will see about making a referral for her to otolaryngology.  I will see her back in 5 weeks.  As always, spent about 30-35 minutes with she and her husband.   [name], MD 7/27/20171:59 PM

## 2015-08-23 LAB — IRON AND TIBC
%SAT: 18 % — ABNORMAL LOW (ref 21–57)
Iron: 63 ug/dL (ref 41–142)
TIBC: 359 ug/dL (ref 236–444)
UIBC: 295 ug/dL (ref 120–384)

## 2015-08-23 LAB — FERRITIN: Ferritin: 29 ng/ml (ref 9–269)

## 2015-09-01 ENCOUNTER — Telehealth: Payer: Self-pay | Admitting: Gastroenterology

## 2015-09-01 DIAGNOSIS — R1011 Right upper quadrant pain: Secondary | ICD-10-CM

## 2015-09-01 NOTE — Telephone Encounter (Signed)
Patient called this afternoon complaining of an episode of RUQ pain radiating to her back. She has a history of gallstones but has only rarely had symptoms. She reported severe pain which seems significantly improved at this point. She wanted to be seen tomorrow for this in clinic.   At this time, I counseled her if the pain recurs and worsens or persists, she needs to go to the ER. If she continues to improve, I have ordered CBC, Lipase, and LFTs to be done tomorrow.   Rollene Fare, can you please also schedule her for a US of the liver to reassess this. We will contact her with labs which I have already ordered. Thanks

## 2015-09-02 ENCOUNTER — Other Ambulatory Visit (INDEPENDENT_AMBULATORY_CARE_PROVIDER_SITE_OTHER): Payer: Medicare Other

## 2015-09-02 ENCOUNTER — Other Ambulatory Visit: Payer: Self-pay | Admitting: *Deleted

## 2015-09-02 DIAGNOSIS — R1011 Right upper quadrant pain: Secondary | ICD-10-CM

## 2015-09-02 LAB — CBC WITH DIFFERENTIAL/PLATELET
Basophils Absolute: 0 10*3/uL (ref 0.0–0.1)
Basophils Relative: 0.1 % (ref 0.0–3.0)
Eosinophils Absolute: 0.1 10*3/uL (ref 0.0–0.7)
Eosinophils Relative: 1 % (ref 0.0–5.0)
HCT: 40.4 % (ref 36.0–46.0)
Hemoglobin: 13.4 g/dL (ref 12.0–15.0)
Lymphocytes Relative: 7.5 % — ABNORMAL LOW (ref 12.0–46.0)
Lymphs Abs: 0.7 10*3/uL (ref 0.7–4.0)
MCHC: 33.2 g/dL (ref 30.0–36.0)
MCV: 90.4 fl (ref 78.0–100.0)
Monocytes Absolute: 0.7 10*3/uL (ref 0.1–1.0)
Monocytes Relative: 7.5 % (ref 3.0–12.0)
Neutro Abs: 8 10*3/uL — ABNORMAL HIGH (ref 1.4–7.7)
Neutrophils Relative %: 83.9 % — ABNORMAL HIGH (ref 43.0–77.0)
Platelets: 211 10*3/uL (ref 150.0–400.0)
RBC: 4.47 Mil/uL (ref 3.87–5.11)
RDW: 14.9 % (ref 11.5–15.5)
WBC: 9.5 10*3/uL (ref 4.0–10.5)

## 2015-09-02 LAB — HEPATIC FUNCTION PANEL
ALT: 62 U/L — ABNORMAL HIGH (ref 0–35)
AST: 75 U/L — ABNORMAL HIGH (ref 0–37)
Albumin: 3.7 g/dL (ref 3.5–5.2)
Alkaline Phosphatase: 98 U/L (ref 39–117)
Bilirubin, Direct: 0.2 mg/dL (ref 0.0–0.3)
Total Bilirubin: 0.9 mg/dL (ref 0.2–1.2)
Total Protein: 7 g/dL (ref 6.0–8.3)

## 2015-09-02 LAB — LIPASE: Lipase: 12 U/L (ref 11.0–59.0)

## 2015-09-02 NOTE — Telephone Encounter (Signed)
Scheduled US abdomen on 8//10/17 at 8:00 AM. NPO after midnight. Patient given appointment and instructions.

## 2015-09-03 DIAGNOSIS — R05 Cough: Secondary | ICD-10-CM | POA: Diagnosis not present

## 2015-09-03 DIAGNOSIS — R490 Dysphonia: Secondary | ICD-10-CM | POA: Diagnosis not present

## 2015-09-03 DIAGNOSIS — J343 Hypertrophy of nasal turbinates: Secondary | ICD-10-CM | POA: Diagnosis not present

## 2015-09-03 DIAGNOSIS — K219 Gastro-esophageal reflux disease without esophagitis: Secondary | ICD-10-CM | POA: Diagnosis not present

## 2015-09-03 DIAGNOSIS — R6889 Other general symptoms and signs: Secondary | ICD-10-CM | POA: Diagnosis not present

## 2015-09-05 ENCOUNTER — Ambulatory Visit (HOSPITAL_COMMUNITY): Payer: Medicare Other

## 2015-09-06 ENCOUNTER — Ambulatory Visit (HOSPITAL_COMMUNITY)
Admission: RE | Admit: 2015-09-06 | Discharge: 2015-09-06 | Disposition: A | Discharge status: Home / Self Care | Payer: Medicare Other | Source: Ambulatory Visit | Attending: Gastroenterology | Admitting: Gastroenterology

## 2015-09-06 DIAGNOSIS — K8020 Calculus of gallbladder without cholecystitis without obstruction: Secondary | ICD-10-CM | POA: Diagnosis not present

## 2015-09-06 DIAGNOSIS — R1011 Right upper quadrant pain: Secondary | ICD-10-CM | POA: Diagnosis not present

## 2015-09-09 ENCOUNTER — Telehealth: Payer: Self-pay | Admitting: Gastroenterology

## 2015-09-10 NOTE — Telephone Encounter (Signed)
Patient advised that Dr. Havery Moros is out of the office this week.  I did relay that she had gallstones on the [place], she stated they have been there for a "years".  She is aware that we will call as soon as Dr. Havery Moros has a chance to review

## 2015-09-23 DIAGNOSIS — G713 Mitochondrial myopathy, not elsewhere classified: Secondary | ICD-10-CM | POA: Diagnosis not present

## 2015-09-23 DIAGNOSIS — K8020 Calculus of gallbladder without cholecystitis without obstruction: Secondary | ICD-10-CM | POA: Diagnosis not present

## 2015-09-23 DIAGNOSIS — J45909 Unspecified asthma, uncomplicated: Secondary | ICD-10-CM | POA: Diagnosis not present

## 2015-09-24 ENCOUNTER — Telehealth: Payer: Self-pay | Admitting: Emergency Medicine

## 2015-09-24 ENCOUNTER — Telehealth: Payer: Self-pay | Admitting: *Deleted

## 2015-09-24 ENCOUNTER — Encounter: Payer: Self-pay | Admitting: Family

## 2015-09-24 ENCOUNTER — Ambulatory Visit (HOSPITAL_BASED_OUTPATIENT_CLINIC_OR_DEPARTMENT_OTHER): Payer: Medicare Other | Admitting: Family

## 2015-09-24 ENCOUNTER — Ambulatory Visit: Payer: Medicare Other

## 2015-09-24 ENCOUNTER — Other Ambulatory Visit (HOSPITAL_BASED_OUTPATIENT_CLINIC_OR_DEPARTMENT_OTHER): Payer: Medicare Other

## 2015-09-24 VITALS — BP 138/74 | HR 88 | Temp 98.0°F | Wt 141.1 lb

## 2015-09-24 DIAGNOSIS — Z148 Genetic carrier of other disease: Secondary | ICD-10-CM

## 2015-09-24 DIAGNOSIS — D45 Polycythemia vera: Secondary | ICD-10-CM

## 2015-09-24 DIAGNOSIS — D509 Iron deficiency anemia, unspecified: Secondary | ICD-10-CM

## 2015-09-24 LAB — CBC WITH DIFFERENTIAL (CANCER CENTER ONLY)
BASO#: 0 10*3/uL (ref 0.0–0.2)
BASO%: 0.3 % (ref 0.0–2.0)
EOS%: 1.1 % (ref 0.0–7.0)
Eosinophils Absolute: 0.1 10*3/uL (ref 0.0–0.5)
HCT: 43.5 % (ref 34.8–46.6)
HGB: 14.3 g/dL (ref 11.6–15.9)
LYMPH#: 0.8 10*3/uL — ABNORMAL LOW (ref 0.9–3.3)
LYMPH%: 9.2 % — ABNORMAL LOW (ref 14.0–48.0)
MCH: 30 pg (ref 26.0–34.0)
MCHC: 32.9 g/dL (ref 32.0–36.0)
MCV: 91 fL (ref 81–101)
MONO#: 0.7 10*3/uL (ref 0.1–0.9)
MONO%: 8 % (ref 0.0–13.0)
NEUT#: 7.1 10*3/uL — ABNORMAL HIGH (ref 1.5–6.5)
NEUT%: 81.4 % — ABNORMAL HIGH (ref 39.6–80.0)
Platelets: 194 10*3/uL (ref 145–400)
RBC: 4.77 10*6/uL (ref 3.70–5.32)
RDW: 14.5 % (ref 11.1–15.7)
WBC: 8.8 10*3/uL (ref 3.9–10.0)

## 2015-09-24 LAB — CMP (CANCER CENTER ONLY)
ALT(SGPT): 39 U/L (ref 10–47)
AST: 49 U/L — ABNORMAL HIGH (ref 11–38)
Albumin: 3.2 g/dL — ABNORMAL LOW (ref 3.3–5.5)
Alkaline Phosphatase: 78 U/L (ref 26–84)
BUN, Bld: 10 mg/dL (ref 7–22)
CO2: 29 mEq/L (ref 18–33)
Calcium: 9.1 mg/dL (ref 8.0–10.3)
Chloride: 104 mEq/L (ref 98–108)
Creat: 0.8 mg/dl (ref 0.6–1.2)
Glucose, Bld: 101 mg/dL (ref 73–118)
Potassium: 4 mEq/L (ref 3.3–4.7)
Sodium: 136 mEq/L (ref 128–145)
Total Bilirubin: 1.1 mg/dl (ref 0.20–1.60)
Total Protein: 7.3 g/dL (ref 6.4–8.1)

## 2015-09-24 NOTE — Progress Notes (Signed)
Pt elected to be treated in [facility] at [facility]. Appt to be scheduled there. dph

## 2015-09-24 NOTE — Progress Notes (Signed)
Hematology and Oncology Follow Up Visit  [name] [id number] [birth year] 70 y.o. 09/24/2015   Principle Diagnosis:  1. 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 Phlebotomy to maintain hematocrit below 42%. 2. Aspirin 81 mg p.o. daily.    Interim History:  [name] is here today with a friend for follow-up. She is feeling symptomatic with joint pain, SOB with exertion that is a little worse and occasional palpitations. Her Hct is 43.5 today. She does like to have her phlebotomies at [facility] so her husband can pick her up afterwards. This makes it much easier for her.  No fever, chills, n/v, cough, rash, dizziness, chest pain, abdominal pain or changes in bowel of bladder habits. She has had some diarrhea since starting daily Prilosec. She plans to follow-up with GI to see if this can be adjusted or changed.  She may be having her gallbladder removed in the near future. She has met with Dr. Barry Dienes and discussed this and they will continue watching her for now.  No swelling, numbness or tingling in her extremities.  She is eating well and staying hydrated. Her weight is stable.   Medications:    Medication List       Accurate as of 09/24/15 12:01 PM. Always use your most recent med list.          albuterol 108 (90 Base) MCG/ACT inhaler Commonly known as:  PROAIR HFA Inhale 2 puffs into the lungs every 6 (six) hours as needed for wheezing or shortness of breath.   aspirin 81 MG tablet Take 81 mg by mouth daily.   CLEAR SOLUBLE FIBER Powd Take 5 mLs by mouth every morning.   hydrocortisone valerate cream 0.2 % Commonly known as:  WESTCORT Apply 1 application topically 2 (two) times daily as needed (itching).   L-Carnitine 500 MG Caps Take 2 capsules (1,000 mg total) by mouth daily.   LOPREEZA 0.5-0.1 MG tablet Generic drug:  Estradiol-Norethindrone Acet Take 1 tablet by mouth  daily.   LORazepam 0.5 MG tablet Commonly known as:  ATIVAN TAKE 1 TABLET BY MOUTH EVERY NIGHT AT BEDTIME   Melatonin 3 MG Tabs Take 3 mg by mouth at bedtime.   QVAR 80 MCG/ACT inhaler Generic drug:  beclomethasone INHALE 2 PUFFS INTO THE LUNGS BID   sertraline 50 MG tablet Commonly known as:  ZOLOFT Take 50 mg by mouth every morning.   Vitamin D (Ergocalciferol) 50000 units Caps capsule Commonly known as:  DRISDOL Take 50,000 Units by mouth every Sunday.       Allergies:  Allergies  Allergen Reactions  . Dulera [Mometasone Furo-Formoterol Fum] Cough    Past Medical History, Surgical history, Social history, and Family History were reviewed and updated.  Review of Systems: All other 10 point review of systems is negative.   Physical Exam:  vitals were not taken for this visit.  Wt Readings from Last 3 Encounters:  08/22/15 141 lb (64 kg)  07/16/15 141 lb (64 kg)  06/06/15 144 lb (65.3 kg)    Ocular: Sclerae unicteric, pupils equal, round and reactive to light Ear-nose-throat: Oropharynx clear, dentition fair Lymphatic: No cervical supraclavicular or axillary adenopathy Lungs no rales or rhonchi, good excursion bilaterally Heart regular rate and rhythm, no murmur appreciated Abd soft, nontender, positive bowel sounds, no liver or spleen tip palpated on exam MSK no focal spinal tenderness, no joint edema Neuro: non-focal, well-oriented, appropriate affect Breasts: Deferred  Lab Results  Component Value Date   WBC 8.8 09/24/2015   HGB 14.3 09/24/2015   HCT 43.5 09/24/2015   MCV 91 09/24/2015   PLT 194 09/24/2015   Lab Results  Component Value Date   FERRITIN 29 08/22/2015   IRON 63 08/22/2015   TIBC 359 08/22/2015   UIBC 295 08/22/2015   IRONPCTSAT 18 (L) 08/22/2015   Lab Results  Component Value Date   RETICCTPCT 1.5 06/12/2014   RBC 4.77 09/24/2015   RETICCTABS 65.9 06/12/2014   No results found for: Nils Pyle [facility]  Results  Component Value Date   IGGSERUM 979 02/27/2014   IGA 339 02/27/2014   IGMSERUM 129 02/27/2014   No results found for: Odetta Pink, SPEI   Chemistry      Component Value Date/Time   NA 138 08/22/2015 1308   K 3.9 08/22/2015 1308   CL 100 07/16/2015 1138   CO2 22 08/22/2015 1308   BUN 12.2 08/22/2015 1308   CREATININE 0.7 08/22/2015 1308      Component Value Date/Time   CALCIUM 8.8 08/22/2015 1308   ALKPHOS 98 09/02/2015 1218   ALKPHOS 81 08/22/2015 1308   AST 75 (H) 09/02/2015 1218   AST 53 (H) 08/22/2015 1308   ALT 62 (H) 09/02/2015 1218   ALT 45 08/22/2015 1308   BILITOT 0.9 09/02/2015 1218   BILITOT 0.75 08/22/2015 1308     Impression and Plan: [name] is 70 yo female with both hemochromatosis and polycythemia. She is symptomatic with joint aches, SOB and occasional palpitations at this time. Her Hct is 43.5.  We will get her set up at [facility] this week for phlebotomy and replacement fluids afterwards.  We will then plan to see her back in 6 weeks for repeat labs and follow-up.  She know to contact us with any questions or concerns. We can certainly see her sooner if need be.   Eliezer Bottom, NP 8/29/201712:01 PM

## 2015-09-24 NOTE — Telephone Encounter (Signed)
Collene Gobble, MD  Stark Klein, MD Cc: Randa Spike, CMA        Based on her PFT from 2014, she is high risk for complications of general anesthesia. Not sure that repeat PFT would change our thoughts there. I do believe she needs to be seen here prior to SGY to insure asthma is optimized and not flaring. We will arrange an office visit. Thanks, Rob   Previous Messages    ----- Message -----  From: Stark Klein, MD  Sent: 09/23/2015  8:09 PM  To: Collene Gobble, MD   Dr. Lamonte Sakai,  This lady may end up needing a lap chole. Do you have any perioperative testing or recommendation needs for her asthma?   tx  FB       lmtcb x1 for pt.

## 2015-09-24 NOTE — Telephone Encounter (Signed)
error 

## 2015-09-24 NOTE — Telephone Encounter (Signed)
[phone], pt cb

## 2015-09-24 NOTE — Telephone Encounter (Signed)
Called and spoke to pt. Informed pt that an appt is needed for sx clearance prior to her lap chole. Appt made with RB on 9/5/217. Pt verbalized understanding and denied any further questions or concerns at this time.

## 2015-09-25 LAB — IRON AND TIBC
%SAT: 17 % — ABNORMAL LOW (ref 21–57)
Iron: 62 ug/dL (ref 41–142)
TIBC: 373 ug/dL (ref 236–444)
UIBC: 311 ug/dL (ref 120–384)

## 2015-09-25 LAB — FERRITIN: Ferritin: 30 ng/ml (ref 9–269)

## 2015-09-27 ENCOUNTER — Ambulatory Visit (HOSPITAL_BASED_OUTPATIENT_CLINIC_OR_DEPARTMENT_OTHER): Payer: Medicare Other

## 2015-09-27 VITALS — BP 115/49 | HR 89 | Temp 98.1°F | Resp 18

## 2015-09-27 DIAGNOSIS — D510 Vitamin B12 deficiency anemia due to intrinsic factor deficiency: Secondary | ICD-10-CM

## 2015-09-27 DIAGNOSIS — D45 Polycythemia vera: Secondary | ICD-10-CM

## 2015-09-27 MED ORDER — SODIUM CHLORIDE 0.9 % IV SOLN
INTRAVENOUS | Status: DC
  Administered 2015-09-27: 16:00:00 via INTRAVENOUS

## 2015-09-27 NOTE — Patient Instructions (Signed)
Therapeutic Phlebotomy Therapeutic phlebotomy is the controlled removal of blood from a person's body for the purpose of treating a medical condition. The procedure is similar to donating blood. Usually, about a pint (470 mL, or 0.47L) of blood is removed. The average adult has 9-12 pints (4.3-5.7 L) of blood. Therapeutic phlebotomy may be used to treat the following medical conditions:  Hemochromatosis. This is a condition in which the blood contains too much iron.  Polycythemia vera. This is a condition in which the blood contains too many red blood cells.  Porphyria cutanea tarda. This is a disease in which an important part of hemoglobin is not made properly. It results in the buildup of abnormal amounts of porphyrins in the body.  Sickle cell disease. This is a condition in which the red blood cells form an abnormal crescent shape rather than a round shape. LET [facility] CARE PROVIDER KNOW ABOUT:  Any allergies you have.  All medicines you are taking, including vitamins, herbs, eye drops, creams, and over-the-counter medicines.  Previous problems you or members of your family have had with the use of anesthetics.  Any blood disorders you have.  Previous surgeries you have had.  Any medical conditions you may have. RISKS AND COMPLICATIONS Generally, this is a safe procedure. However, problems may occur, including:  Nausea or light-headedness.  Low blood pressure.  Soreness, bleeding, swelling, or bruising at the needle insertion site.  Infection. BEFORE THE PROCEDURE  Follow instructions from your health care provider about eating or drinking restrictions.  Ask your health care provider about changing or stopping your regular medicines. This is especially important if you are taking diabetes medicines or blood thinners.  Wear clothing with sleeves that can be raised above the elbow.  Plan to have someone take you home after the procedure.  You may have a blood sample  taken. PROCEDURE  A needle will be inserted into one of your veins.  Tubing and a collection bag will be attached to that needle.  Blood will flow through the needle and tubing into the collection bag.  You may be asked to open and close your hand slowly and continually during the entire collection.  After the specified amount of blood has been removed from your body, the collection bag and tubing will be clamped.  The needle will be removed from your vein.  Pressure will be held on the site of the needle insertion to stop the bleeding.  A bandage (dressing) will be placed over the needle insertion site. The procedure may vary among health care providers and hospitals. AFTER THE PROCEDURE  Your recovery will be assessed and monitored.  You can return to your normal activities as directed by your health care provider.   This information is not intended to replace advice given to you by your health care provider. Make sure you discuss any questions you have with your health care provider.   Document Released: 06/16/2010 Document Revised: 05/29/2014 Document Reviewed: 01/08/2014 Elsevier Interactive Patient Education 2016 Else[employer] Inc[employer]   Dehydration, Adult Dehydration is a condition in which you do not have enough fluid or water in your body. It happens when you take in less fluid than you lose. Vital organs such as the kidneys, brain, and heart cannot function without a proper amount of fluids. Any loss of fluids from the body can cause dehydration.  Dehydration can range from mild to severe. This condition should be treated right away to help prevent it from becoming severe. CAUSES  This condition may be caused by:  Vomiting.  Diarrhea.  Excessive sweating, such as when exercising in hot or humid weather.  Not drinking enough fluid during strenuous exercise or during an illness.  Excessive urine output.  Fever.  Certain medicines. RISK FACTORS This condition is more  likely to develop in:  People who are taking certain medicines that cause the body to lose excess fluid (diuretics).   People who have a chronic illness, such as diabetes, that may increase urination.  Older adults.   People who live at high altitudes.   People who participate in endurance sports.  SYMPTOMS  Mild Dehydration  Thirst.  Dry lips.  Slightly dry mouth.  Dry, warm skin. Moderate Dehydration  Very dry mouth.   Muscle cramps.   Dark urine and decreased urine production.   Decreased tear production.   Headache.   Light-headedness, especially when you stand up from a sitting position.  Severe Dehydration  Changes in skin.   Cold and clammy skin.   Skin does not spring back quickly when lightly pinched and released.   Changes in body fluids.   Extreme thirst.   No tears.   Not able to sweat when body temperature is high, such as in hot weather.   Minimal urine production.   Changes in vital signs.   Rapid, weak pulse (more than 100 beats per minute when you are sitting still).   Rapid breathing.   Low blood pressure.   Other changes.   Sunken eyes.   Cold hands and feet.   Confusion.  Lethargy and difficulty being awakened.  Fainting (syncope).   Short-term weight loss.   Unconsciousness. DIAGNOSIS  This condition may be diagnosed based on your symptoms. You may also have tests to determine how severe your dehydration is. These tests may include:   Urine tests.   Blood tests.  TREATMENT  Treatment for this condition depends on the severity. Mild or moderate dehydration can often be treated at home. Treatment should be started right away. Do not wait until dehydration becomes severe. Severe dehydration needs to be treated at the hospital. Treatment for Mild Dehydration  Drinking plenty of water to replace the fluid you have lost.   Replacing minerals in your blood (electrolytes) that you may  have lost.  Treatment for Moderate Dehydration  Consuming oral rehydration solution (ORS). Treatment for Severe Dehydration  Receiving fluid through an IV tube.   Receiving electrolyte solution through a feeding tube that is passed through your nose and into your stomach (nasogastric tube or NG tube).  Correcting any abnormalities in electrolytes. HOME CARE INSTRUCTIONS   Drink enough fluid to keep your urine clear or pale yellow.   Drink water or fluid slowly by taking small sips. You can also try sucking on ice cubes.  Have food or beverages that contain electrolytes. Examples include bananas and sports drinks.  Take over-the-counter and prescription medicines only as told by your health care provider.   Prepare ORS according to the manufacturer's instructions. Take sips of ORS every 5 minutes until your urine returns to normal.  If you have vomiting or diarrhea, continue to try to drink water, ORS, or both.   If you have diarrhea, avoid:   Beverages that contain caffeine.   Fruit juice.   Milk.   Carbonated soft drinks.  Do not take salt tablets. This can lead to the condition of having too much sodium in your body (hypernatremia).  SEEK MEDICAL CARE IF:  You  cannot eat or drink without vomiting.  You have had moderate diarrhea during a period of more than 24 hours.  You have a fever. SEEK IMMEDIATE MEDICAL CARE IF:   You have extreme thirst.  You have severe diarrhea.  You have not urinated in 6-8 hours, or you have urinated only a small amount of very dark urine.  You have shriveled skin.  You are dizzy, confused, or both.   This information is not intended to replace advice given to you by your health care provider. Make sure you discuss any questions you have with your health care provider.   Document Released: 01/12/2005 Document Revised: 10/03/2014 Document Reviewed: 05/30/2014 Elsevier Interactive Patient Education [employer].

## 2015-09-27 NOTE — Progress Notes (Signed)
Therapeutic phlebotomy performed [phone] with 18G in L hand. 502cc removed. Pt asymptomatic and VSS. 500cc bag of NS infused over one hour following phlebotomy. Pt offered hydration and nutrition. Educated about importance of fluids and rest. Pt verablized understanding. AVS printed.

## 2015-10-01 ENCOUNTER — Ambulatory Visit (INDEPENDENT_AMBULATORY_CARE_PROVIDER_SITE_OTHER): Payer: Medicare Other | Admitting: Emergency Medicine

## 2015-10-01 ENCOUNTER — Encounter: Payer: Self-pay | Admitting: Emergency Medicine

## 2015-10-01 DIAGNOSIS — J449 Chronic obstructive pulmonary disease, unspecified: Secondary | ICD-10-CM | POA: Diagnosis not present

## 2015-10-01 NOTE — Progress Notes (Signed)
Subjective:    Patient ID: [name], female    DOB: Sep 18, 1945, 70 y.o.   MRN: [id number]  HPI OV 03/20/14 -- Patient comes in today for follow-up of her known chronic obstructive asthma. He has been maintained on Qvar, and at the last visit we tried Spiriva Respimat. Because of expense, she has been using half dose, and most recently has been having issues with increasing shortness of breath. She has been using her rescue inhaler excessively, but is unsure if this helps. She has not wanted to use LABA's because of side effects. He should also be kept in mind that she has some type of neuromuscular disease which appears to be progressing, wraps this is the cause of her dyspnea. She is scheduled to be evaluated at [facility] in the near future.  ROV 08/21/14 -- follow-up visit for patient who has been managed by Dr. Gwenette Greet for severe chronic obstructive disease with an asthmatic component based on a positive bronchodilator response on temporary function testing from 01/23/13 that I have personally reviewed. On that study she also had restricted lung volumes and a decreased diffusion capacity. 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 She was seen at [facility] re: the myopathy, was defined as mild-to-mod on EMG. Is planning to do some PT.  She is using Spiriva respimat 1 puff qd + QVAR bid. Rarely uses albuterol. She describes significant exertional dyspnea, especially with staircase, lifting.  She denies any cough, no mucous production. She underwent a walking oximetry w Dr [name], did not desaturate.   ROV 02/21/15 -- follow-up for severe COPD with an asthmatic component and a positive bronchodilator response. Also some coinciding restrictive lung disease that may be associated with a mitochondrial myopathy. She has been managed on Spiriva, Qvar, pro-air when necessary. She returns today c/o cough that is new compared with last visit. She has a lot of throat  clearing, seems to be exacerbated by Qvar. She is on Spiriva > causes urinary retention. She has some rhinitis, she is having a lot of reflux since last time.   ROV 04/26/15 -- patient is a history of severe COPD with a positive bronchodilator response on spirometry. I last saw her in January 2017. At her last visit we changed her bronchodilators and her QVAR because she has been having side effects. She has had a lot of throat clearing. She developed more dyspnea and went back on QVAR. She has used albuterol with relief but with tremor as a side effect. She tells me that she fell the other day, hit her R flank.   ROV 10/01/15 -- is a follow-up visit for a patient with a history of severe COPD and an asthmatic component based on spirometry. Her FEV1 in 2014 was 0.88 L Consistent with very severe obstruction. Currently managed only on QVAR. She has been evaluated by Dr. Barry Dienes for cholelithiasis and may need to undergo a cholecystectomy. She is having upper abd pain, nausea. She states that her breathing has been bad since our last visit. She cannot do stairs anymore. She takes proair prn, helps her breathing but has the side effect of tremor.    Review of Systems  Constitutional: Negative for fever and unexpected weight change.  HENT: Positive for congestion, postnasal drip and sinus pressure. Negative for dental problem, ear pain, nosebleeds, rhinorrhea, sneezing, sore throat and trouble swallowing.   Eyes: Negative for redness and itching.  Respiratory: Positive for cough, chest tightness and shortness of breath.  Negative for wheezing.   Cardiovascular: Negative for palpitations and leg swelling.  Gastrointestinal: Negative for nausea and vomiting.  Genitourinary: Negative for dysuria.  Musculoskeletal: Negative for joint swelling.  Skin: Negative for rash.  Neurological: Negative for headaches.  Hematological: Does not bruise/bleed easily.  Psychiatric/Behavioral: Negative for dysphoric mood. The  patient is not nervous/anxious.     Past Medical History:  Diagnosis Date  . Allergy   . Anemia   . Anxiety   . Asthma   . Blood transfusion without reported diagnosis   . Cholelithiasis   . Chronic kidney disease    gallstones   . Heart attack ([place])   . Hemorrhoids   . IBS (irritable bowel syndrome)   . Iliotibial band syndrome    left knee  . Migraine   . Mitochondrial myopathy   . Normal coronary arteries    by cardiac catheterization performed by myself 02/25/01  . Osteoporosis   . Pericarditis    age 1  . Pneumothorax   . 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 PVC's (premature ventricular contractions)   . Vitamin D deficiency      Family History  Problem Relation Age of Onset  . Asthma Mother   . Arthritis Mother   . Depression Mother   . Cancer Father   . Emphysema Father   . Stroke Father   . Alcohol abuse Father   . Heart attack Father   . Colon cancer Neg Hx      Social History   Social History  . Marital status: Married    Spouse name: Richard  . Number of children: 2  . Years of education: Grad   Occupational History  . Psychologist, educational Retired   Social History Main Topics  . Smoking status: Never Smoker  . Smokeless tobacco: Never Used     Comment: never used tobacco  . Alcohol use 1.2 oz/week    2 Glasses of wine per week     Comment: occasionally  . Drug use: No  . Sexual activity: Not on file   Other Topics Concern  . Not on file   Social History Narrative   Health Care POA:    Emergency Contact: husband, Francene Finders, (c) [phone]   End of Life Plan:    Who lives with you: husband   Any pets: none   Diet: Pt has a varied diet.  Eats 5 sm. meals throughout day, focuses on protein, doesn't care for fruits and vegetables very much.   Exercise: Pt has a personal training and exercises several times a week.   Seatbelts: Pt reports wearing seatbelt when in vehicle.   Nancy Fetter Exposure/Protection: Pt reports wearing sun protection.      Hobbies: reading, visiting with friends   Patient has a Scientist, water quality.   Patient has two children.   Patient is retired.   Patient does not drink any caffeine.   Patient is right handed.              Allergies  Allergen Reactions  . Dulera [Mometasone Furo-Formoterol Fum] Cough     Outpatient Medications Prior to Visit  Medication Sig Dispense Refill  . albuterol (PROAIR HFA) 108 (90 Base) MCG/ACT inhaler Inhale 2 puffs into the lungs every 6 (six) hours as needed for wheezing or shortness of breath. 1 Inhaler 5  . aspirin 81 MG tablet Take 81 mg by mouth daily.    . Estradiol-Norethindrone Acet (LOPREEZA) 0.5-0.1 MG per tablet Take 1 tablet by mouth  daily.    . hydrocortisone valerate cream (WESTCORT) 0.2 % Apply 1 application topically 2 (two) times daily as needed (itching).     . LevOCARNitine L-Tartrate (L-CARNITINE) 500 MG CAPS Take 2 capsules (1,000 mg total) by mouth daily. 60 capsule 4  . LORazepam (ATIVAN) 0.5 MG tablet TAKE 1 TABLET BY MOUTH EVERY NIGHT AT BEDTIME 30 tablet 0  . Melatonin 3 MG TABS Take 3 mg by mouth at bedtime.     Marland Kitchen omeprazole (PRILOSEC) 40 MG capsule Take 40 mg by mouth 2 (two) times daily.     Marland Kitchen QVAR 80 MCG/ACT inhaler INHALE 2 PUFFS INTO THE LUNGS BID  5  . sertraline (ZOLOFT) 100 MG tablet Take 100 mg by mouth every morning.  2  . Vitamin D, Ergocalciferol, (DRISDOL) 50000 UNITS CAPS capsule Take 50,000 Units by mouth every Sunday.     . Wheat Dextrin (CLEAR SOLUBLE FIBER) POWD Take 5 mLs by mouth every morning.     . sertraline (ZOLOFT) 50 MG tablet Take 50 mg by mouth every morning.  2   No facility-administered medications prior to visit.          Objective:   Physical Exam Vitals:   10/01/15 1335  BP: 108/60  Pulse: 98  SpO2: 96%  Weight: 141 lb 12.8 oz (64.3 kg)  Height: 5\' 3"  (1.6 m)   Gen: Pleasant, well-nourished, in no distress,  normal affect  ENT: No lesions,  mouth clear,  oropharynx clear, no postnasal drip  Neck:  No JVD, no TMG, no carotid bruits  Lungs: No use of accessory muscles, clear without rales or rhonchi  Cardiovascular: RRR, heart sounds normal, no murmur or gallops, no peripheral edema  Musculoskeletal: No deformities, no cyanosis or clubbing  Neuro: alert, non focal  Skin: Warm, no lesions or rashes       Assessment & Plan:  COPD (chronic obstructive pulmonary disease) (HCC) We discussed possibly starting a long acting bronchodilator medication, but due to side effects we will defer for now Continue your QVAR twice a day  Continue to keep albuterol avail;able to use 2 puffs up to every 4 hours if needed for shortness of breath.  You are at significantly increased risk for complications of surgery due to your lung disease and your mitochondrial disease. You should only pursue surgery if absolutely necessary and if outweighs these risks.  You do not need any prednisone or other meds in preparation for a surgery if we decide to proceed. Follow with Dr Lamonte Sakai in 4 months or sooner if you have any problems.  Baltazar Apo, MD, PhD 10/01/2015, 1:53 PM [facility] [phone] or if no answer [phone]

## 2015-10-01 NOTE — Assessment & Plan Note (Signed)
We discussed possibly starting a long acting bronchodilator medication, but due to side effects we will defer for now Continue your QVAR twice a day  Continue to keep albuterol avail;able to use 2 puffs up to every 4 hours if needed for shortness of breath.  You are at significantly increased risk for complications of surgery due to your lung disease and your mitochondrial disease. You should only pursue surgery if absolutely necessary and if outweighs these risks.  You do not need any prednisone or other meds in preparation for a surgery if we decide to proceed. Follow with Dr Lamonte Sakai in 4 months or sooner if you have any problems.

## 2015-10-01 NOTE — Patient Instructions (Signed)
We discussed possibly starting a long acting bronchodilator medication, but due to side effects we will defer for now Continue your QVAR twice a day  Continue to keep albuterol avail;able to use 2 puffs up to every 4 hours if needed for shortness of breath.  You are at significantly increased risk for complications of surgery due to your lung disease and your mitochondrial disease. You should only pursue surgery if absolutely necessary and if outweighs these risks.  You do not need any prednisone or other meds in preparation for a surgery if we decide to proceed. Follow with Dr Lamonte Sakai in 4 months or sooner if you have any problems.

## 2015-10-18 DIAGNOSIS — R197 Diarrhea, unspecified: Secondary | ICD-10-CM | POA: Diagnosis not present

## 2015-10-18 DIAGNOSIS — K8010 Calculus of gallbladder with chronic cholecystitis without obstruction: Secondary | ICD-10-CM | POA: Diagnosis not present

## 2015-10-18 DIAGNOSIS — J45909 Unspecified asthma, uncomplicated: Secondary | ICD-10-CM | POA: Diagnosis not present

## 2015-10-22 ENCOUNTER — Telehealth: Payer: Self-pay | Admitting: *Deleted

## 2015-10-22 NOTE — Telephone Encounter (Signed)
Patient c/o extreme weakness and fatigue. She states she doesn't feel well and sleeps all the time. She doesn't have any energy and she states she feels like crawling around her home. Her next appointment is 10/12 but she doesn't feel like she can wait that long.   Spoke to Dr Marin Olp who states that patient's appointment can be moved up based on patient availability. Patient aware of plan and message sent to scheduler.

## 2015-10-24 ENCOUNTER — Ambulatory Visit: Payer: Medicare Other | Admitting: Family

## 2015-10-24 ENCOUNTER — Other Ambulatory Visit: Payer: Medicare Other

## 2015-10-25 ENCOUNTER — Ambulatory Visit (HOSPITAL_BASED_OUTPATIENT_CLINIC_OR_DEPARTMENT_OTHER): Payer: Medicare Other

## 2015-10-25 ENCOUNTER — Ambulatory Visit (HOSPITAL_BASED_OUTPATIENT_CLINIC_OR_DEPARTMENT_OTHER): Payer: Medicare Other | Admitting: Family

## 2015-10-25 ENCOUNTER — Other Ambulatory Visit (HOSPITAL_BASED_OUTPATIENT_CLINIC_OR_DEPARTMENT_OTHER): Payer: Medicare Other

## 2015-10-25 ENCOUNTER — Encounter: Payer: Self-pay | Admitting: Family

## 2015-10-25 VITALS — BP 120/62 | HR 91 | Temp 98.0°F | Resp 16 | Ht 63.0 in | Wt 140.1 lb

## 2015-10-25 DIAGNOSIS — D45 Polycythemia vera: Secondary | ICD-10-CM

## 2015-10-25 DIAGNOSIS — D509 Iron deficiency anemia, unspecified: Secondary | ICD-10-CM

## 2015-10-25 DIAGNOSIS — Z23 Encounter for immunization: Secondary | ICD-10-CM | POA: Diagnosis present

## 2015-10-25 DIAGNOSIS — R5383 Other fatigue: Secondary | ICD-10-CM | POA: Diagnosis not present

## 2015-10-25 DIAGNOSIS — R531 Weakness: Secondary | ICD-10-CM

## 2015-10-25 DIAGNOSIS — D510 Vitamin B12 deficiency anemia due to intrinsic factor deficiency: Secondary | ICD-10-CM

## 2015-10-25 LAB — CBC WITH DIFFERENTIAL (CANCER CENTER ONLY)
BASO#: 0 10*3/uL (ref 0.0–0.2)
BASO%: 0.4 % (ref 0.0–2.0)
EOS%: 1.3 % (ref 0.0–7.0)
Eosinophils Absolute: 0.1 10*3/uL (ref 0.0–0.5)
HCT: 37.7 % (ref 34.8–46.6)
HGB: 12.3 g/dL (ref 11.6–15.9)
LYMPH#: 0.8 10*3/uL — ABNORMAL LOW (ref 0.9–3.3)
LYMPH%: 9.8 % — ABNORMAL LOW (ref 14.0–48.0)
MCH: 28.4 pg (ref 26.0–34.0)
MCHC: 32.6 g/dL (ref 32.0–36.0)
MCV: 87 fL (ref 81–101)
MONO#: 0.9 10*3/uL (ref 0.1–0.9)
MONO%: 10.1 % (ref 0.0–13.0)
NEUT#: 6.7 10*3/uL — ABNORMAL HIGH (ref 1.5–6.5)
NEUT%: 78.4 % (ref 39.6–80.0)
Platelets: 189 10*3/uL (ref 145–400)
RBC: 4.33 10*6/uL (ref 3.70–5.32)
RDW: 14.8 % (ref 11.1–15.7)
WBC: 8.5 10*3/uL (ref 3.9–10.0)

## 2015-10-25 MED ORDER — INFLUENZA VAC SPLIT QUAD 0.5 ML IM SUSY
0.5000 mL | PREFILLED_SYRINGE | Freq: Once | INTRAMUSCULAR | Status: AC
  Administered 2015-10-25: 0.5 mL via INTRAMUSCULAR
  Filled 2015-10-25: qty 0.5

## 2015-10-25 NOTE — Progress Notes (Signed)
Hematology and Oncology Follow Up Visit  [name]:[id number] May 02, 1945 70 y.o. 10/25/2015   Principle Diagnosis:  1. 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 Phlebotomy to maintain hematocrit below 42%. 2. Aspirin 81 mg p.o. daily.    Interim History:  [name] is here today with her husband for follow-up. She is feeling quite fatigued and weak. She has not received B 12 in quite a while and feels that her level is low due to her tremor worsening. She used to do monthly self injections and would like to do this again if she is in fact deficient.  I am sure she is also iron deficient due to past phlebotomies and this could certainly contribute to her symptoms. She has received Feraheme in the past. Her hemochromatosis has not been an issue for her so far.  She has had some SOB with exertion and will take a moment to rest as needed. She has also had palpitations off and on.  She does bruise easily but has had no issue with bleeding or petechiae. No lymphadenopathy found on exam.  No fever, chills, ice cravings, n/v, cough, rash, chest pain, abdominal pain or changes in bowel or bladder habits.  She is still having diarrhea on Prilosec. I suggested she get an appointment with GI to discuss other options to treat her GERD. She is still suffering from the same cough at night and is not sleeping well. I gave her Dr. Erlinda Hong contact information.  No swelling, tenderness, numbness or tingling in her extremities.  She has maintained a good appetite but admits that she is not drinking enough fluids.   Medications:    Medication List       Accurate as of 10/25/15  2:31 PM. Always use your most recent med list.          albuterol 108 (90 Base) MCG/ACT inhaler Commonly known as:  PROAIR HFA Inhale 2 puffs into the lungs every 6 (six) hours as needed for wheezing or shortness of breath.     aspirin 81 MG tablet Take 81 mg by mouth daily.   CLEAR SOLUBLE FIBER Powd Take 5 mLs by mouth every morning.   hydrocortisone valerate cream 0.2 % Commonly known as:  WESTCORT Apply 1 application topically 2 (two) times daily as needed (itching).   L-Carnitine 500 MG Caps Take 2 capsules (1,000 mg total) by mouth daily.   LOPREEZA 0.5-0.1 MG tablet Generic drug:  Estradiol-Norethindrone Acet Take 1 tablet by mouth daily.   LORazepam 0.5 MG tablet Commonly known as:  ATIVAN TAKE 1 TABLET BY MOUTH EVERY NIGHT AT BEDTIME   Melatonin 3 MG Tabs Take 3 mg by mouth at bedtime.   omeprazole 40 MG capsule Commonly known as:  PRILOSEC Take 40 mg by mouth 2 (two) times daily.   QVAR 80 MCG/ACT inhaler Generic drug:  beclomethasone INHALE 2 PUFFS INTO THE LUNGS BID   sertraline 100 MG tablet Commonly known as:  ZOLOFT Take 100 mg by mouth every morning.   Vitamin D (Ergocalciferol) 50000 units Caps capsule Commonly known as:  DRISDOL Take 50,000 Units by mouth every Sunday.       Allergies:  Allergies  Allergen Reactions  . Dulera [Mometasone Furo-Formoterol Fum] Cough    Past Medical History, Surgical history, Social history, and Family History were reviewed and updated.  Review of Systems: All other 10 point review of systems is negative.  Physical Exam:  vitals were not taken for this visit.  Wt Readings from Last 3 Encounters:  10/01/15 141 lb 12.8 oz (64.3 kg)  09/24/15 141 lb 1.3 oz (64 kg)  08/22/15 141 lb (64 kg)    Ocular: Sclerae unicteric, pupils equal, round and reactive to light Ear-nose-throat: Oropharynx clear, dentition fair Lymphatic: No cervical supraclavicular or axillary adenopathy Lungs no rales or rhonchi, good excursion bilaterally Heart regular rate and rhythm, no murmur appreciated Abd soft, nontender, positive bowel sounds, no liver or spleen tip palpated on exam MSK no focal spinal tenderness, no joint edema Neuro: non-focal,  well-oriented, appropriate affect Breasts: Deferred  Lab Results  Component Value Date   WBC 8.8 09/24/2015   HGB 14.3 09/24/2015   HCT 43.5 09/24/2015   MCV 91 09/24/2015   PLT 194 09/24/2015   Lab Results  Component Value Date   FERRITIN 30 09/24/2015   IRON 62 09/24/2015   TIBC 373 09/24/2015   UIBC 311 09/24/2015   IRONPCTSAT 17 (L) 09/24/2015   Lab Results  Component Value Date   RETICCTPCT 1.5 06/12/2014   RBC 4.77 09/24/2015   RETICCTABS 65.9 06/12/2014   No results found for: Nils Pyle [facility] Lab Results  Component Value Date   IGGSERUM 979 02/27/2014   IGA 339 02/27/2014   IGMSERUM 129 02/27/2014   No results found for: Odetta Pink, SPEI   Chemistry      Component Value Date/Time   NA 136 09/24/2015 1151   NA 138 08/22/2015 1308   K 4.0 09/24/2015 1151   K 3.9 08/22/2015 1308   CL 104 09/24/2015 1151   CO2 29 09/24/2015 1151   CO2 22 08/22/2015 1308   BUN 10 09/24/2015 1151   BUN 12.2 08/22/2015 1308   CREATININE 0.8 09/24/2015 1151   CREATININE 0.7 08/22/2015 1308      Component Value Date/Time   CALCIUM 9.1 09/24/2015 1151   CALCIUM 8.8 08/22/2015 1308   ALKPHOS 78 09/24/2015 1151   ALKPHOS 81 08/22/2015 1308   AST 49 (H) 09/24/2015 1151   AST 53 (H) 08/22/2015 1308   ALT 39 09/24/2015 1151   ALT 45 08/22/2015 1308   BILITOT 1.10 09/24/2015 1151   BILITOT 0.75 08/22/2015 1308     Impression and Plan: [name] is 70 yo female with both hemochromatosis and polycythemia. She is symptomatic with fatigue, weakness, SOB with exertion and occasional palpitations.  She was phlebotomized on 9/1 for a Hct of 43.5 and she is now 37.7. No phlebotomy needed at this time.  We will see what her iron studies show and bring her in next week for an infusion if needed. So far, hemachromatosis has not been an issue for her and she has had Feraheme in the past.  We will see what her B  12 level is and restart her on monthly injections if needed.  She plans to follow-up with Dr. Paulita Fujita in regards to her GERD and possible medication changes.  We will then plan to see her back in 6 weeks for repeat labs and follow-up.  She know to contact us with any questions or concerns. We can certainly see her sooner if need be.   Eliezer Bottom, NP 9/29/20172:31 PM

## 2015-10-25 NOTE — Patient Instructions (Signed)

## 2015-10-26 LAB — COMPREHENSIVE METABOLIC PANEL (CC13)
ALT: 36 IU/L — ABNORMAL HIGH (ref 0–32)
AST (SGOT): 53 IU/L — ABNORMAL HIGH (ref 0–40)
Albumin, Serum: 3.9 g/dL (ref 3.5–4.8)
Albumin/Globulin Ratio: 1.3 (ref 1.2–2.2)
Alkaline Phosphatase, S: 80 IU/L (ref 39–117)
BUN/Creatinine Ratio: 16 (ref 12–28)
BUN: 11 mg/dL (ref 8–27)
Bilirubin Total: 0.4 mg/dL (ref 0.0–1.2)
Calcium, Ser: 8.8 mg/dL (ref 8.7–10.3)
Carbon Dioxide, Total: 22 mmol/L (ref 18–29)
Chloride, Ser: 102 mmol/L (ref 96–106)
Creatinine, Ser: 0.67 mg/dL (ref 0.57–1.00)
GFR calc Af Amer: 102 mL/min/{1.73_m2} (ref >59)
GFR calc non Af Amer: 88 mL/min/{1.73_m2} (ref >59)
Globulin, Total: 3.1 g/dL (ref 1.5–4.5)
Glucose: 99 mg/dL (ref 65–99)
Potassium, Ser: 3.9 mmol/L (ref 3.5–5.2)
Sodium: 139 mmol/L (ref 134–144)
Total Protein: 7 g/dL (ref 6.0–8.5)

## 2015-10-28 LAB — IRON AND TIBC
%SAT: 7 % — ABNORMAL LOW (ref 21–57)
Iron: 27 ug/dL — ABNORMAL LOW (ref 41–142)
TIBC: 385 ug/dL (ref 236–444)
UIBC: 358 ug/dL (ref 120–384)

## 2015-10-28 LAB — FERRITIN: Ferritin: 23 ng/ml (ref 9–269)

## 2015-10-29 ENCOUNTER — Telehealth: Payer: Self-pay | Admitting: *Deleted

## 2015-10-29 ENCOUNTER — Other Ambulatory Visit: Payer: Self-pay | Admitting: *Deleted

## 2015-10-29 DIAGNOSIS — Z124 Encounter for screening for malignant neoplasm of cervix: Secondary | ICD-10-CM | POA: Diagnosis not present

## 2015-10-29 DIAGNOSIS — D45 Polycythemia vera: Secondary | ICD-10-CM

## 2015-10-29 DIAGNOSIS — Z6826 Body mass index (BMI) 26.0-26.9, adult: Secondary | ICD-10-CM | POA: Diagnosis not present

## 2015-10-29 DIAGNOSIS — R87810 Cervical high risk human papillomavirus (HPV) DNA test positive: Secondary | ICD-10-CM | POA: Diagnosis not present

## 2015-10-29 DIAGNOSIS — Z1151 Encounter for screening for human papillomavirus (HPV): Secondary | ICD-10-CM | POA: Diagnosis not present

## 2015-10-29 DIAGNOSIS — Z01419 Encounter for gynecological examination (general) (routine) without abnormal findings: Secondary | ICD-10-CM | POA: Diagnosis not present

## 2015-10-29 DIAGNOSIS — N959 Unspecified menopausal and perimenopausal disorder: Secondary | ICD-10-CM | POA: Diagnosis not present

## 2015-10-29 DIAGNOSIS — R87610 Atypical squamous cells of undetermined significance on cytologic smear of cervix (ASC-US): Secondary | ICD-10-CM | POA: Diagnosis not present

## 2015-10-29 LAB — VITAMIN B12: Vitamin B12: 344 pg/mL (ref 211–946)

## 2015-10-29 NOTE — Telephone Encounter (Addendum)
Patient aware of results. Message sent to scheduler.   ----- Message from Eliezer Bottom, NP sent at 10/29/2015  1:54 PM EDT ----- Regarding: Iron and B 12 B 12 looks good. No injection needed at this time. Iron is quite low so she will need one dose of Feraheme this week please.   [name]  ----- Message ----- From: Interface, Lab In Three Zero One Sent: 10/25/2015   2:36 PM To: Eliezer Bottom, NP

## 2015-10-30 ENCOUNTER — Other Ambulatory Visit: Payer: Self-pay | Admitting: Family

## 2015-10-30 ENCOUNTER — Ambulatory Visit (HOSPITAL_BASED_OUTPATIENT_CLINIC_OR_DEPARTMENT_OTHER): Payer: Medicare Other

## 2015-10-30 VITALS — BP 123/66 | HR 92 | Temp 98.1°F | Resp 20

## 2015-10-30 DIAGNOSIS — D508 Other iron deficiency anemias: Secondary | ICD-10-CM

## 2015-10-30 DIAGNOSIS — D45 Polycythemia vera: Secondary | ICD-10-CM | POA: Diagnosis not present

## 2015-10-30 DIAGNOSIS — D509 Iron deficiency anemia, unspecified: Secondary | ICD-10-CM | POA: Insufficient documentation

## 2015-10-30 MED ORDER — FAMOTIDINE IN NACL 20-0.9 MG/50ML-% IV SOLN
40.0000 mg | Freq: Two times a day (BID) | INTRAVENOUS | Status: DC
  Administered 2015-10-30: 40 mg via INTRAVENOUS

## 2015-10-30 MED ORDER — SODIUM CHLORIDE 0.9 % IV SOLN
INTRAVENOUS | Status: DC
Start: 2015-10-30 — End: 2015-10-30
  Administered 2015-10-30: 14:00:00 via INTRAVENOUS

## 2015-10-30 MED ORDER — METHYLPREDNISOLONE SODIUM SUCC 125 MG IJ SOLR
INTRAMUSCULAR | Status: AC
  Filled 2015-10-30: qty 2

## 2015-10-30 MED ORDER — METHYLPREDNISOLONE SODIUM SUCC 125 MG IJ SOLR
125.0000 mg | Freq: Once | INTRAMUSCULAR | Status: AC
  Administered 2015-10-30: 125 mg via INTRAVENOUS

## 2015-10-30 MED ORDER — FAMOTIDINE IN NACL 20-0.9 MG/50ML-% IV SOLN
INTRAVENOUS | Status: AC
  Filled 2015-10-30: qty 100

## 2015-10-30 MED ORDER — SODIUM CHLORIDE 0.9 % IV SOLN
510.0000 mg | Freq: Once | INTRAVENOUS | Status: AC
  Administered 2015-10-30: 510 mg via INTRAVENOUS
  Filled 2015-10-30: qty 17

## 2015-10-30 NOTE — Patient Instructions (Signed)

## 2015-10-30 NOTE — Progress Notes (Signed)
1420 Complain of "funny feeling around lips" Iron stopped, NS started, Dr. Marin Olp notified, orders received. 2:52 PM Feeling better, no complaints.

## 2015-10-31 ENCOUNTER — Telehealth: Payer: Self-pay | Admitting: *Deleted

## 2015-10-31 NOTE — Telephone Encounter (Signed)
Patient received iron yesterday. This afternoon she notices her face is bright red. She denies any other symptoms. No issues with swelling, itching or other resp concerns.   Spoke with Judson Roch NP, who doesn't believe the redness to be related to the iron infusion. She suggests the patient try some benadryl OTC to see if that clears up some of the redness.  Patient advised the unlikelihood of this being a infusional reaction, however signs and symptoms of reaction reviewed and she was instructed to be seen in the ED if she developed worsening symptoms or any symptom related to her respiratory system. She understood.

## 2015-11-05 ENCOUNTER — Other Ambulatory Visit: Payer: Medicare Other

## 2015-11-05 ENCOUNTER — Ambulatory Visit: Payer: Medicare Other | Admitting: Family

## 2015-11-07 ENCOUNTER — Other Ambulatory Visit: Payer: Medicare Other

## 2015-11-07 ENCOUNTER — Ambulatory Visit: Payer: Medicare Other | Admitting: Family

## 2015-11-08 DIAGNOSIS — K219 Gastro-esophageal reflux disease without esophagitis: Secondary | ICD-10-CM | POA: Diagnosis not present

## 2015-11-08 DIAGNOSIS — R6889 Other general symptoms and signs: Secondary | ICD-10-CM | POA: Diagnosis not present

## 2015-11-15 ENCOUNTER — Telehealth: Payer: Self-pay | Admitting: Emergency Medicine

## 2015-11-15 NOTE — Telephone Encounter (Signed)
Spoke with the pt  She states that proair is no longer covered and also qvar  The only alternative given for qvar is pulmicort  And she states alternative to proair is albuterol sulfate (I am assuming the ventolin or proventil hfa)  Please advise thanks!

## 2015-11-18 NOTE — Telephone Encounter (Signed)
Ok to do a trial change to Pulmicort 2 puffs bid.  Also OK to change her to formulary version albuterol > Take albuterol 2 puffs up to every 4 hours if needed for shortness of breath.

## 2015-11-18 NOTE — Telephone Encounter (Signed)
Called and spoke with pt and she stated that she is trying to find out where she wants these filled since the [facility] will be closing in November.  She stated to keep this information in her file and she will call back once she has decided.

## 2015-11-29 DIAGNOSIS — E559 Vitamin D deficiency, unspecified: Secondary | ICD-10-CM | POA: Diagnosis not present

## 2015-11-29 DIAGNOSIS — Z79899 Other long term (current) drug therapy: Secondary | ICD-10-CM | POA: Diagnosis not present

## 2015-11-29 DIAGNOSIS — R8299 Other abnormal findings in urine: Secondary | ICD-10-CM | POA: Diagnosis not present

## 2015-11-29 DIAGNOSIS — N390 Urinary tract infection, site not specified: Secondary | ICD-10-CM | POA: Diagnosis not present

## 2015-11-29 DIAGNOSIS — E538 Deficiency of other specified B group vitamins: Secondary | ICD-10-CM | POA: Diagnosis not present

## 2015-12-06 ENCOUNTER — Encounter: Payer: Self-pay | Admitting: Family

## 2015-12-06 ENCOUNTER — Ambulatory Visit (HOSPITAL_BASED_OUTPATIENT_CLINIC_OR_DEPARTMENT_OTHER): Payer: Medicare Other | Admitting: Family

## 2015-12-06 ENCOUNTER — Other Ambulatory Visit (HOSPITAL_BASED_OUTPATIENT_CLINIC_OR_DEPARTMENT_OTHER): Payer: Medicare Other

## 2015-12-06 VITALS — BP 118/68 | HR 88 | Temp 97.9°F | Wt 139.1 lb

## 2015-12-06 DIAGNOSIS — D509 Iron deficiency anemia, unspecified: Secondary | ICD-10-CM

## 2015-12-06 DIAGNOSIS — D45 Polycythemia vera: Secondary | ICD-10-CM

## 2015-12-06 DIAGNOSIS — D510 Vitamin B12 deficiency anemia due to intrinsic factor deficiency: Secondary | ICD-10-CM | POA: Diagnosis not present

## 2015-12-06 DIAGNOSIS — D508 Other iron deficiency anemias: Secondary | ICD-10-CM | POA: Diagnosis not present

## 2015-12-06 DIAGNOSIS — G713 Mitochondrial myopathy, not elsewhere classified: Secondary | ICD-10-CM | POA: Diagnosis not present

## 2015-12-06 DIAGNOSIS — D500 Iron deficiency anemia secondary to blood loss (chronic): Secondary | ICD-10-CM

## 2015-12-06 LAB — COMPREHENSIVE METABOLIC PANEL
ALT: 48 U/L (ref 0–55)
AST: 57 U/L — ABNORMAL HIGH (ref 5–34)
Albumin: 3.4 g/dL — ABNORMAL LOW (ref 3.5–5.0)
Alkaline Phosphatase: 96 U/L (ref 40–150)
Anion Gap: 10 mEq/L (ref 3–11)
BUN: 11.6 mg/dL (ref 7.0–26.0)
CO2: 21 mEq/L — ABNORMAL LOW (ref 22–29)
Calcium: 9.2 mg/dL (ref 8.4–10.4)
Chloride: 106 mEq/L (ref 98–109)
Creatinine: 0.7 mg/dL (ref 0.6–1.1)
EGFR: 83 mL/min/{1.73_m2} — ABNORMAL LOW (ref >90)
Glucose: 100 mg/dl (ref 70–140)
Potassium: 4.2 mEq/L (ref 3.5–5.1)
Sodium: 137 mEq/L (ref 136–145)
Total Bilirubin: 0.79 mg/dL (ref 0.20–1.20)
Total Protein: 7.3 g/dL (ref 6.4–8.3)

## 2015-12-06 LAB — CBC WITH DIFFERENTIAL (CANCER CENTER ONLY)
BASO#: 0 10*3/uL (ref 0.0–0.2)
BASO%: 0.3 % (ref 0.0–2.0)
EOS%: 1 % (ref 0.0–7.0)
Eosinophils Absolute: 0.1 10*3/uL (ref 0.0–0.5)
HCT: 46.2 % (ref 34.8–46.6)
HGB: 15.5 g/dL (ref 11.6–15.9)
LYMPH#: 0.9 10*3/uL (ref 0.9–3.3)
LYMPH%: 8.7 % — ABNORMAL LOW (ref 14.0–48.0)
MCH: 30.8 pg (ref 26.0–34.0)
MCHC: 33.5 g/dL (ref 32.0–36.0)
MCV: 92 fL (ref 81–101)
MONO#: 1 10*3/uL — ABNORMAL HIGH (ref 0.1–0.9)
MONO%: 9.7 % (ref 0.0–13.0)
NEUT#: 8.5 10*3/uL — ABNORMAL HIGH (ref 1.5–6.5)
NEUT%: 80.3 % — ABNORMAL HIGH (ref 39.6–80.0)
Platelets: 176 10*3/uL (ref 145–400)
RBC: 5.03 10*6/uL (ref 3.70–5.32)
RDW: 21.6 % — ABNORMAL HIGH (ref 11.1–15.7)
WBC: 10.6 10*3/uL — ABNORMAL HIGH (ref 3.9–10.0)

## 2015-12-06 NOTE — Progress Notes (Addendum)
Hematology and Oncology Follow Up Visit  [name] [id number] 12-15-1945 70 y.o. 12/06/2015   Principle Diagnosis:  1. 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 Phlebotomy to maintain hematocrit below 45% 2. Aspirin 81 mg p.o. daily.    Interim History:  [name] is here today for follow-up. She states that the iron infusion she received in October really helped her feel much better. Her energy has improved. She still has occasional SOB with exertion but will take time to relax as needed.  She does bruise easily taking 1 baby aspirin daily but has had no issue with bleeding or petechiae. No lymphadenopathy found on exam.  No fever, chills, ice cravings, vomiting, cough, rash, chest pain, abdominal pain or changes in bowel or bladder habits. She is now taking Protonix once at night and this seems to be helping with her nausea.  She has IBS and fluctuates between constipation and diarrhea.  No swelling, tenderness, numbness or tingling in her extremities.  She has maintained a good appetite and is staying hydrated. Her weight is stable.    Medications:    Medication List       Accurate as of 12/06/15  1:58 PM. Always use your most recent med list.          albuterol 108 (90 Base) MCG/ACT inhaler Commonly known as:  PROAIR HFA Inhale 2 puffs into the lungs every 6 (six) hours as needed for wheezing or shortness of breath.   aspirin 81 MG tablet Take 81 mg by mouth daily.   CLEAR SOLUBLE FIBER Powd Take 5 mLs by mouth every morning.   hydrocortisone valerate cream 0.2 % Commonly known as:  WESTCORT Apply 1 application topically 2 (two) times daily as needed (itching).   L-Carnitine 500 MG Caps Take 2 capsules (1,000 mg total) by mouth daily.   LOPREEZA 0.5-0.1 MG tablet Generic drug:  Estradiol-Norethindrone Acet Take 1 tablet by mouth daily.   LORazepam 0.5 MG  tablet Commonly known as:  ATIVAN TAKE 1 TABLET BY MOUTH EVERY NIGHT AT BEDTIME   Melatonin 3 MG Tabs Take 3 mg by mouth at bedtime.   omeprazole 40 MG capsule Commonly known as:  PRILOSEC Take 40 mg by mouth 2 (two) times daily.   QVAR 80 MCG/ACT inhaler Generic drug:  beclomethasone INHALE 2 PUFFS INTO THE LUNGS BID   sertraline 100 MG tablet Commonly known as:  ZOLOFT Take 100 mg by mouth every morning.   Vitamin D (Ergocalciferol) 50000 units Caps capsule Commonly known as:  DRISDOL Take 50,000 Units by mouth every Sunday.       Allergies:  Allergies  Allergen Reactions  . Dulera [Mometasone Furo-Formoterol Fum] Cough    Past Medical History, Surgical history, Social history, and Family History were reviewed and updated.  Review of Systems: All other 10 point review of systems is negative.   Physical Exam:  vitals were not taken for this visit.  Wt Readings from Last 3 Encounters:  10/25/15 140 lb 1.9 oz (63.6 kg)  10/01/15 141 lb 12.8 oz (64.3 kg)  09/24/15 141 lb 1.3 oz (64 kg)    Ocular: Sclerae unicteric, pupils equal, round and reactive to light Ear-nose-throat: Oropharynx clear, dentition fair Lymphatic: No cervical supraclavicular or axillary adenopathy Lungs no rales or rhonchi, good excursion bilaterally Heart regular rate and rhythm, no murmur appreciated Abd soft, nontender, positive bowel sounds, no liver or spleen tip palpated on  exam, no fluid wave MSK no focal spinal tenderness, no joint edema Neuro: non-focal, well-oriented, appropriate affect Breasts: Deferred  Lab Results  Component Value Date   WBC 8.5 10/25/2015   HGB 12.3 10/25/2015   HCT 37.7 10/25/2015   MCV 87 10/25/2015   PLT 189 10/25/2015   Lab Results  Component Value Date   FERRITIN 23 10/25/2015   IRON 27 (L) 10/25/2015   TIBC 385 10/25/2015   UIBC 358 10/25/2015   IRONPCTSAT 7 (L) 10/25/2015   Lab Results  Component Value Date   RETICCTPCT 1.5 06/12/2014    RBC 4.33 10/25/2015   RETICCTABS 65.9 06/12/2014   No results found for: Nils Pyle [facility] Lab Results  Component Value Date   IGGSERUM 979 02/27/2014   IGA 339 02/27/2014   IGMSERUM 129 02/27/2014   No results found for: Odetta Pink, SPEI   Chemistry      Component Value Date/Time   NA 139 10/25/2015 1414   NA 136 09/24/2015 1151   NA 138 08/22/2015 1308   K 3.9 10/25/2015 1414   K 4.0 09/24/2015 1151   K 3.9 08/22/2015 1308   CL 102 10/25/2015 1414   CL 104 09/24/2015 1151   CO2 22 10/25/2015 1414   CO2 29 09/24/2015 1151   CO2 22 08/22/2015 1308   BUN 11 10/25/2015 1414   BUN 10 09/24/2015 1151   BUN 12.2 08/22/2015 1308   CREATININE 0.67 10/25/2015 1414   CREATININE 0.8 09/24/2015 1151   CREATININE 0.7 08/22/2015 1308      Component Value Date/Time   CALCIUM 8.8 10/25/2015 1414   CALCIUM 9.1 09/24/2015 1151   CALCIUM 8.8 08/22/2015 1308   ALKPHOS 80 10/25/2015 1414   ALKPHOS 78 09/24/2015 1151   ALKPHOS 81 08/22/2015 1308   AST 53 (H) 10/25/2015 1414   AST 49 (H) 09/24/2015 1151   AST 53 (H) 08/22/2015 1308   ALT 36 (H) 10/25/2015 1414   ALT 39 09/24/2015 1151   ALT 45 08/22/2015 1308   BILITOT 0.4 10/25/2015 1414   BILITOT 1.10 09/24/2015 1151   BILITOT 0.75 08/22/2015 1308     Impression and Plan: [name] is 70 yo female with both hemochromatosis and polycythemia. She also has iron deficiency due to phlebotomies. She is symptomatic with mild fatigue, SOB with exertion and occasional palpitations.  Her Hct at this time 46.2 so we will schedule her for a phlebotomy next week.  Hemochromatosis has not been an issue for her so far. We will see what her iron studies show and give her Feraheme next week if needed.  We will then plan to see her back in 6 weeks for repeat labs and follow-up.  She knows to contact our office with any questions or concerns. We can certainly see her sooner  if need be.   Eliezer Bottom, NP 11/10/20171:58 PM

## 2015-12-07 LAB — VITAMIN B12: Vitamin B12: 398 pg/mL (ref 211–946)

## 2015-12-09 ENCOUNTER — Other Ambulatory Visit: Payer: Self-pay | Admitting: Family

## 2015-12-09 DIAGNOSIS — E559 Vitamin D deficiency, unspecified: Secondary | ICD-10-CM

## 2015-12-09 LAB — IRON AND TIBC
%SAT: 17 % — ABNORMAL LOW (ref 21–57)
Iron: 54 ug/dL (ref 41–142)
TIBC: 318 ug/dL (ref 236–444)
UIBC: 264 ug/dL (ref 120–384)

## 2015-12-09 LAB — FERRITIN: Ferritin: 72 ng/ml (ref 9–269)

## 2015-12-10 ENCOUNTER — Telehealth: Payer: Self-pay | Admitting: *Deleted

## 2015-12-10 DIAGNOSIS — F331 Major depressive disorder, recurrent, moderate: Secondary | ICD-10-CM | POA: Diagnosis not present

## 2015-12-10 DIAGNOSIS — Z6824 Body mass index (BMI) 24.0-24.9, adult: Secondary | ICD-10-CM | POA: Diagnosis not present

## 2015-12-10 DIAGNOSIS — R05 Cough: Secondary | ICD-10-CM | POA: Diagnosis not present

## 2015-12-10 DIAGNOSIS — E538 Deficiency of other specified B group vitamins: Secondary | ICD-10-CM | POA: Diagnosis not present

## 2015-12-10 DIAGNOSIS — M810 Age-related osteoporosis without current pathological fracture: Secondary | ICD-10-CM | POA: Diagnosis not present

## 2015-12-10 DIAGNOSIS — D751 Secondary polycythemia: Secondary | ICD-10-CM | POA: Diagnosis not present

## 2015-12-10 DIAGNOSIS — Z1389 Encounter for screening for other disorder: Secondary | ICD-10-CM | POA: Diagnosis not present

## 2015-12-10 DIAGNOSIS — J449 Chronic obstructive pulmonary disease, unspecified: Secondary | ICD-10-CM | POA: Diagnosis not present

## 2015-12-10 DIAGNOSIS — D509 Iron deficiency anemia, unspecified: Secondary | ICD-10-CM | POA: Diagnosis not present

## 2015-12-10 DIAGNOSIS — Z0000 Encounter for general adult medical examination without abnormal findings: Secondary | ICD-10-CM | POA: Diagnosis not present

## 2015-12-10 NOTE — Telephone Encounter (Addendum)
Patient aware of results  ----- Message from Eliezer Bottom, NP sent at 12/09/2015  3:50 PM EST ----- Regarding: Iron studies better   ----- Message ----- From: Interface, Lab In Three Zero One Sent: 12/06/2015   2:00 PM To: Eliezer Bottom, NP

## 2015-12-11 ENCOUNTER — Ambulatory Visit (HOSPITAL_BASED_OUTPATIENT_CLINIC_OR_DEPARTMENT_OTHER): Payer: Medicare Other

## 2015-12-11 ENCOUNTER — Other Ambulatory Visit: Payer: Self-pay | Admitting: Family

## 2015-12-11 VITALS — BP 132/70 | HR 88 | Temp 97.6°F | Resp 17

## 2015-12-11 DIAGNOSIS — D45 Polycythemia vera: Secondary | ICD-10-CM | POA: Diagnosis present

## 2015-12-11 DIAGNOSIS — D508 Other iron deficiency anemias: Secondary | ICD-10-CM

## 2015-12-11 MED ORDER — SODIUM CHLORIDE 0.9 % IV SOLN
Freq: Once | INTRAVENOUS | Status: AC
  Administered 2015-12-11: 16:00:00 via INTRAVENOUS

## 2015-12-11 NOTE — Patient Instructions (Signed)
Therapeutic Phlebotomy Therapeutic phlebotomy is the controlled removal of blood from a person's body for the purpose of treating a medical condition. The procedure is similar to donating blood. Usually, about a pint (470 mL, or 0.47L) of blood is removed. The average adult has 9-12 pints (4.3-5.7 L) of blood. Therapeutic phlebotomy may be used to treat the following medical conditions:  Hemochromatosis. This is a condition in which the blood contains too much iron.  Polycythemia vera. This is a condition in which the blood contains too many red blood cells.  Porphyria cutanea tarda. This is a disease in which an important part of hemoglobin is not made properly. It results in the buildup of abnormal amounts of porphyrins in the body.  Sickle cell disease. This is a condition in which the red blood cells form an abnormal crescent shape rather than a round shape. Tell a health care provider about:  Any allergies you have.  All medicines you are taking, including vitamins, herbs, eye drops, creams, and over-the-counter medicines.  Any problems you or family members have had with anesthetic medicines.  Any blood disorders you have.  Any surgeries you have had.  Any medical conditions you have. What are the risks? Generally, this is a safe procedure. However, problems may occur, including:  Nausea or light-headedness.  Low blood pressure.  Soreness, bleeding, swelling, or bruising at the needle insertion site.  Infection. What happens before the procedure?  Follow instructions from your health care provider about eating or drinking restrictions.  Ask your health care provider about changing or stopping your regular medicines. This is especially important if you are taking diabetes medicines or blood thinners.  Wear clothing with sleeves that can be raised above the elbow.  Plan to have someone take you home after the procedure.  You may have a blood sample taken. What happens  during the procedure?  A needle will be inserted into one of your veins.  Tubing and a collection bag will be attached to that needle.  Blood will flow through the needle and tubing into the collection bag.  You may be asked to open and close your hand slowly and continually during the entire collection.  After the specified amount of blood has been removed from your body, the collection bag and tubing will be clamped.  The needle will be removed from your vein.  Pressure will be held on the site of the needle insertion to stop the bleeding.  A bandage (dressing) will be placed over the needle insertion site. The procedure may vary among health care providers and hospitals. What happens after the procedure?  Your recovery will be assessed and monitored.  You can return to your normal activities as directed by your health care provider. This information is not intended to replace advice given to you by your health care provider. Make sure you discuss any questions you have with your health care provider. Document Released: 06/16/2010 Document Revised: 09/14/2015 Document Reviewed: 01/08/2014 Elsevier Interactive Patient Education  2017 [employer] Dehydration, Adult Dehydration is a condition in which there is not enough fluid or water in the body. This happens when you lose more fluids than you take in. Important organs, such as the kidneys, brain, and heart, cannot function without a proper amount of fluids. Any loss of fluids from the body can lead to dehydration. Dehydration can range from mild to severe. This condition should be treated right away to prevent it from becoming severe. What are the causes? This   condition may be caused by:  Vomiting.  Diarrhea.  Excessive sweating, such as from heat exposure or exercise.  Not drinking enough fluid, especially:  When ill.  While doing activity that requires a lot of energy.  Excessive  urination.  Fever.  Infection.  Certain medicines, such as medicines that cause the body to lose excess fluid (diuretics).  Inability to access safe drinking water.  Reduced physical ability to get adequate water and food. What increases the risk? This condition is more likely to develop in people:  Who have a poorly controlled long-term (chronic) illness, such as diabetes, heart disease, or kidney disease.  Who are age 65 or older.  Who are disabled.  Who live in a place with high altitude.  Who play endurance sports. What are the signs or symptoms? Symptoms of mild dehydration may include:   Thirst.  Dry lips.  Slightly dry mouth.  Dry, warm skin.  Dizziness. Symptoms of moderate dehydration may include:   Very dry mouth.  Muscle cramps.  Dark urine. Urine may be the color of tea.  Decreased urine production.  Decreased tear production.  Heartbeat that is irregular or faster than normal (palpitations).  Headache.  Light-headedness, especially when you stand up from a sitting position.  Fainting (syncope). Symptoms of severe dehydration may include:   Changes in skin, such as:  Cold and clammy skin.  Blotchy (mottled) or pale skin.  Skin that does not quickly return to normal after being lightly pinched and released (poor skin turgor).  Changes in body fluids, such as:  Extreme thirst.  No tear production.  Inability to sweat when body temperature is high, such as in hot weather.  Very little urine production.  Changes in vital signs, such as:  Weak pulse.  Pulse that is more than 100 beats a minute when sitting still.  Rapid breathing.  Low blood pressure.  Other changes, such as:  Sunken eyes.  Cold hands and feet.  Confusion.  Lack of energy (lethargy).  Difficulty waking up from sleep.  Short-term weight loss.  Unconsciousness. How is this diagnosed? This condition is diagnosed based on your symptoms and a  physical exam. Blood and urine tests may be done to help confirm the diagnosis. How is this treated? Treatment for this condition depends on the severity. Mild or moderate dehydration can often be treated at home. Treatment should be started right away. Do not wait until dehydration becomes severe. Severe dehydration is an emergency and it needs to be treated in a hospital. Treatment for mild dehydration may include:   Drinking more fluids.  Replacing salts and minerals in your blood (electrolytes) that you may have lost. Treatment for moderate dehydration may include:   Drinking an oral rehydration solution (ORS). This is a drink that helps you replace fluids and electrolytes (rehydrate). It can be found at pharmacies and retail stores. Treatment for severe dehydration may include:   Receiving fluids through an IV tube.  Receiving an electrolyte solution through a feeding tube that is passed through your nose and into your stomach (nasogastric tube, or NG tube).  Correcting any abnormalities in electrolytes.  Treating the underlying cause of dehydration. Follow these instructions at home:  If directed by your health care provider, drink an ORS:  Make an ORS by following instructions on the package.  Start by drinking small amounts, about  cup (120 mL) every 5-10 minutes.  Slowly increase how much you drink until you have taken the amount recommended by   your health care provider.  Drink enough clear fluid to keep your urine clear or pale yellow. If you were told to drink an ORS, finish the ORS first, then start slowly drinking other clear fluids. Drink fluids such as:  Water. Do not drink only water. Doing that can lead to having too little salt (sodium) in the body (hyponatremia).  Ice chips.  Fruit juice that you have added water to (diluted fruit juice).  Low-calorie sports drinks.  Avoid:  Alcohol.  Drinks that contain a lot of sugar. These include high-calorie sports  drinks, fruit juice that is not diluted, and soda.  Caffeine.  Foods that are greasy or contain a lot of fat or sugar.  Take over-the-counter and prescription medicines only as told by your health care provider.  Do not take sodium tablets. This can lead to having too much sodium in the body (hypernatremia).  Eat foods that contain a healthy balance of electrolytes, such as bananas, oranges, potatoes, tomatoes, and spinach.  Keep all follow-up visits as told by your health care provider. This is important. Contact a health care provider if:  You have abdominal pain that:  Gets worse.  Stays in one area (localizes).  You have a rash.  You have a stiff neck.  You are more irritable than usual.  You are sleepier or more difficult to wake up than usual.  You feel weak or dizzy.  You feel very thirsty.  You have urinated only a small amount of very dark urine over 6-8 hours. Get help right away if:  You have symptoms of severe dehydration.  You cannot drink fluids without vomiting.  Your symptoms get worse with treatment.  You have a fever.  You have a severe headache.  You have vomiting or diarrhea that:  Gets worse.  Does not go away.  You have blood or green matter (bile) in your vomit.  You have blood in your stool. This may cause stool to look black and tarry.  You have not urinated in 6-8 hours.  You faint.  Your heart rate while sitting still is over 100 beats a minute.  You have trouble breathing. This information is not intended to replace advice given to you by your health care provider. Make sure you discuss any questions you have with your health care provider. Document Released: 01/12/2005 Document Revised: 08/09/2015 Document Reviewed: 03/08/2015 Elsevier Interactive Patient Education  2017 Elsevier Inc.  

## 2015-12-11 NOTE — Progress Notes (Signed)
18G catheter placed in L hand and 11mL removed from 1510-1530. Additional 429mL removed from L AC with 16G needle attached to phlebotomy set from 1530-1540. 580mL NS administered post infusion for rehydration. Fluids and nutrition offered to pt.

## 2015-12-12 ENCOUNTER — Telehealth: Payer: Self-pay | Admitting: Emergency Medicine

## 2015-12-12 MED ORDER — BECLOMETHASONE DIPROPIONATE 80 MCG/ACT IN AERS: 2.0000 | INHALATION_SPRAY | Freq: Two times a day (BID) | RESPIRATORY_TRACT | 2 refills | Status: DC

## 2015-12-12 NOTE — Telephone Encounter (Signed)
Spoke with pt who states she would like her Rx sent to [facility] on [address], as her current pharmacy is closing today. Rx has been sent to preferred pharmacy. I have called [facility] on [address] and made them aware of this.  Pt aware and voiced understanding. Nothing further needed.

## 2015-12-18 DIAGNOSIS — Z1212 Encounter for screening for malignant neoplasm of rectum: Secondary | ICD-10-CM | POA: Diagnosis not present

## 2016-01-01 DIAGNOSIS — H2513 Age-related nuclear cataract, bilateral: Secondary | ICD-10-CM | POA: Diagnosis not present

## 2016-01-10 ENCOUNTER — Telehealth: Payer: Self-pay | Admitting: Emergency Medicine

## 2016-01-10 NOTE — Telephone Encounter (Signed)
Called and spoke with pt and she stated that she has to change her insurance---she stated that she will find out what her insurance will cover and she will call back on Monday to let us know. She stated that they will not cover the proair or qvar.

## 2016-01-15 MED ORDER — BUDESONIDE 90 MCG/ACT IN AEPB: 2.0000 | INHALATION_SPRAY | Freq: Two times a day (BID) | RESPIRATORY_TRACT | 4 refills | Status: DC

## 2016-01-15 MED ORDER — ALBUTEROL SULFATE HFA 108 (90 BASE) MCG/ACT IN AERS: 2.0000 | INHALATION_SPRAY | RESPIRATORY_TRACT | 4 refills | Status: DC | PRN

## 2016-01-15 NOTE — Telephone Encounter (Signed)
Patient stated her insurance will cover ventolin and Pulmicort. [phone]. No need to call.

## 2016-01-15 NOTE — Telephone Encounter (Signed)
RB pt called [facility] and stated that her insurance will cover ventolin and pulmicort.  Please advise of which medication you want the pt to have.  Thanks  Allergies  Allergen Reactions  . Dulera [Mometasone Furo-Formoterol Fum] Cough

## 2016-01-15 NOTE — Telephone Encounter (Signed)
Spoke with patient and informed her of Dr. Agustina Caroli info. Patient verbalized understanding. Meds were sent to her pharmacy.

## 2016-01-15 NOTE — Telephone Encounter (Signed)
OK to order ventolin q4h prn  Ok to order Pulmicort 79mcg, 2 puffs bid.

## 2016-01-15 NOTE — Telephone Encounter (Signed)
Spoke with pt. States that she called her insurance company but she could not locate the paper she wrote the information on. Pt will call us back once she locates this paper. Will await her call back.

## 2016-01-17 ENCOUNTER — Ambulatory Visit (HOSPITAL_BASED_OUTPATIENT_CLINIC_OR_DEPARTMENT_OTHER): Payer: Medicare Other | Admitting: Family

## 2016-01-17 ENCOUNTER — Other Ambulatory Visit (HOSPITAL_BASED_OUTPATIENT_CLINIC_OR_DEPARTMENT_OTHER): Payer: Medicare Other

## 2016-01-17 VITALS — BP 114/65 | HR 90 | Temp 98.0°F | Resp 16 | Wt 137.0 lb

## 2016-01-17 DIAGNOSIS — D45 Polycythemia vera: Secondary | ICD-10-CM

## 2016-01-17 DIAGNOSIS — G713 Mitochondrial myopathy, not elsewhere classified: Secondary | ICD-10-CM | POA: Diagnosis not present

## 2016-01-17 DIAGNOSIS — D500 Iron deficiency anemia secondary to blood loss (chronic): Secondary | ICD-10-CM

## 2016-01-17 DIAGNOSIS — E559 Vitamin D deficiency, unspecified: Secondary | ICD-10-CM | POA: Diagnosis not present

## 2016-01-17 LAB — CBC WITH DIFFERENTIAL (CANCER CENTER ONLY)
BASO#: 0 10*3/uL (ref 0.0–0.2)
BASO%: 0.2 % (ref 0.0–2.0)
EOS%: 1 % (ref 0.0–7.0)
Eosinophils Absolute: 0.1 10*3/uL (ref 0.0–0.5)
HCT: 42.4 % (ref 34.8–46.6)
HGB: 14.2 g/dL (ref 11.6–15.9)
LYMPH#: 0.8 10*3/uL — ABNORMAL LOW (ref 0.9–3.3)
LYMPH%: 10.1 % — ABNORMAL LOW (ref 14.0–48.0)
MCH: 31.1 pg (ref 26.0–34.0)
MCHC: 33.5 g/dL (ref 32.0–36.0)
MCV: 93 fL (ref 81–101)
MONO#: 0.8 10*3/uL (ref 0.1–0.9)
MONO%: 9.6 % (ref 0.0–13.0)
NEUT#: 6.3 10*3/uL (ref 1.5–6.5)
NEUT%: 79.1 % (ref 39.6–80.0)
Platelets: 186 10*3/uL (ref 145–400)
RBC: 4.57 10*6/uL (ref 3.70–5.32)
RDW: 16.9 % — ABNORMAL HIGH (ref 11.1–15.7)
WBC: 8 10*3/uL (ref 3.9–10.0)

## 2016-01-17 LAB — COMPREHENSIVE METABOLIC PANEL
ALT: 38 U/L (ref 0–55)
AST: 48 U/L — ABNORMAL HIGH (ref 5–34)
Albumin: 3.5 g/dL (ref 3.5–5.0)
Alkaline Phosphatase: 87 U/L (ref 40–150)
Anion Gap: 9 mEq/L (ref 3–11)
BUN: 12.3 mg/dL (ref 7.0–26.0)
CO2: 25 mEq/L (ref 22–29)
Calcium: 9.1 mg/dL (ref 8.4–10.4)
Chloride: 104 mEq/L (ref 98–109)
Creatinine: 0.8 mg/dL (ref 0.6–1.1)
EGFR: 75 mL/min/{1.73_m2} — ABNORMAL LOW (ref >90)
Glucose: 118 mg/dl (ref 70–140)
Potassium: 4.1 mEq/L (ref 3.5–5.1)
Sodium: 138 mEq/L (ref 136–145)
Total Bilirubin: 0.67 mg/dL (ref 0.20–1.20)
Total Protein: 7 g/dL (ref 6.4–8.3)

## 2016-01-17 NOTE — Progress Notes (Signed)
Hematology and Oncology Follow Up Visit  [name] [id number] 04/18/1945 70 y.o. 01/17/2016   Principle Diagnosis:  1. 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 Phlebotomy to maintain hematocrit below 45% 2. Aspirin 81 mg p.o. Daily. 3. IV iron as indicated     Interim History:  [name] is here today for follow-up. She is doing well but still having some fatigue at times. She has had a few episodes of palpitations that come and go. She has experienced iron deficiency secondary to therapeutic blood loss with phlebotomies. She does bruise easily taking 1 baby aspirin daily but has had no issue with bleeding or petechiae. No lymphadenopathy found on exam.  No fever, chills, ice cravings, vomiting, cough, rash, chest pain, abdominal pain or changes in bowel or bladder habits.  She has IBS and fluctuates between constipation and diarrhea.  No swelling, tenderness, numbness or tingling in her extremities. No c/o pain.  She has maintained a good appetite and is staying hydrated. She is trying to eat healthier in an attempt to lose some weight. Her weight is stable.    Medications:  Allergies as of 01/17/2016      Reactions   Dulera [mometasone Furo-formoterol Fum] Cough      Medication List       Accurate as of 01/17/16  2:06 PM. Always use your most recent med list.          albuterol 108 (90 Base) MCG/ACT inhaler Commonly known as:  PROAIR HFA Inhale 2 puffs into the lungs every 6 (six) hours as needed for wheezing or shortness of breath.   albuterol 108 (90 Base) MCG/ACT inhaler Commonly known as:  PROVENTIL HFA;VENTOLIN HFA Inhale 2 puffs into the lungs every 4 (four) hours as needed for wheezing or shortness of breath.   aspirin 81 MG tablet Take 81 mg by mouth daily.   Budesonide 90 MCG/ACT  inhaler Inhale 2 puffs into the lungs 2 (two) times daily.   CLEAR SOLUBLE FIBER Powd Take 5 mLs by mouth every morning.   hydrocortisone valerate cream 0.2 % Commonly known as:  WESTCORT Apply 1 application topically 2 (two) times daily as needed (itching).   L-Carnitine 500 MG Caps Take 2 capsules (1,000 mg total) by mouth daily.   LOPREEZA 0.5-0.1 MG tablet Generic drug:  Estradiol-Norethindrone Acet Take 1 tablet by mouth daily.   LORazepam 0.5 MG tablet Commonly known as:  ATIVAN TAKE 1 TABLET BY MOUTH EVERY NIGHT AT BEDTIME   Melatonin 3 MG Tabs Take 3 mg by mouth at bedtime.   omeprazole 40 MG capsule Commonly known as:  PRILOSEC Take 40 mg by mouth 2 (two) times daily.   pantoprazole 40 MG tablet Commonly known as:  PROTONIX Take 40 mg by mouth daily at 8 pm.   QVAR 80 MCG/ACT inhaler Generic drug:  beclomethasone INHALE 2 PUFFS INTO THE LUNGS BID   beclomethasone 80 MCG/ACT inhaler Commonly known as:  QVAR Inhale 2 puffs into the lungs 2 (two) times daily.   sertraline 100 MG tablet Commonly known as:  ZOLOFT Take 100 mg by mouth every morning.   Vitamin D (Ergocalciferol) 50000 units Caps capsule Commonly known as:  DRISDOL Take 50,000 Units by mouth every Sunday.       Allergies:  Allergies  Allergen Reactions  . Dulera [Mometasone Furo-Formoterol Fum] Cough  Past Medical History, Surgical history, Social history, and Family History were reviewed and updated.  Review of Systems: All other 10 point review of systems is negative.   Physical Exam:  vitals were not taken for this visit.  Wt Readings from Last 3 Encounters:  12/06/15 139 lb 1.9 oz (63.1 kg)  10/25/15 140 lb 1.9 oz (63.6 kg)  10/01/15 141 lb 12.8 oz (64.3 kg)    Ocular: Sclerae unicteric, pupils equal, round and reactive to light Ear-nose-throat: Oropharynx clear, dentition fair Lymphatic: No cervical supraclavicular or axillary adenopathy Lungs no rales or rhonchi,  good excursion bilaterally Heart regular rate and rhythm, no murmur appreciated Abd soft, nontender, positive bowel sounds, no liver or spleen tip palpated on exam, no fluid wave MSK no focal spinal tenderness, no joint edema Neuro: non-focal, well-oriented, appropriate affect Breasts: Deferred  Lab Results  Component Value Date   WBC 10.6 (H) 12/06/2015   HGB 15.5 12/06/2015   HCT 46.2 12/06/2015   MCV 92 12/06/2015   PLT 176 12/06/2015   Lab Results  Component Value Date   FERRITIN 72 12/06/2015   IRON 54 12/06/2015   TIBC 318 12/06/2015   UIBC 264 12/06/2015   IRONPCTSAT 17 (L) 12/06/2015   Lab Results  Component Value Date   RETICCTPCT 1.5 06/12/2014   RBC 5.03 12/06/2015   RETICCTABS 65.9 06/12/2014   No results found for: Nils Pyle [facility] Lab Results  Component Value Date   IGGSERUM 979 02/27/2014   IGA 339 02/27/2014   IGMSERUM 129 02/27/2014   No results found for: Odetta Pink, SPEI   Chemistry      Component Value Date/Time   NA 137 12/06/2015 1347   K 4.2 12/06/2015 1347   CL 102 10/25/2015 1414   CL 104 09/24/2015 1151   CO2 21 (L) 12/06/2015 1347   BUN 11.6 12/06/2015 1347   CREATININE 0.7 12/06/2015 1347      Component Value Date/Time   CALCIUM 9.2 12/06/2015 1347   ALKPHOS 96 12/06/2015 1347   AST 57 (H) 12/06/2015 1347   ALT 48 12/06/2015 1347   BILITOT 0.79 12/06/2015 1347     Impression and Plan: [name] is 70 yo female with both hemochromatosis and polycythemia. She also has iron deficiency due to phlebotomies. Her Hct is 42.4 today so she will not need to be phlebotomized. She is still having fatigue and occasional palpitations that resolve without intervention.  Hemochromatosis has not been an issue for her so far. We will see what her iron studies show and give her Feraheme next week if needed.  We will see her back in 6 weeks for repeat labs and follow-up.   She will contact our office with any questions or concerns. We can certainly see her sooner if need be.   Faith Bottom, NP 12/22/20172:06 PM

## 2016-01-18 LAB — VITAMIN D 25 HYDROXY (VIT D DEFICIENCY, FRACTURES): Vitamin D, 25-Hydroxy: 47.1 ng/mL (ref 30.0–100.0)

## 2016-01-21 ENCOUNTER — Telehealth: Payer: Self-pay | Admitting: *Deleted

## 2016-01-21 LAB — FERRITIN: Ferritin: 27 ng/ml (ref 9–269)

## 2016-01-21 LAB — IRON AND TIBC
%SAT: 11 % — ABNORMAL LOW (ref 21–57)
Iron: 37 ug/dL — ABNORMAL LOW (ref 41–142)
TIBC: 340 ug/dL (ref 236–444)
UIBC: 303 ug/dL (ref 120–384)

## 2016-01-21 NOTE — Telephone Encounter (Addendum)
Patient aware of results. Transferred to scheduler to make appointments  ----- Message from Eliezer Bottom, NP sent at 01/21/2016 10:06 AM EST ----- Regarding: Iron  Needs 2 doses of Feraheme with the first this week. Thank you!  [name]  ----- Message ----- From: Interface, Lab In Three Zero One Sent: 01/17/2016   2:10 PM To: Eliezer Bottom, NP

## 2016-01-23 ENCOUNTER — Ambulatory Visit (HOSPITAL_BASED_OUTPATIENT_CLINIC_OR_DEPARTMENT_OTHER): Payer: Medicare Other

## 2016-01-23 VITALS — BP 120/56 | HR 91 | Temp 98.1°F | Resp 16

## 2016-01-23 DIAGNOSIS — D508 Other iron deficiency anemias: Secondary | ICD-10-CM | POA: Diagnosis present

## 2016-01-23 MED ORDER — FAMOTIDINE IN NACL 20-0.9 MG/50ML-% IV SOLN
INTRAVENOUS | Status: AC
  Filled 2016-01-23: qty 50

## 2016-01-23 MED ORDER — METHYLPREDNISOLONE SODIUM SUCC 125 MG IJ SOLR
INTRAMUSCULAR | Status: AC
  Filled 2016-01-23: qty 2

## 2016-01-23 MED ORDER — FAMOTIDINE IN NACL 20-0.9 MG/50ML-% IV SOLN
40.0000 mg | Freq: Two times a day (BID) | INTRAVENOUS | Status: DC
  Administered 2016-01-23: 40 mg via INTRAVENOUS

## 2016-01-23 MED ORDER — METHYLPREDNISOLONE SODIUM SUCC 125 MG IJ SOLR
125.0000 mg | Freq: Once | INTRAMUSCULAR | Status: AC
  Administered 2016-01-23: 125 mg via INTRAVENOUS

## 2016-01-23 MED ORDER — SODIUM CHLORIDE 0.9 % IV SOLN
510.0000 mg | Freq: Once | INTRAVENOUS | Status: AC
  Administered 2016-01-23: 510 mg via INTRAVENOUS
  Filled 2016-01-23: qty 17

## 2016-01-30 ENCOUNTER — Ambulatory Visit: Payer: Medicare Other | Admitting: Emergency Medicine

## 2016-01-31 ENCOUNTER — Ambulatory Visit (HOSPITAL_BASED_OUTPATIENT_CLINIC_OR_DEPARTMENT_OTHER): Payer: Medicare Other

## 2016-01-31 VITALS — BP 126/64 | HR 87 | Temp 97.6°F | Resp 17

## 2016-01-31 DIAGNOSIS — D508 Other iron deficiency anemias: Secondary | ICD-10-CM

## 2016-01-31 MED ORDER — FAMOTIDINE IN NACL 20-0.9 MG/50ML-% IV SOLN
40.0000 mg | Freq: Two times a day (BID) | INTRAVENOUS | Status: DC
  Administered 2016-01-31: 40 mg via INTRAVENOUS

## 2016-01-31 MED ORDER — SODIUM CHLORIDE 0.9 % IV SOLN
510.0000 mg | Freq: Once | INTRAVENOUS | Status: AC
  Administered 2016-01-31: 510 mg via INTRAVENOUS
  Filled 2016-01-31: qty 17

## 2016-01-31 MED ORDER — METHYLPREDNISOLONE SODIUM SUCC 125 MG IJ SOLR
125.0000 mg | Freq: Once | INTRAMUSCULAR | Status: AC
  Administered 2016-01-31: 125 mg via INTRAVENOUS

## 2016-01-31 MED ORDER — FAMOTIDINE IN NACL 20-0.9 MG/50ML-% IV SOLN
INTRAVENOUS | Status: AC
  Filled 2016-01-31: qty 100

## 2016-01-31 MED ORDER — METHYLPREDNISOLONE SODIUM SUCC 125 MG IJ SOLR
INTRAMUSCULAR | Status: AC
  Filled 2016-01-31: qty 2

## 2016-02-03 ENCOUNTER — Telehealth: Payer: Self-pay | Admitting: Emergency Medicine

## 2016-02-03 NOTE — Telephone Encounter (Signed)
Received a PA for Pulmicort 17mcg on 01/15/2016. PA was started on 01/29/2016 through covermymeds. Received a response today that the patient does not need a PA for medication. I called [facility] on [address] and informed the pharmacist. Nothing else was needed.

## 2016-02-04 ENCOUNTER — Ambulatory Visit (INDEPENDENT_AMBULATORY_CARE_PROVIDER_SITE_OTHER): Payer: Medicare Other | Admitting: Emergency Medicine

## 2016-02-04 ENCOUNTER — Encounter: Payer: Self-pay | Admitting: Emergency Medicine

## 2016-02-04 DIAGNOSIS — J449 Chronic obstructive pulmonary disease, unspecified: Secondary | ICD-10-CM

## 2016-02-04 NOTE — Assessment & Plan Note (Signed)
Difficult situation as she does have severe obstruction with an asthmatic component but she is not on any scheduled bronchodilators at this time. She does tolerate an inhaled corticosteroid. She is to try a long-acting bronchodilator. We discussed this in detail today. For now I will defer.  We will proceed with Pulmicort 2 puffs twice a day.  We will hold off on starting an additional inhaled medication at this time.  Take albuterol 2 puffs up to every 4 hours if needed for shortness of breath.  Follow with Dr Lamonte Sakai in 3 months or sooner if you have any problems.

## 2016-02-04 NOTE — Progress Notes (Signed)
Subjective:    Patient ID: [name], female    DOB: 03/14/1945, 71 y.o.   MRN: [id number]  HPI OV 03/20/14 -- Patient comes in today for follow-up of her known chronic obstructive asthma. He has been maintained on Qvar, and at the last visit we tried Spiriva Respimat. Because of expense, she has been using half dose, and most recently has been having issues with increasing shortness of breath. She has been using her rescue inhaler excessively, but is unsure if this helps. She has not wanted to use LABA's because of side effects. He should also be kept in mind that she has some type of neuromuscular disease which appears to be progressing, wraps this is the cause of her dyspnea. She is scheduled to be evaluated at [facility] in the near future.  ROV 08/21/14 -- follow-up visit for patient who has been managed by Dr. Gwenette Greet for severe chronic obstructive disease with an asthmatic component based on a positive bronchodilator response on temporary function testing from 01/23/13 that I have personally reviewed. On that study she also had restricted lung volumes and a decreased diffusion capacity. 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 She was seen at [facility] re: the myopathy, was defined as mild-to-mod on EMG. Is planning to do some PT.  She is using Spiriva respimat 1 puff qd + QVAR bid. Rarely uses albuterol. She describes significant exertional dyspnea, especially with staircase, lifting.  She denies any cough, no mucous production. She underwent a walking oximetry w Dr [name], did not desaturate.   ROV 02/21/15 -- follow-up for severe COPD with an asthmatic component and a positive bronchodilator response. Also some coinciding restrictive lung disease that may be associated with a mitochondrial myopathy. She has been managed on Spiriva, Qvar, pro-air when necessary. She returns today c/o cough that is new compared with last visit. She has a lot of throat  clearing, seems to be exacerbated by Qvar. She is on Spiriva > causes urinary retention. She has some rhinitis, she is having a lot of reflux since last time.   ROV 04/26/15 -- patient is a history of severe COPD with a positive bronchodilator response on spirometry. I last saw her in January 2017. At her last visit we changed her bronchodilators and her QVAR because she has been having side effects. She has had a lot of throat clearing. She developed more dyspnea and went back on QVAR. She has used albuterol with relief but with tremor as a side effect. She tells me that she fell the other day, hit her R flank.   ROV 10/01/15 -- is a follow-up visit for a patient with a history of severe COPD and an asthmatic component based on spirometry. Her FEV1 in 2014 was 0.88 L Consistent with very severe obstruction. Currently managed only on QVAR. She has been evaluated by Dr. Barry Dienes for cholelithiasis and may need to undergo a cholecystectomy. She is having upper abd pain, nausea. She states that her breathing has been bad since our last visit. She cannot do stairs anymore. She takes proair prn, helps her breathing but has the side effect of tremor.   ROV 02/04/16 -- patient has a history of severe obstructive lung disease with an asthmatic component based on a bronchodilator response on her spirometry. She has been managed with Qvar only recently noted to have a formulary changed to Pulmicort.  She uses albuterol rarely mainly because of the significant tremor associated with it.  Review of Systems  Constitutional: Negative for fever and unexpected weight change.  HENT: Positive for congestion, postnasal drip and sinus pressure. Negative for dental problem, ear pain, nosebleeds, rhinorrhea, sneezing, sore throat and trouble swallowing.   Eyes: Negative for redness and itching.  Respiratory: Positive for cough, chest tightness and shortness of breath. Negative for wheezing.   Cardiovascular: Negative for  palpitations and leg swelling.  Gastrointestinal: Negative for nausea and vomiting.  Genitourinary: Negative for dysuria.  Musculoskeletal: Negative for joint swelling.  Skin: Negative for rash.  Neurological: Negative for headaches.  Hematological: Does not bruise/bleed easily.  Psychiatric/Behavioral: Negative for dysphoric mood. The patient is not nervous/anxious.     Past Medical History:  Diagnosis Date  . Allergy   . Anemia   . Anxiety   . Asthma   . Blood transfusion without reported diagnosis   . Cholelithiasis   . Chronic kidney disease    gallstones   . Heart attack   . Hemorrhoids   . IBS (irritable bowel syndrome)   . Iliotibial band syndrome    left knee  . Migraine   . Mitochondrial myopathy   . Normal coronary arteries    by cardiac catheterization performed by myself 02/25/01  . Osteoporosis   . Pericarditis    age 63  . Pneumothorax   . 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 PVC's (premature ventricular contractions)   . Vitamin D deficiency      Family History  Problem Relation Age of Onset  . Asthma Mother   . Arthritis Mother   . Depression Mother   . Cancer Father   . Emphysema Father   . Stroke Father   . Alcohol abuse Father   . Heart attack Father   . Colon cancer Neg Hx      Social History   Social History  . Marital status: Married    Spouse name: Richard  . Number of children: 2  . Years of education: Grad   Occupational History  . Psychologist, educational Retired   Social History Main Topics  . Smoking status: Never Smoker  . Smokeless tobacco: Never Used     Comment: never used tobacco  . Alcohol use 1.2 oz/week    2 Glasses of wine per week     Comment: occasionally  . Drug use: No  . Sexual activity: Not on file   Other Topics Concern  . Not on file   Social History Narrative   Health Care POA:    Emergency Contact: husband, Francene Finders, (c) [phone]   End of Life Plan:    Who lives with you: husband   Any pets:  none   Diet: Pt has a varied diet.  Eats 5 sm. meals throughout day, focuses on protein, doesn't care for fruits and vegetables very much.   Exercise: Pt has a personal training and exercises several times a week.   Seatbelts: Pt reports wearing seatbelt when in vehicle.   Nancy Fetter Exposure/Protection: Pt reports wearing sun protection.    Hobbies: reading, visiting with friends   Patient has a Scientist, water quality.   Patient has two children.   Patient is retired.   Patient does not drink any caffeine.   Patient is right handed.              Allergies  Allergen Reactions  . Dulera [Mometasone Furo-Formoterol Fum] Cough     Outpatient Medications Prior to Visit  Medication Sig Dispense Refill  . albuterol (PROAIR HFA) 108 (  90 Base) MCG/ACT inhaler Inhale 2 puffs into the lungs every 6 (six) hours as needed for wheezing or shortness of breath. 1 Inhaler 5  . albuterol (PROVENTIL HFA;VENTOLIN HFA) 108 (90 Base) MCG/ACT inhaler Inhale 2 puffs into the lungs every 4 (four) hours as needed for wheezing or shortness of breath. 1 Inhaler 4  . aspirin 81 MG tablet Take 81 mg by mouth daily.    . Estradiol-Norethindrone Acet (LOPREEZA) 0.5-0.1 MG per tablet Take 1 tablet by mouth daily.    . hydrocortisone valerate cream (WESTCORT) 0.2 % Apply 1 application topically 2 (two) times daily as needed (itching).     . LevOCARNitine L-Tartrate (L-CARNITINE) 500 MG CAPS Take 2 capsules (1,000 mg total) by mouth daily. 60 capsule 4  . LORazepam (ATIVAN) 0.5 MG tablet TAKE 1 TABLET BY MOUTH EVERY NIGHT AT BEDTIME 30 tablet 0  . Melatonin 3 MG TABS Take 3 mg by mouth at bedtime.     Marland Kitchen omeprazole (PRILOSEC) 40 MG capsule Take 40 mg by mouth 2 (two) times daily.     . pantoprazole (PROTONIX) 40 MG tablet Take 40 mg by mouth daily at 8 pm.    . sertraline (ZOLOFT) 100 MG tablet Take 100 mg by mouth every morning.  2  . Vitamin D, Ergocalciferol, (DRISDOL) 50000 UNITS CAPS capsule Take 50,000 Units by mouth every  Sunday.     . Wheat Dextrin (CLEAR SOLUBLE FIBER) POWD Take 5 mLs by mouth every morning.     . beclomethasone (QVAR) 80 MCG/ACT inhaler Inhale 2 puffs into the lungs 2 (two) times daily. 1 Inhaler 2  . Budesonide 90 MCG/ACT inhaler Inhale 2 puffs into the lungs 2 (two) times daily. 1 Inhaler 4  . QVAR 80 MCG/ACT inhaler INHALE 2 PUFFS INTO THE LUNGS BID  5   No facility-administered medications prior to visit.          Objective:   Physical Exam Vitals:   02/04/16 1621 02/04/16 1622  BP:  120/70  Pulse:  98  SpO2:  96%  Weight: 136 lb (61.7 kg)   Height: 5\' 3"  (1.6 m)    Gen: Pleasant, well-nourished, in no distress,  normal affect  ENT: No lesions,  mouth clear,  oropharynx clear, no postnasal drip  Neck: No JVD, no TMG, no carotid bruits  Lungs: No use of accessory muscles, clear without rales or rhonchi  Cardiovascular: RRR, heart sounds normal, no murmur or gallops, no peripheral edema  Musculoskeletal: No deformities, no cyanosis or clubbing  Neuro: alert, non focal  Skin: Warm, no lesions or rashes       Assessment & Plan:  COPD (chronic obstructive pulmonary disease) (HCC) Difficult situation as she does have severe obstruction with an asthmatic component but she is not on any scheduled bronchodilators at this time. She does tolerate an inhaled corticosteroid. She is to try a long-acting bronchodilator. We discussed this in detail today. For now I will defer.  We will proceed with Pulmicort 2 puffs twice a day.  We will hold off on starting an additional inhaled medication at this time.  Take albuterol 2 puffs up to every 4 hours if needed for shortness of breath.  Follow with Dr Lamonte Sakai in 3 months or sooner if you have any problems.  Baltazar Apo, MD, PhD 02/04/2016, 4:49 PM [facility] [phone] or if no answer [phone]

## 2016-02-04 NOTE — Patient Instructions (Addendum)
We will proceed with Pulmicort 2 puffs twice a day.  We will hold off on starting an additional inhaled medication at this time.  Take albuterol 2 puffs up to every 4 hours if needed for shortness of breath.  Follow with Dr Lamonte Sakai in 3 months or sooner if you have any problems.

## 2016-02-17 ENCOUNTER — Telehealth: Payer: Self-pay | Admitting: Emergency Medicine

## 2016-02-17 NOTE — Telephone Encounter (Signed)
Called and spoke to pt. Pt states when she takes the Pulmicort she gets a sore throat for 2 hours, headache and dry cough. Pt is requesting to change inhalers. Pt states overall her breathing is unchanged since last OV. Pt states she was taking Qvar and this worked very well for her but insurance didn't cover it.   Dr. Lamonte Sakai please advise. Thanks.

## 2016-02-19 NOTE — Telephone Encounter (Signed)
Spoke with pt. She is aware of RB's recommendation. Samples of Symbicort have been left at the front desk for pick up. Nothing further was needed.

## 2016-02-19 NOTE — Telephone Encounter (Signed)
Difficult situation as she has had side effects from multiple meds that we have tried. Given her wheezing, I would have her try Symbicort 160/4.34mcg 2 puffs bid. She has been jittery with LABA's before > if she gets jittery with this on then have her decrease to 1 puff bid.

## 2016-02-19 NOTE — Telephone Encounter (Signed)
RB  Please see previous phone message on this pt. She called in today saying she is now having a lot of wheezing

## 2016-02-19 NOTE — Telephone Encounter (Signed)
Patient calling back as she has not heard anything, CB is [phone].  States she has to leave at 2:00 pm.  States she is having a lot of wheezing right now.

## 2016-02-21 DIAGNOSIS — D3132 Benign neoplasm of left choroid: Secondary | ICD-10-CM | POA: Diagnosis not present

## 2016-02-21 DIAGNOSIS — H2513 Age-related nuclear cataract, bilateral: Secondary | ICD-10-CM | POA: Diagnosis not present

## 2016-02-21 DIAGNOSIS — H2512 Age-related nuclear cataract, left eye: Secondary | ICD-10-CM | POA: Diagnosis not present

## 2016-02-21 DIAGNOSIS — H43813 Vitreous degeneration, bilateral: Secondary | ICD-10-CM | POA: Diagnosis not present

## 2016-02-27 DIAGNOSIS — R51 Headache: Secondary | ICD-10-CM | POA: Diagnosis not present

## 2016-02-27 DIAGNOSIS — D509 Iron deficiency anemia, unspecified: Secondary | ICD-10-CM | POA: Diagnosis not present

## 2016-02-27 DIAGNOSIS — K589 Irritable bowel syndrome without diarrhea: Secondary | ICD-10-CM | POA: Diagnosis not present

## 2016-02-27 DIAGNOSIS — M549 Dorsalgia, unspecified: Secondary | ICD-10-CM | POA: Diagnosis not present

## 2016-02-27 DIAGNOSIS — J449 Chronic obstructive pulmonary disease, unspecified: Secondary | ICD-10-CM | POA: Diagnosis not present

## 2016-02-27 DIAGNOSIS — D45 Polycythemia vera: Secondary | ICD-10-CM | POA: Diagnosis not present

## 2016-02-27 DIAGNOSIS — H25812 Combined forms of age-related cataract, left eye: Secondary | ICD-10-CM | POA: Diagnosis not present

## 2016-03-04 ENCOUNTER — Other Ambulatory Visit (HOSPITAL_BASED_OUTPATIENT_CLINIC_OR_DEPARTMENT_OTHER): Payer: Medicare Other

## 2016-03-04 ENCOUNTER — Ambulatory Visit (HOSPITAL_BASED_OUTPATIENT_CLINIC_OR_DEPARTMENT_OTHER): Payer: Medicare Other | Admitting: Family

## 2016-03-04 ENCOUNTER — Other Ambulatory Visit: Payer: Self-pay | Admitting: *Deleted

## 2016-03-04 ENCOUNTER — Ambulatory Visit (HOSPITAL_BASED_OUTPATIENT_CLINIC_OR_DEPARTMENT_OTHER): Payer: Medicare Other

## 2016-03-04 VITALS — BP 118/63 | HR 85 | Temp 97.9°F | Resp 20 | Wt 138.0 lb

## 2016-03-04 VITALS — BP 123/59 | HR 85 | Temp 98.1°F | Resp 16

## 2016-03-04 DIAGNOSIS — G713 Mitochondrial myopathy, not elsewhere classified: Secondary | ICD-10-CM

## 2016-03-04 DIAGNOSIS — D45 Polycythemia vera: Secondary | ICD-10-CM

## 2016-03-04 DIAGNOSIS — D500 Iron deficiency anemia secondary to blood loss (chronic): Secondary | ICD-10-CM

## 2016-03-04 DIAGNOSIS — D508 Other iron deficiency anemias: Secondary | ICD-10-CM

## 2016-03-04 LAB — COMPREHENSIVE METABOLIC PANEL (CC13)
ALT: 32 IU/L (ref 0–32)
AST (SGOT): 40 IU/L (ref 0–40)
Albumin, Serum: 3.7 g/dL (ref 3.5–4.8)
Albumin/Globulin Ratio: 1.2 (ref 1.2–2.2)
Alkaline Phosphatase, S: 71 IU/L (ref 39–117)
BUN/Creatinine Ratio: 21 (ref 12–28)
BUN: 12 mg/dL (ref 8–27)
Bilirubin Total: 0.6 mg/dL (ref 0.0–1.2)
Calcium, Ser: 8.7 mg/dL (ref 8.7–10.3)
Carbon Dioxide, Total: 24 mmol/L (ref 18–29)
Chloride, Ser: 100 mmol/L (ref 96–106)
Creatinine, Ser: 0.56 mg/dL — ABNORMAL LOW (ref 0.57–1.00)
GFR calc Af Amer: 108 mL/min/{1.73_m2} (ref >59)
GFR calc non Af Amer: 93 mL/min/{1.73_m2} (ref >59)
Globulin, Total: 3.1 g/dL (ref 1.5–4.5)
Glucose: 91 mg/dL (ref 65–99)
Potassium, Ser: 4 mmol/L (ref 3.5–5.2)
Sodium: 135 mmol/L (ref 134–144)
Total Protein: 6.8 g/dL (ref 6.0–8.5)

## 2016-03-04 LAB — CBC WITH DIFFERENTIAL (CANCER CENTER ONLY)
BASO#: 0 10*3/uL (ref 0.0–0.2)
BASO%: 0.2 % (ref 0.0–2.0)
EOS%: 1.2 % (ref 0.0–7.0)
Eosinophils Absolute: 0.1 10*3/uL (ref 0.0–0.5)
HCT: 45.2 % (ref 34.8–46.6)
HGB: 15.2 g/dL (ref 11.6–15.9)
LYMPH#: 0.8 10*3/uL — ABNORMAL LOW (ref 0.9–3.3)
LYMPH%: 10.1 % — ABNORMAL LOW (ref 14.0–48.0)
MCH: 32.8 pg (ref 26.0–34.0)
MCHC: 33.6 g/dL (ref 32.0–36.0)
MCV: 98 fL (ref 81–101)
MONO#: 0.8 10*3/uL (ref 0.1–0.9)
MONO%: 9.3 % (ref 0.0–13.0)
NEUT#: 6.4 10*3/uL (ref 1.5–6.5)
NEUT%: 79.2 % (ref 39.6–80.0)
Platelets: 172 10*3/uL (ref 145–400)
RBC: 4.63 10*6/uL (ref 3.70–5.32)
RDW: 15.8 % — ABNORMAL HIGH (ref 11.1–15.7)
WBC: 8.1 10*3/uL (ref 3.9–10.0)

## 2016-03-04 MED ORDER — SODIUM CHLORIDE 0.9 % IV SOLN
500.0000 mL | Freq: Once | INTRAVENOUS | Status: AC
  Administered 2016-03-04: 500 mL via INTRAVENOUS

## 2016-03-04 MED ORDER — FAMOTIDINE IN NACL 20-0.9 MG/50ML-% IV SOLN: 40.0000 mg | Freq: Two times a day (BID) | INTRAVENOUS | Status: DC

## 2016-03-04 MED ORDER — METHYLPREDNISOLONE SODIUM SUCC 125 MG IJ SOLR: 125.0000 mg | Freq: Once | INTRAMUSCULAR | Status: DC

## 2016-03-04 NOTE — Progress Notes (Signed)
Hematology and Oncology Follow Up Visit  LAJESSICA GARVER [id number] December 21, 1945 71 y.o. 03/04/2016   Principle Diagnosis:  1. 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 Phlebotomy to maintain hematocrit below 45% 2. Aspirin 81 mg p.o. Daily. 3. IV iron as indicated - last received Dec/Jan x 2    Interim History:  Ms. Kruizenga is here today for follow-up. She is doing quite well. She has changed to a new inhaler that his helped improve her SOB. Her energy has also improved.  She received IV iron in Dec and Jan and feels that this helped.  She has had a headache off and on due to her old glasses. She had cataract surgery on the left eye and is so happy with the result. She will be having the right eye done in two weeks.  No fever, chills, ice cravings, vomiting, cough, rash, chest pain, abdominal pain or changes in bowel or bladder habits.  She has had no episodes of bleeding. She is on 1 baby aspirin daily and does bruise easily.  She has had occasional palpitations due to stress. These episodes resolve without intervention.  She has IBS and fluctuates between constipation and diarrhea.  No swelling, tenderness, numbness or tingling in her extremities. No c/o pain.  She has maintained a good appetite and is staying hydrated. She is trying to eat healthier in an attempt to lose some weight. Her weight is stable.    Medications:  Allergies as of 03/04/2016      Reactions   Dulera [mometasone Furo-formoterol Fum] Cough      Medication List       Accurate as of 03/04/16  3:32 PM. Always use your most recent med list.          albuterol 108 (90 Base) MCG/ACT inhaler Commonly known as:  PROVENTIL HFA;VENTOLIN HFA Inhale 2 puffs into the lungs every 4 (four) hours as needed for wheezing or shortness of breath.   aspirin 81 MG  tablet Take 81 mg by mouth daily.   CLEAR SOLUBLE FIBER Powd Take 5 mLs by mouth every morning.   escitalopram 5 MG tablet Commonly known as:  LEXAPRO 5 mg.   hydrocortisone valerate cream 0.2 % Commonly known as:  WESTCORT Apply 1 application topically 2 (two) times daily as needed (itching).   L-Carnitine 500 MG Caps Take 2 capsules (1,000 mg total) by mouth daily.   LOPREEZA 0.5-0.1 MG tablet Generic drug:  Estradiol-Norethindrone Acet Take 1 tablet by mouth daily.   LORazepam 0.5 MG tablet Commonly known as:  ATIVAN TAKE 1 TABLET BY MOUTH EVERY NIGHT AT BEDTIME   Melatonin 3 MG Tabs Take 3 mg by mouth at bedtime.   moxifloxacin 0.5 % ophthalmic solution Commonly known as:  VIGAMOX Place 1 drop into the left eye 4 times daily.   pantoprazole 40 MG tablet Commonly known as:  PROTONIX Take 40 mg by mouth daily at 8 pm.   prednisoLONE acetate 1 % ophthalmic suspension Commonly known as:  PRED FORTE Place 1 drop into the left eye 4 times daily.   Vitamin D (Ergocalciferol) 50000 units Caps capsule Commonly known as:  DRISDOL Take 50,000 Units by mouth every Sunday.       Allergies:  Allergies  Allergen Reactions  . Dulera [Mometasone Furo-Formoterol Fum] Cough    Past Medical History, Surgical history, Social history, and  Family History were reviewed and updated.  Review of Systems: All other 10 point review of systems is negative.   Physical Exam:  weight is 138 lb (62.6 kg). Her oral temperature is 97.9 F (36.6 C). Her blood pressure is 118/63 and her pulse is 85. Her respiration is 20.   Wt Readings from Last 3 Encounters:  03/04/16 138 lb (62.6 kg)  02/04/16 136 lb (61.7 kg)  01/17/16 137 lb (62.1 kg)    Ocular: Sclerae unicteric, pupils equal, round and reactive to light Ear-nose-throat: Oropharynx clear, dentition fair Lymphatic: No cervical supraclavicular or axillary adenopathy Lungs no rales or rhonchi, good excursion bilaterally Heart  regular rate and rhythm, no murmur appreciated Abd soft, nontender, positive bowel sounds, no liver or spleen tip palpated on exam, no fluid wave MSK no focal spinal tenderness, no joint edema Neuro: non-focal, well-oriented, appropriate affect Breasts: Deferred  Lab Results  Component Value Date   WBC 8.1 03/04/2016   HGB 15.2 03/04/2016   HCT 45.2 03/04/2016   MCV 98 03/04/2016   PLT 172 03/04/2016   Lab Results  Component Value Date   FERRITIN 27 01/17/2016   IRON 37 (L) 01/17/2016   TIBC 340 01/17/2016   UIBC 303 01/17/2016   IRONPCTSAT 11 (L) 01/17/2016   Lab Results  Component Value Date   RETICCTPCT 1.5 06/12/2014   RBC 4.63 03/04/2016   RETICCTABS 65.9 06/12/2014   No results found for: Nils Pyle [facility] Lab Results  Component Value Date   IGGSERUM 979 02/27/2014   IGA 339 02/27/2014   IGMSERUM 129 02/27/2014   No results found for: Odetta Pink, SPEI   Chemistry      Component Value Date/Time   NA 138 01/17/2016 1356   K 4.1 01/17/2016 1356   CL 102 10/25/2015 1414   CL 104 09/24/2015 1151   CO2 25 01/17/2016 1356   BUN 12.3 01/17/2016 1356   CREATININE 0.8 01/17/2016 1356      Component Value Date/Time   CALCIUM 9.1 01/17/2016 1356   ALKPHOS 87 01/17/2016 1356   AST 48 (H) 01/17/2016 1356   ALT 38 01/17/2016 1356   BILITOT 0.67 01/17/2016 1356     Impression and Plan: Ms. Lacewell is 71 yo female with both hemochromatosis and polycythemia. She also has iron deficiency due to phlebotomies. She has had some mild fatigue at times but has noticed that overall her energy is much better.   Hct is 45.2. She would like to be phlebotomized so we will do a "mini" phlebotomy today and only take out 250 ml and give the same amount in replacement fluids.  She received 2 doses of IV iron earlier this year for an iron saturation of 11% and responded nicely. We will see what her iron studies  with this visit show and replace if needed.  We will see her back in 6 weeks for repeat lab work and follow-up.  She will contact our office with any questions or concerns. We can certainly see her sooner if need be.   Eliezer Bottom, NP 2/7/20183:32 PM

## 2016-03-04 NOTE — Patient Instructions (Signed)
Therapeutic Phlebotomy Therapeutic phlebotomy is the controlled removal of blood from a person's body for the purpose of treating a medical condition. The procedure is similar to donating blood. Usually, about a pint (470 mL, or 0.47L) of blood is removed. The average adult has 9-12 pints (4.3-5.7 L) of blood. Therapeutic phlebotomy may be used to treat the following medical conditions:  Hemochromatosis. This is a condition in which the blood contains too much iron.  Polycythemia vera. This is a condition in which the blood contains too many red blood cells.  Porphyria cutanea tarda. This is a disease in which an important part of hemoglobin is not made properly. It results in the buildup of abnormal amounts of porphyrins in the body.  Sickle cell disease. This is a condition in which the red blood cells form an abnormal crescent shape rather than a round shape. Tell a health care provider about:  Any allergies you have.  All medicines you are taking, including vitamins, herbs, eye drops, creams, and over-the-counter medicines.  Any problems you or family members have had with anesthetic medicines.  Any blood disorders you have.  Any surgeries you have had.  Any medical conditions you have. What are the risks? Generally, this is a safe procedure. However, problems may occur, including:  Nausea or light-headedness.  Low blood pressure.  Soreness, bleeding, swelling, or bruising at the needle insertion site.  Infection. What happens before the procedure?  Follow instructions from your health care provider about eating or drinking restrictions.  Ask your health care provider about changing or stopping your regular medicines. This is especially important if you are taking diabetes medicines or blood thinners.  Wear clothing with sleeves that can be raised above the elbow.  Plan to have someone take you home after the procedure.  You may have a blood sample taken. What happens  during the procedure?  A needle will be inserted into one of your veins.  Tubing and a collection bag will be attached to that needle.  Blood will flow through the needle and tubing into the collection bag.  You may be asked to open and close your hand slowly and continually during the entire collection.  After the specified amount of blood has been removed from your body, the collection bag and tubing will be clamped.  The needle will be removed from your vein.  Pressure will be held on the site of the needle insertion to stop the bleeding.  A bandage (dressing) will be placed over the needle insertion site. The procedure may vary among health care providers and hospitals. What happens after the procedure?  Your recovery will be assessed and monitored.  You can return to your normal activities as directed by your health care provider. This information is not intended to replace advice given to you by your health care provider. Make sure you discuss any questions you have with your health care provider. Document Released: 06/16/2010 Document Revised: 09/14/2015 Document Reviewed: 01/08/2014 Elsevier Interactive Patient Education  2017 [employer]  

## 2016-03-04 NOTE — Progress Notes (Signed)
Katheran Awe presents today for phlebotomy per MD orders. Phlebotomy procedure started at 1545 and ended at 1600 using 20g IV catheter.  250 grams removed. Patient observed for 30 minutes after procedure without any incident. Patient tolerated procedure well. IV needle removed intact.  Per Judson Roch, NP - "mini-phlebotomy" performed with 250g removed and 250cc IVF given after. dph

## 2016-03-05 ENCOUNTER — Telehealth: Payer: Self-pay

## 2016-03-05 LAB — FERRITIN: Ferritin: 971 ng/ml — ABNORMAL HIGH (ref 9–269)

## 2016-03-05 LAB — IRON AND TIBC
%SAT: 73 % — ABNORMAL HIGH (ref 21–57)
Iron: 169 ug/dL — ABNORMAL HIGH (ref 41–142)
TIBC: 231 ug/dL — ABNORMAL LOW (ref 236–444)
UIBC: 63 ug/dL — ABNORMAL LOW (ref 120–384)

## 2016-03-05 NOTE — Telephone Encounter (Addendum)
-----   Message from Eliezer Bottom, NP sent at 03/05/2016 11:57 AM EST ----- Regarding: Iron  Iron is up, no infusion needed. Thank you!  [name]  Message left on personalized VM. dph  ----- Message ----- From: Interface, Lab In Three Zero One Sent: 03/04/2016   2:51 PM To: Eliezer Bottom, NP

## 2016-03-11 DIAGNOSIS — H25811 Combined forms of age-related cataract, right eye: Secondary | ICD-10-CM | POA: Diagnosis not present

## 2016-03-11 DIAGNOSIS — H43812 Vitreous degeneration, left eye: Secondary | ICD-10-CM | POA: Diagnosis not present

## 2016-03-11 DIAGNOSIS — S0502XA Injury of conjunctiva and corneal abrasion without foreign body, left eye, initial encounter: Secondary | ICD-10-CM | POA: Diagnosis not present

## 2016-03-11 DIAGNOSIS — Z961 Presence of intraocular lens: Secondary | ICD-10-CM | POA: Diagnosis not present

## 2016-03-11 DIAGNOSIS — Z9842 Cataract extraction status, left eye: Secondary | ICD-10-CM | POA: Diagnosis not present

## 2016-03-11 DIAGNOSIS — Z48810 Encounter for surgical aftercare following surgery on the sense organs: Secondary | ICD-10-CM | POA: Diagnosis not present

## 2016-03-11 DIAGNOSIS — D3132 Benign neoplasm of left choroid: Secondary | ICD-10-CM | POA: Diagnosis not present

## 2016-03-11 DIAGNOSIS — H11823 Conjunctivochalasis, bilateral: Secondary | ICD-10-CM | POA: Diagnosis not present

## 2016-03-11 DIAGNOSIS — H02052 Trichiasis without entropian right lower eyelid: Secondary | ICD-10-CM | POA: Diagnosis not present

## 2016-03-16 ENCOUNTER — Ambulatory Visit (INDEPENDENT_AMBULATORY_CARE_PROVIDER_SITE_OTHER): Payer: Medicare Other | Admitting: Sports Medicine

## 2016-03-16 ENCOUNTER — Encounter: Payer: Self-pay | Admitting: Sports Medicine

## 2016-03-16 ENCOUNTER — Encounter (INDEPENDENT_AMBULATORY_CARE_PROVIDER_SITE_OTHER): Payer: Self-pay

## 2016-03-16 ENCOUNTER — Other Ambulatory Visit: Payer: Self-pay | Admitting: *Deleted

## 2016-03-16 ENCOUNTER — Ambulatory Visit
Admission: RE | Admit: 2016-03-16 | Discharge: 2016-03-16 | Disposition: A | Discharge status: Home / Self Care | Payer: Medicare Other | Source: Ambulatory Visit | Attending: Sports Medicine | Admitting: Sports Medicine

## 2016-03-16 VITALS — BP 132/73 | HR 88 | Ht 63.0 in | Wt 138.0 lb

## 2016-03-16 DIAGNOSIS — M545 Low back pain, unspecified: Secondary | ICD-10-CM

## 2016-03-16 DIAGNOSIS — M47816 Spondylosis without myelopathy or radiculopathy, lumbar region: Secondary | ICD-10-CM | POA: Diagnosis not present

## 2016-03-16 MED ORDER — TRAMADOL HCL 50 MG PO TABS: 50.0000 mg | ORAL_TABLET | Freq: Three times a day (TID) | ORAL | 0 refills | Status: DC | PRN

## 2016-03-16 NOTE — Progress Notes (Addendum)
   Subjective:    Patient ID: [name], female    DOB: 03-Apr-1945, 71 y.o.   MRN: [id number]  HPI  [name] is a 71 yo F w/ PMH of osteoporosis and mitochondrial myopathy in for evaluation of LBP. Describes 7/10 "ache" R. Sided LBP of 1 weeks duration. Denies obvious trauma. Reports steady onset of pain, now preventing her from sleeping. Pain worsened w/ flexion and twisting. Partially improved w/ 200mg  ibuprofen once daily, although patient reports gastritis w/ higher doses. Denies worsening of weakness. Denies loss of sensation/shooting pains. She denies history of previous compression fracture.  Past medical history reviewed Medications reviewed Allergies reviewed  Of note, patient had similar MSK pain a few years ago improved w/ back brace.    Review of Systems ROS - as per HPI    Objective:   Physical Exam General: frail-appearing F sitting uncomfortably in chair, A&Ox3  MSK: - mild bruising over R. Flank - pain to percussion over T/L-spine - ROM reduced to 90 degrees to flexion of hip/back - Pain w/ lateral flexion of back - 4/5 strength to hip flexion/extension, leg flexion/extension b/l - 2+ patellar reflexes b/l - negative SLR     Assessment & Plan:  71 yo F w/ PMH of osteoporosis w/ acute non-traumatic back pain and pain to percussion of T/L-spine on exam. DDx: Compression fracture, MSK pain, disc herniation. Spinal tenderness w/ osteoporosis makes compression fracture likely. Get XR to r/o compression fracture. SLR negative w/ no nerve symptoms makes disc herniation unlikely. - AP and lateral lumbar spine XR - Back brace - Tramadol  - F/U in one week or w/ worsening symptoms  Patient seen and evaluated with the medical student. I agree with the above plan of care. Patient has tenderness to percussion along the lower thoracolumbar area. She is also quite uncomfortable. We will check an x-ray to rule out a compression fracture. She does have a history of  similar pain in the past which resolved with several months of treatment including a lumbar brace. She will try tramadol for pain control. Phone follow-up with the results of the x-ray when available. Patient will follow-up with me in 1-2 weeks but will notify me of any worsening pain in the interim.  Addendum: X-rays show multilevel degenerative changes as well as an old stable L2 compression fracture. I suspect that her back pain a few years ago was likely related to this fracture. Although today's x-rays do not show evidence of an acute fracture I'm clinically suspicious that she may have an occult fracture. We will need to repeat her x-ray in 2-3 weeks to reevaluate. In the meantime, continue with treatment as above.

## 2016-03-19 DIAGNOSIS — J449 Chronic obstructive pulmonary disease, unspecified: Secondary | ICD-10-CM | POA: Diagnosis not present

## 2016-03-19 DIAGNOSIS — D45 Polycythemia vera: Secondary | ICD-10-CM | POA: Diagnosis not present

## 2016-03-19 DIAGNOSIS — H25811 Combined forms of age-related cataract, right eye: Secondary | ICD-10-CM | POA: Diagnosis not present

## 2016-03-19 DIAGNOSIS — D751 Secondary polycythemia: Secondary | ICD-10-CM | POA: Diagnosis not present

## 2016-03-19 DIAGNOSIS — D509 Iron deficiency anemia, unspecified: Secondary | ICD-10-CM | POA: Diagnosis not present

## 2016-03-23 ENCOUNTER — Ambulatory Visit (INDEPENDENT_AMBULATORY_CARE_PROVIDER_SITE_OTHER): Payer: Medicare Other | Admitting: Sports Medicine

## 2016-03-23 ENCOUNTER — Other Ambulatory Visit: Payer: Self-pay | Admitting: *Deleted

## 2016-03-23 ENCOUNTER — Encounter: Payer: Self-pay | Admitting: Sports Medicine

## 2016-03-23 VITALS — BP 126/59 | HR 92 | Ht 63.0 in | Wt 138.0 lb

## 2016-03-23 DIAGNOSIS — M545 Low back pain, unspecified: Secondary | ICD-10-CM

## 2016-03-23 DIAGNOSIS — G8929 Other chronic pain: Secondary | ICD-10-CM | POA: Diagnosis not present

## 2016-03-23 MED ORDER — CALCITONIN (SALMON) 200 UNIT/ACT NA SOLN: 1.0000 | Freq: Every day | NASAL | 2 refills | Status: DC

## 2016-03-23 NOTE — Progress Notes (Signed)
   Subjective:    Patient ID: [name], female    DOB: 1945-02-15, 71 y.o.   MRN: [id number]  HPI   Patient comes in today for follow-up on low back pain. She still having significant pain. X-rays last week showed evidence of an old L2 compression fracture but nothing acute. She has not been able to take her tramadol as it has caused stomach upset. In fact, she is unable to take most oral pain medications due to her stomach issues. The lumbar corset that she was given last week is dysfunctional. The Velcro across the front of it does not work. As a result her back brace is uncomfortable. She denies numbness or tingling in her legs. No change in bowel or bladder.    Review of Systems     Objective:   Physical Exam  Sitting comfortable in exam room  Patient has pain with any sort of lumbar motion. Still tender to palpation along the lower lumbar midline.      Assessment & Plan:    Low back pain likely secondary to occult compression fracture  I discussed options including MRI, vertebroplasty, intranasal calcitonin, and watchful waiting. Since she cannot take most oral pain medications we will try intranasal calcitonin. She will use one spray per day for 4 weeks. She will follow-up with me in 3 weeks for reevaluation and will get a follow-up x-ray prior to that appointment. We want to avoid MRI and overly aggressive treatment if possible given her frail overall state of health. Patient will call me with questions or concerns prior to her follow-up visit. Of note, we did replace her dysfunctional lumbar corset with one that works.

## 2016-03-25 ENCOUNTER — Other Ambulatory Visit: Payer: Self-pay | Admitting: *Deleted

## 2016-03-25 MED ORDER — CYCLOBENZAPRINE HCL 10 MG PO TABS: ORAL_TABLET | ORAL | 0 refills | Status: DC

## 2016-03-26 DIAGNOSIS — K5903 Drug induced constipation: Secondary | ICD-10-CM | POA: Diagnosis not present

## 2016-03-26 DIAGNOSIS — M545 Low back pain: Secondary | ICD-10-CM | POA: Diagnosis not present

## 2016-03-26 DIAGNOSIS — Z6824 Body mass index (BMI) 24.0-24.9, adult: Secondary | ICD-10-CM | POA: Diagnosis not present

## 2016-03-30 ENCOUNTER — Encounter: Payer: Self-pay | Admitting: *Deleted

## 2016-03-30 NOTE — Patient Instructions (Signed)
[facility] Dr Lynann Bologna Friday 04/10/16 at 245pm [place]   [phone]

## 2016-04-02 ENCOUNTER — Other Ambulatory Visit: Payer: Self-pay | Admitting: Family Medicine

## 2016-04-02 DIAGNOSIS — M545 Low back pain, unspecified: Secondary | ICD-10-CM

## 2016-04-02 DIAGNOSIS — G8929 Other chronic pain: Secondary | ICD-10-CM

## 2016-04-04 ENCOUNTER — Ambulatory Visit
Admission: RE | Admit: 2016-04-04 | Discharge: 2016-04-04 | Disposition: A | Discharge status: Home / Self Care | Payer: Medicare Other | Source: Ambulatory Visit | Attending: Family Medicine | Admitting: Family Medicine

## 2016-04-04 DIAGNOSIS — M545 Low back pain, unspecified: Secondary | ICD-10-CM

## 2016-04-04 DIAGNOSIS — M5126 Other intervertebral disc displacement, lumbar region: Secondary | ICD-10-CM | POA: Diagnosis not present

## 2016-04-04 DIAGNOSIS — G8929 Other chronic pain: Secondary | ICD-10-CM

## 2016-04-08 DIAGNOSIS — S22000A Wedge compression fracture of unspecified thoracic vertebra, initial encounter for closed fracture: Secondary | ICD-10-CM | POA: Diagnosis not present

## 2016-04-08 DIAGNOSIS — M545 Low back pain: Secondary | ICD-10-CM | POA: Diagnosis not present

## 2016-04-10 ENCOUNTER — Telehealth: Payer: Self-pay | Admitting: Emergency Medicine

## 2016-04-10 MED ORDER — BUDESONIDE-FORMOTEROL FUMARATE 160-4.5 MCG/ACT IN AERO: 2.0000 | INHALATION_SPRAY | Freq: Two times a day (BID) | RESPIRATORY_TRACT | 5 refills | Status: DC

## 2016-04-10 NOTE — Telephone Encounter (Signed)
Spoke with pt. She is needing a rx sent in Symbicort. Rx has been sent in. Nothing further was needed.

## 2016-04-13 ENCOUNTER — Ambulatory Visit: Payer: Medicare Other | Admitting: Sports Medicine

## 2016-04-13 ENCOUNTER — Other Ambulatory Visit (HOSPITAL_COMMUNITY): Payer: Self-pay | Admitting: Family Medicine

## 2016-04-13 DIAGNOSIS — M5136 Other intervertebral disc degeneration, lumbar region: Secondary | ICD-10-CM

## 2016-04-13 DIAGNOSIS — M47817 Spondylosis without myelopathy or radiculopathy, lumbosacral region: Secondary | ICD-10-CM

## 2016-04-15 ENCOUNTER — Other Ambulatory Visit: Payer: Medicare Other

## 2016-04-15 ENCOUNTER — Encounter: Payer: Medicare Other | Admitting: Hematology & Oncology

## 2016-04-20 ENCOUNTER — Encounter (HOSPITAL_COMMUNITY): Payer: Medicare Other

## 2016-04-22 ENCOUNTER — Other Ambulatory Visit: Payer: Medicare Other

## 2016-04-22 ENCOUNTER — Ambulatory Visit: Payer: Medicare Other | Admitting: Hematology & Oncology

## 2016-04-23 ENCOUNTER — Encounter (HOSPITAL_COMMUNITY)
Admission: RE | Admit: 2016-04-23 | Discharge: 2016-04-23 | Disposition: A | Discharge status: Home / Self Care | Payer: Medicare Other | Source: Ambulatory Visit | Attending: Family Medicine | Admitting: Family Medicine

## 2016-04-23 DIAGNOSIS — M1288 Other specific arthropathies, not elsewhere classified, other specified site: Secondary | ICD-10-CM | POA: Diagnosis not present

## 2016-04-23 DIAGNOSIS — M1990 Unspecified osteoarthritis, unspecified site: Secondary | ICD-10-CM | POA: Diagnosis not present

## 2016-04-23 DIAGNOSIS — M5136 Other intervertebral disc degeneration, lumbar region: Secondary | ICD-10-CM

## 2016-04-23 DIAGNOSIS — M47817 Spondylosis without myelopathy or radiculopathy, lumbosacral region: Secondary | ICD-10-CM

## 2016-04-23 MED ORDER — TECHNETIUM TC 99M MEDRONATE IV KIT: 25.0000 | PACK | Freq: Once | INTRAVENOUS | Status: DC | PRN

## 2016-04-27 ENCOUNTER — Ambulatory Visit (HOSPITAL_BASED_OUTPATIENT_CLINIC_OR_DEPARTMENT_OTHER): Payer: Medicare Other

## 2016-04-27 ENCOUNTER — Ambulatory Visit (HOSPITAL_BASED_OUTPATIENT_CLINIC_OR_DEPARTMENT_OTHER)
Admission: RE | Admit: 2016-04-27 | Discharge: 2016-04-27 | Disposition: A | Discharge status: Home / Self Care | Payer: Medicare Other | Source: Ambulatory Visit | Attending: Family | Admitting: Family

## 2016-04-27 ENCOUNTER — Other Ambulatory Visit (HOSPITAL_BASED_OUTPATIENT_CLINIC_OR_DEPARTMENT_OTHER): Payer: Medicare Other

## 2016-04-27 ENCOUNTER — Ambulatory Visit (HOSPITAL_BASED_OUTPATIENT_CLINIC_OR_DEPARTMENT_OTHER): Payer: Medicare Other | Admitting: Family

## 2016-04-27 VITALS — BP 120/62 | HR 77 | Resp 16

## 2016-04-27 VITALS — BP 116/64 | HR 88 | Temp 97.6°F | Resp 20 | Wt 132.8 lb

## 2016-04-27 DIAGNOSIS — D508 Other iron deficiency anemias: Secondary | ICD-10-CM

## 2016-04-27 DIAGNOSIS — G713 Mitochondrial myopathy, not elsewhere classified: Secondary | ICD-10-CM

## 2016-04-27 DIAGNOSIS — R109 Unspecified abdominal pain: Secondary | ICD-10-CM

## 2016-04-27 DIAGNOSIS — D500 Iron deficiency anemia secondary to blood loss (chronic): Secondary | ICD-10-CM | POA: Diagnosis not present

## 2016-04-27 DIAGNOSIS — R93421 Abnormal radiologic findings on diagnostic imaging of right kidney: Secondary | ICD-10-CM | POA: Insufficient documentation

## 2016-04-27 DIAGNOSIS — D45 Polycythemia vera: Secondary | ICD-10-CM | POA: Diagnosis not present

## 2016-04-27 DIAGNOSIS — N134 Hydroureter: Secondary | ICD-10-CM | POA: Diagnosis not present

## 2016-04-27 DIAGNOSIS — D472 Monoclonal gammopathy: Secondary | ICD-10-CM | POA: Diagnosis not present

## 2016-04-27 DIAGNOSIS — K8020 Calculus of gallbladder without cholecystitis without obstruction: Secondary | ICD-10-CM | POA: Diagnosis not present

## 2016-04-27 LAB — CBC WITH DIFFERENTIAL (CANCER CENTER ONLY)
BASO#: 0 10*3/uL (ref 0.0–0.2)
BASO%: 0.4 % (ref 0.0–2.0)
EOS%: 1.9 % (ref 0.0–7.0)
Eosinophils Absolute: 0.2 10*3/uL (ref 0.0–0.5)
HCT: 48.2 % — ABNORMAL HIGH (ref 34.8–46.6)
HGB: 16.8 g/dL — ABNORMAL HIGH (ref 11.6–15.9)
LYMPH#: 1 10*3/uL (ref 0.9–3.3)
LYMPH%: 13.2 % — ABNORMAL LOW (ref 14.0–48.0)
MCH: 33.9 pg (ref 26.0–34.0)
MCHC: 34.9 g/dL (ref 32.0–36.0)
MCV: 97 fL (ref 81–101)
MONO#: 0.7 10*3/uL (ref 0.1–0.9)
MONO%: 9.1 % (ref 0.0–13.0)
NEUT#: 5.9 10*3/uL (ref 1.5–6.5)
NEUT%: 75.4 % (ref 39.6–80.0)
Platelets: 185 10*3/uL (ref 145–400)
RBC: 4.96 10*6/uL (ref 3.70–5.32)
RDW: 13.3 % (ref 11.1–15.7)
WBC: 7.8 10*3/uL (ref 3.9–10.0)

## 2016-04-27 LAB — COMPREHENSIVE METABOLIC PANEL (CC13)
ALT: 24 IU/L (ref 0–32)
AST (SGOT): 32 IU/L (ref 0–40)
Albumin, Serum: 3.8 g/dL (ref 3.5–4.8)
Albumin/Globulin Ratio: 1.2 (ref 1.2–2.2)
Alkaline Phosphatase, S: 93 IU/L (ref 39–117)
BUN/Creatinine Ratio: 21 (ref 12–28)
BUN: 12 mg/dL (ref 8–27)
Bilirubin Total: 0.6 mg/dL (ref 0.0–1.2)
Calcium, Ser: 9.5 mg/dL (ref 8.7–10.3)
Carbon Dioxide, Total: 23 mmol/L (ref 18–29)
Chloride, Ser: 101 mmol/L (ref 96–106)
Creatinine, Ser: 0.58 mg/dL (ref 0.57–1.00)
GFR calc Af Amer: 106 mL/min/{1.73_m2} (ref >59)
GFR calc non Af Amer: 92 mL/min/{1.73_m2} (ref >59)
Globulin, Total: 3.2 g/dL (ref 1.5–4.5)
Glucose: 102 mg/dL — ABNORMAL HIGH (ref 65–99)
Potassium, Ser: 4 mmol/L (ref 3.5–5.2)
Sodium: 131 mmol/L — ABNORMAL LOW (ref 134–144)
Total Protein: 7 g/dL (ref 6.0–8.5)

## 2016-04-27 MED ORDER — SODIUM CHLORIDE 0.9 % IV SOLN
INTRAVENOUS | Status: DC
Start: 2016-04-27 — End: 2016-04-27
  Administered 2016-04-27: 16:00:00 via INTRAVENOUS

## 2016-04-27 NOTE — Progress Notes (Signed)
Hematology and Oncology Follow Up Visit  [name] [id number] 1945/03/16 71 y.o. 04/27/2016   Principle Diagnosis:  1. 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 Phlebotomy to maintain hematocrit below 45% 2. Aspirin 81 mg p.o. Daily. 3. IV iron as indicated - last received Dec/Jan x 2    Interim History:  [name] is here today with her husband for follow-up. She is having a great deal of back pain due to a the compression fracture at T11 as well as previous compression fracture at L2. She is only able to lay flat in bed and getting up to go to the bathroom is extremely painful. She states that she does not want to take pain medicine because it makes her have bad dreams and hallucinations. She has an appointment with Dr. Lynann Bologna on Monday next week to discuss treatment options for her back. We will contact his office to enquire about a possible kyphoplasty with biopsy.  No swelling, tenderness, numbness or tingling in her extremities.  She had a mini phlebotomy in February for Hct of 45.2. Hct today is 48.2 so we will proceed with  Phlebotomy followed by fluids.  Her bone scan also showed a possible right hydroureter and since she is having some right flank pain we will go ahead and get an renal.  No fever, chills, ice cravings, vomiting, cough, rash, chest pain, abdominal pain or changes in bowel or bladder habits. She has IBS and fluctuates between constipation and diarrhea. She has had no episodes of bleeding. She takes one baby aspirin daily and does bruise easily.  She has maintained a good appetite and is staying hydrated. Her weight is stable.    Medications:  Allergies as of 04/27/2016      Reactions   Dulera [mometasone Furo-formoterol Fum] Cough      Medication List       Accurate as of 04/27/16  4:22 PM.  Always use your most recent med list.          albuterol 108 (90 Base) MCG/ACT inhaler Commonly known as:  PROVENTIL HFA;VENTOLIN HFA Inhale 2 puffs into the lungs every 4 (four) hours as needed for wheezing or shortness of breath.   aspirin 81 MG tablet Take 81 mg by mouth daily.   budesonide-formoterol 160-4.5 MCG/ACT inhaler Commonly known as:  SYMBICORT Inhale 2 puffs into the lungs 2 (two) times daily.   CLEAR SOLUBLE FIBER Powd Take 5 mLs by mouth every morning.   escitalopram 5 MG tablet Commonly known as:  LEXAPRO 5 mg.   HYDROmorphone 4 MG tablet Commonly known as:  DILAUDID take 1/2 to 1 tablet by mouth every 6 hours if needed for pain   L-Carnitine 500 MG Caps Take 2 capsules (1,000 mg total) by mouth daily.   LOPREEZA 0.5-0.1 MG tablet Generic drug:  Estradiol-Norethindrone Acet Take 1 tablet by mouth daily.   LORazepam 0.5 MG tablet Commonly known as:  ATIVAN TAKE 1 TABLET BY MOUTH EVERY NIGHT AT BEDTIME   Melatonin 3 MG Tabs Take 3 mg by mouth at bedtime.   pantoprazole 40 MG tablet Commonly known as:  PROTONIX Take 40 mg by mouth daily at 8 pm.   Vitamin D (Ergocalciferol) 50000 units Caps capsule Commonly known as:  DRISDOL Take 50,000 Units by mouth every Sunday.       Allergies:  Allergies  Allergen  Reactions  . Dulera [Mometasone Furo-Formoterol Fum] Cough    Past Medical History, Surgical history, Social history, and Family History were reviewed and updated.  Review of Systems: All other 10 point review of systems is negative.   Physical Exam:  weight is 132 lb 12.8 oz (60.2 kg). Her oral temperature is 97.6 F (36.4 C). Her blood pressure is 116/64 and her pulse is 88. Her respiration is 20 and oxygen saturation is 98%.   Wt Readings from Last 3 Encounters:  04/27/16 132 lb 12.8 oz (60.2 kg)  03/23/16 138 lb (62.6 kg)  03/16/16 138 lb (62.6 kg)    Ocular: Sclerae unicteric, pupils equal, round and reactive to  light Ear-nose-throat: Oropharynx clear, dentition fair Lymphatic: No cervical supraclavicular or axillary adenopathy Lungs no rales or rhonchi, good excursion bilaterally Heart regular rate and rhythm, no murmur appreciated Abd soft, nontender, positive bowel sounds, no liver or spleen tip palpated on exam, no fluid wave MSK no focal spinal tenderness, no joint edema Neuro: non-focal, well-oriented, appropriate affect Breasts: Deferred  Lab Results  Component Value Date   WBC 7.8 04/27/2016   HGB 16.8 (H) 04/27/2016   HCT 48.2 (H) 04/27/2016   MCV 97 04/27/2016   PLT 185 04/27/2016   Lab Results  Component Value Date   FERRITIN 971 (H) 03/04/2016   IRON 169 (H) 03/04/2016   TIBC 231 (L) 03/04/2016   UIBC 63 (L) 03/04/2016   IRONPCTSAT 73 (H) 03/04/2016   Lab Results  Component Value Date   RETICCTPCT 1.5 06/12/2014   RBC 4.96 04/27/2016   RETICCTABS 65.9 06/12/2014   No results found for: [facility] P[facility] Lab Results  Component Value Date   IGGSERUM 979 02/27/2014   IGA 339 02/27/2014   IGMSERUM 129 02/27/2014   No results found for: Odetta Pink, SPEI   Chemistry      Component Value Date/Time   NA 135 03/04/2016 1427   NA 138 01/17/2016 1356   K 4.0 03/04/2016 1427   K 4.1 01/17/2016 1356   CL 100 03/04/2016 1427   CL 104 09/24/2015 1151   CO2 24 03/04/2016 1427   CO2 25 01/17/2016 1356   BUN 12 03/04/2016 1427   BUN 12.3 01/17/2016 1356   CREATININE 0.56 (L) 03/04/2016 1427   CREATININE 0.8 01/17/2016 1356      Component Value Date/Time   CALCIUM 8.7 03/04/2016 1427   CALCIUM 9.1 01/17/2016 1356   ALKPHOS 71 03/04/2016 1427   ALKPHOS 87 01/17/2016 1356   AST 40 03/04/2016 1427   AST 48 (H) 01/17/2016 1356   ALT 32 03/04/2016 1427   ALT 38 01/17/2016 1356   BILITOT 0.6 03/04/2016 1427   BILITOT 0.67 01/17/2016 1356     Impression and Plan: [name] is 71 yo female with  both hemochromatosis and polycythemia. She also has iron deficiency due to frequent phlebotomies. Her problem at this time is debilitating back pain due to compression fractures of the thoracic and lumbar spine. She has an appointment to see Dr. Lynann Bologna next week on Monday to discuss treatment options. We will contact him regarding a biopsy during possible kyphoplasty or laminectomy.  Her Hct is 48.2 so we will proceed with a phlebotomy and replacement fluids.   We will also arrange for her to have a renal US to assess for right hydroureter.  We will go ahead and plan to see her back in 6 weeks for repeat lab work and  follow-up.  Both she and her husband know to contact our office with any questions or concerns. We can certainly see her sooner if need be.   Eliezer Bottom, NP 4/2/20184:22 PM

## 2016-04-28 ENCOUNTER — Telehealth: Payer: Self-pay | Admitting: Family

## 2016-04-28 LAB — LACTATE DEHYDROGENASE: LDH: 189 U/L (ref 125–245)

## 2016-04-28 LAB — IGG, IGA, IGM
IgA, Qn, Serum: 414 mg/dL (ref 64–422)
IgG, Qn, Serum: 897 mg/dL (ref 700–1600)
IgM, Qn, Serum: 132 mg/dL (ref 26–217)

## 2016-04-28 LAB — FERRITIN: Ferritin: 504 ng/ml — ABNORMAL HIGH (ref 9–269)

## 2016-04-28 LAB — BETA 2 MICROGLOBULIN, SERUM: Beta-2: 1.7 mg/L (ref 0.6–2.4)

## 2016-04-28 LAB — KAPPA/LAMBDA LIGHT CHAINS
Ig Kappa Free Light Chain: 16.9 mg/L (ref 3.3–19.4)
Ig Lambda Free Light Chain: 18.5 mg/L (ref 5.7–26.3)
Kappa/Lambda FluidC Ratio: 0.91 (ref 0.26–1.65)

## 2016-04-28 LAB — IRON AND TIBC
%SAT: 46 % (ref 21–57)
Iron: 104 ug/dL (ref 41–142)
TIBC: 228 ug/dL — ABNORMAL LOW (ref 236–444)
UIBC: 123 ug/dL (ref 120–384)

## 2016-04-28 NOTE — Telephone Encounter (Signed)
I spoke with Ms. Brandenburger over the phone and discussed her [place] results. All questions were answered. Also gave her lab results for all lab work draw yesterday except for SPEP which is still pending.

## 2016-04-29 ENCOUNTER — Telehealth: Payer: Self-pay

## 2016-04-29 LAB — PROTEIN ELECTROPHORESIS, SERUM, WITH REFLEX
A/G Ratio: 1.2 (ref 0.7–1.7)
Albumin: 3.6 g/dL (ref 2.9–4.4)
Alpha 1: 0.3 g/dL (ref 0.0–0.4)
Alpha 2: 0.6 g/dL (ref 0.4–1.0)
Beta: 1.1 g/dL (ref 0.7–1.3)
Gamma Globulin: 1 g/dL (ref 0.4–1.8)
Globulin, Total: 3 g/dL (ref 2.2–3.9)
Total Protein: 6.6 g/dL (ref 6.0–8.5)

## 2016-04-29 NOTE — Telephone Encounter (Addendum)
-----   Message from [name], MD sent at 04/29/2016  3:55 PM EDT ----- Call - I cannot find any myeloma in the blood!!!  [name]  Above message provided to patient by phone. Patient verbalized understanding.

## 2016-05-04 DIAGNOSIS — M546 Pain in thoracic spine: Secondary | ICD-10-CM | POA: Diagnosis not present

## 2016-05-04 NOTE — Progress Notes (Signed)
This encounter was created in error - please disregard.

## 2016-05-07 ENCOUNTER — Ambulatory Visit: Payer: Medicare Other | Admitting: Emergency Medicine

## 2016-05-11 ENCOUNTER — Other Ambulatory Visit: Payer: Self-pay | Admitting: Orthopedic Surgery

## 2016-05-13 ENCOUNTER — Other Ambulatory Visit: Payer: Self-pay | Admitting: Family

## 2016-05-13 NOTE — Progress Notes (Signed)
Unable to complete pt assessments; please complete on DOS. Pre-op instructions were given to pt spouse, [name] as verbally requested by pt.  Spouse made aware to have pt stop taking vitamins, fish oil and herbal medications. Do not take any NSAIDs ie: Ibuprofen, Advil, Naproxen, BC and Goody Powder or any medication containing Aspirin. Spouse stated that pt has " an EKG slip that was given to her by the doctor that she will bring in." Spouse verbalized understanding of all pre-op instructions.

## 2016-05-14 ENCOUNTER — Ambulatory Visit (HOSPITAL_COMMUNITY): Payer: Medicare Other | Admitting: Anesthesiology

## 2016-05-14 ENCOUNTER — Encounter (HOSPITAL_COMMUNITY): Payer: Self-pay | Admitting: *Deleted

## 2016-05-14 ENCOUNTER — Ambulatory Visit (HOSPITAL_COMMUNITY): Payer: Medicare Other

## 2016-05-14 ENCOUNTER — Ambulatory Visit (HOSPITAL_COMMUNITY)
Admission: RE | Admit: 2016-05-14 | Discharge: 2016-05-14 | Disposition: A | Discharge status: Home / Self Care | Payer: Medicare Other | Source: Ambulatory Visit | Attending: Orthopedic Surgery | Admitting: Orthopedic Surgery

## 2016-05-14 ENCOUNTER — Encounter (HOSPITAL_COMMUNITY)
Admission: RE | Disposition: A | Discharge status: Home / Self Care | Payer: Self-pay | Source: Ambulatory Visit | Attending: Orthopedic Surgery

## 2016-05-14 DIAGNOSIS — N189 Chronic kidney disease, unspecified: Secondary | ICD-10-CM | POA: Diagnosis not present

## 2016-05-14 DIAGNOSIS — E559 Vitamin D deficiency, unspecified: Secondary | ICD-10-CM | POA: Diagnosis not present

## 2016-05-14 DIAGNOSIS — F419 Anxiety disorder, unspecified: Secondary | ICD-10-CM | POA: Diagnosis not present

## 2016-05-14 DIAGNOSIS — J45909 Unspecified asthma, uncomplicated: Secondary | ICD-10-CM | POA: Insufficient documentation

## 2016-05-14 DIAGNOSIS — Z462 Encounter for fitting and adjustment of other devices related to nervous system and special senses: Secondary | ICD-10-CM | POA: Diagnosis not present

## 2016-05-14 DIAGNOSIS — D45 Polycythemia vera: Secondary | ICD-10-CM | POA: Diagnosis not present

## 2016-05-14 DIAGNOSIS — Z79899 Other long term (current) drug therapy: Secondary | ICD-10-CM | POA: Insufficient documentation

## 2016-05-14 DIAGNOSIS — Z419 Encounter for procedure for purposes other than remedying health state, unspecified: Secondary | ICD-10-CM

## 2016-05-14 DIAGNOSIS — Z01818 Encounter for other preprocedural examination: Secondary | ICD-10-CM

## 2016-05-14 DIAGNOSIS — Z7951 Long term (current) use of inhaled steroids: Secondary | ICD-10-CM | POA: Diagnosis not present

## 2016-05-14 DIAGNOSIS — M4854XA Collapsed vertebra, not elsewhere classified, thoracic region, initial encounter for fracture: Secondary | ICD-10-CM | POA: Diagnosis not present

## 2016-05-14 DIAGNOSIS — S22000G Wedge compression fracture of unspecified thoracic vertebra, subsequent encounter for fracture with delayed healing: Secondary | ICD-10-CM

## 2016-05-14 DIAGNOSIS — M810 Age-related osteoporosis without current pathological fracture: Secondary | ICD-10-CM | POA: Diagnosis not present

## 2016-05-14 DIAGNOSIS — Z8709 Personal history of other diseases of the respiratory system: Secondary | ICD-10-CM | POA: Diagnosis not present

## 2016-05-14 DIAGNOSIS — J449 Chronic obstructive pulmonary disease, unspecified: Secondary | ICD-10-CM | POA: Diagnosis not present

## 2016-05-14 DIAGNOSIS — M545 Low back pain: Secondary | ICD-10-CM | POA: Diagnosis not present

## 2016-05-14 DIAGNOSIS — Z7982 Long term (current) use of aspirin: Secondary | ICD-10-CM | POA: Diagnosis not present

## 2016-05-14 DIAGNOSIS — R079 Chest pain, unspecified: Secondary | ICD-10-CM | POA: Diagnosis not present

## 2016-05-14 DIAGNOSIS — S22080A Wedge compression fracture of T11-T12 vertebra, initial encounter for closed fracture: Secondary | ICD-10-CM | POA: Diagnosis not present

## 2016-05-14 HISTORY — DX: Gastro-esophageal reflux disease without esophagitis: K21.9

## 2016-05-14 HISTORY — DX: Dyspnea, unspecified: R06.00

## 2016-05-14 HISTORY — DX: Depression, unspecified: F32.A

## 2016-05-14 HISTORY — PX: KYPHOPLASTY: SHX5884

## 2016-05-14 HISTORY — DX: Cardiac arrhythmia, unspecified: I49.9

## 2016-05-14 HISTORY — DX: Major depressive disorder, single episode, unspecified: F32.9

## 2016-05-14 LAB — CBC WITH DIFFERENTIAL/PLATELET
Basophils Absolute: 0 10*3/uL (ref 0.0–0.1)
Basophils Relative: 0 %
Eosinophils Absolute: 0.1 10*3/uL (ref 0.0–0.7)
Eosinophils Relative: 1 %
HCT: 46.9 % — ABNORMAL HIGH (ref 36.0–46.0)
Hemoglobin: 15.9 g/dL — ABNORMAL HIGH (ref 12.0–15.0)
Lymphocytes Relative: 17 %
Lymphs Abs: 0.9 10*3/uL (ref 0.7–4.0)
MCH: 34 pg (ref 26.0–34.0)
MCHC: 33.9 g/dL (ref 30.0–36.0)
MCV: 100.4 fL — ABNORMAL HIGH (ref 78.0–100.0)
Monocytes Absolute: 0.5 10*3/uL (ref 0.1–1.0)
Monocytes Relative: 9 %
Neutro Abs: 4 10*3/uL (ref 1.7–7.7)
Neutrophils Relative %: 73 %
Platelets: 170 10*3/uL (ref 150–400)
RBC: 4.67 MIL/uL (ref 3.87–5.11)
RDW: 14.8 % (ref 11.5–15.5)
WBC: 5.5 10*3/uL (ref 4.0–10.5)

## 2016-05-14 LAB — URINALYSIS, ROUTINE W REFLEX MICROSCOPIC
Bacteria, UA: NONE SEEN
Bilirubin Urine: NEGATIVE
Glucose, UA: NEGATIVE mg/dL
Hgb urine dipstick: NEGATIVE
Ketones, ur: NEGATIVE mg/dL
Nitrite: NEGATIVE
Protein, ur: NEGATIVE mg/dL
Specific Gravity, Urine: 1.024 (ref 1.005–1.030)
pH: 5 (ref 5.0–8.0)

## 2016-05-14 LAB — COMPREHENSIVE METABOLIC PANEL
ALT: 20 U/L (ref 14–54)
AST: 30 U/L (ref 15–41)
Albumin: 3.5 g/dL (ref 3.5–5.0)
Alkaline Phosphatase: 75 U/L (ref 38–126)
Anion gap: 11 (ref 5–15)
BUN: 12 mg/dL (ref 6–20)
CO2: 20 mmol/L — ABNORMAL LOW (ref 22–32)
Calcium: 8.9 mg/dL (ref 8.9–10.3)
Chloride: 109 mmol/L (ref 101–111)
Creatinine, Ser: 0.59 mg/dL (ref 0.44–1.00)
GFR calc Af Amer: >60 mL/min (ref >60)
GFR calc non Af Amer: >60 mL/min (ref >60)
Glucose, Bld: 90 mg/dL (ref 65–99)
Potassium: 3.8 mmol/L (ref 3.5–5.1)
Sodium: 140 mmol/L (ref 135–145)
Total Bilirubin: 1.1 mg/dL (ref 0.3–1.2)
Total Protein: 6.7 g/dL (ref 6.5–8.1)

## 2016-05-14 LAB — PROTIME-INR
INR: 1
Prothrombin Time: 13.2 seconds (ref 11.4–15.2)

## 2016-05-14 LAB — APTT: aPTT: 27 seconds (ref 24–36)

## 2016-05-14 SURGERY — KYPHOPLASTY
Anesthesia: General

## 2016-05-14 MED ORDER — FENTANYL CITRATE (PF) 100 MCG/2ML IJ SOLN
25.0000 ug | INTRAMUSCULAR | Status: DC | PRN
  Administered 2016-05-14: 25 ug via INTRAVENOUS

## 2016-05-14 MED ORDER — MIDAZOLAM HCL 2 MG/2ML IJ SOLN
INTRAMUSCULAR | Status: AC
  Filled 2016-05-14: qty 2

## 2016-05-14 MED ORDER — PROPOFOL 10 MG/ML IV BOLUS
INTRAVENOUS | Status: DC | PRN
Start: 2016-05-14 — End: 2016-05-14
  Administered 2016-05-14: 120 mg via INTRAVENOUS

## 2016-05-14 MED ORDER — BUPIVACAINE LIPOSOME 1.3 % IJ SUSP
20.0000 mL | INTRAMUSCULAR | Status: DC
  Filled 2016-05-14: qty 20

## 2016-05-14 MED ORDER — IOPAMIDOL (ISOVUE-300) INJECTION 61%
INTRAVENOUS | Status: AC
  Filled 2016-05-14: qty 50

## 2016-05-14 MED ORDER — LIDOCAINE 2% (20 MG/ML) 5 ML SYRINGE
INTRAMUSCULAR | Status: AC
  Filled 2016-05-14: qty 5

## 2016-05-14 MED ORDER — LACTATED RINGERS IV SOLN
INTRAVENOUS | Status: DC
  Administered 2016-05-14: 50 mL/h via INTRAVENOUS

## 2016-05-14 MED ORDER — 0.9 % SODIUM CHLORIDE (POUR BTL) OPTIME
TOPICAL | Status: DC | PRN
  Administered 2016-05-14: 1000 mL

## 2016-05-14 MED ORDER — BUPIVACAINE LIPOSOME 1.3 % IJ SUSP
INTRAMUSCULAR | Status: DC | PRN
  Administered 2016-05-14: 20 mL

## 2016-05-14 MED ORDER — LIDOCAINE HCL (CARDIAC) 20 MG/ML IV SOLN
INTRAVENOUS | Status: DC | PRN
  Administered 2016-05-14: 60 mg via INTRAVENOUS

## 2016-05-14 MED ORDER — BUPIVACAINE HCL (PF) 0.25 % IJ SOLN
INTRAMUSCULAR | Status: AC
  Filled 2016-05-14: qty 30

## 2016-05-14 MED ORDER — ONDANSETRON HCL 4 MG/2ML IJ SOLN
INTRAMUSCULAR | Status: AC
  Filled 2016-05-14: qty 2

## 2016-05-14 MED ORDER — POVIDONE-IODINE 7.5 % EX SOLN
Freq: Once | CUTANEOUS | Status: DC
  Filled 2016-05-14: qty 118

## 2016-05-14 MED ORDER — ROCURONIUM BROMIDE 10 MG/ML (PF) SYRINGE
PREFILLED_SYRINGE | INTRAVENOUS | Status: AC
  Filled 2016-05-14: qty 5

## 2016-05-14 MED ORDER — EPINEPHRINE PF 1 MG/ML IJ SOLN
INTRAMUSCULAR | Status: AC
  Filled 2016-05-14: qty 1

## 2016-05-14 MED ORDER — MIDAZOLAM HCL 5 MG/5ML IJ SOLN
INTRAMUSCULAR | Status: DC | PRN
  Administered 2016-05-14: 2 mg via INTRAVENOUS

## 2016-05-14 MED ORDER — BACITRACIN ZINC 500 UNIT/GM EX OINT
TOPICAL_OINTMENT | CUTANEOUS | Status: AC
  Filled 2016-05-14: qty 28.35

## 2016-05-14 MED ORDER — SUGAMMADEX SODIUM 200 MG/2ML IV SOLN
INTRAVENOUS | Status: DC | PRN
  Administered 2016-05-14: 150 mg via INTRAVENOUS

## 2016-05-14 MED ORDER — BUPIVACAINE-EPINEPHRINE 0.25% -1:200000 IJ SOLN
INTRAMUSCULAR | Status: DC | PRN
  Administered 2016-05-14: 20 mL

## 2016-05-14 MED ORDER — FENTANYL CITRATE (PF) 250 MCG/5ML IJ SOLN
INTRAMUSCULAR | Status: AC
  Filled 2016-05-14: qty 5

## 2016-05-14 MED ORDER — FENTANYL CITRATE (PF) 100 MCG/2ML IJ SOLN
INTRAMUSCULAR | Status: AC
  Filled 2016-05-14: qty 2

## 2016-05-14 MED ORDER — FENTANYL CITRATE (PF) 100 MCG/2ML IJ SOLN
INTRAMUSCULAR | Status: DC | PRN
  Administered 2016-05-14: 150 ug via INTRAVENOUS

## 2016-05-14 MED ORDER — ROCURONIUM BROMIDE 100 MG/10ML IV SOLN
INTRAVENOUS | Status: DC | PRN
  Administered 2016-05-14: 30 mg via INTRAVENOUS

## 2016-05-14 MED ORDER — CEFAZOLIN SODIUM-DEXTROSE 2-4 GM/100ML-% IV SOLN
2.0000 g | INTRAVENOUS | Status: AC
  Administered 2016-05-14: 2 g via INTRAVENOUS
  Filled 2016-05-14: qty 100

## 2016-05-14 MED ORDER — IOPAMIDOL (ISOVUE-300) INJECTION 61%
INTRAVENOUS | Status: DC | PRN
  Administered 2016-05-14: 50 mL

## 2016-05-14 MED ORDER — SUGAMMADEX SODIUM 200 MG/2ML IV SOLN
INTRAVENOUS | Status: AC
  Filled 2016-05-14: qty 2

## 2016-05-14 SURGICAL SUPPLY — 42 items
BLADE SURG 15 STRL LF DISP TIS (BLADE) ×1 IMPLANT
BLADE SURG 15 STRL SS (BLADE) ×3
COVER MAYO STAND STRL (DRAPES) ×3 IMPLANT
COVER SURGICAL LIGHT HANDLE (MISCELLANEOUS) ×3 IMPLANT
CURETTE EXPRESS SZ2 7MM (INSTRUMENTS) IMPLANT
CURRETTE EXPRESS SZ2 7MM (INSTRUMENTS)
DRAPE C-ARM 42X72 X-RAY (DRAPES) ×3 IMPLANT
DRAPE HALF SHEET 40X57 (DRAPES) IMPLANT
DRAPE INCISE IOBAN 66X45 STRL (DRAPES) ×3 IMPLANT
DRAPE LAPAROTOMY T 102X78X121 (DRAPES) ×5 IMPLANT
DRAPE SURG 17X23 STRL (DRAPES) ×12 IMPLANT
DURAPREP 26ML APPLICATOR (WOUND CARE) ×3 IMPLANT
GAUZE SPONGE 4X4 16PLY XRAY LF (GAUZE/BANDAGES/DRESSINGS) ×3 IMPLANT
GLOVE BIO SURGEON STRL SZ7 (GLOVE) ×5 IMPLANT
GLOVE BIO SURGEON STRL SZ8 (GLOVE) ×3 IMPLANT
GLOVE BIOGEL PI IND STRL 7.0 (GLOVE) ×1 IMPLANT
GLOVE BIOGEL PI IND STRL 8 (GLOVE) ×1 IMPLANT
GLOVE BIOGEL PI INDICATOR 7.0 (GLOVE) ×2
GLOVE BIOGEL PI INDICATOR 8 (GLOVE) ×2
GOWN STRL REUS W/ TWL LRG LVL3 (GOWN DISPOSABLE) ×2 IMPLANT
GOWN STRL REUS W/ TWL XL LVL3 (GOWN DISPOSABLE) ×1 IMPLANT
GOWN STRL REUS W/TWL LRG LVL3 (GOWN DISPOSABLE) ×6
GOWN STRL REUS W/TWL XL LVL3 (GOWN DISPOSABLE) ×3
KIT BASIN OR (CUSTOM PROCEDURE TRAY) ×3 IMPLANT
KIT ROOM TURNOVER OR (KITS) ×3 IMPLANT
NDL HYPO 25X1 1.5 SAFETY (NEEDLE) IMPLANT
NDL SPNL 18GX3.5 QUINCKE PK (NEEDLE) ×2 IMPLANT
NEEDLE 22X1 1/2 (OR ONLY) (NEEDLE) IMPLANT
NEEDLE HYPO 25X1 1.5 SAFETY (NEEDLE) IMPLANT
NEEDLE SPNL 18GX3.5 QUINCKE PK (NEEDLE) ×6 IMPLANT
NS IRRIG 1000ML POUR BTL (IV SOLUTION) ×3 IMPLANT
PACK UNIVERSAL I (CUSTOM PROCEDURE TRAY) ×3 IMPLANT
PAD ARMBOARD 7.5X6 YLW CONV (MISCELLANEOUS) ×6 IMPLANT
POSITIONER HEAD PRONE TRACH (MISCELLANEOUS) ×3 IMPLANT
SUT MNCRL AB 4-0 PS2 18 (SUTURE) ×3 IMPLANT
SYR BULB IRRIGATION 50ML (SYRINGE) ×3 IMPLANT
SYR CONTROL 10ML LL (SYRINGE) ×3 IMPLANT
TAPE CLOTH SURG 4X10 WHT LF (GAUZE/BANDAGES/DRESSINGS) ×2 IMPLANT
TOWEL OR 17X24 6PK STRL BLUE (TOWEL DISPOSABLE) ×3 IMPLANT
TOWEL OR 17X26 10 PK STRL BLUE (TOWEL DISPOSABLE) ×3 IMPLANT
TOWEL OR NON WOVEN STRL DISP B (DISPOSABLE) ×2 IMPLANT
TRAY KYPHOPAK 15/3 ONESTEP 1ST (MISCELLANEOUS) IMPLANT

## 2016-05-14 NOTE — Anesthesia Preprocedure Evaluation (Signed)
Anesthesia Evaluation  Patient identified by MRN, date of birth, ID band Patient awake    Reviewed: Allergy & Precautions, NPO status , Patient's Chart, lab work & pertinent test results  Airway Mallampati: II  TM Distance: >3 FB     Dental   Pulmonary shortness of breath, asthma , COPD,    breath sounds clear to auscultation       Cardiovascular + Past MI and + DOE  + dysrhythmias  Rhythm:Regular Rate:Normal     Neuro/Psych  Headaches,    GI/Hepatic Neg liver ROS, GERD  ,  Endo/Other  negative endocrine ROS  Renal/GU Renal disease     Musculoskeletal   Abdominal   Peds  Hematology  (+) anemia ,   Anesthesia Other Findings   Reproductive/Obstetrics                             Anesthesia Physical Anesthesia Plan  ASA: III  Anesthesia Plan: General   Post-op Pain Management:    Induction: Intravenous  Airway Management Planned: Oral ETT  Additional Equipment:   Intra-op Plan:   Post-operative Plan: Possible Post-op intubation/ventilation  Informed Consent: I have reviewed the patients History and Physical, chart, labs and discussed the procedure including the risks, benefits and alternatives for the proposed anesthesia with the patient or authorized representative who has indicated his/her understanding and acceptance.   Dental advisory given  Plan Discussed with: CRNA and Anesthesiologist  Anesthesia Plan Comments:         Anesthesia Quick Evaluation

## 2016-05-14 NOTE — Anesthesia Postprocedure Evaluation (Signed)
Anesthesia Post Note  Patient: [name]  Procedure(s) Performed: Procedure(s) (LRB): T 11 KYPHOPLASTY (N/A)  Patient location during evaluation: PACU Anesthesia Type: General Level of consciousness: awake Pain management: pain level controlled Vital Signs Assessment: post-procedure vital signs reviewed and stable Respiratory status: spontaneous breathing Cardiovascular status: stable Anesthetic complications: no       Last Vitals:  Vitals:   05/14/16 1530 05/14/16 1540  BP:  (!) 121/56  Pulse: 73 78  Resp: 16 15  Temp:  36.3 C    Last Pain:  Vitals:   05/14/16 1540  TempSrc:   PainSc: 0-No pain                 Kathleen Tamm

## 2016-05-14 NOTE — Op Note (Deleted)
  The note originally documented on this encounter has been moved the the encounter in which it belongs.  

## 2016-05-14 NOTE — Anesthesia Procedure Notes (Signed)
Procedure Name: Intubation Date/Time: 05/14/2016 1:15 PM Performed by: Myna Bright Pre-anesthesia Checklist: Emergency Drugs available, Patient identified, Suction available and Patient being monitored Patient Re-evaluated:Patient Re-evaluated prior to inductionOxygen Delivery Method: Circle system utilized Preoxygenation: Pre-oxygenation with 100% oxygen Intubation Type: IV induction Ventilation: Mask ventilation without difficulty Laryngoscope Size: Mac and 3 Grade View: Grade II Tube type: Oral Tube size: 7.0 mm Number of attempts: 1 Airway Equipment and Method: Stylet Placement Confirmation: ETT inserted through vocal cords under direct vision,  positive ETCO2 and breath sounds checked- equal and bilateral Secured at: 21 cm Tube secured with: Tape Dental Injury: Teeth and Oropharynx as per pre-operative assessment

## 2016-05-14 NOTE — H&P (Signed)
PREOPERATIVE H&P  Chief Complaint: Mid back pain  HPI: [name] is a 71 y.o. female who presents with ongoing pain in the mid back  MRI reveals a compression fracture at T11, potentially pathologic  Patient has failed multiple forms of conservative care and continues to have pain (see office notes for additional details regarding the patient's full course of treatment)  Past Medical History:  Diagnosis Date  . Allergy   . Anemia   . Anxiety   . Asthma   . Blood transfusion without reported diagnosis   . Cholelithiasis   . Chronic kidney disease    gallstones   . Heart attack   . Hemorrhoids   . IBS (irritable bowel syndrome)   . Iliotibial band syndrome    left knee  . Migraine   . Mitochondrial myopathy   . Normal coronary arteries    by cardiac catheterization performed by myself 02/25/01  . Osteoporosis   . Pericarditis    age 2  . Pneumothorax   . 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 PVC's (premature ventricular contractions)   . Vitamin D deficiency    Past Surgical History:  Procedure Laterality Date  . APPENDECTOMY    . CARDIAC CATHETERIZATION    . CESAREAN SECTION     x 4  . COLONOSCOPY    . DILATATION & CURETTAGE/HYSTEROSCOPY WITH MYOSURE N/A 10/29/2014   Procedure: DILATATION & CURETTAGE/HYSTEROSCOPY WITH MYOSURE;  Surgeon: Paula Compton, MD;  Location: [facility];  Service: Gynecology;  Laterality: N/A;  . MOUTH SURGERY    . NASAL SINUS SURGERY    . NM MYOCAR PERF WALL MOTION  04/27/2006   normal  . US ECHOCARDIOGRAPHY  12/25/2010   normal  . VEIN SURGERY     Social History   Social History  . Marital status: Married    Spouse name: Richard  . Number of children: 2  . Years of education: Grad   Occupational History  . Psychologist, educational Retired   Social History Main Topics  . Smoking status: Never Smoker  . Smokeless tobacco: Never Used     Comment: never used tobacco  . Alcohol use 1.2 oz/week    2 Glasses of wine per  week     Comment: occasionally  . Drug use: No  . Sexual activity: Not on file   Other Topics Concern  . Not on file   Social History Narrative   Health Care POA:    Emergency Contact: husband, Francene Finders, (c) [phone]   End of Life Plan:    Who lives with you: husband   Any pets: none   Diet: Pt has a varied diet.  Eats 5 sm. meals throughout day, focuses on protein, doesn't care for fruits and vegetables very much.   Exercise: Pt has a personal training and exercises several times a week.   Seatbelts: Pt reports wearing seatbelt when in vehicle.   Nancy Fetter Exposure/Protection: Pt reports wearing sun protection.    Hobbies: reading, visiting with friends   Patient has a Scientist, water quality.   Patient has two children.   Patient is retired.   Patient does not drink any caffeine.   Patient is right handed.            Family History  Problem Relation Age of Onset  . Asthma Mother   . Arthritis Mother   . Depression Mother   . Cancer Father   . Emphysema Father   . Stroke Father   .  Alcohol abuse Father   . Heart attack Father   . Colon cancer Neg Hx    Allergies  Allergen Reactions  . Dulera [Mometasone Furo-Formoterol Fum] Cough   Prior to Admission medications   Medication Sig Start Date End Date Taking? Authorizing Provider  albuterol (PROVENTIL HFA;VENTOLIN HFA) 108 (90 Base) MCG/ACT inhaler Inhale 2 puffs into the lungs every 4 (four) hours as needed for wheezing or shortness of breath. 01/15/16  Yes Collene Gobble, MD  aspirin 81 MG tablet Take 81 mg by mouth daily.   Yes Historical Provider, MD  budesonide-formoterol (SYMBICORT) 160-4.5 MCG/ACT inhaler Inhale 2 puffs into the lungs 2 (two) times daily. 04/10/16  Yes Collene Gobble, MD  Estradiol-Norethindrone Acet (LOPREEZA) 0.5-0.1 MG per tablet Take 1 tablet by mouth daily.   Yes Historical Provider, MD  ibuprofen (ADVIL,MOTRIN) 200 MG tablet Take 200 mg by mouth daily as needed for headache.   Yes Historical  Provider, MD  LORazepam (ATIVAN) 0.5 MG tablet TAKE 1 TABLET BY MOUTH EVERY NIGHT AT BEDTIME 05/01/14  Yes Leandrew Koyanagi, MD  pantoprazole (PROTONIX) 40 MG tablet Take 40 mg by mouth daily at 8 pm. 11/08/15  Yes Historical Provider, MD  sertraline (ZOLOFT) 100 MG tablet Take 100 mg by mouth at bedtime.  03/09/16  Yes Historical Provider, MD  Vitamin D, Ergocalciferol, (DRISDOL) 50000 UNITS CAPS capsule Take 50,000 Units by mouth every 7 (seven) days. Sundays   Yes Historical Provider, MD  LevOCARNitine L-Tartrate (L-CARNITINE) 500 MG CAPS Take 2 capsules (1,000 mg total) by mouth daily. Patient not taking: Reported on 05/13/2016 11/07/13   [name], MD     All other systems have been reviewed and were otherwise negative with the exception of those mentioned in the HPI and as above.  Physical Exam: There were no vitals filed for this visit.  General: Alert, no acute distress Cardiovascular: No pedal edema Respiratory: No cyanosis, no use of accessory musculature Skin: No lesions in the area of chief complaint Neurologic: Sensation intact distally Psychiatric: Patient is competent for consent with normal mood and affect Lymphatic: No axillary or cervical lymphadenopathy  MUSCULOSKELETAL: + TTP mid back  Assessment/Plan: T11 compression fracture Plan for Procedure(s): T 11 KYPHOPLASTY AND BIOPSY   Sinclair Ship, MD 05/14/2016 6:55 AM

## 2016-05-14 NOTE — Op Note (Signed)
NAMESHAKEITA, VANDEVANDER NO.:  [id number]  MEDICAL RECORD NO.:  [id number]  LOCATION:  XRAY                         FACILITY:  [facility]  PHYSICIAN:  Phylliss Bob, MD      DATE OF BIRTH:  1945-04-01  DATE OF PROCEDURE:  04/23/2016                               OPERATIVE REPORT   PREOPERATIVE DIAGNOSIS:  T11 compression fracture, possibly pathologic.  POSTOPERATIVE DIAGNOSIS:  T11 compression fracture, possibly pathologic.  PROCEDURES: 1. T11 kyphoplasty. 2. Biopsy of the T11 vertebral body.  SURGEON:  Phylliss Bob, MD  ASSISTANT:  Pricilla Holm, PA-C.  ANESTHESIA:  General endotracheal anesthesia.  COMPLICATIONS:  None.  DISPOSITION:  Stable.  ESTIMATED BLOOD LOSS:  Minimal.  INDICATIONS FOR SURGERY:  Briefly, Ms. Mcconnon is a pleasant 71 year old female who did present to me on May 04, 2016 with a 31-month history of ongoing pain in the low back.  The patient's pain was rather substantial.  It did not improve with appropriate conservative treatment.  An MRI and a bone scan did reveal edema and activity associated with the T11 vertebral body.  An obvious deformity was also noted.  There was also deformity of the L2 vertebral body, however, I did compare this particular deformity to x-rays from 3 years ago, which did also reveal the same deformity at L2, which did diagnose the L2 fractures being old and chronic and very unlikely to be a pain generator.  Given the patient's ongoing pain and dysfunction, we did discuss proceeding with the procedure reflected above.  The patient was fully aware of the risks and limitations of surgery and did elect to proceed.  OPERATIVE DETAILS:  On May 14, 2016, the patient was brought to surgery and general endotracheal anesthesia was administered.  The patient was placed prone on a well-padded flat Jackson bed.  Gels were placed under the patient's chest and hips.  Antibiotics were given and a time-out procedure was  performed.  Two very small stab incisions were made at the upper and lateral aspects of the T11 pedicles.  I then cannulated the pedicles in the usual manner.  I then used the drill in order to obtain bone fragments.  A few very small fragments were obtained and were placed in a specimen cup and sent to Pathology for additional evaluation.  At this point, I inserted kyphoplasty balloons, which were each inflated with approximately 4 mL of contrast.  Partial restoration of the superior endplate was noted.  I then introduced a cement into the vertebral bodies on the right and left sides. Introducing a total of approximately 7 mL of cement, there was excellent interdigitation of cement noted.  There was no extravasation of cement posteriorly, anteriorly, or laterally.  The cement was then allowed to harden.  The cannulas were then removed.  The wound was irrigated and closed with 4-0 Monocryl.  Benzoin and a sterile dressing were applied.  The patient was then awakened from general endotracheal anesthesia and transferred to recovery in stable condition.     Phylliss Bob, MD     MD/MEDQ  D:  05/14/2016  T:  05/14/2016  Job:  [id number]  cc:   Velna Hatchet, MD [name],  M.D.  

## 2016-05-14 NOTE — Transfer of Care (Signed)
Immediate Anesthesia Transfer of Care Note  Patient: [name]  Procedure(s) Performed: Procedure(s) with comments: T 11 KYPHOPLASTY (N/A) - T 11 KYPHOPLASTY  Patient Location: PACU  Anesthesia Type:General  Level of Consciousness: awake, alert , oriented and patient cooperative  Airway & Oxygen Therapy: Patient Spontanous Breathing and Patient connected to nasal cannula oxygen  Post-op Assessment: Report given to RN, Post -op Vital signs reviewed and stable and Patient moving all extremities  Post vital signs: Reviewed and stable  Last Vitals:  Vitals:   05/14/16 0913 05/14/16 1436  BP: (!) 112/59 127/67  Pulse: 83 92  Resp: 18 16  Temp: 36.5 C 36.7 C    Last Pain:  Vitals:   05/14/16 0942  TempSrc:   PainSc: 2       Patients Stated Pain Goal: 3 (35/[id number])  Complications: No apparent anesthesia complications

## 2016-05-15 ENCOUNTER — Encounter (HOSPITAL_COMMUNITY): Payer: Self-pay | Admitting: Orthopedic Surgery

## 2016-05-18 ENCOUNTER — Telehealth: Payer: Self-pay | Admitting: *Deleted

## 2016-05-18 ENCOUNTER — Inpatient Hospital Stay (HOSPITAL_COMMUNITY): Admission: RE | Admit: 2016-05-18 | Payer: Medicare Other | Source: Ambulatory Visit

## 2016-05-18 MED ORDER — SULFAMETHOXAZOLE-TRIMETHOPRIM 800-160 MG PO TABS: 1.0000 | ORAL_TABLET | Freq: Two times a day (BID) | ORAL | 0 refills | Status: DC

## 2016-05-18 NOTE — Telephone Encounter (Signed)
Patient is c/o continued R flank pain. She states when she was here last week, Judson Roch had mentioned the possibility of an antibiotic. When chart reviewed, there is a UA from 4 days ago which is suspicious for UTI.  Spoke with Laverna Peace NP and she would like patient to start on an antibiotic for 5 days.   Prescription sent. Patient aware. Pharmacy confirmed.

## 2016-05-20 DIAGNOSIS — S22080D Wedge compression fracture of T11-T12 vertebra, subsequent encounter for fracture with routine healing: Secondary | ICD-10-CM | POA: Diagnosis not present

## 2016-05-27 ENCOUNTER — Other Ambulatory Visit: Payer: Self-pay | Admitting: Orthopedic Surgery

## 2016-05-27 ENCOUNTER — Other Ambulatory Visit (HOSPITAL_BASED_OUTPATIENT_CLINIC_OR_DEPARTMENT_OTHER): Payer: Medicare Other

## 2016-05-27 ENCOUNTER — Ambulatory Visit (HOSPITAL_BASED_OUTPATIENT_CLINIC_OR_DEPARTMENT_OTHER): Payer: Medicare Other | Admitting: Family

## 2016-05-27 ENCOUNTER — Ambulatory Visit (HOSPITAL_BASED_OUTPATIENT_CLINIC_OR_DEPARTMENT_OTHER): Payer: Medicare Other

## 2016-05-27 VITALS — BP 115/72 | HR 93 | Temp 97.5°F | Resp 19 | Wt 131.8 lb

## 2016-05-27 VITALS — BP 123/64 | HR 79 | Resp 20

## 2016-05-27 DIAGNOSIS — D508 Other iron deficiency anemias: Secondary | ICD-10-CM

## 2016-05-27 DIAGNOSIS — D500 Iron deficiency anemia secondary to blood loss (chronic): Secondary | ICD-10-CM

## 2016-05-27 DIAGNOSIS — G713 Mitochondrial myopathy, not elsewhere classified: Secondary | ICD-10-CM

## 2016-05-27 DIAGNOSIS — D45 Polycythemia vera: Secondary | ICD-10-CM

## 2016-05-27 DIAGNOSIS — M810 Age-related osteoporosis without current pathological fracture: Secondary | ICD-10-CM

## 2016-05-27 DIAGNOSIS — E611 Iron deficiency: Secondary | ICD-10-CM | POA: Diagnosis not present

## 2016-05-27 DIAGNOSIS — M545 Low back pain: Secondary | ICD-10-CM

## 2016-05-27 DIAGNOSIS — D510 Vitamin B12 deficiency anemia due to intrinsic factor deficiency: Secondary | ICD-10-CM

## 2016-05-27 LAB — CMP (CANCER CENTER ONLY)
ALT(SGPT): 26 U/L (ref 10–47)
AST: 32 U/L (ref 11–38)
Albumin: 3.4 g/dL (ref 3.3–5.5)
Alkaline Phosphatase: 100 U/L — ABNORMAL HIGH (ref 26–84)
BUN, Bld: 13 mg/dL (ref 7–22)
CO2: 23 mEq/L (ref 18–33)
Calcium: 9.1 mg/dL (ref 8.0–10.3)
Chloride: 105 mEq/L (ref 98–108)
Creat: 0.7 mg/dl (ref 0.6–1.2)
Glucose, Bld: 94 mg/dL (ref 73–118)
Potassium: 4 mEq/L (ref 3.3–4.7)
Sodium: 137 mEq/L (ref 128–145)
Total Bilirubin: 0.8 mg/dl (ref 0.20–1.60)
Total Protein: 7.3 g/dL (ref 6.4–8.1)

## 2016-05-27 LAB — CBC WITH DIFFERENTIAL (CANCER CENTER ONLY)
BASO#: 0 10*3/uL (ref 0.0–0.2)
BASO%: 0.6 % (ref 0.0–2.0)
EOS%: 3.5 % (ref 0.0–7.0)
Eosinophils Absolute: 0.2 10*3/uL (ref 0.0–0.5)
HCT: 47.9 % — ABNORMAL HIGH (ref 34.8–46.6)
HGB: 16.4 g/dL — ABNORMAL HIGH (ref 11.6–15.9)
LYMPH#: 1.1 10*3/uL (ref 0.9–3.3)
LYMPH%: 16 % (ref 14.0–48.0)
MCH: 34.7 pg — ABNORMAL HIGH (ref 26.0–34.0)
MCHC: 34.2 g/dL (ref 32.0–36.0)
MCV: 102 fL — ABNORMAL HIGH (ref 81–101)
MONO#: 0.6 10*3/uL (ref 0.1–0.9)
MONO%: 9.6 % (ref 0.0–13.0)
NEUT#: 4.6 10*3/uL (ref 1.5–6.5)
NEUT%: 70.3 % (ref 39.6–80.0)
Platelets: 248 10*3/uL (ref 145–400)
RBC: 4.72 10*6/uL (ref 3.70–5.32)
RDW: 14.5 % (ref 11.1–15.7)
WBC: 6.6 10*3/uL (ref 3.9–10.0)

## 2016-05-27 LAB — TECHNOLOGIST REVIEW CHCC SATELLITE: Tech Review: 2

## 2016-05-27 MED ORDER — ZOLEDRONIC ACID 4 MG/100ML IV SOLN: 4.0000 mg | Freq: Once | INTRAVENOUS | Status: DC

## 2016-05-27 MED ORDER — SODIUM CHLORIDE 0.9 % IV SOLN
500.0000 mL | Freq: Once | INTRAVENOUS | Status: AC
  Administered 2016-05-27: 17:00:00 via INTRAVENOUS

## 2016-05-27 MED ORDER — ZOLEDRONIC ACID 4 MG/100ML IV SOLN
4.0000 mg | Freq: Once | INTRAVENOUS | Status: AC
  Administered 2016-05-27: 4 mg via INTRAVENOUS
  Filled 2016-05-27: qty 100

## 2016-05-27 NOTE — Progress Notes (Signed)
[name] presents today for phlebotomy per MD orders. Phlebotomy procedure started at 1530 and ended at 1615. 400 grams removed, needle clotted. Patient to receive 500 mls post Zometa infusion.   Patient tolerated procedure well. IV needle removed intact.

## 2016-05-27 NOTE — Progress Notes (Signed)
Hematology and Oncology Follow Up Visit  LYNIA LANDRY [id number] 1945/08/02 71 y.o. 05/27/2016   Principle Diagnosis:  1. 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 Phlebotomy to maintain hematocrit below 45% 2. Aspirin 81 mg p.o. Daily. 3. IV iron as indicated - last received Dec/Jan x 2 4. Zometa IV every 6 months   Interim History: Ms. Doane is here today for follow-up with her husband. She is still having a great deal of back pain. She states that the day after her T 11 kyphoplasty went well but the following day she had more back pain. She has had a hard time getting a hold of anyone in Dr. Laurena Bering office to scheduled a follow-up. She will have MRIs of the thoracic and lumbar spine on May 5th to re-evaluate.  She states that she spends most of her time in bed due to the pain.  No fever, chills, n/v, cough, rash, dizziness, SOB, chest pain, palpitations, abdominal pain or changes in bowel or bladder habits.  No swelling, tenderness, numbness or tingling in her extremities. No new aches or pains. She has a good appetite and is staying well hydrated. Her weight is stable.   ECOG Performance Status: 3 - Symptomatic, >50% confined to bed  Medications:  Allergies as of 05/27/2016      Reactions   Dulera [mometasone Furo-formoterol Fum] Cough      Medication List       Accurate as of 05/27/16  3:49 PM. Always use your most recent med list.          albuterol 108 (90 Base) MCG/ACT inhaler Commonly known as:  PROVENTIL HFA;VENTOLIN HFA Inhale 2 puffs into the lungs every 4 (four) hours as needed for wheezing or shortness of breath.   budesonide-formoterol 160-4.5 MCG/ACT inhaler Commonly known as:  SYMBICORT Inhale 2 puffs into the lungs 2 (two) times daily.   LOPREEZA 0.5-0.1 MG tablet Generic drug:   Estradiol-Norethindrone Acet Take 1 tablet by mouth daily.   LORazepam 0.5 MG tablet Commonly known as:  ATIVAN TAKE 1 TABLET BY MOUTH EVERY NIGHT AT BEDTIME   pantoprazole 40 MG tablet Commonly known as:  PROTONIX Take 40 mg by mouth daily at 8 pm.   sertraline 100 MG tablet Commonly known as:  ZOLOFT Take 100 mg by mouth at bedtime.   sulfamethoxazole-trimethoprim 800-160 MG tablet Commonly known as:  BACTRIM DS,SEPTRA DS Take 1 tablet by mouth 2 (two) times daily. For 5 days   Vitamin D (Ergocalciferol) 50000 units Caps capsule Commonly known as:  DRISDOL Take 50,000 Units by mouth every 7 (seven) days. Sundays       Allergies:  Allergies  Allergen Reactions  . Dulera [Mometasone Furo-Formoterol Fum] Cough    Past Medical History, Surgical history, Social history, and Family History were reviewed and updated.  Review of Systems: All other 10 point review of systems is negative.   Physical Exam:  weight is 131 lb 12.8 oz (59.8 kg). Her oral temperature is 97.5 F (36.4 C). Her blood pressure is 115/72 and her pulse is 93. Her respiration is 19 and oxygen saturation is 99%.   Wt Readings from Last 3 Encounters:  05/27/16 131 lb 12.8 oz (59.8 kg)  05/14/16 135 lb (61.2 kg)  04/27/16 132 lb 12.8 oz (60.2 kg)    Ocular: Sclerae unicteric, pupils equal, round and reactive to  light Ear-nose-throat: Oropharynx clear, dentition fair Lymphatic: No cervical, supraclavicular or axillary adenopathy Lungs no rales or rhonchi, good excursion bilaterally Heart regular rate and rhythm, no murmur appreciated Abd soft, nontender, positive bowel sounds, no liver or spleen tip palpated on exam, no fluid wave MSK no focal spinal tenderness, no joint edema Neuro: non-focal, well-oriented, appropriate affect Breasts: Deferred   Lab Results  Component Value Date   WBC 6.6 05/27/2016   HGB 16.4 (H) 05/27/2016   HCT 47.9 (H) 05/27/2016   MCV 102 (H) 05/27/2016   PLT 248  05/27/2016   Lab Results  Component Value Date   FERRITIN 504 (H) 04/27/2016   IRON 104 04/27/2016   TIBC 228 (L) 04/27/2016   UIBC 123 04/27/2016   IRONPCTSAT 46 04/27/2016   Lab Results  Component Value Date   RETICCTPCT 1.5 06/12/2014   RBC 4.72 05/27/2016   RETICCTABS 65.9 06/12/2014   Lab Results  Component Value Date   KAPLAMBRATIO 0.91 04/27/2016   Lab Results  Component Value Date   IGGSERUM 897 04/27/2016   IGA 339 02/27/2014   IGMSERUM 132 04/27/2016   Lab Results  Component Value Date   MSPIKE Not Observed 04/27/2016     Chemistry      Component Value Date/Time   NA 137 05/27/2016 1431   NA 138 01/17/2016 1356   K 4.0 05/27/2016 1431   K 4.1 01/17/2016 1356   CL 105 05/27/2016 1431   CO2 23 05/27/2016 1431   CO2 25 01/17/2016 1356   BUN 13 05/27/2016 1431   BUN 12.3 01/17/2016 1356   CREATININE 0.7 05/27/2016 1431   CREATININE 0.8 01/17/2016 1356      Component Value Date/Time   CALCIUM 9.1 05/27/2016 1431   CALCIUM 9.1 01/17/2016 1356   ALKPHOS 100 (H) 05/27/2016 1431   ALKPHOS 87 01/17/2016 1356   AST 32 05/27/2016 1431   AST 48 (H) 01/17/2016 1356   ALT 26 05/27/2016 1431   ALT 38 01/17/2016 1356   BILITOT 0.80 05/27/2016 1431   BILITOT 0.67 01/17/2016 1356      Impression and Plan: Ms. Fuhrman is a very pleasant 71 yo caucasian female with both hemochromatosis and polycythemia. She also has iron deficiency due to her frequent phlebotomies. She had her T 11 kyphoplasty 2 weeks ago and unfortunately is still having lots of pain.  She will be having an MRI of the thoracic and cervical spine later this week for re-evaluation.  We will start her Zometa today. Her Hct was elevated at 47.9 so we will phlebotomize her today and give replacement fluids after.  Both she and her husband are in agreement with the plan.  We will plan to see her back in another 6 weeks for repeat lab work and follow-up.  She will contact our office with any questions or  concerns. We can certainly see her sooner if need be.   Eliezer Bottom, NP 5/2/20183:49 PM

## 2016-05-27 NOTE — Patient Instructions (Signed)
Therapeutic Phlebotomy, Care After Refer to this sheet in the next few weeks. These instructions provide you with information about caring for yourself after your procedure. Your health care provider may also give you more specific instructions. Your treatment has been planned according to current medical practices, but problems sometimes occur. Call your health care provider if you have any problems or questions after your procedure. What can I expect after the procedure? After the procedure, it is common to have:  Light-headedness or dizziness. You may feel faint.  Nausea.  Tiredness. Follow these instructions at home: Activity   Return to your normal activities as directed by your health care provider. Most people can go back to their normal activities right away.  Avoid strenuous physical activity and heavy lifting or pulling for about 5 hours after the procedure. Do not lift anything that is heavier than 10 lb (4.5 kg).  Athletes should avoid strenuous exercise for at least 12 hours.  Change positions slowly for the remainder of the day. This will help to prevent light-headedness or fainting.  If you feel light-headed, lie down until the feeling goes away. Eating and drinking   Be sure to eat well-balanced meals for the next 24 hours.  Drink enough fluid to keep your urine clear or pale yellow.  Avoid drinking alcohol on the day that you had the procedure. Care of the Needle Insertion Site   Keep your bandage dry. You can remove the bandage after about 5 hours or as directed by your health care provider.  If you have bleeding from the needle insertion site, elevate your arm and press firmly on the site until the bleeding stops.  If you have bruising at the site, apply ice to the area:  Put ice in a plastic bag.  Place a towel between your skin and the bag.  Leave the ice on for 20 minutes, 2-3 times a day for the first 24 hours.  If the swelling does not go away after  24 hours, apply a warm, moist washcloth to the area for 20 minutes, 2-3 times a day. General instructions   Avoid smoking for at least 30 minutes after the procedure.  Keep all follow-up visits as directed by your health care provider. It is important to continue with further therapeutic phlebotomy treatments as directed. Contact a health care provider if:  You have redness, swelling, or pain at the needle insertion site.  You have fluid, blood, or pus coming from the needle insertion site.  You feel light-headed, dizzy, or nauseated, and the feeling does not go away.  You notice new bruising at the needle insertion site.  You feel weaker than normal.  You have a fever or chills. Get help right away if:  You have severe nausea or vomiting.  You have chest pain.  You have trouble breathing. This information is not intended to replace advice given to you by your health care provider. Make sure you discuss any questions you have with your health care provider. Document Released: 06/16/2010 Document Revised: 09/14/2015 Document Reviewed: 01/08/2014 Elsevier Interactive Patient Education  2017 [employer] Zoledronic Acid injection (Hypercalcemia, Oncology) What is this medicine? ZOLEDRONIC ACID (ZOE le dron ik AS id) lowers the amount of calcium loss from bone. It is used to treat too much calcium in your blood from cancer. It is also used to prevent complications of cancer that has spread to the bone. This medicine may be used for other purposes; ask your health care provider  or pharmacist if you have questions. COMMON BRAND NAME(S): Zometa What should I tell my health care provider before I take this medicine? They need to know if you have any of these conditions: -aspirin-sensitive asthma -cancer, especially if you are receiving medicines used to treat cancer -dental disease or wear dentures -infection -kidney disease -receiving corticosteroids like dexamethasone or  prednisone -an unusual or allergic reaction to zoledronic acid, other medicines, foods, dyes, or preservatives -pregnant or trying to get pregnant -breast-feeding How should I use this medicine? This medicine is for infusion into a vein. It is given by a health care professional in a hospital or clinic setting. Talk to your pediatrician regarding the use of this medicine in children. Special care may be needed. Overdosage: If you think you have taken too much of this medicine contact a poison control center or emergency room at once. NOTE: This medicine is only for you. Do not share this medicine with others. What if I miss a dose? It is important not to miss your dose. Call your doctor or health care professional if you are unable to keep an appointment. What may interact with this medicine? -certain antibiotics given by injection -NSAIDs, medicines for pain and inflammation, like ibuprofen or naproxen -some diuretics like bumetanide, furosemide -teriparatide -thalidomide This list may not describe all possible interactions. Give your health care provider a list of all the medicines, herbs, non-prescription drugs, or dietary supplements you use. Also tell them if you smoke, drink alcohol, or use illegal drugs. Some items may interact with your medicine. What should I watch for while using this medicine? Visit your doctor or health care professional for regular checkups. It may be some time before you see the benefit from this medicine. Do not stop taking your medicine unless your doctor tells you to. Your doctor may order blood tests or other tests to see how you are doing. Women should inform their doctor if they wish to become pregnant or think they might be pregnant. There is a potential for serious side effects to an unborn child. Talk to your health care professional or pharmacist for more information. You should make sure that you get enough calcium and vitamin D while you are taking this  medicine. Discuss the foods you eat and the vitamins you take with your health care professional. Some people who take this medicine have severe bone, joint, and/or muscle pain. This medicine may also increase your risk for jaw problems or a broken thigh bone. Tell your doctor right away if you have severe pain in your jaw, bones, joints, or muscles. Tell your doctor if you have any pain that does not go away or that gets worse. Tell your dentist and dental surgeon that you are taking this medicine. You should not have major dental surgery while on this medicine. See your dentist to have a dental exam and fix any dental problems before starting this medicine. Take good care of your teeth while on this medicine. Make sure you see your dentist for regular follow-up appointments. What side effects may I notice from receiving this medicine? Side effects that you should report to your doctor or health care professional as soon as possible: -allergic reactions like skin rash, itching or hives, swelling of the face, lips, or tongue -anxiety, confusion, or depression -breathing problems -changes in vision -eye pain -feeling faint or lightheaded, falls -jaw pain, especially after dental work -mouth sores -muscle cramps, stiffness, or weakness -redness, blistering, peeling or loosening of the  skin, including inside the mouth -trouble passing urine or change in the amount of urine Side effects that usually do not require medical attention (report to your doctor or health care professional if they continue or are bothersome): -bone, joint, or muscle pain -constipation -diarrhea -fever -hair loss -irritation at site where injected -loss of appetite -nausea, vomiting -stomach upset -trouble sleeping -trouble swallowing -weak or tired This list may not describe all possible side effects. Call your doctor for medical advice about side effects. You may report side effects to FDA at 1-800-FDA-1088. Where  should I keep my medicine? This drug is given in a hospital or clinic and will not be stored at home. NOTE: This sheet is a summary. It may not cover all possible information. If you have questions about this medicine, talk to your doctor, pharmacist, or health care provider.  2018 Elsevier/Gold Standard (2013-06-10 14:19:39)

## 2016-05-28 LAB — IRON AND TIBC
%SAT: 39 % (ref 21–57)
Iron: 103 ug/dL (ref 41–142)
TIBC: 265 ug/dL (ref 236–444)
UIBC: 162 ug/dL (ref 120–384)

## 2016-05-28 LAB — FERRITIN: Ferritin: 229 ng/ml (ref 9–269)

## 2016-05-30 ENCOUNTER — Ambulatory Visit
Admission: RE | Admit: 2016-05-30 | Discharge: 2016-05-30 | Disposition: A | Discharge status: Home / Self Care | Payer: Medicare Other | Source: Ambulatory Visit | Attending: Orthopedic Surgery | Admitting: Orthopedic Surgery

## 2016-05-30 DIAGNOSIS — M545 Low back pain: Secondary | ICD-10-CM

## 2016-05-30 DIAGNOSIS — S22089D Unspecified fracture of T11-T12 vertebra, subsequent encounter for fracture with routine healing: Secondary | ICD-10-CM | POA: Diagnosis not present

## 2016-06-01 ENCOUNTER — Other Ambulatory Visit: Payer: Medicare Other

## 2016-06-01 ENCOUNTER — Ambulatory Visit: Payer: Medicare Other

## 2016-06-01 ENCOUNTER — Ambulatory Visit: Payer: Medicare Other | Admitting: Hematology & Oncology

## 2016-06-02 ENCOUNTER — Other Ambulatory Visit: Payer: Medicare Other

## 2016-06-02 DIAGNOSIS — M545 Low back pain: Secondary | ICD-10-CM | POA: Diagnosis not present

## 2016-06-02 DIAGNOSIS — M546 Pain in thoracic spine: Secondary | ICD-10-CM | POA: Diagnosis not present

## 2016-06-04 ENCOUNTER — Other Ambulatory Visit: Payer: Self-pay | Admitting: Orthopedic Surgery

## 2016-06-08 ENCOUNTER — Other Ambulatory Visit (HOSPITAL_COMMUNITY): Payer: Self-pay | Admitting: Orthopedic Surgery

## 2016-06-08 DIAGNOSIS — M4850XA Collapsed vertebra, not elsewhere classified, site unspecified, initial encounter for fracture: Secondary | ICD-10-CM

## 2016-06-08 DIAGNOSIS — IMO0001 Reserved for inherently not codable concepts without codable children: Secondary | ICD-10-CM

## 2016-06-08 DIAGNOSIS — M549 Dorsalgia, unspecified: Secondary | ICD-10-CM

## 2016-06-09 ENCOUNTER — Other Ambulatory Visit: Payer: Self-pay | Admitting: Student

## 2016-06-09 ENCOUNTER — Other Ambulatory Visit: Payer: Self-pay | Admitting: Radiology

## 2016-06-09 ENCOUNTER — Telehealth: Payer: Self-pay | Admitting: Emergency Medicine

## 2016-06-09 NOTE — Telephone Encounter (Signed)
Spoke with pt, has concerns about Symbicort- states she saw an ad on TV stating this is not a healthy medication to take long term.  Pt is concerned about taking this medication, is requesting RB's recs on this, wants to know if she should switch inhalers.  Pt notes Symbicort is working well for her.  Pt is having a procedure done tomorrow, requests we leave a detailed message on home number.    RB please advise.  Thanks.

## 2016-06-10 ENCOUNTER — Other Ambulatory Visit (HOSPITAL_COMMUNITY): Payer: Self-pay | Admitting: Orthopedic Surgery

## 2016-06-10 ENCOUNTER — Ambulatory Visit (HOSPITAL_COMMUNITY)
Admission: RE | Admit: 2016-06-10 | Discharge: 2016-06-10 | Disposition: A | Discharge status: Home / Self Care | Payer: Medicare Other | Source: Ambulatory Visit | Attending: Orthopedic Surgery | Admitting: Orthopedic Surgery

## 2016-06-10 ENCOUNTER — Encounter (HOSPITAL_COMMUNITY): Payer: Self-pay

## 2016-06-10 DIAGNOSIS — IMO0001 Reserved for inherently not codable concepts without codable children: Secondary | ICD-10-CM

## 2016-06-10 DIAGNOSIS — M4804 Spinal stenosis, thoracic region: Secondary | ICD-10-CM | POA: Diagnosis not present

## 2016-06-10 DIAGNOSIS — F329 Major depressive disorder, single episode, unspecified: Secondary | ICD-10-CM | POA: Insufficient documentation

## 2016-06-10 DIAGNOSIS — K589 Irritable bowel syndrome without diarrhea: Secondary | ICD-10-CM | POA: Diagnosis not present

## 2016-06-10 DIAGNOSIS — Z7951 Long term (current) use of inhaled steroids: Secondary | ICD-10-CM | POA: Insufficient documentation

## 2016-06-10 DIAGNOSIS — M545 Low back pain: Secondary | ICD-10-CM | POA: Diagnosis not present

## 2016-06-10 DIAGNOSIS — J45909 Unspecified asthma, uncomplicated: Secondary | ICD-10-CM | POA: Diagnosis not present

## 2016-06-10 DIAGNOSIS — M549 Dorsalgia, unspecified: Secondary | ICD-10-CM

## 2016-06-10 DIAGNOSIS — E559 Vitamin D deficiency, unspecified: Secondary | ICD-10-CM | POA: Diagnosis not present

## 2016-06-10 DIAGNOSIS — N189 Chronic kidney disease, unspecified: Secondary | ICD-10-CM | POA: Insufficient documentation

## 2016-06-10 DIAGNOSIS — G43909 Migraine, unspecified, not intractable, without status migrainosus: Secondary | ICD-10-CM | POA: Diagnosis not present

## 2016-06-10 DIAGNOSIS — M4854XD Collapsed vertebra, not elsewhere classified, thoracic region, subsequent encounter for fracture with routine healing: Secondary | ICD-10-CM | POA: Diagnosis not present

## 2016-06-10 DIAGNOSIS — K219 Gastro-esophageal reflux disease without esophagitis: Secondary | ICD-10-CM | POA: Diagnosis not present

## 2016-06-10 DIAGNOSIS — F419 Anxiety disorder, unspecified: Secondary | ICD-10-CM | POA: Diagnosis not present

## 2016-06-10 DIAGNOSIS — M4854XA Collapsed vertebra, not elsewhere classified, thoracic region, initial encounter for fracture: Secondary | ICD-10-CM | POA: Diagnosis not present

## 2016-06-10 DIAGNOSIS — D7589 Other specified diseases of blood and blood-forming organs: Secondary | ICD-10-CM | POA: Diagnosis not present

## 2016-06-10 DIAGNOSIS — M4850XA Collapsed vertebra, not elsewhere classified, site unspecified, initial encounter for fracture: Secondary | ICD-10-CM

## 2016-06-10 DIAGNOSIS — I252 Old myocardial infarction: Secondary | ICD-10-CM | POA: Insufficient documentation

## 2016-06-10 DIAGNOSIS — M810 Age-related osteoporosis without current pathological fracture: Secondary | ICD-10-CM | POA: Insufficient documentation

## 2016-06-10 DIAGNOSIS — D45 Polycythemia vera: Secondary | ICD-10-CM | POA: Diagnosis not present

## 2016-06-10 HISTORY — PX: IR FLUORO GUIDED NEEDLE PLC ASPIRATION/INJECTION LOC: IMG2395

## 2016-06-10 LAB — CBC
HCT: 45.8 % (ref 36.0–46.0)
Hemoglobin: 15.3 g/dL — ABNORMAL HIGH (ref 12.0–15.0)
MCH: 34.3 pg — ABNORMAL HIGH (ref 26.0–34.0)
MCHC: 33.4 g/dL (ref 30.0–36.0)
MCV: 102.7 fL — ABNORMAL HIGH (ref 78.0–100.0)
Platelets: 157 10*3/uL (ref 150–400)
RBC: 4.46 MIL/uL (ref 3.87–5.11)
RDW: 15 % (ref 11.5–15.5)
WBC: 5.4 10*3/uL (ref 4.0–10.5)

## 2016-06-10 LAB — APTT: aPTT: 28 seconds (ref 24–36)

## 2016-06-10 LAB — PROTIME-INR
INR: 1.01
Prothrombin Time: 13.3 seconds (ref 11.4–15.2)

## 2016-06-10 MED ORDER — BUPIVACAINE HCL (PF) 0.25 % IJ SOLN
INTRAMUSCULAR | Status: AC
  Filled 2016-06-10: qty 30

## 2016-06-10 MED ORDER — MIDAZOLAM HCL 2 MG/2ML IJ SOLN
INTRAMUSCULAR | Status: AC | PRN
  Administered 2016-06-10: 0.5 mg via INTRAVENOUS
  Administered 2016-06-10: 1 mg via INTRAVENOUS
  Administered 2016-06-10: 0.5 mg via INTRAVENOUS
  Administered 2016-06-10: 1 mg via INTRAVENOUS

## 2016-06-10 MED ORDER — HYDROMORPHONE HCL 1 MG/ML IJ SOLN
INTRAMUSCULAR | Status: AC
  Filled 2016-06-10: qty 1

## 2016-06-10 MED ORDER — MIDAZOLAM HCL 2 MG/2ML IJ SOLN
INTRAMUSCULAR | Status: AC
  Filled 2016-06-10: qty 4

## 2016-06-10 MED ORDER — FENTANYL CITRATE (PF) 100 MCG/2ML IJ SOLN
INTRAMUSCULAR | Status: AC
  Filled 2016-06-10: qty 4

## 2016-06-10 MED ORDER — FENTANYL CITRATE (PF) 100 MCG/2ML IJ SOLN
INTRAMUSCULAR | Status: AC | PRN
  Administered 2016-06-10 (×3): 25 ug via INTRAVENOUS
  Administered 2016-06-10: 12.5 ug via INTRAVENOUS

## 2016-06-10 MED ORDER — SODIUM CHLORIDE 0.9 % IV SOLN: INTRAVENOUS | Status: AC

## 2016-06-10 MED ORDER — HYDROMORPHONE HCL 1 MG/ML IJ SOLN
INTRAMUSCULAR | Status: AC | PRN
  Administered 2016-06-10 (×4): 0.5 mg via INTRAVENOUS

## 2016-06-10 MED ORDER — SODIUM CHLORIDE 0.9 % IV SOLN
INTRAVENOUS | Status: AC | PRN
  Administered 2016-06-10: 10 mL/h via INTRAVENOUS

## 2016-06-10 MED ORDER — BUPIVACAINE HCL (PF) 0.25 % IJ SOLN
INTRAMUSCULAR | Status: AC
  Administered 2016-06-10: 30 mL
  Filled 2016-06-10: qty 30

## 2016-06-10 MED ORDER — SODIUM CHLORIDE 0.9 % IV SOLN: INTRAVENOUS | Status: DC

## 2016-06-10 NOTE — Discharge Instructions (Addendum)
Needle Biopsy of the Bone, Care After Refer to this sheet in the next few weeks. These instructions provide you with information about caring for yourself after your procedure. Your health care provider may also give you more specific instructions. Your treatment has been planned according to current medical practices, but problems sometimes occur. Call your health care provider if you have any problems or questions after your procedure. What can I expect after the procedure? After your procedure, it is common to have soreness or tenderness at the puncture site. Follow these instructions at home:  Take over-the-counter and prescription medicines only as told by your health care provider.  Bathe and shower as told by your health care provider.  Follow instructions from your health care provider about:  How to take care of your puncture site.  When and how you should change your bandage (dressing).  When you should remove your dressing.  Check your puncture site every day for signs of infection. Watch for:  Redness, swelling, or worsening pain.  Fluid, blood, or pus.  Return to your normal activities as told by your health care provider.  Keep all follow-up visits as told by your health care provider. This is important. Contact a health care provider if:  You have redness, swelling, or worsening pain at the site of your puncture.  You have fluid, blood, or pus coming from your puncture site.  You have a fever.  You have persistent nausea or vomiting. Get help right away if:  You develop a rash.  You have difficulty breathing. This information is not intended to replace advice given to you by your health care provider. Make sure you discuss any questions you have with your health care provider. Document Released: 08/01/2004 Document Revised: 06/20/2015 Document Reviewed: 02/19/2014 Elsevier Interactive Patient Education  2017 [employer]. Moderate Conscious Sedation, Adult,  Care After These instructions provide you with information about caring for yourself after your procedure. Your health care provider may also give you more specific instructions. Your treatment has been planned according to current medical practices, but problems sometimes occur. Call your health care provider if you have any problems or questions after your procedure. What can I expect after the procedure? After your procedure, it is common:  To feel sleepy for several hours.  To feel clumsy and have poor balance for several hours.  To have poor judgment for several hours.  To vomit if you eat too soon. Follow these instructions at home: For at least 24 hours after the procedure:    Do not:  Participate in activities where you could fall or become injured.  Drive.  Use heavy machinery.  Drink alcohol.  Take sleeping pills or medicines that cause drowsiness.  Make important decisions or sign legal documents.  Take care of children on your own.  Rest. Eating and drinking   Follow the diet recommended by your health care provider.  If you vomit:  Drink water, juice, or soup when you can drink without vomiting.  Make sure you have little or no nausea before eating solid foods. General instructions   Have a responsible adult stay with you until you are awake and alert.  Take over-the-counter and prescription medicines only as told by your health care provider.  If you smoke, do not smoke without supervision.  Keep all follow-up visits as told by your health care provider. This is important. Contact a health care provider if:  You keep feeling nauseous or you keep vomiting.  You  feel light-headed.  You develop a rash.  You have a fever. Get help right away if:  You have trouble breathing. This information is not intended to replace advice given to you by your health care provider. Make sure you discuss any questions you have with your health care  provider. Document Released: 11/02/2012 Document Revised: 06/17/2015 Document Reviewed: 05/04/2015 Elsevier Interactive Patient Education  2017 [place]. 1.Rest in bed until tomorrow. 2.Ambulate with a walker . 3.Avoid stooping,bending of lifting more than 10 lbs at one time

## 2016-06-10 NOTE — Procedures (Signed)
S/ P T 10 and T 12 fluoro guided biopsy.

## 2016-06-10 NOTE — H&P (Signed)
Chief Complaint: Patient was seen in consultation today for Thoracic 10 and 12 biopsy at the request of [facility]  Referring Physician(s): Dumonski,Mark  Supervising Physician: Luanne Bras  Patient Status: [facility] - Out-pt  History of Present Illness: [name] is a 71 y.o. female   Sudden onset back pain Feb 2018 Denies injury Has been seen by Dr Collene Gobble T 11 KP performed several weeks ago---pain was diminished for maybe one day Pain continued and worsened  MRI 05/31/16:IMPRESSION: MR THORACIC SPINE IMPRESSION  1. Progressive infiltrative marrow lesion at the fractured and treated T11 vertebral body, extending into the posterior elements, right paravertebral soft tissues, and T10/T12 vertebrae. An infiltrative leukemia or lymphoma is favored, consider repeat biopsy or additional testing of the prior sample. Tuberculous or other atypical infection is a less likely consideration, but microbiology may be useful at any repeat biopsy. 2. T11 mild spinal stenosis without cord impingement. 3. Diffusely hypointense marrow in this patient with polycythemia Vera  Referred to Dr Marin Olp His decision is to biopsy lesions first and determine best plan after diagnosis. Plan today for Thoracic 10 and 12 biopsy   Past Medical History:  Diagnosis Date  . Allergy   . Anemia   . Anxiety   . Asthma    DR. BYRUM  . Blood transfusion without reported diagnosis   . Cholelithiasis   . Chronic kidney disease    gallstones   . Depression   . Dyspnea   . Dysrhythmia    palpitations  . GERD (gastroesophageal reflux disease)   . Heart attack ([place])   . Hemorrhoids   . IBS (irritable bowel syndrome)   . Iliotibial band syndrome    left knee  . Migraine   . Mitochondrial myopathy   . Normal coronary arteries    by cardiac catheterization performed by myself 02/25/01  . Osteoporosis   . Pericarditis    age 89  . Pneumothorax   . Polycythemia vera(238.4)  06/17/2012  . PVC's (premature ventricular contractions)   . Vitamin D deficiency     Past Surgical History:  Procedure Laterality Date  . APPENDECTOMY    . CARDIAC CATHETERIZATION    . CATARACT EXTRACTION W/ INTRAOCULAR LENS  IMPLANT, BILATERAL    . CESAREAN SECTION     x 4  . COLONOSCOPY    . DILATATION & CURETTAGE/HYSTEROSCOPY WITH MYOSURE N/A 10/29/2014   Procedure: DILATATION & CURETTAGE/HYSTEROSCOPY WITH MYOSURE;  Surgeon: Paula Compton, MD;  Location: [facility];  Service: Gynecology;  Laterality: N/A;  . KYPHOPLASTY N/A 05/14/2016   Procedure: T 11 KYPHOPLASTY;  Surgeon: Phylliss Bob, MD;  Location: [place];  Service: Orthopedics;  Laterality: N/A;  T 11 KYPHOPLASTY  . MOUTH SURGERY    . NASAL SINUS SURGERY    . NM MYOCAR PERF WALL MOTION  04/27/2006   normal  . US ECHOCARDIOGRAPHY  12/25/2010   normal  . VEIN SURGERY      Allergies: Dulera [mometasone furo-formoterol fum]  Medications: Prior to Admission medications   Medication Sig Start Date End Date Taking? Authorizing Provider  budesonide-formoterol (SYMBICORT) 160-4.5 MCG/ACT inhaler Inhale 2 puffs into the lungs 2 (two) times daily. 04/10/16  Yes Collene Gobble, MD  cyclobenzaprine (FLEXERIL) 5 MG tablet Take 1 tablet by mouth daily as needed. 06/02/16  Yes [provider]  Estradiol-Norethindrone Acet (LOPREEZA) 0.5-0.1 MG per tablet Take 1 tablet by mouth daily.   Yes [provider]  LORazepam (ATIVAN) 0.5 MG tablet TAKE 1  TABLET BY MOUTH EVERY NIGHT AT BEDTIME 05/01/14  Yes Leandrew Koyanagi, MD  pantoprazole (PROTONIX) 40 MG tablet Take 40 mg by mouth daily at 8 pm. 11/08/15  Yes [provider]  sertraline (ZOLOFT) 100 MG tablet Take 100 mg by mouth at bedtime.  03/09/16  Yes [provider]  Vitamin D, Ergocalciferol, (DRISDOL) 50000 UNITS CAPS capsule Take 50,000 Units by mouth every 7 (seven) days. Sundays   Yes [provider]  albuterol (PROVENTIL HFA;VENTOLIN HFA)  108 (90 Base) MCG/ACT inhaler Inhale 2 puffs into the lungs every 4 (four) hours as needed for wheezing or shortness of breath. 01/15/16   Collene Gobble, MD     Family History  Problem Relation Age of Onset  . Asthma Mother   . Arthritis Mother   . Depression Mother   . Cancer Father   . Emphysema Father   . Stroke Father   . Alcohol abuse Father   . Heart attack Father   . Colon cancer Neg Hx     Social History   Social History  . Marital status: Married    Spouse name: Richard  . Number of children: 2  . Years of education: Grad   Occupational History  . Psychologist, educational Retired   Social History Main Topics  . Smoking status: Never Smoker  . Smokeless tobacco: Never Used     Comment: never used tobacco  . Alcohol use 1.2 oz/week    2 Glasses of wine per week     Comment: occasionally  . Drug use: No  . Sexual activity: Not Asked   Other Topics Concern  . None   Social History Narrative   Health Care POA:    Emergency Contact: husband, Francene Finders, (c) [phone]   End of Life Plan:    Who lives with you: husband   Any pets: none   Diet: Pt has a varied diet.  Eats 5 sm. meals throughout day, focuses on protein, doesn't care for fruits and vegetables very much.   Exercise: Pt has a personal training and exercises several times a week.   Seatbelts: Pt reports wearing seatbelt when in vehicle.   Nancy Fetter Exposure/Protection: Pt reports wearing sun protection.    Hobbies: reading, visiting with friends   Patient has a Scientist, water quality.   Patient has two children.   Patient is retired.   Patient does not drink any caffeine.   Patient is right handed.             Review of Systems: A 12 point ROS discussed and pertinent positives are indicated in the HPI above.  All other systems are negative.  Review of Systems  Constitutional: Positive for activity change, appetite change and fatigue. Negative for fever and unexpected weight change.  Respiratory: Positive  for cough. Negative for shortness of breath.   Gastrointestinal: Negative for abdominal pain.  Musculoskeletal: Positive for back pain and gait problem.  Neurological: Positive for weakness.  Psychiatric/Behavioral: Negative for behavioral problems and confusion.    Vital Signs: BP 120/75   Pulse 94   Temp 97.7 F (36.5 C)   Resp 18   Ht 5\' 3"  (1.6 m)   Wt 130 lb (59 kg)   SpO2 100%   BMI 23.03 kg/m   Physical Exam  Constitutional: She is oriented to person, place, and time. She appears well-nourished.  Cardiovascular: Normal rate, regular rhythm and normal heart sounds.   Pulmonary/Chest: Effort normal and breath sounds normal. She  has no wheezes.  Abdominal: Soft. Bowel sounds are normal. There is no tenderness.  Musculoskeletal: Normal range of motion.  Severe mid to low back pain  Neurological: She is alert and oriented to person, place, and time.  Skin: Skin is warm and dry.  Psychiatric: She has a normal mood and affect. Her behavior is normal. Judgment and thought content normal.  Nursing note and vitals reviewed.   Mallampati Score:  MD Evaluation Airway: WNL Heart: WNL Abdomen: WNL Chest/ Lungs: WNL ASA  Classification: 3 Mallampati/Airway Score: One  Imaging: Dg Chest 2 View  Result Date: 05/14/2016 CLINICAL DATA:  Preoperative exam prior to kyphoplasty procedure. History of asthma, pericarditis, previous pneumonia, nonsmoker. EXAM: CHEST  2 VIEW COMPARISON:  PA and lateral chest x-ray of January 30, 2013 FINDINGS: The lungs are mildly hyperinflated. The interstitial markings are coarse. There is no alveolar infiltrate. There is no pleural effusion. The heart and pulmonary vascularity are normal. There is a compression fracture of T11 and mild superior endplate depression of L2. IMPRESSION: Mild hyperinflation may reflect reactive airway disease. No pneumonia, CHF, nor other acute cardiopulmonary abnormality. Wedge compression of T11.  Mild superior endplate  depression of L2. Electronically Signed   By: David  Martinique M.D.   On: 05/14/2016 10:39   Dg Thoracolumabar Spine  Result Date: 05/14/2016 CLINICAL DATA:  T11 kyphoplasty EXAM: DG C-ARM 61-120 MIN; THORACOLUMBAR SPINE - 2 VIEW COMPARISON:  Chest x-ray 05/14/2016 FINDINGS: 2 spot fluoro images submitted during kyphoplasty show radiopaque cement within a compressed vertebral body, consistent with the reported history of T11 kyphoplasty. No evidence for gross venous extravasation. Upper lumbar compression deformity noted on the lateral projection. IMPRESSION: Status post T11 kyphoplasty. Electronically Signed   By: Misty Stanley M.D.   On: 05/14/2016 15:16   Mr Thoracic Spine Wo Contrast  Result Date: 05/31/2016 CLINICAL DATA:  Low back pain.  Evaluate compression fractures. EXAM: MRI THORACIC AND LUMBAR SPINE WITHOUT CONTRAST TECHNIQUE: Multiplanar and multiecho pulse sequences of the thoracic and lumbar spine were obtained without intravenous contrast. COMPARISON:  Lumbar spine MRI 04/04/2016 FINDINGS: MRI THORACIC SPINE FINDINGS Alignment:  Physiologic. Vertebrae: There is persistent hypointense signal abnormality within the T11 body, which has progressed into the posterior elements and into the adjacent T10 and T12 vertebral bodies. There is spreading into the T12 pedicles. Paravertebral infiltrative appearance on the right mainly at T11. Patient had biopsy at time of vertebroplasty, which was reportedly negative for malignancy. There is mild edematous type signal around the T11 cement and in the paravertebral soft tissues. The T11 body is posteriorly bulging, but no cord impingement. Marrow is diffusely hypointense, without definite lesion elsewhere. Cord:  Normal signal and morphology. Paraspinal and other soft tissues:  As above. Disc levels: Usual degenerative changes.  No degenerative impingement. MRI LUMBAR SPINE FINDINGS Segmentation:  Standard Alignment:  Mild exaggerated kyphosis due to remote  fracture. Vertebrae: Remote L2 compression fracture with moderate height loss. No acute fracture, discitis, or infiltrative lesion. Diffuse hypointense marrow in this patient with polycythemia vera. Conus medullaris: Extends to the L1-2 level and appears normal. Paraspinal and other soft tissues: Negative Disc levels: No degenerative impingement. These results will be called to the ordering clinician or representative by the Radiologist Assistant, and communication documented in the PACS or zVision Dashboard. IMPRESSION: MR THORACIC SPINE IMPRESSION 1. Progressive infiltrative marrow lesion at the fractured and treated T11 vertebral body, extending into the posterior elements, right paravertebral soft tissues, and T10/T12 vertebrae. An infiltrative leukemia or  lymphoma is favored, consider repeat biopsy or additional testing of the prior sample. Tuberculous or other atypical infection is a less likely consideration, but microbiology may be useful at any repeat biopsy. 2. T11 mild spinal stenosis without cord impingement. 3. Diffusely hypointense marrow in this patient with polycythemia vera. MR LUMBAR SPINE IMPRESSION 1. No acute or aggressive finding. 2. Remote L2 compression fracture Electronically Signed   By: Monte Fantasia M.D.   On: 05/31/2016 13:21   Mr Lumbar Spine Wo Contrast  Result Date: 05/31/2016 CLINICAL DATA:  Low back pain.  Evaluate compression fractures. EXAM: MRI THORACIC AND LUMBAR SPINE WITHOUT CONTRAST TECHNIQUE: Multiplanar and multiecho pulse sequences of the thoracic and lumbar spine were obtained without intravenous contrast. COMPARISON:  Lumbar spine MRI 04/04/2016 FINDINGS: MRI THORACIC SPINE FINDINGS Alignment:  Physiologic. Vertebrae: There is persistent hypointense signal abnormality within the T11 body, which has progressed into the posterior elements and into the adjacent T10 and T12 vertebral bodies. There is spreading into the T12 pedicles. Paravertebral infiltrative appearance  on the right mainly at T11. Patient had biopsy at time of vertebroplasty, which was reportedly negative for malignancy. There is mild edematous type signal around the T11 cement and in the paravertebral soft tissues. The T11 body is posteriorly bulging, but no cord impingement. Marrow is diffusely hypointense, without definite lesion elsewhere. Cord:  Normal signal and morphology. Paraspinal and other soft tissues:  As above. Disc levels: Usual degenerative changes.  No degenerative impingement. MRI LUMBAR SPINE FINDINGS Segmentation:  Standard Alignment:  Mild exaggerated kyphosis due to remote fracture. Vertebrae: Remote L2 compression fracture with moderate height loss. No acute fracture, discitis, or infiltrative lesion. Diffuse hypointense marrow in this patient with polycythemia vera. Conus medullaris: Extends to the L1-2 level and appears normal. Paraspinal and other soft tissues: Negative Disc levels: No degenerative impingement. These results will be called to the ordering clinician or representative by the Radiologist Assistant, and communication documented in the PACS or zVision Dashboard. IMPRESSION: MR THORACIC SPINE IMPRESSION 1. Progressive infiltrative marrow lesion at the fractured and treated T11 vertebral body, extending into the posterior elements, right paravertebral soft tissues, and T10/T12 vertebrae. An infiltrative leukemia or lymphoma is favored, consider repeat biopsy or additional testing of the prior sample. Tuberculous or other atypical infection is a less likely consideration, but microbiology may be useful at any repeat biopsy. 2. T11 mild spinal stenosis without cord impingement. 3. Diffusely hypointense marrow in this patient with polycythemia vera. MR LUMBAR SPINE IMPRESSION 1. No acute or aggressive finding. 2. Remote L2 compression fracture Electronically Signed   By: Monte Fantasia M.D.   On: 05/31/2016 13:21   Dg C-arm 1-60 Min  Result Date: 05/14/2016 CLINICAL DATA:  T11  kyphoplasty EXAM: DG C-ARM 61-120 MIN; THORACOLUMBAR SPINE - 2 VIEW COMPARISON:  Chest x-ray 05/14/2016 FINDINGS: 2 spot fluoro images submitted during kyphoplasty show radiopaque cement within a compressed vertebral body, consistent with the reported history of T11 kyphoplasty. No evidence for gross venous extravasation. Upper lumbar compression deformity noted on the lateral projection. IMPRESSION: Status post T11 kyphoplasty. Electronically Signed   By: Misty Stanley M.D.   On: 05/14/2016 15:16    Labs:  CBC:  Recent Labs  04/27/16 1423 05/14/16 0930 05/27/16 1431 06/10/16 1049  WBC 7.8 5.5 6.6 5.4  HGB 16.8* 15.9* 16.4* 15.3*  HCT 48.2* 46.9* 47.9* 45.8  PLT 185 170 248 157    COAGS:  Recent Labs  05/14/16 0930  INR 1.00  APTT 27  BMP:  Recent Labs  10/25/15 1414  03/04/16 1427 04/27/16 1423 05/14/16 0930 05/27/16 1431  NA 139  < > 135 131* 140 137  K 3.9  < > 4.0 4.0 3.8 4.0  CL 102  --  100 101 109 105  CO2 22  < > 24 23 20* 23  GLUCOSE 99  < > 91 102* 90 94  BUN 11  < > 12 12 12 13   CALCIUM 8.8  < > 8.7 9.5 8.9 9.1  CREATININE 0.67  < > 0.56* 0.58 0.59 0.7  GFRNONAA 88  --  93 92 >60  --   GFRAA 102  --  108 106 >60  --   < > = values in this interval not displayed.  LIVER FUNCTION TESTS:  Recent Labs  03/04/16 1427 04/27/16 1423 05/14/16 0930 05/27/16 1431  BILITOT 0.6 0.6 1.1 0.80  AST 40 32 30 32  ALT 32 24 20 26   ALKPHOS 71 93 75 100*  PROT 6.8 6.6  7.0 6.7 7.3  ALBUMIN 3.7 3.8 3.5 3.4    TUMOR MARKERS: No results for input(s): AFPTM, CEA, CA199, CHROMGRNA in the last 8760 hours.  Assessment and Plan:  Severe back pain No injury Hx T11 KP with Dr Lynann Bologna weeks ago---no sustained relief New T10 and 12 lesions noted on MRI---worrisome for malignancy Scheduled now for biopsy of these lesions Risks and Benefits discussed with the patient including, but not limited to bleeding, infection, damage to adjacent structures or low yield  requiring additional tests. All of the patient's questions were answered, patient is agreeable to proceed. Consent signed and in chart.  Thank you for this interesting consult.  I greatly enjoyed meeting [name] and look forward to participating in their care.  A copy of this report was sent to the requesting provider on this date.  Electronically Signed: Kazzandra Desaulniers A 06/10/2016, 11:23 AM   I spent a total of  30 Minutes   in face to face in clinical consultation, greater than 50% of which was counseling/coordinating care for T10 and 12 Bx

## 2016-06-12 ENCOUNTER — Encounter (HOSPITAL_COMMUNITY): Payer: Self-pay | Admitting: Interventional Radiology

## 2016-06-12 NOTE — Telephone Encounter (Signed)
Pt is aware that we are still awaiting response from R[facility], as he has been out of office.

## 2016-06-12 NOTE — Telephone Encounter (Signed)
Patient called back requesting to speak to nurse about medication (Symbicort)...ert

## 2016-06-15 ENCOUNTER — Other Ambulatory Visit: Payer: Self-pay | Admitting: Orthopedic Surgery

## 2016-06-16 ENCOUNTER — Ambulatory Visit (HOSPITAL_COMMUNITY): Payer: Medicare Other

## 2016-06-16 ENCOUNTER — Other Ambulatory Visit: Payer: Self-pay | Admitting: Hematology & Oncology

## 2016-06-16 ENCOUNTER — Telehealth: Payer: Self-pay | Admitting: Family

## 2016-06-16 NOTE — Telephone Encounter (Signed)
I believe that the benefits of Symbicort far out weight any potential risks. I want her to be reassured that it is the right move for [place] to continue. We can discuss further at our [facility], but I don't want her to stop it

## 2016-06-16 NOTE — Telephone Encounter (Signed)
Called to let [name] know about her biopsy results but she had already spoken with Dr. Lynann Bologna and will be having another kyphoplasty on 5/24. No questions at this time.

## 2016-06-16 NOTE — Telephone Encounter (Signed)
Called and spoke to pt. Informed her of the recs per RB. Pt verbalized understanding and denied any further questions or concerns at this time.   

## 2016-06-17 ENCOUNTER — Encounter (HOSPITAL_COMMUNITY): Payer: Self-pay | Admitting: *Deleted

## 2016-06-17 NOTE — Progress Notes (Signed)
Pt denies SOB and chest pain. t under the care of Dr. Gwenlyn Found Cardiology. Pt denies having an echo. Pt made aware to stop taking Aspirin, vitamins, fish oil and herbal medications. Do not take any NSAIDs ie: Ibuprofen, Advil, Naproxen, BC and Goody Powder or any medication containing Aspirin. Pt verbalized understanding of all pre-op instructions.

## 2016-06-17 NOTE — H&P (Signed)
PREOPERATIVE H&P  Chief Complaint: Mid back pain  HPI: [name] is a 71 y.o. female who presents with ongoing pain in the mid back  MRI reveals edema in the T12 vertebral body c/w a compression fx. Workup has been negative for metastatic disease.  Patient has failed multiple forms of conservative care and continues to have pain (see office notes for additional details regarding the patient's full course of treatment)  Past Medical History:  Diagnosis Date  . Allergy   . Anemia   . Anxiety   . Asthma    DR. BYRUM  . Blood transfusion without reported diagnosis   . Cholelithiasis   . Chronic kidney disease    gallstones   . Depression   . Dyspnea   . Dysrhythmia    palpitations  . GERD (gastroesophageal reflux disease)   . Heart attack ([place])   . Hemorrhoids   . IBS (irritable bowel syndrome)   . Iliotibial band syndrome    left knee  . Migraine   . Mitochondrial myopathy   . Normal coronary arteries    by cardiac catheterization performed by myself 02/25/01  . Osteoporosis   . Pericarditis    age 54  . Pneumothorax   . 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 PVC's (premature ventricular contractions)   . Vitamin D deficiency    Past Surgical History:  Procedure Laterality Date  . APPENDECTOMY    . CARDIAC CATHETERIZATION    . CATARACT EXTRACTION W/ INTRAOCULAR LENS  IMPLANT, BILATERAL    . CESAREAN SECTION     x 4  . COLONOSCOPY    . DILATATION & CURETTAGE/HYSTEROSCOPY WITH MYOSURE N/A 10/29/2014   Procedure: DILATATION & CURETTAGE/HYSTEROSCOPY WITH MYOSURE;  Surgeon: Paula Compton, MD;  Location: [facility];  Service: Gynecology;  Laterality: N/A;  . IR FLUORO GUIDED NEEDLE PLC ASPIRATION/INJECTION LOC  06/10/2016  . IR FLUORO GUIDED NEEDLE PLC ASPIRATION/INJECTION LOC  06/10/2016  . KYPHOPLASTY N/A 05/14/2016   Procedure: T 11 KYPHOPLASTY;  Surgeon: Phylliss Bob, MD;  Location: [facility];  Service: Orthopedics;  Laterality: N/A;  T 11 KYPHOPLASTY  .  MOUTH SURGERY    . NASAL SINUS SURGERY    . NM MYOCAR PERF WALL MOTION  04/27/2006   normal  . US ECHOCARDIOGRAPHY  12/25/2010   normal  . VEIN SURGERY     Social History   Social History  . Marital status: Married    Spouse name: Richard  . Number of children: 2  . Years of education: Grad   Occupational History  . Psychologist, educational Retired   Social History Main Topics  . Smoking status: Never Smoker  . Smokeless tobacco: Never Used     Comment: never used tobacco  . Alcohol use 1.2 oz/week    2 Glasses of wine per week     Comment: occasionally  . Drug use: No  . Sexual activity: Not on file   Other Topics Concern  . Not on file   Social History Narrative   Health Care POA:    Emergency Contact: husband, Francene Finders, (c) [phone]   End of Life Plan:    Who lives with you: husband   Any pets: none   Diet: Pt has a varied diet.  Eats 5 sm. meals throughout day, focuses on protein, doesn't care for fruits and vegetables very much.   Exercise: Pt has a personal training and exercises several times a week.   Seatbelts: Pt reports wearing seatbelt  when in vehicle.   Nancy Fetter Exposure/Protection: Pt reports wearing sun protection.    Hobbies: reading, visiting with friends   Patient has a Scientist, water quality.   Patient has two children.   Patient is retired.   Patient does not drink any caffeine.   Patient is right handed.            Family History  Problem Relation Age of Onset  . Asthma Mother   . Arthritis Mother   . Depression Mother   . Cancer Father   . Emphysema Father   . Stroke Father   . Alcohol abuse Father   . Heart attack Father   . Colon cancer Neg Hx    Allergies  Allergen Reactions  . Dulera [Mometasone Furo-Formoterol Fum] Cough   Prior to Admission medications   Medication Sig Start Date End Date Taking? Authorizing Provider  albuterol (PROVENTIL HFA;VENTOLIN HFA) 108 (90 Base) MCG/ACT inhaler Inhale 2 puffs into the lungs every 4 (four) hours  as needed for wheezing or shortness of breath. 01/15/16   Collene Gobble, MD  budesonide-formoterol (SYMBICORT) 160-4.5 MCG/ACT inhaler Inhale 2 puffs into the lungs 2 (two) times daily. 04/10/16   Collene Gobble, MD  cyclobenzaprine (FLEXERIL) 5 MG tablet Take 1 tablet by mouth daily as needed. 06/02/16   [provider]  Estradiol-Norethindrone Acet (LOPREEZA) 0.5-0.1 MG per tablet Take 1 tablet by mouth daily.    [provider]  LORazepam (ATIVAN) 0.5 MG tablet TAKE 1 TABLET BY MOUTH EVERY NIGHT AT BEDTIME 05/01/14   Leandrew Koyanagi, MD  pantoprazole (PROTONIX) 40 MG tablet Take 40 mg by mouth daily at 8 pm. 11/08/15   [provider]  sertraline (ZOLOFT) 100 MG tablet Take 100 mg by mouth at bedtime.  03/09/16   [provider]  Vitamin D, Ergocalciferol, (DRISDOL) 50000 UNITS CAPS capsule Take 50,000 Units by mouth every 7 (seven) days. Sundays    [provider]     All other systems have been reviewed and were otherwise negative with the exception of those mentioned in the HPI and as above.  Physical Exam: There were no vitals filed for this visit.  General: Alert, no acute distress Cardiovascular: No pedal edema Respiratory: No cyanosis, no use of accessory musculature Skin: No lesions in the area of chief complaint Neurologic: Sensation intact distally Psychiatric: Patient is competent for consent with normal mood and affect Lymphatic: No axillary or cervical lymphadenopathy  MUSCULOSKELETAL: + TTP mid back  Assessment/Plan: T12 compression fracture Plan for Procedure(s): THORACIC 12 KYPHOPLASTY   Sinclair Ship, MD 06/17/2016 7:12 AM

## 2016-06-18 ENCOUNTER — Encounter (HOSPITAL_COMMUNITY)
Admission: RE | Disposition: A | Discharge status: Home / Self Care | Payer: Self-pay | Source: Ambulatory Visit | Attending: Orthopedic Surgery

## 2016-06-18 ENCOUNTER — Ambulatory Visit (HOSPITAL_COMMUNITY): Payer: Medicare Other

## 2016-06-18 ENCOUNTER — Encounter (HOSPITAL_COMMUNITY): Payer: Self-pay | Admitting: General Practice

## 2016-06-18 ENCOUNTER — Ambulatory Visit (HOSPITAL_COMMUNITY): Payer: Medicare Other | Admitting: Certified Registered"

## 2016-06-18 ENCOUNTER — Ambulatory Visit (HOSPITAL_COMMUNITY)
Admission: RE | Admit: 2016-06-18 | Discharge: 2016-06-18 | Disposition: A | Discharge status: Home / Self Care | Payer: Medicare Other | Source: Ambulatory Visit | Attending: Orthopedic Surgery | Admitting: Orthopedic Surgery

## 2016-06-18 DIAGNOSIS — J45909 Unspecified asthma, uncomplicated: Secondary | ICD-10-CM | POA: Insufficient documentation

## 2016-06-18 DIAGNOSIS — K589 Irritable bowel syndrome without diarrhea: Secondary | ICD-10-CM | POA: Diagnosis not present

## 2016-06-18 DIAGNOSIS — F418 Other specified anxiety disorders: Secondary | ICD-10-CM | POA: Diagnosis not present

## 2016-06-18 DIAGNOSIS — S22080A Wedge compression fracture of T11-T12 vertebra, initial encounter for closed fracture: Secondary | ICD-10-CM | POA: Diagnosis not present

## 2016-06-18 DIAGNOSIS — F329 Major depressive disorder, single episode, unspecified: Secondary | ICD-10-CM | POA: Insufficient documentation

## 2016-06-18 DIAGNOSIS — Z79899 Other long term (current) drug therapy: Secondary | ICD-10-CM | POA: Diagnosis not present

## 2016-06-18 DIAGNOSIS — N189 Chronic kidney disease, unspecified: Secondary | ICD-10-CM | POA: Diagnosis not present

## 2016-06-18 DIAGNOSIS — F419 Anxiety disorder, unspecified: Secondary | ICD-10-CM | POA: Insufficient documentation

## 2016-06-18 DIAGNOSIS — Z981 Arthrodesis status: Secondary | ICD-10-CM | POA: Diagnosis not present

## 2016-06-18 DIAGNOSIS — D45 Polycythemia vera: Secondary | ICD-10-CM | POA: Insufficient documentation

## 2016-06-18 DIAGNOSIS — E559 Vitamin D deficiency, unspecified: Secondary | ICD-10-CM | POA: Diagnosis not present

## 2016-06-18 DIAGNOSIS — K219 Gastro-esophageal reflux disease without esophagitis: Secondary | ICD-10-CM | POA: Diagnosis not present

## 2016-06-18 DIAGNOSIS — I252 Old myocardial infarction: Secondary | ICD-10-CM | POA: Insufficient documentation

## 2016-06-18 DIAGNOSIS — M4854XA Collapsed vertebra, not elsewhere classified, thoracic region, initial encounter for fracture: Secondary | ICD-10-CM | POA: Insufficient documentation

## 2016-06-18 DIAGNOSIS — Z419 Encounter for procedure for purposes other than remedying health state, unspecified: Secondary | ICD-10-CM

## 2016-06-18 DIAGNOSIS — Z01818 Encounter for other preprocedural examination: Secondary | ICD-10-CM

## 2016-06-18 DIAGNOSIS — J449 Chronic obstructive pulmonary disease, unspecified: Secondary | ICD-10-CM | POA: Diagnosis not present

## 2016-06-18 DIAGNOSIS — S32020A Wedge compression fracture of second lumbar vertebra, initial encounter for closed fracture: Secondary | ICD-10-CM | POA: Diagnosis not present

## 2016-06-18 HISTORY — PX: KYPHOPLASTY: SHX5884

## 2016-06-18 HISTORY — DX: Wedge compression fracture of unspecified thoracic vertebra, initial encounter for closed fracture: S22.000A

## 2016-06-18 LAB — COMPREHENSIVE METABOLIC PANEL
ALT: 20 U/L (ref 14–54)
AST: 34 U/L (ref 15–41)
Albumin: 3.8 g/dL (ref 3.5–5.0)
Alkaline Phosphatase: 117 U/L (ref 38–126)
Anion gap: 9 (ref 5–15)
BUN: 9 mg/dL (ref 6–20)
CO2: 24 mmol/L (ref 22–32)
Calcium: 8.5 mg/dL — ABNORMAL LOW (ref 8.9–10.3)
Chloride: 106 mmol/L (ref 101–111)
Creatinine, Ser: 0.61 mg/dL (ref 0.44–1.00)
GFR calc Af Amer: >60 mL/min (ref >60)
GFR calc non Af Amer: >60 mL/min (ref >60)
Glucose, Bld: 92 mg/dL (ref 65–99)
Potassium: 3.9 mmol/L (ref 3.5–5.1)
Sodium: 139 mmol/L (ref 135–145)
Total Bilirubin: 1.1 mg/dL (ref 0.3–1.2)
Total Protein: 7 g/dL (ref 6.5–8.1)

## 2016-06-18 LAB — CBC WITH DIFFERENTIAL/PLATELET
Basophils Absolute: 0 10*3/uL (ref 0.0–0.1)
Basophils Relative: 1 %
Eosinophils Absolute: 0.2 10*3/uL (ref 0.0–0.7)
Eosinophils Relative: 3 %
HCT: 49.1 % — ABNORMAL HIGH (ref 36.0–46.0)
Hemoglobin: 17 g/dL — ABNORMAL HIGH (ref 12.0–15.0)
Lymphocytes Relative: 16 %
Lymphs Abs: 0.9 10*3/uL (ref 0.7–4.0)
MCH: 35.1 pg — ABNORMAL HIGH (ref 26.0–34.0)
MCHC: 34.6 g/dL (ref 30.0–36.0)
MCV: 101.4 fL — ABNORMAL HIGH (ref 78.0–100.0)
Monocytes Absolute: 0.5 10*3/uL (ref 0.1–1.0)
Monocytes Relative: 9 %
Neutro Abs: 3.8 10*3/uL (ref 1.7–7.7)
Neutrophils Relative %: 71 %
Platelets: 185 10*3/uL (ref 150–400)
RBC: 4.84 MIL/uL (ref 3.87–5.11)
RDW: 14.3 % (ref 11.5–15.5)
WBC: 5.4 10*3/uL (ref 4.0–10.5)

## 2016-06-18 LAB — SURGICAL PCR SCREEN
MRSA, PCR: NEGATIVE
Staphylococcus aureus: NEGATIVE

## 2016-06-18 SURGERY — KYPHOPLASTY
Anesthesia: General | Site: Spine Thoracic

## 2016-06-18 MED ORDER — HYDROMORPHONE HCL 1 MG/ML IJ SOLN
0.2500 mg | INTRAMUSCULAR | Status: DC | PRN
  Administered 2016-06-18 (×2): 0.5 mg via INTRAVENOUS

## 2016-06-18 MED ORDER — MEPERIDINE HCL 25 MG/ML IJ SOLN: 6.2500 mg | INTRAMUSCULAR | Status: DC | PRN

## 2016-06-18 MED ORDER — HYDROMORPHONE HCL 1 MG/ML IJ SOLN
INTRAMUSCULAR | Status: AC
  Filled 2016-06-18: qty 1

## 2016-06-18 MED ORDER — CEFAZOLIN SODIUM-DEXTROSE 2-4 GM/100ML-% IV SOLN
2.0000 g | INTRAVENOUS | Status: AC
  Administered 2016-06-18: 2 g via INTRAVENOUS
  Filled 2016-06-18: qty 100

## 2016-06-18 MED ORDER — PHENYLEPHRINE 40 MCG/ML (10ML) SYRINGE FOR IV PUSH (FOR BLOOD PRESSURE SUPPORT)
PREFILLED_SYRINGE | INTRAVENOUS | Status: DC | PRN
  Administered 2016-06-18 (×2): 80 ug via INTRAVENOUS
  Administered 2016-06-18: 40 ug via INTRAVENOUS
  Administered 2016-06-18: 80 ug via INTRAVENOUS

## 2016-06-18 MED ORDER — LACTATED RINGERS IV SOLN
INTRAVENOUS | Status: DC
  Administered 2016-06-18 (×2): via INTRAVENOUS

## 2016-06-18 MED ORDER — IOPAMIDOL (ISOVUE-300) INJECTION 61%
INTRAVENOUS | Status: DC | PRN
  Administered 2016-06-18: 50 mL

## 2016-06-18 MED ORDER — PROMETHAZINE HCL 25 MG/ML IJ SOLN: 6.2500 mg | INTRAMUSCULAR | Status: DC | PRN

## 2016-06-18 MED ORDER — POVIDONE-IODINE 7.5 % EX SOLN: Freq: Once | CUTANEOUS | Status: DC

## 2016-06-18 MED ORDER — ONDANSETRON HCL 4 MG/2ML IJ SOLN
INTRAMUSCULAR | Status: AC
  Filled 2016-06-18: qty 2

## 2016-06-18 MED ORDER — IOPAMIDOL (ISOVUE-300) INJECTION 61%
INTRAVENOUS | Status: AC
  Filled 2016-06-18: qty 50

## 2016-06-18 MED ORDER — FENTANYL CITRATE (PF) 250 MCG/5ML IJ SOLN
INTRAMUSCULAR | Status: DC | PRN
  Administered 2016-06-18 (×2): 50 ug via INTRAVENOUS
  Administered 2016-06-18: 100 ug via INTRAVENOUS

## 2016-06-18 MED ORDER — LIDOCAINE 2% (20 MG/ML) 5 ML SYRINGE
INTRAMUSCULAR | Status: AC
  Filled 2016-06-18: qty 5

## 2016-06-18 MED ORDER — BACITRACIN ZINC 500 UNIT/GM EX OINT
TOPICAL_OINTMENT | CUTANEOUS | Status: AC
  Filled 2016-06-18: qty 28.35

## 2016-06-18 MED ORDER — ROCURONIUM BROMIDE 10 MG/ML (PF) SYRINGE
PREFILLED_SYRINGE | INTRAVENOUS | Status: DC | PRN
  Administered 2016-06-18 (×2): 10 mg via INTRAVENOUS
  Administered 2016-06-18: 30 mg via INTRAVENOUS

## 2016-06-18 MED ORDER — BACITRACIN 500 UNIT/GM EX OINT
TOPICAL_OINTMENT | CUTANEOUS | Status: DC | PRN
  Administered 2016-06-18: 1 via TOPICAL

## 2016-06-18 MED ORDER — BUPIVACAINE HCL (PF) 0.25 % IJ SOLN
INTRAMUSCULAR | Status: AC
  Filled 2016-06-18: qty 30

## 2016-06-18 MED ORDER — SUGAMMADEX SODIUM 200 MG/2ML IV SOLN
INTRAVENOUS | Status: DC | PRN
  Administered 2016-06-18: 200 mg via INTRAVENOUS

## 2016-06-18 MED ORDER — PHENYLEPHRINE 40 MCG/ML (10ML) SYRINGE FOR IV PUSH (FOR BLOOD PRESSURE SUPPORT)
PREFILLED_SYRINGE | INTRAVENOUS | Status: AC
  Filled 2016-06-18: qty 10

## 2016-06-18 MED ORDER — LIDOCAINE 2% (20 MG/ML) 5 ML SYRINGE
INTRAMUSCULAR | Status: DC | PRN
  Administered 2016-06-18: 60 mg via INTRAVENOUS

## 2016-06-18 MED ORDER — PROPOFOL 10 MG/ML IV BOLUS
INTRAVENOUS | Status: DC | PRN
  Administered 2016-06-18: 100 mg via INTRAVENOUS

## 2016-06-18 MED ORDER — ROCURONIUM BROMIDE 10 MG/ML (PF) SYRINGE
PREFILLED_SYRINGE | INTRAVENOUS | Status: AC
  Filled 2016-06-18: qty 5

## 2016-06-18 MED ORDER — HYDROMORPHONE HCL 1 MG/ML IJ SOLN
INTRAMUSCULAR | Status: AC
  Filled 2016-06-18: qty 0.5

## 2016-06-18 MED ORDER — FENTANYL CITRATE (PF) 250 MCG/5ML IJ SOLN
INTRAMUSCULAR | Status: AC
  Filled 2016-06-18: qty 5

## 2016-06-18 MED ORDER — BUPIVACAINE LIPOSOME 1.3 % IJ SUSP
20.0000 mL | INTRAMUSCULAR | Status: DC
  Filled 2016-06-18: qty 20

## 2016-06-18 MED ORDER — ONDANSETRON HCL 4 MG/2ML IJ SOLN
INTRAMUSCULAR | Status: DC | PRN
  Administered 2016-06-18: 4 mg via INTRAVENOUS

## 2016-06-18 MED ORDER — EPINEPHRINE PF 1 MG/ML IJ SOLN
INTRAMUSCULAR | Status: AC
  Filled 2016-06-18: qty 1

## 2016-06-18 MED ORDER — PROPOFOL 10 MG/ML IV BOLUS
INTRAVENOUS | Status: AC
  Filled 2016-06-18: qty 20

## 2016-06-18 MED ORDER — MIDAZOLAM HCL 2 MG/2ML IJ SOLN
INTRAMUSCULAR | Status: AC
  Filled 2016-06-18: qty 2

## 2016-06-18 MED ORDER — MIDAZOLAM HCL 2 MG/2ML IJ SOLN
INTRAMUSCULAR | Status: DC | PRN
  Administered 2016-06-18: 2 mg via INTRAVENOUS

## 2016-06-18 MED ORDER — SUGAMMADEX SODIUM 200 MG/2ML IV SOLN
INTRAVENOUS | Status: AC
  Filled 2016-06-18: qty 2

## 2016-06-18 SURGICAL SUPPLY — 44 items
BLADE SURG 15 STRL LF DISP TIS (BLADE) ×1 IMPLANT
BLADE SURG 15 STRL SS (BLADE) ×3
CEMENT BONE KYPHON CDS (Cement) ×2 IMPLANT
COVER MAYO STAND STRL (DRAPES) ×3 IMPLANT
COVER SURGICAL LIGHT HANDLE (MISCELLANEOUS) ×3 IMPLANT
CURETTE EXPRESS SZ2 7MM (INSTRUMENTS) IMPLANT
CURRETTE EXPRESS SZ2 7MM (INSTRUMENTS) ×3
DRAPE C-ARM 42X72 X-RAY (DRAPES) ×3 IMPLANT
DRAPE HALF SHEET 40X57 (DRAPES) IMPLANT
DRAPE INCISE IOBAN 66X45 STRL (DRAPES) ×3 IMPLANT
DRAPE LAPAROTOMY T 102X78X121 (DRAPES) ×3 IMPLANT
DRAPE SURG 17X23 STRL (DRAPES) ×12 IMPLANT
DURAPREP 26ML APPLICATOR (WOUND CARE) ×3 IMPLANT
GAUZE SPONGE 2X2 8PLY STRL LF (GAUZE/BANDAGES/DRESSINGS) IMPLANT
GAUZE SPONGE 4X4 16PLY XRAY LF (GAUZE/BANDAGES/DRESSINGS) ×3 IMPLANT
GLOVE BIO SURGEON STRL SZ7 (GLOVE) ×3 IMPLANT
GLOVE BIO SURGEON STRL SZ8 (GLOVE) ×3 IMPLANT
GLOVE BIOGEL PI IND STRL 7.0 (GLOVE) ×1 IMPLANT
GLOVE BIOGEL PI IND STRL 8 (GLOVE) ×1 IMPLANT
GLOVE BIOGEL PI INDICATOR 7.0 (GLOVE) ×2
GLOVE BIOGEL PI INDICATOR 8 (GLOVE) ×2
GOWN STRL REUS W/ TWL LRG LVL3 (GOWN DISPOSABLE) ×2 IMPLANT
GOWN STRL REUS W/ TWL XL LVL3 (GOWN DISPOSABLE) ×1 IMPLANT
GOWN STRL REUS W/TWL LRG LVL3 (GOWN DISPOSABLE) ×6
GOWN STRL REUS W/TWL XL LVL3 (GOWN DISPOSABLE) ×3
KIT BASIN OR (CUSTOM PROCEDURE TRAY) ×3 IMPLANT
KIT ROOM TURNOVER OR (KITS) ×3 IMPLANT
NDL HYPO 25X1 1.5 SAFETY (NEEDLE) IMPLANT
NDL SPNL 18GX3.5 QUINCKE PK (NEEDLE) ×2 IMPLANT
NEEDLE 22X1 1/2 (OR ONLY) (NEEDLE) IMPLANT
NEEDLE HYPO 25X1 1.5 SAFETY (NEEDLE) IMPLANT
NEEDLE SPNL 18GX3.5 QUINCKE PK (NEEDLE) ×6 IMPLANT
NS IRRIG 1000ML POUR BTL (IV SOLUTION) ×3 IMPLANT
PACK UNIVERSAL I (CUSTOM PROCEDURE TRAY) ×3 IMPLANT
PAD ARMBOARD 7.5X6 YLW CONV (MISCELLANEOUS) ×6 IMPLANT
POSITIONER HEAD PRONE TRACH (MISCELLANEOUS) ×3 IMPLANT
SPONGE GAUZE 2X2 STER 10/PKG (GAUZE/BANDAGES/DRESSINGS) ×2
SUT MNCRL AB 4-0 PS2 18 (SUTURE) ×3 IMPLANT
SYR BULB IRRIGATION 50ML (SYRINGE) ×3 IMPLANT
SYR CONTROL 10ML LL (SYRINGE) ×3 IMPLANT
TAPE CLOTH SURG 4X10 WHT LF (GAUZE/BANDAGES/DRESSINGS) ×2 IMPLANT
TOWEL OR 17X24 6PK STRL BLUE (TOWEL DISPOSABLE) ×3 IMPLANT
TOWEL OR 17X26 10 PK STRL BLUE (TOWEL DISPOSABLE) ×3 IMPLANT
TRAY KYPHOPAK 15/3 ONESTEP 1ST (MISCELLANEOUS) ×2 IMPLANT

## 2016-06-18 NOTE — Anesthesia Postprocedure Evaluation (Signed)
Anesthesia Post Note  Patient: [name]  Procedure(s) Performed: Procedure(s) (LRB): THORACIC 12 KYPHOPLASTY (N/A)  Patient location during evaluation: PACU Anesthesia Type: General Level of consciousness: awake and alert and oriented Pain management: pain level controlled Vital Signs Assessment: post-procedure vital signs reviewed and stable Respiratory status: spontaneous breathing, nonlabored ventilation, respiratory function stable and patient connected to nasal cannula oxygen Cardiovascular status: blood pressure returned to baseline and stable Postop Assessment: no signs of nausea or vomiting Anesthetic complications: no       Last Vitals:  Vitals:   06/18/16 1300 06/18/16 1315  BP: 109/61 114/60  Pulse: 84 78  Resp: 18 18  Temp:      Last Pain:  Vitals:   06/18/16 1315  PainSc: 5                  Hiroto Saltzman A.

## 2016-06-18 NOTE — Anesthesia Procedure Notes (Signed)
Procedure Name: Intubation Date/Time: 06/18/2016 11:30 AM Performed by: Teressa Lower Pre-anesthesia Checklist: Patient identified, Emergency Drugs available, Suction available and Patient being monitored Patient Re-evaluated:Patient Re-evaluated prior to inductionOxygen Delivery Method: Circle system utilized Preoxygenation: Pre-oxygenation with 100% oxygen Intubation Type: IV induction Ventilation: Mask ventilation without difficulty Laryngoscope Size: Mac and 3 Grade View: Grade II Tube type: Oral Tube size: 7.0 mm Number of attempts: 1 Airway Equipment and Method: Stylet and Oral airway Placement Confirmation: ETT inserted through vocal cords under direct vision,  positive ETCO2 and breath sounds checked- equal and bilateral Secured at: 21 cm Tube secured with: Tape Dental Injury: Teeth and Oropharynx as per pre-operative assessment

## 2016-06-18 NOTE — Op Note (Signed)
NAMEDIXIE, COPPA NO.:  [id number]  MEDICAL RECORD NO.:  [id number]  LOCATION:  PERIO                        FACILITY:  [facility]  PHYSICIAN:  Phylliss Bob, MD      DATE OF BIRTH:  1945-02-24  DATE OF PROCEDURE:  06/18/2016                              OPERATIVE REPORT   PREOPERATIVE DIAGNOSIS:  T12 compression fracture.  POSTOPERATIVE DIAGNOSIS:  T12 compression fracture.  PROCEDURE:  T12 kyphoplasty.  SURGEON:  Phylliss Bob, MD.  ASSISTANTPricilla Holm, PA-C.  ANESTHESIA:  General endotracheal anesthesia.  COMPLICATIONS:  None.  DISPOSITION:  Stable.  ESTIMATED BLOOD LOSS:  Minimal.  INDICATIONS FOR SURGERY:  Briefly, [name] is a pleasant 71 year old female, who did initially present to me with severe pain in her back. She did have a T11 compression fracture and did undergo a kyphoplasty procedure for that, and did very well with complete resolution of her pain.  Shortly thereafter, she had additional pain and an MRI and repeat x-rays did reveal an additional acute compression fracture at T12.  A biopsy did rule out a pathologic fracture.  Given how well she did after the first surgery, she was very much interested in proceeding with an additional kyphoplasty.  We did go ahead and get her set up with a kyphoplasty procedure, after a full understanding of the risks and benefits of surgery.  OPERATIVE DETAILS:  On Jun 18, 2016, the patient was brought to surgery and general endotracheal anesthesia was administered.  The patient was placed prone on a well-padded flat Jackson bed.  Gel rolls were placed under the patient's chest and hips.  Antibiotics were given.  The back was prepped and draped and 2 small stab incisions were made at the upper and lateral aspects of the T12 pedicles.  Jamshidi's were advanced into the T12 pedicles bilaterally and into the vertebral bodies.  Kyphoplasty balloons were inserted after using a drill and a  curette through each side.  The balloon easily inflated and I did note excellent restoration of the superior endplate.  I did note excellent restoration of the height of the superior endplate.  The balloons were then deflated and a total of approximately 5 mL of cement was introduced into the vertebral body at T12.  I was very pleased with the interdigitation of the cement. There was no abnormal extravasation of cement.  The cement was then allowed to harden and the Jamshidi's were removed.  The wound was then closed using 4-0 Monocryl.  Bacitracin and sterile dressings were applied and the patient was then awoken from general endotracheal anesthesia and transferred to recovery in stable condition.     Phylliss Bob, MD     MD/MEDQ  D:  06/18/2016  T:  06/18/2016  Job:  [id number]  cc:   [name], M.D.

## 2016-06-18 NOTE — Transfer of Care (Signed)
Immediate Anesthesia Transfer of Care Note  Patient: Katheran Awe  Procedure(s) Performed: Procedure(s) with comments: THORACIC 12 KYPHOPLASTY (N/A) - THORACIC 12 KYPHOPLASTY; REQUEST 1 HOUR AND FLIP ROOM  Patient Location: PACU  Anesthesia Type:General  Level of Consciousness: awake, alert  and oriented  Airway & Oxygen Therapy: Patient Spontanous Breathing and Patient connected to nasal cannula oxygen  Post-op Assessment: Report given to RN and Post -op Vital signs reviewed and stable  Post vital signs: Reviewed and stable  Last Vitals:  Vitals:   06/18/16 0823 06/18/16 1249  BP: (!) 130/52 122/64  Pulse: 78 95  Resp: 18 12  Temp: 36.5 C 36.6 C    Last Pain:  Vitals:   06/18/16 1249  PainSc: (P) 0-No pain      Patients Stated Pain Goal: 2 (94/70/76 1518)  Complications: No apparent anesthesia complications

## 2016-06-18 NOTE — Anesthesia Preprocedure Evaluation (Signed)
Anesthesia Evaluation  Patient identified by MRN, date of birth, ID band Patient awake    Reviewed: Allergy & Precautions, NPO status , Patient's Chart, lab work & pertinent test results  Airway Mallampati: II  TM Distance: >3 FB     Dental   Pulmonary shortness of breath, asthma , COPD,    Pulmonary exam normal breath sounds clear to auscultation       Cardiovascular + Past MI and + DOE  Normal cardiovascular exam+ dysrhythmias  Rhythm:Regular Rate:Normal     Neuro/Psych  Headaches, PSYCHIATRIC DISORDERS Anxiety Depression Tremor  Neuromuscular disease    GI/Hepatic Neg liver ROS, GERD  Medicated and Controlled,Cholelithiasis IBS   Endo/Other  negative endocrine ROS  Renal/GU Renal disease  negative genitourinary   Musculoskeletal negative musculoskeletal ROS (+) Osteoporosis T12 compression Fx   Abdominal   Peds  Hematology  (+) anemia , Polycythemia vera Hx/o blood transfusion   Anesthesia Other Findings   Reproductive/Obstetrics                             Anesthesia Physical  Anesthesia Plan  ASA: III  Anesthesia Plan: General   Post-op Pain Management:    Induction: Intravenous  Airway Management Planned: Oral ETT  Additional Equipment:   Intra-op Plan:   Post-operative Plan: Extubation in OR  Informed Consent: I have reviewed the patients History and Physical, chart, labs and discussed the procedure including the risks, benefits and alternatives for the proposed anesthesia with the patient or authorized representative who has indicated his/her understanding and acceptance.   Dental advisory given  Plan Discussed with: CRNA and Anesthesiologist  Anesthesia Plan Comments:         Anesthesia Quick Evaluation

## 2016-06-19 ENCOUNTER — Encounter (HOSPITAL_COMMUNITY): Payer: Self-pay | Admitting: Orthopedic Surgery

## 2016-06-26 ENCOUNTER — Ambulatory Visit (HOSPITAL_BASED_OUTPATIENT_CLINIC_OR_DEPARTMENT_OTHER)
Admission: RE | Admit: 2016-06-26 | Discharge: 2016-06-26 | Disposition: A | Discharge status: Home / Self Care | Payer: Medicare Other | Source: Ambulatory Visit | Attending: Family | Admitting: Family

## 2016-06-26 ENCOUNTER — Other Ambulatory Visit: Payer: Self-pay | Admitting: Family

## 2016-06-26 ENCOUNTER — Telehealth: Payer: Self-pay | Admitting: *Deleted

## 2016-06-26 DIAGNOSIS — M7989 Other specified soft tissue disorders: Secondary | ICD-10-CM | POA: Diagnosis not present

## 2016-06-26 DIAGNOSIS — D45 Polycythemia vera: Secondary | ICD-10-CM

## 2016-06-26 DIAGNOSIS — M79604 Pain in right leg: Secondary | ICD-10-CM | POA: Diagnosis not present

## 2016-06-26 DIAGNOSIS — R600 Localized edema: Secondary | ICD-10-CM | POA: Diagnosis not present

## 2016-06-26 NOTE — Telephone Encounter (Signed)
Patient c/o pain to Right Leg on her shin near her ankle. It's very sore with redness, warmth, and some swelling. She's had phlebitis in the past and thinks this is similar to that.   Reviewed symptoms with Laverna Peace NP and she will place orders for a doppler. Patient aware and number given for her to schedule appointment. NP will follow up with results and any possible further treatment.

## 2016-06-29 ENCOUNTER — Telehealth: Payer: Self-pay | Admitting: *Deleted

## 2016-06-29 NOTE — Telephone Encounter (Addendum)
Patient is aware of results  ----- Message from Eliezer Bottom, NP sent at 06/29/2016  8:27 AM EDT ----- Regarding: [place] No DVT! Thank you!  [name]   ----- Message ----- From: Buel Ream, Rad Results In Sent: 06/26/2016   5:06 PM To: Eliezer Bottom, NP

## 2016-07-01 DIAGNOSIS — Z9889 Other specified postprocedural states: Secondary | ICD-10-CM | POA: Diagnosis not present

## 2016-07-08 ENCOUNTER — Ambulatory Visit (HOSPITAL_BASED_OUTPATIENT_CLINIC_OR_DEPARTMENT_OTHER): Payer: Medicare Other | Admitting: Hematology & Oncology

## 2016-07-08 ENCOUNTER — Ambulatory Visit: Payer: Medicare Other

## 2016-07-08 ENCOUNTER — Ambulatory Visit (HOSPITAL_BASED_OUTPATIENT_CLINIC_OR_DEPARTMENT_OTHER): Payer: Medicare Other

## 2016-07-08 ENCOUNTER — Other Ambulatory Visit (HOSPITAL_BASED_OUTPATIENT_CLINIC_OR_DEPARTMENT_OTHER): Payer: Medicare Other

## 2016-07-08 VITALS — BP 104/55 | HR 77

## 2016-07-08 VITALS — BP 118/58 | HR 86 | Temp 97.8°F | Resp 17 | Wt 131.0 lb

## 2016-07-08 DIAGNOSIS — D500 Iron deficiency anemia secondary to blood loss (chronic): Secondary | ICD-10-CM | POA: Diagnosis not present

## 2016-07-08 DIAGNOSIS — D508 Other iron deficiency anemias: Secondary | ICD-10-CM

## 2016-07-08 DIAGNOSIS — M810 Age-related osteoporosis without current pathological fracture: Secondary | ICD-10-CM | POA: Insufficient documentation

## 2016-07-08 DIAGNOSIS — D45 Polycythemia vera: Secondary | ICD-10-CM

## 2016-07-08 DIAGNOSIS — M818 Other osteoporosis without current pathological fracture: Secondary | ICD-10-CM | POA: Diagnosis not present

## 2016-07-08 LAB — COMPREHENSIVE METABOLIC PANEL (CC13)
ALT: 16 IU/L (ref 0–32)
AST (SGOT): 25 IU/L (ref 0–40)
Albumin, Serum: 3.7 g/dL (ref 3.5–4.8)
Albumin/Globulin Ratio: 1.2 (ref 1.2–2.2)
Alkaline Phosphatase, S: 90 IU/L (ref 39–117)
BUN/Creatinine Ratio: 27 (ref 12–28)
BUN: 15 mg/dL (ref 8–27)
Bilirubin Total: 0.7 mg/dL (ref 0.0–1.2)
Calcium, Ser: 9.5 mg/dL (ref 8.7–10.3)
Carbon Dioxide, Total: 22 mmol/L (ref 20–29)
Chloride, Ser: 107 mmol/L — ABNORMAL HIGH (ref 96–106)
Creatinine, Ser: 0.55 mg/dL — ABNORMAL LOW (ref 0.57–1.00)
GFR calc Af Amer: 108 mL/min/{1.73_m2} (ref >59)
GFR calc non Af Amer: 94 mL/min/{1.73_m2} (ref >59)
Globulin, Total: 3.1 g/dL (ref 1.5–4.5)
Glucose: 117 mg/dL — ABNORMAL HIGH (ref 65–99)
Potassium, Ser: 4 mmol/L (ref 3.5–5.2)
Sodium: 137 mmol/L (ref 134–144)
Total Protein: 6.8 g/dL (ref 6.0–8.5)

## 2016-07-08 LAB — CBC WITH DIFFERENTIAL (CANCER CENTER ONLY)
BASO#: 0 10*3/uL (ref 0.0–0.2)
BASO%: 0.5 % (ref 0.0–2.0)
EOS%: 1.6 % (ref 0.0–7.0)
Eosinophils Absolute: 0.1 10*3/uL (ref 0.0–0.5)
HCT: 46.9 % — ABNORMAL HIGH (ref 34.8–46.6)
HGB: 16.1 g/dL — ABNORMAL HIGH (ref 11.6–15.9)
LYMPH#: 1 10*3/uL (ref 0.9–3.3)
LYMPH%: 15.6 % (ref 14.0–48.0)
MCH: 34.7 pg — ABNORMAL HIGH (ref 26.0–34.0)
MCHC: 34.3 g/dL (ref 32.0–36.0)
MCV: 101 fL (ref 81–101)
MONO#: 0.7 10*3/uL (ref 0.1–0.9)
MONO%: 10.3 % (ref 0.0–13.0)
NEUT#: 4.5 10*3/uL (ref 1.5–6.5)
NEUT%: 72 % (ref 39.6–80.0)
Platelets: 147 10*3/uL (ref 145–400)
RBC: 4.64 10*6/uL (ref 3.70–5.32)
RDW: 13.2 % (ref 11.1–15.7)
WBC: 6.3 10*3/uL (ref 3.9–10.0)

## 2016-07-08 MED ORDER — VITAMIN D (ERGOCALCIFEROL) 1.25 MG (50000 UNIT) PO CAPS: 50000.0000 [IU] | ORAL_CAPSULE | ORAL | 3 refills | Status: AC

## 2016-07-08 MED ORDER — SODIUM CHLORIDE 0.9 % IV SOLN
INTRAVENOUS | Status: DC
  Administered 2016-07-08: 15:00:00 via INTRAVENOUS

## 2016-07-08 NOTE — Progress Notes (Signed)
[name] presents today for phlebotomy per MD orders. Phlebotomy procedure started at 1421and ended at 1450. 543 grams removed using 20g IV catheter.  Patient observed for 30 minutes after procedure without any incident. Patient tolerated procedure well. IV needle removed intact.

## 2016-07-08 NOTE — Patient Instructions (Signed)
Therapeutic Phlebotomy Therapeutic phlebotomy is the controlled removal of blood from a person's body for the purpose of treating a medical condition. The procedure is similar to donating blood. Usually, about a pint (470 mL, or 0.47L) of blood is removed. The average adult has 9-12 pints (4.3-5.7 L) of blood. Therapeutic phlebotomy may be used to treat the following medical conditions:  Hemochromatosis. This is a condition in which the blood contains too much iron.  Polycythemia vera. This is a condition in which the blood contains too many red blood cells.  Porphyria cutanea tarda. This is a disease in which an important part of hemoglobin is not made properly. It results in the buildup of abnormal amounts of porphyrins in the body.  Sickle cell disease. This is a condition in which the red blood cells form an abnormal crescent shape rather than a round shape.  Tell a health care provider about:  Any allergies you have.  All medicines you are taking, including vitamins, herbs, eye drops, creams, and over-the-counter medicines.  Any problems you or family members have had with anesthetic medicines.  Any blood disorders you have.  Any surgeries you have had.  Any medical conditions you have. What are the risks? Generally, this is a safe procedure. However, problems may occur, including:  Nausea or light-headedness.  Low blood pressure.  Soreness, bleeding, swelling, or bruising at the needle insertion site.  Infection.  What happens before the procedure?  Follow instructions from your health care provider about eating or drinking restrictions.  Ask your health care provider about changing or stopping your regular medicines. This is especially important if you are taking diabetes medicines or blood thinners.  Wear clothing with sleeves that can be raised above the elbow.  Plan to have someone take you home after the procedure.  You may have a blood sample taken. What  happens during the procedure?  A needle will be inserted into one of your veins.  Tubing and a collection bag will be attached to that needle.  Blood will flow through the needle and tubing into the collection bag.  You may be asked to open and close your hand slowly and continually during the entire collection.  After the specified amount of blood has been removed from your body, the collection bag and tubing will be clamped.  The needle will be removed from your vein.  Pressure will be held on the site of the needle insertion to stop the bleeding.  A bandage (dressing) will be placed over the needle insertion site. The procedure may vary among health care providers and hospitals. What happens after the procedure?  Your recovery will be assessed and monitored.  You can return to your normal activities as directed by your health care provider. This information is not intended to replace advice given to you by your health care provider. Make sure you discuss any questions you have with your health care provider. Document Released: 06/16/2010 Document Revised: 09/14/2015 Document Reviewed: 01/08/2014 Elsevier Interactive Patient Education  2018 [employer]  

## 2016-07-08 NOTE — Progress Notes (Signed)
Hematology and Oncology Follow Up Visit  [name] [id number] 08/16/1945 71 y.o. 07/08/2016   Principle Diagnosis:  1. 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  Osteoporosis - with fracture at T11  Current Therapy:   1. Phlebotomy to maintain hematocrit below 45% 2. Aspirin 81 mg p.o. Daily. 3. IV iron as indicated - last received Dec/Jan x 2 4. Zometa IV every 6 months   Interim History: [name] is here today for follow-up with her husband. She is doing better. She's had kyphoplasty of the back. She has had specimen is taken. The pathology report, thankfully, does not show any type of bone marrow issue.  She does wear a back brace. This is helped.  We do phlebotomize her. She probably will need to be phlebotomized today.  We last saw her in early May, her ferritin was 229 with an iron saturation of 39%.  Her breathing is doing okay.  She's had no problems taking vitamin D. She takes vitamin D weekly.  Currently, her performance status is ECOG 2.  Medications:  Allergies as of 07/08/2016      Reactions   Dulera [mometasone Furo-formoterol Fum] Cough      Medication List       Accurate as of 07/08/16  1:47 PM. Always use your most recent med list.          albuterol 108 (90 Base) MCG/ACT inhaler Commonly known as:  PROVENTIL HFA;VENTOLIN HFA Inhale 2 puffs into the lungs every 4 (four) hours as needed for wheezing or shortness of breath.   budesonide-formoterol 160-4.5 MCG/ACT inhaler Commonly known as:  SYMBICORT Inhale 2 puffs into the lungs 2 (two) times daily.   diazepam 10 MG tablet Commonly known as:  VALIUM Take 10 mg by mouth every 6 (six) hours as needed for anxiety.   LOPREEZA 0.5-0.1 MG tablet Generic drug:  Estradiol-Norethindrone Acet Take 1 tablet by mouth daily.   LORazepam 0.5 MG  tablet Commonly known as:  ATIVAN TAKE 1 TABLET BY MOUTH EVERY NIGHT AT BEDTIME   oxycodone-acetaminophen 2.5-325 MG tablet Commonly known as:  PERCOCET Take 1 tablet by mouth every 4 (four) hours as needed for pain.   pantoprazole 40 MG tablet Commonly known as:  PROTONIX Take 40 mg by mouth daily at 8 pm.   sertraline 100 MG tablet Commonly known as:  ZOLOFT Take 100 mg by mouth at bedtime.   Vitamin D (Ergocalciferol) 50000 units Caps capsule Commonly known as:  DRISDOL Take 50,000 Units by mouth every 7 (seven) days. Sundays       Allergies:  Allergies  Allergen Reactions  . Dulera [Mometasone Furo-Formoterol Fum] Cough    Past Medical History, Surgical history, Social history, and Family History were reviewed and updated.  Review of Systems: All other 10 point review of systems is negative.   Physical Exam:  weight is 131 lb (59.4 kg). Her oral temperature is 97.8 F (36.6 C). Her blood pressure is 118/58 (abnormal) and her pulse is 86. Her respiration is 17 and oxygen saturation is 96%.   Wt Readings from Last 3 Encounters:  07/08/16 131 lb (59.4 kg)  06/18/16 130 lb (59 kg)  06/10/16 130 lb (59 kg)    Ocular: Sclerae unicteric, pupils equal, round and reactive to light Ear-nose-throat: Oropharynx clear, dentition fair Lymphatic: No cervical, supraclavicular or axillary adenopathy Lungs no rales or rhonchi, good excursion  bilaterally Heart regular rate and rhythm, no murmur appreciated Abd soft, nontender, positive bowel sounds, no liver or spleen tip palpated on exam, no fluid wave MSK no focal spinal tenderness, no joint edema Neuro: non-focal, well-oriented, appropriate affect Breasts: Deferred   Lab Results  Component Value Date   WBC 6.3 07/08/2016   HGB 16.1 (H) 07/08/2016   HCT 46.9 (H) 07/08/2016   MCV 101 07/08/2016   PLT 147 07/08/2016   Lab Results  Component Value Date   FERRITIN 229 05/27/2016   IRON 103 05/27/2016   TIBC 265  05/27/2016   UIBC 162 05/27/2016   IRONPCTSAT 39 05/27/2016   Lab Results  Component Value Date   RETICCTPCT 1.5 06/12/2014   RBC 4.64 07/08/2016   RETICCTABS 65.9 06/12/2014   Lab Results  Component Value Date   KAPLAMBRATIO 0.91 04/27/2016   Lab Results  Component Value Date   IGGSERUM 897 04/27/2016   IGA 339 02/27/2014   IGMSERUM 132 04/27/2016   Lab Results  Component Value Date   MSPIKE Not Observed 04/27/2016     Chemistry      Component Value Date/Time   NA 139 06/18/2016 0934   NA 137 05/27/2016 1431   NA 138 01/17/2016 1356   K 3.9 06/18/2016 0934   K 4.0 05/27/2016 1431   K 4.1 01/17/2016 1356   CL 106 06/18/2016 0934   CL 105 05/27/2016 1431   CO2 24 06/18/2016 0934   CO2 23 05/27/2016 1431   CO2 25 01/17/2016 1356   BUN 9 06/18/2016 0934   BUN 13 05/27/2016 1431   BUN 12.3 01/17/2016 1356   CREATININE 0.61 06/18/2016 0934   CREATININE 0.7 05/27/2016 1431   CREATININE 0.8 01/17/2016 1356      Component Value Date/Time   CALCIUM 8.5 (L) 06/18/2016 0934   CALCIUM 9.1 05/27/2016 1431   CALCIUM 9.1 01/17/2016 1356   ALKPHOS 117 06/18/2016 0934   ALKPHOS 100 (H) 05/27/2016 1431   ALKPHOS 87 01/17/2016 1356   AST 34 06/18/2016 0934   AST 32 05/27/2016 1431   AST 48 (H) 01/17/2016 1356   ALT 20 06/18/2016 0934   ALT 26 05/27/2016 1431   ALT 38 01/17/2016 1356   BILITOT 1.1 06/18/2016 0934   BILITOT 0.80 05/27/2016 1431   BILITOT 0.67 01/17/2016 1356      Impression and Plan: [name] is a very pleasant 71 yo caucasian female with both hemochromatosis and polycythemia. She also has iron deficiency due to her frequent phlebotomies. She had her T 11 kyphoplasty in this is helped.  She is on Zometa. We give this to her every 6 months. I think she will be due for this in November.  We will plan to get her back to see [place] in another 2 months.   [name], MD 6/13/20181:47 PM

## 2016-07-09 LAB — IRON AND TIBC
%SAT: 55 % (ref 21–57)
Iron: 141 ug/dL (ref 41–142)
TIBC: 254 ug/dL (ref 236–444)
UIBC: 113 ug/dL — ABNORMAL LOW (ref 120–384)

## 2016-07-09 LAB — FERRITIN: Ferritin: 107 ng/ml (ref 9–269)

## 2016-07-22 ENCOUNTER — Ambulatory Visit (INDEPENDENT_AMBULATORY_CARE_PROVIDER_SITE_OTHER): Payer: Medicare Other | Admitting: Emergency Medicine

## 2016-07-22 ENCOUNTER — Encounter: Payer: Self-pay | Admitting: Emergency Medicine

## 2016-07-22 DIAGNOSIS — J449 Chronic obstructive pulmonary disease, unspecified: Secondary | ICD-10-CM

## 2016-07-22 NOTE — Progress Notes (Signed)
Subjective:    Patient ID: [name], female    DOB: [birth year], 71 y.o.   MRN: [id number]  HPI OV 03/20/14 -- Patient comes in today for follow-up of her known chronic obstructive asthma. He has been maintained on Qvar, and at the last visit we tried Spiriva Respimat. Because of expense, she has been using half dose, and most recently has been having issues with increasing shortness of breath. She has been using her rescue inhaler excessively, but is unsure if this helps. She has not wanted to use LABA's because of side effects. He should also be kept in mind that she has some type of neuromuscular disease which appears to be progressing, wraps this is the cause of her dyspnea. She is scheduled to be evaluated at [facility] in the near future.  ROV 08/21/14 -- follow-up visit for patient who has been managed by Dr. Gwenette Greet for severe chronic obstructive disease with an asthmatic component based on a positive bronchodilator response on temporary function testing from 01/23/13 that I have personally reviewed. On that study she also had restricted lung volumes and a decreased diffusion capacity. 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 She was seen at [facility] re: the myopathy, was defined as mild-to-mod on EMG. Is planning to do some PT.  She is using Spiriva respimat 1 puff qd + QVAR bid. Rarely uses albuterol. She describes significant exertional dyspnea, especially with staircase, lifting.  She denies any cough, no mucous production. She underwent a walking oximetry w Dr [name], did not desaturate.   ROV 02/21/15 -- follow-up for severe COPD with an asthmatic component and a positive bronchodilator response. Also some coinciding restrictive lung disease that may be associated with a mitochondrial myopathy. She has been managed on Spiriva, Qvar, pro-air when necessary. She returns today c/o cough that is new compared with last visit. She has a lot of throat  clearing, seems to be exacerbated by Qvar. She is on Spiriva > causes urinary retention. She has some rhinitis, she is having a lot of reflux since last time.   ROV 04/26/15 -- patient is a history of severe COPD with a positive bronchodilator response on spirometry. I last saw her in January 2017. At her last visit we changed her bronchodilators and her QVAR because she has been having side effects. She has had a lot of throat clearing. She developed more dyspnea and went back on QVAR. She has used albuterol with relief but with tremor as a side effect. She tells me that she fell the other day, hit her R flank.   ROV 10/01/15 -- is a follow-up visit for a patient with a history of severe COPD and an asthmatic component based on spirometry. Her FEV1 in 2014 was 0.88 L Consistent with very severe obstruction. Currently managed only on QVAR. She has been evaluated by Dr. Barry Dienes for cholelithiasis and may need to undergo a cholecystectomy. She is having upper abd pain, nausea. She states that her breathing has been bad since our last visit. She cannot do stairs anymore. She takes proair prn, helps her breathing but has the side effect of tremor.   ROV 02/04/16 -- patient has a history of severe obstructive lung disease with an asthmatic component based on a bronchodilator response on her spirometry. She has been managed with Qvar only recently noted to have a formulary changed to Pulmicort.  She uses albuterol rarely mainly because of the significant tremor associated with it.  ROV 07/22/16 -- patient has a history of severe obstructive lung disease with a bronchodilator response on spirometry. We have been managing her with Symbicort since January, using 1 puffs bid instead of 2. She was dealing with severe cough through the Spring. Then she had thoracic compression fractures so her exercise has been minimal. Difficult to gage her exercise tolerance right now. She is concerned about the steroid dose, interested in  decreasing to 80/4.5. Rarely needs albuterol. No flares.    Review of Systems  Constitutional: Negative for fever and unexpected weight change.  HENT: Positive for congestion, postnasal drip and sinus pressure. Negative for dental problem, ear pain, nosebleeds, rhinorrhea, sneezing, sore throat and trouble swallowing.   Eyes: Negative for redness and itching.  Respiratory: Positive for cough, chest tightness and shortness of breath. Negative for wheezing.   Cardiovascular: Negative for palpitations and leg swelling.  Gastrointestinal: Negative for nausea and vomiting.  Genitourinary: Negative for dysuria.  Musculoskeletal: Negative for joint swelling.  Skin: Negative for rash.  Neurological: Negative for headaches.  Hematological: Does not bruise/bleed easily.  Psychiatric/Behavioral: Negative for dysphoric mood. The patient is not nervous/anxious.     Past Medical History:  Diagnosis Date  . Allergy   . Anemia   . Anxiety   . Asthma    DR. BYRUM  . Blood transfusion without reported diagnosis   . Cholelithiasis   . Chronic kidney disease    gallstones   . Compression fracture of body of thoracic vertebra (HCC)   . Depression   . Dyspnea   . Dysrhythmia    palpitations  . GERD (gastroesophageal reflux disease)   . Heart attack ([place])   . Hemorrhoids   . IBS (irritable bowel syndrome)   . Iliotibial band syndrome    left knee  . Migraine   . Mitochondrial myopathy   . Normal coronary arteries    by cardiac catheterization performed by myself 02/25/01  . Osteoporosis   . Pericarditis    age 64  . Pneumothorax   . 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 PVC's (premature ventricular contractions)   . Vitamin D deficiency      Family History  Problem Relation Age of Onset  . Asthma Mother   . Arthritis Mother   . Depression Mother   . Cancer Father   . Emphysema Father   . Stroke Father   . Alcohol abuse Father   . Heart attack Father   . Colon cancer Neg Hx        Social History   Social History  . Marital status: Married    Spouse name: Richard  . Number of children: 2  . Years of education: Grad   Occupational History  . Psychologist, educational Retired   Social History Main Topics  . Smoking status: Never Smoker  . Smokeless tobacco: Never Used     Comment: never used tobacco  . Alcohol use 1.2 oz/week    2 Glasses of wine per week     Comment: occasionally  . Drug use: No  . Sexual activity: Not on file   Other Topics Concern  . Not on file   Social History Narrative   Health Care POA:    Emergency Contact: husband, Francene Finders, (c) [phone]   End of Life Plan:    Who lives with you: husband   Any pets: none   Diet: Pt has a varied diet.  Eats 5 sm. meals throughout day, focuses on protein, doesn't care for  fruits and vegetables very much.   Exercise: Pt has a personal training and exercises several times a week.   Seatbelts: Pt reports wearing seatbelt when in vehicle.   Nancy Fetter Exposure/Protection: Pt reports wearing sun protection.    Hobbies: reading, visiting with friends   Patient has a Scientist, water quality.   Patient has two children.   Patient is retired.   Patient does not drink any caffeine.   Patient is right handed.              Allergies  Allergen Reactions  . Dulera [Mometasone Furo-Formoterol Fum] Cough     Outpatient Medications Prior to Visit  Medication Sig Dispense Refill  . albuterol (PROVENTIL HFA;VENTOLIN HFA) 108 (90 Base) MCG/ACT inhaler Inhale 2 puffs into the lungs every 4 (four) hours as needed for wheezing or shortness of breath. 1 Inhaler 4  . budesonide-formoterol (SYMBICORT) 160-4.5 MCG/ACT inhaler Inhale 2 puffs into the lungs 2 (two) times daily. 1 Inhaler 5  . diazepam (VALIUM) 10 MG tablet Take 10 mg by mouth every 6 (six) hours as needed for anxiety.    . Estradiol-Norethindrone Acet (LOPREEZA) 0.5-0.1 MG per tablet Take 1 tablet by mouth daily.    Marland Kitchen LORazepam (ATIVAN) 0.5 MG tablet TAKE 1  TABLET BY MOUTH EVERY NIGHT AT BEDTIME 30 tablet 0  . pantoprazole (PROTONIX) 40 MG tablet Take 40 mg by mouth daily at 8 pm.    . sertraline (ZOLOFT) 100 MG tablet Take 100 mg by mouth at bedtime.     . Vitamin D, Ergocalciferol, (DRISDOL) 50000 units CAPS capsule Take 1 capsule (50,000 Units total) by mouth every 7 (seven) days. Sundays 30 capsule 3  . oxycodone-acetaminophen (PERCOCET) 2.5-325 MG tablet Take 1 tablet by mouth every 4 (four) hours as needed for pain.     No facility-administered medications prior to visit.          Objective:   Physical Exam Vitals:   07/22/16 1551 07/22/16 1552  BP:  104/64  Pulse:  98  SpO2:  96%  Weight: 130 lb (59 kg)   Height: 5\' 3"  (1.6 m)    Gen: Pleasant, well-nourished, in no distress,  normal affect  ENT: No lesions,  mouth clear,  oropharynx clear, no postnasal drip  Neck: No JVD, no TMG, no carotid bruits  Lungs: No use of accessory muscles, clear without rales or rhonchi  Cardiovascular: RRR, heart sounds normal, no murmur or gallops, no peripheral edema  Musculoskeletal: No deformities, no cyanosis or clubbing  Neuro: alert, non focal  Skin: Warm, no lesions or rashes       Assessment & Plan:  COPD (chronic obstructive pulmonary disease) ([place]) After you have finished with your current Symbicort (160/4.5 g, one puff twice a day) we will change your prescription to the Symbicort 80/4.5 g dose so that you can use 2 puffs twice a day. Keep albuterol available to use as needed for shortness of breath Slowly work on increasing your exercise and stamina Follow with Dr Lamonte Sakai in 3 months or sooner if you have any problems.  Baltazar Apo, MD, PhD 07/22/2016, 4:25 PM [facility] [phone] or if no answer [phone]

## 2016-07-22 NOTE — Patient Instructions (Signed)
After you have finished with your current Symbicort (160/4.5 g, one puff twice a day) we will change your prescription to the Symbicort 80/4.5 g dose so that you can use 2 puffs twice a day. Keep albuterol available to use as needed for shortness of breath Slowly work on increasing your exercise and stamina Follow with Dr Lamonte Sakai in 3 months or sooner if you have any problems.

## 2016-07-22 NOTE — Assessment & Plan Note (Signed)
After you have finished with your current Symbicort (160/4.5 g, one puff twice a day) we will change your prescription to the Symbicort 80/4.5 g dose so that you can use 2 puffs twice a day. Keep albuterol available to use as needed for shortness of breath Slowly work on increasing your exercise and stamina Follow with Dr Lamonte Sakai in 3 months or sooner if you have any problems.

## 2016-08-12 DIAGNOSIS — H26492 Other secondary cataract, left eye: Secondary | ICD-10-CM | POA: Diagnosis not present

## 2016-08-12 DIAGNOSIS — Z961 Presence of intraocular lens: Secondary | ICD-10-CM | POA: Diagnosis not present

## 2016-08-12 DIAGNOSIS — Z9842 Cataract extraction status, left eye: Secondary | ICD-10-CM | POA: Diagnosis not present

## 2016-08-12 DIAGNOSIS — Z9841 Cataract extraction status, right eye: Secondary | ICD-10-CM | POA: Diagnosis not present

## 2016-08-24 DIAGNOSIS — Z1231 Encounter for screening mammogram for malignant neoplasm of breast: Secondary | ICD-10-CM | POA: Diagnosis not present

## 2016-08-28 DIAGNOSIS — H26491 Other secondary cataract, right eye: Secondary | ICD-10-CM | POA: Diagnosis not present

## 2016-09-07 ENCOUNTER — Ambulatory Visit (HOSPITAL_BASED_OUTPATIENT_CLINIC_OR_DEPARTMENT_OTHER): Payer: Medicare Other | Admitting: Hematology & Oncology

## 2016-09-07 ENCOUNTER — Other Ambulatory Visit (HOSPITAL_BASED_OUTPATIENT_CLINIC_OR_DEPARTMENT_OTHER): Payer: Medicare Other

## 2016-09-07 VITALS — BP 114/61 | HR 80 | Temp 98.0°F | Resp 16 | Wt 133.0 lb

## 2016-09-07 DIAGNOSIS — M810 Age-related osteoporosis without current pathological fracture: Secondary | ICD-10-CM

## 2016-09-07 DIAGNOSIS — D45 Polycythemia vera: Secondary | ICD-10-CM | POA: Diagnosis not present

## 2016-09-07 LAB — CBC WITH DIFFERENTIAL (CANCER CENTER ONLY)
BASO#: 0 10*3/uL (ref 0.0–0.2)
BASO%: 0.7 % (ref 0.0–2.0)
EOS%: 1.3 % (ref 0.0–7.0)
Eosinophils Absolute: 0.1 10*3/uL (ref 0.0–0.5)
HCT: 43.5 % (ref 34.8–46.6)
HGB: 15.2 g/dL (ref 11.6–15.9)
LYMPH#: 1 10*3/uL (ref 0.9–3.3)
LYMPH%: 15.9 % (ref 14.0–48.0)
MCH: 35.8 pg — ABNORMAL HIGH (ref 26.0–34.0)
MCHC: 34.9 g/dL (ref 32.0–36.0)
MCV: 102 fL — ABNORMAL HIGH (ref 81–101)
MONO#: 0.7 10*3/uL (ref 0.1–0.9)
MONO%: 11.9 % (ref 0.0–13.0)
NEUT#: 4.2 10*3/uL (ref 1.5–6.5)
NEUT%: 70.2 % (ref 39.6–80.0)
Platelets: 144 10*3/uL — ABNORMAL LOW (ref 145–400)
RBC: 4.25 10*6/uL (ref 3.70–5.32)
RDW: 13 % (ref 11.1–15.7)
WBC: 6 10*3/uL (ref 3.9–10.0)

## 2016-09-07 LAB — IRON AND TIBC
%SAT: 54 % (ref 21–57)
Iron: 152 ug/dL — ABNORMAL HIGH (ref 41–142)
TIBC: 281 ug/dL (ref 236–444)
UIBC: 129 ug/dL (ref 120–384)

## 2016-09-07 LAB — CMP (CANCER CENTER ONLY)
ALT(SGPT): 26 U/L (ref 10–47)
AST: 42 U/L — ABNORMAL HIGH (ref 11–38)
Albumin: 3.4 g/dL (ref 3.3–5.5)
Alkaline Phosphatase: 69 U/L (ref 26–84)
BUN, Bld: 12 mg/dL (ref 7–22)
CO2: 29 mEq/L (ref 18–33)
Calcium: 8.6 mg/dL (ref 8.0–10.3)
Chloride: 103 mEq/L (ref 98–108)
Creat: 0.6 mg/dl (ref 0.6–1.2)
Glucose, Bld: 90 mg/dL (ref 73–118)
Potassium: 4.2 mEq/L (ref 3.3–4.7)
Sodium: 139 mEq/L (ref 128–145)
Total Bilirubin: 1.4 mg/dl (ref 0.20–1.60)
Total Protein: 7 g/dL (ref 6.4–8.1)

## 2016-09-07 LAB — FERRITIN: Ferritin: 96 ng/ml (ref 9–269)

## 2016-09-07 NOTE — Progress Notes (Signed)
Hematology and Oncology Follow Up Visit  [name] [id number] 01-07-1946 71 y.o. 09/07/2016   Principle Diagnosis:  1. 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  Osteoporosis - with fracture at T11  Current Therapy:   1. Phlebotomy to maintain hematocrit below 45% 2. Aspirin 81 mg p.o. Daily. 3. IV iron as indicated - last received Dec/Jan x 2 4. Zometa IV every 6 months - next dose in November 2018   Interim History: [name] is here today for follow-up with [name]. She looks better. She feels a little bit better. Faith back is not hurting nearly as much  which is really nice to see.  She still has Faith lung issues. She sees [name]. He is really doing a great job with Faith.  She does have a hard time getting around. She has this mitochondrial myopathy. He does make it more difficult for Faith to exercise.  We'll last saw Faith in June, Faith ferritin was 107 with iron saturation of 55%.  nce status is ECOG 2.  Medications:  Allergies as of 09/07/2016      Reactions   Dulera [mometasone Furo-formoterol Fum] Cough      Medication List       Accurate as of 09/07/16 12:54 PM. Always use your most recent med list.          albuterol 108 (90 Base) MCG/ACT inhaler Commonly known as:  PROVENTIL HFA;VENTOLIN HFA Inhale 2 puffs into the lungs every 4 (four) hours as needed for wheezing or shortness of breath.   aspirin 81 MG chewable tablet Chew by mouth.   budesonide-formoterol 160-4.5 MCG/ACT inhaler Commonly known as:  SYMBICORT Inhale 2 puffs into the lungs 2 (two) times daily.   diazepam 10 MG tablet Commonly known as:  VALIUM Take 10 mg by mouth every 6 (six) hours as needed for anxiety.   LOPREEZA 0.5-0.1 MG tablet Generic drug:  Estradiol-Norethindrone Acet Take 1 tablet by mouth daily.   LORazepam 0.5 MG  tablet Commonly known as:  ATIVAN TAKE 1 TABLET BY MOUTH EVERY NIGHT AT BEDTIME   MOVIPREP 100 g Solr Generic drug:  peg 3350 powder MoviPrep 100 gram-7.5 gram-2.691 gram oral powder packet   pantoprazole 40 MG tablet Commonly known as:  PROTONIX Take 40 mg by mouth daily at 8 pm.   progesterone 100 MG capsule Commonly known as:  PROMETRIUM   sertraline 100 MG tablet Commonly known as:  ZOLOFT Take 100 mg by mouth at bedtime.   Vitamin D (Ergocalciferol) 50000 units Caps capsule Commonly known as:  DRISDOL Take 1 capsule (50,000 Units total) by mouth every 7 (seven) days. Sundays   VIVELLE-DOT 0.0375 MG/24HR Generic drug:  estradiol       Allergies:  Allergies  Allergen Reactions  . Dulera [Mometasone Furo-Formoterol Fum] Cough    Past Medical History, Surgical history, Social history, and Family History were reviewed and updated.  Review of Systems: All other 10 point review of systems is negative.   Physical Exam:  weight is 133 lb (60.3 kg). Faith oral temperature is 98 F (36.7 C). Faith blood pressure is 114/61 and Faith pulse is 80. Faith respiration is 16 and oxygen saturation is 98%.   Wt Readings from Last 3 Encounters:  09/07/16 133 lb (60.3 kg)  07/22/16 130 lb (59 kg)  07/08/16 131 lb (59.4 kg)    Petite white female  in no obvious distress. Head and neck exam shows no ocular or oral lesions. She has no scleral icterus. She has no adenopathy in the neck. Lungs are clear bilaterally. She has good air movement bilaterally. Cardiac exam regular rate and rhythm with no murmurs, rubs or bruits. Abdomen is soft. She has good bowel sounds. There is no fluid wave. There is no palpable liver or spleen tip. Back exam does show some tenderness to palpation in the lumbar spine. She may some slight paravertebral muscle spasm in the lower back. Extremities shows no clubbing, cyanosis or edema. She has a constant resting tremor. Skin exam shows no rashes, ecchymoses or petechia.     Lab Results  Component Value Date   WBC 6.0 09/07/2016   HGB 15.2 09/07/2016   HCT 43.5 09/07/2016   MCV 102 (H) 09/07/2016   PLT 144 (L) 09/07/2016   Lab Results  Component Value Date   FERRITIN 107 07/08/2016   IRON 141 07/08/2016   TIBC 254 07/08/2016   UIBC 113 (L) 07/08/2016   IRONPCTSAT 55 07/08/2016   Lab Results  Component Value Date   RETICCTPCT 1.5 06/12/2014   RBC 4.25 09/07/2016   RETICCTABS 65.9 06/12/2014   Lab Results  Component Value Date   KAPLAMBRATIO 0.91 04/27/2016   Lab Results  Component Value Date   IGGSERUM 897 04/27/2016   IGA 339 02/27/2014   IGMSERUM 132 04/27/2016   Lab Results  Component Value Date   MSPIKE Not Observed 04/27/2016     Chemistry      Component Value Date/Time   NA 139 09/07/2016 1150   NA 138 01/17/2016 1356   K 4.2 09/07/2016 1150   K 4.1 01/17/2016 1356   CL 103 09/07/2016 1150   CO2 29 09/07/2016 1150   CO2 25 01/17/2016 1356   BUN 12 09/07/2016 1150   BUN 12.3 01/17/2016 1356   CREATININE 0.6 09/07/2016 1150   CREATININE 0.8 01/17/2016 1356      Component Value Date/Time   CALCIUM 8.6 09/07/2016 1150   CALCIUM 9.1 01/17/2016 1356   ALKPHOS 69 09/07/2016 1150   ALKPHOS 87 01/17/2016 1356   AST 42 (H) 09/07/2016 1150   AST 48 (H) 01/17/2016 1356   ALT 26 09/07/2016 1150   ALT 38 01/17/2016 1356   BILITOT 1.40 09/07/2016 1150   BILITOT 0.67 01/17/2016 1356      Impression and Plan: [name] is a very pleasant 71 yo caucasian female with both hemochromatosis and polycythemia. She also has iron deficiency due to Faith frequent phlebotomies. She had T11 kyphoplasty in this is helped.  She is on Zometa. We give this to Faith every 6 months. She will be due for this in November.  She does not need to be phlebotomize which is wonderful.  I know that she does have hemochromatosis. However, I don't think we have to really get too aggressive with this.  We will plan to get Faith back to see [place] in another 6  weeks   [name], MD 8/13/201812:54 PM

## 2016-09-08 LAB — VITAMIN D 25 HYDROXY (VIT D DEFICIENCY, FRACTURES): Vitamin D, 25-Hydroxy: 41.4 ng/mL (ref 30.0–100.0)

## 2016-09-11 ENCOUNTER — Encounter: Payer: Self-pay | Admitting: Hematology & Oncology

## 2016-09-15 ENCOUNTER — Telehealth: Payer: Self-pay | Admitting: Emergency Medicine

## 2016-09-15 MED ORDER — BUDESONIDE-FORMOTEROL FUMARATE 80-4.5 MCG/ACT IN AERO: 2.0000 | INHALATION_SPRAY | Freq: Two times a day (BID) | RESPIRATORY_TRACT | 12 refills | Status: DC

## 2016-09-15 NOTE — Telephone Encounter (Signed)
Rx for symbicort 80 has been sent to preferred pharmacy. Per last OV note on 07/22/16, pt was instructed to finish current dose of symbicort 160 then start symbicort 80. Pt aware and voiced her understanding. Nothing further needed.

## 2016-09-16 DIAGNOSIS — L821 Other seborrheic keratosis: Secondary | ICD-10-CM | POA: Diagnosis not present

## 2016-09-16 DIAGNOSIS — D485 Neoplasm of uncertain behavior of skin: Secondary | ICD-10-CM | POA: Diagnosis not present

## 2016-09-16 DIAGNOSIS — L720 Epidermal cyst: Secondary | ICD-10-CM | POA: Diagnosis not present

## 2016-09-16 DIAGNOSIS — L820 Inflamed seborrheic keratosis: Secondary | ICD-10-CM | POA: Diagnosis not present

## 2016-09-16 DIAGNOSIS — D692 Other nonthrombocytopenic purpura: Secondary | ICD-10-CM | POA: Diagnosis not present

## 2016-09-29 DIAGNOSIS — L0889 Other specified local infections of the skin and subcutaneous tissue: Secondary | ICD-10-CM | POA: Diagnosis not present

## 2016-09-29 DIAGNOSIS — L720 Epidermal cyst: Secondary | ICD-10-CM | POA: Diagnosis not present

## 2016-09-29 DIAGNOSIS — K122 Cellulitis and abscess of mouth: Secondary | ICD-10-CM | POA: Diagnosis not present

## 2016-10-20 ENCOUNTER — Other Ambulatory Visit (HOSPITAL_BASED_OUTPATIENT_CLINIC_OR_DEPARTMENT_OTHER): Payer: Medicare Other

## 2016-10-20 ENCOUNTER — Ambulatory Visit (HOSPITAL_BASED_OUTPATIENT_CLINIC_OR_DEPARTMENT_OTHER): Payer: Medicare Other | Admitting: Family

## 2016-10-20 VITALS — BP 130/59 | HR 80 | Temp 97.8°F | Resp 18 | Wt 135.0 lb

## 2016-10-20 DIAGNOSIS — Z23 Encounter for immunization: Secondary | ICD-10-CM | POA: Diagnosis not present

## 2016-10-20 DIAGNOSIS — D500 Iron deficiency anemia secondary to blood loss (chronic): Secondary | ICD-10-CM

## 2016-10-20 DIAGNOSIS — D508 Other iron deficiency anemias: Secondary | ICD-10-CM | POA: Diagnosis not present

## 2016-10-20 DIAGNOSIS — D45 Polycythemia vera: Secondary | ICD-10-CM | POA: Diagnosis not present

## 2016-10-20 LAB — CMP (CANCER CENTER ONLY)
ALT(SGPT): 33 U/L (ref 10–47)
AST: 52 U/L — ABNORMAL HIGH (ref 11–38)
Albumin: 3.2 g/dL — ABNORMAL LOW (ref 3.3–5.5)
Alkaline Phosphatase: 63 U/L (ref 26–84)
BUN, Bld: 16 mg/dL (ref 7–22)
CO2: 25 mEq/L (ref 18–33)
Calcium: 8.8 mg/dL (ref 8.0–10.3)
Chloride: 104 mEq/L (ref 98–108)
Creat: 0.9 mg/dl (ref 0.6–1.2)
Glucose, Bld: 117 mg/dL (ref 73–118)
Potassium: 4 mEq/L (ref 3.3–4.7)
Sodium: 138 mEq/L (ref 128–145)
Total Bilirubin: 1.4 mg/dl (ref 0.20–1.60)
Total Protein: 6.8 g/dL (ref 6.4–8.1)

## 2016-10-20 LAB — CBC WITH DIFFERENTIAL (CANCER CENTER ONLY)
BASO#: 0 10*3/uL (ref 0.0–0.2)
BASO%: 0.5 % (ref 0.0–2.0)
EOS%: 1.4 % (ref 0.0–7.0)
Eosinophils Absolute: 0.1 10*3/uL (ref 0.0–0.5)
HCT: 45.1 % (ref 34.8–46.6)
HGB: 15.7 g/dL (ref 11.6–15.9)
LYMPH#: 0.9 10*3/uL (ref 0.9–3.3)
LYMPH%: 15 % (ref 14.0–48.0)
MCH: 36.3 pg — ABNORMAL HIGH (ref 26.0–34.0)
MCHC: 34.8 g/dL (ref 32.0–36.0)
MCV: 104 fL — ABNORMAL HIGH (ref 81–101)
MONO#: 0.7 10*3/uL (ref 0.1–0.9)
MONO%: 11.3 % (ref 0.0–13.0)
NEUT#: 4.5 10*3/uL (ref 1.5–6.5)
NEUT%: 71.8 % (ref 39.6–80.0)
Platelets: 140 10*3/uL — ABNORMAL LOW (ref 145–400)
RBC: 4.32 10*6/uL (ref 3.70–5.32)
RDW: 12.4 % (ref 11.1–15.7)
WBC: 6.3 10*3/uL (ref 3.9–10.0)

## 2016-10-20 LAB — LACTATE DEHYDROGENASE: LDH: 187 U/L (ref 125–245)

## 2016-10-20 NOTE — Progress Notes (Signed)
Hematology and Oncology Follow Up Visit  [name] [id number] 1945-02-04 71 y.o. 10/20/2016   Principle Diagnosis:  1. 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  Osteoporosis - with fracture at T11  Current Therapy:   1. Phlebotomy to maintain hematocrit below 45% 2. Aspirin 81 mg p.o. Daily. 3. IV iron as indicated - last received Dec/Jan x 2 4. Zometa IV every 6 months - next dose in November 2018   Interim History:  [name] is here today with her husband for follow-up. She is doing well and states that she is feeling better but still has some fatigue at times. Her Hct today is 45.1%. She does not feel that she needs a phelbotomy at this time.  No fever, chills, n/v, rash, dizziness, headache, vision changes, chest pain, palpitations, abdominal pain or changes in bowel or bladder habits.  Her SOB with over exertion is unchanged. She continues to be followed by pulmonary Dr. Lamonte Sakai.  No swelling, tenderness, numbness or tingling in her extremities.  She still has some lower back pain but this is much improved. She is getting around nicely today. No falls or syncopal episodes.  She has a good appetite and is staying well hydrated. Her weight is stable.   ECOG Performance Status: 1 - Symptomatic but completely ambulatory  Medications:  Allergies as of 10/20/2016      Reactions   Dulera [mometasone Furo-formoterol Fum] Cough      Medication List       Accurate as of 10/20/16  1:36 PM. Always use your most recent med list.          albuterol 108 (90 Base) MCG/ACT inhaler Commonly known as:  PROVENTIL HFA;VENTOLIN HFA Inhale 2 puffs into the lungs every 4 (four) hours as needed for wheezing or shortness of breath.   aspirin 81 MG chewable tablet Chew by mouth.   budesonide-formoterol 160-4.5 MCG/ACT inhaler Commonly known  as:  SYMBICORT Inhale 2 puffs into the lungs 2 (two) times daily.   budesonide-formoterol 80-4.5 MCG/ACT inhaler Commonly known as:  SYMBICORT Inhale 2 puffs into the lungs 2 (two) times daily.   diazepam 10 MG tablet Commonly known as:  VALIUM Take 10 mg by mouth every 6 (six) hours as needed for anxiety.   LOPREEZA 0.5-0.1 MG tablet Generic drug:  Estradiol-Norethindrone Acet Take 1 tablet by mouth daily.   LORazepam 0.5 MG tablet Commonly known as:  ATIVAN TAKE 1 TABLET BY MOUTH EVERY NIGHT AT BEDTIME   MOVIPREP 100 g Solr Generic drug:  peg 3350 powder MoviPrep 100 gram-7.5 gram-2.691 gram oral powder packet   pantoprazole 40 MG tablet Commonly known as:  PROTONIX Take 40 mg by mouth daily at 8 pm.   progesterone 100 MG capsule Commonly known as:  PROMETRIUM   sertraline 100 MG tablet Commonly known as:  ZOLOFT Take 100 mg by mouth at bedtime.   Vitamin D (Ergocalciferol) 50000 units Caps capsule Commonly known as:  DRISDOL Take 1 capsule (50,000 Units total) by mouth every 7 (seven) days. Sundays   VIVELLE-DOT 0.0375 MG/24HR Generic drug:  estradiol       Allergies:  Allergies  Allergen Reactions  . Dulera [Mometasone Furo-Formoterol Fum] Cough    Past Medical History, Surgical history, Social history, and Family History were reviewed and updated.  Review of Systems: All other 10 point review of systems is negative.   Physical  Exam:  vitals were not taken for this visit.  Wt Readings from Last 3 Encounters:  09/07/16 133 lb (60.3 kg)  07/22/16 130 lb (59 kg)  07/08/16 131 lb (59.4 kg)    Ocular: Sclerae unicteric, pupils equal, round and reactive to light Ear-nose-throat: Oropharynx clear, dentition fair Lymphatic: No cervical, supraclavicular or axillary adenopathy Lungs no rales or rhonchi, good excursion bilaterally Heart regular rate and rhythm, no murmur appreciated Abd soft, nontender, positive bowel sounds, no liver or spleen tip  palpated on exam, no fluid wave  MSK no focal spinal tenderness, no joint edema Neuro: non-focal, well-oriented, appropriate affect Breasts: Deferred   Lab Results  Component Value Date   WBC 6.3 10/20/2016   HGB 15.7 10/20/2016   HCT 45.1 10/20/2016   MCV 104 (H) 10/20/2016   PLT 140 (L) 10/20/2016   Lab Results  Component Value Date   FERRITIN 96 09/07/2016   IRON 152 (H) 09/07/2016   TIBC 281 09/07/2016   UIBC 129 09/07/2016   IRONPCTSAT 54 09/07/2016   Lab Results  Component Value Date   RETICCTPCT 1.5 06/12/2014   RBC 4.32 10/20/2016   RETICCTABS 65.9 06/12/2014   Lab Results  Component Value Date   KAPLAMBRATIO 0.91 04/27/2016   Lab Results  Component Value Date   IGGSERUM 897 04/27/2016   IGA 339 02/27/2014   IGMSERUM 132 04/27/2016   Lab Results  Component Value Date   MSPIKE Not Observed 04/27/2016     Chemistry      Component Value Date/Time   NA 139 09/07/2016 1150   NA 138 01/17/2016 1356   K 4.2 09/07/2016 1150   K 4.1 01/17/2016 1356   CL 103 09/07/2016 1150   CO2 29 09/07/2016 1150   CO2 25 01/17/2016 1356   BUN 12 09/07/2016 1150   BUN 12.3 01/17/2016 1356   CREATININE 0.6 09/07/2016 1150   CREATININE 0.8 01/17/2016 1356      Component Value Date/Time   CALCIUM 8.6 09/07/2016 1150   CALCIUM 9.1 01/17/2016 1356   ALKPHOS 69 09/07/2016 1150   ALKPHOS 87 01/17/2016 1356   AST 42 (H) 09/07/2016 1150   AST 48 (H) 01/17/2016 1356   ALT 26 09/07/2016 1150   ALT 38 01/17/2016 1356   BILITOT 1.40 09/07/2016 1150   BILITOT 0.67 01/17/2016 1356      Impression and Plan: [name] is a very pleasant 71 yo caucasian female with both hemochromatosis and polycythemia. Hct today is 45.1 and she does not want a phlebotomy today. She has also developed iron deficiency anemia with phlebotomy.  We will hold off for now and plan to see her back again in another 6 weeks.  We will see what her iron studies show and bring her back in later this week  for an infusion if needed.  She will contact our office with any questions or concerns. We can certainly see her sooner if need be.   Eliezer Bottom, NP 9/25/20181:36 PM

## 2016-10-21 LAB — IRON AND TIBC
%SAT: 55 % (ref 21–57)
Iron: 151 ug/dL — ABNORMAL HIGH (ref 41–142)
TIBC: 273 ug/dL (ref 236–444)
UIBC: 122 ug/dL (ref 120–384)

## 2016-10-21 LAB — FERRITIN: Ferritin: 95 ng/ml (ref 9–269)

## 2016-10-27 ENCOUNTER — Ambulatory Visit: Payer: Medicare Other | Admitting: Family

## 2016-10-27 ENCOUNTER — Other Ambulatory Visit: Payer: Medicare Other

## 2016-10-28 ENCOUNTER — Ambulatory Visit: Payer: Medicare Other | Admitting: Emergency Medicine

## 2016-10-30 ENCOUNTER — Encounter: Payer: Self-pay | Admitting: Gastroenterology

## 2016-11-25 DIAGNOSIS — Z01419 Encounter for gynecological examination (general) (routine) without abnormal findings: Secondary | ICD-10-CM | POA: Diagnosis not present

## 2016-11-25 DIAGNOSIS — Z79890 Hormone replacement therapy (postmenopausal): Secondary | ICD-10-CM | POA: Diagnosis not present

## 2016-11-25 DIAGNOSIS — N959 Unspecified menopausal and perimenopausal disorder: Secondary | ICD-10-CM | POA: Diagnosis not present

## 2016-12-01 ENCOUNTER — Ambulatory Visit (HOSPITAL_BASED_OUTPATIENT_CLINIC_OR_DEPARTMENT_OTHER): Payer: Medicare Other

## 2016-12-01 ENCOUNTER — Ambulatory Visit (HOSPITAL_BASED_OUTPATIENT_CLINIC_OR_DEPARTMENT_OTHER): Payer: Medicare Other | Admitting: Family

## 2016-12-01 ENCOUNTER — Encounter: Payer: Self-pay | Admitting: Family

## 2016-12-01 ENCOUNTER — Other Ambulatory Visit (HOSPITAL_BASED_OUTPATIENT_CLINIC_OR_DEPARTMENT_OTHER): Payer: Medicare Other

## 2016-12-01 VITALS — BP 127/60 | HR 85 | Temp 97.6°F | Resp 17 | Wt 138.4 lb

## 2016-12-01 DIAGNOSIS — D45 Polycythemia vera: Secondary | ICD-10-CM

## 2016-12-01 DIAGNOSIS — D500 Iron deficiency anemia secondary to blood loss (chronic): Secondary | ICD-10-CM

## 2016-12-01 DIAGNOSIS — M8000XA Age-related osteoporosis with current pathological fracture, unspecified site, initial encounter for fracture: Secondary | ICD-10-CM | POA: Diagnosis present

## 2016-12-01 DIAGNOSIS — D508 Other iron deficiency anemias: Secondary | ICD-10-CM

## 2016-12-01 DIAGNOSIS — D510 Vitamin B12 deficiency anemia due to intrinsic factor deficiency: Secondary | ICD-10-CM

## 2016-12-01 DIAGNOSIS — G713 Mitochondrial myopathy, not elsewhere classified: Secondary | ICD-10-CM

## 2016-12-01 LAB — CBC WITH DIFFERENTIAL (CANCER CENTER ONLY)
BASO#: 0 10*3/uL (ref 0.0–0.2)
BASO%: 0.6 % (ref 0.0–2.0)
EOS%: 1.3 % (ref 0.0–7.0)
Eosinophils Absolute: 0.1 10*3/uL (ref 0.0–0.5)
HCT: 45.5 % (ref 34.8–46.6)
HGB: 15.9 g/dL (ref 11.6–15.9)
LYMPH#: 1.2 10*3/uL (ref 0.9–3.3)
LYMPH%: 17 % (ref 14.0–48.0)
MCH: 36.1 pg — ABNORMAL HIGH (ref 26.0–34.0)
MCHC: 34.9 g/dL (ref 32.0–36.0)
MCV: 103 fL — ABNORMAL HIGH (ref 81–101)
MONO#: 0.8 10*3/uL (ref 0.1–0.9)
MONO%: 11.7 % (ref 0.0–13.0)
NEUT#: 4.9 10*3/uL (ref 1.5–6.5)
NEUT%: 69.4 % (ref 39.6–80.0)
Platelets: 159 10*3/uL (ref 145–400)
RBC: 4.4 10*6/uL (ref 3.70–5.32)
RDW: 11.9 % (ref 11.1–15.7)
WBC: 7 10*3/uL (ref 3.9–10.0)

## 2016-12-01 LAB — CMP (CANCER CENTER ONLY)
ALT(SGPT): 46 U/L (ref 10–47)
AST: 60 U/L — ABNORMAL HIGH (ref 11–38)
Albumin: 3.6 g/dL (ref 3.3–5.5)
Alkaline Phosphatase: 70 U/L (ref 26–84)
BUN, Bld: 16 mg/dL (ref 7–22)
CO2: 27 mEq/L (ref 18–33)
Calcium: 9.1 mg/dL (ref 8.0–10.3)
Chloride: 101 mEq/L (ref 98–108)
Creat: 0.7 mg/dl (ref 0.6–1.2)
Glucose, Bld: 108 mg/dL (ref 73–118)
Potassium: 3.9 mEq/L (ref 3.3–4.7)
Sodium: 140 mEq/L (ref 128–145)
Total Bilirubin: 1.4 mg/dl (ref 0.20–1.60)
Total Protein: 7.3 g/dL (ref 6.4–8.1)

## 2016-12-01 LAB — LACTATE DEHYDROGENASE: LDH: 203 U/L (ref 125–245)

## 2016-12-01 MED ORDER — ZOLEDRONIC ACID 4 MG/100ML IV SOLN
4.0000 mg | Freq: Once | INTRAVENOUS | Status: AC
  Administered 2016-12-01: 4 mg via INTRAVENOUS
  Filled 2016-12-01: qty 100

## 2016-12-01 MED ORDER — SODIUM CHLORIDE 0.9 % IV SOLN
Freq: Once | INTRAVENOUS | Status: AC
  Administered 2016-12-01: 16:00:00 via INTRAVENOUS

## 2016-12-01 NOTE — Patient Instructions (Signed)

## 2016-12-01 NOTE — Progress Notes (Signed)
Hematology and Oncology Follow Up Visit  [name] [id number] November 14, 1945 71 y.o. 12/01/2016   Principle Diagnosis:  1. 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 Osteoporosis - with fracture at T11  Current Therapy:   1. Phlebotomy to maintain hematocrit below 45% 2. Aspirin 81 mg p.o. Daily. 3. IV iron as indicated - last received Dec/Jan x 2 4. Zometa IV every 6 months - next dose due in February 2019   Interim History:  [name] is here today for follow-up. She is feeling fatigue which she attributes to her muscle weakness. Hct today is 45.5. WE discussed doing a phlebotomy and she would prefer to hold off for now.  Her iron saturation in September was 55% and ferritin was 95. Iron studies for today are pending. She verbalized that she is taking her baby aspirin daily as prescribed.  No fever, chills, n/v, cough, rash, dizziness, chest pain, palpitations or changes in bladder or bowel habits.  Her SOB with exertion is better. She states that a recent medication change has helped quite a bit.  She has a visible tremor of the arms and legs. She states that she has seen two neurologists and had multiple work ups and biopsies with no findings.  She has been wringing her hands and is not sure if this is nerve related or part of her tremor. We discussed her going to see Dr. Carles Collet but she states that she is ok for now. She did mention that her parents were first cousins and she feels that this likely has influenced her neurological issues.  She states that she is due for her colonoscopy and will be scheduling this for next summer.  Her back pain is better and she is ambulating with only a cane today. No swelling, tenderness, numbness or tingling in her extremities.  She has maintained a good appetite and is staying well hydrated. Her weight is  stable.   ECOG Performance Status: 1 - Symptomatic but completely ambulatory  Medications:  Allergies as of 12/01/2016      Reactions   Dulera [mometasone Furo-formoterol Fum] Cough      Medication List        Accurate as of 12/01/16  2:29 PM. Always use your most recent med list.          albuterol 108 (90 Base) MCG/ACT inhaler Commonly known as:  PROVENTIL HFA;VENTOLIN HFA Inhale 2 puffs into the lungs every 4 (four) hours as needed for wheezing or shortness of breath.   aspirin 81 MG chewable tablet Chew by mouth.   diazepam 10 MG tablet Commonly known as:  VALIUM Take 10 mg by mouth every 6 (six) hours as needed for anxiety.   LOPREEZA 0.5-0.1 MG tablet Generic drug:  Estradiol-Norethindrone Acet Take 1 tablet by mouth daily.   LORazepam 0.5 MG tablet Commonly known as:  ATIVAN TAKE 1 TABLET BY MOUTH EVERY NIGHT AT BEDTIME   pantoprazole 40 MG tablet Commonly known as:  PROTONIX take 1 tablet by mouth twice a day before meals   SYMBICORT 80-4.5 MCG/ACT inhaler Generic drug:  budesonide-formoterol Inhale 2 puffs into the lungs 2 (two) times daily.   Vitamin D (Ergocalciferol) 50000 units Caps capsule Commonly known as:  DRISDOL Take 1 capsule (50,000 Units total) by mouth every 7 (seven) days. Sundays       Allergies:  Allergies  Allergen Reactions  . Ruthe Mannan [  Mometasone Furo-Formoterol Fum] Cough    Past Medical History, Surgical history, Social history, and Family History were reviewed and updated.  Review of Systems: All other 10 point review of systems is negative.   Physical Exam:  weight is 138 lb 6.4 oz (62.8 kg). Her oral temperature is 97.6 F (36.4 C). Her blood pressure is 127/60 and her pulse is 85. Her respiration is 17 and oxygen saturation is 98%.   Wt Readings from Last 3 Encounters:  12/01/16 138 lb 6.4 oz (62.8 kg)  10/20/16 135 lb (61.2 kg)  09/07/16 133 lb (60.3 kg)    Ocular: Sclerae unicteric, pupils equal, round and  reactive to light Ear-nose-throat: Oropharynx clear, dentition fair Lymphatic: No cervical, supraclavicular or axillary adenopathy Lungs no rales or rhonchi, good excursion bilaterally Heart regular rate and rhythm, no murmur appreciated Abd soft, nontender, positive bowel sounds, no liver or spleen tip palpated on exam, no fluid wave  MSK no focal spinal tenderness, no joint edema Neuro: non-focal, well-oriented, appropriate affect Breasts: Deferred  Lab Results  Component Value Date   WBC 7.0 12/01/2016   HGB 15.9 12/01/2016   HCT 45.5 12/01/2016   MCV 103 (H) 12/01/2016   PLT 159 12/01/2016   Lab Results  Component Value Date   FERRITIN 95 10/20/2016   IRON 151 (H) 10/20/2016   TIBC 273 10/20/2016   UIBC 122 10/20/2016   IRONPCTSAT 55 10/20/2016   Lab Results  Component Value Date   RETICCTPCT 1.5 06/12/2014   RBC 4.40 12/01/2016   RETICCTABS 65.9 06/12/2014   Lab Results  Component Value Date   KAPLAMBRATIO 0.91 04/27/2016   Lab Results  Component Value Date   IGGSERUM 897 04/27/2016   IGA 339 02/27/2014   IGMSERUM 132 04/27/2016   Lab Results  Component Value Date   MSPIKE Not Observed 04/27/2016     Chemistry      Component Value Date/Time   NA 138 10/20/2016 1320   NA 138 01/17/2016 1356   K 4.0 10/20/2016 1320   K 4.1 01/17/2016 1356   CL 104 10/20/2016 1320   CO2 25 10/20/2016 1320   CO2 25 01/17/2016 1356   BUN 16 10/20/2016 1320   BUN 12.3 01/17/2016 1356   CREATININE 0.9 10/20/2016 1320   CREATININE 0.8 01/17/2016 1356      Component Value Date/Time   CALCIUM 8.8 10/20/2016 1320   CALCIUM 9.1 01/17/2016 1356   ALKPHOS 63 10/20/2016 1320   ALKPHOS 87 01/17/2016 1356   AST 52 (H) 10/20/2016 1320   AST 48 (H) 01/17/2016 1356   ALT 33 10/20/2016 1320   ALT 38 01/17/2016 1356   BILITOT 1.40 10/20/2016 1320   BILITOT 0.67 01/17/2016 1356      Impression and Plan: [name] is a very pleasant 71 yo caucasian female with both  hemochromatosis and polycythemia. She continues to do fairly well and has had no new problems come up since her last visit. Hct is stable at 45.5% and she would like to hold off on a phlebotomy at this time.  We will see what her iron studies show and if this have significantly increased we will re-evaluate her need for phlebotomy.  She will get Zometa today as planned. We will plan to see her back again in another 6 weeks for follow-up, lab and phlebotomy if needed.  She will contact our office with any questions or concerns. We can certainly see her sooner if need be.   Eliezer Bottom, NP  11/6/20182:29 PM

## 2016-12-02 LAB — IRON AND TIBC
%SAT: 60 % — ABNORMAL HIGH (ref 21–57)
Iron: 171 ug/dL — ABNORMAL HIGH (ref 41–142)
TIBC: 285 ug/dL (ref 236–444)
UIBC: 114 ug/dL — ABNORMAL LOW (ref 120–384)

## 2016-12-02 LAB — FERRITIN: Ferritin: 114 ng/ml (ref 9–269)

## 2016-12-03 ENCOUNTER — Other Ambulatory Visit: Payer: Self-pay | Admitting: Family

## 2016-12-03 DIAGNOSIS — M8008XG Age-related osteoporosis with current pathological fracture, vertebra(e), subsequent encounter for fracture with delayed healing: Secondary | ICD-10-CM

## 2016-12-03 DIAGNOSIS — M8080XA Other osteoporosis with current pathological fracture, unspecified site, initial encounter for fracture: Secondary | ICD-10-CM | POA: Insufficient documentation

## 2016-12-04 DIAGNOSIS — Z79899 Other long term (current) drug therapy: Secondary | ICD-10-CM | POA: Diagnosis not present

## 2016-12-04 DIAGNOSIS — E559 Vitamin D deficiency, unspecified: Secondary | ICD-10-CM | POA: Diagnosis not present

## 2016-12-04 DIAGNOSIS — E538 Deficiency of other specified B group vitamins: Secondary | ICD-10-CM | POA: Diagnosis not present

## 2016-12-04 DIAGNOSIS — R82998 Other abnormal findings in urine: Secondary | ICD-10-CM | POA: Diagnosis not present

## 2016-12-11 DIAGNOSIS — Z6824 Body mass index (BMI) 24.0-24.9, adult: Secondary | ICD-10-CM | POA: Diagnosis not present

## 2016-12-11 DIAGNOSIS — M810 Age-related osteoporosis without current pathological fracture: Secondary | ICD-10-CM | POA: Diagnosis not present

## 2016-12-11 DIAGNOSIS — M545 Low back pain: Secondary | ICD-10-CM | POA: Diagnosis not present

## 2016-12-11 DIAGNOSIS — Z1389 Encounter for screening for other disorder: Secondary | ICD-10-CM | POA: Diagnosis not present

## 2016-12-11 DIAGNOSIS — E538 Deficiency of other specified B group vitamins: Secondary | ICD-10-CM | POA: Diagnosis not present

## 2016-12-11 DIAGNOSIS — F331 Major depressive disorder, recurrent, moderate: Secondary | ICD-10-CM | POA: Diagnosis not present

## 2016-12-11 DIAGNOSIS — Z0000 Encounter for general adult medical examination without abnormal findings: Secondary | ICD-10-CM | POA: Diagnosis not present

## 2016-12-11 DIAGNOSIS — D508 Other iron deficiency anemias: Secondary | ICD-10-CM | POA: Diagnosis not present

## 2016-12-11 DIAGNOSIS — E559 Vitamin D deficiency, unspecified: Secondary | ICD-10-CM | POA: Diagnosis not present

## 2016-12-11 DIAGNOSIS — J45998 Other asthma: Secondary | ICD-10-CM | POA: Diagnosis not present

## 2016-12-11 DIAGNOSIS — D510 Vitamin B12 deficiency anemia due to intrinsic factor deficiency: Secondary | ICD-10-CM | POA: Diagnosis not present

## 2017-01-12 ENCOUNTER — Other Ambulatory Visit: Payer: Medicare Other

## 2017-01-12 ENCOUNTER — Ambulatory Visit: Payer: Medicare Other | Admitting: Family

## 2017-01-14 ENCOUNTER — Ambulatory Visit: Payer: Medicare Other | Admitting: Family

## 2017-01-14 ENCOUNTER — Other Ambulatory Visit: Payer: Medicare Other

## 2017-01-20 ENCOUNTER — Ambulatory Visit (HOSPITAL_BASED_OUTPATIENT_CLINIC_OR_DEPARTMENT_OTHER): Payer: Medicare Other | Admitting: Family

## 2017-01-20 ENCOUNTER — Encounter: Payer: Self-pay | Admitting: Family

## 2017-01-20 ENCOUNTER — Other Ambulatory Visit (HOSPITAL_BASED_OUTPATIENT_CLINIC_OR_DEPARTMENT_OTHER): Payer: Medicare Other

## 2017-01-20 ENCOUNTER — Other Ambulatory Visit: Payer: Self-pay

## 2017-01-20 VITALS — BP 123/66 | HR 89 | Temp 97.8°F | Resp 16 | Wt 140.0 lb

## 2017-01-20 DIAGNOSIS — D45 Polycythemia vera: Secondary | ICD-10-CM

## 2017-01-20 DIAGNOSIS — D500 Iron deficiency anemia secondary to blood loss (chronic): Secondary | ICD-10-CM

## 2017-01-20 DIAGNOSIS — G713 Mitochondrial myopathy, not elsewhere classified: Secondary | ICD-10-CM

## 2017-01-20 LAB — CBC WITH DIFFERENTIAL (CANCER CENTER ONLY)
BASO#: 0 10*3/uL (ref 0.0–0.2)
BASO%: 0.4 % (ref 0.0–2.0)
EOS%: 1.2 % (ref 0.0–7.0)
Eosinophils Absolute: 0.1 10*3/uL (ref 0.0–0.5)
HCT: 44.2 % (ref 34.8–46.6)
HGB: 15.5 g/dL (ref 11.6–15.9)
LYMPH#: 1.1 10*3/uL (ref 0.9–3.3)
LYMPH%: 16.4 % (ref 14.0–48.0)
MCH: 36 pg — ABNORMAL HIGH (ref 26.0–34.0)
MCHC: 35.1 g/dL (ref 32.0–36.0)
MCV: 103 fL — ABNORMAL HIGH (ref 81–101)
MONO#: 0.7 10*3/uL (ref 0.1–0.9)
MONO%: 9.8 % (ref 0.0–13.0)
NEUT#: 4.9 10*3/uL (ref 1.5–6.5)
NEUT%: 72.2 % (ref 39.6–80.0)
Platelets: 142 10*3/uL — ABNORMAL LOW (ref 145–400)
RBC: 4.3 10*6/uL (ref 3.70–5.32)
RDW: 11.9 % (ref 11.1–15.7)
WBC: 6.8 10*3/uL (ref 3.9–10.0)

## 2017-01-20 LAB — CMP (CANCER CENTER ONLY)
ALT(SGPT): 49 U/L — ABNORMAL HIGH (ref 10–47)
AST: 66 U/L — ABNORMAL HIGH (ref 11–38)
Albumin: 3.1 g/dL — ABNORMAL LOW (ref 3.3–5.5)
Alkaline Phosphatase: 68 U/L (ref 26–84)
BUN, Bld: 12 mg/dL (ref 7–22)
CO2: 25 mEq/L (ref 18–33)
Calcium: 8.9 mg/dL (ref 8.0–10.3)
Chloride: 107 mEq/L (ref 98–108)
Creat: 0.7 mg/dl (ref 0.6–1.2)
Glucose, Bld: 108 mg/dL (ref 73–118)
Potassium: 3.6 mEq/L (ref 3.3–4.7)
Sodium: 144 mEq/L (ref 128–145)
Total Bilirubin: 1 mg/dl (ref 0.20–1.60)
Total Protein: 6.5 g/dL (ref 6.4–8.1)

## 2017-01-20 NOTE — Progress Notes (Signed)
Hematology and Oncology Follow Up Visit  CHAUNCEY BRUNO [id number] [birth year] 71 y.o. 01/20/2017   Principle Diagnosis:  1. 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 Osteoporosis - with fracture at T11  Current Therapy:   1. Phlebotomy to maintain hematocrit below 45% 2. Aspirin 81 mg p.o. Daily. 3. IV iron as indicated - last received Dec/Jan x 2 4. Zometa IV every 6 months - next dose due in February 2019   Interim History:  Ms. Jessop is here today with her husband for follow-up. She is doing well but still has some fatigue. They had a nice relaxing Christmas and are looking forward to the [place].  Her iron saturation in November was 60% and ferritin 114. Iron studies for today are pending. Her Hct today is 44.2%. She takes her aspirin daily as prescribed.  No fever, chills, n/v, cough, rash, dizziness, SOB, chest pain, palpitations, abdominal pain or changes in bowel or bladder habits.  No swelling, tenderness, numbness or tingling in her extremities. No lymphadenopathy found on exam.  She has maintained a good appetite and is staying well hydrated. Her weight is stable.  Her tremor is unchanged. No falls or syncopal episodes.   ECOG Performance Status: 1 - Symptomatic but completely ambulatory  Medications:  Allergies as of 01/20/2017      Reactions   Dulera [mometasone Furo-formoterol Fum] Cough      Medication List        Accurate as of 01/20/17  2:42 PM. Always use your most recent med list.          albuterol 108 (90 Base) MCG/ACT inhaler Commonly known as:  PROVENTIL HFA;VENTOLIN HFA Inhale 2 puffs into the lungs every 4 (four) hours as needed for wheezing or shortness of breath.   aspirin 81 MG chewable tablet Chew by mouth.   diazepam 10 MG tablet Commonly known as:  VALIUM Take 10 mg by mouth every 6 (six)  hours as needed for anxiety.   LOPREEZA 0.5-0.1 MG tablet Generic drug:  Estradiol-Norethindrone Acet Take 1 tablet by mouth daily.   LORazepam 0.5 MG tablet Commonly known as:  ATIVAN TAKE 1 TABLET BY MOUTH EVERY NIGHT AT BEDTIME   pantoprazole 40 MG tablet Commonly known as:  PROTONIX take 1 tablet by mouth twice a day before meals   SYMBICORT 80-4.5 MCG/ACT inhaler Generic drug:  budesonide-formoterol Inhale 2 puffs into the lungs 2 (two) times daily.   Vitamin D (Ergocalciferol) 50000 units Caps capsule Commonly known as:  DRISDOL Take 1 capsule (50,000 Units total) by mouth every 7 (seven) days. Sundays       Allergies:  Allergies  Allergen Reactions  . Dulera [Mometasone Furo-Formoterol Fum] Cough    Past Medical History, Surgical history, Social history, and Family History were reviewed and updated.  Review of Systems: All other 10 point review of systems is negative.   Physical Exam:  weight is 140 lb (63.5 kg). Her oral temperature is 97.8 F (36.6 C). Her blood pressure is 123/66 and her pulse is 89. Her respiration is 16 and oxygen saturation is 96%.   Wt Readings from Last 3 Encounters:  01/20/17 140 lb (63.5 kg)  12/01/16 138 lb 6.4 oz (62.8 kg)  10/20/16 135 lb (61.2 kg)    Ocular: Sclerae unicteric, pupils equal, round and reactive to light Ear-nose-throat: Oropharynx clear, dentition fair Lymphatic: No cervical, supraclavicular or axillary  adenopathy Lungs no rales or rhonchi, good excursion bilaterally Heart regular rate and rhythm, no murmur appreciated Abd soft, nontender, positive bowel sounds, no liver or spleen tip palpated on exam, no fluid wave  MSK no focal spinal tenderness, no joint edema Neuro: non-focal, well-oriented, appropriate affect Breasts: Deferred   Lab Results  Component Value Date   WBC 6.8 01/20/2017   HGB 15.5 01/20/2017   HCT 44.2 01/20/2017   MCV 103 (H) 01/20/2017   PLT 142 (L) 01/20/2017   Lab Results    Component Value Date   FERRITIN 114 12/01/2016   IRON 171 (H) 12/01/2016   TIBC 285 12/01/2016   UIBC 114 (L) 12/01/2016   IRONPCTSAT 60 (H) 12/01/2016   Lab Results  Component Value Date   RETICCTPCT 1.5 06/12/2014   RBC 4.30 01/20/2017   RETICCTABS 65.9 06/12/2014   Lab Results  Component Value Date   KAPLAMBRATIO 0.91 04/27/2016   Lab Results  Component Value Date   IGGSERUM 897 04/27/2016   IGA 339 02/27/2014   IGMSERUM 132 04/27/2016   Lab Results  Component Value Date   MSPIKE Not Observed 04/27/2016     Chemistry      Component Value Date/Time   NA 144 01/20/2017 1344   NA 138 01/17/2016 1356   K 3.6 01/20/2017 1344   K 4.1 01/17/2016 1356   CL 107 01/20/2017 1344   CO2 25 01/20/2017 1344   CO2 25 01/17/2016 1356   BUN 12 01/20/2017 1344   BUN 12.3 01/17/2016 1356   CREATININE 0.7 01/20/2017 1344   CREATININE 0.8 01/17/2016 1356      Component Value Date/Time   CALCIUM 8.9 01/20/2017 1344   CALCIUM 9.1 01/17/2016 1356   ALKPHOS 68 01/20/2017 1344   ALKPHOS 87 01/17/2016 1356   AST 66 (H) 01/20/2017 1344   AST 48 (H) 01/17/2016 1356   ALT 49 (H) 01/20/2017 1344   ALT 38 01/17/2016 1356   BILITOT 1.00 01/20/2017 1344   BILITOT 0.67 01/17/2016 1356      Impression and Plan: Ms. Estelle is a very pleasant 71 yo caucasian female with both hemochromatosis and polycythemia. She is doing well but still having intermittent fatigue.  Hct is stable at 44.2%. We will hold off on phlebotomy at this time.  We will see what her iron studies from today show. Last iron saturation was 60% with ferritin of 114.  We will go ahead and plan to see her back again in another 2 months for follow-up.  Both she and her husband know to contact our office with any questions or concerns. We can certainly see her sooner if need be.   Laverna Peace, NP 12/26/20182:42 PM

## 2017-01-21 LAB — IRON AND TIBC
%SAT: 51 % (ref 21–57)
Iron: 129 ug/dL (ref 41–142)
TIBC: 254 ug/dL (ref 236–444)
UIBC: 125 ug/dL (ref 120–384)

## 2017-01-21 LAB — LACTATE DEHYDROGENASE: LDH: 206 U/L (ref 125–245)

## 2017-01-21 LAB — FERRITIN: Ferritin: 122 ng/ml (ref 9–269)

## 2017-02-28 ENCOUNTER — Observation Stay (HOSPITAL_COMMUNITY)
Admission: EM | Admit: 2017-02-28 | Discharge: 2017-03-01 | Disposition: A | Discharge status: Home / Self Care | Payer: Medicare Other | Attending: Internal Medicine | Admitting: Internal Medicine

## 2017-02-28 ENCOUNTER — Encounter (HOSPITAL_COMMUNITY): Payer: Self-pay | Admitting: *Deleted

## 2017-02-28 ENCOUNTER — Emergency Department (HOSPITAL_COMMUNITY): Payer: Medicare Other

## 2017-02-28 ENCOUNTER — Other Ambulatory Visit: Payer: Self-pay

## 2017-02-28 DIAGNOSIS — J449 Chronic obstructive pulmonary disease, unspecified: Secondary | ICD-10-CM | POA: Diagnosis not present

## 2017-02-28 DIAGNOSIS — S0990XA Unspecified injury of head, initial encounter: Secondary | ICD-10-CM | POA: Diagnosis not present

## 2017-02-28 DIAGNOSIS — S199XXA Unspecified injury of neck, initial encounter: Secondary | ICD-10-CM | POA: Diagnosis not present

## 2017-02-28 DIAGNOSIS — Z79899 Other long term (current) drug therapy: Secondary | ICD-10-CM | POA: Diagnosis not present

## 2017-02-28 DIAGNOSIS — I609 Nontraumatic subarachnoid hemorrhage, unspecified: Secondary | ICD-10-CM | POA: Diagnosis not present

## 2017-02-28 DIAGNOSIS — N189 Chronic kidney disease, unspecified: Secondary | ICD-10-CM | POA: Diagnosis not present

## 2017-02-28 DIAGNOSIS — W19XXXA Unspecified fall, initial encounter: Secondary | ICD-10-CM | POA: Diagnosis not present

## 2017-02-28 DIAGNOSIS — Z7982 Long term (current) use of aspirin: Secondary | ICD-10-CM | POA: Insufficient documentation

## 2017-02-28 DIAGNOSIS — Y999 Unspecified external cause status: Secondary | ICD-10-CM | POA: Diagnosis not present

## 2017-02-28 DIAGNOSIS — S066X9A Traumatic subarachnoid hemorrhage with loss of consciousness of unspecified duration, initial encounter: Secondary | ICD-10-CM | POA: Insufficient documentation

## 2017-02-28 DIAGNOSIS — S065X9A Traumatic subdural hemorrhage with loss of consciousness of unspecified duration, initial encounter: Secondary | ICD-10-CM | POA: Diagnosis not present

## 2017-02-28 DIAGNOSIS — S065XAA Traumatic subdural hemorrhage with loss of consciousness status unknown, initial encounter: Secondary | ICD-10-CM

## 2017-02-28 DIAGNOSIS — Y92009 Unspecified place in unspecified non-institutional (private) residence as the place of occurrence of the external cause: Secondary | ICD-10-CM | POA: Diagnosis not present

## 2017-02-28 DIAGNOSIS — R079 Chest pain, unspecified: Secondary | ICD-10-CM | POA: Diagnosis not present

## 2017-02-28 DIAGNOSIS — S0993XA Unspecified injury of face, initial encounter: Secondary | ICD-10-CM | POA: Diagnosis not present

## 2017-02-28 DIAGNOSIS — R51 Headache: Secondary | ICD-10-CM | POA: Diagnosis not present

## 2017-02-28 LAB — BASIC METABOLIC PANEL
Anion gap: 7 (ref 5–15)
BUN: 12 mg/dL (ref 6–20)
CO2: 24 mmol/L (ref 22–32)
Calcium: 8.7 mg/dL — ABNORMAL LOW (ref 8.9–10.3)
Chloride: 107 mmol/L (ref 101–111)
Creatinine, Ser: 0.49 mg/dL (ref 0.44–1.00)
GFR calc Af Amer: >60 mL/min (ref >60)
GFR calc non Af Amer: >60 mL/min (ref >60)
Glucose, Bld: 102 mg/dL — ABNORMAL HIGH (ref 65–99)
Potassium: 3.7 mmol/L (ref 3.5–5.1)
Sodium: 138 mmol/L (ref 135–145)

## 2017-02-28 LAB — CBC WITH DIFFERENTIAL/PLATELET
Basophils Absolute: 0 10*3/uL (ref 0.0–0.1)
Basophils Relative: 0 %
Eosinophils Absolute: 0 10*3/uL (ref 0.0–0.7)
Eosinophils Relative: 1 %
HCT: 42.4 % (ref 36.0–46.0)
Hemoglobin: 14.7 g/dL (ref 12.0–15.0)
Lymphocytes Relative: 12 %
Lymphs Abs: 1.1 10*3/uL (ref 0.7–4.0)
MCH: 35.4 pg — ABNORMAL HIGH (ref 26.0–34.0)
MCHC: 34.7 g/dL (ref 30.0–36.0)
MCV: 102.2 fL — ABNORMAL HIGH (ref 78.0–100.0)
Monocytes Absolute: 1 10*3/uL (ref 0.1–1.0)
Monocytes Relative: 11 %
Neutro Abs: 6.8 10*3/uL (ref 1.7–7.7)
Neutrophils Relative %: 76 %
Platelets: 132 10*3/uL — ABNORMAL LOW (ref 150–400)
RBC: 4.15 MIL/uL (ref 3.87–5.11)
RDW: 12.5 % (ref 11.5–15.5)
WBC: 8.9 10*3/uL (ref 4.0–10.5)

## 2017-02-28 MED ORDER — ALBUTEROL SULFATE (2.5 MG/3ML) 0.083% IN NEBU: 2.5000 mg | INHALATION_SOLUTION | RESPIRATORY_TRACT | Status: DC | PRN

## 2017-02-28 MED ORDER — VITAMIN D (ERGOCALCIFEROL) 1.25 MG (50000 UNIT) PO CAPS: 50000.0000 [IU] | ORAL_CAPSULE | ORAL | Status: DC

## 2017-02-28 MED ORDER — MOMETASONE FURO-FORMOTEROL FUM 100-5 MCG/ACT IN AERO
2.0000 | INHALATION_SPRAY | Freq: Two times a day (BID) | RESPIRATORY_TRACT | Status: DC
Start: 2017-02-28 — End: 2017-03-01
  Filled 2017-02-28: qty 8.8

## 2017-02-28 MED ORDER — ACETAMINOPHEN 325 MG PO TABS
650.0000 mg | ORAL_TABLET | Freq: Four times a day (QID) | ORAL | Status: DC | PRN
  Administered 2017-02-28 (×2): 650 mg via ORAL
  Filled 2017-02-28 (×2): qty 2

## 2017-02-28 MED ORDER — LORAZEPAM 0.5 MG PO TABS
0.5000 mg | ORAL_TABLET | Freq: Every evening | ORAL | Status: DC | PRN
  Administered 2017-02-28: 0.5 mg via ORAL
  Filled 2017-02-28: qty 1

## 2017-02-28 MED ORDER — ESTRADIOL-NORETHINDRONE ACET 0.5-0.1 MG PO TABS: 1.0000 | ORAL_TABLET | Freq: Every day | ORAL | Status: DC

## 2017-02-28 MED ORDER — PANTOPRAZOLE SODIUM 40 MG PO TBEC
40.0000 mg | DELAYED_RELEASE_TABLET | Freq: Every day | ORAL | Status: DC
  Administered 2017-03-01: 40 mg via ORAL
  Filled 2017-02-28: qty 1

## 2017-02-28 NOTE — Progress Notes (Signed)
Patient arrived via [facility], unaccompanied, alert and oriented X 4. Patient suffered a fall and CT at [facility] showed a SDH. She is here for observation and repeat CT in the am. Patient has swelling and ecchymosis to her R cheek and periorital area.. She also has various ecchymotic area on bilateral arms d/t thrombocyopenis, per patient. Skin otherwise intacl.  Telemetry attached, patient comfortable in be. Bed alarm on. Safety maintaine.

## 2017-02-28 NOTE — ED Notes (Signed)
Patient transported to X-ray 

## 2017-02-28 NOTE — ED Notes (Signed)
Call report to [facility] and said the nurse is not ready to take report right now that they will call me back.

## 2017-02-28 NOTE — ED Provider Notes (Signed)
[facility] DEPT Provider Note   CSN: [id number] Arrival date & time: 02/28/17  1150     History   Chief Complaint Chief Complaint  Patient presents with  . Fall  . Headache    HPI [name] is a 72 y.o. female who presents for evaluation after an unwitnessed fall that occurred last night.  Patient states that she was upstairs when she had a fall.  Husband reports that he was downstairs and heard the thump.  He went up to go find her note that she did have some LOC.  He reports that patient had fallen and landed against a bookcase and knocked over.  Husband reports that patient had brief LOC and then returned back to normal.  Patient states that she did not have any dizziness or chest pain preceding fall.  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  Patient comes emergency department today because she has continued to have right headache.  Patient is not currently on blood thinners.  Husband reports that patient has been at her normal baseline.  Patient denies any vision changes, chest pain, dizziness, numbness/weakness, vomiting, abdominal pain.  The history is provided by the patient.    Past Medical History:  Diagnosis Date  . Allergy   . Anemia   . Anxiety   . Asthma    DR. BYRUM  . Blood transfusion without reported diagnosis   . Cholelithiasis   . Chronic kidney disease    gallstones   . Compression fracture of body of thoracic vertebra (HCC)   . Depression   . Dyspnea   . Dysrhythmia    palpitations  . GERD (gastroesophageal reflux disease)   . Heart attack ([place])   . Hemorrhoids   . IBS (irritable bowel syndrome)   . Iliotibial band syndrome    left knee  . Migraine   . Mitochondrial myopathy   . Normal coronary arteries    by cardiac catheterization performed by myself 02/25/01  . Osteoporosis   . Pericarditis    age 52  . Pneumothorax   . Polycythemia vera(238.4)  06/17/2012  . PVC's (premature ventricular contractions)   . Vitamin D deficiency     Patient Active Problem List   Diagnosis Date Noted  . SDH (subdural hematoma) ([place]) 02/28/2017  . Osteoporosis with fracture 12/03/2016  . 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 Age-related osteoporosis without current pathological fracture 07/08/2016  . IDA (iron deficiency anemia) 10/30/2015  . Need for prophylactic vaccination and inoculation against influenza 10/25/2015  . Laryngopharyngeal reflux (LPR) 09/03/2015  . Cough 02/21/2015  . 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 Iron deficiency anemia 10/12/2013  . DOE (dyspnea on exertion) 01/30/2013  . Polycythemia vera ([place]) 06/17/2012  . 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 History of atypical migraine 03/03/2012  . Nuclear cataract 03/03/2012  . Posterior vitreous detachment 03/03/2012  . Low back pain 06/03/2011  . Back pain 06/03/2011  . Chest pain 09/28/2010  . Personal history of endocrine/metabolic/immunity disorder 04/04/2010  . Encounter for long-term (current) use of medications 04/04/2010  . METATARSALGIA 03/18/2010  . IDIOPATHIC OSTEOPOROSIS 03/18/2010  . INSOMNIA UNSPECIFIED 08/13/2009  . Anxiety state 11/29/2008  . ILIOTIBIAL BAND SYNDROME, LEFT KNEE 08/23/2008  . MIGRAINE HEADACHE 07/28/2007  . UNSPECIFIED MYOPATHY 07/28/2007  . 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 TREMOR, ESSENTIAL/FAMILIAL 03/25/2006  . COPD (chronic obstructive pulmonary disease) ([place]) 03/25/2006  . IRRITABLE BOWEL SYNDROME 03/25/2006  . MENOPAUSAL SYNDROME 03/25/2006    Past  Surgical History:  Procedure Laterality Date  . APPENDECTOMY    . CARDIAC CATHETERIZATION    . CATARACT EXTRACTION W/ INTRAOCULAR LENS  IMPLANT, BILATERAL    . CESAREAN SECTION     x 4  . COLONOSCOPY    . DILATATION & CURETTAGE/HYSTEROSCOPY WITH MYOSURE N/A 10/29/2014   Procedure: DILATATION & CURETTAGE/HYSTEROSCOPY WITH MYOSURE;  Surgeon: Paula Compton, MD;  Location: [facility];  Service:  Gynecology;  Laterality: N/A;  . IR FLUORO GUIDED NEEDLE PLC ASPIRATION/INJECTION LOC  06/10/2016  . IR FLUORO GUIDED NEEDLE PLC ASPIRATION/INJECTION LOC  06/10/2016  . KYPHOPLASTY N/A 05/14/2016   Procedure: T 11 KYPHOPLASTY;  Surgeon: Phylliss Bob, MD;  Location: [facility];  Service: Orthopedics;  Laterality: N/A;  T 11 KYPHOPLASTY  . KYPHOPLASTY N/A 06/18/2016   Procedure: THORACIC 12 KYPHOPLASTY;  Surgeon: Phylliss Bob, MD;  Location: [facility];  Service: Orthopedics;  Laterality: N/A;  THORACIC 12 KYPHOPLASTY; REQUEST 1 HOUR AND FLIP ROOM  . MOUTH SURGERY    . NASAL SINUS SURGERY    . NM MYOCAR PERF WALL MOTION  04/27/2006   normal  . US ECHOCARDIOGRAPHY  12/25/2010   normal  . VEIN SURGERY      OB History    No data available       Home Medications    Prior to Admission medications   Medication Sig Start Date End Date Taking? Authorizing Provider  aspirin 81 MG chewable tablet Chew 81 mg by mouth daily.    Yes [provider]  Estradiol-Norethindrone Acet (LOPREEZA) 0.5-0.1 MG per tablet Take 1 tablet by mouth daily.   Yes [provider]  ibuprofen (ADVIL,MOTRIN) 200 MG tablet Take 400-600 mg by mouth every 6 (six) hours as needed for headache.   Yes [provider]  LORazepam (ATIVAN) 0.5 MG tablet TAKE 1 TABLET BY MOUTH EVERY NIGHT AT BEDTIME 05/01/14  Yes Leandrew Koyanagi, MD  pantoprazole (PROTONIX) 40 MG tablet take 1 tablet by mouth daily before meals 10/13/16  Yes [provider]  PRESCRIPTION MEDICATION Take 1 tablet by mouth as needed (diarrhea). Old anti-diarrheal prescription, unsure of name.   Yes [provider]  SYMBICORT 80-4.5 MCG/ACT inhaler Inhale 2 puffs into the lungs 2 (two) times daily. 09/15/16  Yes [provider]  Vitamin D, Ergocalciferol, (DRISDOL) 50000 units CAPS capsule Take 1 capsule (50,000 Units total) by mouth every 7 (seven) days. Sundays 07/08/16  Yes [name], MD  albuterol (PROVENTIL  HFA;VENTOLIN HFA) 108 (90 Base) MCG/ACT inhaler Inhale 2 puffs into the lungs every 4 (four) hours as needed for wheezing or shortness of breath. 01/15/16   Collene Gobble, MD  [facility] injection Inject 0.5 mLs into the muscle once. 01/21/17   [provider]    Family History Family History  Problem Relation Age of Onset  . Asthma Mother   . Arthritis Mother   . Depression Mother   . Cancer Father   . Emphysema Father   . Stroke Father   . Alcohol abuse Father   . Heart attack Father   . Colon cancer Neg Hx     Social History Social History   Tobacco Use  . Smoking status: Never Smoker  . Smokeless tobacco: Never Used  . Tobacco comment: never used tobacco  Substance Use Topics  . Alcohol use: Yes    Alcohol/week: 1.2 oz    Types: 2 Glasses of wine per week    Comment: occasionally  . Drug use: No  Allergies   Dulera [mometasone furo-formoterol fum]   Review of Systems Review of Systems  Constitutional: Negative for fever.  Eyes: Negative for visual disturbance.  Respiratory: Negative for cough and shortness of breath.   Cardiovascular: Negative for chest pain.  Gastrointestinal: Negative for abdominal pain, nausea and vomiting.  Genitourinary: Negative for dysuria and hematuria.  Neurological: Positive for headaches. Negative for dizziness, weakness and numbness.  All other systems reviewed and are negative.    Physical Exam Updated Vital Signs BP 134/71 (BP Location: Left Arm)   Pulse 86   Temp 98.1 F (36.7 C) (Oral)   Resp 19   Ht 5\' 3"  (1.6 m)   Wt 63.5 kg (140 lb)   SpO2 96%   BMI 24.80 kg/m   Physical Exam  Constitutional: She is oriented to person, place, and time. She appears well-developed and well-nourished.  HENT:  Head: Normocephalic and atraumatic.  Right Ear: Tympanic membrane normal. No hemotympanum.  Left Ear: Tympanic membrane normal. No hemotympanum.  Mouth/Throat: Oropharynx is clear and moist and mucous membranes  are normal.  Diffuse ecchymosis surrounding the right temporal region that extends around to the right periorbital region down to the right cheek.  Diffuse tenderness palpation.  No deformity or crepitus noted.  No abrasions, laceration noted to the head.  Eyes: Conjunctivae, EOM and lids are normal. Pupils are equal, round, and reactive to light. Right conjunctiva is not injected. Left conjunctiva is not injected.  EOMs intact without any difficulty.  Neck: Full passive range of motion without pain.  Cardiovascular: Normal rate, regular rhythm, normal heart sounds and normal pulses. Exam reveals no gallop and no friction rub.  No murmur heard. Pulmonary/Chest: Effort normal and breath sounds normal.  No evidence of respiratory distress. Able to speak in full sentences without difficulty.  Tenderness palpation overlying the right lateral chest wall at approximately rib 9 and 10.  No deformity or crepitus noted.  Abdominal: Soft. Normal appearance. There is no tenderness. There is no rigidity and no guarding.  Musculoskeletal: Normal range of motion.  Neurological: She is alert and oriented to person, place, and time. GCS eye subscore is 4. GCS verbal subscore is 5. GCS motor subscore is 6.  Cranial nerves III-XII intact Follows commands, Moves all extremities  5/5 strength to BUE and BLE  Sensation intact throughout all major nerve distributions Slightly tremulous on finger-to-nose but husband states that patient has tremor at baseline.  No dysdiadochokinesia. No pronator drift. No slurred speech. No facial droop.   Skin: Skin is warm and dry. Capillary refill takes less than 2 seconds.  Psychiatric: She has a normal mood and affect. Her speech is normal.  Nursing note and vitals reviewed.    ED Treatments / Results  Labs (all labs ordered are listed, but only abnormal results are displayed) Labs Reviewed  CBC WITH DIFFERENTIAL/PLATELET - Abnormal; Notable for the following components:        Result Value   MCV 102.2 (*)    MCH 35.4 (*)    Platelets 132 (*)    All other components within normal limits  BASIC METABOLIC PANEL - Abnormal; Notable for the following components:   Glucose, Bld 102 (*)    Calcium 8.7 (*)    All other components within normal limits    EKG  EKG Interpretation None       Radiology Dg Chest 2 View  Result Date: 02/28/2017 CLINICAL DATA:  Left chest pain following a fall yesterday. EXAM: CHEST  2 VIEW COMPARISON:  05/14/2016. FINDINGS: Borderline enlarged cardiac silhouette. Clear lungs. To lower thoracic vertebral compression deformities with interval kyphoplasty material. Stable L2 vertebral compression deformity. Old, healed right humeral neck fracture without significant change. IMPRESSION: No acute abnormality. Electronically Signed   By: Claudie Revering M.D.   On: 02/28/2017 13:38   Ct Head Wo Contrast  Result Date: 02/28/2017 CLINICAL DATA:  Unwitnessed fall last night. Patient complains of headache. Significant bruising to the right eye and face. EXAM: CT HEAD WITHOUT CONTRAST CT MAXILLOFACIAL WITHOUT CONTRAST CT CERVICAL SPINE WITHOUT CONTRAST TECHNIQUE: Multidetector CT imaging of the head, cervical spine, and maxillofacial structures were performed using the standard protocol without intravenous contrast. Multiplanar CT image reconstructions of the cervical spine and maxillofacial structures were also generated. COMPARISON:  None. FINDINGS: CT HEAD FINDINGS Brain: There is bilateral cerebral subarachnoid hemorrhage. It could be seen in the right temporal, right occipital, right frontal, left frontal, left occipital lobes. Minute foci of intraparenchymal hemorrhage in the convexity of the right frontal lobe. There is a thin right frontoparietal subdural hematoma, measuring 5 mm in maximum thickness frontally. No evidence of acute infarction. No evidence of significant mass effect or midline shift. Moderate brain parenchymal volume loss and mild  white matter microangiopathy. Vascular: No hyperdense vessel or unexpected calcification. Skull: Normal. Negative for fracture or focal lesion. Other: Right frontal scalp hematoma. CT MAXILLOFACIAL FINDINGS Osseous: No fracture or mandibular dislocation. No destructive process. Orbits: Negative. No traumatic or inflammatory finding. Sinuses: Partial opacification of the left sphenoid sinus. Soft tissues: Right preseptal periorbital soft tissue hematoma with extension to the right face. CT CERVICAL SPINE FINDINGS Alignment: Straightening of cervical lordosis. Skull base and vertebrae: No acute fracture. No primary bone lesion or focal pathologic process. Soft tissues and spinal canal: No prevertebral fluid or swelling. No visible canal hematoma. Disc levels: 3 mild osteoarthritic changes at C5-C6. Moderate multilevel posterior facet arthropathy. Upper chest: Negative. Other: None. IMPRESSION: Bilateral cerebral subarachnoid hemorrhage, most prevalent in the right temporal lobe. Thin right frontoparietal subdural hematoma, measuring 4.5 mm in maximum thickness frontally. Minute foci of intraparenchymal hemorrhage in the right frontal lobe. No evidence of significant mass effect. No evidence of skull or facial fractures. Right frontal, periorbital and facial hematoma. No evidence of acute traumatic injury to the cervical spine. These results were called by telephone at the time of interpretation on 02/28/2017 at 2:04 pm to PA [facility] , who verbally acknowledged these results. Electronically Signed   By: Fidela Salisbury M.D.   On: 02/28/2017 14:24   Ct Cervical Spine Wo Contrast  Result Date: 02/28/2017 CLINICAL DATA:  Unwitnessed fall last night. Patient complains of headache. Significant bruising to the right eye and face. EXAM: CT HEAD WITHOUT CONTRAST CT MAXILLOFACIAL WITHOUT CONTRAST CT CERVICAL SPINE WITHOUT CONTRAST TECHNIQUE: Multidetector CT imaging of the head, cervical spine, and maxillofacial  structures were performed using the standard protocol without intravenous contrast. Multiplanar CT image reconstructions of the cervical spine and maxillofacial structures were also generated. COMPARISON:  None. FINDINGS: CT HEAD FINDINGS Brain: There is bilateral cerebral subarachnoid hemorrhage. It could be seen in the right temporal, right occipital, right frontal, left frontal, left occipital lobes. Minute foci of intraparenchymal hemorrhage in the convexity of the right frontal lobe. There is a thin right frontoparietal subdural hematoma, measuring 5 mm in maximum thickness frontally. No evidence of acute infarction. No evidence of significant mass effect or midline shift. Moderate brain parenchymal volume loss and mild white matter microangiopathy. Vascular:  No hyperdense vessel or unexpected calcification. Skull: Normal. Negative for fracture or focal lesion. Other: Right frontal scalp hematoma. CT MAXILLOFACIAL FINDINGS Osseous: No fracture or mandibular dislocation. No destructive process. Orbits: Negative. No traumatic or inflammatory finding. Sinuses: Partial opacification of the left sphenoid sinus. Soft tissues: Right preseptal periorbital soft tissue hematoma with extension to the right face. CT CERVICAL SPINE FINDINGS Alignment: Straightening of cervical lordosis. Skull base and vertebrae: No acute fracture. No primary bone lesion or focal pathologic process. Soft tissues and spinal canal: No prevertebral fluid or swelling. No visible canal hematoma. Disc levels: 3 mild osteoarthritic changes at C5-C6. Moderate multilevel posterior facet arthropathy. Upper chest: Negative. Other: None. IMPRESSION: Bilateral cerebral subarachnoid hemorrhage, most prevalent in the right temporal lobe. Thin right frontoparietal subdural hematoma, measuring 4.5 mm in maximum thickness frontally. Minute foci of intraparenchymal hemorrhage in the right frontal lobe. No evidence of significant mass effect. No evidence of  skull or facial fractures. Right frontal, periorbital and facial hematoma. No evidence of acute traumatic injury to the cervical spine. These results were called by telephone at the time of interpretation on 02/28/2017 at 2:04 pm to PA [facility] , who verbally acknowledged these results. Electronically Signed   By: Fidela Salisbury M.D.   On: 02/28/2017 14:24   Ct Maxillofacial Wo Cm  Result Date: 02/28/2017 CLINICAL DATA:  Unwitnessed fall last night. Patient complains of headache. Significant bruising to the right eye and face. EXAM: CT HEAD WITHOUT CONTRAST CT MAXILLOFACIAL WITHOUT CONTRAST CT CERVICAL SPINE WITHOUT CONTRAST TECHNIQUE: Multidetector CT imaging of the head, cervical spine, and maxillofacial structures were performed using the standard protocol without intravenous contrast. Multiplanar CT image reconstructions of the cervical spine and maxillofacial structures were also generated. COMPARISON:  None. FINDINGS: CT HEAD FINDINGS Brain: There is bilateral cerebral subarachnoid hemorrhage. It could be seen in the right temporal, right occipital, right frontal, left frontal, left occipital lobes. Minute foci of intraparenchymal hemorrhage in the convexity of the right frontal lobe. There is a thin right frontoparietal subdural hematoma, measuring 5 mm in maximum thickness frontally. No evidence of acute infarction. No evidence of significant mass effect or midline shift. Moderate brain parenchymal volume loss and mild white matter microangiopathy. Vascular: No hyperdense vessel or unexpected calcification. Skull: Normal. Negative for fracture or focal lesion. Other: Right frontal scalp hematoma. CT MAXILLOFACIAL FINDINGS Osseous: No fracture or mandibular dislocation. No destructive process. Orbits: Negative. No traumatic or inflammatory finding. Sinuses: Partial opacification of the left sphenoid sinus. Soft tissues: Right preseptal periorbital soft tissue hematoma with extension to the right  face. CT CERVICAL SPINE FINDINGS Alignment: Straightening of cervical lordosis. Skull base and vertebrae: No acute fracture. No primary bone lesion or focal pathologic process. Soft tissues and spinal canal: No prevertebral fluid or swelling. No visible canal hematoma. Disc levels: 3 mild osteoarthritic changes at C5-C6. Moderate multilevel posterior facet arthropathy. Upper chest: Negative. Other: None. IMPRESSION: Bilateral cerebral subarachnoid hemorrhage, most prevalent in the right temporal lobe. Thin right frontoparietal subdural hematoma, measuring 4.5 mm in maximum thickness frontally. Minute foci of intraparenchymal hemorrhage in the right frontal lobe. No evidence of significant mass effect. No evidence of skull or facial fractures. Right frontal, periorbital and facial hematoma. No evidence of acute traumatic injury to the cervical spine. These results were called by telephone at the time of interpretation on 02/28/2017 at 2:04 pm to PA [facility] , who verbally acknowledged these results. Electronically Signed   By: Fidela Salisbury M.D.   On:  02/28/2017 14:24    Procedures Procedures (including critical care time)  Medications Ordered in ED Medications  albuterol (PROVENTIL HFA;VENTOLIN HFA) 108 (90 Base) MCG/ACT inhaler 2 puff (not administered)  Estradiol-Norethindrone Acet 0.5-0.1 MG 1 tablet (not administered)  LORazepam (ATIVAN) tablet 0.5 mg (not administered)  pantoprazole (PROTONIX) EC tablet 40 mg (not administered)  mometasone-formoterol (DULERA) 100-5 MCG/ACT inhaler 2 puff (not administered)  Vitamin D (Ergocalciferol) (DRISDOL) capsule 50,000 Units (not administered)  acetaminophen (TYLENOL) tablet 650 mg (650 mg Oral Given 02/28/17 1656)     Initial Impression / Assessment and Plan / ED Course  I have reviewed the triage vital signs and the nursing notes.  Pertinent labs & imaging results that were available during my care of the patient were reviewed by me and  considered in my medical decision making (see chart for details).     72 year old female who presents for headache status post an unwitnessed fall that occurred last night.  Husband reports that when he found patient, she had a brief LOC. No vision, vomiting changes. She is not currently on blood thinners. Patient is afebrile, non-toxic appearing, sitting comfortably on examination table. Vital signs reviewed and stable. No neuro deficits noted on exam. EOMs intact without difficulty. No evidence of entrapment.  Low suspicion for CVA, orbital wall fracture but given fall, significant tenderness, will plan for CT evaluation.  Will also get chest x-ray given patient's right lateral chest wall tenderness for evaluation of rib fracture.   Imaging reviewed.  CT head shows bilateral cerebral subarachnoid hemorrhage, most prevalent in the right temporal lobe.  There is also a frontoparietal subdural hematoma noted.  There is intraparenchymal hemorrhage in the lobe.  No evidence of skull or facial fractures.  No cervical fractures. CXR unremarkable.   I discussed with patient privately without her husband.  She denies any abuse at home.  She states that she feels safe at home with her husband.  Discussed patient with Dr. Annette Stable (Neurosurgery) regarding CT head findings.  Does not feel that this requires surgical intervention at this time.  Recommends medical admission for observation with plans to follow-up imaging tomorrow for progression. Consult to hospitalist placed.   Discussed patient with Dr. Daleen Bo (hospitalist). Will admit.   Final Clinical Impressions(s) / ED Diagnoses   Final diagnoses:  Subarachnoid hemorrhage ([place])  Subdural hematoma [facility])    ED Discharge Orders    None       [name], PA-C 02/28/17 2207

## 2017-02-28 NOTE — ED Provider Notes (Signed)
Medical screening examination/treatment/procedure(s) were conducted as a shared visit with non-physician practitioner(s) and myself.  I personally evaluated the patient during the encounter.   EKG Interpretation None     72 year old female here after mechanical fall yesterday.  No LOC.  No confusion or vomiting.  No focal weakness.  Has mild headache and CT shows subarachnoid hemorrhage as well as small frontal subdural hematoma.  Will consult neurosurgery.  Patient will be admitted to the hospital   Lacretia Leigh, MD 02/28/17 1423

## 2017-02-28 NOTE — H&P (Signed)
[facility] History and Physical  [name] ZOX:[id number] DOB: [birth year] DOA: 02/28/2017  Referring physician:  PCP: Velna Hatchet, MD  Specialists:   Chief Complaint: fall  HPI: [name] is a 72 y.o. female with PMH of Familial tremors syndrome, Mitochondrial myopahty with recurrent falls, alcohol use, asthma presented with fall. Patient states that they have had some guest last  Night. She reports chronic gait instability due to mitochondrial myopathy. She felt tired last night and went to second floor oh her house. Husband reports that heard the thump. He went to see her wife and found her on the floor unconscious for few seconds then returned back to normal. He reports that patient had fallen and landed against bookcase. No seizure like activity, no urinary or bowel incontinence, no tongue biting. Patient denies any focal weakness or paresthesias prior or after the fall. No vertigo, no palpations, no acute cardiopulmonary symptoms. She reports drinking some alcohol last night with her guests.  -ED: CT head: multiple bleeds. SDH, SAH, small intracerebral bleed. Labs pend. ED d/w neurosurgery, who recommended observation, repeat CT AM  Review of Systems: The patient denies anorexia, fever, weight loss,, vision loss, decreased hearing, hoarseness, chest pain, syncope, dyspnea on exertion, peripheral edema, balance deficits, hemoptysis, abdominal pain, melena, hematochezia, severe indigestion/heartburn, hematuria, incontinence, genital sores, muscle weakness, suspicious skin lesions, transient blindness, difficulty walking, depression, unusual weight change, abnormal bleeding, enlarged lymph nodes, angioedema, and breast masses.    Past Medical History:  Diagnosis Date  . Allergy   . Anemia   . Anxiety   . Asthma    DR. BYRUM  . Blood transfusion without reported diagnosis   . Cholelithiasis   . Chronic kidney disease    gallstones   . Compression fracture of  body of thoracic vertebra (HCC)   . Depression   . Dyspnea   . Dysrhythmia    palpitations  . GERD (gastroesophageal reflux disease)   . Heart attack ([place])   . Hemorrhoids   . IBS (irritable bowel syndrome)   . Iliotibial band syndrome    left knee  . Migraine   . Mitochondrial myopathy   . Normal coronary arteries    by cardiac catheterization performed by myself 02/25/01  . Osteoporosis   . Pericarditis    age 48  . Pneumothorax   . 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 PVC's (premature ventricular contractions)   . Vitamin D deficiency    Past Surgical History:  Procedure Laterality Date  . APPENDECTOMY    . CARDIAC CATHETERIZATION    . CATARACT EXTRACTION W/ INTRAOCULAR LENS  IMPLANT, BILATERAL    . CESAREAN SECTION     x 4  . COLONOSCOPY    . DILATATION & CURETTAGE/HYSTEROSCOPY WITH MYOSURE N/A 10/29/2014   Procedure: DILATATION & CURETTAGE/HYSTEROSCOPY WITH MYOSURE;  Surgeon: Paula Compton, MD;  Location: [facility];  Service: Gynecology;  Laterality: N/A;  . IR FLUORO GUIDED NEEDLE PLC ASPIRATION/INJECTION LOC  06/10/2016  . IR FLUORO GUIDED NEEDLE PLC ASPIRATION/INJECTION LOC  06/10/2016  . KYPHOPLASTY N/A 05/14/2016   Procedure: T 11 KYPHOPLASTY;  Surgeon: Phylliss Bob, MD;  Location: [place];  Service: Orthopedics;  Laterality: N/A;  T 11 KYPHOPLASTY  . KYPHOPLASTY N/A 06/18/2016   Procedure: THORACIC 12 KYPHOPLASTY;  Surgeon: Phylliss Bob, MD;  Location: [place];  Service: Orthopedics;  Laterality: N/A;  THORACIC 12 KYPHOPLASTY; REQUEST 1 HOUR AND FLIP ROOM  . MOUTH SURGERY    . NASAL SINUS SURGERY    .  NM MYOCAR PERF WALL MOTION  04/27/2006   normal  . US ECHOCARDIOGRAPHY  12/25/2010   normal  . VEIN SURGERY     Social History:  reports that  has never smoked. she has never used smokeless tobacco. She reports that she drinks about 1.2 oz of alcohol per week. She reports that she does not use drugs. Home;  where does patient live--home, ALF, SNF? and with whom if at  home? Yes;  Can patient participate in ADLs?  Allergies  Allergen Reactions  . Dulera [Mometasone Furo-Formoterol Fum] Cough    Family History  Problem Relation Age of Onset  . Asthma Mother   . Arthritis Mother   . Depression Mother   . Cancer Father   . Emphysema Father   . Stroke Father   . Alcohol abuse Father   . Heart attack Father   . Colon cancer Neg Hx     (be sure to complete)  Prior to Admission medications   Medication Sig Start Date End Date Taking? Authorizing Provider  aspirin 81 MG chewable tablet Chew 81 mg by mouth daily.    Yes [provider]  Estradiol-Norethindrone Acet (LOPREEZA) 0.5-0.1 MG per tablet Take 1 tablet by mouth daily.   Yes [provider]  LORazepam (ATIVAN) 0.5 MG tablet TAKE 1 TABLET BY MOUTH EVERY NIGHT AT BEDTIME 05/01/14  Yes Leandrew Koyanagi, MD  pantoprazole (PROTONIX) 40 MG tablet take 1 tablet by mouth daily before meals 10/13/16  Yes [provider]  PRESCRIPTION MEDICATION Take 1 tablet by mouth as needed (diarrhea). Old anti-diarrheal prescription, unsure of name.   Yes [provider]  SYMBICORT 80-4.5 MCG/ACT inhaler Inhale 2 puffs into the lungs 2 (two) times daily. 09/15/16  Yes [provider]  Vitamin D, Ergocalciferol, (DRISDOL) 50000 units CAPS capsule Take 1 capsule (50,000 Units total) by mouth every 7 (seven) days. Sundays 07/08/16  Yes [name], MD  albuterol (PROVENTIL HFA;VENTOLIN HFA) 108 (90 Base) MCG/ACT inhaler Inhale 2 puffs into the lungs every 4 (four) hours as needed for wheezing or shortness of breath. 01/15/16   Collene Gobble, MD  [facility] injection Inject 0.5 mLs into the muscle once. 01/21/17   [provider]   Physical Exam: Vitals:   02/28/17 1225  BP: 137/82  Pulse: 97  Resp: 18  Temp: 98.1 F (36.7 C)  SpO2: 97%     General:  Alert. No distress  Eyes: eim-I, perrla. Right eye ecchymosis    ENT: no oral ulcers   Neck: supple,  no JVD  Cardiovascular: s1,s2 rrr  Respiratory: CTA BL  Abdomen: soft, nt, nd   Skin: no rash   Musculoskeletal: no leg edema   Psychiatric: no hallucinations   Neurologic: CN 2-12 intact. Motor 5/5 BL. Sensation is intact   Labs on Admission:  Basic Metabolic Panel: No results for input(s): NA, K, CL, CO2, GLUCOSE, BUN, CREATININE, CALCIUM, MG, PHOS in the last 168 hours. Liver Function Tests: No results for input(s): AST, ALT, ALKPHOS, BILITOT, PROT, ALBUMIN in the last 168 hours. No results for input(s): LIPASE, AMYLASE in the last 168 hours. No results for input(s): AMMONIA in the last 168 hours. CBC: No results for input(s): WBC, NEUTROABS, HGB, HCT, MCV, PLT in the last 168 hours. Cardiac Enzymes: No results for input(s): CKTOTAL, CKMB, CKMBINDEX, TROPONINI in the last 168 hours.  BNP (last 3 results) No results for input(s): BNP in the last 8760 hours.  ProBNP (last 3 results)  No results for input(s): PROBNP in the last 8760 hours.  CBG: No results for input(s): GLUCAP in the last 168 hours.  Radiological Exams on Admission: Dg Chest 2 View  Result Date: 02/28/2017 CLINICAL DATA:  Left chest pain following a fall yesterday. EXAM: CHEST  2 VIEW COMPARISON:  05/14/2016. FINDINGS: Borderline enlarged cardiac silhouette. Clear lungs. To lower thoracic vertebral compression deformities with interval kyphoplasty material. Stable L2 vertebral compression deformity. Old, healed right humeral neck fracture without significant change. IMPRESSION: No acute abnormality. Electronically Signed   By: Claudie Revering M.D.   On: 02/28/2017 13:38   Ct Head Wo Contrast  Result Date: 02/28/2017 CLINICAL DATA:  Unwitnessed fall last night. Patient complains of headache. Significant bruising to the right eye and face. EXAM: CT HEAD WITHOUT CONTRAST CT MAXILLOFACIAL WITHOUT CONTRAST CT CERVICAL SPINE WITHOUT CONTRAST TECHNIQUE: Multidetector CT imaging of the head, cervical spine, and  maxillofacial structures were performed using the standard protocol without intravenous contrast. Multiplanar CT image reconstructions of the cervical spine and maxillofacial structures were also generated. COMPARISON:  None. FINDINGS: CT HEAD FINDINGS Brain: There is bilateral cerebral subarachnoid hemorrhage. It could be seen in the right temporal, right occipital, right frontal, left frontal, left occipital lobes. Minute foci of intraparenchymal hemorrhage in the convexity of the right frontal lobe. There is a thin right frontoparietal subdural hematoma, measuring 5 mm in maximum thickness frontally. No evidence of acute infarction. No evidence of significant mass effect or midline shift. Moderate brain parenchymal volume loss and mild white matter microangiopathy. Vascular: No hyperdense vessel or unexpected calcification. Skull: Normal. Negative for fracture or focal lesion. Other: Right frontal scalp hematoma. CT MAXILLOFACIAL FINDINGS Osseous: No fracture or mandibular dislocation. No destructive process. Orbits: Negative. No traumatic or inflammatory finding. Sinuses: Partial opacification of the left sphenoid sinus. Soft tissues: Right preseptal periorbital soft tissue hematoma with extension to the right face. CT CERVICAL SPINE FINDINGS Alignment: Straightening of cervical lordosis. Skull base and vertebrae: No acute fracture. No primary bone lesion or focal pathologic process. Soft tissues and spinal canal: No prevertebral fluid or swelling. No visible canal hematoma. Disc levels: 3 mild osteoarthritic changes at C5-C6. Moderate multilevel posterior facet arthropathy. Upper chest: Negative. Other: None. IMPRESSION: Bilateral cerebral subarachnoid hemorrhage, most prevalent in the right temporal lobe. Thin right frontoparietal subdural hematoma, measuring 4.5 mm in maximum thickness frontally. Minute foci of intraparenchymal hemorrhage in the right frontal lobe. No evidence of significant mass effect. No  evidence of skull or facial fractures. Right frontal, periorbital and facial hematoma. No evidence of acute traumatic injury to the cervical spine. These results were called by telephone at the time of interpretation on 02/28/2017 at 2:04 pm to PA [facility] , who verbally acknowledged these results. Electronically Signed   By: Fidela Salisbury M.D.   On: 02/28/2017 14:24   Ct Cervical Spine Wo Contrast  Result Date: 02/28/2017 CLINICAL DATA:  Unwitnessed fall last night. Patient complains of headache. Significant bruising to the right eye and face. EXAM: CT HEAD WITHOUT CONTRAST CT MAXILLOFACIAL WITHOUT CONTRAST CT CERVICAL SPINE WITHOUT CONTRAST TECHNIQUE: Multidetector CT imaging of the head, cervical spine, and maxillofacial structures were performed using the standard protocol without intravenous contrast. Multiplanar CT image reconstructions of the cervical spine and maxillofacial structures were also generated. COMPARISON:  None. FINDINGS: CT HEAD FINDINGS Brain: There is bilateral cerebral subarachnoid hemorrhage. It could be seen in the right temporal, right occipital, right frontal, left frontal, left occipital lobes. Minute foci of intraparenchymal hemorrhage  in the convexity of the right frontal lobe. There is a thin right frontoparietal subdural hematoma, measuring 5 mm in maximum thickness frontally. No evidence of acute infarction. No evidence of significant mass effect or midline shift. Moderate brain parenchymal volume loss and mild white matter microangiopathy. Vascular: No hyperdense vessel or unexpected calcification. Skull: Normal. Negative for fracture or focal lesion. Other: Right frontal scalp hematoma. CT MAXILLOFACIAL FINDINGS Osseous: No fracture or mandibular dislocation. No destructive process. Orbits: Negative. No traumatic or inflammatory finding. Sinuses: Partial opacification of the left sphenoid sinus. Soft tissues: Right preseptal periorbital soft tissue hematoma with  extension to the right face. CT CERVICAL SPINE FINDINGS Alignment: Straightening of cervical lordosis. Skull base and vertebrae: No acute fracture. No primary bone lesion or focal pathologic process. Soft tissues and spinal canal: No prevertebral fluid or swelling. No visible canal hematoma. Disc levels: 3 mild osteoarthritic changes at C5-C6. Moderate multilevel posterior facet arthropathy. Upper chest: Negative. Other: None. IMPRESSION: Bilateral cerebral subarachnoid hemorrhage, most prevalent in the right temporal lobe. Thin right frontoparietal subdural hematoma, measuring 4.5 mm in maximum thickness frontally. Minute foci of intraparenchymal hemorrhage in the right frontal lobe. No evidence of significant mass effect. No evidence of skull or facial fractures. Right frontal, periorbital and facial hematoma. No evidence of acute traumatic injury to the cervical spine. These results were called by telephone at the time of interpretation on 02/28/2017 at 2:04 pm to PA [facility] , who verbally acknowledged these results. Electronically Signed   By: Fidela Salisbury M.D.   On: 02/28/2017 14:24   Ct Maxillofacial Wo Cm  Result Date: 02/28/2017 CLINICAL DATA:  Unwitnessed fall last night. Patient complains of headache. Significant bruising to the right eye and face. EXAM: CT HEAD WITHOUT CONTRAST CT MAXILLOFACIAL WITHOUT CONTRAST CT CERVICAL SPINE WITHOUT CONTRAST TECHNIQUE: Multidetector CT imaging of the head, cervical spine, and maxillofacial structures were performed using the standard protocol without intravenous contrast. Multiplanar CT image reconstructions of the cervical spine and maxillofacial structures were also generated. COMPARISON:  None. FINDINGS: CT HEAD FINDINGS Brain: There is bilateral cerebral subarachnoid hemorrhage. It could be seen in the right temporal, right occipital, right frontal, left frontal, left occipital lobes. Minute foci of intraparenchymal hemorrhage in the convexity of  the right frontal lobe. There is a thin right frontoparietal subdural hematoma, measuring 5 mm in maximum thickness frontally. No evidence of acute infarction. No evidence of significant mass effect or midline shift. Moderate brain parenchymal volume loss and mild white matter microangiopathy. Vascular: No hyperdense vessel or unexpected calcification. Skull: Normal. Negative for fracture or focal lesion. Other: Right frontal scalp hematoma. CT MAXILLOFACIAL FINDINGS Osseous: No fracture or mandibular dislocation. No destructive process. Orbits: Negative. No traumatic or inflammatory finding. Sinuses: Partial opacification of the left sphenoid sinus. Soft tissues: Right preseptal periorbital soft tissue hematoma with extension to the right face. CT CERVICAL SPINE FINDINGS Alignment: Straightening of cervical lordosis. Skull base and vertebrae: No acute fracture. No primary bone lesion or focal pathologic process. Soft tissues and spinal canal: No prevertebral fluid or swelling. No visible canal hematoma. Disc levels: 3 mild osteoarthritic changes at C5-C6. Moderate multilevel posterior facet arthropathy. Upper chest: Negative. Other: None. IMPRESSION: Bilateral cerebral subarachnoid hemorrhage, most prevalent in the right temporal lobe. Thin right frontoparietal subdural hematoma, measuring 4.5 mm in maximum thickness frontally. Minute foci of intraparenchymal hemorrhage in the right frontal lobe. No evidence of significant mass effect. No evidence of skull or facial fractures. Right frontal, periorbital and facial hematoma.  No evidence of acute traumatic injury to the cervical spine. These results were called by telephone at the time of interpretation on 02/28/2017 at 2:04 pm to PA [facility] , who verbally acknowledged these results. Electronically Signed   By: Fidela Salisbury M.D.   On: 02/28/2017 14:24    EKG: Independently reviewed.   Assessment/Plan Active Problems:   SDH (subdural hematoma)  ([place])   72 y.o. female with PMH of Familial tremors syndrome, Mitochondrial myopahty with recurrent falls, alcohol use, asthma presented with fall found to have brain bleed   Fall. Head trauma: with SDH, SAH, ICH. CT head: Bilateral cerebral subarachnoid hemorrhage, most prevalent in the right temporal lobe. Thin right frontoparietal subdural hematoma, measuring 4.5 mm in maximum thickness frontally.  Minute foci of intraparenchymal hemorrhage in the right frontal lobe.No evidence of significant mass effect.  No evidence of skull or facial fractures. Right frontal, periorbital and facial hematoma. No evidence of acute traumatic injury to the cervical spine. -neuro exam is non focal. Patient is in no distress. ED d/w neurosurgery who recommended observation and repeat CT AM. Call/re consult if change in status or worsening head/ct changes  -hold aspirin. Labs are pend   Chronic Gait instability due to mitochondrial myopathy, familial tremors, recurrent falls. Last night fall,possible related to etoh use. Will obtain pt/to   Labs are pend at this time.   Patient will be TF to [facility] for close monitoring. Updated Dr. Cathlean Sauer, hospitalist at [facility].  if consultant consulted, please document name and whether formally or informally consulted  Code Status: full (must indicate code status--if unknown or must be presumed, indicate so) Family Communication: d/w patient, her family. ED (indicate person spoken with, if applicable, with phone number if by telephone) Disposition Plan: home pend improvement.  (indicate anticipated LOS)  Time spent: >45 minutes   Kinnie Feil [facility] Pager [phone]  If 7PM-7AM, please contact night-coverage [email or web address] Password [facility] 02/28/2017, 3:18 PM

## 2017-02-28 NOTE — ED Notes (Signed)
ED TO INPATIENT HANDOFF REPORT  Name/Age/Gender [name] 72 y.o. female  Code Status    Code Status Orders  (From admission, onward)        Start     Ordered   02/28/17 1538  Full code  Continuous     02/28/17 1538    Code Status History    Date Active Date Inactive Code Status Order ID Comments User Context   This patient has a current code status but no historical code status.      Home/SNF/Other Home  Chief Complaint Fall, Right eye bruising/ rib pain  Level of Care/Admitting Diagnosis ED Disposition    ED Disposition Condition [facility] Area: [place] [[id number]]  Level of Care: Telemetry [5]  Diagnosis: SDH (subdural hematoma) ([place]) [193790]  Admitting Physician: Kinnie Feil [[id number]]  Attending Physician: Kinnie Feil [[id number]]  PT Class (Do Not Modify): Observation [104]  PT Acc Code (Do Not Modify): Observation [10022]       Medical History Past Medical History:  Diagnosis Date  . Allergy   . Anemia   . Anxiety   . Asthma    DR. BYRUM  . Blood transfusion without reported diagnosis   . Cholelithiasis   . Chronic kidney disease    gallstones   . Compression fracture of body of thoracic vertebra (HCC)   . Depression   . Dyspnea   . Dysrhythmia    palpitations  . GERD (gastroesophageal reflux disease)   . Heart attack ([place])   . Hemorrhoids   . IBS (irritable bowel syndrome)   . Iliotibial band syndrome    left knee  . Migraine   . Mitochondrial myopathy   . Normal coronary arteries    by cardiac catheterization performed by myself 02/25/01  . Osteoporosis   . Pericarditis    age 72  . Pneumothorax   . 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 PVC's (premature ventricular contractions)   . Vitamin D deficiency     Allergies Allergies  Allergen Reactions  . Dulera [Mometasone Furo-Formoterol Fum] Cough    IV Location/Drains/Wounds Patient Lines/Drains/Airways Status   Active  Line/Drains/Airways    Name:   Placement date:   Placement time:   Site:   Days:   Peripheral IV 10/29/14 Left Wrist   10/29/14    0625    Wrist   853   Peripheral IV 04/27/16 Left;Medial;Anterior Forearm   04/27/16    1530    Forearm   307   Incision (Closed) 10/29/14 Perineum Other (Comment)   10/29/14    0739     853   Incision (Closed) 05/14/16 Back   05/14/16    1417     290   Incision (Closed) 06/10/16 Back Mid   06/10/16    1350     263   Incision (Closed) 06/18/16 Back Other (Comment)   06/18/16    1235     255          Labs/Imaging Results for orders placed or performed during the hospital encounter of 02/28/17 (from the past 48 hour(s))  CBC with Differential     Status: Abnormal   Collection Time: 02/28/17  3:25 PM  Result Value Ref Range   WBC 8.9 4.0 - 10.5 K/uL   RBC 4.15 3.87 - 5.11 MIL/uL   Hemoglobin 14.7 12.0 - 15.0 g/dL   HCT 42.4 36.0 - 46.0 %   MCV 102.2 (H) 78.0 -  100.0 fL   MCH 35.4 (H) 26.0 - 34.0 pg   MCHC 34.7 30.0 - 36.0 g/dL   RDW 12.5 11.5 - 15.5 %   Platelets 132 (L) 150 - 400 K/uL   Neutrophils Relative % 76 %   Neutro Abs 6.8 1.7 - 7.7 K/uL   Lymphocytes Relative 12 %   Lymphs Abs 1.1 0.7 - 4.0 K/uL   Monocytes Relative 11 %   Monocytes Absolute 1.0 0.1 - 1.0 K/uL   Eosinophils Relative 1 %   Eosinophils Absolute 0.0 0.0 - 0.7 K/uL   Basophils Relative 0 %   Basophils Absolute 0.0 0.0 - 0.1 K/uL    Comment: Performed at [facility], [place] [address], [place], [place] [address]  Basic metabolic panel     Status: Abnormal   Collection Time: 02/28/17  3:25 PM  Result Value Ref Range   Sodium 138 135 - 145 mmol/L   Potassium 3.7 3.5 - 5.1 mmol/L   Chloride 107 101 - 111 mmol/L   CO2 24 22 - 32 mmol/L   Glucose, Bld 102 (H) 65 - 99 mg/dL   BUN 12 6 - 20 mg/dL   Creatinine, Ser 0.49 0.44 - 1.00 mg/dL   Calcium 8.7 (L) 8.9 - 10.3 mg/dL   GFR calc non Af Amer >60 >60 mL/min   GFR calc Af Amer >60 >60 mL/min    Comment:  (NOTE) The eGFR has been calculated using the CKD EPI equation. This calculation has not been validated in all clinical situations. eGFR's persistently <60 mL/min signify possible Chronic Kidney Disease.    Anion gap 7 5 - 15    Comment: Performed at [facility], [place] [address] [place], [place] [address]   Dg Chest 2 View  Result Date: 02/28/2017 CLINICAL DATA:  Left chest pain following a fall yesterday. EXAM: CHEST  2 VIEW COMPARISON:  05/14/2016. FINDINGS: Borderline enlarged cardiac silhouette. Clear lungs. To lower thoracic vertebral compression deformities with interval kyphoplasty material. Stable L2 vertebral compression deformity. Old, healed right humeral neck fracture without significant change. IMPRESSION: No acute abnormality. Electronically Signed   By: Claudie Revering M.D.   On: 02/28/2017 13:38   Ct Head Wo Contrast  Result Date: 02/28/2017 CLINICAL DATA:  Unwitnessed fall last night. Patient complains of headache. Significant bruising to the right eye and face. EXAM: CT HEAD WITHOUT CONTRAST CT MAXILLOFACIAL WITHOUT CONTRAST CT CERVICAL SPINE WITHOUT CONTRAST TECHNIQUE: Multidetector CT imaging of the head, cervical spine, and maxillofacial structures were performed using the standard protocol without intravenous contrast. Multiplanar CT image reconstructions of the cervical spine and maxillofacial structures were also generated. COMPARISON:  None. FINDINGS: CT HEAD FINDINGS Brain: There is bilateral cerebral subarachnoid hemorrhage. It could be seen in the right temporal, right occipital, right frontal, left frontal, left occipital lobes. Minute foci of intraparenchymal hemorrhage in the convexity of the right frontal lobe. There is a thin right frontoparietal subdural hematoma, measuring 5 mm in maximum thickness frontally. No evidence of acute infarction. No evidence of significant mass effect or midline shift. Moderate brain parenchymal volume loss and mild white  matter microangiopathy. Vascular: No hyperdense vessel or unexpected calcification. Skull: Normal. Negative for fracture or focal lesion. Other: Right frontal scalp hematoma. CT MAXILLOFACIAL FINDINGS Osseous: No fracture or mandibular dislocation. No destructive process. Orbits: Negative. No traumatic or inflammatory finding. Sinuses: Partial opacification of the left sphenoid sinus. Soft tissues: Right preseptal periorbital soft tissue hematoma with extension to the right face.  CT CERVICAL SPINE FINDINGS Alignment: Straightening of cervical lordosis. Skull base and vertebrae: No acute fracture. No primary bone lesion or focal pathologic process. Soft tissues and spinal canal: No prevertebral fluid or swelling. No visible canal hematoma. Disc levels: 3 mild osteoarthritic changes at C5-C6. Moderate multilevel posterior facet arthropathy. Upper chest: Negative. Other: None. IMPRESSION: Bilateral cerebral subarachnoid hemorrhage, most prevalent in the right temporal lobe. Thin right frontoparietal subdural hematoma, measuring 4.5 mm in maximum thickness frontally. Minute foci of intraparenchymal hemorrhage in the right frontal lobe. No evidence of significant mass effect. No evidence of skull or facial fractures. Right frontal, periorbital and facial hematoma. No evidence of acute traumatic injury to the cervical spine. These results were called by telephone at the time of interpretation on 02/28/2017 at 2:04 pm to PA [facility] , who verbally acknowledged these results. Electronically Signed   By: Fidela Salisbury M.D.   On: 02/28/2017 14:24   Ct Cervical Spine Wo Contrast  Result Date: 02/28/2017 CLINICAL DATA:  Unwitnessed fall last night. Patient complains of headache. Significant bruising to the right eye and face. EXAM: CT HEAD WITHOUT CONTRAST CT MAXILLOFACIAL WITHOUT CONTRAST CT CERVICAL SPINE WITHOUT CONTRAST TECHNIQUE: Multidetector CT imaging of the head, cervical spine, and maxillofacial  structures were performed using the standard protocol without intravenous contrast. Multiplanar CT image reconstructions of the cervical spine and maxillofacial structures were also generated. COMPARISON:  None. FINDINGS: CT HEAD FINDINGS Brain: There is bilateral cerebral subarachnoid hemorrhage. It could be seen in the right temporal, right occipital, right frontal, left frontal, left occipital lobes. Minute foci of intraparenchymal hemorrhage in the convexity of the right frontal lobe. There is a thin right frontoparietal subdural hematoma, measuring 5 mm in maximum thickness frontally. No evidence of acute infarction. No evidence of significant mass effect or midline shift. Moderate brain parenchymal volume loss and mild white matter microangiopathy. Vascular: No hyperdense vessel or unexpected calcification. Skull: Normal. Negative for fracture or focal lesion. Other: Right frontal scalp hematoma. CT MAXILLOFACIAL FINDINGS Osseous: No fracture or mandibular dislocation. No destructive process. Orbits: Negative. No traumatic or inflammatory finding. Sinuses: Partial opacification of the left sphenoid sinus. Soft tissues: Right preseptal periorbital soft tissue hematoma with extension to the right face. CT CERVICAL SPINE FINDINGS Alignment: Straightening of cervical lordosis. Skull base and vertebrae: No acute fracture. No primary bone lesion or focal pathologic process. Soft tissues and spinal canal: No prevertebral fluid or swelling. No visible canal hematoma. Disc levels: 3 mild osteoarthritic changes at C5-C6. Moderate multilevel posterior facet arthropathy. Upper chest: Negative. Other: None. IMPRESSION: Bilateral cerebral subarachnoid hemorrhage, most prevalent in the right temporal lobe. Thin right frontoparietal subdural hematoma, measuring 4.5 mm in maximum thickness frontally. Minute foci of intraparenchymal hemorrhage in the right frontal lobe. No evidence of significant mass effect. No evidence of  skull or facial fractures. Right frontal, periorbital and facial hematoma. No evidence of acute traumatic injury to the cervical spine. These results were called by telephone at the time of interpretation on 02/28/2017 at 2:04 pm to PA [facility] , who verbally acknowledged these results. Electronically Signed   By: Fidela Salisbury M.D.   On: 02/28/2017 14:24   Ct Maxillofacial Wo Cm  Result Date: 02/28/2017 CLINICAL DATA:  Unwitnessed fall last night. Patient complains of headache. Significant bruising to the right eye and face. EXAM: CT HEAD WITHOUT CONTRAST CT MAXILLOFACIAL WITHOUT CONTRAST CT CERVICAL SPINE WITHOUT CONTRAST TECHNIQUE: Multidetector CT imaging of the head, cervical spine, and maxillofacial structures were performed  using the standard protocol without intravenous contrast. Multiplanar CT image reconstructions of the cervical spine and maxillofacial structures were also generated. COMPARISON:  None. FINDINGS: CT HEAD FINDINGS Brain: There is bilateral cerebral subarachnoid hemorrhage. It could be seen in the right temporal, right occipital, right frontal, left frontal, left occipital lobes. Minute foci of intraparenchymal hemorrhage in the convexity of the right frontal lobe. There is a thin right frontoparietal subdural hematoma, measuring 5 mm in maximum thickness frontally. No evidence of acute infarction. No evidence of significant mass effect or midline shift. Moderate brain parenchymal volume loss and mild white matter microangiopathy. Vascular: No hyperdense vessel or unexpected calcification. Skull: Normal. Negative for fracture or focal lesion. Other: Right frontal scalp hematoma. CT MAXILLOFACIAL FINDINGS Osseous: No fracture or mandibular dislocation. No destructive process. Orbits: Negative. No traumatic or inflammatory finding. Sinuses: Partial opacification of the left sphenoid sinus. Soft tissues: Right preseptal periorbital soft tissue hematoma with extension to the right  face. CT CERVICAL SPINE FINDINGS Alignment: Straightening of cervical lordosis. Skull base and vertebrae: No acute fracture. No primary bone lesion or focal pathologic process. Soft tissues and spinal canal: No prevertebral fluid or swelling. No visible canal hematoma. Disc levels: 3 mild osteoarthritic changes at C5-C6. Moderate multilevel posterior facet arthropathy. Upper chest: Negative. Other: None. IMPRESSION: Bilateral cerebral subarachnoid hemorrhage, most prevalent in the right temporal lobe. Thin right frontoparietal subdural hematoma, measuring 4.5 mm in maximum thickness frontally. Minute foci of intraparenchymal hemorrhage in the right frontal lobe. No evidence of significant mass effect. No evidence of skull or facial fractures. Right frontal, periorbital and facial hematoma. No evidence of acute traumatic injury to the cervical spine. These results were called by telephone at the time of interpretation on 02/28/2017 at 2:04 pm to PA [facility] , who verbally acknowledged these results. Electronically Signed   By: Fidela Salisbury M.D.   On: 02/28/2017 14:24    Pending Labs Unresulted Labs (From admission, onward)   None      Vitals/Pain Today's Vitals   02/28/17 1240 02/28/17 1250 02/28/17 1656 02/28/17 1702  BP:    135/63  Pulse:    88  Resp:    18  Temp:    98.1 F (36.7 C)  TempSrc:    Oral  SpO2:    98%  Weight: 140 lb (63.5 kg)     Height: _0  (1.6 m)     PainSc: _1 Isolation Precautions No active isolations  Medications Medications  albuterol (PROVENTIL HFA;VENTOLIN HFA) 108 (90 Base) MCG/ACT inhaler 2 puff (not administered)  Estradiol-Norethindrone Acet 0.5-0.1 MG 1 tablet (not administered)  LORazepam (ATIVAN) tablet 0.5 mg (not administered)  pantoprazole (PROTONIX) EC tablet 40 mg (not administered)  mometasone-formoterol (DULERA) 100-5 MCG/ACT inhaler 2 puff (not administered)  Vitamin D (Ergocalciferol) (DRISDOL) capsule 50,000 Units (not  administered)  acetaminophen (TYLENOL) tablet 650 mg (650 mg Oral Given 02/28/17 1656)    Mobility walks

## 2017-02-28 NOTE — ED Notes (Signed)
[facility] was notified of pt's need of transportation to [facility] ([id number]).

## 2017-02-28 NOTE — ED Triage Notes (Addendum)
Unwitnessed fall last night, pt has headache, significant bruising to rt eye and face, rib pain and knee pain. Unsure LOC

## 2017-02-28 NOTE — ED Notes (Signed)
[facility] here to transfer pt to [facility]. 

## 2017-03-01 ENCOUNTER — Observation Stay (HOSPITAL_COMMUNITY): Payer: Medicare Other

## 2017-03-01 DIAGNOSIS — S066X9A Traumatic subarachnoid hemorrhage with loss of consciousness of unspecified duration, initial encounter: Secondary | ICD-10-CM | POA: Diagnosis not present

## 2017-03-01 DIAGNOSIS — S065X9A Traumatic subdural hemorrhage with loss of consciousness of unspecified duration, initial encounter: Secondary | ICD-10-CM

## 2017-03-01 DIAGNOSIS — I609 Nontraumatic subarachnoid hemorrhage, unspecified: Secondary | ICD-10-CM

## 2017-03-01 DIAGNOSIS — S065X0A Traumatic subdural hemorrhage without loss of consciousness, initial encounter: Secondary | ICD-10-CM | POA: Diagnosis not present

## 2017-03-01 LAB — MAGNESIUM: Magnesium: 1.7 mg/dL (ref 1.7–2.4)

## 2017-03-01 NOTE — Discharge Summary (Signed)
Physician Discharge Summary  [name] DJS:[id number] DOB: Jun 07, 1945 DOA: 02/28/2017  PCP: Velna Hatchet, MD  Admit date: 02/28/2017 Discharge date: 03/01/2017   Recommendations for Outpatient Follow-Up:   1. ASA holding until NS follow up 2. Consider outpatient holter monitor 3. Incentive spirometry 4.    Discharge Diagnosis:   Active Problems:   SDH (subdural hematoma) [facility])   Discharge disposition:  Home  Discharge Condition: Improved.  Diet recommendation:  Regular.  Wound care: None.   History of Present Illness:   [name] is a 72 y.o. female with PMH of Familial tremors syndrome, Mitochondrial myopahty with recurrent falls, alcohol use, asthma presented with fall. Patient states that they have had some guest last  Night. She reports chronic gait instability due to mitochondrial myopathy. She felt tired last night and went to second floor oh her house. Husband reports that heard the thump. He went to see her wife and found her on the floor unconscious for few seconds then returned back to normal. He reports that patient had fallen and landed against bookcase. No seizure like activity, no urinary or bowel incontinence, no tongue biting. Patient denies any focal weakness or paresthesias prior or after the fall. No vertigo, no palpations, no acute cardiopulmonary symptoms. She reports drinking some alcohol last night with her guests.  -ED: CT head: multiple bleeds. SDH, SAH, small intracerebral bleed. Labs pend. ED d/w neurosurgery, who recommended observation, repeat CT AM     Hospital Course by Problem:   Fall. Head trauma: with SDH, SAH, ICH. CT head: Bilateral cerebral subarachnoid hemorrhage, most prevalent in the right temporal lobe. Thin right frontoparietal subdural hematoma, measuring 4.5 mm in maximum thickness frontally.  Minute foci of intraparenchymal hemorrhage in the right frontal lobe.No evidence of significant mass effect. -neuro exam  is non focal. Patient is in no distress -repeat head CT stable -hold aspirin until outpatient follow up  Chronic Gait instability due to mitochondrial myopathy, familial tremors, recurrent falls. Last night fall,possible related to etoh use. -seen by PT     Medical Consultants:    NS   Discharge Exam:   Vitals:   03/01/17 0646 03/01/17 1020  BP: 139/69 127/68  Pulse: 79 80  Resp: 17 20  Temp: 98.2 F (36.8 C) 98.6 F (37 C)  SpO2: 97% 97%   Vitals:   02/28/17 2253 02/28/17 2309 03/01/17 0646 03/01/17 1020  BP:  (!) 141/66 139/69 127/68  Pulse:  82 79 80  Resp:   17 20  Temp:  98 F (36.7 C) 98.2 F (36.8 C) 98.6 F (37 C)  TempSrc:  Oral Oral Oral  SpO2:  97% 97% 97%  Weight: 63.7 kg (140 lb 6.9 oz)     Height: 5\' 3"  (1.6 m)       Gen:  NAD   The results of significant diagnostics from this hospitalization (including imaging, microbiology, ancillary and laboratory) are listed below for reference.     Procedures and Diagnostic Studies:   Dg Chest 2 View  Result Date: 02/28/2017 CLINICAL DATA:  Left chest pain following a fall yesterday. EXAM: CHEST  2 VIEW COMPARISON:  05/14/2016. FINDINGS: Borderline enlarged cardiac silhouette. Clear lungs. To lower thoracic vertebral compression deformities with interval kyphoplasty material. Stable L2 vertebral compression deformity. Old, healed right humeral neck fracture without significant change. IMPRESSION: No acute abnormality. Electronically Signed   By: Claudie Revering M.D.   On: 02/28/2017 13:38   Ct Head Wo Contrast  Result Date:  03/01/2017 CLINICAL DATA:  Recent fall with subdural hematoma, follow-up examination EXAM: CT HEAD WITHOUT CONTRAST TECHNIQUE: Contiguous axial images were obtained from the base of the skull through the vertex without intravenous contrast. COMPARISON:  02/28/2017 FINDINGS: Brain: Bilateral subarachnoid hemorrhage is again identified involving the right temporal, right occipital and left  frontal parietal regions stable from the prior exam. No definitive parenchymal hemorrhage is seen. A small right frontal and para fall seen subdural hematoma is noted which again measures 6 mm along the inferior aspect and 4 mm more superiorly. This is overall stable from the prior exam. No new subdural hematoma is seen. No findings to suggest acute ischemia or space-occupying mass lesion are noted. Vascular: No hyperdense vessel or unexpected calcification. Skull: Normal. Negative for fracture or focal lesion. Sinuses/Orbits: No acute finding. Other: None. IMPRESSION: Stable appearing bilateral subarachnoid hemorrhage and subdural hematoma on the right. No new focal abnormality is seen. Electronically Signed   By: Inez Catalina M.D.   On: 03/01/2017 08:23   Ct Head Wo Contrast  Result Date: 02/28/2017 CLINICAL DATA:  Unwitnessed fall last night. Patient complains of headache. Significant bruising to the right eye and face. EXAM: CT HEAD WITHOUT CONTRAST CT MAXILLOFACIAL WITHOUT CONTRAST CT CERVICAL SPINE WITHOUT CONTRAST TECHNIQUE: Multidetector CT imaging of the head, cervical spine, and maxillofacial structures were performed using the standard protocol without intravenous contrast. Multiplanar CT image reconstructions of the cervical spine and maxillofacial structures were also generated. COMPARISON:  None. FINDINGS: CT HEAD FINDINGS Brain: There is bilateral cerebral subarachnoid hemorrhage. It could be seen in the right temporal, right occipital, right frontal, left frontal, left occipital lobes. Minute foci of intraparenchymal hemorrhage in the convexity of the right frontal lobe. There is a thin right frontoparietal subdural hematoma, measuring 5 mm in maximum thickness frontally. No evidence of acute infarction. No evidence of significant mass effect or midline shift. Moderate brain parenchymal volume loss and mild white matter microangiopathy. Vascular: No hyperdense vessel or unexpected calcification.  Skull: Normal. Negative for fracture or focal lesion. Other: Right frontal scalp hematoma. CT MAXILLOFACIAL FINDINGS Osseous: No fracture or mandibular dislocation. No destructive process. Orbits: Negative. No traumatic or inflammatory finding. Sinuses: Partial opacification of the left sphenoid sinus. Soft tissues: Right preseptal periorbital soft tissue hematoma with extension to the right face. CT CERVICAL SPINE FINDINGS Alignment: Straightening of cervical lordosis. Skull base and vertebrae: No acute fracture. No primary bone lesion or focal pathologic process. Soft tissues and spinal canal: No prevertebral fluid or swelling. No visible canal hematoma. Disc levels: 3 mild osteoarthritic changes at C5-C6. Moderate multilevel posterior facet arthropathy. Upper chest: Negative. Other: None. IMPRESSION: Bilateral cerebral subarachnoid hemorrhage, most prevalent in the right temporal lobe. Thin right frontoparietal subdural hematoma, measuring 4.5 mm in maximum thickness frontally. Minute foci of intraparenchymal hemorrhage in the right frontal lobe. No evidence of significant mass effect. No evidence of skull or facial fractures. Right frontal, periorbital and facial hematoma. No evidence of acute traumatic injury to the cervical spine. These results were called by telephone at the time of interpretation on 02/28/2017 at 2:04 pm to PA [facility] , who verbally acknowledged these results. Electronically Signed   By: Fidela Salisbury M.D.   On: 02/28/2017 14:24   Ct Cervical Spine Wo Contrast  Result Date: 02/28/2017 CLINICAL DATA:  Unwitnessed fall last night. Patient complains of headache. Significant bruising to the right eye and face. EXAM: CT HEAD WITHOUT CONTRAST CT MAXILLOFACIAL WITHOUT CONTRAST CT CERVICAL SPINE WITHOUT CONTRAST TECHNIQUE:  Multidetector CT imaging of the head, cervical spine, and maxillofacial structures were performed using the standard protocol without intravenous contrast.  Multiplanar CT image reconstructions of the cervical spine and maxillofacial structures were also generated. COMPARISON:  None. FINDINGS: CT HEAD FINDINGS Brain: There is bilateral cerebral subarachnoid hemorrhage. It could be seen in the right temporal, right occipital, right frontal, left frontal, left occipital lobes. Minute foci of intraparenchymal hemorrhage in the convexity of the right frontal lobe. There is a thin right frontoparietal subdural hematoma, measuring 5 mm in maximum thickness frontally. No evidence of acute infarction. No evidence of significant mass effect or midline shift. Moderate brain parenchymal volume loss and mild white matter microangiopathy. Vascular: No hyperdense vessel or unexpected calcification. Skull: Normal. Negative for fracture or focal lesion. Other: Right frontal scalp hematoma. CT MAXILLOFACIAL FINDINGS Osseous: No fracture or mandibular dislocation. No destructive process. Orbits: Negative. No traumatic or inflammatory finding. Sinuses: Partial opacification of the left sphenoid sinus. Soft tissues: Right preseptal periorbital soft tissue hematoma with extension to the right face. CT CERVICAL SPINE FINDINGS Alignment: Straightening of cervical lordosis. Skull base and vertebrae: No acute fracture. No primary bone lesion or focal pathologic process. Soft tissues and spinal canal: No prevertebral fluid or swelling. No visible canal hematoma. Disc levels: 3 mild osteoarthritic changes at C5-C6. Moderate multilevel posterior facet arthropathy. Upper chest: Negative. Other: None. IMPRESSION: Bilateral cerebral subarachnoid hemorrhage, most prevalent in the right temporal lobe. Thin right frontoparietal subdural hematoma, measuring 4.5 mm in maximum thickness frontally. Minute foci of intraparenchymal hemorrhage in the right frontal lobe. No evidence of significant mass effect. No evidence of skull or facial fractures. Right frontal, periorbital and facial hematoma. No evidence  of acute traumatic injury to the cervical spine. These results were called by telephone at the time of interpretation on 02/28/2017 at 2:04 pm to PA [facility] , who verbally acknowledged these results. Electronically Signed   By: Fidela Salisbury M.D.   On: 02/28/2017 14:24   Ct Maxillofacial Wo Cm  Result Date: 02/28/2017 CLINICAL DATA:  Unwitnessed fall last night. Patient complains of headache. Significant bruising to the right eye and face. EXAM: CT HEAD WITHOUT CONTRAST CT MAXILLOFACIAL WITHOUT CONTRAST CT CERVICAL SPINE WITHOUT CONTRAST TECHNIQUE: Multidetector CT imaging of the head, cervical spine, and maxillofacial structures were performed using the standard protocol without intravenous contrast. Multiplanar CT image reconstructions of the cervical spine and maxillofacial structures were also generated. COMPARISON:  None. FINDINGS: CT HEAD FINDINGS Brain: There is bilateral cerebral subarachnoid hemorrhage. It could be seen in the right temporal, right occipital, right frontal, left frontal, left occipital lobes. Minute foci of intraparenchymal hemorrhage in the convexity of the right frontal lobe. There is a thin right frontoparietal subdural hematoma, measuring 5 mm in maximum thickness frontally. No evidence of acute infarction. No evidence of significant mass effect or midline shift. Moderate brain parenchymal volume loss and mild white matter microangiopathy. Vascular: No hyperdense vessel or unexpected calcification. Skull: Normal. Negative for fracture or focal lesion. Other: Right frontal scalp hematoma. CT MAXILLOFACIAL FINDINGS Osseous: No fracture or mandibular dislocation. No destructive process. Orbits: Negative. No traumatic or inflammatory finding. Sinuses: Partial opacification of the left sphenoid sinus. Soft tissues: Right preseptal periorbital soft tissue hematoma with extension to the right face. CT CERVICAL SPINE FINDINGS Alignment: Straightening of cervical lordosis. Skull  base and vertebrae: No acute fracture. No primary bone lesion or focal pathologic process. Soft tissues and spinal canal: No prevertebral fluid or swelling. No visible canal hematoma.  Disc levels: 3 mild osteoarthritic changes at C5-C6. Moderate multilevel posterior facet arthropathy. Upper chest: Negative. Other: None. IMPRESSION: Bilateral cerebral subarachnoid hemorrhage, most prevalent in the right temporal lobe. Thin right frontoparietal subdural hematoma, measuring 4.5 mm in maximum thickness frontally. Minute foci of intraparenchymal hemorrhage in the right frontal lobe. No evidence of significant mass effect. No evidence of skull or facial fractures. Right frontal, periorbital and facial hematoma. No evidence of acute traumatic injury to the cervical spine. These results were called by telephone at the time of interpretation on 02/28/2017 at 2:04 pm to PA [facility] , who verbally acknowledged these results. Electronically Signed   By: Fidela Salisbury M.D.   On: 02/28/2017 14:24     Labs:   Basic Metabolic Panel: Recent Labs  Lab 02/28/17 1525  NA 138  K 3.7  CL 107  CO2 24  GLUCOSE 102*  BUN 12  CREATININE 0.49  CALCIUM 8.7*   GFR Estimated Creatinine Clearance: 56.3 mL/min (by C-G formula based on SCr of 0.49 mg/dL). Liver Function Tests: No results for input(s): AST, ALT, ALKPHOS, BILITOT, PROT, ALBUMIN in the last 168 hours. No results for input(s): LIPASE, AMYLASE in the last 168 hours. No results for input(s): AMMONIA in the last 168 hours. Coagulation profile No results for input(s): INR, PROTIME in the last 168 hours.  CBC: Recent Labs  Lab 02/28/17 1525  WBC 8.9  NEUTROABS 6.8  HGB 14.7  HCT 42.4  MCV 102.2*  PLT 132*   Cardiac Enzymes: No results for input(s): CKTOTAL, CKMB, CKMBINDEX, TROPONINI in the last 168 hours. BNP: Invalid input(s): POCBNP CBG: No results for input(s): GLUCAP in the last 168 hours. D-Dimer No results for input(s): DDIMER  in the last 72 hours. Hgb A1c No results for input(s): HGBA1C in the last 72 hours. Lipid Profile No results for input(s): CHOL, HDL, LDLCALC, TRIG, CHOLHDL, LDLDIRECT in the last 72 hours. Thyroid function studies No results for input(s): TSH, T4TOTAL, T3FREE, THYROIDAB in the last 72 hours.  Invalid input(s): FREET3 Anemia work up No results for input(s): VITAMINB12, FOLATE, FERRITIN, TIBC, IRON, RETICCTPCT in the last 72 hours. Microbiology No results found for this or any previous visit (from the past 240 hour(s)).   Discharge Instructions:   Discharge Instructions    Diet general   Complete by:  As directed    Discharge instructions   Complete by:  As directed    Hold ASA until repeat CT scan done in 3 weeks-- I have discussed with Neurosurgery as well as Dr. Elnoria Howard.   Be cautious with alcohol use as well as ativan use Consider weighted blanket Incentive spirometry 4-5x per hour when awake to avoid Pneumonia from rib pain 24/7 Supervision for several days post hospital discharge per PT recommendations   Increase activity slowly   Complete by:  As directed      Allergies as of 03/01/2017      Reactions   Dulera [mometasone Furo-formoterol Fum] Cough      Medication List    STOP taking these medications   aspirin 81 MG chewable tablet   ibuprofen 200 MG tablet Commonly known as:  ADVIL,MOTRIN   SHINGRIX injection Generic drug:  Zoster Vaccine Adjuvanted     TAKE these medications   albuterol 108 (90 Base) MCG/ACT inhaler Commonly known as:  PROVENTIL HFA;VENTOLIN HFA Inhale 2 puffs into the lungs every 4 (four) hours as needed for wheezing or shortness of breath.   LOPREEZA 0.5-0.1 MG tablet Generic drug:  Estradiol-Norethindrone Acet Take 1 tablet by mouth daily.   LORazepam 0.5 MG tablet Commonly known as:  ATIVAN TAKE 1 TABLET BY MOUTH EVERY NIGHT AT BEDTIME   pantoprazole 40 MG tablet Commonly known as:  PROTONIX take 1 tablet by mouth daily before  meals   PRESCRIPTION MEDICATION Take 1 tablet by mouth as needed (diarrhea). Old anti-diarrheal prescription, unsure of name.   SYMBICORT 80-4.5 MCG/ACT inhaler Generic drug:  budesonide-formoterol Inhale 2 puffs into the lungs 2 (two) times daily.   Vitamin D (Ergocalciferol) 50000 units Caps capsule Commonly known as:  DRISDOL Take 1 capsule (50,000 Units total) by mouth every 7 (seven) days. Sundays         Time coordinating discharge: 35 min  Signed:  Geradine Girt   [facility] 03/01/2017, 10:45 AM

## 2017-03-01 NOTE — Care Management Note (Signed)
Case Management Note  Patient Details  Name: [name] MRN: [id number] Date of Birth: 1945/03/29  Subjective/Objective:    Pt in with SDH. She is from home with her spouse.             PCP;: Dr Ardeth Perfect Insurance: Medicare  Action/Plan: No f/u per PT. Recommendations are for 24/7 supervision. Pt states her spouse can provide the supervision.  Husband also to provide transportation home.  Expected Discharge Date:  03/01/17               Expected Discharge Plan:  Home/Self Care  In-House Referral:     Discharge planning Services     Post Acute Care Choice:    Choice offered to:     DME Arranged:    DME Agency:     HH Arranged:    HH Agency:     Status of Service:  Completed, signed off  If discussed at [employer] of Stay Meetings, dates discussed:    Additional Comments:  Pollie Friar, RN 03/01/2017, 11:30 AM

## 2017-03-01 NOTE — Progress Notes (Signed)
Pt discharged to home from hospital per orders from MD. Pt educated on discharge instructions. Pt verbalized understanding of all instructions. All pt questions answered. IV removed prior to discharge. Gauze dressing applied. Pt left unit via wheelchair accompanied by staff.

## 2017-03-01 NOTE — Care Management Obs Status (Signed)
[facility] NOTIFICATION   Patient Details  Name: [name] MRN: [id number] Date of Birth: 30-Jul-1945   Medicare Observation Status Notification Given:  Yes    Pollie Friar, RN 03/01/2017, 11:30 AM

## 2017-03-01 NOTE — Consult Note (Signed)
Chief Complaint   Chief Complaint  Patient presents with  . Fall  . Headache    Agree with the assessment and plan of the following note.  No need for neurosurgical follow-up.   HPI: [name] is a 72 y.o. female who presented to ER Sunday afternoon due to post traumatic headache. Fell day prior (Saturday evening) striking head/right side of face with + LOC. Found by husband on ground unconscious. No seizure like activity witnessed. CT head performed in ER. Neurosurgery consulted due to presence of SDH/SAH.     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 Went to bed Saturday evening and had a mechanical fall. Currently feels well but has occasionally mild, frontal headache. No associated changes in vision, diplopia, dizziness, nausea/vomiting. 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 Denies any motor/sensory deficits, focal weakness, slurring speech.  Patient Active Problem List   Diagnosis Date Noted  . SDH (subdural hematoma) ([place] 02/28/2017  . Osteoporosis with fracture 12/03/2016  . 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 Age-related osteoporosis without current pathological fracture 07/08/2016  . IDA (iron deficiency anemia) 10/30/2015  . Need for prophylactic vaccination and inoculation against influenza 10/25/2015  . Laryngopharyngeal reflux (LPR) 09/03/2015  . Cough 02/21/2015  . 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 Iron deficiency anemia 10/12/2013  . DOE (dyspnea on exertion) 01/30/2013  . 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 History of atypical migraine 03/03/2012  . Nuclear cataract 03/03/2012  . Posterior vitreous detachment 03/03/2012  . Low back pain 06/03/2011  . Back pain 06/03/2011  . Chest pain 09/28/2010  . Personal history of endocrine/metabolic/immunity disorder 04/04/2010  . Encounter for long-term (current) use of medications 04/04/2010  . METATARSALGIA 03/18/2010  . IDIOPATHIC  OSTEOPOROSIS 03/18/2010  . INSOMNIA UNSPECIFIED 08/13/2009  . Anxiety state 11/29/2008  . ILIOTIBIAL BAND SYNDROME, LEFT KNEE 08/23/2008  . MIGRAINE HEADACHE 07/28/2007  . UNSPECIFIED MYOPATHY 07/28/2007  . 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 TREMOR, ESSENTIAL/FAMILIAL 03/25/2006  . COPD (chronic obstructive pulmonary disease) ([place] 03/25/2006  . IRRITABLE BOWEL SYNDROME 03/25/2006  . MENOPAUSAL SYNDROME 03/25/2006    PMH: Past Medical History:  Diagnosis Date  . Allergy   . Anemia   . Anxiety   . Asthma    DR. BYRUM  . Blood transfusion without reported diagnosis   . Cholelithiasis   . Chronic kidney disease    gallstones   . Compression fracture of body of thoracic vertebra (HCC)   . Depression   . Dyspnea   . Dysrhythmia    palpitations  . GERD (gastroesophageal reflux disease)   . Heart attack ([place]   . Hemorrhoids   . IBS (irritable bowel syndrome)   . Iliotibial band syndrome    left knee  . Migraine   . Mitochondrial myopathy   . Normal coronary arteries    by cardiac catheterization performed by myself 02/25/01  . Osteoporosis   . Pericarditis    age 72 . Pneumothorax   . 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 PVC's (premature ventricular contractions)   . Vitamin D deficiency     PSH: Past Surgical History:  Procedure Laterality Date  . APPENDECTOMY    . CARDIAC CATHETERIZATION    . CATARACT EXTRACTION W/ INTRAOCULAR LENS  IMPLANT, BILATERAL    . CESAREAN SECTION     x 4  . COLONOSCOPY    . DILATATION & CURETTAGE/HYSTEROSCOPY WITH MYOSURE N/A 10/29/2014   Procedure: DILATATION & CURETTAGE/HYSTEROSCOPY WITH MYOSURE;  Surgeon: Paula Compton, MD;  Location: [facility];  Service: Gynecology;  Laterality: N/A;  . IR FLUORO GUIDED NEEDLE PLC ASPIRATION/INJECTION LOC  06/10/2016  . IR FLUORO GUIDED NEEDLE PLC ASPIRATION/INJECTION LOC  06/10/2016  . KYPHOPLASTY N/A 05/14/2016   Procedure: T 11 KYPHOPLASTY;  Surgeon: Phylliss Bob, MD;  Location: [facility];  Service:  Orthopedics;  Laterality: N/A;  T 11 KYPHOPLASTY  . KYPHOPLASTY N/A 06/18/2016   Procedure: THORACIC 12 KYPHOPLASTY;  Surgeon: Phylliss Bob, MD;  Location: [facility];  Service: Orthopedics;  Laterality: N/A;  THORACIC 12 KYPHOPLASTY; REQUEST 1 HOUR AND FLIP ROOM  . MOUTH SURGERY    . NASAL SINUS SURGERY    . NM MYOCAR PERF WALL MOTION  04/27/2006   normal  . US ECHOCARDIOGRAPHY  12/25/2010   normal  . VEIN SURGERY      Medications Prior to Admission  Medication Sig Dispense Refill Last Dose  . aspirin 81 MG chewable tablet Chew 81 mg by mouth daily.    02/28/2017 at 1000  . Estradiol-Norethindrone Acet (LOPREEZA) 0.5-0.1 MG per tablet Take 1 tablet by mouth daily.   02/28/2017 at Unknown time  . ibuprofen (ADVIL,MOTRIN) 200 MG tablet Take 400-600 mg by mouth every 6 (six) hours as needed for headache.   02/28/2017 at Unknown time  . LORazepam (ATIVAN) 0.5 MG tablet TAKE 1 TABLET BY MOUTH EVERY NIGHT AT BEDTIME 30 tablet 0 02/26/2017  . pantoprazole (PROTONIX) 40 MG tablet take 1 tablet by mouth daily before meals   02/27/2017 at Unknown time  . PRESCRIPTION MEDICATION Take 1 tablet by mouth as needed (diarrhea). Old anti-diarrheal prescription, unsure of name.   02/27/2017 at Unknown time  . SYMBICORT 80-4.5 MCG/ACT inhaler Inhale 2 puffs into the lungs 2 (two) times daily.  1 02/28/2017 at Unknown time  . Vitamin D, Ergocalciferol, (DRISDOL) 50000 units CAPS capsule Take 1 capsule (50,000 Units total) by mouth every 7 (seven) days. Sundays 30 capsule 3 02/28/2017 at Unknown time  . albuterol (PROVENTIL HFA;VENTOLIN HFA) 108 (90 Base) MCG/ACT inhaler Inhale 2 puffs into the lungs every 4 (four) hours as needed for wheezing or shortness of breath. 1 Inhaler 4 Taking  . SHINGRIX injection Inject 0.5 mLs into the muscle once.  0 01/21/2017    SH: Social History   Tobacco Use  . Smoking status: Never Smoker  . Smokeless tobacco: Never Used  . Tobacco comment: never used tobacco  Substance Use Topics  .  Alcohol use: Yes    Alcohol/week: 1.2 oz    Types: 2 Glasses of wine per week    Comment: occasionally  . Drug use: No    MEDS: Prior to Admission medications   Medication Sig Start Date End Date Taking? Authorizing Provider  aspirin 81 MG chewable tablet Chew 81 mg by mouth daily.    Yes [provider]  Estradiol-Norethindrone Acet (LOPREEZA) 0.5-0.1 MG per tablet Take 1 tablet by mouth daily.   Yes [provider]  ibuprofen (ADVIL,MOTRIN) 200 MG tablet Take 400-600 mg by mouth every 6 (six) hours as needed for headache.   Yes [provider]  LORazepam (ATIVAN) 0.5 MG tablet TAKE 1 TABLET BY MOUTH EVERY NIGHT AT BEDTIME 05/01/14  Yes Leandrew Koyanagi, MD  pantoprazole (PROTONIX) 40 MG tablet take 1 tablet by mouth daily before meals 10/13/16  Yes [provider]  PRESCRIPTION MEDICATION Take 1 tablet by mouth as needed (diarrhea). Old anti-diarrheal prescription, unsure of name.   Yes [provider]  SYMBICORT 80-4.5 MCG/ACT inhaler Inhale 2 puffs into the lungs 2 (two) times daily. 09/15/16  Yes [provider]  Vitamin D, Ergocalciferol, (DRISDOL) 50000 units CAPS capsule Take 1 capsule (50,000 Units total) by mouth every 7 (seven) days. Sundays 07/08/16  Yes [name], MD  albuterol (PROVENTIL HFA;VENTOLIN HFA) 108 (90 Base) MCG/ACT inhaler Inhale 2 puffs into the lungs every 4 (four) hours as needed for wheezing or shortness of breath. 01/15/16   Collene Gobble, MD  [facility] injection Inject 0.5 mLs into the muscle once. 01/21/17   [provider]    ALLERGY: Allergies  Allergen Reactions  . Dulera [Mometasone Furo-Formoterol Fum] Cough    Social History   Tobacco Use  . Smoking status: Never Smoker  . Smokeless tobacco: Never Used  . Tobacco comment: never used tobacco  Substance Use Topics  . Alcohol use: Yes    Alcohol/week: 1.2 oz    Types: 2 Glasses of wine per week    Comment: occasionally      Family History  Problem Relation Age of Onset  . Asthma Mother   . Arthritis Mother   . Depression Mother   . Cancer Father   . Emphysema Father   . Stroke Father   . Alcohol abuse Father   . Heart attack Father   . Colon cancer Neg Hx      ROS   Review of Systems  Constitutional: Negative.   HENT: Negative.   Eyes: Negative for blurred vision, double vision, photophobia, pain, discharge and redness.  Respiratory: Negative.   Cardiovascular: Negative.   Gastrointestinal: Negative for nausea and vomiting.  Genitourinary: Negative.   Musculoskeletal: Positive for falls. Negative for back pain, joint pain, myalgias and neck pain.  Neurological: Positive for headaches (intermittent). Negative for dizziness, tingling, tremors, sensory change, speech change, focal weakness, seizures and loss of consciousness.    Exam   Vitals:   03/01/17 0646 03/01/17 1020  BP: 139/69 127/68  Pulse: 79 80  Resp: 17 20  Temp: 98.2 F (36.8 C) 98.6 F (37 C)  SpO2: 97% 97%   General appearance: WDWN, resting comfortably, NAD, right periorbital and maxillofacial bruising Eyes: PERRL, Fundoscopic: normal Cardiovascular: Regular rate and rhythm without murmurs, rubs, gallops. No edema or variciosities. Distal pulses normal. Pulmonary: Clear to auscultation Musculoskeletal:     Muscle tone upper extremities: Normal    Muscle tone lower extremities: Normal    Motor exam: Upper Extremities Deltoid Bicep Tricep Grip  Right 5/5 5/5 5/5 5/5  Left 5/5 5/5 5/5 5/5   Lower Extremity IP Quad PF DF EHL  Right 5/5 5/5 5/5 5/5 5/5  Left 5/5 5/5 5/5 5/5 5/5   Neurological Awake, alert, oriented Memory and concentration grossly intact Speech fluent, appropriate CNII: Visual fields normal CNIII/IV/VI: EOMI CNV: Facial sensation normal CNVII: Symmetric, normal strength CNVIII: Grossly normal CNIX: Normal palate movement CNXI: Trap and SCM strength normal CN XII: Tongue protrusion  normal Sensation grossly intact to LT DTR: Normal Coordination (finger/nose & heel/shin): Normal  Results - Imaging/Labs   Results for orders placed or performed during the hospital encounter of 02/28/17 (from the past 48 hour(s))  CBC with Differential     Status: Abnormal   Collection Time: 02/28/17  3:25 PM  Result Value Ref Range   WBC 8.9 4.0 - 10.5 K/uL   RBC 4.15 3.87 - 5.11 MIL/uL   Hemoglobin 14.7 12.0 - 15.0 g/dL   HCT 42.4 36.0 - 46.0 %  MCV 102.2 (H) 78.0 - 100.0 fL   MCH 35.4 (H) 26.0 - 34.0 pg   MCHC 34.7 30.0 - 36.0 g/dL   RDW 12.5 11.5 - 15.5 %   Platelets 132 (L) 150 - 400 K/uL   Neutrophils Relative % 76 %   Neutro Abs 6.8 1.7 - 7.7 K/uL   Lymphocytes Relative 12 %   Lymphs Abs 1.1 0.7 - 4.0 K/uL   Monocytes Relative 11 %   Monocytes Absolute 1.0 0.1 - 1.0 K/uL   Eosinophils Relative 1 %   Eosinophils Absolute 0.0 0.0 - 0.7 K/uL   Basophils Relative 0 %   Basophils Absolute 0.0 0.0 - 0.1 K/uL    Comment: Performed at [facility], [place] [address], [place], [place] [address]  Basic metabolic panel     Status: Abnormal   Collection Time: 02/28/17  3:25 PM  Result Value Ref Range   Sodium 138 135 - 145 mmol/L   Potassium 3.7 3.5 - 5.1 mmol/L   Chloride 107 101 - 111 mmol/L   CO2 24 22 - 32 mmol/L   Glucose, Bld 102 (H) 65 - 99 mg/dL   BUN 12 6 - 20 mg/dL   Creatinine, Ser 0.49 0.44 - 1.00 mg/dL   Calcium 8.7 (L) 8.9 - 10.3 mg/dL   GFR calc non Af Amer >60 >60 mL/min   GFR calc Af Amer >60 >60 mL/min    Comment: (NOTE) The eGFR has been calculated using the CKD EPI equation. This calculation has not been validated in all clinical situations. eGFR's persistently <60 mL/min signify possible Chronic Kidney Disease.    Anion gap 7 5 - 15    Comment: Performed at [facility], [place] [address] [place], [place] [address]    Dg Chest 2 View  Result Date: 02/28/2017 CLINICAL DATA:  Left chest pain following a fall  yesterday. EXAM: CHEST  2 VIEW COMPARISON:  05/14/2016. FINDINGS: Borderline enlarged cardiac silhouette. Clear lungs. To lower thoracic vertebral compression deformities with interval kyphoplasty material. Stable L2 vertebral compression deformity. Old, healed right humeral neck fracture without significant change. IMPRESSION: No acute abnormality. Electronically Signed   By: Claudie Revering M.D.   On: 02/28/2017 13:38   Ct Head Wo Contrast  Result Date: 03/01/2017 CLINICAL DATA:  Recent fall with subdural hematoma, follow-up examination EXAM: CT HEAD WITHOUT CONTRAST TECHNIQUE: Contiguous axial images were obtained from the base of the skull through the vertex without intravenous contrast. COMPARISON:  02/28/2017 FINDINGS: Brain: Bilateral subarachnoid hemorrhage is again identified involving the right temporal, right occipital and left frontal parietal regions stable from the prior exam. No definitive parenchymal hemorrhage is seen. A small right frontal and para fall seen subdural hematoma is noted which again measures 6 mm along the inferior aspect and 4 mm more superiorly. This is overall stable from the prior exam. No new subdural hematoma is seen. No findings to suggest acute ischemia or space-occupying mass lesion are noted. Vascular: No hyperdense vessel or unexpected calcification. Skull: Normal. Negative for fracture or focal lesion. Sinuses/Orbits: No acute finding. Other: None. IMPRESSION: Stable appearing bilateral subarachnoid hemorrhage and subdural hematoma on the right. No new focal abnormality is seen. Electronically Signed   By: Inez Catalina M.D.   On: 03/01/2017 08:23   Ct Head Wo Contrast  Result Date: 02/28/2017 CLINICAL DATA:  Unwitnessed fall last night. Patient complains of headache. Significant bruising to the right eye and face. EXAM: CT HEAD WITHOUT CONTRAST CT MAXILLOFACIAL  WITHOUT CONTRAST CT CERVICAL SPINE WITHOUT CONTRAST TECHNIQUE: Multidetector CT imaging of the head, cervical  spine, and maxillofacial structures were performed using the standard protocol without intravenous contrast. Multiplanar CT image reconstructions of the cervical spine and maxillofacial structures were also generated. COMPARISON:  None. FINDINGS: CT HEAD FINDINGS Brain: There is bilateral cerebral subarachnoid hemorrhage. It could be seen in the right temporal, right occipital, right frontal, left frontal, left occipital lobes. Minute foci of intraparenchymal hemorrhage in the convexity of the right frontal lobe. There is a thin right frontoparietal subdural hematoma, measuring 5 mm in maximum thickness frontally. No evidence of acute infarction. No evidence of significant mass effect or midline shift. Moderate brain parenchymal volume loss and mild white matter microangiopathy. Vascular: No hyperdense vessel or unexpected calcification. Skull: Normal. Negative for fracture or focal lesion. Other: Right frontal scalp hematoma. CT MAXILLOFACIAL FINDINGS Osseous: No fracture or mandibular dislocation. No destructive process. Orbits: Negative. No traumatic or inflammatory finding. Sinuses: Partial opacification of the left sphenoid sinus. Soft tissues: Right preseptal periorbital soft tissue hematoma with extension to the right face. CT CERVICAL SPINE FINDINGS Alignment: Straightening of cervical lordosis. Skull base and vertebrae: No acute fracture. No primary bone lesion or focal pathologic process. Soft tissues and spinal canal: No prevertebral fluid or swelling. No visible canal hematoma. Disc levels: 3 mild osteoarthritic changes at C5-C6. Moderate multilevel posterior facet arthropathy. Upper chest: Negative. Other: None. IMPRESSION: Bilateral cerebral subarachnoid hemorrhage, most prevalent in the right temporal lobe. Thin right frontoparietal subdural hematoma, measuring 4.5 mm in maximum thickness frontally. Minute foci of intraparenchymal hemorrhage in the right frontal lobe. No evidence of significant mass  effect. No evidence of skull or facial fractures. Right frontal, periorbital and facial hematoma. No evidence of acute traumatic injury to the cervical spine. These results were called by telephone at the time of interpretation on 02/28/2017 at 2:04 pm to PA [facility] , who verbally acknowledged these results. Electronically Signed   By: Fidela Salisbury M.D.   On: 02/28/2017 14:24   Ct Cervical Spine Wo Contrast  Result Date: 02/28/2017 CLINICAL DATA:  Unwitnessed fall last night. Patient complains of headache. Significant bruising to the right eye and face. EXAM: CT HEAD WITHOUT CONTRAST CT MAXILLOFACIAL WITHOUT CONTRAST CT CERVICAL SPINE WITHOUT CONTRAST TECHNIQUE: Multidetector CT imaging of the head, cervical spine, and maxillofacial structures were performed using the standard protocol without intravenous contrast. Multiplanar CT image reconstructions of the cervical spine and maxillofacial structures were also generated. COMPARISON:  None. FINDINGS: CT HEAD FINDINGS Brain: There is bilateral cerebral subarachnoid hemorrhage. It could be seen in the right temporal, right occipital, right frontal, left frontal, left occipital lobes. Minute foci of intraparenchymal hemorrhage in the convexity of the right frontal lobe. There is a thin right frontoparietal subdural hematoma, measuring 5 mm in maximum thickness frontally. No evidence of acute infarction. No evidence of significant mass effect or midline shift. Moderate brain parenchymal volume loss and mild white matter microangiopathy. Vascular: No hyperdense vessel or unexpected calcification. Skull: Normal. Negative for fracture or focal lesion. Other: Right frontal scalp hematoma. CT MAXILLOFACIAL FINDINGS Osseous: No fracture or mandibular dislocation. No destructive process. Orbits: Negative. No traumatic or inflammatory finding. Sinuses: Partial opacification of the left sphenoid sinus. Soft tissues: Right preseptal periorbital soft tissue hematoma  with extension to the right face. CT CERVICAL SPINE FINDINGS Alignment: Straightening of cervical lordosis. Skull base and vertebrae: No acute fracture. No primary bone lesion or focal pathologic process. Soft tissues and spinal canal:  No prevertebral fluid or swelling. No visible canal hematoma. Disc levels: 3 mild osteoarthritic changes at C5-C6. Moderate multilevel posterior facet arthropathy. Upper chest: Negative. Other: None. IMPRESSION: Bilateral cerebral subarachnoid hemorrhage, most prevalent in the right temporal lobe. Thin right frontoparietal subdural hematoma, measuring 4.5 mm in maximum thickness frontally. Minute foci of intraparenchymal hemorrhage in the right frontal lobe. No evidence of significant mass effect. No evidence of skull or facial fractures. Right frontal, periorbital and facial hematoma. No evidence of acute traumatic injury to the cervical spine. These results were called by telephone at the time of interpretation on 02/28/2017 at 2:04 pm to PA [facility] , who verbally acknowledged these results. Electronically Signed   By: Fidela Salisbury M.D.   On: 02/28/2017 14:24   Ct Maxillofacial Wo Cm  Result Date: 02/28/2017 CLINICAL DATA:  Unwitnessed fall last night. Patient complains of headache. Significant bruising to the right eye and face. EXAM: CT HEAD WITHOUT CONTRAST CT MAXILLOFACIAL WITHOUT CONTRAST CT CERVICAL SPINE WITHOUT CONTRAST TECHNIQUE: Multidetector CT imaging of the head, cervical spine, and maxillofacial structures were performed using the standard protocol without intravenous contrast. Multiplanar CT image reconstructions of the cervical spine and maxillofacial structures were also generated. COMPARISON:  None. FINDINGS: CT HEAD FINDINGS Brain: There is bilateral cerebral subarachnoid hemorrhage. It could be seen in the right temporal, right occipital, right frontal, left frontal, left occipital lobes. Minute foci of intraparenchymal hemorrhage in the convexity  of the right frontal lobe. There is a thin right frontoparietal subdural hematoma, measuring 5 mm in maximum thickness frontally. No evidence of acute infarction. No evidence of significant mass effect or midline shift. Moderate brain parenchymal volume loss and mild white matter microangiopathy. Vascular: No hyperdense vessel or unexpected calcification. Skull: Normal. Negative for fracture or focal lesion. Other: Right frontal scalp hematoma. CT MAXILLOFACIAL FINDINGS Osseous: No fracture or mandibular dislocation. No destructive process. Orbits: Negative. No traumatic or inflammatory finding. Sinuses: Partial opacification of the left sphenoid sinus. Soft tissues: Right preseptal periorbital soft tissue hematoma with extension to the right face. CT CERVICAL SPINE FINDINGS Alignment: Straightening of cervical lordosis. Skull base and vertebrae: No acute fracture. No primary bone lesion or focal pathologic process. Soft tissues and spinal canal: No prevertebral fluid or swelling. No visible canal hematoma. Disc levels: 3 mild osteoarthritic changes at C5-C6. Moderate multilevel posterior facet arthropathy. Upper chest: Negative. Other: None. IMPRESSION: Bilateral cerebral subarachnoid hemorrhage, most prevalent in the right temporal lobe. Thin right frontoparietal subdural hematoma, measuring 4.5 mm in maximum thickness frontally. Minute foci of intraparenchymal hemorrhage in the right frontal lobe. No evidence of significant mass effect. No evidence of skull or facial fractures. Right frontal, periorbital and facial hematoma. No evidence of acute traumatic injury to the cervical spine. These results were called by telephone at the time of interpretation on 02/28/2017 at 2:04 pm to PA [facility] , who verbally acknowledged these results. Electronically Signed   By: Fidela Salisbury M.D.   On: 02/28/2017 14:24    Impression/Plan   72 y.o. female with traumatic SDH and SAH after fall. She is neurologically  intact. Injury when she presented to ER was >12 hours prior. She was admitted for obs by TH. Repeat head CT this am was stable.  No need for NS intervention, should resolve with time. I had a long discussion with patient regarding etiology, prognosis, expectations, etc. We discussed Keppra for seizure prophylaxis and she declines, understands risks of this. Will need outpt follow up for monitoring of  SDH/SAH. Rec holding ASA until followup.Cleared for discharge from [facility].

## 2017-03-01 NOTE — Evaluation (Signed)
Physical Therapy Evaluation Patient Details Name: [name] MRN: [id number] DOB: [birth year] Today's Date: 03/01/2017   History of Present Illness  [name] is a 72 y.o. female with PMH of Familial tremors syndrome, Mitochondrial myopahty with recurrent falls, alcohol use, asthma presented with fall. CT revealed SDH, stable.  Clinical Impression  Pt admitted with above. Pt ambulation tolerance limited by onset of SOB/asthma and R flank pain. Discussed splinting R side with folded blanket to help with pain management. Pt with h/o tremors and freq falls. Has been to [facility] multiple times to figure out her muscular dysfunction. Pt with supportive family and motivated to keep her independence. Acute PT to follow.    Follow Up Recommendations No PT follow up;Supervision/Assistance - 24 hour(24/7 initially for a few days)    Equipment Recommendations  None recommended by PT    Recommendations for Other Services       Precautions / Restrictions Precautions Precautions: Fall Precaution Comments: freq falls, constant tremors      Mobility  Bed Mobility Overal bed mobility: Needs Assistance Bed Mobility: Supine to Sit;Sit to Supine     Supine to sit: Min assist(due to R flank pain) Sit to supine: Supervision   General bed mobility comments: pt with increased pain due to R flank pain, educated on splinting, minA for trunk elevation, pt however able to return self to supine without physical assist  Transfers Overall transfer level: Needs assistance Equipment used: 1 person hand held assist Transfers: Sit to/from Stand Sit to Stand: Min assist         General transfer comment: minA to steady pt due to R flank pain, increased time  Ambulation/Gait Ambulation/Gait assistance: Min assist Ambulation Distance (Feet): 100 Feet Assistive device: 1 person hand held assist Gait Pattern/deviations: Step-through pattern;Decreased stride length;Wide base of support Gait  velocity: slow and guarded Gait velocity interpretation: Below normal speed for age/gender General Gait Details: slow, guarded, pt with R flank pain, antaglic, onset of SOB, pt reports "my asthma is activing up" Pt took 2 puffs of her albuterol inhaler upon return to room, RN and MD notified, as well as resp therapist  Stairs Stairs: (discused splinting R flank and using L UE on railing)          Wheelchair Mobility    Modified Rankin (Stroke Patients Only)       Balance Overall balance assessment: Needs assistance Sitting-balance support: Feet supported;No upper extremity supported Sitting balance-Leahy Scale: Good     Standing balance support: Single extremity supported Standing balance-Leahy Scale: Fair Standing balance comment: support due to pain                             Pertinent Vitals/Pain Pain Assessment: 0-10 Pain Score: 8  Pain Location: R flank at sore rib Pain Descriptors / Indicators: Sore Pain Intervention(s): Monitored during session(educated on using folded blanked for splinting R flank)    Home Living Family/patient expects to be discharged to:: Private residence Living Arrangements: Spouse/significant other Available Help at Discharge: Family;Available PRN/intermittently(spouse is a Pharmacist, hospital) Type of Home: House Home Access: Stairs to enter Entrance Stairs-Rails: Can reach both Entrance Stairs-Number of Steps: 3 Home Layout: Two level Home Equipment: [place] - 2 wheels;Cane - single point      Prior Function Level of Independence: Independent(uses cane outside)               Hand Dominance   Dominant Hand: Right  Extremity/Trunk Assessment   Upper Extremity Assessment Upper Extremity Assessment: Generalized weakness(due to flank pain)    Lower Extremity Assessment Lower Extremity Assessment: Generalized weakness    Cervical / Trunk Assessment Cervical / Trunk Assessment: Normal  Communication   Communication:  No difficulties  Cognition Arousal/Alertness: Awake/alert Behavior During Therapy: WFL for tasks assessed/performed Overall Cognitive Status: Within Functional Limits for tasks assessed                                        General Comments General comments (skin integrity, edema, etc.): discussed tremor management extensively and possibly revisiting medication. Dr. Eliseo Squires present to follow up. discussed a weighted-blanket and placing feet up against firm surface to minimize tremors as well    Exercises     Assessment/Plan    PT Assessment Patient needs continued PT services  PT Problem List Decreased strength;Decreased range of motion;Decreased activity tolerance;Decreased balance;Decreased mobility;Decreased coordination;Decreased knowledge of use of DME       PT Treatment Interventions DME instruction;Gait training;Stair training;Functional mobility training;Therapeutic activities;Therapeutic exercise;Balance training;Neuromuscular re-education    PT Goals (Current goals can be found in the Care Plan section)  Acute Rehab PT Goals Patient Stated Goal: home today PT Goal Formulation: With patient Time For Goal Achievement: 03/15/17 Potential to Achieve Goals: Good    Frequency Min 3X/week   Barriers to discharge Decreased caregiver support spouse works during the day    Co-evaluation               AM-PAC PT "6 Clicks" Daily Activity  Outcome Measure Difficulty turning over in bed (including adjusting bedclothes, sheets and blankets)?: A Little Difficulty moving from lying on back to sitting on the side of the bed? : A Little Difficulty sitting down on and standing up from a chair with arms (e.g., wheelchair, bedside commode, etc,.)?: A Lot Help needed moving to and from a bed to chair (including a wheelchair)?: A Little Help needed walking in hospital room?: A Little Help needed climbing 3-5 steps with a railing? : A Lot 6 Click Score: 16    End  of Session Equipment Utilized During Treatment: Gait belt Activity Tolerance: Patient tolerated treatment well Patient left: in chair;with call bell/phone within reach;with chair alarm set Nurse Communication: Mobility status PT Visit Diagnosis: Unsteadiness on feet (R26.81);History of falling (Z91.81)    Time: 3094-0768 PT Time Calculation (min) (ACUTE ONLY): 41 min   Charges:   PT Evaluation $PT Eval Moderate Complexity: 1 Mod PT Treatments $Gait Training: 8-22 mins $Therapeutic Activity: 8-22 mins   PT G Codes:        Kittie Plater, PT, DPT Pager #: [phone] Office #: [phone]   [place] 03/01/2017, 10:17 AM

## 2017-03-05 ENCOUNTER — Other Ambulatory Visit: Payer: Self-pay | Admitting: Neurosurgery

## 2017-03-05 DIAGNOSIS — S065XAA Traumatic subdural hemorrhage with loss of consciousness status unknown, initial encounter: Secondary | ICD-10-CM

## 2017-03-05 DIAGNOSIS — S065X9A Traumatic subdural hemorrhage with loss of consciousness of unspecified duration, initial encounter: Secondary | ICD-10-CM

## 2017-03-16 ENCOUNTER — Ambulatory Visit
Admission: RE | Admit: 2017-03-16 | Discharge: 2017-03-16 | Disposition: A | Discharge status: Home / Self Care | Payer: Medicare Other | Source: Ambulatory Visit | Attending: Neurosurgery | Admitting: Neurosurgery

## 2017-03-16 DIAGNOSIS — S065X9A Traumatic subdural hemorrhage with loss of consciousness of unspecified duration, initial encounter: Secondary | ICD-10-CM

## 2017-03-16 DIAGNOSIS — I6200 Nontraumatic subdural hemorrhage, unspecified: Secondary | ICD-10-CM | POA: Diagnosis not present

## 2017-03-16 DIAGNOSIS — S065XAA Traumatic subdural hemorrhage with loss of consciousness status unknown, initial encounter: Secondary | ICD-10-CM

## 2017-03-18 DIAGNOSIS — S065X9A Traumatic subdural hemorrhage with loss of consciousness of unspecified duration, initial encounter: Secondary | ICD-10-CM | POA: Diagnosis not present

## 2017-03-24 ENCOUNTER — Inpatient Hospital Stay: Payer: Medicare Other | Admitting: Family

## 2017-03-24 ENCOUNTER — Inpatient Hospital Stay: Payer: Medicare Other

## 2017-03-30 DIAGNOSIS — R0781 Pleurodynia: Secondary | ICD-10-CM | POA: Diagnosis not present

## 2017-03-30 DIAGNOSIS — R2689 Other abnormalities of gait and mobility: Secondary | ICD-10-CM | POA: Diagnosis not present

## 2017-03-30 DIAGNOSIS — G713 Mitochondrial myopathy, not elsewhere classified: Secondary | ICD-10-CM | POA: Diagnosis not present

## 2017-03-30 DIAGNOSIS — G44309 Post-traumatic headache, unspecified, not intractable: Secondary | ICD-10-CM | POA: Diagnosis not present

## 2017-03-30 DIAGNOSIS — Z6824 Body mass index (BMI) 24.0-24.9, adult: Secondary | ICD-10-CM | POA: Diagnosis not present

## 2017-03-30 DIAGNOSIS — I609 Nontraumatic subarachnoid hemorrhage, unspecified: Secondary | ICD-10-CM | POA: Diagnosis not present

## 2017-03-30 DIAGNOSIS — I6200 Nontraumatic subdural hemorrhage, unspecified: Secondary | ICD-10-CM | POA: Diagnosis not present

## 2017-03-31 ENCOUNTER — Inpatient Hospital Stay: Payer: Medicare Other

## 2017-03-31 ENCOUNTER — Inpatient Hospital Stay: Payer: Medicare Other | Attending: Hematology & Oncology

## 2017-03-31 ENCOUNTER — Inpatient Hospital Stay (HOSPITAL_BASED_OUTPATIENT_CLINIC_OR_DEPARTMENT_OTHER): Payer: Medicare Other | Admitting: Family

## 2017-03-31 VITALS — BP 133/54 | HR 75 | Temp 98.0°F | Resp 16 | Wt 141.2 lb

## 2017-03-31 DIAGNOSIS — D45 Polycythemia vera: Secondary | ICD-10-CM | POA: Insufficient documentation

## 2017-03-31 DIAGNOSIS — M810 Age-related osteoporosis without current pathological fracture: Secondary | ICD-10-CM | POA: Diagnosis not present

## 2017-03-31 DIAGNOSIS — G713 Mitochondrial myopathy, not elsewhere classified: Secondary | ICD-10-CM | POA: Diagnosis not present

## 2017-03-31 DIAGNOSIS — D500 Iron deficiency anemia secondary to blood loss (chronic): Secondary | ICD-10-CM

## 2017-03-31 LAB — CMP (CANCER CENTER ONLY)
ALT: 26 U/L (ref 10–47)
AST: 34 U/L (ref 11–38)
Albumin: 3.5 g/dL (ref 3.5–5.0)
Alkaline Phosphatase: 78 U/L (ref 26–84)
Anion gap: 7 (ref 5–15)
BUN: 10 mg/dL (ref 7–22)
CO2: 27 mmol/L (ref 18–33)
Calcium: 8.9 mg/dL (ref 8.0–10.3)
Chloride: 106 mmol/L (ref 98–108)
Creatinine: 0.3 mg/dL — ABNORMAL LOW (ref 0.60–1.20)
Glucose, Bld: 109 mg/dL (ref 73–118)
Potassium: 3.6 mmol/L (ref 3.3–4.7)
Sodium: 140 mmol/L (ref 128–145)
Total Bilirubin: 1.3 mg/dL (ref 0.2–1.6)
Total Protein: 6.9 g/dL (ref 6.4–8.1)

## 2017-03-31 LAB — CBC WITH DIFFERENTIAL (CANCER CENTER ONLY)
Basophils Absolute: 0 10*3/uL (ref 0.0–0.1)
Basophils Relative: 0 %
Eosinophils Absolute: 0.3 10*3/uL (ref 0.0–0.5)
Eosinophils Relative: 3 %
HCT: 43.2 % (ref 34.8–46.6)
Hemoglobin: 15.7 g/dL (ref 11.6–15.9)
Lymphocytes Relative: 15 %
Lymphs Abs: 1.2 10*3/uL (ref 0.9–3.3)
MCH: 36.5 pg — ABNORMAL HIGH (ref 26.0–34.0)
MCHC: 36.3 g/dL — ABNORMAL HIGH (ref 32.0–36.0)
MCV: 100.5 fL (ref 81.0–101.0)
Monocytes Absolute: 0.7 10*3/uL (ref 0.1–0.9)
Monocytes Relative: 10 %
Neutro Abs: 5.4 10*3/uL (ref 1.5–6.5)
Neutrophils Relative %: 72 %
Platelet Count: 155 10*3/uL (ref 145–400)
RBC: 4.3 MIL/uL (ref 3.70–5.32)
RDW: 11.8 % (ref 11.1–15.7)
WBC Count: 7.6 10*3/uL (ref 3.9–10.0)

## 2017-03-31 NOTE — Progress Notes (Signed)
Hematology and Oncology Follow Up Visit  [name] [id number] [birth year] 72 y.o. 03/31/2017   Principle Diagnosis:  1. 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 Osteoporosis - with fracture at T11  Current Therapy:   1. Phlebotomy to maintain hematocrit below 45% 2. Aspirin 81 mg p.o. Daily. 3. IV iron as indicated - last received Dec/Jan x 2 4. Zometa IV every 6 months - next doseduein May 2019   Interim History:  [name] is here today for follow-up. She is doing well but did have a fall at home a month ago and hit her head. She does not remember falling. She had a bilateral subarachnoid hemorrhage more pronounced on the right and thin right frontoparietal subdural hematoma. Repeat CT 2 weeks later shoed marked improvement.  Hct is 43.2. No phlebotomy needed this visit.  She has had no fever, chills, n/v, cough, rash, SOB, chest pain, abdominal pain or changes in bowel or bladder habits.  Her occasional dizziness and palpitations are unchanged.  No swelling, tenderness, numbness or tingling in her extremities.  She has a good appetite and is staying well hydrated. Her weight is stable.   ECOG Performance Status: 1 - Symptomatic but completely ambulatory  Medications:  Allergies as of 03/31/2017      Reactions   Dulera [mometasone Furo-formoterol Fum] Cough      Medication List        Accurate as of 03/31/17  1:55 PM. Always use your most recent med list.          albuterol 108 (90 Base) MCG/ACT inhaler Commonly known as:  PROVENTIL HFA;VENTOLIN HFA Inhale 2 puffs into the lungs every 4 (four) hours as needed for wheezing or shortness of breath.   LOPREEZA 0.5-0.1 MG tablet Generic drug:  Estradiol-Norethindrone Acet Take 1 tablet by mouth daily.   LORazepam 0.5 MG tablet Commonly known as:  ATIVAN TAKE 1 TABLET BY  MOUTH EVERY NIGHT AT BEDTIME   pantoprazole 40 MG tablet Commonly known as:  PROTONIX take 1 tablet by mouth daily before meals   PRESCRIPTION MEDICATION Take 1 tablet by mouth as needed (diarrhea). Old anti-diarrheal prescription, unsure of name.   SYMBICORT 80-4.5 MCG/ACT inhaler Generic drug:  budesonide-formoterol Inhale 2 puffs into the lungs 2 (two) times daily.   Vitamin D (Ergocalciferol) 50000 units Caps capsule Commonly known as:  DRISDOL Take 1 capsule (50,000 Units total) by mouth every 7 (seven) days. Sundays       Allergies:  Allergies  Allergen Reactions  . Dulera [Mometasone Furo-Formoterol Fum] Cough    Past Medical History, Surgical history, Social history, and Family History were reviewed and updated.  Review of Systems: All other 10 point review of systems is negative.   Physical Exam:  vitals were not taken for this visit.   Wt Readings from Last 3 Encounters:  02/28/17 140 lb 6.9 oz (63.7 kg)  01/20/17 140 lb (63.5 kg)  12/01/16 138 lb 6.4 oz (62.8 kg)    Ocular: Sclerae unicteric, pupils equal, round and reactive to light Ear-nose-throat: Oropharynx clear, dentition fair Lymphatic: No cervical, supraclavicular or axillary adenopathy Lungs no rales or rhonchi, good excursion bilaterally Heart regular rate and rhythm, no murmur appreciated Abd soft, nontender, positive bowel sounds, no liver or spleen tip palpated on exam, no fluid wave  MSK no focal spinal tenderness, no joint edema Neuro: non-focal, well-oriented, appropriate affect Breasts:  Deferred   Lab Results  Component Value Date   WBC 7.6 03/31/2017   HGB 14.7 02/28/2017   HCT 43.2 03/31/2017   MCV 100.5 03/31/2017   PLT 155 03/31/2017   Lab Results  Component Value Date   FERRITIN 122 01/20/2017   IRON 129 01/20/2017   TIBC 254 01/20/2017   UIBC 125 01/20/2017   IRONPCTSAT 51 01/20/2017   Lab Results  Component Value Date   RETICCTPCT 1.5 06/12/2014   RBC 4.30  03/31/2017   RETICCTABS 65.9 06/12/2014   Lab Results  Component Value Date   KAPLAMBRATIO 0.91 04/27/2016   Lab Results  Component Value Date   IGGSERUM 897 04/27/2016   IGA 339 02/27/2014   IGMSERUM 132 04/27/2016   Lab Results  Component Value Date   MSPIKE Not Observed 04/27/2016     Chemistry      Component Value Date/Time   NA 140 03/31/2017 1317   NA 144 01/20/2017 1344   NA 138 01/17/2016 1356   K 3.6 03/31/2017 1317   K 3.6 01/20/2017 1344   K 4.1 01/17/2016 1356   CL 106 03/31/2017 1317   CL 107 01/20/2017 1344   CO2 27 03/31/2017 1317   CO2 25 01/20/2017 1344   CO2 25 01/17/2016 1356   BUN 10 03/31/2017 1317   BUN 12 01/20/2017 1344   BUN 12.3 01/17/2016 1356   CREATININE 0.30 (L) 03/31/2017 1317   CREATININE 0.7 01/20/2017 1344   CREATININE 0.8 01/17/2016 1356      Component Value Date/Time   CALCIUM 8.9 03/31/2017 1317   CALCIUM 8.9 01/20/2017 1344   CALCIUM 9.1 01/17/2016 1356   ALKPHOS 78 03/31/2017 1317   ALKPHOS 68 01/20/2017 1344   ALKPHOS 87 01/17/2016 1356   AST 34 03/31/2017 1317   AST 48 (H) 01/17/2016 1356   ALT 26 03/31/2017 1317   ALT 49 (H) 01/20/2017 1344   ALT 38 01/17/2016 1356   BILITOT 1.3 03/31/2017 1317   BILITOT 0.67 01/17/2016 1356      Impression and Plan: [name] is a very pleasant 72 yo caucasian female with both hemochromatosis and polycythemia. She continues to do well despite her fall last month. Thankfully she was not more seriously injured.  Hct is 43.2 so no phlebotomy needed at this time.  We will plan to see her back in another 2 months for follow-up. She will also be due to [facility] at that time.  She will contact our office with any questions or concerns. We can certainly see her sooner if need be.   Laverna Peace, NP 3/6/20191:55 PM

## 2017-04-01 LAB — IRON AND TIBC
Iron: 168 ug/dL — ABNORMAL HIGH (ref 41–142)
Saturation Ratios: 63 % — ABNORMAL HIGH (ref 21–57)
TIBC: 266 ug/dL (ref 236–444)
UIBC: 98 ug/dL

## 2017-04-01 LAB — LACTATE DEHYDROGENASE: LDH: 198 U/L (ref 125–245)

## 2017-04-01 LAB — FERRITIN: Ferritin: 111 ng/mL (ref 9–269)

## 2017-05-13 DIAGNOSIS — H43813 Vitreous degeneration, bilateral: Secondary | ICD-10-CM | POA: Diagnosis not present

## 2017-05-13 DIAGNOSIS — H35363 Drusen (degenerative) of macula, bilateral: Secondary | ICD-10-CM | POA: Diagnosis not present

## 2017-05-13 DIAGNOSIS — D3132 Benign neoplasm of left choroid: Secondary | ICD-10-CM | POA: Diagnosis not present

## 2017-05-13 DIAGNOSIS — Z961 Presence of intraocular lens: Secondary | ICD-10-CM | POA: Diagnosis not present

## 2017-05-18 ENCOUNTER — Inpatient Hospital Stay: Payer: Medicare Other

## 2017-05-18 ENCOUNTER — Other Ambulatory Visit: Payer: Self-pay

## 2017-05-18 ENCOUNTER — Inpatient Hospital Stay: Payer: Medicare Other | Attending: Hematology & Oncology | Admitting: Family

## 2017-05-18 VITALS — BP 119/60 | HR 84 | Temp 98.3°F | Resp 18 | Wt 142.0 lb

## 2017-05-18 DIAGNOSIS — D45 Polycythemia vera: Secondary | ICD-10-CM

## 2017-05-18 DIAGNOSIS — M810 Age-related osteoporosis without current pathological fracture: Secondary | ICD-10-CM | POA: Insufficient documentation

## 2017-05-18 DIAGNOSIS — D500 Iron deficiency anemia secondary to blood loss (chronic): Secondary | ICD-10-CM

## 2017-05-18 DIAGNOSIS — G713 Mitochondrial myopathy, not elsewhere classified: Secondary | ICD-10-CM | POA: Diagnosis not present

## 2017-05-18 DIAGNOSIS — Z7982 Long term (current) use of aspirin: Secondary | ICD-10-CM | POA: Diagnosis not present

## 2017-05-18 LAB — CBC WITH DIFFERENTIAL (CANCER CENTER ONLY)
Basophils Absolute: 0 10*3/uL (ref 0.0–0.1)
Basophils Relative: 1 %
Eosinophils Absolute: 0.1 10*3/uL (ref 0.0–0.5)
Eosinophils Relative: 1 %
HCT: 44.9 % (ref 34.8–46.6)
Hemoglobin: 15.6 g/dL (ref 11.6–15.9)
Lymphocytes Relative: 14 %
Lymphs Abs: 0.9 10*3/uL (ref 0.9–3.3)
MCH: 35.3 pg — ABNORMAL HIGH (ref 26.0–34.0)
MCHC: 34.7 g/dL (ref 32.0–36.0)
MCV: 101.6 fL — ABNORMAL HIGH (ref 81.0–101.0)
Monocytes Absolute: 0.7 10*3/uL (ref 0.1–0.9)
Monocytes Relative: 11 %
Neutro Abs: 4.8 10*3/uL (ref 1.5–6.5)
Neutrophils Relative %: 73 %
Platelet Count: 133 10*3/uL — ABNORMAL LOW (ref 145–400)
RBC: 4.42 MIL/uL (ref 3.70–5.32)
RDW: 12.3 % (ref 11.1–15.7)
WBC Count: 6.6 10*3/uL (ref 3.9–10.0)

## 2017-05-18 LAB — CMP (CANCER CENTER ONLY)
ALT: 42 U/L (ref 10–47)
AST: 47 U/L — ABNORMAL HIGH (ref 11–38)
Albumin: 3.2 g/dL — ABNORMAL LOW (ref 3.5–5.0)
Alkaline Phosphatase: 73 U/L (ref 26–84)
Anion gap: 9 (ref 5–15)
BUN: 15 mg/dL (ref 7–22)
CO2: 25 mmol/L (ref 18–33)
Calcium: 9.7 mg/dL (ref 8.0–10.3)
Chloride: 108 mmol/L (ref 98–108)
Creatinine: 0.6 mg/dL (ref 0.60–1.20)
Glucose, Bld: 114 mg/dL (ref 73–118)
Potassium: 3.8 mmol/L (ref 3.3–4.7)
Sodium: 142 mmol/L (ref 128–145)
Total Bilirubin: 1.2 mg/dL (ref 0.2–1.6)
Total Protein: 6.9 g/dL (ref 6.4–8.1)

## 2017-05-18 LAB — LACTATE DEHYDROGENASE: LDH: 178 U/L (ref 125–245)

## 2017-05-18 NOTE — Progress Notes (Signed)
Hematology and Oncology Follow Up Visit  [name] [id number] [birth year] 72 y.o. 05/18/2017   Principle Diagnosis:  1. 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 Osteoporosis - with fracture at T11  Current Therapy:   1. Phlebotomy to maintain hematocrit below 45% 2. Aspirin 81 mg p.o. Daily. 3. IV iron as indicated - last received Dec/Jan x 2 4. Zometa IV every 6 months - next doseduein May 2019   Interim History:  [name] is here today with her husband for follow-up. She is doing well and has no complaints at this time. Her Hct is 44.9%. Since she is asymptomatic she would rather hold off on phlebotomy and re-evaluate at her next follow-up.  Ferritin last month was 111 and iron saturation 63%. Today's result is pending.  No lymphadenopathy noted on exam.  She has had no bleeding, no bruising or petechiae.  No fever, chills, n/v, cough, rash, dizziness, SOB, chest pain, palpitations, abdominal pain or changes in bowel or bladder habits.  Her back pain remains stable and she is wearing a back brace today. No swelling, numbness or tingling in her extremities.  She has maintained a good appetite and is staying well hydrated. Her weight is stable.   ECOG Performance Status: 0 - Asymptomatic  Medications:  Allergies as of 05/18/2017      Reactions   Dulera [mometasone Furo-formoterol Fum] Cough      Medication List        Accurate as of 05/18/17  2:17 PM. Always use your most recent med list.          albuterol 108 (90 Base) MCG/ACT inhaler Commonly known as:  PROVENTIL HFA;VENTOLIN HFA Inhale 2 puffs into the lungs every 4 (four) hours as needed for wheezing or shortness of breath.   LOPREEZA 0.5-0.1 MG tablet Generic drug:  Estradiol-Norethindrone Acet Take 1 tablet by mouth daily.   LORazepam 0.5 MG tablet Commonly  known as:  ATIVAN TAKE 1 TABLET BY MOUTH EVERY NIGHT AT BEDTIME   pantoprazole 40 MG tablet Commonly known as:  PROTONIX take 1 tablet by mouth daily before meals   PRESCRIPTION MEDICATION Take 1 tablet by mouth as needed (diarrhea). Old anti-diarrheal prescription, unsure of name.   SYMBICORT 80-4.5 MCG/ACT inhaler Generic drug:  budesonide-formoterol Inhale 2 puffs into the lungs 2 (two) times daily.   Vitamin D (Ergocalciferol) 50000 units Caps capsule Commonly known as:  DRISDOL Take 1 capsule (50,000 Units total) by mouth every 7 (seven) days. Sundays       Allergies:  Allergies  Allergen Reactions  . Dulera [Mometasone Furo-Formoterol Fum] Cough    Past Medical History, Surgical history, Social history, and Family History were reviewed and updated.  Review of Systems: All other 10 point review of systems is negative.   Physical Exam:  weight is 142 lb (64.4 kg). Her oral temperature is 98.3 F (36.8 C). Her blood pressure is 119/60 and her pulse is 84. Her respiration is 18 and oxygen saturation is 98%.   Wt Readings from Last 3 Encounters:  05/18/17 142 lb (64.4 kg)  03/31/17 141 lb 4 oz (64.1 kg)  02/28/17 140 lb 6.9 oz (63.7 kg)    Ocular: Sclerae unicteric, pupils equal, round and reactive to light Ear-nose-throat: Oropharynx clear, dentition fair Lymphatic: No cervical, supraclavicular or axillary adenopathy Lungs no rales or rhonchi, good excursion bilaterally Heart regular rate and rhythm,  no murmur appreciated Abd soft, nontender, positive bowel sounds, no liver or spleen tip palpated on exam, no fluid wave  MSK no focal spinal tenderness, no joint edema Neuro: non-focal, well-oriented, appropriate affect Breasts: Deferred   Lab Results  Component Value Date   WBC 6.6 05/18/2017   HGB 15.6 05/18/2017   HCT 44.9 05/18/2017   MCV 101.6 (H) 05/18/2017   PLT 133 (L) 05/18/2017   Lab Results  Component Value Date   FERRITIN 111 03/31/2017   IRON  168 (H) 03/31/2017   TIBC 266 03/31/2017   UIBC 98 03/31/2017   IRONPCTSAT 63 (H) 03/31/2017   Lab Results  Component Value Date   RETICCTPCT 1.5 06/12/2014   RBC 4.42 05/18/2017   RETICCTABS 65.9 06/12/2014   Lab Results  Component Value Date   KAPLAMBRATIO 0.91 04/27/2016   Lab Results  Component Value Date   IGGSERUM 897 04/27/2016   IGA 339 02/27/2014   IGMSERUM 132 04/27/2016   Lab Results  Component Value Date   MSPIKE Not Observed 04/27/2016     Chemistry      Component Value Date/Time   NA 142 05/18/2017 1340   NA 144 01/20/2017 1344   NA 138 01/17/2016 1356   K 3.8 05/18/2017 1340   K 3.6 01/20/2017 1344   K 4.1 01/17/2016 1356   CL 108 05/18/2017 1340   CL 107 01/20/2017 1344   CO2 25 05/18/2017 1340   CO2 25 01/20/2017 1344   CO2 25 01/17/2016 1356   BUN 15 05/18/2017 1340   BUN 12 01/20/2017 1344   BUN 12.3 01/17/2016 1356   CREATININE 0.60 05/18/2017 1340   CREATININE 0.7 01/20/2017 1344   CREATININE 0.8 01/17/2016 1356      Component Value Date/Time   CALCIUM 9.7 05/18/2017 1340   CALCIUM 8.9 01/20/2017 1344   CALCIUM 9.1 01/17/2016 1356   ALKPHOS 73 05/18/2017 1340   ALKPHOS 68 01/20/2017 1344   ALKPHOS 87 01/17/2016 1356   AST 47 (H) 05/18/2017 1340   AST 48 (H) 01/17/2016 1356   ALT 42 05/18/2017 1340   ALT 49 (H) 01/20/2017 1344   ALT 38 01/17/2016 1356   BILITOT 1.2 05/18/2017 1340   BILITOT 0.67 01/17/2016 1356      Impression and Plan: [name] is a very pleasant 72 yo caucasian female with both hemochromatosis and polycythemia. She continues to do well and has no complaints at this time.  Hct is 44.9%. She would prefer to wait and re-evaluate at her next appointment and do a phlebotomy at that time if needed with replacement fluids.  She will also be due for her Zometa at her next visit.  We will plan to see her back in another 5-6 weeks for follow-up.  They will contact our office with any questions or concerns. We can  cerainly see her sooner if need be.   Laverna Peace, NP 4/23/20192:17 PM

## 2017-05-19 LAB — IRON AND TIBC
Iron: 163 ug/dL — ABNORMAL HIGH (ref 41–142)
Saturation Ratios: 60 % — ABNORMAL HIGH (ref 21–57)
TIBC: 274 ug/dL (ref 236–444)
UIBC: 111 ug/dL

## 2017-05-19 LAB — FERRITIN: Ferritin: 108 ng/mL (ref 9–269)

## 2017-06-24 IMAGING — MR MR THORACIC SPINE W/O CM
4 of 12 series · 18 of 48 positions shown · non-contrast
Comparison: Lumbar spine MRI 04/04/2016

CLINICAL DATA: Low back pain.  Evaluate compression fractures.

EXAM:
MRI THORACIC AND LUMBAR SPINE WITHOUT CONTRAST
TECHNIQUE: Multiplanar and multiecho pulse sequences of the thoracic and lumbar
spine were obtained without intravenous contrast.

[Series 19: T2 · sagittal · 3.0mm · 0.67mm/px · 3 of 15 slices shown (1 of 4)]
[im 1/15]
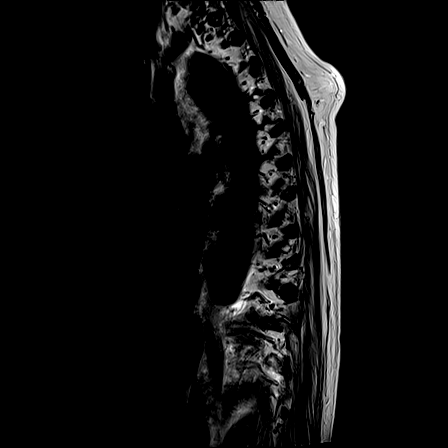
[im 8/15]
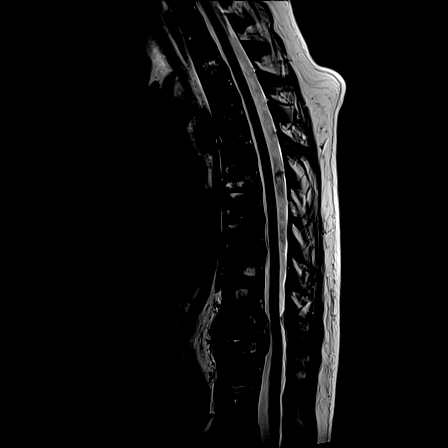
[im 15/15]
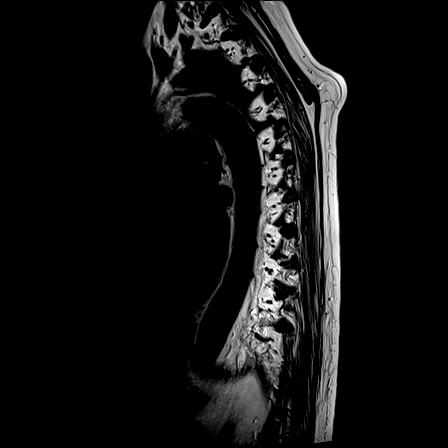

[Series 21: T2 · axial · 4.0mm · 0.35mm/px · z∈[-290,-77]mm · 7 of 44 slices shown (2 of 4)]
[im 1/44]
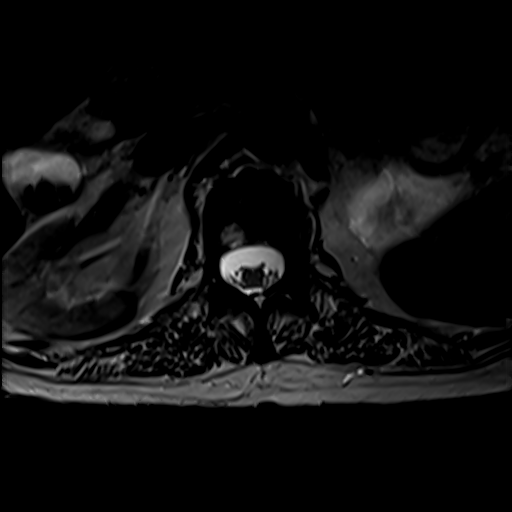
[im 8/44]
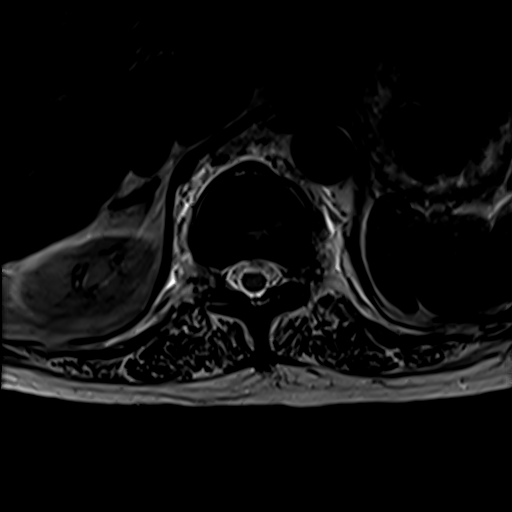
[im 15/44]
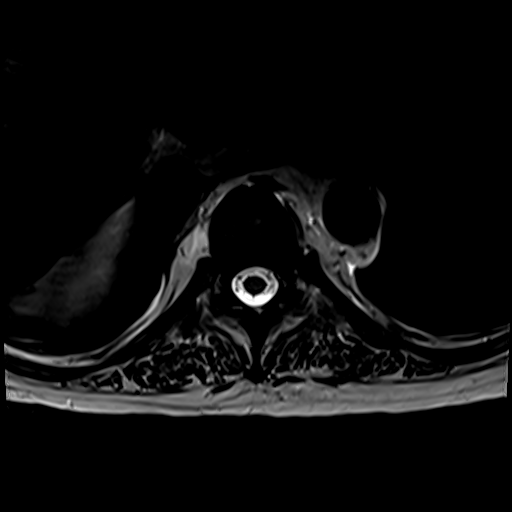
[im 22/44]
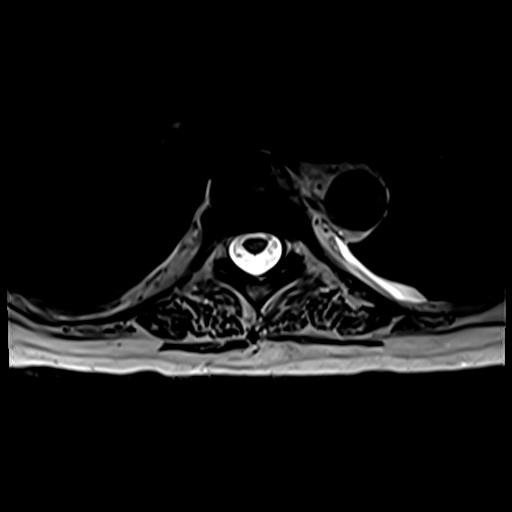
[im 29/44]
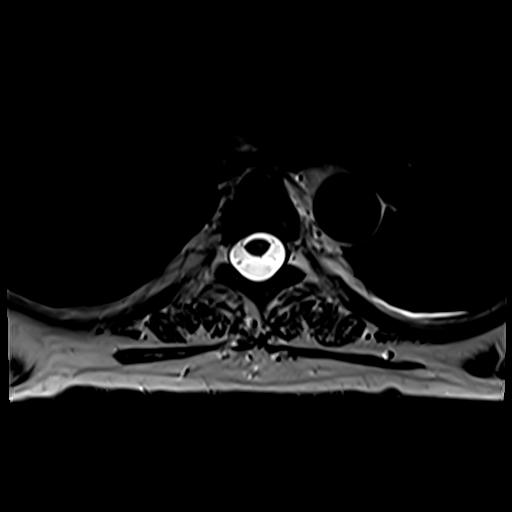
[im 36/44]
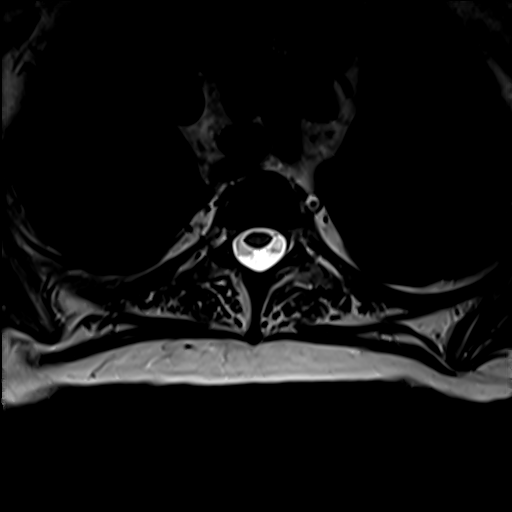
[im 44/44]
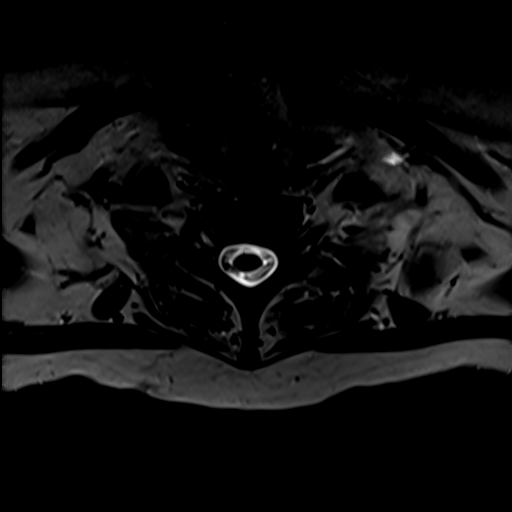

[Series 28: T2 · sagittal · 4.0mm · 0.73mm/px · 3 of 15 slices shown (3 of 4)]
[im 1/15]
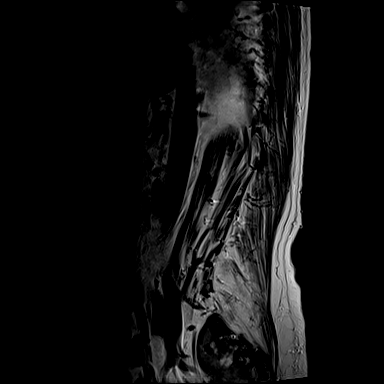
[im 8/15]
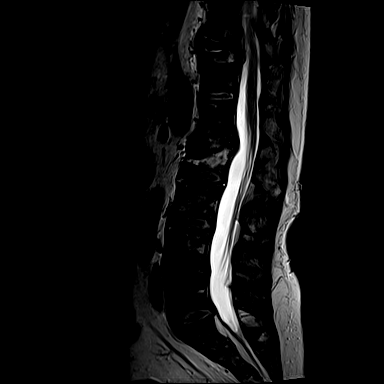
[im 15/15]
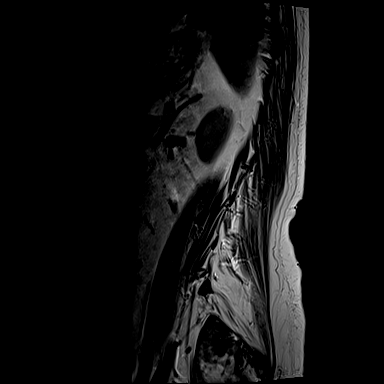

[Series 34: T2 · axial · 4.0mm · 0.28mm/px · z∈[-458,-296]mm · 5 of 39 slices shown (4 of 4)]
[im 1/39]
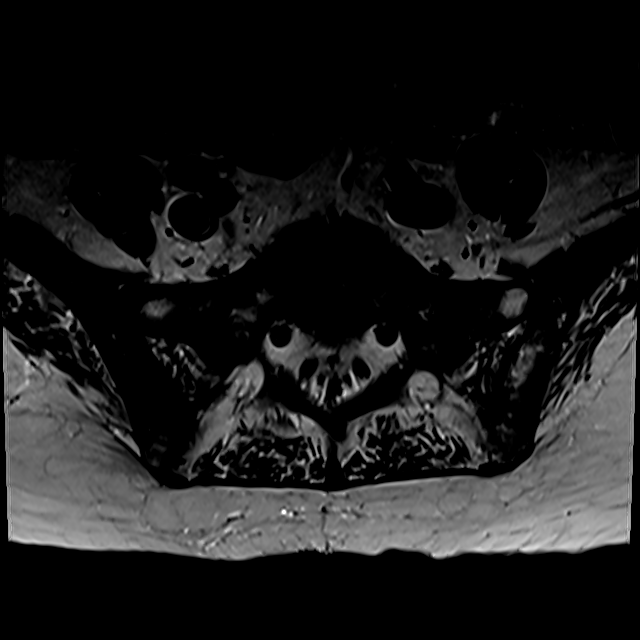
[im 7/39]
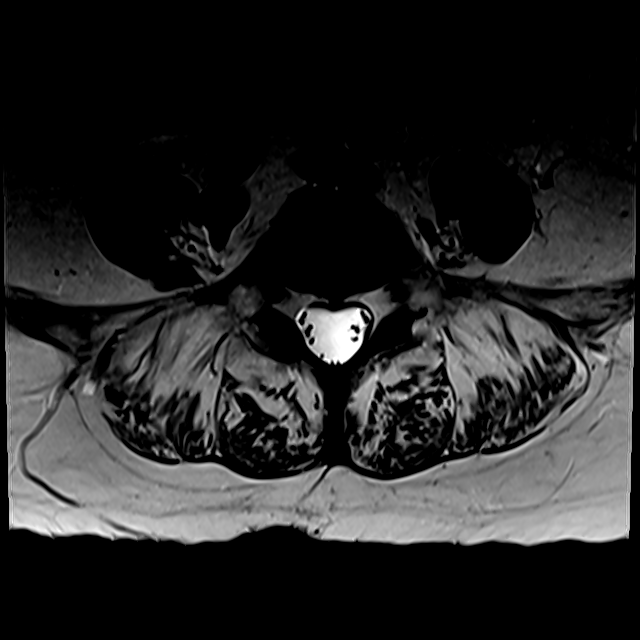
[im 13/39]
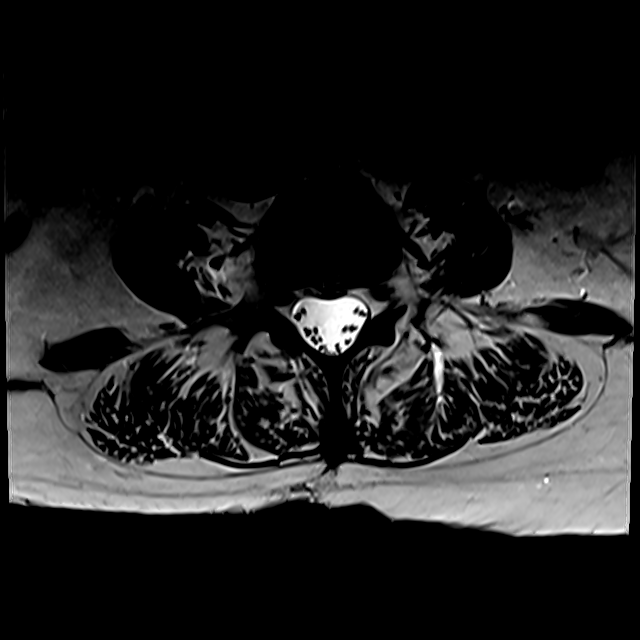
[im 20/39]
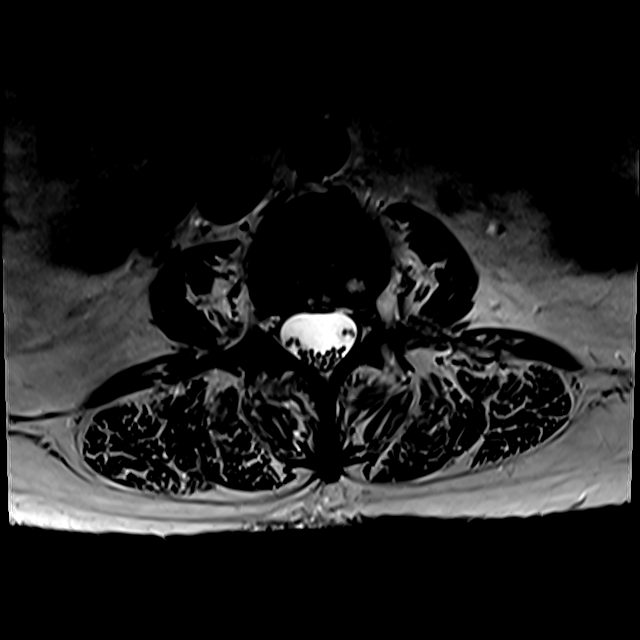
[im 32/39]
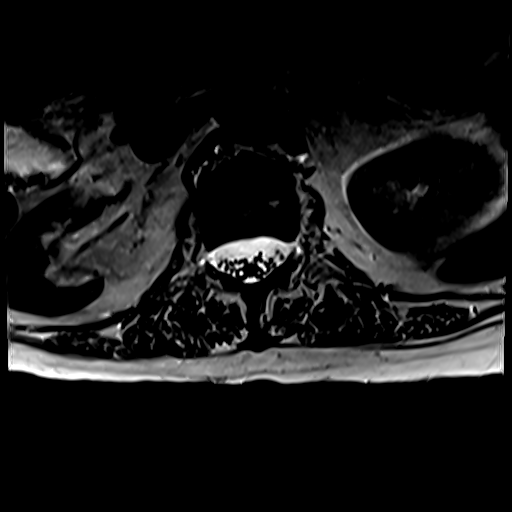

[18 of 48 positions shown; findings below may reference images not displayed]

FINDINGS: MRI THORACIC SPINE FINDINGS

Alignment:  Physiologic.

Vertebrae: There is persistent hypointense signal abnormality within
the T11 body, which has progressed into the posterior elements and
into the adjacent T10 and T12 vertebral bodies. There is spreading
into the T12 pedicles. Paravertebral infiltrative appearance on the
right mainly at T11. Patient had biopsy at time of vertebroplasty,
which was reportedly negative for malignancy. There is mild
edematous type signal around the T11 cement and in the paravertebral
soft tissues. The T11 body is posteriorly bulging, but no cord
impingement. Marrow is diffusely hypointense, without definite
lesion elsewhere.

Cord:  Normal signal and morphology.

Paraspinal and other soft tissues:  As above.

Disc levels:

Usual degenerative changes.  No degenerative impingement.

MRI LUMBAR SPINE FINDINGS

Segmentation:  Standard

Alignment:  Mild exaggerated kyphosis due to remote fracture.

Vertebrae: Remote L2 compression fracture with moderate height loss.
No acute fracture, discitis, or infiltrative lesion. Diffuse
hypointense marrow in this patient with polycythemia Jemba.

Conus medullaris: Extends to the L1-2 level and appears normal.

Paraspinal and other soft tissues: Negative

Disc levels:

No degenerative impingement.

These results will be called to the ordering clinician or
representative by the Radiologist Assistant, and communication
documented in the PACS or zVision Dashboard.
IMPRESSION: MR THORACIC SPINE IMPRESSION

1. Progressive infiltrative marrow lesion at the fractured and
treated T11 vertebral body, extending into the posterior elements,
right paravertebral soft tissues, and T10/T12 vertebrae. An
infiltrative leukemia or lymphoma is favored, consider repeat biopsy
or additional testing of the prior sample. Tuberculous or other
atypical infection is a less likely consideration, but microbiology
may be useful at any repeat biopsy.
2. T11 mild spinal stenosis without cord impingement.
3. Diffusely hypointense marrow in this patient with polycythemia
Jemba.

MR LUMBAR SPINE IMPRESSION

1. No acute or aggressive finding.
2. Remote L2 compression fracture

## 2017-07-08 ENCOUNTER — Encounter: Payer: Self-pay | Admitting: Family

## 2017-07-08 ENCOUNTER — Other Ambulatory Visit: Payer: Self-pay

## 2017-07-08 ENCOUNTER — Telehealth: Payer: Self-pay

## 2017-07-08 ENCOUNTER — Inpatient Hospital Stay: Payer: Medicare Other

## 2017-07-08 ENCOUNTER — Inpatient Hospital Stay: Payer: Medicare Other | Attending: Hematology & Oncology | Admitting: Family

## 2017-07-08 VITALS — BP 118/51

## 2017-07-08 VITALS — BP 118/60 | HR 82 | Temp 98.2°F | Resp 18 | Wt 144.0 lb

## 2017-07-08 DIAGNOSIS — D45 Polycythemia vera: Secondary | ICD-10-CM | POA: Diagnosis not present

## 2017-07-08 DIAGNOSIS — R5383 Other fatigue: Secondary | ICD-10-CM | POA: Diagnosis not present

## 2017-07-08 DIAGNOSIS — M8008XG Age-related osteoporosis with current pathological fracture, vertebra(e), subsequent encounter for fracture with delayed healing: Secondary | ICD-10-CM

## 2017-07-08 DIAGNOSIS — D500 Iron deficiency anemia secondary to blood loss (chronic): Secondary | ICD-10-CM

## 2017-07-08 DIAGNOSIS — M810 Age-related osteoporosis without current pathological fracture: Secondary | ICD-10-CM | POA: Diagnosis not present

## 2017-07-08 DIAGNOSIS — D508 Other iron deficiency anemias: Secondary | ICD-10-CM

## 2017-07-08 LAB — CBC WITH DIFFERENTIAL (CANCER CENTER ONLY)
Basophils Absolute: 0 10*3/uL (ref 0.0–0.1)
Basophils Relative: 0 %
Eosinophils Absolute: 0.1 10*3/uL (ref 0.0–0.5)
Eosinophils Relative: 1 %
HCT: 46 % (ref 34.8–46.6)
Hemoglobin: 15.5 g/dL (ref 11.6–15.9)
Lymphocytes Relative: 13 %
Lymphs Abs: 0.8 10*3/uL — ABNORMAL LOW (ref 0.9–3.3)
MCH: 35 pg — ABNORMAL HIGH (ref 26.0–34.0)
MCHC: 33.7 g/dL (ref 32.0–36.0)
MCV: 103.8 fL — ABNORMAL HIGH (ref 81.0–101.0)
Monocytes Absolute: 0.8 10*3/uL (ref 0.1–0.9)
Monocytes Relative: 13 %
Neutro Abs: 4.5 10*3/uL (ref 1.5–6.5)
Neutrophils Relative %: 73 %
Platelet Count: 133 10*3/uL — ABNORMAL LOW (ref 145–400)
RBC: 4.43 MIL/uL (ref 3.70–5.32)
RDW: 12.1 % (ref 11.1–15.7)
WBC Count: 6.3 10*3/uL (ref 3.9–10.0)

## 2017-07-08 LAB — CMP (CANCER CENTER ONLY)
ALT: 47 U/L (ref 10–47)
AST: 55 U/L — ABNORMAL HIGH (ref 11–38)
Albumin: 3.2 g/dL — ABNORMAL LOW (ref 3.5–5.0)
Alkaline Phosphatase: 74 U/L (ref 26–84)
Anion gap: 11 (ref 5–15)
BUN: 14 mg/dL (ref 7–22)
CO2: 26 mmol/L (ref 18–33)
Calcium: 9 mg/dL (ref 8.0–10.3)
Chloride: 102 mmol/L (ref 98–108)
Creatinine: 0.5 mg/dL — ABNORMAL LOW (ref 0.60–1.20)
Glucose, Bld: 101 mg/dL (ref 73–118)
Potassium: 3.6 mmol/L (ref 3.3–4.7)
Sodium: 139 mmol/L (ref 128–145)
Total Bilirubin: 1 mg/dL (ref 0.2–1.6)
Total Protein: 6.9 g/dL (ref 6.4–8.1)

## 2017-07-08 LAB — LACTATE DEHYDROGENASE: LDH: 226 U/L (ref 125–245)

## 2017-07-08 MED ORDER — ZOLEDRONIC ACID 4 MG/100ML IV SOLN
4.0000 mg | Freq: Once | INTRAVENOUS | Status: AC
  Administered 2017-07-08: 4 mg via INTRAVENOUS
  Filled 2017-07-08: qty 100

## 2017-07-08 MED ORDER — SODIUM CHLORIDE 0.9 % IV SOLN
INTRAVENOUS | Status: DC
  Administered 2017-07-08: 14:00:00 via INTRAVENOUS

## 2017-07-08 NOTE — Telephone Encounter (Signed)
One unit phlebotomy taken from left ac without incident.  End bag weight is 504 grams.  N/S infusing post procedure as ordered.  Ate ice cream and did well.

## 2017-07-08 NOTE — Progress Notes (Signed)
Hematology and Oncology Follow Up Visit  [name] [id number] [birth year] 72 y.o. 07/08/2017   Principle Diagnosis:  1. 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 Osteoporosis - with fracture at T11  Current Therapy:   1. Phlebotomy to maintain hematocrit below 45% 2. Aspirin 81 mg p.o. Daily. 3. IV iron as indicated - last received Dec/Jan x 2 4. Zometa IV every 6 months - next [email or web address]   Interim History:  [name] is here today with her husband for follow-up. She is feeling fatigued and having occasional palpitations. Hct is 46%.  Ferritin in April was 108 and iron saturation was 60%.  No fever, chills, n/v, cough, rash, dizziness, chest pain, abdominal pain or changes in bowel or bladder habits.  She has diarrhea regularly and uses an over the counter antidiarrheal as needed.  She has SOB at times due to asthma and is able to manage this effectively with her inhaler.  She fell yesterday (tripped on rug at home) and has bruising along her right arm. ROM is good, no other s/s of injury.  No episodes of bleeding, no petechiae.  No swelling, numbness or tingling in her extremities.  No lymphadenopathy noted on exam.  She has maintained a good appetite and has maintained a good appetite.   ECOG Performance Status: 1 - Symptomatic but completely ambulatory  Medications:  Allergies as of 07/08/2017      Reactions   Dulera [mometasone Furo-formoterol Fum] Cough      Medication List        Accurate as of 07/08/17  1:43 PM. Always use your most recent med list.          albuterol 108 (90 Base) MCG/ACT inhaler Commonly known as:  PROVENTIL HFA;VENTOLIN HFA Inhale 2 puffs into the lungs every 4 (four) hours as needed for wheezing or shortness of breath.   LOPREEZA 0.5-0.1 MG tablet Generic drug:   Estradiol-Norethindrone Acet Take 1 tablet by mouth daily.   LORazepam 0.5 MG tablet Commonly known as:  ATIVAN TAKE 1 TABLET BY MOUTH EVERY NIGHT AT BEDTIME   pantoprazole 40 MG tablet Commonly known as:  PROTONIX take 1 tablet by mouth daily before meals   PRESCRIPTION MEDICATION Take 1 tablet by mouth as needed (diarrhea). Old anti-diarrheal prescription, unsure of name.   sertraline 100 MG tablet Commonly known as:  ZOLOFT Take 100 mg by mouth daily.   SYMBICORT 80-4.5 MCG/ACT inhaler Generic drug:  budesonide-formoterol Inhale 2 puffs into the lungs 2 (two) times daily.   Vitamin D (Ergocalciferol) 50000 units Caps capsule Commonly known as:  DRISDOL Take 1 capsule (50,000 Units total) by mouth every 7 (seven) days. Sundays       Allergies:  Allergies  Allergen Reactions  . Dulera [Mometasone Furo-Formoterol Fum] Cough    Past Medical History, Surgical history, Social history, and Family History were reviewed and updated.  Review of Systems: All other 10 point review of systems is negative.   Physical Exam:  weight is 144 lb (65.3 kg). Her oral temperature is 98.2 F (36.8 C). Her blood pressure is 118/60 and her pulse is 82. Her respiration is 18 and oxygen saturation is 96%.   Wt Readings from Last 3 Encounters:  07/08/17 144 lb (65.3 kg)  05/18/17 142 lb (64.4 kg)  03/31/17 141 lb 4 oz (64.1 kg)    Ocular: Sclerae unicteric, pupils equal, round and  reactive to light Ear-nose-throat: Oropharynx clear, dentition fair Lymphatic: No cervical, supraclavicular or axillary adenopathy Lungs no rales or rhonchi, good excursion bilaterally Heart regular rate and rhythm, no murmur appreciated Abd soft, nontender, positive bowel sounds, no liver or spleen tip palpated on exam, no fluid wave  MSK no focal spinal tenderness, no joint edema Neuro: non-focal, well-oriented, appropriate affect Breasts: Deferred   Lab Results  Component Value Date   WBC 6.3  07/08/2017   HGB 15.5 07/08/2017   HCT 46.0 07/08/2017   MCV 103.8 (H) 07/08/2017   PLT 133 (L) 07/08/2017   Lab Results  Component Value Date   FERRITIN 108 05/18/2017   IRON 163 (H) 05/18/2017   TIBC 274 05/18/2017   UIBC 111 05/18/2017   IRONPCTSAT 60 (H) 05/18/2017   Lab Results  Component Value Date   RETICCTPCT 1.5 06/12/2014   RBC 4.43 07/08/2017   RETICCTABS 65.9 06/12/2014   Lab Results  Component Value Date   KAPLAMBRATIO 0.91 04/27/2016   Lab Results  Component Value Date   IGGSERUM 897 04/27/2016   IGA 339 02/27/2014   IGMSERUM 132 04/27/2016   Lab Results  Component Value Date   MSPIKE Not Observed 04/27/2016     Chemistry      Component Value Date/Time   NA 142 05/18/2017 1340   NA 144 01/20/2017 1344   NA 138 01/17/2016 1356   K 3.8 05/18/2017 1340   K 3.6 01/20/2017 1344   K 4.1 01/17/2016 1356   CL 108 05/18/2017 1340   CL 107 01/20/2017 1344   CO2 25 05/18/2017 1340   CO2 25 01/20/2017 1344   CO2 25 01/17/2016 1356   BUN 15 05/18/2017 1340   BUN 12 01/20/2017 1344   BUN 12.3 01/17/2016 1356   CREATININE 0.60 05/18/2017 1340   CREATININE 0.7 01/20/2017 1344   CREATININE 0.8 01/17/2016 1356      Component Value Date/Time   CALCIUM 9.7 05/18/2017 1340   CALCIUM 8.9 01/20/2017 1344   CALCIUM 9.1 01/17/2016 1356   ALKPHOS 73 05/18/2017 1340   ALKPHOS 68 01/20/2017 1344   ALKPHOS 87 01/17/2016 1356   AST 47 (H) 05/18/2017 1340   AST 48 (H) 01/17/2016 1356   ALT 42 05/18/2017 1340   ALT 49 (H) 01/20/2017 1344   ALT 38 01/17/2016 1356   BILITOT 1.2 05/18/2017 1340   BILITOT 0.67 01/17/2016 1356      Impression and Plan: [name] is a very pleasant 72 yo caucasian female with both hemochromatosis and polycythemia. She is symptomatic with fatigue and occasional palpitations. Hct is 46%.  We will phlebotomize her today and give her replacement fluids as well as Zometa.  We will plan to see her back in another 2 months for follow-up.    They will contact our office with any questions or concerns. We can certainly see her sooner if need be.   Laverna Peace, NP 6/13/20191:43 PM

## 2017-07-08 NOTE — Patient Instructions (Signed)
Therapeutic Phlebotomy, Care After Refer to this sheet in the next few weeks. These instructions provide you with information about caring for yourself after your procedure. Your health care provider may also give you more specific instructions. Your treatment has been planned according to current medical practices, but problems sometimes occur. Call your health care provider if you have any problems or questions after your procedure. What can I expect after the procedure? After the procedure, it is common to have:  Light-headedness or dizziness. You may feel faint.  Nausea.  Tiredness.  Follow these instructions at home: Activity  Return to your normal activities as directed by your health care provider. Most people can go back to their normal activities right away.  Avoid strenuous physical activity and heavy lifting or pulling for about 5 hours after the procedure. Do not lift anything that is heavier than 10 lb (4.5 kg).  Athletes should avoid strenuous exercise for at least 12 hours.  Change positions slowly for the remainder of the day. This will help to prevent light-headedness or fainting.  If you feel light-headed, lie down until the feeling goes away. Eating and drinking  Be sure to eat well-balanced meals for the next 24 hours.  Drink enough fluid to keep your urine clear or pale yellow.  Avoid drinking alcohol on the day that you had the procedure. Care of the Needle Insertion Site  Keep your bandage dry. You can remove the bandage after about 5 hours or as directed by your health care provider.  If you have bleeding from the needle insertion site, elevate your arm and press firmly on the site until the bleeding stops.  If you have bruising at the site, apply ice to the area: ? Put ice in a plastic bag. ? Place a towel between your skin and the bag. ? Leave the ice on for 20 minutes, 2-3 times a day for the first 24 hours.  If the swelling does not go away after  24 hours, apply a warm, moist washcloth to the area for 20 minutes, 2-3 times a day. General instructions  Avoid smoking for at least 30 minutes after the procedure.  Keep all follow-up visits as directed by your health care provider. It is important to continue with further therapeutic phlebotomy treatments as directed. Contact a health care provider if:  You have redness, swelling, or pain at the needle insertion site.  You have fluid, blood, or pus coming from the needle insertion site.  You feel light-headed, dizzy, or nauseated, and the feeling does not go away.  You notice new bruising at the needle insertion site.  You feel weaker than normal.  You have a fever or chills. Get help right away if:  You have severe nausea or vomiting.  You have chest pain.  You have trouble breathing. This information is not intended to replace advice given to you by your health care provider. Make sure you discuss any questions you have with your health care provider. Document Released: 06/16/2010 Document Revised: 09/14/2015 Document Reviewed: 01/08/2014 Elsevier Interactive Patient Education  2018 [place]. Zoledronic Acid injection (Hypercalcemia, Oncology) What is this medicine? ZOLEDRONIC ACID (ZOE le dron ik AS id) lowers the amount of calcium loss from bone. It is used to treat too much calcium in your blood from cancer. It is also used to prevent complications of cancer that has spread to the bone. This medicine may be used for other purposes; ask your health care provider or pharmacist if  you have questions. COMMON BRAND NAME(S): Zometa What should I tell my health care provider before I take this medicine? They need to know if you have any of these conditions: -aspirin-sensitive asthma -cancer, especially if you are receiving medicines used to treat cancer -dental disease or wear dentures -infection -kidney disease -receiving corticosteroids like dexamethasone or  prednisone -an unusual or allergic reaction to zoledronic acid, other medicines, foods, dyes, or preservatives -pregnant or trying to get pregnant -breast-feeding How should I use this medicine? This medicine is for infusion into a vein. It is given by a health care professional in a hospital or clinic setting. Talk to your pediatrician regarding the use of this medicine in children. Special care may be needed. Overdosage: If you think you have taken too much of this medicine contact a poison control center or emergency room at once. NOTE: This medicine is only for you. Do not share this medicine with others. What if I miss a dose? It is important not to miss your dose. Call your doctor or health care professional if you are unable to keep an appointment. What may interact with this medicine? -certain antibiotics given by injection -NSAIDs, medicines for pain and inflammation, like ibuprofen or naproxen -some diuretics like bumetanide, furosemide -teriparatide -thalidomide This list may not describe all possible interactions. Give your health care provider a list of all the medicines, herbs, non-prescription drugs, or dietary supplements you use. Also tell them if you smoke, drink alcohol, or use illegal drugs. Some items may interact with your medicine. What should I watch for while using this medicine? Visit your doctor or health care professional for regular checkups. It may be some time before you see the benefit from this medicine. Do not stop taking your medicine unless your doctor tells you to. Your doctor may order blood tests or other tests to see how you are doing. Women should inform their doctor if they wish to become pregnant or think they might be pregnant. There is a potential for serious side effects to an unborn child. Talk to your health care professional or pharmacist for more information. You should make sure that you get enough calcium and vitamin D while you are taking this  medicine. Discuss the foods you eat and the vitamins you take with your health care professional. Some people who take this medicine have severe bone, joint, and/or muscle pain. This medicine may also increase your risk for jaw problems or a broken thigh bone. Tell your doctor right away if you have severe pain in your jaw, bones, joints, or muscles. Tell your doctor if you have any pain that does not go away or that gets worse. Tell your dentist and dental surgeon that you are taking this medicine. You should not have major dental surgery while on this medicine. See your dentist to have a dental exam and fix any dental problems before starting this medicine. Take good care of your teeth while on this medicine. Make sure you see your dentist for regular follow-up appointments. What side effects may I notice from receiving this medicine? Side effects that you should report to your doctor or health care professional as soon as possible: -allergic reactions like skin rash, itching or hives, swelling of the face, lips, or tongue -anxiety, confusion, or depression -breathing problems -changes in vision -eye pain -feeling faint or lightheaded, falls -jaw pain, especially after dental work -mouth sores -muscle cramps, stiffness, or weakness -redness, blistering, peeling or loosening of the skin, including inside  the mouth -trouble passing urine or change in the amount of urine Side effects that usually do not require medical attention (report to your doctor or health care professional if they continue or are bothersome): -bone, joint, or muscle pain -constipation -diarrhea -fever -hair loss -irritation at site where injected -loss of appetite -nausea, vomiting -stomach upset -trouble sleeping -trouble swallowing -weak or tired This list may not describe all possible side effects. Call your doctor for medical advice about side effects. You may report side effects to FDA at 1-800-FDA-1088. Where  should I keep my medicine? This drug is given in a hospital or clinic and will not be stored at home. NOTE: This sheet is a summary. It may not cover all possible information. If you have questions about this medicine, talk to your doctor, pharmacist, or health care provider.  2018 Elsevier/Gold Standard (2013-06-10 14:19:39)

## 2017-07-09 LAB — IRON AND TIBC
Iron: 125 ug/dL (ref 41–142)
Saturation Ratios: 46 % (ref 21–57)
TIBC: 273 ug/dL (ref 236–444)
UIBC: 148 ug/dL

## 2017-07-09 LAB — FERRITIN: Ferritin: 137 ng/mL (ref 9–269)

## 2017-07-12 ENCOUNTER — Telehealth: Payer: Self-pay | Admitting: Emergency Medicine

## 2017-07-12 ENCOUNTER — Ambulatory Visit (INDEPENDENT_AMBULATORY_CARE_PROVIDER_SITE_OTHER)
Admission: RE | Admit: 2017-07-12 | Discharge: 2017-07-12 | Disposition: A | Discharge status: Home / Self Care | Payer: Medicare Other | Source: Ambulatory Visit | Attending: Internal Medicine | Admitting: Internal Medicine

## 2017-07-12 ENCOUNTER — Ambulatory Visit (INDEPENDENT_AMBULATORY_CARE_PROVIDER_SITE_OTHER): Payer: Medicare Other | Admitting: Internal Medicine

## 2017-07-12 ENCOUNTER — Encounter: Payer: Self-pay | Admitting: Internal Medicine

## 2017-07-12 VITALS — BP 108/64 | HR 100 | Temp 97.5°F | Ht 63.0 in | Wt 140.0 lb

## 2017-07-12 DIAGNOSIS — J441 Chronic obstructive pulmonary disease with (acute) exacerbation: Secondary | ICD-10-CM

## 2017-07-12 DIAGNOSIS — R0602 Shortness of breath: Secondary | ICD-10-CM | POA: Diagnosis not present

## 2017-07-12 DIAGNOSIS — R05 Cough: Secondary | ICD-10-CM | POA: Diagnosis not present

## 2017-07-12 MED ORDER — AZITHROMYCIN 250 MG PO TABS
ORAL_TABLET | ORAL | 0 refills | Status: DC
Start: 2017-07-12 — End: 2017-09-07

## 2017-07-12 MED ORDER — SYMBICORT 80-4.5 MCG/ACT IN AERO: 2.0000 | INHALATION_SPRAY | Freq: Two times a day (BID) | RESPIRATORY_TRACT | 1 refills | Status: DC

## 2017-07-12 MED ORDER — PREDNISONE 10 MG PO TABS
ORAL_TABLET | ORAL | 0 refills | Status: DC
Start: 2017-07-12 — End: 2017-09-07

## 2017-07-12 NOTE — Progress Notes (Signed)
Subjective:    Patient ID: [name], female    DOB: 1945-07-22    MRN: [id number]       ROV 07/22/16  Per [name]:  patient has a history of severe obstructive lung disease with a bronchodilator response on spirometry. We have been managing her with Symbicort since January, using 1 puffs bid instead of 2. She was dealing with severe cough through the Spring. Then she had thoracic compression fractures so her exercise has been minimal. Difficult to gage her exercise tolerance right now. She is concerned about the steroid dose, interested in decreasing to 80/4.5. Rarely needs albuterol. No flares.  rec After you have finished with your current Symbicort (160/4.5 g, one puff twice a day) we will change your prescription to the Symbicort 80/4.5 g dose so that you can use 2 puffs twice a day. Keep albuterol available to use as needed for shortness of breath Slowly work on increasing your exercise and stamina Follow with Dr [name] in 3 months or sooner if you have any problems    07/12/2017 acute extended ov/[name] re:  Severe chronic airflow obst never smoker prev chararacterized as COPD with reversibility by [name] Chief Complaint  Patient presents with  . Acute Visit    Increased SOB, wheezing, chest tightness and prod cough with yellow sputum x 3 days. She had to use her rescue inhaler x 2 in the past couple of days and she states this is not like her.   Baseline was better on 80 2 bid  Rare albuterol and sleeps fine taking ppi qd before supper / limited more by strength than sob   Acutely worse x 3 daysp caught husband's  head cold >> rapidly spread to chest assoc with sore throat only used saba twice since onset   No obvious day to day or daytime variability or assoc  mucus plugs or hemoptysis or cp or chest tightness, subjective wheeze or overt sinus or hb symptoms. No unusual exposure hx or h/o childhood pna/ asthma or knowledge of premature birth.  Sleeping poorly since onset of  cough even on several pillows, no better on nyquil.   Denies any obvious fluctuation of symptoms with weather or environmental changes or other aggravating or alleviating factors except as outlined above   Current Allergies, Complete Past Medical History, Past Surgical History, Family History, and Social History were reviewed in [employer] record.  ROS  The following are not active complaints unless bolded Hoarseness, sore throat, dysphagia, dental problems, itching, sneezing,  nasal congestion or discharge of excess mucus or purulent secretions, ear ache,   fever, chills, sweats, unintended wt loss or wt gain, classically pleuritic or exertional cp,  orthopnea pnd or arm/hand swelling  or leg swelling, presyncope, palpitations, abdominal pain, anorexia, nausea, vomiting, diarrhea  or change in bowel habits or change in bladder habits, change in stools or change in urine, dysuria, hematuria,  rash, arthralgias, visual complaints, headache, numbness, weakness or ataxia or problems with walking or coordination,  change in mood or  memory.        Current Meds  Medication Sig  . albuterol (PROVENTIL HFA;VENTOLIN HFA) 108 (90 Base) MCG/ACT inhaler Inhale 2 puffs into the lungs every 4 (four) hours as needed for wheezing or shortness of breath.  . Estradiol-Norethindrone Acet (LOPREEZA) 0.5-0.1 MG per tablet Take 1 tablet by mouth daily.  Marland Kitchen LORazepam (ATIVAN) 0.5 MG tablet TAKE 1 TABLET BY MOUTH EVERY NIGHT AT BEDTIME  . pantoprazole (PROTONIX)  40 MG tablet take 1 tablet by mouth daily before meals  . PRESCRIPTION MEDICATION Take 1 tablet by mouth as needed (diarrhea). Old anti-diarrheal prescription, unsure of name.  . sertraline (ZOLOFT) 100 MG tablet Take 100 mg by mouth daily.  . SYMBICORT 80-4.5 MCG/ACT inhaler Inhale 2 puffs into the lungs 2 (two) times daily.  . Vitamin D, Ergocalciferol, (DRISDOL) 50000 units CAPS capsule Take 1 capsule (50,000 Units total) by mouth every 7  (seven) days. Sundays  .                         Objective:    Partial dentures   W/c bound wf congested cough o/w nad  Wt Readings from Last 3 Encounters:  07/12/17 140 lb (63.5 kg)  07/08/17 144 lb (65.3 kg)  05/18/17 142 lb (64.4 kg)     Vital signs reviewed - Note on arrival 02 sats  94% on RA   HEENT: nl  turbinates bilaterally, and oropharynx. Nl external ear canals without cough reflex   NECK :  without JVD/Nodes/TM/ nl carotid upstrokes bilaterally   LUNGS: no acc muscle use,  Nl contour chest with end exp rhonchi bilaterally and cough on exp    CV:  RRR  no s3 or murmur or increase in P2, and no edema   ABD:  soft and nontender with nl inspiratory excursion in the supine position. No bruits or organomegaly appreciated, bowel sounds nl  MS:    ext warm without deformities, calf tenderness, cyanosis or clubbing No obvious joint restrictions   SKIN: warm and dry without lesions    NEURO:  alert, approp, nl sensorium with  no motor or cerebellar deficits apparent.        CXR PA and Lateral:   07/12/2017 :    I personally reviewed images and agree with radiology impression as follows:   No acute or focal cardiopulmonary abnormality.          Assessment & Plan:

## 2017-07-12 NOTE — Patient Instructions (Addendum)
Continue Symbicort 80 Take 2 puffs first thing in am and then another 2 puffs about 12 hours later.    Work on inhaler technique:  relax and gently blow all the way out then take a nice smooth deep breath back in, triggering the inhaler at same time you start breathing in.  Hold for up to 5 seconds if you can. Blow out thru nose. Rinse and gargle with water when done    If coughing >   mucinex dm up to 1200 mg every 12 hours if needed and take pantoprazole (protonix ) Take 30- 60 min before your first and last meals of the day   GERD (REFLUX)  is an extremely common cause of respiratory symptoms just like yours , many times with no obvious heartburn at all.    It can be treated with medication, but also with lifestyle changes including elevation of the head of your bed (ideally with 6 inch  bed blocks),  Smoking cessation, avoidance of late meals, excessive alcohol, and avoid fatty foods, chocolate, peppermint, colas, red wine, and acidic juices such as orange juice.  NO MINT OR MENTHOL PRODUCTS SO NO COUGH DROPS  USE SUGARLESS CANDY INSTEAD (Jolley ranchers or Stover's or Life Savers) or even ice chips will also do - the key is to swallow to prevent all throat clearing. NO OIL BASED VITAMINS - use powdered substitutes.     If mucus  Nasty or run fever >  Take Zpak    If breathing worse  > prednisone  Prednisone 10 mg take  4 each am x 2 days,   2 each am x 2 days,  1 each am x 2 days and stop    Please remember to go to the  x-ray department downstairs in the basement  for your tests - we will call you with the results when they are available.  Call for follow up in 1 week if no better - call sooner if condition worsens in any way

## 2017-07-12 NOTE — Telephone Encounter (Signed)
Spoke with pt . She has been scheduled with Dr. Melvyn Novas today at 1:30pm. Nothing further was needed.

## 2017-07-12 NOTE — Assessment & Plan Note (Addendum)
PFTs 01/23/2013:  FEV1 0.88 (39%), FEV1% 54, FEV1 increased 33% with BD, no restriction, DLCO 53% AAT 01/2013: normal level, MM  - 07/12/2017  After extensive coaching inhaler device  effectiveness =    50% from a baseline of 25%   Acute flare of cough in setting of likely viral uri from husband and so far has only used saba twice with some benefit even though hfa so poor and on ppi qpm whereas was instructed to use bid ac.  I think it's much more likely she has asthma with an irreversible component than copd with a large reversible component since she never smoked  but the point is moot for today's purposes and her described baseline is more limited by muscle weakness/ debilitation than ventilatory reserve so no need to change her maint rx either.  rec Contingencies reviewed re how to manage acute exac of AB as follows:  For purulent sputum/ fever > zpak For cough/ congesiton> max gerd rx/ diet and mucinex dm as long as actively coughing  If breathing worse > Prednisone 10 mg take  4 each am x 2 days,   2 each am x 2 days,  1 each am x 2 days and stop    I had an extended discussion with the patient and husband  reviewing all relevant studies completed to date and  lasting 25 minutes of a 40  minute acute office visit with pt new to me    re  severe non-specific but potentially very serious refractory respiratory symptoms of uncertain and potentially multiple  etiologies.  See device teaching which extended face to face time for this visit  Each maintenance medication was reviewed in detail including most importantly the difference between maintenance and prns and under what circumstances the prns are to be triggered using an action plan format that is not reflected in the computer generated alphabetically organized AVS.    Please see AVS for specific instructions unique to this office visit that I personally wrote and verbalized to the the pt in detail and then reviewed with pt  by my nurse  highlighting any changes in therapy/plan of care  recommended at today's visit.

## 2017-07-13 ENCOUNTER — Encounter: Payer: Self-pay | Admitting: Internal Medicine

## 2017-09-01 DIAGNOSIS — R1084 Generalized abdominal pain: Secondary | ICD-10-CM | POA: Diagnosis not present

## 2017-09-01 DIAGNOSIS — R197 Diarrhea, unspecified: Secondary | ICD-10-CM | POA: Diagnosis not present

## 2017-09-01 DIAGNOSIS — Z6824 Body mass index (BMI) 24.0-24.9, adult: Secondary | ICD-10-CM | POA: Diagnosis not present

## 2017-09-01 DIAGNOSIS — E876 Hypokalemia: Secondary | ICD-10-CM | POA: Diagnosis not present

## 2017-09-03 ENCOUNTER — Telehealth: Payer: Self-pay | Admitting: Gastroenterology

## 2017-09-03 NOTE — Telephone Encounter (Signed)
Patient calling stating she is having a lot of IBS and diarrhea symptoms. Patient is wanting some advice. Former Dr.Brodie pt and patient has not seen Dr.Armbruster before, but he did order labs for her in 2017. Please advise if this needs to be routed else where.

## 2017-09-03 NOTE — Telephone Encounter (Signed)
Patient called she had yellowish, liquid stool. This is now starting to resolve, but is having diarrhea. She saw her PCP who prescribed Xifaxin, patient did not want to take this medicaiton. Patient is a former Dr. Olevia Perches patient and has not been seen since 2013. I have scheduled her to come in for evaluation on 8/13 with APP.

## 2017-09-06 DIAGNOSIS — S20162A Insect bite (nonvenomous) of breast, left breast, initial encounter: Secondary | ICD-10-CM | POA: Diagnosis not present

## 2017-09-06 DIAGNOSIS — L0889 Other specified local infections of the skin and subcutaneous tissue: Secondary | ICD-10-CM | POA: Diagnosis not present

## 2017-09-07 ENCOUNTER — Ambulatory Visit (INDEPENDENT_AMBULATORY_CARE_PROVIDER_SITE_OTHER): Payer: Medicare Other | Admitting: Physician Assistant

## 2017-09-07 ENCOUNTER — Encounter: Payer: Self-pay | Admitting: Physician Assistant

## 2017-09-07 VITALS — BP 108/54 | HR 84 | Ht 63.0 in | Wt 140.6 lb

## 2017-09-07 DIAGNOSIS — Z86010 Personal history of colonic polyps: Secondary | ICD-10-CM

## 2017-09-07 DIAGNOSIS — R194 Change in bowel habit: Secondary | ICD-10-CM | POA: Diagnosis not present

## 2017-09-07 DIAGNOSIS — R1030 Lower abdominal pain, unspecified: Secondary | ICD-10-CM

## 2017-09-07 MED ORDER — NA SULFATE-K SULFATE-MG SULF 17.5-3.13-1.6 GM/177ML PO SOLN: 1.0000 | ORAL | 0 refills | Status: DC

## 2017-09-07 NOTE — Addendum Note (Signed)
Addended by: Wyline Beady on: 09/07/2017 04:01 PM   Modules accepted: Orders

## 2017-09-07 NOTE — Patient Instructions (Signed)

## 2017-09-07 NOTE — Progress Notes (Signed)
Chief Complaint: Diarrhea  HPI:    [name] is a 72 year old female with a past medical history of CKD and multiple others listed below, who previously followed with Dr. Olevia Perches and has now been assigned to Dr. Havery Moros, who presents to clinic today with a complaint of IBS.    08/07/2011 office visit Dr. Olevia Perches to discuss colonoscopy.  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    10/02/2011 colonoscopy with a sessile polyp 7 mm in size in the rectum.  Pathology showed tubular adenoma, repeat was recommended in 5 years.    09/03/2017 patient called our office and discussed that she was having diarrhea her primary care provider had wanted to prescribe Xifaxan but the patient did not want to take this medication.    Today, discusses that she usually has some diarrhea, typically will have a normal stool and about 30 minutes later will have loose stools this is been normal for her whole life but within the past week or so this increased and was so bad that at one point she was having just "yellow/clear stool which looked like urine"  which would come urgently.  Patient had multiple accidents.  She was using her dicyclomine 20 mg 4 times a day and still having this issue.  She did see her primary care provider who apparently did an x-ray and diagnosed her with overflow constipation.  Patient tells me that since that time she has had a decrease in symptoms and this morning did have a regular solid bowel movement with no diarrhea afterwards.  She is feeling much better overall.  Associated symptoms include lower abdominal cramping before bowel movement somewhat better afterwards, but it can also linger for about an hour.    Denies fever, chills, blood in her stool, weight loss, continued abdominal pain or symptoms that awaken her from sleep.     Past Medical History:  Diagnosis Date  . Allergy   . Anemia    . Anxiety   . Asthma    DR. BYRUM  . Blood transfusion without reported diagnosis   . Cholelithiasis   . Chronic kidney disease    gallstones   . Compression fracture of body of thoracic vertebra (HCC)   . Depression   . Dyspnea   . Dysrhythmia    palpitations  . GERD (gastroesophageal reflux disease)   . Heart attack ([place])   . Hemorrhoids   . IBS (irritable bowel syndrome)   . Iliotibial band syndrome    left knee  . Migraine   . Mitochondrial myopathy   . Normal coronary arteries    by cardiac catheterization performed by myself 02/25/01  . Osteoporosis   . Pericarditis    age 73  . Pneumothorax   . 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 PVC's (premature ventricular contractions)   . Vitamin D deficiency     Past Surgical History:  Procedure Laterality Date  . APPENDECTOMY    . CARDIAC CATHETERIZATION    . CATARACT EXTRACTION W/ INTRAOCULAR LENS  IMPLANT, BILATERAL    . CESAREAN SECTION     x 4  . COLONOSCOPY    . DILATATION & CURETTAGE/HYSTEROSCOPY WITH MYOSURE N/A 10/29/2014   Procedure: DILATATION & CURETTAGE/HYSTEROSCOPY WITH MYOSURE;  Surgeon: Paula Compton, MD;  Location: [facility];  Service: Gynecology;  Laterality: N/A;  . IR FLUORO GUIDED NEEDLE PLC ASPIRATION/INJECTION LOC  06/10/2016  . IR FLUORO GUIDED  NEEDLE PLC ASPIRATION/INJECTION LOC  06/10/2016  . KYPHOPLASTY N/A 05/14/2016   Procedure: T 11 KYPHOPLASTY;  Surgeon: Phylliss Bob, MD;  Location: [facility];  Service: Orthopedics;  Laterality: N/A;  T 11 KYPHOPLASTY  . KYPHOPLASTY N/A 06/18/2016   Procedure: THORACIC 12 KYPHOPLASTY;  Surgeon: Phylliss Bob, MD;  Location: [facility];  Service: Orthopedics;  Laterality: N/A;  THORACIC 12 KYPHOPLASTY; REQUEST 1 HOUR AND FLIP ROOM  . MOUTH SURGERY    . NASAL SINUS SURGERY    . NM MYOCAR PERF WALL MOTION  04/27/2006   normal  . US ECHOCARDIOGRAPHY  12/25/2010   normal  . VEIN SURGERY      Current Outpatient Medications  Medication Sig Dispense Refill  .  albuterol (PROVENTIL HFA;VENTOLIN HFA) 108 (90 Base) MCG/ACT inhaler Inhale 2 puffs into the lungs every 4 (four) hours as needed for wheezing or shortness of breath. 1 Inhaler 4  . azithromycin (ZITHROMAX) 250 MG tablet Take 2 on day one then 1 daily x 4 days 6 tablet 0  . Estradiol-Norethindrone Acet (LOPREEZA) 0.5-0.1 MG per tablet Take 1 tablet by mouth daily.    Marland Kitchen LORazepam (ATIVAN) 0.5 MG tablet TAKE 1 TABLET BY MOUTH EVERY NIGHT AT BEDTIME 30 tablet 0  . pantoprazole (PROTONIX) 40 MG tablet take 1 tablet by mouth daily before meals    . predniSONE (DELTASONE) 10 MG tablet Take  4 each am x 2 days,   2 each am x 2 days,  1 each am x 2 days and stop 14 tablet 0  . PRESCRIPTION MEDICATION Take 1 tablet by mouth as needed (diarrhea). Old anti-diarrheal prescription, unsure of name.    . sertraline (ZOLOFT) 100 MG tablet Take 100 mg by mouth daily.  0  . SYMBICORT 80-4.5 MCG/ACT inhaler Inhale 2 puffs into the lungs 2 (two) times daily. 1 Inhaler 1  . Vitamin D, Ergocalciferol, (DRISDOL) 50000 units CAPS capsule Take 1 capsule (50,000 Units total) by mouth every 7 (seven) days. Sundays 30 capsule 3   No current facility-administered medications for this visit.     Allergies as of 09/07/2017 - Review Complete 07/13/2017  Allergen Reaction Noted  . Dulera [mometasone furo-formoterol fum] Cough 04/01/2015    Family History  Problem Relation Age of Onset  . Asthma Mother   . Arthritis Mother   . Depression Mother   . Cancer Father   . Emphysema Father   . Stroke Father   . Alcohol abuse Father   . Heart attack Father   . Colon cancer Neg Hx     Social History   Socioeconomic History  . Marital status: Married    Spouse name: Richard  . Number of children: 2  . Years of education: Grad  . Highest education level: Not on file  Occupational History  . Occupation: Engineer, manufacturing systems: RETIRED  Social Needs  . Financial resource strain: Not on file  . Food insecurity:     Worry: Not on file    Inability: Not on file  . Transportation needs:    Medical: Not on file    Non-medical: Not on file  Tobacco Use  . Smoking status: Never Smoker  . Smokeless tobacco: Never Used  . Tobacco comment: never used tobacco  Substance and Sexual Activity  . Alcohol use: Yes    Alcohol/week: 2.0 standard drinks    Types: 2 Glasses of wine per week    Comment: occasionally  . Drug use: No  .  Sexual activity: Not on file  Lifestyle  . Physical activity:    Days per week: Not on file    Minutes per session: Not on file  . Stress: Not on file  Relationships  . Social connections:    Talks on phone: Not on file    Gets together: Not on file    Attends religious service: Not on file    Active member of club or organization: Not on file    Attends meetings of clubs or organizations: Not on file    Relationship status: Not on file  . Intimate partner violence:    Fear of current or ex partner: Not on file    Emotionally abused: Not on file    Physically abused: Not on file    Forced sexual activity: Not on file  Other Topics Concern  . Not on file  Social History Narrative   Health Care POA:    Emergency Contact: husband, Francene Finders, (c) [phone]   End of Life Plan:    Who lives with you: husband   Any pets: none   Diet: Pt has a varied diet.  Eats 5 sm. meals throughout day, focuses on protein, doesn't care for fruits and vegetables very much.   Exercise: Pt has a personal training and exercises several times a week.   Seatbelts: Pt reports wearing seatbelt when in vehicle.   Nancy Fetter Exposure/Protection: Pt reports wearing sun protection.    Hobbies: reading, visiting with friends   Patient has a Scientist, water quality.   Patient has two children.   Patient is retired.   Patient does not drink any caffeine.   Patient is right handed.          Review of Systems:    Constitutional: No weight loss, fever or chills Skin: No rash  Cardiovascular: No chest  pain Respiratory: No SOB  Gastrointestinal: See HPI and otherwise negative Genitourinary: No dysuria  Neurological: No headache, dizziness or syncope Musculoskeletal: No new muscle or joint pain Hematologic: No bleeding Psychiatric: No history of depression or anxiety   Physical Exam:  Vital signs: BP (!) 108/54   Pulse 84   Ht 5\' 3"  (1.6 m)   Wt 140 lb 9.6 oz (63.8 kg)   BMI 24.91 kg/m   Constitutional:   Pleasant frail appearing Caucasian female appears to be in NAD, Well developed, Well nourished, alert and cooperative Head:  Normocephalic and atraumatic. Eyes:   PEERL, EOMI. No icterus. Conjunctiva pink. Ears:  Normal auditory acuity. Neck:  Supple Throat: Oral cavity and pharynx without inflammation, swelling or lesion.  Respiratory: Respirations even and unlabored. Lungs clear to auscultation bilaterally.   No wheezes, crackles, or rhonchi.  Cardiovascular: Normal S1, S2. No MRG. Regular rate and rhythm. No peripheral edema, cyanosis or pallor.  Gastrointestinal:  Soft, nondistended, nontender. No rebound or guarding. Normal bowel sounds. No appreciable masses or hepatomegaly. Rectal:  Not performed.  Msk:  Symmetrical without gross deformities. Without edema, no deformity or joint abnormality.  Neurologic:  Alert and  oriented x4;  grossly normal neurologically.  Skin:   Dry and intact without significant lesions or rashes. Psychiatric:  Demonstrates good judgement and reason without abnormal affect or behaviors.  MOST RECENT LABS AND IMAGING: CBC    Component Value Date/Time   WBC 6.3 07/08/2017 1325   WBC 8.9 02/28/2017 1525   RBC 4.43 07/08/2017 1325   HGB 15.5 07/08/2017 1325   HGB 15.5 01/20/2017 1344   HCT 46.0 07/08/2017 1325  HCT 44.2 01/20/2017 1344   PLT 133 (L) 07/08/2017 1325   PLT 142 (L) 01/20/2017 1344   MCV 103.8 (H) 07/08/2017 1325   MCV 103 (H) 01/20/2017 1344   MCH 35.0 (H) 07/08/2017 1325   MCHC 33.7 07/08/2017 1325   RDW 12.1 07/08/2017  1325   RDW 11.9 01/20/2017 1344   LYMPHSABS 0.8 (L) 07/08/2017 1325   LYMPHSABS 1.1 01/20/2017 1344   MONOABS 0.8 07/08/2017 1325   EOSABS 0.1 07/08/2017 1325   EOSABS 0.1 01/20/2017 1344   BASOSABS 0.0 07/08/2017 1325   BASOSABS 0.0 01/20/2017 1344    CMP     Component Value Date/Time   NA 139 07/08/2017 1325   NA 144 01/20/2017 1344   NA 138 01/17/2016 1356   K 3.6 07/08/2017 1325   K 3.6 01/20/2017 1344   K 4.1 01/17/2016 1356   CL 102 07/08/2017 1325   CL 107 01/20/2017 1344   CO2 26 07/08/2017 1325   CO2 25 01/20/2017 1344   CO2 25 01/17/2016 1356   GLUCOSE 101 07/08/2017 1325   GLUCOSE 108 01/20/2017 1344   BUN 14 07/08/2017 1325   BUN 12 01/20/2017 1344   BUN 12.3 01/17/2016 1356   CREATININE 0.50 (L) 07/08/2017 1325   CREATININE 0.7 01/20/2017 1344   CREATININE 0.8 01/17/2016 1356   CALCIUM 9.0 07/08/2017 1325   CALCIUM 8.9 01/20/2017 1344   CALCIUM 9.1 01/17/2016 1356   PROT 6.9 07/08/2017 1325   PROT 6.5 01/20/2017 1344   PROT 7.0 01/17/2016 1356   ALBUMIN 3.2 (L) 07/08/2017 1325   ALBUMIN 3.1 (L) 01/20/2017 1344   ALBUMIN 3.7 07/08/2016 1306   ALBUMIN 3.5 01/17/2016 1356   AST 55 (H) 07/08/2017 1325   AST 48 (H) 01/17/2016 1356   ALT 47 07/08/2017 1325   ALT 49 (H) 01/20/2017 1344   ALT 38 01/17/2016 1356   ALKPHOS 74 07/08/2017 1325   ALKPHOS 68 01/20/2017 1344   ALKPHOS 87 01/17/2016 1356   BILITOT 1.0 07/08/2017 1325   BILITOT 0.67 01/17/2016 1356   GFRNONAA >60 02/28/2017 1525   GFRAA >60 02/28/2017 1525    Assessment: 1.  History of tubular adenomas: Last colonoscopy in 2013 with recommendations for repeat in 5 years, patient is overdue 2.  Lower abdominal cramping: Prior to a diarrheal bowel movement, typically relieved within an hour afterwards, dicyclomine does help; most likely IBS 3.  Change in bowel habits: Did have a change to more diarrhea than normal, then diagnosed with overflow constipation by PCP, this has since  resolved  Plan: 1.  Patient is due for a surveillance colonoscopy due to her history of adenomatous polyps.  Scheduled patient for a surveillance colonoscopy with Dr. Silverio Decamp, as her schedule accommodated the patient's needs.  Did discuss risk, benefits, limitations and alternatives and the patient agrees to proceed.  The patient will continue to follow with her primary GI physician Dr. Havery Moros after time of procedure. 2.  Discussed that if the patient feels a need for continued Dicyclomine in the future due to abdominal cramping could discuss a decreased dose or Levsin. 3.  Discussed a high-fiber diet, 25-35 g/day with use of a fiber supplement such as Metamucil, Citrucel or Benefiber.  Also discussed increased water intake to at least 6-8 8 ounce glasses per day. 4.  Patient follow in clinic per recommendations from Dr. Silverio Decamp after time of procedure.  Ellouise Newer, PA-C [facility] 09/07/2017, 2:35 PM  Cc: Velna Hatchet, MD

## 2017-09-08 NOTE — Progress Notes (Signed)
Agree with assessment and plan as outlined.  

## 2017-09-09 ENCOUNTER — Telehealth: Payer: Self-pay

## 2017-09-09 ENCOUNTER — Inpatient Hospital Stay: Payer: Medicare Other

## 2017-09-09 ENCOUNTER — Other Ambulatory Visit: Payer: Self-pay

## 2017-09-09 ENCOUNTER — Encounter: Payer: Self-pay | Admitting: Family

## 2017-09-09 ENCOUNTER — Inpatient Hospital Stay: Payer: Medicare Other | Attending: Hematology & Oncology | Admitting: Family

## 2017-09-09 VITALS — BP 127/62 | HR 78 | Temp 98.1°F | Resp 16 | Wt 141.0 lb

## 2017-09-09 DIAGNOSIS — D45 Polycythemia vera: Secondary | ICD-10-CM

## 2017-09-09 DIAGNOSIS — D500 Iron deficiency anemia secondary to blood loss (chronic): Secondary | ICD-10-CM

## 2017-09-09 DIAGNOSIS — R2681 Unsteadiness on feet: Secondary | ICD-10-CM | POA: Diagnosis not present

## 2017-09-09 DIAGNOSIS — R5383 Other fatigue: Secondary | ICD-10-CM | POA: Insufficient documentation

## 2017-09-09 DIAGNOSIS — G713 Mitochondrial myopathy, not elsewhere classified: Secondary | ICD-10-CM

## 2017-09-09 DIAGNOSIS — Z7982 Long term (current) use of aspirin: Secondary | ICD-10-CM | POA: Insufficient documentation

## 2017-09-09 LAB — CBC WITH DIFFERENTIAL (CANCER CENTER ONLY)
Basophils Absolute: 0 10*3/uL (ref 0.0–0.1)
Basophils Relative: 1 %
Eosinophils Absolute: 0.1 10*3/uL (ref 0.0–0.5)
Eosinophils Relative: 1 %
HCT: 43.5 % (ref 34.8–46.6)
Hemoglobin: 14.5 g/dL (ref 11.6–15.9)
Lymphocytes Relative: 12 %
Lymphs Abs: 1 10*3/uL (ref 0.9–3.3)
MCH: 34.1 pg — ABNORMAL HIGH (ref 26.0–34.0)
MCHC: 33.3 g/dL (ref 32.0–36.0)
MCV: 102.4 fL — ABNORMAL HIGH (ref 81.0–101.0)
Monocytes Absolute: 0.8 10*3/uL (ref 0.1–0.9)
Monocytes Relative: 10 %
Neutro Abs: 6.1 10*3/uL (ref 1.5–6.5)
Neutrophils Relative %: 76 %
Platelet Count: 192 10*3/uL (ref 145–400)
RBC: 4.25 MIL/uL (ref 3.70–5.32)
RDW: 12.2 % (ref 11.1–15.7)
WBC Count: 8.1 10*3/uL (ref 3.9–10.0)

## 2017-09-09 LAB — CMP (CANCER CENTER ONLY)
ALT: 40 U/L (ref 0–44)
AST: 50 U/L — ABNORMAL HIGH (ref 15–41)
Albumin: 3.4 g/dL — ABNORMAL LOW (ref 3.5–5.0)
Alkaline Phosphatase: 77 U/L (ref 38–126)
Anion gap: 8 (ref 5–15)
BUN: 11 mg/dL (ref 8–23)
CO2: 27 mmol/L (ref 22–32)
Calcium: 9 mg/dL (ref 8.9–10.3)
Chloride: 106 mmol/L (ref 98–111)
Creatinine: 0.68 mg/dL (ref 0.44–1.00)
GFR, Est AFR Am: >60 mL/min (ref >60)
GFR, Estimated: >60 mL/min (ref >60)
Glucose, Bld: 95 mg/dL (ref 70–99)
Potassium: 4.5 mmol/L (ref 3.5–5.1)
Sodium: 141 mmol/L (ref 135–145)
Total Bilirubin: 0.7 mg/dL (ref 0.3–1.2)
Total Protein: 7 g/dL (ref 6.5–8.1)

## 2017-09-09 LAB — LACTATE DEHYDROGENASE: LDH: 200 U/L — ABNORMAL HIGH (ref 98–192)

## 2017-09-09 NOTE — Progress Notes (Signed)
Hematology and Oncology Follow Up Visit  [name] [id number] 11-29-1945 72 y.o. 09/09/2017   Principle Diagnosis:  1. 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 Osteoporosis - with fracture at T11  Current Therapy:   1. Phlebotomy to maintain hematocrit below 45% 2. Aspirin 81 mg p.o. Daily. 3. IV iron as indicated - last received Dec/Jan x 2 4. Zometa IV every 6 months - next [id number]   Interim History: [name] is here today with her husband for follow-up. She is having some fatigue and unsteadiness on her feet at times. She states that she had a bad bout of constipation last week and was finally able to have a BM after taking a laxative. She is scheduled to have a colonoscopy next week with GI for further evaluation.  Hct today is 43.35%.  No episodes of bleeding, no petechiae. Her skin is thin and taking 1 baby aspirin daily she bruises easily.  No lymphadenopathy noted on exam.  She has had no fever, chills, n/v, cough, rash, dizziness, SOB, chest pain, palpitations or changes in bladder habits.  No swelling, tenderness, numbness or tingling in her extremities.  She has chronic back pain which she states is stable and tolerable at this time.  She has a good appetite and is staying well hydrated. Her weight is stable.   ECOG Performance Status: 1 - Symptomatic but completely ambulatory  Medications:  Allergies as of 09/09/2017      Reactions   Dulera [mometasone Furo-formoterol Fum] Cough      Medication List        Accurate as of 09/09/17  2:25 PM. Always use your most recent med list.          albuterol 108 (90 Base) MCG/ACT inhaler Commonly known as:  PROVENTIL HFA;VENTOLIN HFA Inhale 2 puffs into the lungs every 4 (four) hours as needed for wheezing or shortness of breath.   LOPREEZA 0.5-0.1 MG  tablet Generic drug:  Estradiol-Norethindrone Acet Take 1 tablet by mouth daily.   LORazepam 0.5 MG tablet Commonly known as:  ATIVAN TAKE 1 TABLET BY MOUTH EVERY NIGHT AT BEDTIME   Na Sulfate-K Sulfate-Mg Sulf 17.5-3.13-1.6 GM/177ML Soln Take 1 kit by mouth as directed.   pantoprazole 40 MG tablet Commonly known as:  PROTONIX take 1 tablet by mouth daily before meals   sertraline 100 MG tablet Commonly known as:  ZOLOFT Take 100 mg by mouth daily.   SYMBICORT 80-4.5 MCG/ACT inhaler Generic drug:  budesonide-formoterol Inhale 2 puffs into the lungs 2 (two) times daily.   Vitamin D (Ergocalciferol) 50000 units Caps capsule Commonly known as:  DRISDOL Take 1 capsule (50,000 Units total) by mouth every 7 (seven) days. Sundays       Allergies:  Allergies  Allergen Reactions  . Dulera [Mometasone Furo-Formoterol Fum] Cough    Past Medical History, Surgical history, Social history, and Family History were reviewed and updated.  Review of Systems: All other 10 point review of systems is negative.   Physical Exam:  weight is 141 lb (64 kg). Her oral temperature is 98.1 F (36.7 C). Her blood pressure is 127/62 and her pulse is 78. Her respiration is 16 and oxygen saturation is 98%.   Wt Readings from Last 3 Encounters:  09/09/17 141 lb (64 kg)  09/07/17 140 lb 9.6 oz (63.8 kg)  07/12/17 140 lb (63.5 kg)    Ocular:  Sclerae unicteric, pupils equal, round and reactive to light Ear-nose-throat: Oropharynx clear, dentition fair Lymphatic: No cervical, supraclavicular or axillary adenopathy Lungs no rales or rhonchi, good excursion bilaterally Heart regular rate and rhythm, no murmur appreciated Abd soft, nontender, positive bowel sounds, no liver or spleen tip palpated on exam, no fluid wave  MSK no focal spinal tenderness, no joint edema Neuro: non-focal, well-oriented, appropriate affect Breasts: Deferred   Lab Results  Component Value Date   WBC 8.1 09/09/2017    HGB 14.5 09/09/2017   HCT 43.5 09/09/2017   MCV 102.4 (H) 09/09/2017   PLT 192 09/09/2017   Lab Results  Component Value Date   FERRITIN 137 07/08/2017   IRON 125 07/08/2017   TIBC 273 07/08/2017   UIBC 148 07/08/2017   IRONPCTSAT 46 07/08/2017   Lab Results  Component Value Date   RETICCTPCT 1.5 06/12/2014   RBC 4.25 09/09/2017   RETICCTABS 65.9 06/12/2014   Lab Results  Component Value Date   KAPLAMBRATIO 0.91 04/27/2016   Lab Results  Component Value Date   IGGSERUM 897 04/27/2016   IGA 339 02/27/2014   IGMSERUM 132 04/27/2016   Lab Results  Component Value Date   MSPIKE Not Observed 04/27/2016     Chemistry      Component Value Date/Time   NA 139 07/08/2017 1325   NA 144 01/20/2017 1344   NA 138 01/17/2016 1356   K 3.6 07/08/2017 1325   K 3.6 01/20/2017 1344   K 4.1 01/17/2016 1356   CL 102 07/08/2017 1325   CL 107 01/20/2017 1344   CO2 26 07/08/2017 1325   CO2 25 01/20/2017 1344   CO2 25 01/17/2016 1356   BUN 14 07/08/2017 1325   BUN 12 01/20/2017 1344   BUN 12.3 01/17/2016 1356   CREATININE 0.50 (L) 07/08/2017 1325   CREATININE 0.7 01/20/2017 1344   CREATININE 0.8 01/17/2016 1356      Component Value Date/Time   CALCIUM 9.0 07/08/2017 1325   CALCIUM 8.9 01/20/2017 1344   CALCIUM 9.1 01/17/2016 1356   ALKPHOS 74 07/08/2017 1325   ALKPHOS 68 01/20/2017 1344   ALKPHOS 87 01/17/2016 1356   AST 55 (H) 07/08/2017 1325   AST 48 (H) 01/17/2016 1356   ALT 47 07/08/2017 1325   ALT 49 (H) 01/20/2017 1344   ALT 38 01/17/2016 1356   BILITOT 1.0 07/08/2017 1325   BILITOT 0.67 01/17/2016 1356      Impression and Plan: [name] is a very pleasant 72 yo caucasian female with  Both hemochromatosis and polycythemia. She is doing well but still has some fatigue and unsteadiness on her feet at times.  Hct is stable at 43.5% so no phlebotomy needed this visit.  She will continue taking her baby aspirin daily.  We will see what her iron studies show and  bring her back in for infusion if needed.  We will plan to see her back in another 2 months for follow-up.  They will contact our office with any questions or concerns. We can certainly see her sooner if need be.   Laverna Peace, NP 8/15/20192:25 PM

## 2017-09-09 NOTE — Telephone Encounter (Signed)
I think she can have the colonoscopy as scheduled.

## 2017-09-09 NOTE — Telephone Encounter (Signed)
I had called pt back. Do you need more information from Dermatologist?

## 2017-09-09 NOTE — Telephone Encounter (Signed)
Pt calling today states that she has been diagnosed with a "staph" infection and placed on an antibiotic (states she dose not know the name of it) to take 3 times a day for 10 days. Pt is scheduled for colon next week and wants to know if she is ok to proceed with colon. Asked pt if she has MRSA and she does not know. As DOD please advise.

## 2017-09-09 NOTE — Telephone Encounter (Signed)
Called pt back and she states the infection is on her Left breast, the area is quite red, sore and itchy. She was seen by Derm for this. Please advise.

## 2017-09-09 NOTE — Telephone Encounter (Signed)
I need more information before knowing how to proceed. Please contact the clinical staff of the physician treating the infection for details.

## 2017-09-10 ENCOUNTER — Telehealth: Payer: Self-pay | Admitting: Physician Assistant

## 2017-09-10 LAB — FERRITIN: Ferritin: 39 ng/mL (ref 11–307)

## 2017-09-10 LAB — IRON AND TIBC
Iron: 42 ug/dL (ref 41–142)
Saturation Ratios: 14 % — ABNORMAL LOW (ref 21–57)
TIBC: 298 ug/dL (ref 236–444)
UIBC: 255 ug/dL

## 2017-09-10 NOTE — Telephone Encounter (Signed)
The pt was advised to call the prescriber of the abx and make them aware and get req's

## 2017-09-10 NOTE — Telephone Encounter (Signed)
Patient states she is on an antibiotic for a staph infection, but the antibiotic is giving her diarrhea. Patient wanting to know if there is something to stop the diarrhea that will not affect her getting her colon procedure next week.

## 2017-09-10 NOTE — Telephone Encounter (Signed)
Spoke with pt and she is aware.

## 2017-09-16 ENCOUNTER — Ambulatory Visit (AMBULATORY_SURGERY_CENTER): Payer: Medicare Other | Admitting: Gastroenterology

## 2017-09-16 ENCOUNTER — Encounter: Payer: Self-pay | Admitting: Gastroenterology

## 2017-09-16 VITALS — BP 133/47 | HR 83 | Temp 98.9°F | Resp 15 | Ht 63.0 in | Wt 140.0 lb

## 2017-09-16 DIAGNOSIS — Z86010 Personal history of colonic polyps: Secondary | ICD-10-CM | POA: Diagnosis not present

## 2017-09-16 DIAGNOSIS — D123 Benign neoplasm of transverse colon: Secondary | ICD-10-CM | POA: Diagnosis not present

## 2017-09-16 DIAGNOSIS — R109 Unspecified abdominal pain: Secondary | ICD-10-CM | POA: Diagnosis not present

## 2017-09-16 MED ORDER — SODIUM CHLORIDE 0.9 % IV SOLN: 500.0000 mL | Freq: Once | INTRAVENOUS | Status: DC

## 2017-09-16 NOTE — Patient Instructions (Signed)
YOU HAD AN ENDOSCOPIC PROCEDURE TODAY AT [facility]:   Refer to the procedure report that was given to you for any specific questions about what was found during the examination.  If the procedure report does not answer your questions, please call your gastroenterologist to clarify.  If you requested that your care partner not be given the details of your procedure findings, then the procedure report has been included in a sealed envelope for you to review at your convenience later.  YOU SHOULD EXPECT: Some feelings of bloating in the abdomen. Passage of more gas than usual.  Walking can help get rid of the air that was put into your GI tract during the procedure and reduce the bloating. If you had a lower endoscopy (such as a colonoscopy or flexible sigmoidoscopy) you may notice spotting of blood in your stool or on the toilet paper. If you underwent a bowel prep for your procedure, you may not have a normal bowel movement for a few days.  Please Note:  You might notice some irritation and congestion in your nose or some drainage.  This is from the oxygen used during your procedure.  There is no need for concern and it should clear up in a day or so.  SYMPTOMS TO REPORT IMMEDIATELY:   Following lower endoscopy (colonoscopy or flexible sigmoidoscopy):  Excessive amounts of blood in the stool  Significant tenderness or worsening of abdominal pains  Swelling of the abdomen that is new, acute  Fever of 100F or higher  Please see handouts given to you on polyps and hemorrhoids.  For urgent or emergent issues, a gastroenterologist can be reached at any hour by calling [phone].   DIET:  We do recommend a small meal at first, but then you may proceed to your regular diet.  Drink plenty of fluids but you should avoid alcoholic beverages for 24 hours.  ACTIVITY:  You should plan to take it easy for the rest of today and you should NOT DRIVE or use heavy machinery until tomorrow  (because of the sedation medicines used during the test).    FOLLOW UP: Our staff will call the number listed on your records the next business day following your procedure to check on you and address any questions or concerns that you may have regarding the information given to you following your procedure. If we do not reach you, we will leave a message.  However, if you are feeling well and you are not experiencing any problems, there is no need to return our call.  We will assume that you have returned to your regular daily activities without incident.  If any biopsies were taken you will be contacted by phone or by letter within the next 1-3 weeks.  Please call us at [phone] if you have not heard about the biopsies in 3 weeks.    SIGNATURES/CONFIDENTIALITY: You and/or your care partner have signed paperwork which will be entered into your electronic medical record.  These signatures attest to the fact that that the information above on your After Visit Summary has been reviewed and is understood.  Full responsibility of the confidentiality of this discharge information lies with you and/or your care-partner.  Thank you for letting us take care of your healthcare needs today.

## 2017-09-16 NOTE — Op Note (Signed)
[facility] Patient Name: [name] Procedure Date: 09/16/2017 8:03 AM MRN: [id number] Endoscopist: Mauri Pole , MD Age: 72 Referring MD:  Date of Birth: 25-Sep-1945 Gender: Female Account #: [id number] Procedure:                Colonoscopy Indications:              High risk colon cancer surveillance: Personal                            history of colonic polyps, High risk colon cancer                            surveillance: Personal history of adenoma less than                            10 mm in size, Last colonoscopy: 2013 Medicines:                Monitored Anesthesia Care Procedure:                Pre-Anesthesia Assessment:                           - Prior to the procedure, a History and Physical                            was performed, and patient medications and                            allergies were reviewed. The patient's tolerance of                            previous anesthesia was also reviewed. The risks                            and benefits of the procedure and the sedation                            options and risks were discussed with the patient.                            All questions were answered, and informed consent                            was obtained. Prior Anticoagulants: The patient has                            taken no previous anticoagulant or antiplatelet                            agents. ASA Grade Assessment: II - A patient with                            mild systemic disease. After reviewing the risks  and benefits, the patient was deemed in                            satisfactory condition to undergo the procedure.                           After obtaining informed consent, the colonoscope                            was passed under direct vision. Throughout the                            procedure, the patient's blood pressure, pulse, and                            oxygen saturations  were monitored continuously. The                            Colonoscope was introduced through the anus and                            advanced to the the terminal ileum, with                            identification of the appendiceal orifice and IC                            valve. The colonoscopy was performed without                            difficulty. The patient tolerated the procedure                            well. The quality of the bowel preparation was                            excellent. The terminal ileum, ileocecal valve,                            appendiceal orifice, and rectum were photographed. Scope In: 8:06:09 AM Scope Out: 8:26:20 AM Scope Withdrawal Time: 0 hours 14 minutes 9 seconds  Total Procedure Duration: 0 hours 20 minutes 11 seconds  Findings:                 The perianal and digital rectal examinations were                            normal.                           A 12 mm polyp was found in the transverse colon.                            The polyp was granular lateral spreading. The polyp  was removed with a cold snare. Resection and                            retrieval were complete.                           A 1 mm polyp was found in the transverse colon. The                            polyp was sessile. The polyp was removed with a                            cold biopsy forceps. Resection and retrieval were                            complete.                           Non-bleeding internal hemorrhoids were found during                            retroflexion. The hemorrhoids were small. Complications:            No immediate complications. Estimated Blood Loss:     Estimated blood loss was minimal. Impression:               - One 12 mm polyp in the transverse colon, removed                            with a cold snare. Resected and retrieved.                           - One 1 mm polyp in the transverse colon, removed                             with a cold biopsy forceps. Resected and retrieved.                           - Non-bleeding internal hemorrhoids. Recommendation:           - Patient has a contact number available for                            emergencies. The signs and symptoms of potential                            delayed complications were discussed with the                            patient. Return to normal activities tomorrow.                            Written discharge instructions were provided to the  patient.                           - Resume previous diet.                           - Continue present medications.                           - Await pathology results.                           - Repeat colonoscopy in 3 years for surveillance                            based on pathology results to be scheduled with Dr                            Havery Moros (Primary GI) and follow up in GI office                            with Dr Havery Moros as needed Mauri Pole, MD 09/16/2017 8:32:10 AM This report has been signed electronically.

## 2017-09-16 NOTE — Progress Notes (Signed)
Called to room to assist during endoscopic procedure.  Patient ID and intended procedure confirmed with present staff. Received instructions for my participation in the procedure from the performing physician.  

## 2017-09-16 NOTE — Progress Notes (Signed)
A/ox3 pleased with MAC, report to RN 

## 2017-09-17 ENCOUNTER — Telehealth: Payer: Self-pay

## 2017-09-17 NOTE — Telephone Encounter (Signed)
  Follow up Call-  Call Tesla Keeler number 09/16/2017  Post procedure Call Naomii Kreger phone  # [phone]  Permission to leave phone message Yes  Some recent data might be hidden     Patient questions:  Do you have a fever, pain , or abdominal swelling? No. Pain Score  0 *  Have you tolerated food without any problems? Yes.    Have you been able to return to your normal activities? Yes.    Do you have any questions about your discharge instructions: Diet   No. Medications  No. Follow up visit  No.  Do you have questions or concerns about your Care? No.  Actions: * If pain score is 4 or above: No action needed, pain <4.

## 2017-09-22 ENCOUNTER — Encounter: Payer: Self-pay | Admitting: Gastroenterology

## 2017-09-22 ENCOUNTER — Other Ambulatory Visit: Payer: Self-pay | Admitting: Emergency Medicine

## 2017-11-02 ENCOUNTER — Other Ambulatory Visit: Payer: Self-pay

## 2017-11-02 ENCOUNTER — Inpatient Hospital Stay: Payer: Medicare Other | Attending: Hematology & Oncology | Admitting: Family

## 2017-11-02 ENCOUNTER — Encounter: Payer: Self-pay | Admitting: Family

## 2017-11-02 ENCOUNTER — Inpatient Hospital Stay: Payer: Medicare Other

## 2017-11-02 VITALS — BP 126/63 | HR 96 | Temp 97.6°F | Resp 18 | Wt 140.2 lb

## 2017-11-02 DIAGNOSIS — Z7982 Long term (current) use of aspirin: Secondary | ICD-10-CM | POA: Diagnosis not present

## 2017-11-02 DIAGNOSIS — D500 Iron deficiency anemia secondary to blood loss (chronic): Secondary | ICD-10-CM

## 2017-11-02 DIAGNOSIS — Z23 Encounter for immunization: Secondary | ICD-10-CM | POA: Insufficient documentation

## 2017-11-02 DIAGNOSIS — G713 Mitochondrial myopathy, not elsewhere classified: Secondary | ICD-10-CM | POA: Diagnosis not present

## 2017-11-02 DIAGNOSIS — D45 Polycythemia vera: Secondary | ICD-10-CM

## 2017-11-02 DIAGNOSIS — M810 Age-related osteoporosis without current pathological fracture: Secondary | ICD-10-CM | POA: Insufficient documentation

## 2017-11-02 LAB — CBC WITH DIFFERENTIAL (CANCER CENTER ONLY)
Abs Immature Granulocytes: 0.05 10*3/uL (ref 0.00–0.07)
Basophils Absolute: 0 10*3/uL (ref 0.0–0.1)
Basophils Relative: 1 %
Eosinophils Absolute: 0.2 10*3/uL (ref 0.0–0.5)
Eosinophils Relative: 3 %
HCT: 46.6 % — ABNORMAL HIGH (ref 36.0–46.0)
Hemoglobin: 15.7 g/dL — ABNORMAL HIGH (ref 12.0–15.0)
Immature Granulocytes: 1 %
Lymphocytes Relative: 12 %
Lymphs Abs: 0.8 10*3/uL (ref 0.7–4.0)
MCH: 32.8 pg (ref 26.0–34.0)
MCHC: 33.7 g/dL (ref 30.0–36.0)
MCV: 97.3 fL (ref 80.0–100.0)
Monocytes Absolute: 0.8 10*3/uL (ref 0.1–1.0)
Monocytes Relative: 11 %
Neutro Abs: 5 10*3/uL (ref 1.7–7.7)
Neutrophils Relative %: 72 %
Platelet Count: 158 10*3/uL (ref 150–400)
RBC: 4.79 MIL/uL (ref 3.87–5.11)
RDW: 13.5 % (ref 11.5–15.5)
WBC Count: 6.9 10*3/uL (ref 4.0–10.5)
nRBC: 0 % (ref 0.0–0.2)

## 2017-11-02 LAB — CMP (CANCER CENTER ONLY)
ALT: 38 U/L (ref 10–47)
AST: 52 U/L — ABNORMAL HIGH (ref 11–38)
Albumin: 3.4 g/dL — ABNORMAL LOW (ref 3.5–5.0)
Alkaline Phosphatase: 77 U/L (ref 26–84)
Anion gap: 5 (ref 5–15)
BUN: 11 mg/dL (ref 7–22)
CO2: 25 mmol/L (ref 18–33)
Calcium: 9.3 mg/dL (ref 8.0–10.3)
Chloride: 107 mmol/L (ref 98–108)
Creatinine: 0.7 mg/dL (ref 0.60–1.20)
Glucose, Bld: 102 mg/dL (ref 73–118)
Potassium: 3.9 mmol/L (ref 3.3–4.7)
Sodium: 137 mmol/L (ref 128–145)
Total Bilirubin: 1.2 mg/dL (ref 0.2–1.6)
Total Protein: 7.2 g/dL (ref 6.4–8.1)

## 2017-11-02 LAB — LACTATE DEHYDROGENASE: LDH: 206 U/L — ABNORMAL HIGH (ref 98–192)

## 2017-11-03 LAB — IRON AND TIBC
Iron: 144 ug/dL — ABNORMAL HIGH (ref 41–142)
Saturation Ratios: 42 % (ref 21–57)
TIBC: 343 ug/dL (ref 236–444)
UIBC: 198 ug/dL

## 2017-11-03 LAB — FERRITIN: Ferritin: 64 ng/mL (ref 11–307)

## 2017-11-03 NOTE — Progress Notes (Signed)
Hematology and Oncology Follow Up Visit  [name] [id number] May 28, 1945 72 y.o. 11/03/2017   Principle Diagnosis:  1. 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 Osteoporosis - with fracture at T11  Current Therapy:   1. Phlebotomy to maintain hematocrit below 45% 2. Aspirin 81 mg p.o. Daily. 3. IV iron as indicated - last received Dec/Jan x 2 4. Zometa IV every 6 months - next [id number]   Interim History:  [name] is here today with her husband for follow-up. She is having lower back discomfort that waxes and wanes. She has occasional palpitations. This is unchanged.  Hct is 46.6% so we will get her set up for a phlebotomy next week once they return from the beach.  She had her colonoscopy in August with 2 benign polyps removed and non-bleeding hemorrhoids noted.  No episodes of bleeding to report. Her skin is thin and she does bruise easily.  No fever, chills, n/v, cough, rash, dizziness, SOB, chest pain, abdominal pain or changes in bowel or bladder habits.  No swelling, tenderness, numbness or tingling in her extremities.  No lymphadenopathy noted on exam.  She has maintained a good appetite and is staying well hydrated. Her weight is stable.   ECOG Performance Status: 1 - Symptomatic but completely ambulatory  Medications:  Allergies as of 11/02/2017      Reactions   Dulera [mometasone Furo-formoterol Fum] Cough      Medication List        Accurate as of 11/02/17 11:59 PM. Always use your most recent med list.          albuterol 108 (90 Base) MCG/ACT inhaler Commonly known as:  PROVENTIL HFA;VENTOLIN HFA Inhale 2 puffs into the lungs every 4 (four) hours as needed for wheezing or shortness of breath.   LORazepam 0.5 MG tablet Commonly known as:  ATIVAN TAKE 1 TABLET BY MOUTH EVERY NIGHT AT  BEDTIME   pantoprazole 40 MG tablet Commonly known as:  PROTONIX take 1 tablet by mouth daily before meals   sertraline 100 MG tablet Commonly known as:  ZOLOFT Take 100 mg by mouth daily.   SYMBICORT 80-4.5 MCG/ACT inhaler Generic drug:  budesonide-formoterol INHALE 2 PUFFS BY MOUTH TWICE A DAY   Vitamin D (Ergocalciferol) 50000 units Caps capsule Commonly known as:  DRISDOL Take 1 capsule (50,000 Units total) by mouth every 7 (seven) days. Sundays       Allergies:  Allergies  Allergen Reactions  . Dulera [Mometasone Furo-Formoterol Fum] Cough    Past Medical History, Surgical history, Social history, and Family History were reviewed and updated.  Review of Systems: All other 10 point review of systems is negative.   Physical Exam:  weight is 140 lb 4 oz (63.6 kg). Her oral temperature is 97.6 F (36.4 C). Her blood pressure is 126/63 and her pulse is 96. Her respiration is 18 and oxygen saturation is 95%.   Wt Readings from Last 3 Encounters:  11/02/17 140 lb 4 oz (63.6 kg)  09/16/17 140 lb (63.5 kg)  09/09/17 141 lb (64 kg)    Ocular: Sclerae unicteric, pupils equal, round and reactive to light Ear-nose-throat: Oropharynx clear, dentition fair Lymphatic: No cervical, supraclavicular or axillary adenopathy Lungs no rales or rhonchi, good excursion bilaterally Heart regular rate and rhythm, no murmur appreciated Abd soft, nontender, positive bowel sounds, no liver or spleen tip palpated on exam, no  fluid wave  MSK no focal spinal tenderness, no joint edema Neuro: non-focal, well-oriented, appropriate affect Breasts: Deferred   Lab Results  Component Value Date   WBC 6.9 11/02/2017   HGB 15.7 (H) 11/02/2017   HCT 46.6 (H) 11/02/2017   MCV 97.3 11/02/2017   PLT 158 11/02/2017   Lab Results  Component Value Date   FERRITIN 64 11/02/2017   IRON 144 (H) 11/02/2017   TIBC 343 11/02/2017   UIBC 198 11/02/2017   IRONPCTSAT 42 11/02/2017   Lab Results   Component Value Date   RETICCTPCT 1.5 06/12/2014   RBC 4.79 11/02/2017   RETICCTABS 65.9 06/12/2014   Lab Results  Component Value Date   KAPLAMBRATIO 0.91 04/27/2016   Lab Results  Component Value Date   IGGSERUM 897 04/27/2016   IGA 339 02/27/2014   IGMSERUM 132 04/27/2016   Lab Results  Component Value Date   MSPIKE Not Observed 04/27/2016     Chemistry      Component Value Date/Time   NA 137 11/02/2017 1305   NA 144 01/20/2017 1344   NA 138 01/17/2016 1356   K 3.9 11/02/2017 1305   K 3.6 01/20/2017 1344   K 4.1 01/17/2016 1356   CL 107 11/02/2017 1305   CL 107 01/20/2017 1344   CO2 25 11/02/2017 1305   CO2 25 01/20/2017 1344   CO2 25 01/17/2016 1356   BUN 11 11/02/2017 1305   BUN 12 01/20/2017 1344   BUN 12.3 01/17/2016 1356   CREATININE 0.70 11/02/2017 1305   CREATININE 0.7 01/20/2017 1344   CREATININE 0.8 01/17/2016 1356      Component Value Date/Time   CALCIUM 9.3 11/02/2017 1305   CALCIUM 8.9 01/20/2017 1344   CALCIUM 9.1 01/17/2016 1356   ALKPHOS 77 11/02/2017 1305   ALKPHOS 68 01/20/2017 1344   ALKPHOS 87 01/17/2016 1356   AST 52 (H) 11/02/2017 1305   AST 48 (H) 01/17/2016 1356   ALT 38 11/02/2017 1305   ALT 49 (H) 01/20/2017 1344   ALT 38 01/17/2016 1356   BILITOT 1.2 11/02/2017 1305   BILITOT 0.67 01/17/2016 1356      Impression and Plan: [name] is a very pleasant 72 yo caucasian female with polycythemia, hemochromatosis (H63D heterozygote mutation) with mitochondrial myopathy.  Hct is 46.6%, ferritin 64 and iron saturation 42%.  We wills ether up for phlebotomy next week once they return from the beach. She will also get replacement fluids.  We will then see her back in 6 weeks.  She will contact our office with any questions or concerns. We can certainly see her sooner if need be.   Laverna Peace, NP 10/9/201910:24 AM

## 2017-11-09 ENCOUNTER — Other Ambulatory Visit: Payer: Self-pay

## 2017-11-09 ENCOUNTER — Inpatient Hospital Stay: Payer: Medicare Other

## 2017-11-09 VITALS — BP 104/52 | HR 83 | Temp 98.0°F | Resp 18

## 2017-11-09 DIAGNOSIS — Z23 Encounter for immunization: Secondary | ICD-10-CM | POA: Diagnosis not present

## 2017-11-09 DIAGNOSIS — D45 Polycythemia vera: Secondary | ICD-10-CM

## 2017-11-09 DIAGNOSIS — Z7982 Long term (current) use of aspirin: Secondary | ICD-10-CM | POA: Diagnosis not present

## 2017-11-09 DIAGNOSIS — G713 Mitochondrial myopathy, not elsewhere classified: Secondary | ICD-10-CM | POA: Diagnosis not present

## 2017-11-09 DIAGNOSIS — D508 Other iron deficiency anemias: Secondary | ICD-10-CM

## 2017-11-09 DIAGNOSIS — M810 Age-related osteoporosis without current pathological fracture: Secondary | ICD-10-CM | POA: Diagnosis not present

## 2017-11-09 MED ORDER — INFLUENZA VAC SPLIT QUAD 0.5 ML IM SUSY
0.5000 mL | PREFILLED_SYRINGE | Freq: Once | INTRAMUSCULAR | Status: AC
  Administered 2017-11-09: 0.5 mL via INTRAMUSCULAR

## 2017-11-09 MED ORDER — SODIUM CHLORIDE 0.9 % IV SOLN
INTRAVENOUS | Status: DC
  Administered 2017-11-09: 14:00:00 via INTRAVENOUS
  Filled 2017-11-09 (×2): qty 250

## 2017-11-09 MED ORDER — INFLUENZA VAC SPLIT QUAD 0.5 ML IM SUSY
PREFILLED_SYRINGE | INTRAMUSCULAR | Status: AC
  Filled 2017-11-09: qty 0.5

## 2017-11-09 NOTE — Patient Instructions (Signed)
Therapeutic Phlebotomy Therapeutic phlebotomy is the controlled removal of blood from a person's body for the purpose of treating a medical condition. The procedure is similar to donating blood. Usually, about a pint (470 mL, or 0.47L) of blood is removed. The average adult has 9-12 pints (4.3-5.7 L) of blood. Therapeutic phlebotomy may be used to treat the following medical conditions:  Hemochromatosis. This is a condition in which the blood contains too much iron.  Polycythemia vera. This is a condition in which the blood contains too many red blood cells.  Porphyria cutanea tarda. This is a disease in which an important part of hemoglobin is not made properly. It results in the buildup of abnormal amounts of porphyrins in the body.  Sickle cell disease. This is a condition in which the red blood cells form an abnormal crescent shape rather than a round shape.  Tell a health care provider about:  Any allergies you have.  All medicines you are taking, including vitamins, herbs, eye drops, creams, and over-the-counter medicines.  Any problems you or family members have had with anesthetic medicines.  Any blood disorders you have.  Any surgeries you have had.  Any medical conditions you have. What are the risks? Generally, this is a safe procedure. However, problems may occur, including:  Nausea or light-headedness.  Low blood pressure.  Soreness, bleeding, swelling, or bruising at the needle insertion site.  Infection.  What happens before the procedure?  Follow instructions from your health care provider about eating or drinking restrictions.  Ask your health care provider about changing or stopping your regular medicines. This is especially important if you are taking diabetes medicines or blood thinners.  Wear clothing with sleeves that can be raised above the elbow.  Plan to have someone take you home after the procedure.  You may have a blood sample taken. What  happens during the procedure?  A needle will be inserted into one of your veins.  Tubing and a collection bag will be attached to that needle.  Blood will flow through the needle and tubing into the collection bag.  You may be asked to open and close your hand slowly and continually during the entire collection.  After the specified amount of blood has been removed from your body, the collection bag and tubing will be clamped.  The needle will be removed from your vein.  Pressure will be held on the site of the needle insertion to stop the bleeding.  A bandage (dressing) will be placed over the needle insertion site. The procedure may vary among health care providers and hospitals. What happens after the procedure?  Your recovery will be assessed and monitored.  You can return to your normal activities as directed by your health care provider. This information is not intended to replace advice given to you by your health care provider. Make sure you discuss any questions you have with your health care provider. Document Released: 06/16/2010 Document Revised: 09/14/2015 Document Reviewed: 01/08/2014 Elsevier Interactive Patient Education  2018 [employer]  

## 2017-11-09 NOTE — Progress Notes (Signed)
[name] presents today for phlebotomy per MD orders. Phlebotomy procedure started at  1325 and ended at 1355 nad was performed by Inez Pilgrim, RN. 498 cc removed via 20G needle at R antecubital and replacement fluids were given through the same IV. Patient tolerated procedure well

## 2017-11-18 DIAGNOSIS — D225 Melanocytic nevi of trunk: Secondary | ICD-10-CM | POA: Diagnosis not present

## 2017-11-18 DIAGNOSIS — L821 Other seborrheic keratosis: Secondary | ICD-10-CM | POA: Diagnosis not present

## 2017-12-02 DIAGNOSIS — N898 Other specified noninflammatory disorders of vagina: Secondary | ICD-10-CM | POA: Diagnosis not present

## 2017-12-02 DIAGNOSIS — N959 Unspecified menopausal and perimenopausal disorder: Secondary | ICD-10-CM | POA: Diagnosis not present

## 2017-12-02 DIAGNOSIS — Z124 Encounter for screening for malignant neoplasm of cervix: Secondary | ICD-10-CM | POA: Diagnosis not present

## 2017-12-02 DIAGNOSIS — Z6826 Body mass index (BMI) 26.0-26.9, adult: Secondary | ICD-10-CM | POA: Diagnosis not present

## 2017-12-02 DIAGNOSIS — Z01419 Encounter for gynecological examination (general) (routine) without abnormal findings: Secondary | ICD-10-CM | POA: Diagnosis not present

## 2017-12-03 DIAGNOSIS — Z124 Encounter for screening for malignant neoplasm of cervix: Secondary | ICD-10-CM | POA: Diagnosis not present

## 2017-12-06 DIAGNOSIS — E559 Vitamin D deficiency, unspecified: Secondary | ICD-10-CM | POA: Diagnosis not present

## 2017-12-06 DIAGNOSIS — R82998 Other abnormal findings in urine: Secondary | ICD-10-CM | POA: Diagnosis not present

## 2017-12-06 DIAGNOSIS — E538 Deficiency of other specified B group vitamins: Secondary | ICD-10-CM | POA: Diagnosis not present

## 2017-12-06 DIAGNOSIS — Z79899 Other long term (current) drug therapy: Secondary | ICD-10-CM | POA: Diagnosis not present

## 2017-12-17 ENCOUNTER — Telehealth: Payer: Self-pay | Admitting: Family

## 2017-12-17 NOTE — Telephone Encounter (Signed)
Received call from pt to r/s 11/25 appts to 11/26 at 130 pm

## 2017-12-20 ENCOUNTER — Ambulatory Visit: Payer: Medicare Other | Admitting: Family

## 2017-12-20 ENCOUNTER — Other Ambulatory Visit: Payer: Medicare Other

## 2017-12-20 ENCOUNTER — Ambulatory Visit: Payer: Medicare Other

## 2017-12-21 ENCOUNTER — Inpatient Hospital Stay: Payer: Medicare Other

## 2017-12-21 ENCOUNTER — Inpatient Hospital Stay (HOSPITAL_BASED_OUTPATIENT_CLINIC_OR_DEPARTMENT_OTHER): Payer: Medicare Other | Admitting: Family

## 2017-12-21 ENCOUNTER — Inpatient Hospital Stay: Payer: Medicare Other | Attending: Hematology & Oncology

## 2017-12-21 DIAGNOSIS — M810 Age-related osteoporosis without current pathological fracture: Secondary | ICD-10-CM | POA: Diagnosis not present

## 2017-12-21 DIAGNOSIS — R5383 Other fatigue: Secondary | ICD-10-CM

## 2017-12-21 DIAGNOSIS — Z7982 Long term (current) use of aspirin: Secondary | ICD-10-CM | POA: Insufficient documentation

## 2017-12-21 DIAGNOSIS — D45 Polycythemia vera: Secondary | ICD-10-CM | POA: Diagnosis not present

## 2017-12-21 DIAGNOSIS — R002 Palpitations: Secondary | ICD-10-CM | POA: Insufficient documentation

## 2017-12-21 DIAGNOSIS — G713 Mitochondrial myopathy, not elsewhere classified: Secondary | ICD-10-CM

## 2017-12-21 DIAGNOSIS — R42 Dizziness and giddiness: Secondary | ICD-10-CM | POA: Diagnosis not present

## 2017-12-21 DIAGNOSIS — R6883 Chills (without fever): Secondary | ICD-10-CM

## 2017-12-21 LAB — CMP (CANCER CENTER ONLY)
ALT: 28 U/L (ref 0–44)
AST: 43 U/L — ABNORMAL HIGH (ref 15–41)
Albumin: 3.9 g/dL (ref 3.5–5.0)
Alkaline Phosphatase: 70 U/L (ref 38–126)
Anion gap: 10 (ref 5–15)
BUN: 12 mg/dL (ref 8–23)
CO2: 25 mmol/L (ref 22–32)
Calcium: 8.8 mg/dL — ABNORMAL LOW (ref 8.9–10.3)
Chloride: 98 mmol/L (ref 98–111)
Creatinine: 0.67 mg/dL (ref 0.44–1.00)
GFR, Est AFR Am: >60 mL/min (ref >60)
GFR, Estimated: >60 mL/min (ref >60)
Glucose, Bld: 105 mg/dL — ABNORMAL HIGH (ref 70–99)
Potassium: 3.7 mmol/L (ref 3.5–5.1)
Sodium: 133 mmol/L — ABNORMAL LOW (ref 135–145)
Total Bilirubin: 0.9 mg/dL (ref 0.3–1.2)
Total Protein: 6.8 g/dL (ref 6.5–8.1)

## 2017-12-21 LAB — CBC WITH DIFFERENTIAL (CANCER CENTER ONLY)
Abs Immature Granulocytes: 0.04 10*3/uL (ref 0.00–0.07)
Basophils Absolute: 0 10*3/uL (ref 0.0–0.1)
Basophils Relative: 1 %
Eosinophils Absolute: 0.1 10*3/uL (ref 0.0–0.5)
Eosinophils Relative: 1 %
HCT: 44.5 % (ref 36.0–46.0)
Hemoglobin: 14.5 g/dL (ref 12.0–15.0)
Immature Granulocytes: 1 %
Lymphocytes Relative: 12 %
Lymphs Abs: 0.7 10*3/uL (ref 0.7–4.0)
MCH: 33.1 pg (ref 26.0–34.0)
MCHC: 32.6 g/dL (ref 30.0–36.0)
MCV: 101.6 fL — ABNORMAL HIGH (ref 80.0–100.0)
Monocytes Absolute: 0.7 10*3/uL (ref 0.1–1.0)
Monocytes Relative: 11 %
Neutro Abs: 4.5 10*3/uL (ref 1.7–7.7)
Neutrophils Relative %: 74 %
Platelet Count: 160 10*3/uL (ref 150–400)
RBC: 4.38 MIL/uL (ref 3.87–5.11)
RDW: 13.2 % (ref 11.5–15.5)
WBC Count: 6.1 10*3/uL (ref 4.0–10.5)
nRBC: 0 % (ref 0.0–0.2)

## 2017-12-21 NOTE — Progress Notes (Signed)
Hematology and Oncology Follow Up Visit  [name] [id number] 01/31/1945 72 y.o. 12/21/2017   Principle Diagnosis:  1. 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 Osteoporosis - with fracture at T11  Current Therapy:  1. Phlebotomy to maintain hematocrit below 45% 2. Aspirin 81 mg p.o. Daily. 3. IV iron as indicated - last received Dec/Jan x 2 4. Zometa IV every 6 months - Due in December   Interim History:  [name] is here today with her husband for follow-up. She is symptomatic with fatigue, chills, palpitations and lightheadedness.  She has a small amount of bright red blood on her toilet tissue when she strains with BM due to hemorrhoids. She has not noted any other bleeding. No bruising or petechiae.  No fever, n/v, cough, rash, chest pain, palpitations, abdominal pain or changes in bowel or bladder habits.  She has IBS-D. She had her colonoscopy  She has had SOB with over exertion due to asthma.  She has gallstones and will have intermittent twinges of pain in her right upper quadrant.  No swelling, tenderness, numbness or tingling in her extremities.  No lymphadenopathy noted on exam.  She has a good appetite and is staying well hydrated. Her weight is stable.   ECOG Performance Status: 1 - Symptomatic but completely ambulatory  Medications:  Allergies as of 12/21/2017      Reactions   Dulera [mometasone Furo-formoterol Fum] Cough      Medication List        Accurate as of 12/21/17  2:45 PM. Always use your most recent med list.          albuterol 108 (90 Base) MCG/ACT inhaler Commonly known as:  PROVENTIL HFA;VENTOLIN HFA Inhale 2 puffs into the lungs every 4 (four) hours as needed for wheezing or shortness of breath.   LORazepam 0.5 MG tablet Commonly known as:  ATIVAN TAKE 1 TABLET BY MOUTH EVERY NIGHT AT  BEDTIME   pantoprazole 40 MG tablet Commonly known as:  PROTONIX take 1 tablet by mouth daily before meals   sertraline 100 MG tablet Commonly known as:  ZOLOFT Take 100 mg by mouth daily.   SYMBICORT 80-4.5 MCG/ACT inhaler Generic drug:  budesonide-formoterol INHALE 2 PUFFS BY MOUTH TWICE A DAY   Vitamin D (Ergocalciferol) 1.25 MG (50000 UT) Caps capsule Commonly known as:  DRISDOL Take 1 capsule (50,000 Units total) by mouth every 7 (seven) days. Sundays       Allergies:  Allergies  Allergen Reactions  . Dulera [Mometasone Furo-Formoterol Fum] Cough    Past Medical History, Surgical history, Social history, and Family History were reviewed and updated.  Review of Systems: All other 10 point review of systems is negative.   Physical Exam:  vitals were not taken for this visit.   Wt Readings from Last 3 Encounters:  11/02/17 140 lb 4 oz (63.6 kg)  09/16/17 140 lb (63.5 kg)  09/09/17 141 lb (64 kg)    Ocular: Sclerae unicteric, pupils equal, round and reactive to light Ear-nose-throat: Oropharynx clear, dentition fair Lymphatic: No cervical, supraclavicular or axillary adenopathy Lungs no rales or rhonchi, good excursion bilaterally Heart regular rate and rhythm, no murmur appreciated Abd soft, nontender, positive bowel sounds, no liver or spleen tip palpated on exam, no fluid wave  MSK no focal spinal tenderness, no joint edema Neuro: non-focal, well-oriented, appropriate affect Breasts: Deferred   Lab Results  Component Value Date   WBC 6.1 12/21/2017   HGB 14.5 12/21/2017   HCT 44.5 12/21/2017   MCV 101.6 (H) 12/21/2017   PLT 160 12/21/2017   Lab Results  Component Value Date   FERRITIN 64 11/02/2017   IRON 144 (H) 11/02/2017   TIBC 343 11/02/2017   UIBC 198 11/02/2017   IRONPCTSAT 42 11/02/2017   Lab Results  Component Value Date   RETICCTPCT 1.5 06/12/2014   RBC 4.38 12/21/2017   RETICCTABS 65.9 06/12/2014   Lab Results  Component Value  Date   KAPLAMBRATIO 0.91 04/27/2016   Lab Results  Component Value Date   IGGSERUM 897 04/27/2016   IGA 339 02/27/2014   IGMSERUM 132 04/27/2016   Lab Results  Component Value Date   MSPIKE Not Observed 04/27/2016     Chemistry      Component Value Date/Time   NA 133 (L) 12/21/2017 1345   NA 144 01/20/2017 1344   NA 138 01/17/2016 1356   K 3.7 12/21/2017 1345   K 3.6 01/20/2017 1344   K 4.1 01/17/2016 1356   CL 98 12/21/2017 1345   CL 107 01/20/2017 1344   CO2 25 12/21/2017 1345   CO2 25 01/20/2017 1344   CO2 25 01/17/2016 1356   BUN 12 12/21/2017 1345   BUN 12 01/20/2017 1344   BUN 12.3 01/17/2016 1356   CREATININE 0.67 12/21/2017 1345   CREATININE 0.7 01/20/2017 1344   CREATININE 0.8 01/17/2016 1356      Component Value Date/Time   CALCIUM 8.8 (L) 12/21/2017 1345   CALCIUM 8.9 01/20/2017 1344   CALCIUM 9.1 01/17/2016 1356   ALKPHOS 70 12/21/2017 1345   ALKPHOS 68 01/20/2017 1344   ALKPHOS 87 01/17/2016 1356   AST 43 (H) 12/21/2017 1345   AST 48 (H) 01/17/2016 1356   ALT 28 12/21/2017 1345   ALT 49 (H) 01/20/2017 1344   ALT 38 01/17/2016 1356   BILITOT 0.9 12/21/2017 1345   BILITOT 0.67 01/17/2016 1356       Impression and Plan: [name] is a very pleasant 72 yo caucasian female with polycythemia, hemochromatosis (H63D heterozygote mutation) with mitochondrial myopathy.  Hct today is 44.5%. We will see what her iron studies show and bring her back in for phlebotomy if needed.  We will plan to see her in another 6 weeks and she will get her Zometa at that time.   They will contact our office with any questions or concerns. We can certainly see her sooner if need be.   Laverna Peace, NP 11/26/20192:45 PM

## 2017-12-22 ENCOUNTER — Telehealth: Payer: Self-pay | Admitting: *Deleted

## 2017-12-22 LAB — IRON AND TIBC
Iron: 92 ug/dL (ref 41–142)
Saturation Ratios: 27 % (ref 21–57)
TIBC: 341 ug/dL (ref 236–444)
UIBC: 250 ug/dL (ref 120–384)

## 2017-12-22 LAB — FERRITIN: Ferritin: 36 ng/mL (ref 11–307)

## 2017-12-22 LAB — LACTATE DEHYDROGENASE: LDH: 225 U/L — ABNORMAL HIGH (ref 98–192)

## 2017-12-22 NOTE — Telephone Encounter (Signed)
As noted below by Doyce Loose, NP, I informed patient that her iron studies look great. She doesn't need a phlebotomy or infusion at this time. She verbalized understanding.

## 2017-12-22 NOTE — Telephone Encounter (Signed)
-----   Message from Eliezer Bottom, NP sent at 12/22/2017 10:41 AM EST ----- Iron studies look great! No phlebotomy or infusion needed at this time. Thank you!  [name]  ----- Message ----- From: Buel Ream, [facility] [place] Sent: 12/21/2017   2:01 PM EST To: Eliezer Bottom, NP

## 2017-12-30 DIAGNOSIS — J45998 Other asthma: Secondary | ICD-10-CM | POA: Diagnosis not present

## 2017-12-30 DIAGNOSIS — Z6824 Body mass index (BMI) 24.0-24.9, adult: Secondary | ICD-10-CM | POA: Diagnosis not present

## 2017-12-30 DIAGNOSIS — R251 Tremor, unspecified: Secondary | ICD-10-CM | POA: Diagnosis not present

## 2017-12-30 DIAGNOSIS — F418 Other specified anxiety disorders: Secondary | ICD-10-CM | POA: Diagnosis not present

## 2017-12-30 DIAGNOSIS — J449 Chronic obstructive pulmonary disease, unspecified: Secondary | ICD-10-CM | POA: Diagnosis not present

## 2017-12-30 DIAGNOSIS — Z1389 Encounter for screening for other disorder: Secondary | ICD-10-CM | POA: Diagnosis not present

## 2017-12-30 DIAGNOSIS — Z0000 Encounter for general adult medical examination without abnormal findings: Secondary | ICD-10-CM | POA: Diagnosis not present

## 2017-12-30 DIAGNOSIS — F330 Major depressive disorder, recurrent, mild: Secondary | ICD-10-CM | POA: Diagnosis not present

## 2017-12-30 DIAGNOSIS — G713 Mitochondrial myopathy, not elsewhere classified: Secondary | ICD-10-CM | POA: Diagnosis not present

## 2018-02-01 ENCOUNTER — Other Ambulatory Visit: Payer: Self-pay

## 2018-02-01 ENCOUNTER — Encounter: Payer: Self-pay | Admitting: Family

## 2018-02-01 ENCOUNTER — Inpatient Hospital Stay: Payer: Medicare Other

## 2018-02-01 ENCOUNTER — Inpatient Hospital Stay: Payer: Medicare Other | Attending: Hematology & Oncology | Admitting: Family

## 2018-02-01 DIAGNOSIS — D45 Polycythemia vera: Secondary | ICD-10-CM | POA: Diagnosis not present

## 2018-02-01 DIAGNOSIS — D751 Secondary polycythemia: Secondary | ICD-10-CM

## 2018-02-01 DIAGNOSIS — D508 Other iron deficiency anemias: Secondary | ICD-10-CM

## 2018-02-01 DIAGNOSIS — G713 Mitochondrial myopathy, not elsewhere classified: Secondary | ICD-10-CM

## 2018-02-01 DIAGNOSIS — M810 Age-related osteoporosis without current pathological fracture: Secondary | ICD-10-CM

## 2018-02-01 DIAGNOSIS — M8008XG Age-related osteoporosis with current pathological fracture, vertebra(e), subsequent encounter for fracture with delayed healing: Secondary | ICD-10-CM

## 2018-02-01 DIAGNOSIS — D500 Iron deficiency anemia secondary to blood loss (chronic): Secondary | ICD-10-CM

## 2018-02-01 LAB — CMP (CANCER CENTER ONLY)
ALT: 27 U/L (ref 0–44)
AST: 38 U/L (ref 15–41)
Albumin: 3.6 g/dL (ref 3.5–5.0)
Alkaline Phosphatase: 75 U/L (ref 38–126)
Anion gap: 8 (ref 5–15)
BUN: 14 mg/dL (ref 8–23)
CO2: 27 mmol/L (ref 22–32)
Calcium: 8.9 mg/dL (ref 8.9–10.3)
Chloride: 102 mmol/L (ref 98–111)
Creatinine: 0.66 mg/dL (ref 0.44–1.00)
GFR, Est AFR Am: >60 mL/min (ref >60)
GFR, Estimated: >60 mL/min (ref >60)
Glucose, Bld: 79 mg/dL (ref 70–99)
Potassium: 3.9 mmol/L (ref 3.5–5.1)
Sodium: 137 mmol/L (ref 135–145)
Total Bilirubin: 0.7 mg/dL (ref 0.3–1.2)
Total Protein: 7 g/dL (ref 6.5–8.1)

## 2018-02-01 LAB — CBC WITH DIFFERENTIAL (CANCER CENTER ONLY)
Abs Immature Granulocytes: 0.07 10*3/uL (ref 0.00–0.07)
Basophils Absolute: 0 10*3/uL (ref 0.0–0.1)
Basophils Relative: 1 %
Eosinophils Absolute: 0.1 10*3/uL (ref 0.0–0.5)
Eosinophils Relative: 2 %
HCT: 45.4 % (ref 36.0–46.0)
Hemoglobin: 15.2 g/dL — ABNORMAL HIGH (ref 12.0–15.0)
Immature Granulocytes: 1 %
Lymphocytes Relative: 11 %
Lymphs Abs: 1 10*3/uL (ref 0.7–4.0)
MCH: 34.2 pg — ABNORMAL HIGH (ref 26.0–34.0)
MCHC: 33.5 g/dL (ref 30.0–36.0)
MCV: 102 fL — ABNORMAL HIGH (ref 80.0–100.0)
Monocytes Absolute: 0.8 10*3/uL (ref 0.1–1.0)
Monocytes Relative: 10 %
Neutro Abs: 6.5 10*3/uL (ref 1.7–7.7)
Neutrophils Relative %: 75 %
Platelet Count: 166 10*3/uL (ref 150–400)
RBC: 4.45 MIL/uL (ref 3.87–5.11)
RDW: 12.3 % (ref 11.5–15.5)
WBC Count: 8.5 10*3/uL (ref 4.0–10.5)
nRBC: 0 % (ref 0.0–0.2)

## 2018-02-01 LAB — LACTATE DEHYDROGENASE: LDH: 183 U/L (ref 98–192)

## 2018-02-01 MED ORDER — SODIUM CHLORIDE 0.9 % IV SOLN
Freq: Once | INTRAVENOUS | Status: AC
  Administered 2018-02-01: 14:00:00 via INTRAVENOUS
  Filled 2018-02-01: qty 250

## 2018-02-01 MED ORDER — ZOLEDRONIC ACID 4 MG/100ML IV SOLN
4.0000 mg | Freq: Once | INTRAVENOUS | Status: AC
  Administered 2018-02-01: 4 mg via INTRAVENOUS
  Filled 2018-02-01: qty 100

## 2018-02-01 NOTE — Progress Notes (Signed)
Hematology and Oncology Follow Up Visit  [name] [id number] 1945-12-19 73 y.o. 02/01/2018   Principle Diagnosis:  1. 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 Osteoporosis - with fracture at T11  Current Therapy:   1. Phlebotomy to maintain hematocrit below 45% 2. Aspirin 81 mg p.o. Daily. 3. IV iron as indicated 4. Zometa IV every 6 months    Interim History:  [name] is here today with her husband for follow-up. She has had a rough few weeks. One of her best friends passed away unexpectedly.  She has also started a beta block for her tremor (which has resolved) but the medication causes her to have some dizziness, SOB and fatigue. She plans to let her PCP know that she is still having symptoms.  No fever, chills, n/v, cough, rash, chest pain, palpitations, abdominal pain or changes in bowel or bladder habits.  No swelling, tenderness, numbness or tingling in her extremities.  No lymphadenopathy noted on exam.  She is eating well and staying hydrated. Her weight is stable.   ECOG Performance Status: 1 - Symptomatic but completely ambulatory  Medications:  Allergies as of 02/01/2018      Reactions   Dulera [mometasone Furo-formoterol Fum] Cough      Medication List       Accurate as of February 01, 2018  1:36 PM. Always use your most recent med list.        albuterol 108 (90 Base) MCG/ACT inhaler Commonly known as:  PROVENTIL HFA;VENTOLIN HFA Inhale 2 puffs into the lungs every 4 (four) hours as needed for wheezing or shortness of breath.   LORazepam 0.5 MG tablet Commonly known as:  ATIVAN TAKE 1 TABLET BY MOUTH EVERY NIGHT AT BEDTIME   pantoprazole 40 MG tablet Commonly known as:  PROTONIX take 1 tablet by mouth daily before meals   propranolol 40 MG tablet Commonly known as:  INDERAL Take 40 mg by mouth daily.  Take 1/4 of tablet total of 10 mg once a day.   sertraline 100 MG tablet Commonly known as:  ZOLOFT Take 100 mg by mouth daily.   SYMBICORT 80-4.5 MCG/ACT inhaler Generic drug:  budesonide-formoterol INHALE 2 PUFFS BY MOUTH TWICE A DAY   Vitamin D (Ergocalciferol) 1.25 MG (50000 UT) Caps capsule Commonly known as:  DRISDOL Take 1 capsule (50,000 Units total) by mouth every 7 (seven) days. Sundays       Allergies:  Allergies  Allergen Reactions  . Dulera [Mometasone Furo-Formoterol Fum] Cough    Past Medical History, Surgical history, Social history, and Family History were reviewed and updated.  Review of Systems: All other 10 point review of systems is negative.   Physical Exam:  weight is 140 lb (63.5 kg). Her oral temperature is 98 F (36.7 C). Her blood pressure is 117/58 (abnormal) and her pulse is 71. Her respiration is 18 and oxygen saturation is 95%.   Wt Readings from Last 3 Encounters:  02/01/18 140 lb (63.5 kg)  11/02/17 140 lb 4 oz (63.6 kg)  09/16/17 140 lb (63.5 kg)    Ocular: Sclerae unicteric, pupils equal, round and reactive to light Ear-nose-throat: Oropharynx clear, dentition fair Lymphatic: No cervical, supraclavicular or axillary adenopathy Lungs no rales or rhonchi, good excursion bilaterally Heart regular rate and rhythm, no murmur appreciated Abd soft, nontender, positive bowel sounds, no liver or spleen tip palpated on exam, no fluid  wave MSK no focal spinal tenderness, no joint edema Neuro: non-focal, well-oriented, appropriate affect Breasts: Deferred   Lab Results  Component Value Date   WBC 8.5 02/01/2018   HGB 15.2 (H) 02/01/2018   HCT 45.4 02/01/2018   MCV 102.0 (H) 02/01/2018   PLT 166 02/01/2018   Lab Results  Component Value Date   FERRITIN 36 12/21/2017   IRON 92 12/21/2017   TIBC 341 12/21/2017   UIBC 250 12/21/2017   IRONPCTSAT 27 12/21/2017   Lab Results  Component Value Date   RETICCTPCT 1.5 06/12/2014   RBC  4.45 02/01/2018   RETICCTABS 65.9 06/12/2014   Lab Results  Component Value Date   KAPLAMBRATIO 0.91 04/27/2016   Lab Results  Component Value Date   IGGSERUM 897 04/27/2016   IGA 339 02/27/2014   IGMSERUM 132 04/27/2016   Lab Results  Component Value Date   MSPIKE Not Observed 04/27/2016     Chemistry      Component Value Date/Time   NA 137 02/01/2018 1257   NA 144 01/20/2017 1344   NA 138 01/17/2016 1356   K 3.9 02/01/2018 1257   K 3.6 01/20/2017 1344   K 4.1 01/17/2016 1356   CL 102 02/01/2018 1257   CL 107 01/20/2017 1344   CO2 27 02/01/2018 1257   CO2 25 01/20/2017 1344   CO2 25 01/17/2016 1356   BUN 14 02/01/2018 1257   BUN 12 01/20/2017 1344   BUN 12.3 01/17/2016 1356   CREATININE 0.66 02/01/2018 1257   CREATININE 0.7 01/20/2017 1344   CREATININE 0.8 01/17/2016 1356      Component Value Date/Time   CALCIUM 8.9 02/01/2018 1257   CALCIUM 8.9 01/20/2017 1344   CALCIUM 9.1 01/17/2016 1356   ALKPHOS 75 02/01/2018 1257   ALKPHOS 68 01/20/2017 1344   ALKPHOS 87 01/17/2016 1356   AST 38 02/01/2018 1257   AST 48 (H) 01/17/2016 1356   ALT 27 02/01/2018 1257   ALT 49 (H) 01/20/2017 1344   ALT 38 01/17/2016 1356   BILITOT 0.7 02/01/2018 1257   BILITOT 0.67 01/17/2016 1356       Impression and Plan: [name] is a very pleasant 73 yo caucasian female with polycythemia, 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  Hct today is 45.4%. We will hold off on phlebotomizing her today per her request and will wait to see what her iron studies show. If needed we will bring her back in for phlebotomy.  She did receive her Zometa today.  We will plan to see her back in another 6 weeks.  She will contact our office with any questions or concerns. We can certainly see her sooner if need be.   Laverna Peace, NP 1/7/20201:36 PM

## 2018-02-01 NOTE — Patient Instructions (Signed)

## 2018-02-02 LAB — IRON AND TIBC
Iron: 76 ug/dL (ref 41–142)
Saturation Ratios: 24 % (ref 21–57)
TIBC: 321 ug/dL (ref 236–444)
UIBC: 245 ug/dL (ref 120–384)

## 2018-02-02 LAB — FERRITIN: Ferritin: 33 ng/mL (ref 11–307)

## 2018-02-17 DIAGNOSIS — L84 Corns and callosities: Secondary | ICD-10-CM | POA: Diagnosis not present

## 2018-02-17 DIAGNOSIS — L820 Inflamed seborrheic keratosis: Secondary | ICD-10-CM | POA: Diagnosis not present

## 2018-02-21 DIAGNOSIS — L718 Other rosacea: Secondary | ICD-10-CM | POA: Diagnosis not present

## 2018-03-15 ENCOUNTER — Inpatient Hospital Stay: Payer: Medicare Other

## 2018-03-15 ENCOUNTER — Other Ambulatory Visit: Payer: Self-pay

## 2018-03-15 ENCOUNTER — Inpatient Hospital Stay: Payer: Medicare Other | Attending: Hematology & Oncology | Admitting: Family

## 2018-03-15 VITALS — BP 119/66 | HR 72 | Temp 98.1°F | Resp 20 | Wt 140.0 lb

## 2018-03-15 DIAGNOSIS — D45 Polycythemia vera: Secondary | ICD-10-CM | POA: Diagnosis not present

## 2018-03-15 DIAGNOSIS — D751 Secondary polycythemia: Secondary | ICD-10-CM

## 2018-03-15 DIAGNOSIS — R197 Diarrhea, unspecified: Secondary | ICD-10-CM | POA: Diagnosis not present

## 2018-03-15 DIAGNOSIS — M810 Age-related osteoporosis without current pathological fracture: Secondary | ICD-10-CM | POA: Diagnosis not present

## 2018-03-15 DIAGNOSIS — Z7982 Long term (current) use of aspirin: Secondary | ICD-10-CM | POA: Insufficient documentation

## 2018-03-15 DIAGNOSIS — D500 Iron deficiency anemia secondary to blood loss (chronic): Secondary | ICD-10-CM

## 2018-03-15 DIAGNOSIS — G713 Mitochondrial myopathy, not elsewhere classified: Secondary | ICD-10-CM | POA: Diagnosis not present

## 2018-03-15 DIAGNOSIS — M8008XG Age-related osteoporosis with current pathological fracture, vertebra(e), subsequent encounter for fracture with delayed healing: Secondary | ICD-10-CM

## 2018-03-15 LAB — CBC WITH DIFFERENTIAL (CANCER CENTER ONLY)
Abs Immature Granulocytes: 0.06 10*3/uL (ref 0.00–0.07)
Basophils Absolute: 0.1 10*3/uL (ref 0.0–0.1)
Basophils Relative: 1 %
Eosinophils Absolute: 0.1 10*3/uL (ref 0.0–0.5)
Eosinophils Relative: 1 %
HCT: 45.1 % (ref 36.0–46.0)
Hemoglobin: 15.2 g/dL — ABNORMAL HIGH (ref 12.0–15.0)
Immature Granulocytes: 1 %
Lymphocytes Relative: 12 %
Lymphs Abs: 0.9 10*3/uL (ref 0.7–4.0)
MCH: 34.2 pg — ABNORMAL HIGH (ref 26.0–34.0)
MCHC: 33.7 g/dL (ref 30.0–36.0)
MCV: 101.3 fL — ABNORMAL HIGH (ref 80.0–100.0)
Monocytes Absolute: 0.7 10*3/uL (ref 0.1–1.0)
Monocytes Relative: 9 %
Neutro Abs: 6 10*3/uL (ref 1.7–7.7)
Neutrophils Relative %: 76 %
Platelet Count: 177 10*3/uL (ref 150–400)
RBC: 4.45 MIL/uL (ref 3.87–5.11)
RDW: 13.1 % (ref 11.5–15.5)
WBC Count: 7.9 10*3/uL (ref 4.0–10.5)
nRBC: 0 % (ref 0.0–0.2)

## 2018-03-15 LAB — CMP (CANCER CENTER ONLY)
ALT: 30 U/L (ref 0–44)
AST: 43 U/L — ABNORMAL HIGH (ref 15–41)
Albumin: 3.8 g/dL (ref 3.5–5.0)
Alkaline Phosphatase: 75 U/L (ref 38–126)
Anion gap: 10 (ref 5–15)
BUN: 11 mg/dL (ref 8–23)
CO2: 27 mmol/L (ref 22–32)
Calcium: 9 mg/dL (ref 8.9–10.3)
Chloride: 100 mmol/L (ref 98–111)
Creatinine: 0.65 mg/dL (ref 0.44–1.00)
GFR, Est AFR Am: >60 mL/min (ref >60)
GFR, Estimated: >60 mL/min (ref >60)
Glucose, Bld: 93 mg/dL (ref 70–99)
Potassium: 4.3 mmol/L (ref 3.5–5.1)
Sodium: 137 mmol/L (ref 135–145)
Total Bilirubin: 0.9 mg/dL (ref 0.3–1.2)
Total Protein: 6.6 g/dL (ref 6.5–8.1)

## 2018-03-15 NOTE — Progress Notes (Signed)
Hematology and Oncology Follow Up Visit  [name] [id number] 13-Mar-1945 73 y.o. 03/15/2018   Principle Diagnosis:  1. 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 Osteoporosis - with fracture at T11  Current Therapy:   1. Phlebotomy to maintain hematocrit below 45% 2. Aspirin 81 mg p.o. Daily  3. IV iron as indicated 4. Zometa IV every 6 months- due again in July 2020   Interim History:  [name] is here with her husband for follow-up. She is doing fairly well but still has some mild fatigue at times.  She is taking her baby aspirin daily.  She states that she has occasional palpitations and chest discomfort 1-2 times day. This is not a new issue.   She does have history of GERD and takes Protonix daily.  She still has some increased SOB with activity due to asthma. She feels that being on a beta blocker for her tremors can sometimes exacerbate this.  She has intermittent right sided abdominal pain she attributes to gall stones.  No fever, chills, n/v, rash or changes in bowel or bladder habits.  She still has diarrhea 3-5 times a week. No episodes of bleeding. Her skin is thin and she does bruise easily on aspirin.  No swelling, tenderness, numbness or tingling in her extremities.  No lymphadenopathy noted on exam.  She still has a good appetite and is hydrating well. Her weight is stable.   ECOG Performance Status: 1 - Symptomatic but completely ambulatory  Medications:  Allergies as of 03/15/2018      Reactions   Dulera [mometasone Furo-formoterol Fum] Cough      Medication List       Accurate as of March 15, 2018  1:42 PM. Always use your most recent med list.        albuterol 108 (90 Base) MCG/ACT inhaler Commonly known as:  PROVENTIL HFA;VENTOLIN HFA Inhale 2 puffs into the lungs every 4 (four) hours as needed  for wheezing or shortness of breath.   Estradiol-Norethindrone Acet 0.5-0.1 MG tablet Take 1 tablet by mouth daily.   LORazepam 0.5 MG tablet Commonly known as:  ATIVAN TAKE 1 TABLET BY MOUTH EVERY NIGHT AT BEDTIME   metroNIDAZOLE 0.75 % cream Commonly known as:  METROCREAM APPLY ON THE FACE BID AS NEEDED FOR FLARES   pantoprazole 40 MG tablet Commonly known as:  PROTONIX take 1 tablet by mouth daily before meals   propranolol 40 MG tablet Commonly known as:  INDERAL Take 40 mg by mouth daily. Take 1/4 of tablet total of 10 mg once a day.   sertraline 100 MG tablet Commonly known as:  ZOLOFT Take 100 mg by mouth daily.   SYMBICORT 80-4.5 MCG/ACT inhaler Generic drug:  budesonide-formoterol INHALE 2 PUFFS BY MOUTH TWICE A DAY   Vitamin D (Ergocalciferol) 1.25 MG (50000 UT) Caps capsule Commonly known as:  DRISDOL Take 1 capsule (50,000 Units total) by mouth every 7 (seven) days. Sundays       Allergies:  Allergies  Allergen Reactions  . Dulera [Mometasone Furo-Formoterol Fum] Cough    Past Medical History, Surgical history, Social history, and Family History were reviewed and updated.  Review of Systems: All other 10 point review of systems is negative.   Physical Exam:  weight is 140 lb (63.5 kg). Her oral temperature is 98.1 F (36.7 C). Her blood pressure is 119/66 and her pulse  is 72. Her respiration is 20 and oxygen saturation is 98%.   Wt Readings from Last 3 Encounters:  03/15/18 140 lb (63.5 kg)  02/01/18 140 lb (63.5 kg)  11/02/17 140 lb 4 oz (63.6 kg)    Ocular: Sclerae unicteric, pupils equal, round and reactive to light Ear-nose-throat: Oropharynx clear, dentition fair Lymphatic: No cervical, supraclavicular or axillary adenopathy Lungs no rales or rhonchi, good excursion bilaterally Heart regular rate and rhythm, no murmur appreciated Abd soft, nontender, positive bowel sounds, no liver or spleen tip palpated on exam, no fluid wave  MSK no  focal spinal tenderness, no joint edema Neuro: non-focal, well-oriented, appropriate affect Breasts: Deferred  Lab Results  Component Value Date   WBC 7.9 03/15/2018   HGB 15.2 (H) 03/15/2018   HCT 45.1 03/15/2018   MCV 101.3 (H) 03/15/2018   PLT 177 03/15/2018   Lab Results  Component Value Date   FERRITIN 33 02/01/2018   IRON 76 02/01/2018   TIBC 321 02/01/2018   UIBC 245 02/01/2018   IRONPCTSAT 24 02/01/2018   Lab Results  Component Value Date   RETICCTPCT 1.5 06/12/2014   RBC 4.45 03/15/2018   RETICCTABS 65.9 06/12/2014   Lab Results  Component Value Date   KAPLAMBRATIO 0.91 04/27/2016   Lab Results  Component Value Date   IGGSERUM 897 04/27/2016   IGA 339 02/27/2014   IGMSERUM 132 04/27/2016   Lab Results  Component Value Date   MSPIKE Not Observed 04/27/2016     Chemistry      Component Value Date/Time   NA 137 02/01/2018 1257   NA 144 01/20/2017 1344   NA 138 01/17/2016 1356   K 3.9 02/01/2018 1257   K 3.6 01/20/2017 1344   K 4.1 01/17/2016 1356   CL 102 02/01/2018 1257   CL 107 01/20/2017 1344   CO2 27 02/01/2018 1257   CO2 25 01/20/2017 1344   CO2 25 01/17/2016 1356   BUN 14 02/01/2018 1257   BUN 12 01/20/2017 1344   BUN 12.3 01/17/2016 1356   CREATININE 0.66 02/01/2018 1257   CREATININE 0.7 01/20/2017 1344   CREATININE 0.8 01/17/2016 1356      Component Value Date/Time   CALCIUM 8.9 02/01/2018 1257   CALCIUM 8.9 01/20/2017 1344   CALCIUM 9.1 01/17/2016 1356   ALKPHOS 75 02/01/2018 1257   ALKPHOS 68 01/20/2017 1344   ALKPHOS 87 01/17/2016 1356   AST 38 02/01/2018 1257   AST 48 (H) 01/17/2016 1356   ALT 27 02/01/2018 1257   ALT 49 (H) 01/20/2017 1344   ALT 38 01/17/2016 1356   BILITOT 0.7 02/01/2018 1257   BILITOT 0.67 01/17/2016 1356       Impression and Plan: [name] is a very pleasant 73 yo caucasian female with polycythemia, 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 Hct is stable 45%, no  phlebotomy needed at this time.  Iron studies are pending. If elevated we will bring her back in for phlebotomy.  We will plan to see her back in another 8 weeks.  She promises to contact our office with any questions or concerns. We can certainly see her sooner if need be.   Laverna Peace, NP 2/18/20201:42 PM

## 2018-03-16 LAB — IRON AND TIBC
Iron: 163 ug/dL — ABNORMAL HIGH (ref 41–142)
Saturation Ratios: 53 % (ref 21–57)
TIBC: 306 ug/dL (ref 236–444)
UIBC: 143 ug/dL (ref 120–384)

## 2018-03-16 LAB — LACTATE DEHYDROGENASE: LDH: 177 U/L (ref 98–192)

## 2018-03-16 LAB — FERRITIN: Ferritin: 66 ng/mL (ref 11–307)

## 2018-04-05 ENCOUNTER — Telehealth: Payer: Self-pay | Admitting: *Deleted

## 2018-04-05 NOTE — Telephone Encounter (Signed)
Patient is calling with complaints of phlebitis. She believes this was triggered when she wore socks to bed. She would like to know what she can do to feel better.  Spoke with Dr Marin Olp. He recommends rest, elevation, and ice. If the problem persists he would like patient to follow up with her PCP. She understands.

## 2018-05-02 ENCOUNTER — Telehealth: Payer: Self-pay | Admitting: *Deleted

## 2018-05-02 ENCOUNTER — Telehealth: Payer: Self-pay | Admitting: Hematology & Oncology

## 2018-05-02 NOTE — Telephone Encounter (Signed)
Unable to reach patient to inform of r/s April appts to June16 at 130 pm per 4/6 sch msg. appt letter mailed

## 2018-05-02 NOTE — Telephone Encounter (Signed)
PAtient would like to reschedule her appointment with Clarise Cruz in one month to avoid Covid exposure.  Ok with Judson Roch.  NP   Message sent to scheduler.

## 2018-05-10 ENCOUNTER — Ambulatory Visit: Payer: Medicare Other | Admitting: Family

## 2018-05-10 ENCOUNTER — Other Ambulatory Visit: Payer: Medicare Other

## 2018-05-18 DIAGNOSIS — R197 Diarrhea, unspecified: Secondary | ICD-10-CM | POA: Diagnosis not present

## 2018-05-18 DIAGNOSIS — D509 Iron deficiency anemia, unspecified: Secondary | ICD-10-CM | POA: Diagnosis not present

## 2018-07-11 ENCOUNTER — Other Ambulatory Visit: Payer: Self-pay | Admitting: Emergency Medicine

## 2018-07-12 ENCOUNTER — Other Ambulatory Visit: Payer: Medicare Other

## 2018-07-12 ENCOUNTER — Ambulatory Visit: Payer: Medicare Other | Admitting: Family

## 2018-08-01 ENCOUNTER — Inpatient Hospital Stay: Payer: Medicare Other | Attending: Hematology & Oncology

## 2018-08-01 ENCOUNTER — Telehealth: Payer: Self-pay | Admitting: Family

## 2018-08-01 ENCOUNTER — Inpatient Hospital Stay (HOSPITAL_BASED_OUTPATIENT_CLINIC_OR_DEPARTMENT_OTHER): Payer: Medicare Other | Admitting: Family

## 2018-08-01 ENCOUNTER — Inpatient Hospital Stay: Payer: Medicare Other

## 2018-08-01 ENCOUNTER — Other Ambulatory Visit: Payer: Self-pay

## 2018-08-01 DIAGNOSIS — G713 Mitochondrial myopathy, not elsewhere classified: Secondary | ICD-10-CM

## 2018-08-01 DIAGNOSIS — D508 Other iron deficiency anemias: Secondary | ICD-10-CM

## 2018-08-01 DIAGNOSIS — M810 Age-related osteoporosis without current pathological fracture: Secondary | ICD-10-CM

## 2018-08-01 DIAGNOSIS — D45 Polycythemia vera: Secondary | ICD-10-CM

## 2018-08-01 DIAGNOSIS — D500 Iron deficiency anemia secondary to blood loss (chronic): Secondary | ICD-10-CM

## 2018-08-01 DIAGNOSIS — M8008XG Age-related osteoporosis with current pathological fracture, vertebra(e), subsequent encounter for fracture with delayed healing: Secondary | ICD-10-CM

## 2018-08-01 DIAGNOSIS — D510 Vitamin B12 deficiency anemia due to intrinsic factor deficiency: Secondary | ICD-10-CM

## 2018-08-01 LAB — CMP (CANCER CENTER ONLY)
ALT: 40 U/L (ref 0–44)
AST: 53 U/L — ABNORMAL HIGH (ref 15–41)
Albumin: 4 g/dL (ref 3.5–5.0)
Alkaline Phosphatase: 82 U/L (ref 38–126)
Anion gap: 9 (ref 5–15)
BUN: 10 mg/dL (ref 8–23)
CO2: 26 mmol/L (ref 22–32)
Calcium: 8.3 mg/dL — ABNORMAL LOW (ref 8.9–10.3)
Chloride: 98 mmol/L (ref 98–111)
Creatinine: 0.53 mg/dL (ref 0.44–1.00)
GFR, Est AFR Am: >60 mL/min (ref >60)
GFR, Estimated: >60 mL/min (ref >60)
Glucose, Bld: 97 mg/dL (ref 70–99)
Potassium: 4.4 mmol/L (ref 3.5–5.1)
Sodium: 133 mmol/L — ABNORMAL LOW (ref 135–145)
Total Bilirubin: 0.8 mg/dL (ref 0.3–1.2)
Total Protein: 7 g/dL (ref 6.5–8.1)

## 2018-08-01 LAB — CBC WITH DIFFERENTIAL (CANCER CENTER ONLY)
Abs Immature Granulocytes: 0.13 10*3/uL — ABNORMAL HIGH (ref 0.00–0.07)
Basophils Absolute: 0.1 10*3/uL (ref 0.0–0.1)
Basophils Relative: 1 %
Eosinophils Absolute: 0.2 10*3/uL (ref 0.0–0.5)
Eosinophils Relative: 2 %
HCT: 46.1 % — ABNORMAL HIGH (ref 36.0–46.0)
Hemoglobin: 15.9 g/dL — ABNORMAL HIGH (ref 12.0–15.0)
Immature Granulocytes: 1 %
Lymphocytes Relative: 11 %
Lymphs Abs: 1.1 10*3/uL (ref 0.7–4.0)
MCH: 35.5 pg — ABNORMAL HIGH (ref 26.0–34.0)
MCHC: 34.5 g/dL (ref 30.0–36.0)
MCV: 102.9 fL — ABNORMAL HIGH (ref 80.0–100.0)
Monocytes Absolute: 0.9 10*3/uL (ref 0.1–1.0)
Monocytes Relative: 9 %
Neutro Abs: 7.7 10*3/uL (ref 1.7–7.7)
Neutrophils Relative %: 76 %
Platelet Count: 162 10*3/uL (ref 150–400)
RBC: 4.48 MIL/uL (ref 3.87–5.11)
RDW: 12.5 % (ref 11.5–15.5)
WBC Count: 10 10*3/uL (ref 4.0–10.5)
nRBC: 0 % (ref 0.0–0.2)

## 2018-08-01 MED ORDER — SODIUM CHLORIDE 0.9 % IV SOLN
Freq: Once | INTRAVENOUS | Status: DC
  Filled 2018-08-01: qty 250

## 2018-08-01 MED ORDER — SODIUM CHLORIDE 0.9 % IV SOLN
INTRAVENOUS | Status: DC
  Administered 2018-08-01: 13:00:00 via INTRAVENOUS
  Filled 2018-08-01 (×2): qty 250

## 2018-08-01 MED ORDER — SODIUM CHLORIDE 0.9 % IV SOLN: 510.0000 mg | Freq: Once | INTRAVENOUS | Status: DC

## 2018-08-01 MED ORDER — ZOLEDRONIC ACID 4 MG/100ML IV SOLN
4.0000 mg | Freq: Once | INTRAVENOUS | Status: AC
  Administered 2018-08-01: 4 mg via INTRAVENOUS
  Filled 2018-08-01: qty 100

## 2018-08-01 NOTE — Progress Notes (Signed)
Per dr Marin Olp, Faythe Ghee for zometa today despite labs

## 2018-08-01 NOTE — Progress Notes (Signed)
Hematology and Oncology Follow Up Visit  [name] [id number] 1945/05/20 73 y.o. 08/01/2018   Principle Diagnosis:  1. 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 Osteoporosis - with fracture at T11  Current Therapy:   1. Phlebotomy to maintain hematocrit below 45% 2. Aspirin 81 mg p.o. Daily  3. IV iron as indicated 4. Zometa IV every 6 months- due again in July 2020   Interim History:  Ms. Steedley is here today for follow-up and phlebotomy. She is symptomatic with fatigue. Hct is 46.1%.  She notes SOB at times due to asthma.  She has occasional episodes of palpitations.  No episodes of bleeding. She does bruise easily but not in excess and is taking aspirin daily.  No fever, chills, n/v, cough, rash, dizziness, abdominal pain or changes in bowel or bladder habits.  No swelling, tenderness, numbness or tingling in her extremities at this time.  She is eating well and staying hydrated. Her weight is stable.   ECOG Performance Status: 1 - Symptomatic but completely ambulatory  Medications:  Allergies as of 08/01/2018      Reactions   Dulera [mometasone Furo-formoterol Fum] Cough      Medication List       Accurate as of August 01, 2018 12:04 PM. If you have any questions, ask your nurse or doctor.        albuterol 108 (90 Base) MCG/ACT inhaler Commonly known as: VENTOLIN HFA Inhale 2 puffs into the lungs every 4 (four) hours as needed for wheezing or shortness of breath.   Estradiol-Norethindrone Acet 0.5-0.1 MG tablet Take 1 tablet by mouth daily.   LORazepam 0.5 MG tablet Commonly known as: ATIVAN TAKE 1 TABLET BY MOUTH EVERY NIGHT AT BEDTIME   metroNIDAZOLE 0.75 % cream Commonly known as: METROCREAM APPLY ON THE FACE BID AS NEEDED FOR FLARES   pantoprazole 40 MG tablet Commonly known as: PROTONIX take 1 tablet by  mouth daily before meals   propranolol 40 MG tablet Commonly known as: INDERAL Take 40 mg by mouth daily. Take 1/4 of tablet total of 10 mg once a day.   sertraline 100 MG tablet Commonly known as: ZOLOFT Take 100 mg by mouth daily.   Symbicort 80-4.5 MCG/ACT inhaler Generic drug: budesonide-formoterol INHALE 2 PUFFS BY MOUTH TWICE A DAY   Vitamin D (Ergocalciferol) 1.25 MG (50000 UT) Caps capsule Commonly known as: DRISDOL Take 1 capsule (50,000 Units total) by mouth every 7 (seven) days. Sundays       Allergies:  Allergies  Allergen Reactions  . Dulera [Mometasone Furo-Formoterol Fum] Cough    Past Medical History, Surgical history, Social history, and Family History were reviewed and updated.  Review of Systems: All other 10 point review of systems is negative.   Physical Exam:  vitals were not taken for this visit.   Wt Readings from Last 3 Encounters:  03/15/18 140 lb (63.5 kg)  02/01/18 140 lb (63.5 kg)  11/02/17 140 lb 4 oz (63.6 kg)    Ocular: Sclerae unicteric, pupils equal, round and reactive to light Ear-nose-throat: Oropharynx clear, dentition fair Lymphatic: No cervical or supraclavicular adenopathy Lungs no rales or rhonchi, good excursion bilaterally Heart regular rate and rhythm, no murmur appreciated Abd soft, nontender, positive bowel sounds, no liver or spleen tip palpated on exam, no fluid wave  MSK no focal spinal tenderness, no joint edema Neuro: non-focal, well-oriented, appropriate affect  Breasts: Deferred   Lab Results  Component Value Date   WBC 10.0 08/01/2018   HGB 15.9 (H) 08/01/2018   HCT 46.1 (H) 08/01/2018   MCV 102.9 (H) 08/01/2018   PLT 162 08/01/2018   Lab Results  Component Value Date   FERRITIN 66 03/15/2018   IRON 163 (H) 03/15/2018   TIBC 306 03/15/2018   UIBC 143 03/15/2018   IRONPCTSAT 53 03/15/2018   Lab Results  Component Value Date   RETICCTPCT 1.5 06/12/2014   RBC 4.48 08/01/2018   RETICCTABS 65.9  06/12/2014   Lab Results  Component Value Date   KAPLAMBRATIO 0.91 04/27/2016   Lab Results  Component Value Date   IGGSERUM 897 04/27/2016   IGA 339 02/27/2014   IGMSERUM 132 04/27/2016   Lab Results  Component Value Date   MSPIKE Not Observed 04/27/2016     Chemistry      Component Value Date/Time   NA 133 (L) 08/01/2018 1101   NA 144 01/20/2017 1344   NA 138 01/17/2016 1356   K 4.4 08/01/2018 1101   K 3.6 01/20/2017 1344   K 4.1 01/17/2016 1356   CL 98 08/01/2018 1101   CL 107 01/20/2017 1344   CO2 26 08/01/2018 1101   CO2 25 01/20/2017 1344   CO2 25 01/17/2016 1356   BUN 10 08/01/2018 1101   BUN 12 01/20/2017 1344   BUN 12.3 01/17/2016 1356   CREATININE 0.53 08/01/2018 1101   CREATININE 0.7 01/20/2017 1344   CREATININE 0.8 01/17/2016 1356      Component Value Date/Time   CALCIUM 8.3 (L) 08/01/2018 1101   CALCIUM 8.9 01/20/2017 1344   CALCIUM 9.1 01/17/2016 1356   ALKPHOS 82 08/01/2018 1101   ALKPHOS 68 01/20/2017 1344   ALKPHOS 87 01/17/2016 1356   AST 53 (H) 08/01/2018 1101   AST 48 (H) 01/17/2016 1356   ALT 40 08/01/2018 1101   ALT 49 (H) 01/20/2017 1344   ALT 38 01/17/2016 1356   BILITOT 0.8 08/01/2018 1101   BILITOT 0.67 01/17/2016 1356       Impression and Plan: Ms. Killilea is a very pleasant 73 yo caucasian female with polycythemia, 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 Hct today is 46.1% and we will proceed with phlebotomy and replacement fluids as planned.  She also received Zometa today as well.  We will plan to see her back in another 2 months.  She will contact our office with any questions or concerns. We can certainly see her sooner if needed.   Laverna Peace, NP 7/6/202012:04 PM

## 2018-08-01 NOTE — Patient Instructions (Signed)
Therapeutic Phlebotomy, Care After This sheet gives you information about how to care for yourself after your procedure. Your health care provider may also give you more specific instructions. If you have problems or questions, contact your health care provider. What can I expect after the procedure? After the procedure, it is common to have:  Light-headedness or dizziness. You may feel faint.  Nausea.  Tiredness (fatigue). Follow these instructions at home: Eating and drinking  Be sure to eat well-balanced meals for the next 24 hours.  Drink enough fluid to keep your urine pale yellow.  Avoid drinking alcohol on the day that you had the procedure. Activity   Return to your normal activities as told by your health care provider. Most people can go back to their normal activities right away.  Avoid activities that take a lot of effort for about 5 hours after the procedure. Athletes should avoid strenuous exercise for at least 12 hours.  Avoid heavy lifting or pulling for about 5 hours after the procedure. Do not lift anything that is heavier than 10 lb (4.5 kg).  Change positions slowly for the remainder of the day. This will help to prevent light-headedness or fainting.  If you feel light-headed, lie down until the feeling goes away. Needle insertion site care   Keep your bandage (dressing) dry. You can remove the bandage after about 5 hours or as told by your health care provider.  If you have bleeding from the needle insertion site, raise (elevate) your arm and press firmly on the site until the bleeding stops.  If you have bruising at the site, apply ice to the area: ? Remove the dressing. ? Put ice in a plastic bag. ? Place a towel between your skin and the bag. ? Leave the ice on for 20 minutes, 2-3 times a day for the first 24 hours.  If the swelling does not go away after 24 hours, apply a warm, moist cloth (warm compress) to the area for 20 minutes, 2-3 times a day.  General instructions  Do not use any products that contain nicotine or tobacco, such as cigarettes and e-cigarettes, for at least 30 minutes after the procedure.  Keep all follow-up visits as told by your health care provider. This is important. You may need to continue having regular therapeutic phlebotomy treatments as directed. Contact a health care provider if you:  Have redness, swelling, or pain at the needle insertion site.  Have fluid or blood coming from the needle insertion site.  Have pus or a bad smell coming from the needle insertion site.  Notice that the needle insertion site feels warm to the touch.  Feel light-headed, dizzy, or nauseous, and the feeling does not go away.  Have new bruising at the needle insertion site.  Feel weaker than normal.  Have a fever or chills. Get help right away if:  You faint.  You have chest pain.  You have trouble breathing.  You have severe nausea or vomiting. Summary  After the procedure, it is common to have some light-headedness, dizziness, nausea, or tiredness (fatigue).  Be sure to eat well-balanced meals for the next 24 hours. Drink enough fluid to keep your urine pale yellow.  Return to your normal activities as told by your health care provider.  Keep all follow-up visits as told by your health care provider. You may need to continue having regular therapeutic phlebotomy treatments as directed. This information is not intended to replace advice given to you   by your health care provider. Make sure you discuss any questions you have with your health care provider. Document Released: 06/16/2010 Document Revised: 01/29/2017 Document Reviewed: 01/28/2017 Elsevier Patient Education  [employer]. Zoledronic Acid injection (Hypercalcemia, Oncology) What is this medicine? ZOLEDRONIC ACID (ZOE le dron ik AS id) lowers the amount of calcium loss from bone. It is used to treat too much calcium in your blood from cancer.  It is also used to prevent complications of cancer that has spread to the bone. This medicine may be used for other purposes; ask your health care provider or pharmacist if you have questions. COMMON BRAND NAME(S): Zometa What should I tell my health care provider before I take this medicine? They need to know if you have any of these conditions:  aspirin-sensitive asthma  cancer, especially if you are receiving medicines used to treat cancer  dental disease or wear dentures  infection  kidney disease  receiving corticosteroids like dexamethasone or prednisone  an unusual or allergic reaction to zoledronic acid, other medicines, foods, dyes, or preservatives  pregnant or trying to get pregnant  breast-feeding How should I use this medicine? This medicine is for infusion into a vein. It is given by a health care professional in a hospital or clinic setting. Talk to your pediatrician regarding the use of this medicine in children. Special care may be needed. Overdosage: If you think you have taken too much of this medicine contact a poison control center or emergency room at once. NOTE: This medicine is only for you. Do not share this medicine with others. What if I miss a dose? It is important not to miss your dose. Call your doctor or health care professional if you are unable to keep an appointment. What may interact with this medicine?  certain antibiotics given by injection  NSAIDs, medicines for pain and inflammation, like ibuprofen or naproxen  some diuretics like bumetanide, furosemide  teriparatide  thalidomide This list may not describe all possible interactions. Give your health care provider a list of all the medicines, herbs, non-prescription drugs, or dietary supplements you use. Also tell them if you smoke, drink alcohol, or use illegal drugs. Some items may interact with your medicine. What should I watch for while using this medicine? Visit your doctor or  health care professional for regular checkups. It may be some time before you see the benefit from this medicine. Do not stop taking your medicine unless your doctor tells you to. Your doctor may order blood tests or other tests to see how you are doing. Women should inform their doctor if they wish to become pregnant or think they might be pregnant. There is a potential for serious side effects to an unborn child. Talk to your health care professional or pharmacist for more information. You should make sure that you get enough calcium and vitamin D while you are taking this medicine. Discuss the foods you eat and the vitamins you take with your health care professional. Some people who take this medicine have severe bone, joint, and/or muscle pain. This medicine may also increase your risk for jaw problems or a broken thigh bone. Tell your doctor right away if you have severe pain in your jaw, bones, joints, or muscles. Tell your doctor if you have any pain that does not go away or that gets worse. Tell your dentist and dental surgeon that you are taking this medicine. You should not have major dental surgery while on this medicine. See  your dentist to have a dental exam and fix any dental problems before starting this medicine. Take good care of your teeth while on this medicine. Make sure you see your dentist for regular follow-up appointments. What side effects may I notice from receiving this medicine? Side effects that you should report to your doctor or health care professional as soon as possible:  allergic reactions like skin rash, itching or hives, swelling of the face, lips, or tongue  anxiety, confusion, or depression  breathing problems  changes in vision  eye pain  feeling faint or lightheaded, falls  jaw pain, especially after dental work  mouth sores  muscle cramps, stiffness, or weakness  redness, blistering, peeling or loosening of the skin, including inside the mouth   trouble passing urine or change in the amount of urine Side effects that usually do not require medical attention (report to your doctor or health care professional if they continue or are bothersome):  bone, joint, or muscle pain  constipation  diarrhea  fever  hair loss  irritation at site where injected  loss of appetite  nausea, vomiting  stomach upset  trouble sleeping  trouble swallowing  weak or tired This list may not describe all possible side effects. Call your doctor for medical advice about side effects. You may report side effects to FDA at 1-800-FDA-1088. Where should I keep my medicine? This drug is given in a hospital or clinic and will not be stored at home. NOTE: This sheet is a summary. It may not cover all possible information. If you have questions about this medicine, talk to your doctor, pharmacist, or health care provider.  2020 Elsevier/Gold Standard (2013-06-10 14:19:39)

## 2018-08-01 NOTE — Telephone Encounter (Signed)
Appointments scheduled avs/calendar printed per 7/6 los

## 2018-08-02 LAB — IRON AND TIBC
Iron: 141 ug/dL (ref 41–142)
Saturation Ratios: 49 % (ref 21–57)
TIBC: 286 ug/dL (ref 236–444)
UIBC: 145 ug/dL (ref 120–384)

## 2018-08-02 LAB — LACTATE DEHYDROGENASE: LDH: 184 U/L (ref 98–192)

## 2018-08-02 LAB — FERRITIN: Ferritin: 146 ng/mL (ref 11–307)

## 2018-08-04 ENCOUNTER — Other Ambulatory Visit: Payer: Self-pay | Admitting: Emergency Medicine

## 2018-08-08 ENCOUNTER — Other Ambulatory Visit: Payer: Self-pay

## 2018-08-08 ENCOUNTER — Encounter: Payer: Self-pay | Admitting: Nurse Practitioner

## 2018-08-08 ENCOUNTER — Telehealth: Payer: Self-pay | Admitting: Emergency Medicine

## 2018-08-08 ENCOUNTER — Ambulatory Visit (INDEPENDENT_AMBULATORY_CARE_PROVIDER_SITE_OTHER): Payer: Medicare Other | Admitting: Nurse Practitioner

## 2018-08-08 DIAGNOSIS — J449 Chronic obstructive pulmonary disease, unspecified: Secondary | ICD-10-CM | POA: Diagnosis not present

## 2018-08-08 MED ORDER — BUDESONIDE-FORMOTEROL FUMARATE 80-4.5 MCG/ACT IN AERO: 2.0000 | INHALATION_SPRAY | Freq: Two times a day (BID) | RESPIRATORY_TRACT | 5 refills | Status: DC

## 2018-08-08 NOTE — Telephone Encounter (Signed)
Called and spoke with pt stating to her since she last saw [facility] 06/2016, we needed to get her scheduled for a visit prior to refilling her symbicort. Pt stated that she had seen another MD at our practice after that visit with RB and I stated to her that visit with MW was due to her having a flare-up with her COPD. Stated to pt that we still needed to get her scheduled for a visit and stated to her that we could make it a phone visit and pt verbalized understanding. appt scheduled for pt with [facility] at 2pm today. Nothing further needed.

## 2018-08-08 NOTE — Patient Instructions (Addendum)
Continue symbicort Continue albuterol as needed Stay active   Follow up: Follow up with Dr. Lamonte Sakai in 6 months or sooner if needed

## 2018-08-08 NOTE — Progress Notes (Signed)
Virtual Visit via Telephone Note  I connected with [name] on 08/08/18 at  2:00 PM EDT by telephone and verified that I am speaking with the correct person using two identifiers.  Location: Patient: home Provider: office   I discussed the limitations, risks, security and privacy concerns of performing an evaluation and management service by telephone and the availability of in person appointments. I also discussed with the patient that there may be a patient responsible charge related to this service. The patient expressed understanding and agreed to proceed.   History of Present Illness: 73 year old female with COPD who is followed by Dr. Lamonte Sakai  Patient still visit today for follow-up visit.  She states that this is been a stable interval for her.  She did have 1 exacerbation about a month ago and was treated with azithromycin and prednisone by Dr. Melvyn Novas.  She states that she recovered well from this.  She does need her Symbicort refilled today.  She states that she rarely ever needs to use her albuterol. Denies f/c/s, n/v/d, hemoptysis, PND, leg swelling.     Observations/Objective:  CXR 07/12/17 - No acute or focal cardiopulmonary abnormality.  PFT Results Latest Ref Rng & Units 01/23/2013  FVC-Pre L 1.62  FVC-Predicted Pre % 56  FVC-Post L 1.91  FVC-Predicted Post % 65  Pre FEV1/FVC % % 54  Post FEV1/FCV % % 61  FEV1-Pre L 0.88  FEV1-Predicted Pre % 39  FEV1-Post L 1.17  DLCO UNC% % 53  DLCO COR %Predicted % 76  TLC L 5.05  TLC % Predicted % 103  RV % Predicted % 154    Assessment and Plan: Patient states that this is been a stable interval for her.  She did have 1 exacerbation about a month ago and was treated with azithromycin and prednisone by Dr. Melvyn Novas.  She states that she recovered well from this.  She does need her Symbicort refilled today.  She states that she rarely ever needs to use her albuterol.   Patient Instructions  Continue symbicort Continue  albuterol as needed Stay active   Follow Up Instructions: Follow up with Dr. Lamonte Sakai in 6 months or sooner if needed    I discussed the assessment and treatment plan with the patient. The patient was provided an opportunity to ask questions and all were answered. The patient agreed with the plan and demonstrated an understanding of the instructions.   The patient was advised to call back or seek an in-person evaluation if the symptoms worsen or if the condition fails to improve as anticipated.  I provided 22 minutes of non-face-to-face time during this encounter.   Fenton Foy, NP

## 2018-08-08 NOTE — Assessment & Plan Note (Addendum)
Patient states that this is been a stable interval for her.  She did have 1 exacerbation about a month ago and was treated with azithromycin and prednisone by Dr. Melvyn Novas.  She states that she recovered well from this.  She does need her Symbicort refilled today.  She states that she rarely ever needs to use her albuterol.   Patient Instructions  Continue symbicort Continue albuterol as needed Stay active   Follow up: Follow up with Dr. Lamonte Sakai in 6 months or sooner if needed

## 2018-09-19 ENCOUNTER — Telehealth: Payer: Self-pay | Admitting: Family

## 2018-09-19 NOTE — Telephone Encounter (Signed)
Called and spoke with patient regarding appointments from 9/8 being moved to 9/9 due to Provider PAL.  She was ok with new date/time

## 2018-10-04 ENCOUNTER — Ambulatory Visit: Payer: Medicare Other | Admitting: Family

## 2018-10-04 ENCOUNTER — Other Ambulatory Visit: Payer: Medicare Other

## 2018-10-04 ENCOUNTER — Ambulatory Visit: Payer: Medicare Other

## 2018-10-05 ENCOUNTER — Ambulatory Visit: Payer: Medicare Other | Admitting: Family

## 2018-10-05 ENCOUNTER — Ambulatory Visit: Payer: Medicare Other

## 2018-10-05 ENCOUNTER — Other Ambulatory Visit: Payer: Medicare Other

## 2018-10-11 ENCOUNTER — Inpatient Hospital Stay (HOSPITAL_BASED_OUTPATIENT_CLINIC_OR_DEPARTMENT_OTHER): Payer: Medicare Other | Admitting: Family

## 2018-10-11 ENCOUNTER — Inpatient Hospital Stay: Payer: Medicare Other

## 2018-10-11 ENCOUNTER — Inpatient Hospital Stay: Payer: Medicare Other | Attending: Hematology & Oncology

## 2018-10-11 ENCOUNTER — Other Ambulatory Visit: Payer: Self-pay

## 2018-10-11 VITALS — BP 125/56 | HR 62 | Resp 18

## 2018-10-11 DIAGNOSIS — Z23 Encounter for immunization: Secondary | ICD-10-CM | POA: Diagnosis not present

## 2018-10-11 DIAGNOSIS — D45 Polycythemia vera: Secondary | ICD-10-CM

## 2018-10-11 DIAGNOSIS — M810 Age-related osteoporosis without current pathological fracture: Secondary | ICD-10-CM | POA: Diagnosis not present

## 2018-10-11 DIAGNOSIS — Z7982 Long term (current) use of aspirin: Secondary | ICD-10-CM | POA: Diagnosis not present

## 2018-10-11 DIAGNOSIS — D500 Iron deficiency anemia secondary to blood loss (chronic): Secondary | ICD-10-CM

## 2018-10-11 DIAGNOSIS — D508 Other iron deficiency anemias: Secondary | ICD-10-CM

## 2018-10-11 LAB — CMP (CANCER CENTER ONLY)
ALT: 38 U/L (ref 0–44)
AST: 52 U/L — ABNORMAL HIGH (ref 15–41)
Albumin: 3.7 g/dL (ref 3.5–5.0)
Alkaline Phosphatase: 80 U/L (ref 38–126)
Anion gap: 8 (ref 5–15)
BUN: 7 mg/dL — ABNORMAL LOW (ref 8–23)
CO2: 25 mmol/L (ref 22–32)
Calcium: 9 mg/dL (ref 8.9–10.3)
Chloride: 99 mmol/L (ref 98–111)
Creatinine: 0.55 mg/dL (ref 0.44–1.00)
GFR, Est AFR Am: >60 mL/min (ref >60)
GFR, Estimated: >60 mL/min (ref >60)
Glucose, Bld: 107 mg/dL — ABNORMAL HIGH (ref 70–99)
Potassium: 4.2 mmol/L (ref 3.5–5.1)
Sodium: 132 mmol/L — ABNORMAL LOW (ref 135–145)
Total Bilirubin: 0.9 mg/dL (ref 0.3–1.2)
Total Protein: 6.5 g/dL (ref 6.5–8.1)

## 2018-10-11 LAB — CBC WITH DIFFERENTIAL (CANCER CENTER ONLY)
Abs Immature Granulocytes: 0.07 10*3/uL (ref 0.00–0.07)
Basophils Absolute: 0.1 10*3/uL (ref 0.0–0.1)
Basophils Relative: 1 %
Eosinophils Absolute: 0.1 10*3/uL (ref 0.0–0.5)
Eosinophils Relative: 1 %
HCT: 47.2 % — ABNORMAL HIGH (ref 36.0–46.0)
Hemoglobin: 16.1 g/dL — ABNORMAL HIGH (ref 12.0–15.0)
Immature Granulocytes: 1 %
Lymphocytes Relative: 12 %
Lymphs Abs: 1 10*3/uL (ref 0.7–4.0)
MCH: 35.8 pg — ABNORMAL HIGH (ref 26.0–34.0)
MCHC: 34.1 g/dL (ref 30.0–36.0)
MCV: 104.9 fL — ABNORMAL HIGH (ref 80.0–100.0)
Monocytes Absolute: 0.8 10*3/uL (ref 0.1–1.0)
Monocytes Relative: 10 %
Neutro Abs: 6.3 10*3/uL (ref 1.7–7.7)
Neutrophils Relative %: 75 %
Platelet Count: 156 10*3/uL (ref 150–400)
RBC: 4.5 MIL/uL (ref 3.87–5.11)
RDW: 12.2 % (ref 11.5–15.5)
WBC Count: 8.3 10*3/uL (ref 4.0–10.5)
nRBC: 0 % (ref 0.0–0.2)

## 2018-10-11 MED ORDER — SODIUM CHLORIDE 0.9 % IV SOLN
INTRAVENOUS | Status: DC
  Administered 2018-10-11: 14:00:00 via INTRAVENOUS
  Filled 2018-10-11 (×2): qty 250

## 2018-10-11 MED ORDER — INFLUENZA VAC SPLIT QUAD 0.5 ML IM SUSY
PREFILLED_SYRINGE | INTRAMUSCULAR | Status: AC
  Filled 2018-10-11: qty 0.5

## 2018-10-11 MED ORDER — INFLUENZA VAC SPLIT QUAD 0.5 ML IM SUSY
0.5000 mL | PREFILLED_SYRINGE | Freq: Once | INTRAMUSCULAR | Status: AC
  Administered 2018-10-11: 0.5 mL via INTRAMUSCULAR

## 2018-10-11 NOTE — Progress Notes (Signed)
Patient discharged to home. Patient feels very good.  VSS

## 2018-10-11 NOTE — Progress Notes (Signed)
[name] presents today for phlebotomy per MD orders. Phlebotomy procedure started at 1350 and ended at 1414 when pt had a vasovagal reaction. VS WNL ( see flow sheet). 350 cc removed via iv at L forearm. IV replacement fluids started as ordered. IV needle removed intact.

## 2018-10-11 NOTE — Patient Instructions (Signed)
Therapeutic Phlebotomy Therapeutic phlebotomy is the planned removal of blood from a person's body for the purpose of treating a medical condition. The procedure is similar to donating blood. Usually, about a pint (470 mL, or 0.47 L) of blood is removed. The average adult has 9-12 pints (4.3-5.7 L) of blood in the body. Therapeutic phlebotomy may be used to treat the following medical conditions:  Hemochromatosis. This is a condition in which the blood contains too much iron.  Polycythemia vera. This is a condition in which the blood contains too many red blood cells.  Porphyria cutanea tarda. This is a disease in which an important part of hemoglobin is not made properly. It results in the buildup of abnormal amounts of porphyrins in the body.  Sickle cell disease. This is a condition in which the red blood cells form an abnormal crescent shape rather than a round shape. Tell a health care provider about:  Any allergies you have.  All medicines you are taking, including vitamins, herbs, eye drops, creams, and over-the-counter medicines.  Any problems you or family members have had with anesthetic medicines.  Any blood disorders you have.  Any surgeries you have had.  Any medical conditions you have.  Whether you are pregnant or may be pregnant. What are the risks? Generally, this is a safe procedure. However, problems may occur, including:  Nausea or light-headedness.  Low blood pressure (hypotension).  Soreness, bleeding, swelling, or bruising at the needle insertion site.  Infection. What happens before the procedure?  Follow instructions from your health care provider about eating or drinking restrictions.  Ask your health care provider about: ? Changing or stopping your regular medicines. This is especially important if you are taking diabetes medicines or blood thinners (anticoagulants). ? Taking medicines such as aspirin and ibuprofen. These medicines can thin your  blood. Do not take these medicines unless your health care provider tells you to take them. ? Taking over-the-counter medicines, vitamins, herbs, and supplements.  Wear clothing with sleeves that can be raised above the elbow.  Plan to have someone take you home from the hospital or clinic.  You may have a blood sample taken.  Your blood pressure, pulse rate, and breathing rate will be measured. What happens during the procedure?   To lower your risk of infection: ? Your health care team will wash or sanitize their hands. ? Your skin will be cleaned with an antiseptic.  You may be given a medicine to numb the area (local anesthetic).  A tourniquet will be placed on your arm.  A needle will be inserted into one of your veins.  Tubing and a collection bag will be attached to that needle.  Blood will flow through the needle and tubing into the collection bag.  The collection bag will be placed lower than your arm to allow gravity to help the flow of blood into the bag.  You may be asked to open and close your hand slowly and continually during the entire collection.  After the specified amount of blood has been removed from your body, the collection bag and tubing will be clamped.  The needle will be removed from your vein.  Pressure will be held on the site of the needle insertion to stop the bleeding.  A bandage (dressing) will be placed over the needle insertion site. The procedure may vary among health care providers and hospitals. What happens after the procedure?  Your blood pressure, pulse rate, and breathing rate will be   measured after the procedure.  You will be encouraged to drink fluids.  Your recovery will be assessed and monitored.  You can return to your normal activities as told by your health care provider. Summary  Therapeutic phlebotomy is the planned removal of blood from a person's body for the purpose of treating a medical condition.  Therapeutic  phlebotomy may be used to treat hemochromatosis, polycythemia vera, porphyria cutanea tarda, or sickle cell disease.  In the procedure, a needle is inserted and about a pint (470 mL, or 0.47 L) of blood is removed. The average adult has 9-12 pints (4.3-5.7 L) of blood in the body.  This is generally a safe procedure, but it can sometimes cause problems such as nausea, light-headedness, or low blood pressure (hypotension). This information is not intended to replace advice given to you by your health care provider. Make sure you discuss any questions you have with your health care provider. Document Released: 06/16/2010 Document Revised: 01/28/2017 Document Reviewed: 01/28/2017 Elsevier Patient Education  2020 [employer]  

## 2018-10-11 NOTE — Progress Notes (Signed)
Hematology and Oncology Follow Up Visit  [name] [id number] Oct 26, 1945 73 y.o. 10/11/2018   Principle Diagnosis:  1. 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 Osteoporosis - with fracture at T11  Current Therapy:   1. Phlebotomy to maintain hematocrit below 45% 2. Aspirin 81 mg p.o. Daily  3. IV iron as indicated 4. Zometa IV every 6 months- due again in January 2021   Interim History:  [name] is here today for follow-up and phlebotomy. She is symptomatic with fatigue.  She still has mild SOB with over exertion and will take a break to rest when needed.  She has had some bowel incontinence but has not contacted GI yet. She is taking Lomotil as needed.  She states that she has occasional abdominal pain associated with gall bladder attacks.  No episodes of bleeding, no bruising or petechiae.  She states that her back pain is tremendously improved.  No falls or syncopal episodes.  She uses a cane when ambulating for added support.  No fever, chills, n/v, cough, rash, dizziness, chest (cardiac) pain or changes in bladder habits.   No swelling, tenderness, numbness or tingling in her extremities.   ECOG Performance Status: 1 - Symptomatic but completely ambulatory  Medications:  Allergies as of 10/11/2018      Reactions   Dulera [mometasone Furo-formoterol Fum] Cough      Medication List       Accurate as of October 11, 2018 12:53 PM. If you have any questions, ask your nurse or doctor.        albuterol 108 (90 Base) MCG/ACT inhaler Commonly known as: VENTOLIN HFA Inhale 2 puffs into the lungs every 4 (four) hours as needed for wheezing or shortness of breath.   budesonide-formoterol 80-4.5 MCG/ACT inhaler Commonly known as: Symbicort Inhale 2 puffs into the lungs 2 (two) times daily.   dicyclomine 20 MG tablet  Commonly known as: BENTYL Take 1 tablet by mouth as needed.   Estradiol-Norethindrone Acet 0.5-0.1 MG tablet Take 1 tablet by mouth daily.   LORazepam 0.5 MG tablet Commonly known as: ATIVAN TAKE 1 TABLET BY MOUTH EVERY NIGHT AT BEDTIME   metroNIDAZOLE 0.75 % cream Commonly known as: METROCREAM APPLY ON THE FACE BID AS NEEDED FOR FLARES   pantoprazole 40 MG tablet Commonly known as: PROTONIX take 1 tablet by mouth daily before meals   propranolol 40 MG tablet Commonly known as: INDERAL Take 40 mg by mouth daily. Take 1/2 of tablet total of 10 mg once a day.   sertraline 100 MG tablet Commonly known as: ZOLOFT Take 100 mg by mouth daily.   Vitamin D (Ergocalciferol) 1.25 MG (50000 UT) Caps capsule Commonly known as: DRISDOL Take 1 capsule (50,000 Units total) by mouth every 7 (seven) days. Sundays       Allergies:  Allergies  Allergen Reactions  . Dulera [Mometasone Furo-Formoterol Fum] Cough    Past Medical History, Surgical history, Social history, and Family History were reviewed and updated.  Review of Systems: All other 10 point review of systems is negative.   Physical Exam:  vitals were not taken for this visit.   Wt Readings from Last 3 Encounters:  03/15/18 140 lb (63.5 kg)  02/01/18 140 lb (63.5 kg)  11/02/17 140 lb 4 oz (63.6 kg)    Ocular: Sclerae unicteric, pupils equal, round and reactive to light Ear-nose-throat: Oropharynx clear, dentition  fair Lymphatic: No cervical or supraclavicular adenopathy Lungs no rales or rhonchi, good excursion bilaterally Heart regular rate and rhythm, no murmur appreciated Abd soft, nontender, positive bowel sounds, no liver or spleen tip palpated on exam, no fluid wave  MSK no focal spinal tenderness, no joint edema Neuro: non-focal, well-oriented, appropriate affect Breasts: Deferred   Lab Results  Component Value Date   WBC 8.3 10/11/2018   HGB 16.1 (H) 10/11/2018   HCT 47.2 (H) 10/11/2018   MCV 104.9  (H) 10/11/2018   PLT 156 10/11/2018   Lab Results  Component Value Date   FERRITIN 146 08/01/2018   IRON 141 08/01/2018   TIBC 286 08/01/2018   UIBC 145 08/01/2018   IRONPCTSAT 49 08/01/2018   Lab Results  Component Value Date   RETICCTPCT 1.5 06/12/2014   RBC 4.50 10/11/2018   RETICCTABS 65.9 06/12/2014   Lab Results  Component Value Date   KAPLAMBRATIO 0.91 04/27/2016   Lab Results  Component Value Date   IGGSERUM 897 04/27/2016   IGA 339 02/27/2014   IGMSERUM 132 04/27/2016   Lab Results  Component Value Date   MSPIKE Not Observed 04/27/2016     Chemistry      Component Value Date/Time   NA 132 (L) 10/11/2018 1139   NA 144 01/20/2017 1344   NA 138 01/17/2016 1356   K 4.2 10/11/2018 1139   K 3.6 01/20/2017 1344   K 4.1 01/17/2016 1356   CL 99 10/11/2018 1139   CL 107 01/20/2017 1344   CO2 25 10/11/2018 1139   CO2 25 01/20/2017 1344   CO2 25 01/17/2016 1356   BUN 7 (L) 10/11/2018 1139   BUN 12 01/20/2017 1344   BUN 12.3 01/17/2016 1356   CREATININE 0.55 10/11/2018 1139   CREATININE 0.7 01/20/2017 1344   CREATININE 0.8 01/17/2016 1356      Component Value Date/Time   CALCIUM 9.0 10/11/2018 1139   CALCIUM 8.9 01/20/2017 1344   CALCIUM 9.1 01/17/2016 1356   ALKPHOS 80 10/11/2018 1139   ALKPHOS 68 01/20/2017 1344   ALKPHOS 87 01/17/2016 1356   AST 52 (H) 10/11/2018 1139   AST 48 (H) 01/17/2016 1356   ALT 38 10/11/2018 1139   ALT 49 (H) 01/20/2017 1344   ALT 38 01/17/2016 1356   BILITOT 0.9 10/11/2018 1139   BILITOT 0.67 01/17/2016 1356       Impression and Plan: [name] is a very pleasant 73 yo caucasian female withpolycythemia, 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  We will proceed with phlebotomy today as planned with replacement fluids for Hct 47.2.  She also requested a flu shot today which we did give.  We will plan to see her back in another 2 months.  She will contact our office with any questions or  concerns. We can certainly see her sooner if needed.   Laverna Peace, NP 9/15/202012:53 PM

## 2018-10-12 LAB — IRON AND TIBC
Iron: 153 ug/dL — ABNORMAL HIGH (ref 41–142)
Saturation Ratios: 55 % (ref 21–57)
TIBC: 280 ug/dL (ref 236–444)
UIBC: 127 ug/dL (ref 120–384)

## 2018-10-12 LAB — FERRITIN: Ferritin: 77 ng/mL (ref 11–307)

## 2018-10-12 LAB — LACTATE DEHYDROGENASE: LDH: 182 U/L (ref 98–192)

## 2018-10-14 IMAGING — US US EXTREM LOW VENOUS*R*
1 series · 13 of 24 positions shown · non-contrast
Comparison: None.

CLINICAL DATA: Right lower extremity pain and edema, primarily
involving the anterior aspect the right shin for the past 2 days.
History of prior DVT and varicose veins. Evaluate for acute or
chronic DVT.



[Series 1: us extrem low venous*right* · 0.06mm/px · 13 of 42 slices shown]
[im 1/42]
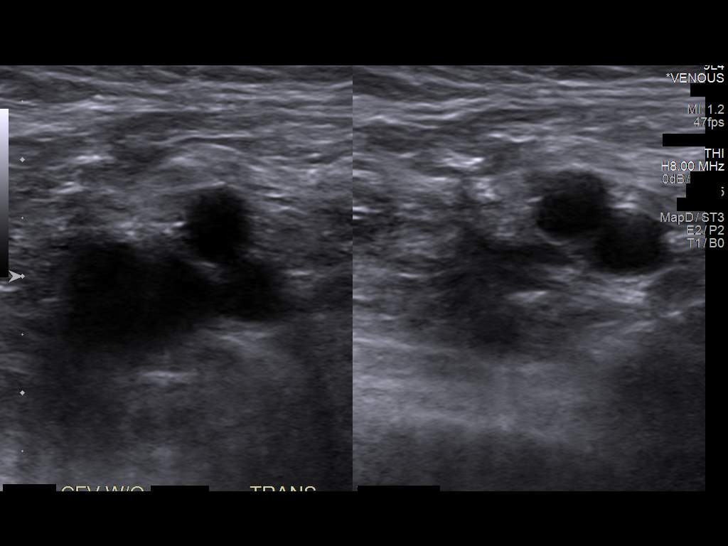
[im 4/42]
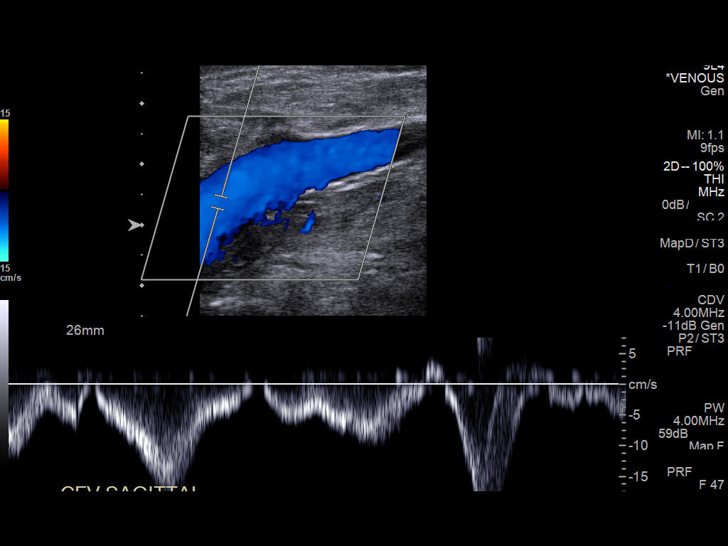
[im 8/42]
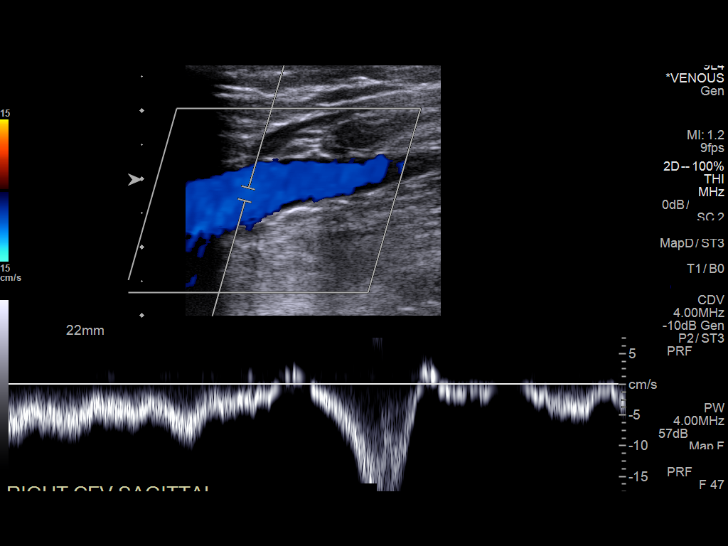
[im 11/42]
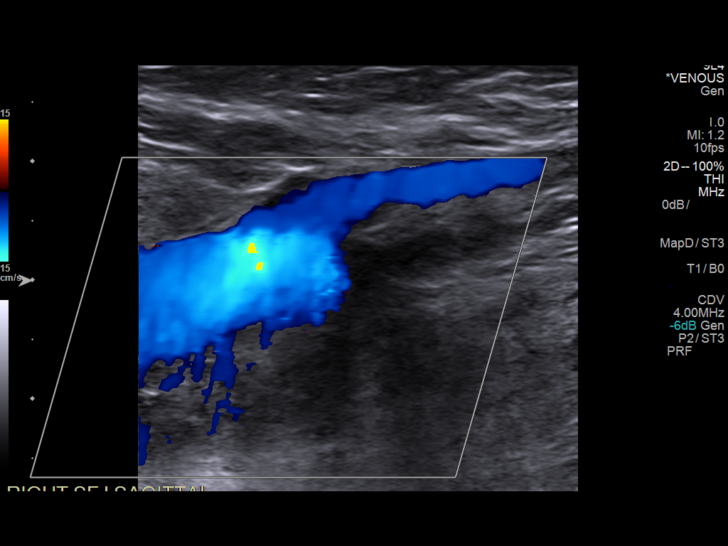
[im 15/42]
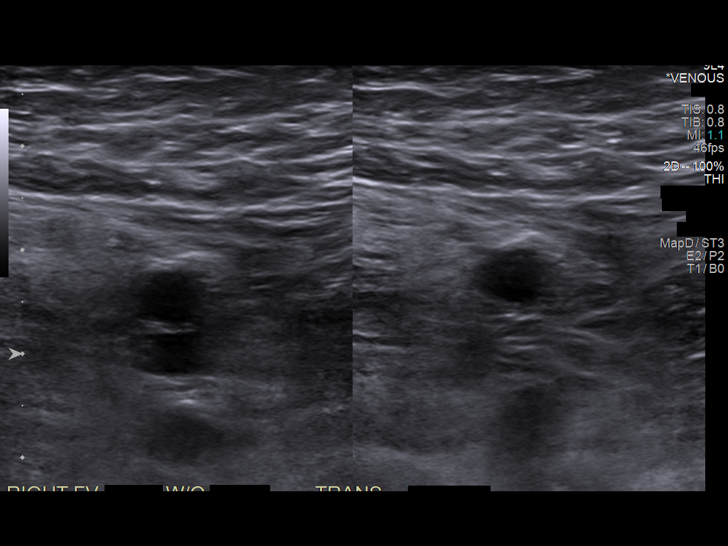
[im 18/42]
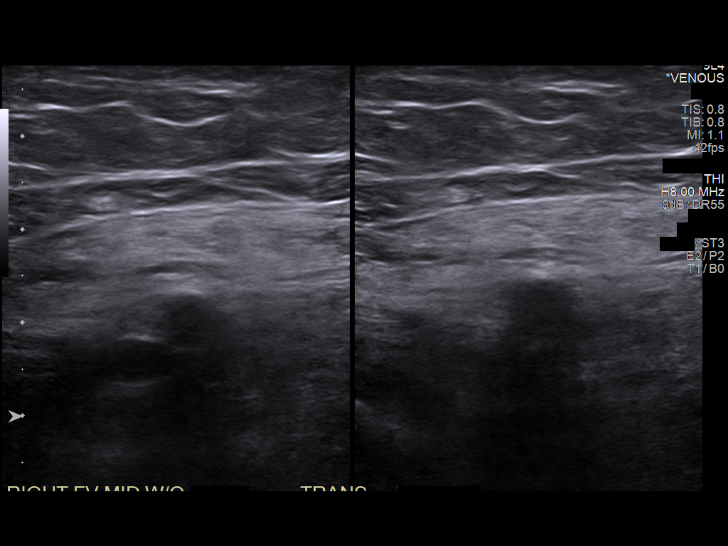
[im 22/42]
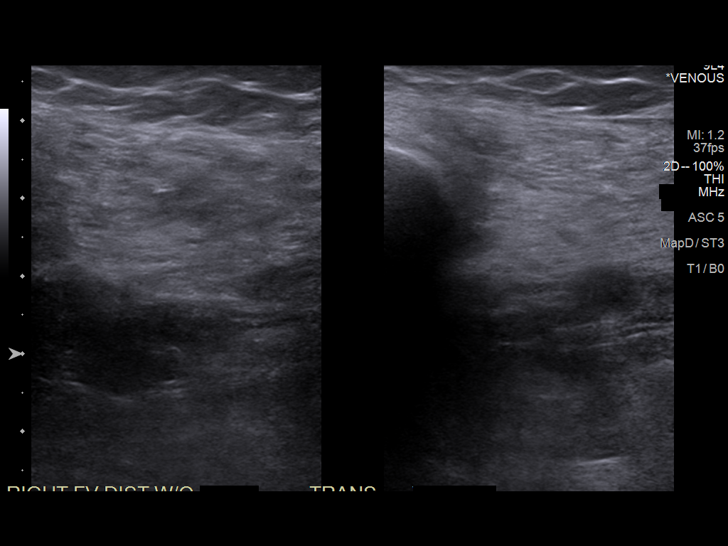
[im 24/42]
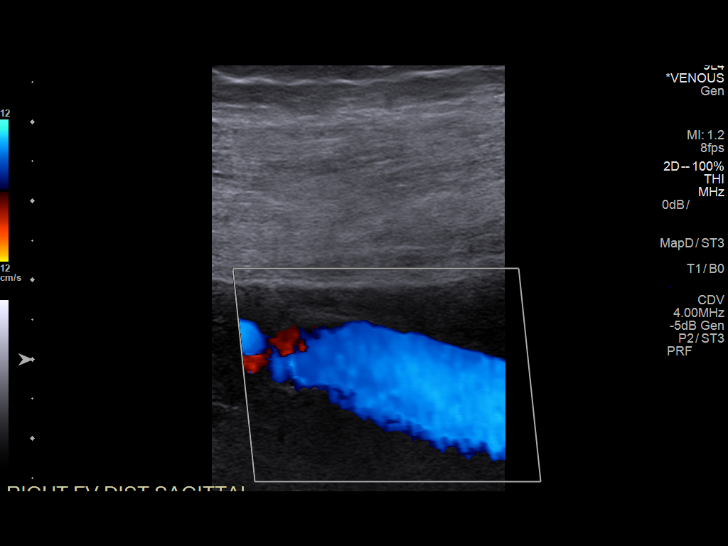
[im 27/42]
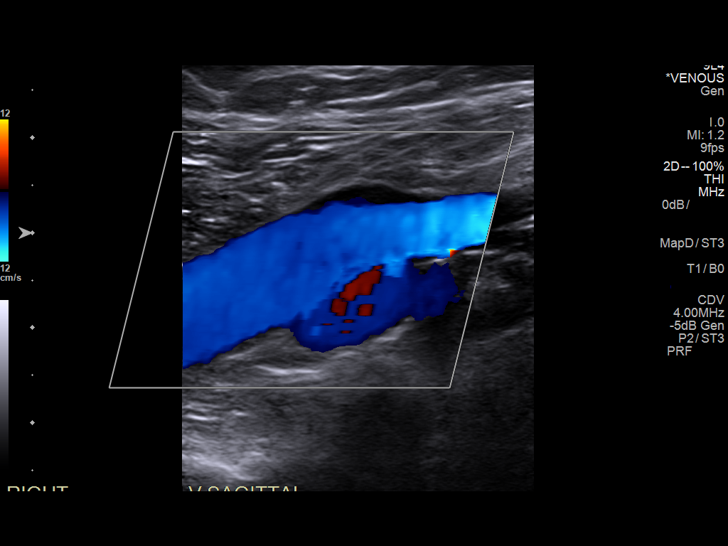
[im 31/42]
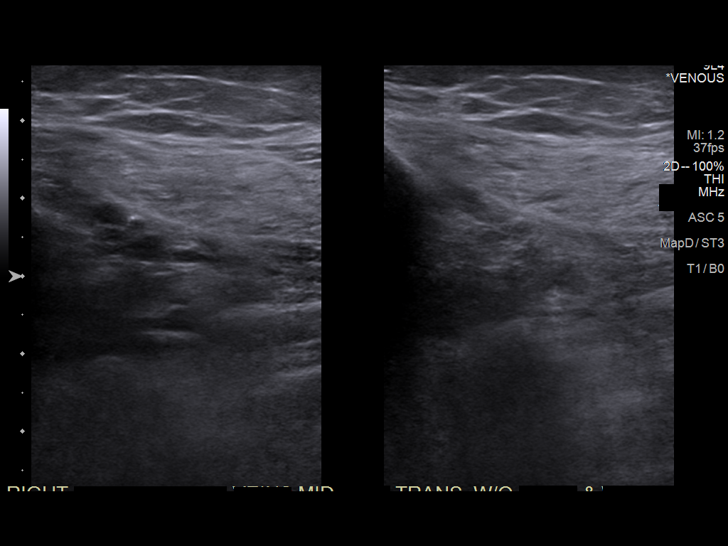
[im 34/42]
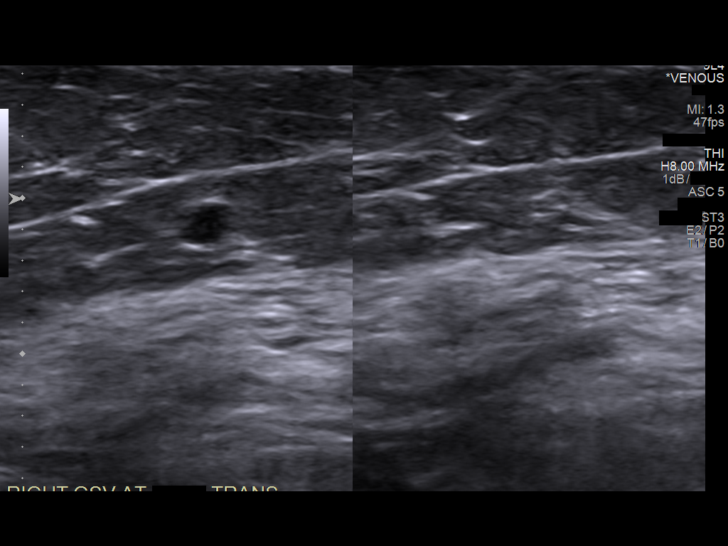
[im 38/42]
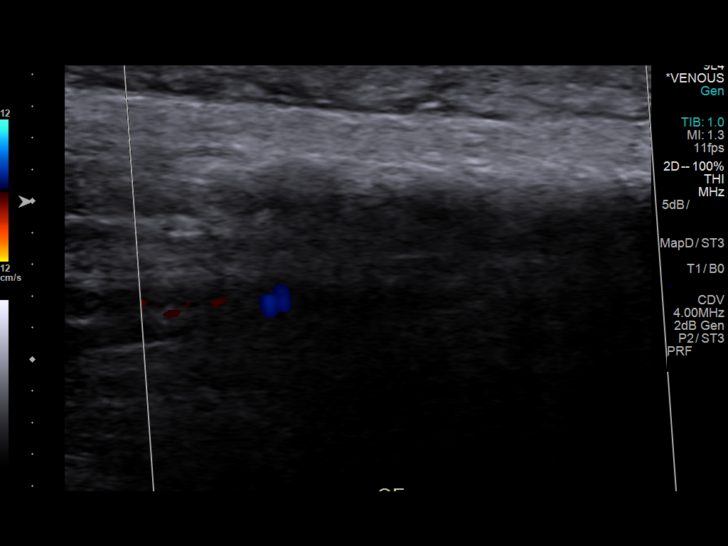
[im 42/42]
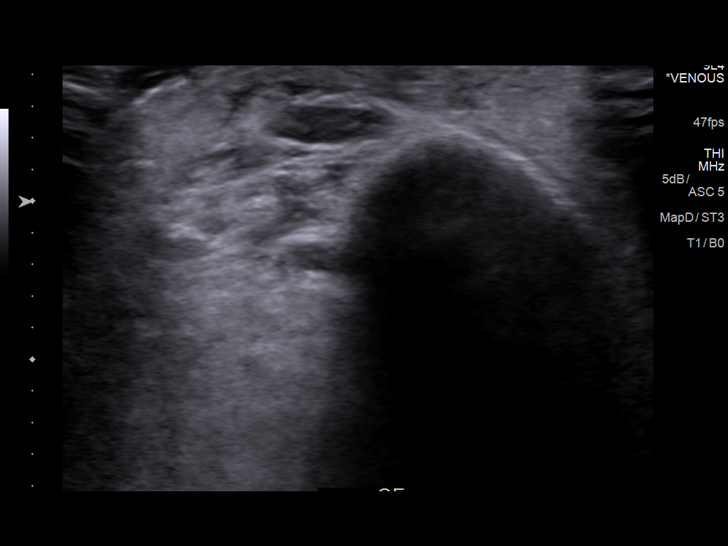

[13 of 24 positions shown; findings below may reference images not displayed]

FINDINGS: Contralateral Common Femoral Vein: Respiratory phasicity is normal
and symmetric with the symptomatic side. No evidence of thrombus.
Normal compressibility.

Common Femoral Vein: No evidence of thrombus. Normal
compressibility, respiratory phasicity and response to augmentation.

Saphenofemoral Junction: No evidence of thrombus. Normal
compressibility and flow on color Doppler imaging.

Profunda Femoral Vein: No evidence of thrombus. Normal
compressibility and flow on color Doppler imaging.

Femoral Vein: No evidence of thrombus. Normal compressibility,
respiratory phasicity and response to augmentation.

Popliteal Vein: No evidence of thrombus. Normal compressibility,
respiratory phasicity and response to augmentation.

Calf Veins: No evidence of thrombus. Normal compressibility and flow
on color Doppler imaging.

Superficial Great Saphenous Vein: No evidence of thrombus. Normal
compressibility and flow on color Doppler imaging.

Venous Reflux:  None.

Other Findings: Additional provided grayscale and color Doppler
images of the patient's palpable area of concern involving the
anterior aspect the right shin are negative for discrete solid or
cystic lesion or evidence of a thrombosed superficial varicosity.
IMPRESSION: 1. No evidence of acute or chronic DVT within the right lower
extremity.
2. No ultrasound correlate for patient's palpable area of concern
involving the anterior aspect of the right shin.

## 2018-12-12 ENCOUNTER — Inpatient Hospital Stay: Payer: Medicare Other | Admitting: Family

## 2018-12-12 ENCOUNTER — Inpatient Hospital Stay: Payer: Medicare Other

## 2018-12-21 ENCOUNTER — Ambulatory Visit: Payer: Medicare Other | Admitting: Family

## 2018-12-21 ENCOUNTER — Other Ambulatory Visit: Payer: Medicare Other

## 2018-12-21 ENCOUNTER — Ambulatory Visit: Payer: Medicare Other

## 2018-12-28 DIAGNOSIS — R7989 Other specified abnormal findings of blood chemistry: Secondary | ICD-10-CM | POA: Diagnosis not present

## 2018-12-28 DIAGNOSIS — M810 Age-related osteoporosis without current pathological fracture: Secondary | ICD-10-CM | POA: Diagnosis not present

## 2018-12-28 DIAGNOSIS — Z79899 Other long term (current) drug therapy: Secondary | ICD-10-CM | POA: Diagnosis not present

## 2018-12-29 ENCOUNTER — Inpatient Hospital Stay (HOSPITAL_BASED_OUTPATIENT_CLINIC_OR_DEPARTMENT_OTHER): Payer: Medicare Other | Admitting: Family

## 2018-12-29 ENCOUNTER — Inpatient Hospital Stay: Payer: Medicare Other

## 2018-12-29 ENCOUNTER — Other Ambulatory Visit: Payer: Self-pay

## 2018-12-29 ENCOUNTER — Inpatient Hospital Stay: Payer: Medicare Other | Attending: Hematology & Oncology

## 2018-12-29 VITALS — BP 147/77 | HR 69 | Resp 17

## 2018-12-29 DIAGNOSIS — M810 Age-related osteoporosis without current pathological fracture: Secondary | ICD-10-CM | POA: Insufficient documentation

## 2018-12-29 DIAGNOSIS — D500 Iron deficiency anemia secondary to blood loss (chronic): Secondary | ICD-10-CM

## 2018-12-29 DIAGNOSIS — D508 Other iron deficiency anemias: Secondary | ICD-10-CM

## 2018-12-29 DIAGNOSIS — D45 Polycythemia vera: Secondary | ICD-10-CM

## 2018-12-29 DIAGNOSIS — D751 Secondary polycythemia: Secondary | ICD-10-CM

## 2018-12-29 LAB — CMP (CANCER CENTER ONLY)
ALT: 42 U/L (ref 0–44)
AST: 56 U/L — ABNORMAL HIGH (ref 15–41)
Albumin: 3.9 g/dL (ref 3.5–5.0)
Alkaline Phosphatase: 104 U/L (ref 38–126)
Anion gap: 9 (ref 5–15)
BUN: 8 mg/dL (ref 8–23)
CO2: 24 mmol/L (ref 22–32)
Calcium: 9.1 mg/dL (ref 8.9–10.3)
Chloride: 102 mmol/L (ref 98–111)
Creatinine: 0.57 mg/dL (ref 0.44–1.00)
GFR, Est AFR Am: >60 mL/min (ref >60)
GFR, Estimated: >60 mL/min (ref >60)
Glucose, Bld: 111 mg/dL — ABNORMAL HIGH (ref 70–99)
Potassium: 4 mmol/L (ref 3.5–5.1)
Sodium: 135 mmol/L (ref 135–145)
Total Bilirubin: 0.9 mg/dL (ref 0.3–1.2)
Total Protein: 7 g/dL (ref 6.5–8.1)

## 2018-12-29 LAB — CBC WITH DIFFERENTIAL (CANCER CENTER ONLY)
Abs Immature Granulocytes: 0.1 10*3/uL — ABNORMAL HIGH (ref 0.00–0.07)
Basophils Absolute: 0.1 10*3/uL (ref 0.0–0.1)
Basophils Relative: 1 %
Eosinophils Absolute: 0.2 10*3/uL (ref 0.0–0.5)
Eosinophils Relative: 2 %
HCT: 46.2 % — ABNORMAL HIGH (ref 36.0–46.0)
Hemoglobin: 15.9 g/dL — ABNORMAL HIGH (ref 12.0–15.0)
Immature Granulocytes: 1 %
Lymphocytes Relative: 11 %
Lymphs Abs: 1.1 10*3/uL (ref 0.7–4.0)
MCH: 35.5 pg — ABNORMAL HIGH (ref 26.0–34.0)
MCHC: 34.4 g/dL (ref 30.0–36.0)
MCV: 103.1 fL — ABNORMAL HIGH (ref 80.0–100.0)
Monocytes Absolute: 0.9 10*3/uL (ref 0.1–1.0)
Monocytes Relative: 8 %
Neutro Abs: 8.5 10*3/uL — ABNORMAL HIGH (ref 1.7–7.7)
Neutrophils Relative %: 77 %
Platelet Count: 155 10*3/uL (ref 150–400)
RBC: 4.48 MIL/uL (ref 3.87–5.11)
RDW: 12.8 % (ref 11.5–15.5)
WBC Count: 10.8 10*3/uL — ABNORMAL HIGH (ref 4.0–10.5)
nRBC: 0 % (ref 0.0–0.2)

## 2018-12-29 MED ORDER — SODIUM CHLORIDE 0.9 % IV SOLN
Freq: Once | INTRAVENOUS | Status: DC
  Filled 2018-12-29: qty 250

## 2018-12-29 NOTE — Progress Notes (Signed)
l °

## 2018-12-29 NOTE — Progress Notes (Signed)
Hematology and Oncology Follow Up Visit  [name]:[id number] 1945-11-28 73 y.o. 12/29/2018   Principle Diagnosis:  1. 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 Osteoporosis - with fracture at T11  Current Therapy:   1. Phlebotomy to maintain hematocrit below 45% 2. Aspirin 81 mg p.o. Daily  3. IV iron as indicated 4. Zometa IV every 6 months- due again in January 2021   Interim History:  [name] is here today for follow-up. She is feeling fatigued and has been having what she states is gallstone pain in her right side off and on.  Hct today is 46.2%.  She is taking her aspirin daily.  No fever, chills, n/v, cough, rash, dizziness, chest pain, palpitations, abdominal pain or changes in bowel or bladder habits.  No swelling, tenderness, numbness or tingling in her extremities.  She ambulates with her cane for added support.  No falls or syncopal episodes to report.  She states that she has a good appetite and is staying well hydrated. Her weight is stable.   ECOG Performance Status: 1 - Symptomatic but completely ambulatory  Medications:  Allergies as of 12/29/2018      Reactions   Dulera [mometasone Furo-formoterol Fum] Cough      Medication List       Accurate as of December 29, 2018  1:33 PM. If you have any questions, ask your nurse or doctor.        albuterol 108 (90 Base) MCG/ACT inhaler Commonly known as: VENTOLIN HFA Inhale 2 puffs into the lungs every 4 (four) hours as needed for wheezing or shortness of breath.   budesonide-formoterol 80-4.5 MCG/ACT inhaler Commonly known as: Symbicort Inhale 2 puffs into the lungs 2 (two) times daily.   dicyclomine 20 MG tablet Commonly known as: BENTYL Take 1 tablet by mouth as needed.   Estradiol-Norethindrone Acet 0.5-0.1 MG tablet Take 1 tablet by mouth daily.    LORazepam 0.5 MG tablet Commonly known as: ATIVAN TAKE 1 TABLET BY MOUTH EVERY NIGHT AT BEDTIME   metroNIDAZOLE 0.75 % cream Commonly known as: METROCREAM APPLY ON THE FACE BID AS NEEDED FOR FLARES   pantoprazole 40 MG tablet Commonly known as: PROTONIX take 1 tablet by mouth daily before meals   propranolol 40 MG tablet Commonly known as: INDERAL Take 40 mg by mouth daily. Take 1/2 of tablet total of 10 mg once a day.   sertraline 100 MG tablet Commonly known as: ZOLOFT Take 100 mg by mouth daily.   Vitamin D (Ergocalciferol) 1.25 MG (50000 UT) Caps capsule Commonly known as: DRISDOL Take 1 capsule (50,000 Units total) by mouth every 7 (seven) days. Sundays       Allergies:  Allergies  Allergen Reactions  . Dulera [Mometasone Furo-Formoterol Fum] Cough    Past Medical History, Surgical history, Social history, and Family History were reviewed and updated.  Review of Systems: All other 10 point review of systems is negative.   Physical Exam:  vitals were not taken for this visit.   Wt Readings from Last 3 Encounters:  03/15/18 140 lb (63.5 kg)  02/01/18 140 lb (63.5 kg)  11/02/17 140 lb 4 oz (63.6 kg)    Ocular: Sclerae unicteric, pupils equal, round and reactive to light Ear-nose-throat: Oropharynx clear, dentition fair Lymphatic: No cervical or supraclavicular adenopathy Lungs no rales or rhonchi, good excursion bilaterally Heart regular rate and rhythm, no  murmur appreciated Abd soft, nontender, positive bowel sounds, no liver or spleen tip palpated on exam, no fluid wave  MSK no focal spinal tenderness, no joint edema Neuro: non-focal, well-oriented, appropriate affect Breasts: Deferred   Lab Results  Component Value Date   WBC 8.3 10/11/2018   HGB 16.1 (H) 10/11/2018   HCT 47.2 (H) 10/11/2018   MCV 104.9 (H) 10/11/2018   PLT 156 10/11/2018   Lab Results  Component Value Date   FERRITIN 77 10/11/2018   IRON 153 (H) 10/11/2018   TIBC 280  10/11/2018   UIBC 127 10/11/2018   IRONPCTSAT 55 10/11/2018   Lab Results  Component Value Date   RETICCTPCT 1.5 06/12/2014   RBC 4.50 10/11/2018   RETICCTABS 65.9 06/12/2014   Lab Results  Component Value Date   KAPLAMBRATIO 0.91 04/27/2016   Lab Results  Component Value Date   IGGSERUM 897 04/27/2016   IGA 339 02/27/2014   IGMSERUM 132 04/27/2016   Lab Results  Component Value Date   MSPIKE Not Observed 04/27/2016     Chemistry      Component Value Date/Time   NA 132 (L) 10/11/2018 1139   NA 144 01/20/2017 1344   NA 138 01/17/2016 1356   K 4.2 10/11/2018 1139   K 3.6 01/20/2017 1344   K 4.1 01/17/2016 1356   CL 99 10/11/2018 1139   CL 107 01/20/2017 1344   CO2 25 10/11/2018 1139   CO2 25 01/20/2017 1344   CO2 25 01/17/2016 1356   BUN 7 (L) 10/11/2018 1139   BUN 12 01/20/2017 1344   BUN 12.3 01/17/2016 1356   CREATININE 0.55 10/11/2018 1139   CREATININE 0.7 01/20/2017 1344   CREATININE 0.8 01/17/2016 1356      Component Value Date/Time   CALCIUM 9.0 10/11/2018 1139   CALCIUM 8.9 01/20/2017 1344   CALCIUM 9.1 01/17/2016 1356   ALKPHOS 80 10/11/2018 1139   ALKPHOS 68 01/20/2017 1344   ALKPHOS 87 01/17/2016 1356   AST 52 (H) 10/11/2018 1139   AST 48 (H) 01/17/2016 1356   ALT 38 10/11/2018 1139   ALT 49 (H) 01/20/2017 1344   ALT 38 01/17/2016 1356   BILITOT 0.9 10/11/2018 1139   BILITOT 0.67 01/17/2016 1356       Impression and Plan: [name] is a very pleasant 73 yo caucasian female withpolycythemia, 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  We will proceed with phlebotomy today and replacement fluids.  We will see her back in another 2 months.  She will contact our office with any questions or concerns. We can certainly see her sooner if needed.   Laverna Peace, NP 12/3/20201:33 PM

## 2018-12-29 NOTE — Patient Instructions (Signed)
Therapeutic Phlebotomy Therapeutic phlebotomy is the planned removal of blood from a person's body for the purpose of treating a medical condition. The procedure is similar to donating blood. Usually, about a pint (470 mL, or 0.47 L) of blood is removed. The average adult has 9-12 pints (4.3-5.7 L) of blood in the body. Therapeutic phlebotomy may be used to treat the following medical conditions:  Hemochromatosis. This is a condition in which the blood contains too much iron.  Polycythemia vera. This is a condition in which the blood contains too many red blood cells.  Porphyria cutanea tarda. This is a disease in which an important part of hemoglobin is not made properly. It results in the buildup of abnormal amounts of porphyrins in the body.  Sickle cell disease. This is a condition in which the red blood cells form an abnormal crescent shape rather than a round shape. Tell a health care provider about:  Any allergies you have.  All medicines you are taking, including vitamins, herbs, eye drops, creams, and over-the-counter medicines.  Any problems you or family members have had with anesthetic medicines.  Any blood disorders you have.  Any surgeries you have had.  Any medical conditions you have.  Whether you are pregnant or may be pregnant. What are the risks? Generally, this is a safe procedure. However, problems may occur, including:  Nausea or light-headedness.  Low blood pressure (hypotension).  Soreness, bleeding, swelling, or bruising at the needle insertion site.  Infection. What happens before the procedure?  Follow instructions from your health care provider about eating or drinking restrictions.  Ask your health care provider about: ? Changing or stopping your regular medicines. This is especially important if you are taking diabetes medicines or blood thinners (anticoagulants). ? Taking medicines such as aspirin and ibuprofen. These medicines can thin your  blood. Do not take these medicines unless your health care provider tells you to take them. ? Taking over-the-counter medicines, vitamins, herbs, and supplements.  Wear clothing with sleeves that can be raised above the elbow.  Plan to have someone take you home from the hospital or clinic.  You may have a blood sample taken.  Your blood pressure, pulse rate, and breathing rate will be measured. What happens during the procedure?   To lower your risk of infection: ? Your health care team will wash or sanitize their hands. ? Your skin will be cleaned with an antiseptic.  You may be given a medicine to numb the area (local anesthetic).  A tourniquet will be placed on your arm.  A needle will be inserted into one of your veins.  Tubing and a collection bag will be attached to that needle.  Blood will flow through the needle and tubing into the collection bag.  The collection bag will be placed lower than your arm to allow gravity to help the flow of blood into the bag.  You may be asked to open and close your hand slowly and continually during the entire collection.  After the specified amount of blood has been removed from your body, the collection bag and tubing will be clamped.  The needle will be removed from your vein.  Pressure will be held on the site of the needle insertion to stop the bleeding.  A bandage (dressing) will be placed over the needle insertion site. The procedure may vary among health care providers and hospitals. What happens after the procedure?  Your blood pressure, pulse rate, and breathing rate will be   measured after the procedure.  You will be encouraged to drink fluids.  Your recovery will be assessed and monitored.  You can return to your normal activities as told by your health care provider. Summary  Therapeutic phlebotomy is the planned removal of blood from a person's body for the purpose of treating a medical condition.  Therapeutic  phlebotomy may be used to treat hemochromatosis, polycythemia vera, porphyria cutanea tarda, or sickle cell disease.  In the procedure, a needle is inserted and about a pint (470 mL, or 0.47 L) of blood is removed. The average adult has 9-12 pints (4.3-5.7 L) of blood in the body.  This is generally a safe procedure, but it can sometimes cause problems such as nausea, light-headedness, or low blood pressure (hypotension). This information is not intended to replace advice given to you by your health care provider. Make sure you discuss any questions you have with your health care provider. Document Released: 06/16/2010 Document Revised: 01/28/2017 Document Reviewed: 01/28/2017 Elsevier Patient Education  2020 [employer]  

## 2018-12-29 NOTE — Progress Notes (Signed)
Patient stuck multiple times for phlebotomy and only able to phlebotomize  200 mls. Unable to get iv for post hydration fluid. Had patient drink water before she left the clinic. Discussed advantages of Port-a-caths, patient will think about this and call if she decides to get one.

## 2018-12-30 LAB — IRON AND TIBC
Iron: 123 ug/dL (ref 28–170)
Saturation Ratios: 38 % — ABNORMAL HIGH (ref 10.4–31.8)
TIBC: 321 ug/dL (ref 250–450)
UIBC: 198 ug/dL

## 2018-12-30 LAB — FERRITIN: Ferritin: 47 ng/mL (ref 11–307)

## 2018-12-30 LAB — LACTATE DEHYDROGENASE: LDH: 236 U/L — ABNORMAL HIGH (ref 98–192)

## 2019-01-03 DIAGNOSIS — E538 Deficiency of other specified B group vitamins: Secondary | ICD-10-CM | POA: Diagnosis not present

## 2019-01-03 DIAGNOSIS — R82998 Other abnormal findings in urine: Secondary | ICD-10-CM | POA: Diagnosis not present

## 2019-01-04 ENCOUNTER — Other Ambulatory Visit: Payer: Self-pay | Admitting: Family

## 2019-02-23 ENCOUNTER — Inpatient Hospital Stay (HOSPITAL_BASED_OUTPATIENT_CLINIC_OR_DEPARTMENT_OTHER): Payer: Medicare Other | Admitting: Family

## 2019-02-23 ENCOUNTER — Inpatient Hospital Stay: Payer: Medicare Other

## 2019-02-23 ENCOUNTER — Inpatient Hospital Stay: Payer: Medicare Other | Attending: Hematology & Oncology

## 2019-02-23 ENCOUNTER — Other Ambulatory Visit: Payer: Self-pay

## 2019-02-23 ENCOUNTER — Encounter: Payer: Self-pay | Admitting: Family

## 2019-02-23 VITALS — BP 117/67 | HR 64 | Resp 17

## 2019-02-23 DIAGNOSIS — G713 Mitochondrial myopathy, not elsewhere classified: Secondary | ICD-10-CM

## 2019-02-23 DIAGNOSIS — D509 Iron deficiency anemia, unspecified: Secondary | ICD-10-CM | POA: Insufficient documentation

## 2019-02-23 DIAGNOSIS — M8008XG Age-related osteoporosis with current pathological fracture, vertebra(e), subsequent encounter for fracture with delayed healing: Secondary | ICD-10-CM

## 2019-02-23 DIAGNOSIS — D751 Secondary polycythemia: Secondary | ICD-10-CM

## 2019-02-23 DIAGNOSIS — M810 Age-related osteoporosis without current pathological fracture: Secondary | ICD-10-CM | POA: Diagnosis not present

## 2019-02-23 DIAGNOSIS — D45 Polycythemia vera: Secondary | ICD-10-CM | POA: Diagnosis not present

## 2019-02-23 DIAGNOSIS — Z7982 Long term (current) use of aspirin: Secondary | ICD-10-CM | POA: Insufficient documentation

## 2019-02-23 DIAGNOSIS — D508 Other iron deficiency anemias: Secondary | ICD-10-CM

## 2019-02-23 DIAGNOSIS — D510 Vitamin B12 deficiency anemia due to intrinsic factor deficiency: Secondary | ICD-10-CM

## 2019-02-23 LAB — CMP (CANCER CENTER ONLY)
ALT: 38 U/L (ref 0–44)
AST: 50 U/L — ABNORMAL HIGH (ref 15–41)
Albumin: 3.9 g/dL (ref 3.5–5.0)
Alkaline Phosphatase: 87 U/L (ref 38–126)
Anion gap: 7 (ref 5–15)
BUN: 9 mg/dL (ref 8–23)
CO2: 29 mmol/L (ref 22–32)
Calcium: 9.4 mg/dL (ref 8.9–10.3)
Chloride: 102 mmol/L (ref 98–111)
Creatinine: 0.63 mg/dL (ref 0.44–1.00)
GFR, Est AFR Am: >60 mL/min (ref >60)
GFR, Estimated: >60 mL/min (ref >60)
Glucose, Bld: 95 mg/dL (ref 70–99)
Potassium: 4.3 mmol/L (ref 3.5–5.1)
Sodium: 138 mmol/L (ref 135–145)
Total Bilirubin: 1 mg/dL (ref 0.3–1.2)
Total Protein: 7.2 g/dL (ref 6.5–8.1)

## 2019-02-23 LAB — CBC WITH DIFFERENTIAL (CANCER CENTER ONLY)
Abs Immature Granulocytes: 0.07 10*3/uL (ref 0.00–0.07)
Basophils Absolute: 0.1 10*3/uL (ref 0.0–0.1)
Basophils Relative: 1 %
Eosinophils Absolute: 0.1 10*3/uL (ref 0.0–0.5)
Eosinophils Relative: 1 %
HCT: 50.4 % — ABNORMAL HIGH (ref 36.0–46.0)
Hemoglobin: 17.1 g/dL — ABNORMAL HIGH (ref 12.0–15.0)
Immature Granulocytes: 1 %
Lymphocytes Relative: 11 %
Lymphs Abs: 1 10*3/uL (ref 0.7–4.0)
MCH: 35.4 pg — ABNORMAL HIGH (ref 26.0–34.0)
MCHC: 33.9 g/dL (ref 30.0–36.0)
MCV: 104.3 fL — ABNORMAL HIGH (ref 80.0–100.0)
Monocytes Absolute: 0.8 10*3/uL (ref 0.1–1.0)
Monocytes Relative: 9 %
Neutro Abs: 7.4 10*3/uL (ref 1.7–7.7)
Neutrophils Relative %: 77 %
Platelet Count: 159 10*3/uL (ref 150–400)
RBC: 4.83 MIL/uL (ref 3.87–5.11)
RDW: 12.3 % (ref 11.5–15.5)
WBC Count: 9.5 10*3/uL (ref 4.0–10.5)
nRBC: 0 % (ref 0.0–0.2)

## 2019-02-23 LAB — LACTATE DEHYDROGENASE: LDH: 181 U/L (ref 98–192)

## 2019-02-23 MED ORDER — ZOLEDRONIC ACID 4 MG/100ML IV SOLN
4.0000 mg | Freq: Once | INTRAVENOUS | Status: AC
  Administered 2019-02-23: 13:00:00 4 mg via INTRAVENOUS
  Filled 2019-02-23: qty 100

## 2019-02-23 MED ORDER — SODIUM CHLORIDE 0.9 % IV SOLN
INTRAVENOUS | Status: DC
  Filled 2019-02-23 (×3): qty 250

## 2019-02-23 NOTE — Progress Notes (Signed)
Hematology and Oncology Follow Up Visit  [name] [id number] 03/01/1945 74 y.o. 02/23/2019   Principle Diagnosis:  1. 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 Osteoporosis - with fracture at T11  Current Therapy:  1. Phlebotomy to maintain hematocrit below 45% 2. Aspirin 81 mg p.o. Daily  3. IV iron as indicated 4. Zometa IV every 6 months- due again inJanuary 2021   Interim History:  [name] is here today for follow-up. She is symptomatic with fatigue and weakness. She did have a fall a couple weeks ago and did bump her forehead. She did not go to the ED due to Covid. She did not feel it was urgent.  She has her "normal headaches" off and on.  She feels that her SOB has been a little better but does have palpitations occasionally.  No fever, chills, n/v, cough, rash, dizziness, SOB, chest pain, palpitations, abdominal pain or changes in bowel or bladder habits.  No swelling, tenderness, numbness or tingling in her extremities.  No syncopal episodes.  She states that her appetite is ok and she is doing her best to stay well hydrated. Her weight is stable at 135 lbs.   ECOG Performance Status: 1 - Symptomatic but completely ambulatory  Medications:  Allergies as of 02/23/2019      Reactions   Dulera [mometasone Furo-formoterol Fum] Cough      Medication List       Accurate as of February 23, 2019 12:09 PM. If you have any questions, ask your nurse or doctor.        albuterol 108 (90 Base) MCG/ACT inhaler Commonly known as: VENTOLIN HFA Inhale 2 puffs into the lungs every 4 (four) hours as needed for wheezing or shortness of breath.   budesonide-formoterol 80-4.5 MCG/ACT inhaler Commonly known as: Symbicort Inhale 2 puffs into the lungs 2 (two) times daily.   dicyclomine 20 MG tablet Commonly known as: BENTYL Take  1 tablet by mouth as needed.   Estradiol-Norethindrone Acet 0.5-0.1 MG tablet Take 1 tablet by mouth daily.   LORazepam 0.5 MG tablet Commonly known as: ATIVAN TAKE 1 TABLET BY MOUTH EVERY NIGHT AT BEDTIME   metroNIDAZOLE 0.75 % cream Commonly known as: METROCREAM APPLY ON THE FACE BID AS NEEDED FOR FLARES   pantoprazole 40 MG tablet Commonly known as: PROTONIX take 1 tablet by mouth daily before meals   propranolol 40 MG tablet Commonly known as: INDERAL Take 40 mg by mouth daily. Take 1/2 of tablet total of 10 mg once a day.   sertraline 100 MG tablet Commonly known as: ZOLOFT Take 100 mg by mouth daily.   Vitamin D (Ergocalciferol) 1.25 MG (50000 UNIT) Caps capsule Commonly known as: DRISDOL Take 1 capsule (50,000 Units total) by mouth every 7 (seven) days. Sundays       Allergies:  Allergies  Allergen Reactions  . Dulera [Mometasone Furo-Formoterol Fum] Cough    Past Medical History, Surgical history, Social history, and Family History were reviewed and updated.  Review of Systems: All other 10 point review of systems is negative.   Physical Exam:  vitals were not taken for this visit.   Wt Readings from Last 3 Encounters:  12/29/18 137 lb 12.8 oz (62.5 kg)  03/15/18 140 lb (63.5 kg)  02/01/18 140 lb (63.5 kg)    Ocular: Sclerae unicteric, pupils equal, round and reactive to light Ear-nose-throat: Oropharynx clear,  dentition fair Lymphatic: No cervical or supraclavicular adenopathy Lungs no rales or rhonchi, good excursion bilaterally Heart regular rate and rhythm, no murmur appreciated Abd soft, nontender, positive bowel sounds, no liver or spleen tip palpated on exam, no fluid wave  MSK no focal spinal tenderness, no joint edema Neuro: non-focal, well-oriented, appropriate affect Breasts: Deferred   Lab Results  Component Value Date   WBC 9.5 02/23/2019   HGB 17.1 (H) 02/23/2019   HCT 50.4 (H) 02/23/2019   MCV 104.3 (H) 02/23/2019   PLT 159  02/23/2019   Lab Results  Component Value Date   FERRITIN 47 12/29/2018   IRON 123 12/29/2018   TIBC 321 12/29/2018   UIBC 198 12/29/2018   IRONPCTSAT 38 (H) 12/29/2018   Lab Results  Component Value Date   RETICCTPCT 1.5 06/12/2014   RBC 4.83 02/23/2019   RETICCTABS 65.9 06/12/2014   Lab Results  Component Value Date   KAPLAMBRATIO 0.91 04/27/2016   Lab Results  Component Value Date   IGGSERUM 897 04/27/2016   IGA 339 02/27/2014   IGMSERUM 132 04/27/2016   Lab Results  Component Value Date   MSPIKE Not Observed 04/27/2016     Chemistry      Component Value Date/Time   NA 135 12/29/2018 1312   NA 144 01/20/2017 1344   NA 138 01/17/2016 1356   K 4.0 12/29/2018 1312   K 3.6 01/20/2017 1344   K 4.1 01/17/2016 1356   CL 102 12/29/2018 1312   CL 107 01/20/2017 1344   CO2 24 12/29/2018 1312   CO2 25 01/20/2017 1344   CO2 25 01/17/2016 1356   BUN 8 12/29/2018 1312   BUN 12 01/20/2017 1344   BUN 12.3 01/17/2016 1356   CREATININE 0.57 12/29/2018 1312   CREATININE 0.7 01/20/2017 1344   CREATININE 0.8 01/17/2016 1356      Component Value Date/Time   CALCIUM 9.1 12/29/2018 1312   CALCIUM 8.9 01/20/2017 1344   CALCIUM 9.1 01/17/2016 1356   ALKPHOS 104 12/29/2018 1312   ALKPHOS 68 01/20/2017 1344   ALKPHOS 87 01/17/2016 1356   AST 56 (H) 12/29/2018 1312   AST 48 (H) 01/17/2016 1356   ALT 42 12/29/2018 1312   ALT 49 (H) 01/20/2017 1344   ALT 38 01/17/2016 1356   BILITOT 0.9 12/29/2018 1312   BILITOT 0.67 01/17/2016 1356       Impression and Plan: [name] is a very pleasant 74 yo caucasian female withpolycythemia, 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 We will proceed with phlebotomy today as planned. Hct is 50.4%. She will get a 1/2 liter IV fluids prior to and then again after if she feels she needs them.  We will plan to see her back in another 6 weeks.  She will contact our office with any questions or concerns. We  can certainly see her sooner if needed.   Laverna Peace, NP 1/28/202112:09 PM

## 2019-02-23 NOTE — Progress Notes (Signed)
Patient requested IV fluids be stopped and she did not want to wait the 30 minutes after her phlebotomy. Instructed her to eat a good supper,  Increase hydration, if dizzy sit down, if it persists, call this office. Patient verbalized understanding.

## 2019-02-23 NOTE — Patient Instructions (Signed)

## 2019-02-23 NOTE — Progress Notes (Signed)
[name] presents today for phlebotomy per MD orders. Phlebotomy procedure started at 1407 and ended at 1447. 427 grams removed from rt AC using 20g IV catheter.  Patient observed for 30 minutes after procedure without any incident. Patient tolerated procedure well. IV needle removed intact.

## 2019-02-24 ENCOUNTER — Telehealth: Payer: Self-pay | Admitting: Hematology & Oncology

## 2019-02-24 LAB — FERRITIN: Ferritin: 56 ng/mL (ref 11–307)

## 2019-02-24 LAB — IRON AND TIBC
Iron: 144 ug/dL — ABNORMAL HIGH (ref 41–142)
Saturation Ratios: 44 % (ref 21–57)
TIBC: 328 ug/dL (ref 236–444)
UIBC: 184 ug/dL (ref 120–384)

## 2019-02-24 NOTE — Telephone Encounter (Signed)
Called and advised patient of appointments added / mailed letter/calendar as well per 1/28 los

## 2019-03-02 ENCOUNTER — Ambulatory Visit: Payer: Medicare Other | Admitting: Hematology & Oncology

## 2019-03-02 ENCOUNTER — Other Ambulatory Visit: Payer: Medicare Other

## 2019-03-17 ENCOUNTER — Other Ambulatory Visit: Payer: Self-pay

## 2019-03-17 ENCOUNTER — Ambulatory Visit (HOSPITAL_BASED_OUTPATIENT_CLINIC_OR_DEPARTMENT_OTHER)
Admission: RE | Admit: 2019-03-17 | Discharge: 2019-03-17 | Disposition: A | Discharge status: Home / Self Care | Payer: Medicare Other | Source: Ambulatory Visit | Attending: Family | Admitting: Family

## 2019-03-17 ENCOUNTER — Telehealth: Payer: Self-pay | Admitting: *Deleted

## 2019-03-17 ENCOUNTER — Other Ambulatory Visit: Payer: Self-pay | Admitting: Family

## 2019-03-17 ENCOUNTER — Encounter (HOSPITAL_BASED_OUTPATIENT_CLINIC_OR_DEPARTMENT_OTHER): Payer: Self-pay | Admitting: *Deleted

## 2019-03-17 ENCOUNTER — Inpatient Hospital Stay: Payer: Medicare Other | Attending: Hematology & Oncology | Admitting: Family

## 2019-03-17 ENCOUNTER — Emergency Department (HOSPITAL_BASED_OUTPATIENT_CLINIC_OR_DEPARTMENT_OTHER)
Admission: EM | Admit: 2019-03-17 | Discharge: 2019-03-17 | Disposition: A | Discharge status: Home / Self Care | Payer: Medicare Other | Attending: Emergency Medicine | Admitting: Emergency Medicine

## 2019-03-17 ENCOUNTER — Telehealth: Payer: Self-pay | Admitting: Family

## 2019-03-17 ENCOUNTER — Emergency Department (HOSPITAL_BASED_OUTPATIENT_CLINIC_OR_DEPARTMENT_OTHER): Payer: Medicare Other

## 2019-03-17 DIAGNOSIS — I2694 Multiple subsegmental pulmonary emboli without acute cor pulmonale: Secondary | ICD-10-CM | POA: Insufficient documentation

## 2019-03-17 DIAGNOSIS — R0789 Other chest pain: Secondary | ICD-10-CM | POA: Diagnosis not present

## 2019-03-17 DIAGNOSIS — J449 Chronic obstructive pulmonary disease, unspecified: Secondary | ICD-10-CM | POA: Insufficient documentation

## 2019-03-17 DIAGNOSIS — I82412 Acute embolism and thrombosis of left femoral vein: Secondary | ICD-10-CM | POA: Diagnosis not present

## 2019-03-17 DIAGNOSIS — D45 Polycythemia vera: Secondary | ICD-10-CM | POA: Insufficient documentation

## 2019-03-17 DIAGNOSIS — N189 Chronic kidney disease, unspecified: Secondary | ICD-10-CM | POA: Diagnosis not present

## 2019-03-17 DIAGNOSIS — R0602 Shortness of breath: Secondary | ICD-10-CM | POA: Diagnosis not present

## 2019-03-17 DIAGNOSIS — F329 Major depressive disorder, single episode, unspecified: Secondary | ICD-10-CM | POA: Insufficient documentation

## 2019-03-17 DIAGNOSIS — I252 Old myocardial infarction: Secondary | ICD-10-CM | POA: Insufficient documentation

## 2019-03-17 DIAGNOSIS — E7211 Homocystinuria: Secondary | ICD-10-CM

## 2019-03-17 DIAGNOSIS — Z79899 Other long term (current) drug therapy: Secondary | ICD-10-CM | POA: Diagnosis not present

## 2019-03-17 DIAGNOSIS — G713 Mitochondrial myopathy, not elsewhere classified: Secondary | ICD-10-CM

## 2019-03-17 DIAGNOSIS — M79605 Pain in left leg: Secondary | ICD-10-CM

## 2019-03-17 DIAGNOSIS — M7989 Other specified soft tissue disorders: Secondary | ICD-10-CM

## 2019-03-17 DIAGNOSIS — Z7951 Long term (current) use of inhaled steroids: Secondary | ICD-10-CM | POA: Insufficient documentation

## 2019-03-17 DIAGNOSIS — R61 Generalized hyperhidrosis: Secondary | ICD-10-CM | POA: Diagnosis not present

## 2019-03-17 DIAGNOSIS — I82452 Acute embolism and thrombosis of left peroneal vein: Secondary | ICD-10-CM | POA: Insufficient documentation

## 2019-03-17 DIAGNOSIS — I82402 Acute embolism and thrombosis of unspecified deep veins of left lower extremity: Secondary | ICD-10-CM | POA: Diagnosis not present

## 2019-03-17 DIAGNOSIS — I82422 Acute embolism and thrombosis of left iliac vein: Secondary | ICD-10-CM

## 2019-03-17 DIAGNOSIS — I2699 Other pulmonary embolism without acute cor pulmonale: Secondary | ICD-10-CM

## 2019-03-17 LAB — URINALYSIS, ROUTINE W REFLEX MICROSCOPIC
Bilirubin Urine: NEGATIVE
Glucose, UA: NEGATIVE mg/dL
Ketones, ur: NEGATIVE mg/dL
Leukocytes,Ua: NEGATIVE
Nitrite: NEGATIVE
Protein, ur: NEGATIVE mg/dL
Specific Gravity, Urine: <1.005 — ABNORMAL LOW (ref 1.005–1.030)
pH: 5.5 (ref 5.0–8.0)

## 2019-03-17 LAB — CBC WITH DIFFERENTIAL/PLATELET
Abs Immature Granulocytes: 0.15 10*3/uL — ABNORMAL HIGH (ref 0.00–0.07)
Basophils Absolute: 0.1 10*3/uL (ref 0.0–0.1)
Basophils Relative: 0 %
Eosinophils Absolute: 0.2 10*3/uL (ref 0.0–0.5)
Eosinophils Relative: 2 %
HCT: 46.1 % — ABNORMAL HIGH (ref 36.0–46.0)
Hemoglobin: 15.3 g/dL — ABNORMAL HIGH (ref 12.0–15.0)
Immature Granulocytes: 1 %
Lymphocytes Relative: 8 %
Lymphs Abs: 0.9 10*3/uL (ref 0.7–4.0)
MCH: 35.3 pg — ABNORMAL HIGH (ref 26.0–34.0)
MCHC: 33.2 g/dL (ref 30.0–36.0)
MCV: 106.2 fL — ABNORMAL HIGH (ref 80.0–100.0)
Monocytes Absolute: 1.3 10*3/uL — ABNORMAL HIGH (ref 0.1–1.0)
Monocytes Relative: 11 %
Neutro Abs: 8.9 10*3/uL — ABNORMAL HIGH (ref 1.7–7.7)
Neutrophils Relative %: 78 %
Platelets: 174 10*3/uL (ref 150–400)
RBC: 4.34 MIL/uL (ref 3.87–5.11)
RDW: 13.3 % (ref 11.5–15.5)
WBC: 11.5 10*3/uL — ABNORMAL HIGH (ref 4.0–10.5)
nRBC: 0 % (ref 0.0–0.2)

## 2019-03-17 LAB — URINALYSIS, MICROSCOPIC (REFLEX)

## 2019-03-17 LAB — COMPREHENSIVE METABOLIC PANEL
ALT: 36 U/L (ref 0–44)
AST: 54 U/L — ABNORMAL HIGH (ref 15–41)
Albumin: 3.3 g/dL — ABNORMAL LOW (ref 3.5–5.0)
Alkaline Phosphatase: 127 U/L — ABNORMAL HIGH (ref 38–126)
Anion gap: 9 (ref 5–15)
BUN: 9 mg/dL (ref 8–23)
CO2: 25 mmol/L (ref 22–32)
Calcium: 8.8 mg/dL — ABNORMAL LOW (ref 8.9–10.3)
Chloride: 100 mmol/L (ref 98–111)
Creatinine, Ser: 0.52 mg/dL (ref 0.44–1.00)
GFR calc Af Amer: >60 mL/min (ref >60)
GFR calc non Af Amer: >60 mL/min (ref >60)
Glucose, Bld: 107 mg/dL — ABNORMAL HIGH (ref 70–99)
Potassium: 3.8 mmol/L (ref 3.5–5.1)
Sodium: 134 mmol/L — ABNORMAL LOW (ref 135–145)
Total Bilirubin: 0.8 mg/dL (ref 0.3–1.2)
Total Protein: 7.6 g/dL (ref 6.5–8.1)

## 2019-03-17 LAB — TROPONIN I (HIGH SENSITIVITY)
Troponin I (High Sensitivity): 3 ng/L (ref <18)
Troponin I (High Sensitivity): 4 ng/L (ref <18)

## 2019-03-17 MED ORDER — IOHEXOL 350 MG/ML SOLN
100.0000 mL | Freq: Once | INTRAVENOUS | Status: AC | PRN
  Administered 2019-03-17: 84 mL via INTRAVENOUS

## 2019-03-17 MED ORDER — RIVAROXABAN 15 MG PO TABS
15.0000 mg | ORAL_TABLET | Freq: Once | ORAL | Status: AC
  Administered 2019-03-17: 15 mg via ORAL
  Filled 2019-03-17: qty 1

## 2019-03-17 MED ORDER — RIVAROXABAN (XARELTO) VTE STARTER PACK (15 & 20 MG): ORAL_TABLET | ORAL | 0 refills | Status: DC

## 2019-03-17 NOTE — ED Provider Notes (Signed)
[facility] EMERGENCY DEPARTMENT Provider Note   CSN: [id number] Arrival date & time: 03/17/19  1440     History Chief Complaint  Patient presents with  . Shortness of Breath  . DVT    [name] is a 74 y.o. female.  Pt presents to the ED today from her oncology office.  The pt has a hx of polycythemia vera.  She has had swelling for the LLE for 1-2 weeks.  An US done today showed an extensive and occlusive DVT up to the common and deep femoral veins.  This Korea was d/w Dr. Scot Dock who did not feel thrombolysis was necessary and recommended Xarelto.  This was called in.  Pt then mentioned to her provider that she's also been having sob and cp.  Due to the extensive DVT, she was sent here to eval for PE.  She has not yet had any doses of blood thinner.         Past Medical History:  Diagnosis Date  . Allergy   . Anemia   . Anxiety   . Asthma    DR. BYRUM  . Blood transfusion without reported diagnosis   . Cataract   . Cholelithiasis   . Chronic kidney disease    gallstones   . Compression fracture of body of thoracic vertebra (HCC)   . Depression   . Dyspnea   . Dysrhythmia    palpitations  . GERD (gastroesophageal reflux disease)   . Heart attack ([place])   . Hemorrhoids   . IBS (irritable bowel syndrome)   . Iliotibial band syndrome    left knee  . Migraine   . Mitochondrial myopathy   . Normal coronary arteries    by cardiac catheterization performed by myself 02/25/01  . Osteoporosis   . Pericarditis    age 58  . Pneumothorax   . 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 PVC's (premature ventricular contractions)   . Vitamin D deficiency     Patient Active Problem List   Diagnosis Date Noted  . COPD with acute exacerbation ([place]) 07/12/2017  . SDH (subdural hematoma) ([place]) 02/28/2017  . Osteoporosis with fracture 12/03/2016  . 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 Age-related osteoporosis without current pathological fracture 07/08/2016  . IDA  (iron deficiency anemia) 10/30/2015  . Need for prophylactic vaccination and inoculation against influenza 10/25/2015  . Laryngopharyngeal reflux (LPR) 09/03/2015  . Cough 02/21/2015  . 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 Iron deficiency anemia 10/12/2013  . DOE (dyspnea on exertion) 01/30/2013  . Polycythemia vera ([place]) 06/17/2012  . 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 History of atypical migraine 03/03/2012  . Nuclear cataract 03/03/2012  . Posterior vitreous detachment 03/03/2012  . Low back pain 06/03/2011  . Back pain 06/03/2011  . Chest pain 09/28/2010  . Personal history of endocrine/metabolic/immunity disorder 04/04/2010  . Encounter for long-term (current) use of medications 04/04/2010  . METATARSALGIA 03/18/2010  . IDIOPATHIC OSTEOPOROSIS 03/18/2010  . INSOMNIA UNSPECIFIED 08/13/2009  . Anxiety state 11/29/2008  . ILIOTIBIAL BAND SYNDROME, LEFT KNEE 08/23/2008  . MIGRAINE HEADACHE 07/28/2007  . UNSPECIFIED MYOPATHY 07/28/2007  . 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 TREMOR, ESSENTIAL/FAMILIAL 03/25/2006  . COPD (chronic obstructive pulmonary disease) ([place]) 03/25/2006  . IRRITABLE BOWEL SYNDROME 03/25/2006  . MENOPAUSAL SYNDROME 03/25/2006    Past Surgical History:  Procedure Laterality Date  . APPENDECTOMY    . CARDIAC CATHETERIZATION    . CATARACT EXTRACTION W/ INTRAOCULAR LENS  IMPLANT, BILATERAL    . CESAREAN SECTION  x 1  . COLONOSCOPY    . DILATATION & CURETTAGE/HYSTEROSCOPY WITH MYOSURE N/A 10/29/2014   Procedure: DILATATION & CURETTAGE/HYSTEROSCOPY WITH MYOSURE;  Surgeon: Paula Compton, MD;  Location: [facility];  Service: Gynecology;  Laterality: N/A;  . IR FLUORO GUIDED NEEDLE PLC ASPIRATION/INJECTION LOC  06/10/2016  . IR FLUORO GUIDED NEEDLE PLC ASPIRATION/INJECTION LOC  06/10/2016  . KYPHOPLASTY N/A 05/14/2016   Procedure: T 11 KYPHOPLASTY;  Surgeon: Phylliss Bob, MD;  Location: [facility];  Service: Orthopedics;  Laterality: N/A;  T 11 KYPHOPLASTY  . KYPHOPLASTY N/A  06/18/2016   Procedure: THORACIC 12 KYPHOPLASTY;  Surgeon: Phylliss Bob, MD;  Location: [facility];  Service: Orthopedics;  Laterality: N/A;  THORACIC 12 KYPHOPLASTY; REQUEST 1 HOUR AND FLIP ROOM  . MOUTH SURGERY    . NASAL SINUS SURGERY    . NM MYOCAR PERF WALL MOTION  04/27/2006   normal  . US ECHOCARDIOGRAPHY  12/25/2010   normal  . VARICOSE VEIN SURGERY       OB History   No obstetric history on file.     Family History  Problem Relation Age of Onset  . Asthma Mother   . Arthritis Mother   . Depression Mother   . Emphysema Father   . Stroke Father   . Alcohol abuse Father   . Heart attack Father   . Lung cancer Father   . Colon cancer Neg Hx   . Esophageal cancer Neg Hx   . Stomach cancer Neg Hx   . Pancreatic cancer Neg Hx     Social History   Tobacco Use  . Smoking status: Never Smoker  . Smokeless tobacco: Never Used  . Tobacco comment: never used tobacco  Substance Use Topics  . Alcohol use: Yes    Alcohol/week: 2.0 standard drinks    Types: 2 Glasses of wine per week    Comment: occasionally  . Drug use: No    Home Medications Prior to Admission medications   Medication Sig Start Date End Date Taking? Authorizing Provider  albuterol (PROVENTIL HFA;VENTOLIN HFA) 108 (90 Base) MCG/ACT inhaler Inhale 2 puffs into the lungs every 4 (four) hours as needed for wheezing or shortness of breath. 01/15/16   Collene Gobble, MD  budesonide-formoterol (SYMBICORT) 80-4.5 MCG/ACT inhaler Inhale 2 puffs into the lungs 2 (two) times daily. 08/08/18   Fenton Foy, NP  dicyclomine (BENTYL) 20 MG tablet Take 1 tablet by mouth as needed. 05/18/18   [provider]  LORazepam (ATIVAN) 0.5 MG tablet TAKE 1 TABLET BY MOUTH EVERY NIGHT AT BEDTIME 05/01/14   Leandrew Koyanagi, MD  metroNIDAZOLE (METROCREAM) 0.75 % cream APPLY ON THE FACE BID AS NEEDED FOR FLARES 02/21/18   [provider]  pantoprazole (PROTONIX) 40 MG tablet take 1 tablet by mouth daily before  meals 10/13/16   [provider]  propranolol (INDERAL) 40 MG tablet Take 40 mg by mouth daily. Take 1/2 of tablet total of 10 mg once a day.    [provider]  Rivaroxaban 15 & 20 MG TBPK Follow package directions: Take one 15mg  tablet by mouth twice a day. On day 22, switch to one 20mg  tablet once a day. Take with food. 03/17/19   [place], Holli Humbles, NP  sertraline (ZOLOFT) 100 MG tablet Take 100 mg by mouth daily. 05/24/17   [provider]  Vitamin D, Ergocalciferol, (DRISDOL) 50000 units CAPS capsule Take 1 capsule (50,000 Units total) by mouth every 7 (seven) days. Sundays 07/08/16  [name], MD    Allergies    Ruthe Mannan [mometasone furo-formoterol fum]  Review of Systems   Review of Systems  Respiratory: Positive for shortness of breath.   Musculoskeletal:       LLE swelling  All other systems reviewed and are negative.   Physical Exam Updated Vital Signs BP (!) 148/85   Pulse 88   Temp 98 F (36.7 C) (Oral)   Resp (!) 23   Ht 5\' 3"  (1.6 m)   Wt 61.2 kg   SpO2 97%   BMI 23.91 kg/m   Physical Exam Vitals and nursing note reviewed.  Constitutional:      Appearance: She is well-developed.  HENT:     Head: Normocephalic and atraumatic.     Mouth/Throat:     Mouth: Mucous membranes are moist.     Pharynx: Oropharynx is clear.  Eyes:     Extraocular Movements: Extraocular movements intact.     Pupils: Pupils are equal, round, and reactive to light.  Cardiovascular:     Rate and Rhythm: Normal rate and regular rhythm.  Pulmonary:     Effort: Pulmonary effort is normal.     Breath sounds: Normal breath sounds.  Abdominal:     General: Bowel sounds are normal.     Palpations: Abdomen is soft.  Musculoskeletal:        General: Normal range of motion.     Cervical back: Normal range of motion and neck supple.     Left lower leg: Tenderness present. Edema present.  Skin:    General: Skin is warm.     Capillary Refill: Capillary refill  takes less than 2 seconds.  Neurological:     General: No focal deficit present.     Mental Status: She is alert and oriented to person, place, and time.  Psychiatric:        Mood and Affect: Mood normal.        Behavior: Behavior normal.     ED Results / Procedures / Treatments   Labs (all labs ordered are listed, but only abnormal results are displayed) Labs Reviewed  COMPREHENSIVE METABOLIC PANEL - Abnormal; Notable for the following components:      Result Value   Sodium 134 (*)    Glucose, Bld 107 (*)    Calcium 8.8 (*)    Albumin 3.3 (*)    AST 54 (*)    Alkaline Phosphatase 127 (*)    All other components within normal limits  CBC WITH DIFFERENTIAL/PLATELET - Abnormal; Notable for the following components:   WBC 11.5 (*)    Hemoglobin 15.3 (*)    HCT 46.1 (*)    MCV 106.2 (*)    MCH 35.3 (*)    Neutro Abs 8.9 (*)    Monocytes Absolute 1.3 (*)    Abs Immature Granulocytes 0.15 (*)    All other components within normal limits  URINALYSIS, ROUTINE W REFLEX MICROSCOPIC - Abnormal; Notable for the following components:   Specific Gravity, Urine <1.005 (*)    Hgb urine dipstick TRACE (*)    All other components within normal limits  URINALYSIS, MICROSCOPIC (REFLEX) - Abnormal; Notable for the following components:   Bacteria, UA FEW (*)    All other components within normal limits  TROPONIN I (HIGH SENSITIVITY)  TROPONIN I (HIGH SENSITIVITY)    EKG None  Radiology US Venous Img Lower Unilateral Left (DVT)  Result Date: 03/17/2019 CLINICAL DATA:  Left lower extremity pain and edema.  History of previous DVT. Evaluate for acute or chronic DVT. EXAM: LEFT LOWER EXTREMITY VENOUS DOPPLER ULTRASOUND TECHNIQUE: Gray-scale sonography with graded compression, as well as color Doppler and duplex ultrasound were performed to evaluate the lower extremity deep venous systems from the level of the common femoral vein and including the common femoral, femoral, profunda femoral,  popliteal and calf veins including the posterior tibial, peroneal and gastrocnemius veins when visible. The superficial great saphenous vein was also interrogated. Spectral Doppler was utilized to evaluate flow at rest and with distal augmentation maneuvers in the common femoral, femoral and popliteal veins. COMPARISON:  None. FINDINGS: Contralateral Common Femoral Vein: Respiratory phasicity is normal and symmetric with the symptomatic side. No evidence of thrombus. Normal compressibility. There is hypoechoic expansile occlusive DVT involving the left common femoral vein (image 2), extending to involve the imaged portions of the left deep femoral vein (image 8) as well as the proximal (images 9 and 16), mid (image 11 and 15) and distal (image 12 and 14) aspect of the left femoral vein, extending to involve the left popliteal vein (images 17 and 18) as well as the left posterior tibial and peroneal veins. Other Findings:  None. IMPRESSION: Examination is positive for extensive occlusive DVT involving the entirety of the imaged portions of the left lower extremity venous system. These results will be called to the ordering clinician or representative by the Radiologist Assistant, and communication documented in the PACS or zVision Dashboard. Electronically Signed   By: Sandi Mariscal M.D.   On: 03/17/2019 14:06   CT Chest:  FINDINGS: Cardiovascular: There is a small volume of embolus in segmental branches in the right chest. Negative for right heart strain. No pericardial effusion. Aortic atherosclerosis is noted. Heart size is mildly enlarged. Few scattered aortic atherosclerotic calcifications are seen.  Mediastinum/Nodes: No enlarged mediastinal, hilar, or axillary lymph nodes. Thyroid gland, trachea, and esophagus demonstrate no significant findings.  Lungs/Pleura: No pleural effusion. Lungs are clear.  Upper Abdomen: Negative.  Musculoskeletal: No acute or focal abnormality. The patient  is status post vertebral augmentation for 2 lower thoracic compression fractures.  Review of the MIP images confirms the above findings.  IMPRESSION: The examination is positive for pulmonary embolus. Negative for right heart strain.  Mild cardiomegaly.  Critical Value/emergent results were called by telephone at the time of interpretation on 03/17/2019 at 5:38 pm to provider Zander Ingham , who verbally acknowledged these results.  Aortic Atherosclerosis (ICD10-I70.0).    Procedures Procedures (including critical care time)  Medications Ordered in ED Medications  iohexol (OMNIPAQUE) 350 MG/ML injection 100 mL (84 mLs Intravenous Contrast Given 03/17/19 1700)  Rivaroxaban (XARELTO) tablet 15 mg (15 mg Oral Given 03/17/19 1819)    ED Course  I have reviewed the triage vital signs and the nursing notes.  Pertinent labs & imaging results that were available during my care of the patient were reviewed by me and considered in my medical decision making (see chart for details).    MDM Rules/Calculators/A&P                     Pt is able to ambulate without her sats dropping.  She is given a first dose of Xarelto in ED.  She has frequent falls, so is told to get up slowly and walk with walker which she has at home.  She lives with her husband who can help her.  Pt did note that she's on HRT for night sweats.  They may  have contributed to her DVT/PE, so she's told to stop them.  Xarelto has already been called in by her oncologist.  Pt knows to return if worse.  Final Clinical Impression(s) / ED Diagnoses Final diagnoses:  Multiple subsegmental pulmonary emboli without acute cor pulmonale (HCC)  DVT of deep femoral vein, left [facility])    Rx / DC Orders ED Discharge Orders    None       Isla Pence, MD 03/17/19 1830

## 2019-03-17 NOTE — Telephone Encounter (Signed)
Report called to ED Charge nurse, updated on patient's current condition and [place] results from earlier this afternoon.

## 2019-03-17 NOTE — ED Notes (Signed)
Ambulated in room with walker. (using cane @ home) Room air SpO2 95-97%, HR 88-95, RR 25-30 +DOE after returning to bed.

## 2019-03-17 NOTE — Telephone Encounter (Signed)
Received call from [place] at [place] that patient is positive for a DVT. Results given to [name] [place],NP.

## 2019-03-17 NOTE — ED Triage Notes (Signed)
She was seen by her MD and told to come to the ED with c.o SOB and DVT.

## 2019-03-17 NOTE — Telephone Encounter (Signed)
Message received from patient stating that she is having pain to her left knee and is requesting an [place] of her leg.  Call placed back to patient and patient states that she is having pain from her left groin to knee, along with swelling from left groin to knee.  Jory Ee NP notified and order placed for [place] of left leg per S. [place] NP.  Call placed back to patient and patient notified that someone from scheduling would be contacting her soon to schedule an [place] of her leg.  Pt appreciative of call.

## 2019-03-17 NOTE — Progress Notes (Addendum)
Hematology and Oncology Follow Up Visit  TASHAUNA RUMAN [id number] [birth year] 74 y.o. 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 Daily  IV iron as indicated Zometa IV every 6 months- due again inJanuary 2021   Interim History:  Ms. Tollefsen is here today after calling with c/o left leg swelling x 1-2 weeks. It has gotten progressively worse and more painful.  We did send her for [place] which showed occlusive DVT involving the left common femoral, deep femoral, proximal, mid, distal aspect of the left femoral, left popliteal, posterior tibial and peroneal veins.  She has swelling in the left leg, no pitting edema or redness at this time. Pedal pulses 2+. We were able to discuss with vascular Dr. Bobette Mo and he did review her scan. Since there was no view of the iliac vein he did not feel thrombolysis was necessary at this time and to proceed with Xarelto started pack. This script was sent to [facility].  Once she arrived in the office she also mentioned that she has noted SOB with any exertion, pain radiating across the right chest over the right breast and occasional dizziness.  This is certainly concerning for PE.  No fever, chills, n/v, cough, rash, palpitations, abdominal pain or changes in bowel or bladder habits.  No falls or syncope to report.  Appetite and hydration patient feels are adequate. No recent infection or travel. No medication changes.   ECOG Performance Status: 1 - Symptomatic but completely ambulatory  Medications:  Allergies as of 03/17/2019      Reactions   Dulera [mometasone Furo-formoterol Fum] Cough      Medication List         Accurate as of March 17, 2019  2:50 PM. If you have any questions, ask your nurse or doctor.        albuterol 108 (90 Base) MCG/ACT inhaler Commonly known as: VENTOLIN HFA Inhale 2 puffs into the lungs every 4 (four) hours as needed for wheezing or shortness of breath.   budesonide-formoterol 80-4.5 MCG/ACT inhaler Commonly known as: Symbicort Inhale 2 puffs into the lungs 2 (two) times daily.   dicyclomine 20 MG tablet Commonly known as: BENTYL Take 1 tablet by mouth as needed.   Estradiol-Norethindrone Acet 0.5-0.1 MG tablet Take 1 tablet by mouth daily.   LORazepam 0.5 MG tablet Commonly known as: ATIVAN TAKE 1 TABLET BY MOUTH EVERY NIGHT AT BEDTIME   metroNIDAZOLE 0.75 % cream Commonly known as: METROCREAM APPLY ON THE FACE BID AS NEEDED FOR FLARES   pantoprazole 40 MG tablet Commonly known as: PROTONIX take 1 tablet by mouth daily before meals   propranolol 40 MG tablet Commonly known as: INDERAL Take 40 mg by mouth daily. Take 1/2 of tablet total of 10 mg once a day.   Rivaroxaban 15 & 20 MG Tbpk Follow package directions: Take one 15mg  tablet by mouth twice a day. On day 22, switch to one 20mg  tablet once a day. Take with food. Started by: Laverna Peace, NP   sertraline 100 MG tablet Commonly known as: ZOLOFT Take 100 mg by mouth daily.   Vitamin D (Ergocalciferol) 1.25 MG (50000 UNIT) Caps capsule Commonly known as: DRISDOL Take 1 capsule (50,000 Units  total) by mouth every 7 (seven) days. Sundays       Allergies:  Allergies  Allergen Reactions  . Dulera [Mometasone Furo-Formoterol Fum] Cough    Past Medical History, Surgical history, Social history, and Family History were reviewed and updated.  Review of Systems: All other 10 point review of systems is negative.   Physical Exam:  vitals were not taken for this visit.   Wt Readings from Last 3 Encounters:  03/17/19 135 lb (61.2 kg)  02/23/19 135 lb 1.3 oz (61.3 kg)  12/29/18  137 lb 12.8 oz (62.5 kg)    Ocular: Sclerae unicteric, pupils equal, round and reactive to light Ear-nose-throat: Oropharynx clear, dentition fair Lymphatic: No cervical or supraclavicular adenopathy Lungs mildly labored, no rales or rhonchi, good excursion bilaterally Heart regular rate and rhythm, no murmur appreciated Abd soft, nontender, positive bowel sounds, No liver or spleen tip palpated on exam, no fluid wave  MSK no focal spinal tenderness, no joint edema Neuro: non-focal, well-oriented, appropriate affect Breasts: Deferred   Lab Results  Component Value Date   WBC 9.5 02/23/2019   HGB 17.1 (H) 02/23/2019   HCT 50.4 (H) 02/23/2019   MCV 104.3 (H) 02/23/2019   PLT 159 02/23/2019   Lab Results  Component Value Date   FERRITIN 56 02/23/2019   IRON 144 (H) 02/23/2019   TIBC 328 02/23/2019   UIBC 184 02/23/2019   IRONPCTSAT 44 02/23/2019   Lab Results  Component Value Date   RETICCTPCT 1.5 06/12/2014   RBC 4.83 02/23/2019   RETICCTABS 65.9 06/12/2014   Lab Results  Component Value Date   KAPLAMBRATIO 0.91 04/27/2016   Lab Results  Component Value Date   IGGSERUM 897 04/27/2016   IGA 339 02/27/2014   IGMSERUM 132 04/27/2016   Lab Results  Component Value Date   MSPIKE Not Observed 04/27/2016     Chemistry      Component Value Date/Time   NA 138 02/23/2019 1152   NA 144 01/20/2017 1344   NA 138 01/17/2016 1356   K 4.3 02/23/2019 1152   K 3.6 01/20/2017 1344   K 4.1 01/17/2016 1356   CL 102 02/23/2019 1152   CL 107 01/20/2017 1344   CO2 29 02/23/2019 1152   CO2 25 01/20/2017 1344   CO2 25 01/17/2016 1356   BUN 9 02/23/2019 1152   BUN 12 01/20/2017 1344   BUN 12.3 01/17/2016 1356   CREATININE 0.63 02/23/2019 1152   CREATININE 0.7 01/20/2017 1344   CREATININE 0.8 01/17/2016 1356      Component Value Date/Time   CALCIUM 9.4 02/23/2019 1152   CALCIUM 8.9 01/20/2017 1344   CALCIUM 9.1 01/17/2016 1356   ALKPHOS 87 02/23/2019 1152   ALKPHOS 68  01/20/2017 1344   ALKPHOS 87 01/17/2016 1356   AST 50 (H) 02/23/2019 1152   AST 48 (H) 01/17/2016 1356   ALT 38 02/23/2019 1152   ALT 49 (H) 01/20/2017 1344   ALT 38 01/17/2016 1356   BILITOT 1.0 02/23/2019 1152   BILITOT 0.67 01/17/2016 1356       Impression and Plan: Ms. Amiri is a very pleasant 74 yo caucasian female withpolycythemia, 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 She is symptomatic today as mentioned above with extensive DVT of the left lower extremity. Her chest discomfort and SOB are concerning for PE.  Report called to ED charge nurse and patient on her way downstairs for further work up.  She has scheduled follow-up on 3/11. This is  subject to change according to what ED work-up reveals.   Laverna Peace, NP 2/19/20212:50 PM

## 2019-03-20 ENCOUNTER — Telehealth: Payer: Self-pay | Admitting: *Deleted

## 2019-03-20 NOTE — Telephone Encounter (Signed)
Message received from patient wanting to know if she needs to come in earlier than her scheduled appt on 04/06/19.  Dr. Marin Olp notified and OK for pt to come in as scheduled on 04/06/19.  Call placed to patient and patient notified that 04/06/19 is appropriate per Dr. Marin Olp.  Pt appreciative of call back and has no further questions or concerns at this time.

## 2019-03-24 DIAGNOSIS — Z23 Encounter for immunization: Secondary | ICD-10-CM | POA: Diagnosis not present

## 2019-03-31 ENCOUNTER — Other Ambulatory Visit: Payer: Self-pay | Admitting: Nurse Practitioner

## 2019-04-04 ENCOUNTER — Telehealth: Payer: Self-pay | Admitting: Emergency Medicine

## 2019-04-04 MED ORDER — ALBUTEROL SULFATE HFA 108 (90 BASE) MCG/ACT IN AERS: 2.0000 | INHALATION_SPRAY | RESPIRATORY_TRACT | 2 refills | Status: DC | PRN

## 2019-04-04 MED ORDER — BUDESONIDE-FORMOTEROL FUMARATE 80-4.5 MCG/ACT IN AERO: 2.0000 | INHALATION_SPRAY | Freq: Two times a day (BID) | RESPIRATORY_TRACT | 2 refills | Status: DC

## 2019-04-04 NOTE — Telephone Encounter (Signed)
Spoke with pt and made f/u appt with Dr Lamonte Sakai on 05/03/19. Refill for Symbicort and Albuterol sent to pharmacy. Nothing further needed.

## 2019-04-05 ENCOUNTER — Other Ambulatory Visit: Payer: Self-pay | Admitting: *Deleted

## 2019-04-06 ENCOUNTER — Other Ambulatory Visit: Payer: Self-pay

## 2019-04-06 ENCOUNTER — Inpatient Hospital Stay (HOSPITAL_BASED_OUTPATIENT_CLINIC_OR_DEPARTMENT_OTHER): Payer: Medicare Other | Admitting: Family

## 2019-04-06 ENCOUNTER — Inpatient Hospital Stay: Payer: Medicare Other | Attending: Hematology & Oncology

## 2019-04-06 ENCOUNTER — Encounter: Payer: Self-pay | Admitting: Family

## 2019-04-06 ENCOUNTER — Inpatient Hospital Stay: Payer: Medicare Other

## 2019-04-06 VITALS — BP 131/66 | HR 71 | Temp 97.5°F | Resp 20 | Ht 62.0 in | Wt 135.0 lb

## 2019-04-06 DIAGNOSIS — I82412 Acute embolism and thrombosis of left femoral vein: Secondary | ICD-10-CM

## 2019-04-06 DIAGNOSIS — Z7901 Long term (current) use of anticoagulants: Secondary | ICD-10-CM | POA: Insufficient documentation

## 2019-04-06 DIAGNOSIS — D45 Polycythemia vera: Secondary | ICD-10-CM | POA: Diagnosis not present

## 2019-04-06 DIAGNOSIS — I2699 Other pulmonary embolism without acute cor pulmonale: Secondary | ICD-10-CM | POA: Insufficient documentation

## 2019-04-06 DIAGNOSIS — I82402 Acute embolism and thrombosis of unspecified deep veins of left lower extremity: Secondary | ICD-10-CM | POA: Diagnosis not present

## 2019-04-06 DIAGNOSIS — D751 Secondary polycythemia: Secondary | ICD-10-CM

## 2019-04-06 DIAGNOSIS — I82422 Acute embolism and thrombosis of left iliac vein: Secondary | ICD-10-CM

## 2019-04-06 DIAGNOSIS — M810 Age-related osteoporosis without current pathological fracture: Secondary | ICD-10-CM | POA: Insufficient documentation

## 2019-04-06 DIAGNOSIS — G713 Mitochondrial myopathy, not elsewhere classified: Secondary | ICD-10-CM

## 2019-04-06 DIAGNOSIS — E7211 Homocystinuria: Secondary | ICD-10-CM

## 2019-04-06 LAB — CBC WITH DIFFERENTIAL (CANCER CENTER ONLY)
Abs Immature Granulocytes: 0.06 10*3/uL (ref 0.00–0.07)
Basophils Absolute: 0.1 10*3/uL (ref 0.0–0.1)
Basophils Relative: 1 %
Eosinophils Absolute: 0.2 10*3/uL (ref 0.0–0.5)
Eosinophils Relative: 2 %
HCT: 43.7 % (ref 36.0–46.0)
Hemoglobin: 14.8 g/dL (ref 12.0–15.0)
Immature Granulocytes: 1 %
Lymphocytes Relative: 10 %
Lymphs Abs: 1 10*3/uL (ref 0.7–4.0)
MCH: 34.4 pg — ABNORMAL HIGH (ref 26.0–34.0)
MCHC: 33.9 g/dL (ref 30.0–36.0)
MCV: 101.6 fL — ABNORMAL HIGH (ref 80.0–100.0)
Monocytes Absolute: 0.8 10*3/uL (ref 0.1–1.0)
Monocytes Relative: 7 %
Neutro Abs: 8.4 10*3/uL — ABNORMAL HIGH (ref 1.7–7.7)
Neutrophils Relative %: 79 %
Platelet Count: 190 10*3/uL (ref 150–400)
RBC: 4.3 MIL/uL (ref 3.87–5.11)
RDW: 12.4 % (ref 11.5–15.5)
WBC Count: 10.5 10*3/uL (ref 4.0–10.5)
nRBC: 0 % (ref 0.0–0.2)

## 2019-04-06 LAB — CMP (CANCER CENTER ONLY)
ALT: 27 U/L (ref 0–44)
AST: 43 U/L — ABNORMAL HIGH (ref 15–41)
Albumin: 3.8 g/dL (ref 3.5–5.0)
Alkaline Phosphatase: 111 U/L (ref 38–126)
Anion gap: 8 (ref 5–15)
BUN: 9 mg/dL (ref 8–23)
CO2: 27 mmol/L (ref 22–32)
Calcium: 9.7 mg/dL (ref 8.9–10.3)
Chloride: 101 mmol/L (ref 98–111)
Creatinine: 0.57 mg/dL (ref 0.44–1.00)
GFR, Est AFR Am: >60 mL/min (ref >60)
GFR, Estimated: >60 mL/min (ref >60)
Glucose, Bld: 109 mg/dL — ABNORMAL HIGH (ref 70–99)
Potassium: 4.2 mmol/L (ref 3.5–5.1)
Sodium: 136 mmol/L (ref 135–145)
Total Bilirubin: 0.8 mg/dL (ref 0.3–1.2)
Total Protein: 7.1 g/dL (ref 6.5–8.1)

## 2019-04-06 LAB — LACTATE DEHYDROGENASE: LDH: 183 U/L (ref 98–192)

## 2019-04-06 LAB — ANTITHROMBIN III: AntiThromb III Func: 95 % (ref 75–120)

## 2019-04-06 MED ORDER — RIVAROXABAN 20 MG PO TABS: 20.0000 mg | ORAL_TABLET | Freq: Every day | ORAL | 4 refills | Status: DC

## 2019-04-06 NOTE — Progress Notes (Signed)
Hematology and Oncology Follow Up Visit  [name] [id number] May 26, 1945 74 y.o. 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 Daily  IV iron as indicated Zometa IV every 6 months- due again inJuly 2021   Interim History:  [name] is here today for follow-up. She is having fatigue but notes that her leg pain is much improved.  She is doing well on Xarelto and will start the 20 mg PO once a day tomorrow.  She has had no issues with bleeding. No bruising or petechiae.  No swelling, numbness or tingling in her extremities at this time.  She has some chest discomfort at times and SOB with over exertion. She takes a break to rest when needed.  She has issues with balance and ambulates with a cane for added support. No falls or syncopal episodes to report.  No fever, chills, n/v, cough, rash, dizziness, palpitations, abdominal pain or changes in bowel or bladder habits.  She is eating well and staying hydrated. Her weight is stable.   ECOG Performance Status: 1 - Symptomatic but completely ambulatory  Medications:  Allergies as of 04/06/2019      Reactions   Dulera [mometasone Furo-formoterol Fum] Cough      Medication List       Accurate as of April 06, 2019 11:39 AM. If you have any questions, ask your nurse or doctor.        albuterol 108 (90 Base) MCG/ACT inhaler Commonly known as: VENTOLIN HFA Inhale 2 puffs into the lungs every 4 (four) hours as needed for wheezing or shortness of breath.   budesonide-formoterol 80-4.5 MCG/ACT inhaler Commonly known as: Symbicort Inhale 2 puffs into the lungs 2  (two) times daily.   dicyclomine 20 MG tablet Commonly known as: BENTYL Take 1 tablet by mouth as needed.   LORazepam 0.5 MG tablet Commonly known as: ATIVAN TAKE 1 TABLET BY MOUTH EVERY NIGHT AT BEDTIME   metroNIDAZOLE 0.75 % cream Commonly known as: METROCREAM APPLY ON THE FACE BID AS NEEDED FOR FLARES   pantoprazole 40 MG tablet Commonly known as: PROTONIX take 1 tablet by mouth daily before meals   propranolol 40 MG tablet Commonly known as: INDERAL Take 40 mg by mouth daily. Take 1/2 of tablet total of 10 mg once a day.   Rivaroxaban 15 & 20 MG Tbpk Follow package directions: Take one 15mg  tablet by mouth twice a day. On day 22, switch to one 20mg  tablet once a day. Take with food.   sertraline 100 MG tablet Commonly known as: ZOLOFT Take 100 mg by mouth daily.   Vitamin D (Ergocalciferol) 1.25 MG (50000 UNIT) Caps capsule Commonly known as: DRISDOL Take 1 capsule (50,000 Units total) by mouth every 7 (seven) days. Sundays       Allergies:  Allergies  Allergen Reactions  . Dulera [Mometasone Furo-Formoterol Fum] Cough    Past Medical History, Surgical history, Social history, and Family History were reviewed and updated.  Review of Systems: All other 10 point review of systems is negative.   Physical Exam:  vitals were not taken for this visit.   Wt Readings from Last 3 Encounters:  03/17/19  135 lb (61.2 kg)  02/23/19 135 lb 1.3 oz (61.3 kg)  12/29/18 137 lb 12.8 oz (62.5 kg)    Ocular: Sclerae unicteric, pupils equal, round and reactive to light Ear-nose-throat: Oropharynx clear, dentition fair Lymphatic: No cervical or supraclavicular adenopathy Lungs no rales or rhonchi, good excursion bilaterally Heart regular rate and rhythm, no murmur appreciated Abd soft, nontender, positive bowel sounds, no liver or spleen tip palpated on exam, no fluid wave  MSK no focal spinal tenderness, no joint edema Neuro: non-focal, well-oriented, appropriate  affect Breasts: Deferred   Lab Results  Component Value Date   WBC 10.5 04/06/2019   HGB 14.8 04/06/2019   HCT 43.7 04/06/2019   MCV 101.6 (H) 04/06/2019   PLT 190 04/06/2019   Lab Results  Component Value Date   FERRITIN 56 02/23/2019   IRON 144 (H) 02/23/2019   TIBC 328 02/23/2019   UIBC 184 02/23/2019   IRONPCTSAT 44 02/23/2019   Lab Results  Component Value Date   RETICCTPCT 1.5 06/12/2014   RBC 4.30 04/06/2019   RETICCTABS 65.9 06/12/2014   Lab Results  Component Value Date   KAPLAMBRATIO 0.91 04/27/2016   Lab Results  Component Value Date   IGGSERUM 897 04/27/2016   IGA 339 02/27/2014   IGMSERUM 132 04/27/2016   Lab Results  Component Value Date   MSPIKE Not Observed 04/27/2016     Chemistry      Component Value Date/Time   NA 134 (L) 03/17/2019 1534   NA 144 01/20/2017 1344   NA 138 01/17/2016 1356   K 3.8 03/17/2019 1534   K 3.6 01/20/2017 1344   K 4.1 01/17/2016 1356   CL 100 03/17/2019 1534   CL 107 01/20/2017 1344   CO2 25 03/17/2019 1534   CO2 25 01/20/2017 1344   CO2 25 01/17/2016 1356   BUN 9 03/17/2019 1534   BUN 12 01/20/2017 1344   BUN 12.3 01/17/2016 1356   CREATININE 0.52 03/17/2019 1534   CREATININE 0.63 02/23/2019 1152   CREATININE 0.7 01/20/2017 1344   CREATININE 0.8 01/17/2016 1356      Component Value Date/Time   CALCIUM 8.8 (L) 03/17/2019 1534   CALCIUM 8.9 01/20/2017 1344   CALCIUM 9.1 01/17/2016 1356   ALKPHOS 127 (H) 03/17/2019 1534   ALKPHOS 68 01/20/2017 1344   ALKPHOS 87 01/17/2016 1356   AST 54 (H) 03/17/2019 1534   AST 50 (H) 02/23/2019 1152   AST 48 (H) 01/17/2016 1356   ALT 36 03/17/2019 1534   ALT 38 02/23/2019 1152   ALT 49 (H) 01/20/2017 1344   ALT 38 01/17/2016 1356   BILITOT 0.8 03/17/2019 1534   BILITOT 1.0 02/23/2019 1152   BILITOT 0.67 01/17/2016 1356       Impression and Plan: [name] is a very pleasant 74yo caucasian female withpolycythemia, hemochromatosis (H63D heterozygous mutation)  with mitochondrial myopathy.She was diagnosed with extensive left lower extremity DVT and small right PE a month ago.  We drew a hypercoag panel on her to evaluate further for cause. Results are pending.  Hct is stable at 43.7% so no phlebotomy needed this visit.  Xarelto 20 mg PO daily ordered to start once she finishes her starter dose pack next week.  We will see her back in another 6 weeks.  She will contact our office with any questions or concerns. We can certainly see her sooner if needed.   Laverna Peace, NP 3/11/202111:39 AM

## 2019-04-07 ENCOUNTER — Telehealth: Payer: Self-pay | Admitting: Hematology & Oncology

## 2019-04-07 LAB — IRON AND TIBC
Iron: 63 ug/dL (ref 41–142)
Saturation Ratios: 20 % — ABNORMAL LOW (ref 21–57)
TIBC: 323 ug/dL (ref 236–444)
UIBC: 259 ug/dL (ref 120–384)

## 2019-04-07 LAB — FERRITIN: Ferritin: 47 ng/mL (ref 11–307)

## 2019-04-07 LAB — HOMOCYSTEINE: Homocysteine: 25.5 umol/L — ABNORMAL HIGH (ref 0.0–19.2)

## 2019-04-07 LAB — BETA-2-GLYCOPROTEIN I ABS, IGG/M/A
Beta-2 Glyco I IgG: <9 GPI IgG units (ref 0–20)
Beta-2-Glycoprotein I IgA: <9 GPI IgA units (ref 0–25)
Beta-2-Glycoprotein I IgM: <9 GPI IgM units (ref 0–32)

## 2019-04-07 LAB — PROTEIN S ACTIVITY: Protein S Activity: 167 % — ABNORMAL HIGH (ref 63–140)

## 2019-04-07 LAB — PROTEIN S, TOTAL: Protein S Ag, Total: 95 % (ref 60–150)

## 2019-04-07 LAB — CARDIOLIPIN ANTIBODIES, IGG, IGM, IGA
Anticardiolipin IgA: <9 APL U/mL (ref 0–11)
Anticardiolipin IgG: <9 GPL U/mL (ref 0–14)
Anticardiolipin IgM: <9 MPL U/mL (ref 0–12)

## 2019-04-07 LAB — PROTEIN C ACTIVITY: Protein C Activity: 133 % (ref 73–180)

## 2019-04-07 NOTE — Telephone Encounter (Signed)
Appointments scheduled calendar/letter mailed per 3/11 los

## 2019-04-08 LAB — LUPUS ANTICOAGULANT PANEL
DRVVT: 118.7 s — ABNORMAL HIGH (ref 0.0–47.0)
PTT Lupus Anticoagulant: 44.1 s (ref 0.0–51.9)

## 2019-04-08 LAB — DRVVT CONFIRM: dRVVT Confirm: 1.7 ratio — ABNORMAL HIGH (ref 0.8–1.2)

## 2019-04-08 LAB — DRVVT MIX: dRVVT Mix: 76.5 s — ABNORMAL HIGH (ref 0.0–40.4)

## 2019-04-08 LAB — PROTEIN C, TOTAL: Protein C, Total: 112 % (ref 60–150)

## 2019-04-10 LAB — FACTOR 5 LEIDEN

## 2019-04-11 LAB — PROTHROMBIN GENE MUTATION

## 2019-04-12 ENCOUNTER — Other Ambulatory Visit: Payer: Self-pay | Admitting: Family

## 2019-04-12 ENCOUNTER — Telehealth: Payer: Self-pay | Admitting: Family

## 2019-04-12 DIAGNOSIS — E7211 Homocystinuria: Secondary | ICD-10-CM

## 2019-04-12 MED ORDER — FOLIC ACID 1 MG PO TABS: 1.0000 mg | ORAL_TABLET | Freq: Every day | ORAL | 11 refills | Status: DC

## 2019-04-12 NOTE — Telephone Encounter (Signed)
I was able to speak with the patient and her husband about her recent lab work, specifically positive lupus anticoagulant and elevated homocystine level. We will get her started on folic acid 1 mg PO daily.  We will repeat these labs in another couple months to re-evaluate. No questions at this time.

## 2019-04-21 DIAGNOSIS — Z23 Encounter for immunization: Secondary | ICD-10-CM | POA: Diagnosis not present

## 2019-05-03 ENCOUNTER — Ambulatory Visit: Payer: Medicare Other | Admitting: Emergency Medicine

## 2019-05-11 DIAGNOSIS — D3132 Benign neoplasm of left choroid: Secondary | ICD-10-CM | POA: Diagnosis not present

## 2019-05-11 DIAGNOSIS — Z961 Presence of intraocular lens: Secondary | ICD-10-CM | POA: Diagnosis not present

## 2019-05-11 DIAGNOSIS — H43813 Vitreous degeneration, bilateral: Secondary | ICD-10-CM | POA: Diagnosis not present

## 2019-05-11 DIAGNOSIS — H0011 Chalazion right upper eyelid: Secondary | ICD-10-CM | POA: Diagnosis not present

## 2019-05-18 ENCOUNTER — Encounter: Payer: Self-pay | Admitting: Family

## 2019-05-18 ENCOUNTER — Inpatient Hospital Stay: Payer: Medicare Other

## 2019-05-18 ENCOUNTER — Other Ambulatory Visit: Payer: Self-pay

## 2019-05-18 ENCOUNTER — Inpatient Hospital Stay: Payer: Medicare Other | Attending: Hematology & Oncology

## 2019-05-18 ENCOUNTER — Inpatient Hospital Stay (HOSPITAL_BASED_OUTPATIENT_CLINIC_OR_DEPARTMENT_OTHER): Payer: Medicare Other | Admitting: Family

## 2019-05-18 VITALS — BP 117/52 | HR 63

## 2019-05-18 VITALS — BP 128/72 | HR 64 | Temp 98.1°F | Resp 18 | Ht 62.0 in | Wt 133.8 lb

## 2019-05-18 DIAGNOSIS — D508 Other iron deficiency anemias: Secondary | ICD-10-CM

## 2019-05-18 DIAGNOSIS — I82422 Acute embolism and thrombosis of left iliac vein: Secondary | ICD-10-CM

## 2019-05-18 DIAGNOSIS — I82402 Acute embolism and thrombosis of unspecified deep veins of left lower extremity: Secondary | ICD-10-CM | POA: Diagnosis not present

## 2019-05-18 DIAGNOSIS — Z7901 Long term (current) use of anticoagulants: Secondary | ICD-10-CM | POA: Diagnosis not present

## 2019-05-18 DIAGNOSIS — R760 Raised antibody titer: Secondary | ICD-10-CM

## 2019-05-18 DIAGNOSIS — D45 Polycythemia vera: Secondary | ICD-10-CM | POA: Diagnosis not present

## 2019-05-18 DIAGNOSIS — D6862 Lupus anticoagulant syndrome: Secondary | ICD-10-CM | POA: Diagnosis not present

## 2019-05-18 DIAGNOSIS — I2699 Other pulmonary embolism without acute cor pulmonale: Secondary | ICD-10-CM

## 2019-05-18 DIAGNOSIS — I82412 Acute embolism and thrombosis of left femoral vein: Secondary | ICD-10-CM

## 2019-05-18 DIAGNOSIS — E7211 Homocystinuria: Secondary | ICD-10-CM

## 2019-05-18 DIAGNOSIS — Z7982 Long term (current) use of aspirin: Secondary | ICD-10-CM | POA: Insufficient documentation

## 2019-05-18 LAB — CMP (CANCER CENTER ONLY)
ALT: 30 U/L (ref 0–44)
AST: 44 U/L — ABNORMAL HIGH (ref 15–41)
Albumin: 3.8 g/dL (ref 3.5–5.0)
Alkaline Phosphatase: 89 U/L (ref 38–126)
Anion gap: 7 (ref 5–15)
BUN: 9 mg/dL (ref 8–23)
CO2: 30 mmol/L (ref 22–32)
Calcium: 9.3 mg/dL (ref 8.9–10.3)
Chloride: 100 mmol/L (ref 98–111)
Creatinine: 0.71 mg/dL (ref 0.44–1.00)
GFR, Est AFR Am: >60 mL/min (ref >60)
GFR, Estimated: >60 mL/min (ref >60)
Glucose, Bld: 92 mg/dL (ref 70–99)
Potassium: 4.1 mmol/L (ref 3.5–5.1)
Sodium: 137 mmol/L (ref 135–145)
Total Bilirubin: 1.1 mg/dL (ref 0.3–1.2)
Total Protein: 7.3 g/dL (ref 6.5–8.1)

## 2019-05-18 LAB — CBC WITH DIFFERENTIAL (CANCER CENTER ONLY)
Abs Immature Granulocytes: 0.05 10*3/uL (ref 0.00–0.07)
Basophils Absolute: 0 10*3/uL (ref 0.0–0.1)
Basophils Relative: 0 %
Eosinophils Absolute: 0.1 10*3/uL (ref 0.0–0.5)
Eosinophils Relative: 2 %
HCT: 47 % — ABNORMAL HIGH (ref 36.0–46.0)
Hemoglobin: 15.2 g/dL — ABNORMAL HIGH (ref 12.0–15.0)
Immature Granulocytes: 1 %
Lymphocytes Relative: 12 %
Lymphs Abs: 0.9 10*3/uL (ref 0.7–4.0)
MCH: 32.7 pg (ref 26.0–34.0)
MCHC: 32.3 g/dL (ref 30.0–36.0)
MCV: 101.1 fL — ABNORMAL HIGH (ref 80.0–100.0)
Monocytes Absolute: 0.9 10*3/uL (ref 0.1–1.0)
Monocytes Relative: 12 %
Neutro Abs: 6 10*3/uL (ref 1.7–7.7)
Neutrophils Relative %: 73 %
Platelet Count: 150 10*3/uL (ref 150–400)
RBC: 4.65 MIL/uL (ref 3.87–5.11)
RDW: 12.9 % (ref 11.5–15.5)
WBC Count: 8 10*3/uL (ref 4.0–10.5)
nRBC: 0 % (ref 0.0–0.2)

## 2019-05-18 MED ORDER — SODIUM CHLORIDE 0.9 % IV SOLN
INTRAVENOUS | Status: DC
  Filled 2019-05-18: qty 250

## 2019-05-18 NOTE — Progress Notes (Signed)
Hematology and Oncology Follow Up Visit  [name] [id number] 12/10/1945 74 y.o. 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 Daily  IV iron as indicated Zometa IV every 6 months- due again inJuly 2021   Interim History:  [name] is here today for follow-up. She is feeling fatigued and unfortunately has styes on both top eye lids. She has been doing warm compresses and states that she will follow-up with her PCP if she does not note improvement.  She has had no issue with bleeding. No bruising or petechiae.  She has had occasional night sweats.   She has some SOB with exacerbation of asthma due to the weather and pollen.  No fever, chills, n/v, cough, rash, dizziness, chest pain, palpitations, abdominal pain or changes in bowel or bladder habits.  No swelling, tenderness, numbness or tingling in her extremities at this time.  No falls or syncopal episodes reported.  She states that her appetite is good and she is staying well hydrated. Her weight is stable at 133 lbs.   ECOG Performance Status: 1 - Symptomatic but completely ambulatory  Medications:  Allergies as of 05/18/2019      Reactions   Dulera [mometasone Furo-formoterol Fum] Cough      Medication List       Accurate as of May 18, 2019 12:38 PM. If you have any questions, ask your nurse or doctor.        albuterol 108 (90 Base) MCG/ACT inhaler Commonly known as: VENTOLIN HFA Inhale 2 puffs into the lungs every 4 (four) hours as needed for wheezing or shortness of breath.   budesonide-formoterol 80-4.5 MCG/ACT inhaler Commonly  known as: Symbicort Inhale 2 puffs into the lungs 2 (two) times daily.   dicyclomine 20 MG tablet Commonly known as: BENTYL Take 1 tablet by mouth as needed.   folic acid 1 MG tablet Commonly known as: FOLVITE Take 1 tablet (1 mg total) by mouth daily.   LORazepam 0.5 MG tablet Commonly known as: ATIVAN TAKE 1 TABLET BY MOUTH EVERY NIGHT AT BEDTIME   metroNIDAZOLE 0.75 % cream Commonly known as: METROCREAM APPLY ON THE FACE BID AS NEEDED FOR FLARES   pantoprazole 40 MG tablet Commonly known as: PROTONIX take 1 tablet by mouth daily before meals   propranolol 20 MG tablet Commonly known as: INDERAL Take 20 mg by mouth daily.   rivaroxaban 20 MG Tabs tablet Commonly known as: XARELTO Take 1 tablet (20 mg total) by mouth daily with supper.   sertraline 100 MG tablet Commonly known as: ZOLOFT Take 100 mg by mouth daily.   Vitamin D (Ergocalciferol) 1.25 MG (50000 UNIT) Caps capsule Commonly known as: DRISDOL Take 1 capsule (50,000 Units total) by mouth every 7 (seven) days. Sundays       Allergies:  Allergies  Allergen Reactions  . Dulera [Mometasone Furo-Formoterol Fum] Cough    Past Medical History, Surgical history, Social history, and Family History were reviewed and updated.  Review of Systems: All other 10 point review of systems is negative.   Physical Exam:  height is 5\' 2"  (1.575 m) and weight is 133 lb 12.8  oz (60.7 kg). Her temporal temperature is 98.1 F (36.7 C). Her blood pressure is 128/72 and her pulse is 64. Her respiration is 18 and oxygen saturation is 98%.   Wt Readings from Last 3 Encounters:  05/18/19 133 lb 12.8 oz (60.7 kg)  04/06/19 135 lb (61.2 kg)  03/17/19 135 lb (61.2 kg)    Ocular: Sclerae unicteric, pupils equal, round and reactive to light Ear-nose-throat: Oropharynx clear, dentition fair Lymphatic: No cervical or supraclavicular adenopathy Lungs no rales or rhonchi, good excursion bilaterally Heart regular rate and  rhythm, no murmur appreciated Abd soft, nontender, positive bowel sounds, no liver or spleen tip palpated on exam, no fluid wave  MSK no focal spinal tenderness, no joint edema Neuro: non-focal, well-oriented, appropriate affect Breasts: Deferred   Lab Results  Component Value Date   WBC 8.0 05/18/2019   HGB 15.2 (H) 05/18/2019   HCT 47.0 (H) 05/18/2019   MCV 101.1 (H) 05/18/2019   PLT 150 05/18/2019   Lab Results  Component Value Date   FERRITIN 47 04/06/2019   IRON 63 04/06/2019   TIBC 323 04/06/2019   UIBC 259 04/06/2019   IRONPCTSAT 20 (L) 04/06/2019   Lab Results  Component Value Date   RETICCTPCT 1.5 06/12/2014   RBC 4.65 05/18/2019   RETICCTABS 65.9 06/12/2014   Lab Results  Component Value Date   KAPLAMBRATIO 0.91 04/27/2016   Lab Results  Component Value Date   IGGSERUM 897 04/27/2016   IGA 339 02/27/2014   IGMSERUM 132 04/27/2016   Lab Results  Component Value Date   MSPIKE Not Observed 04/27/2016     Chemistry      Component Value Date/Time   NA 137 05/18/2019 1110   NA 144 01/20/2017 1344   NA 138 01/17/2016 1356   K 4.1 05/18/2019 1110   K 3.6 01/20/2017 1344   K 4.1 01/17/2016 1356   CL 100 05/18/2019 1110   CL 107 01/20/2017 1344   CO2 30 05/18/2019 1110   CO2 25 01/20/2017 1344   CO2 25 01/17/2016 1356   BUN 9 05/18/2019 1110   BUN 12 01/20/2017 1344   BUN 12.3 01/17/2016 1356   CREATININE 0.71 05/18/2019 1110   CREATININE 0.7 01/20/2017 1344   CREATININE 0.8 01/17/2016 1356      Component Value Date/Time   CALCIUM 9.3 05/18/2019 1110   CALCIUM 8.9 01/20/2017 1344   CALCIUM 9.1 01/17/2016 1356   ALKPHOS 89 05/18/2019 1110   ALKPHOS 68 01/20/2017 1344   ALKPHOS 87 01/17/2016 1356   AST 44 (H) 05/18/2019 1110   AST 48 (H) 01/17/2016 1356   ALT 30 05/18/2019 1110   ALT 49 (H) 01/20/2017 1344   ALT 38 01/17/2016 1356   BILITOT 1.1 05/18/2019 1110   BILITOT 0.67 01/17/2016 1356       Impression and Plan: [name] is a very  pleasant 74yo caucasian female withpolycythemia, 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She was diagnosed with extensive left lower extremity DVT and small right PE in February 2021. She had mildly elevated homocystine level and positive lupus anticagulant.  She was phlebotomized today for Hct if 47% and replacement fluids given.  We will repeat her CT angio and US of the left lower extremity when she comes back in for follow-up.  We will see her back in another 6 weeks.  She will contact our office with any questions or concerns. We can certainly see her sooner if needed.   Laverna Peace, NP 4/22/202112:38  PM

## 2019-05-18 NOTE — Patient Instructions (Signed)

## 2019-05-19 ENCOUNTER — Telehealth: Payer: Self-pay | Admitting: Hematology & Oncology

## 2019-05-19 DIAGNOSIS — I2699 Other pulmonary embolism without acute cor pulmonale: Secondary | ICD-10-CM | POA: Diagnosis not present

## 2019-05-19 DIAGNOSIS — H00024 Hordeolum internum left upper eyelid: Secondary | ICD-10-CM | POA: Diagnosis not present

## 2019-05-19 DIAGNOSIS — H00022 Hordeolum internum right lower eyelid: Secondary | ICD-10-CM | POA: Diagnosis not present

## 2019-05-19 DIAGNOSIS — I82402 Acute embolism and thrombosis of unspecified deep veins of left lower extremity: Secondary | ICD-10-CM | POA: Diagnosis not present

## 2019-05-19 DIAGNOSIS — H05012 Cellulitis of left orbit: Secondary | ICD-10-CM | POA: Diagnosis not present

## 2019-05-19 LAB — FERRITIN: Ferritin: 34 ng/mL (ref 11–307)

## 2019-05-19 LAB — IRON AND TIBC
Iron: 100 ug/dL (ref 41–142)
Saturation Ratios: 28 % (ref 21–57)
TIBC: 360 ug/dL (ref 236–444)
UIBC: 260 ug/dL (ref 120–384)

## 2019-05-19 NOTE — Telephone Encounter (Signed)
Appointments scheduled calendar mailed per 4/22 los

## 2019-05-24 ENCOUNTER — Encounter: Payer: Self-pay | Admitting: Emergency Medicine

## 2019-05-24 ENCOUNTER — Other Ambulatory Visit: Payer: Self-pay

## 2019-05-24 ENCOUNTER — Ambulatory Visit (INDEPENDENT_AMBULATORY_CARE_PROVIDER_SITE_OTHER): Payer: Medicare Other | Admitting: Emergency Medicine

## 2019-05-24 DIAGNOSIS — J449 Chronic obstructive pulmonary disease, unspecified: Secondary | ICD-10-CM

## 2019-05-24 DIAGNOSIS — I2699 Other pulmonary embolism without acute cor pulmonale: Secondary | ICD-10-CM | POA: Diagnosis not present

## 2019-05-24 DIAGNOSIS — Z86711 Personal history of pulmonary embolism: Secondary | ICD-10-CM | POA: Insufficient documentation

## 2019-05-24 NOTE — Assessment & Plan Note (Signed)
I discussed with her today possibly changing her Symbicort to an alternative that also includes a LAMA.  She is concerned about side effects, wants to stay on the Symbicort for now.  We could revisit later depending on how her dyspnea progressive.  I think would also be reasonable to perform exertional oximetry after she has been treated for her thromboembolic disease.  She may qualify for long-term exertional oxygen  We will continue Symbicort 2 puffs twice a day. Rinse and gargle after using Keep albuterol available to use 2 puffs up to every 4 hours if needed for shortness of breath, chest tightness, wheezing.   COVID vaccine up to date.  Follow with Dr Lamonte Sakai in 3 months or sooner if you have any problems.

## 2019-05-24 NOTE — Patient Instructions (Addendum)
Please continue your Xarelto daily. Follow with Dr Katheran Awe as planned. We will decide together how long to continue this medication.  We will continue Symbicort 2 puffs twice a day. Rinse and gargle after using Keep albuterol available to use 2 puffs up to every 4 hours if needed for shortness of breath, chest tightness, wheezing.   COVID vaccine up to date.  Follow with Dr Lamonte Sakai in 3 months or sooner if you have any problems.

## 2019-05-24 NOTE — Assessment & Plan Note (Signed)
Based on her history of polycythemia vera she may need to stay on long-term anticoagulation, possibly prophylactic dose Eliquis.  She is following with Dr. Marin Olp and suspect there will be some hypercoagulability work-up.  Plan to coordinate with Dr. Marin Olp when she has been adequately treated for acute clot.

## 2019-05-24 NOTE — Progress Notes (Signed)
Subjective:    Patient ID: [name], female    DOB: 23-Aug-1945, 74 y.o.   MRN: [id number]  HPI  ROV 07/22/16 -- patient has a history of severe obstructive lung disease with a bronchodilator response on spirometry. We have been managing her with Symbicort since January, using 1 puffs bid instead of 2. She was dealing with severe cough through the Spring. Then she had thoracic compression fractures so her exercise has been minimal. Difficult to gage her exercise tolerance right now. She is concerned about the steroid dose, interested in decreasing to 80/4.5. Rarely needs albuterol. No flares.   ROV 05/24/19 --[name] is 66 and has a history of severe obstructive lung disease with a bronchodilator response on spirometry.  She also has chronic cough with contributions from allergic rhinitis and also GERD.  She has a history of polycythemia vera and hemachromatosis and underwent lower extremity Doppler studies in February 2020 for bilateral lower extremity edema, found to have B DVT.  Subsequent CT chest 03/17/2019 reviewed by me, shows multiple bilateral subsegmental pulmonary emboli.  She is on Xarelto since the dx. Following with Dr Monika Salk.  She remains on Symbicort. Still has exertional dyspnea, better at rest. Had difficulty with walking through this office. She uses albuterol 0-2x a day, more lately due to the allergy season. She has daily cough. Minimal breakthrough reflux sx.    Review of Systems  Constitutional: Negative for fever and unexpected weight change.  HENT: Positive for congestion, postnasal drip and sinus pressure. Negative for dental problem, ear pain, nosebleeds, rhinorrhea, sneezing, sore throat and trouble swallowing.   Eyes: Negative for redness and itching.  Respiratory: Positive for cough, chest tightness and shortness of breath. Negative for wheezing.   Cardiovascular: Negative for palpitations and leg swelling.  Gastrointestinal: Negative for nausea and  vomiting.  Genitourinary: Negative for dysuria.  Musculoskeletal: Negative for joint swelling.  Skin: Negative for rash.  Neurological: Negative for headaches.  Hematological: Does not bruise/bleed easily.  Psychiatric/Behavioral: Negative for dysphoric mood. The patient is not nervous/anxious.     Past Medical History:  Diagnosis Date  . Allergy   . Anemia   . Anxiety   . Asthma    DR. Darica Goren  . Blood transfusion without reported diagnosis   . Cataract   . Cholelithiasis   . Chronic kidney disease    gallstones   . Compression fracture of body of thoracic vertebra (HCC)   . Depression   . Dyspnea   . Dysrhythmia    palpitations  . GERD (gastroesophageal reflux disease)   . Heart attack (HCC)   . Hemorrhoids   . IBS (irritable bowel syndrome)   . Iliotibial band syndrome    left knee  . Migraine   . Mitochondrial myopathy   . Normal coronary arteries    by cardiac catheterization performed by myself 02/25/01  . Osteoporosis   . Pericarditis    age 38  . Pneumothorax   . 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 PVC's (premature ventricular contractions)   . Vitamin D deficiency      Family History  Problem Relation Age of Onset  . Asthma Mother   . Arthritis Mother   . Depression Mother   . Emphysema Father   . Stroke Father   . Alcohol abuse Father   . Heart attack Father   . Lung cancer Father   . Colon cancer Neg Hx   . Esophageal cancer Neg Hx   . Stomach cancer  Neg Hx   . Pancreatic cancer Neg Hx      Social History   Socioeconomic History  . Marital status: Married    Spouse name: Richard  . Number of children: 2  . Years of education: Grad  . Highest education level: Not on file  Occupational History  . Occupation: Patent examiner: RETIRED  Tobacco Use  . Smoking status: Never Smoker  . Smokeless tobacco: Never Used  . Tobacco comment: never used tobacco  Substance and Sexual Activity  . Alcohol use: Yes    Alcohol/week:  2.0 standard drinks    Types: 2 Glasses of wine per week    Comment: occasionally  . Drug use: No  . Sexual activity: Not on file  Other Topics Concern  . Not on file  Social History Narrative   Health Care POA:    Emergency Contact: husband, Truman Hayward, (c) [phone]   End of Life Plan:    Who lives with you: husband   Any pets: none   Diet: Pt has a varied diet.  Eats 5 sm. meals throughout day, focuses on protein, doesn't care for fruits and vegetables very much.   Exercise: Pt has a personal training and exercises several times a week.   Seatbelts: Pt reports wearing seatbelt when in vehicle.   [name] Exposure/Protection: Pt reports wearing sun protection.    Hobbies: reading, visiting with friends   Patient has a Event organiser.   Patient has two children.   Patient is retired.   Patient does not drink any caffeine.   Patient is right handed.         Social Determinants of Health   Financial Resource Strain:   . Difficulty of Paying Living Expenses:   Food Insecurity:   . Worried About Programme researcher, broadcasting/film/video in the Last Year:   . Barista in the Last Year:   Transportation Needs:   . Freight forwarder (Medical):   Marland Kitchen Lack of Transportation (Non-Medical):   Physical Activity:   . Days of Exercise per Week:   . Minutes of Exercise per Session:   Stress:   . Feeling of Stress :   Social Connections:   . Frequency of Communication with Friends and Family:   . Frequency of Social Gatherings with Friends and Family:   . Attends Religious Services:   . Active Member of Clubs or Organizations:   . Attends Banker Meetings:   Marland Kitchen Marital Status:   Intimate Partner Violence:   . Fear of Current or Ex-Partner:   . Emotionally Abused:   Marland Kitchen Physically Abused:   . Sexually Abused:      Allergies  Allergen Reactions  . Dulera [Mometasone Furo-Formoterol Fum] Cough     Outpatient Medications Prior to Visit  Medication Sig Dispense Refill  .  albuterol (VENTOLIN HFA) 108 (90 Base) MCG/ACT inhaler Inhale 2 puffs into the lungs every 4 (four) hours as needed for wheezing or shortness of breath. 8 g 2  . budesonide-formoterol (SYMBICORT) 80-4.5 MCG/ACT inhaler Inhale 2 puffs into the lungs 2 (two) times daily. 10.2 g 2  . dicyclomine (BENTYL) 20 MG tablet Take 1 tablet by mouth as needed.    . folic acid (FOLVITE) 1 MG tablet Take 1 tablet (1 mg total) by mouth daily. 30 tablet 11  . LORazepam (ATIVAN) 0.5 MG tablet TAKE 1 TABLET BY MOUTH EVERY NIGHT AT BEDTIME 30 tablet 0  . metroNIDAZOLE (METROCREAM)  0.75 % cream APPLY ON THE FACE BID AS NEEDED FOR FLARES    . pantoprazole (PROTONIX) 40 MG tablet take 1 tablet by mouth daily before meals    . propranolol (INDERAL) 20 MG tablet Take 20 mg by mouth daily.    . rivaroxaban (XARELTO) 20 MG TABS tablet Take 1 tablet (20 mg total) by mouth daily with supper. 30 tablet 4  . sertraline (ZOLOFT) 100 MG tablet Take 100 mg by mouth daily.  0  . Vitamin D, Ergocalciferol, (DRISDOL) 50000 units CAPS capsule Take 1 capsule (50,000 Units total) by mouth every 7 (seven) days. Sundays 30 capsule 3   No facility-administered medications prior to visit.         Objective:   Physical Exam Vitals:   05/24/19 1129  BP: (!) 118/58  Pulse: 71  Temp: 98.2 F (36.8 C)  TempSrc: Temporal  SpO2: 96%  Weight: 134 lb 3.2 oz (60.9 kg)  Height: 5\' 3"  (1.6 m)   Gen: Pleasant, well-nourished, in no distress,  normal affect  ENT: No lesions,  mouth clear,  oropharynx clear, no postnasal drip  Neck: No JVD, no stridor  Lungs: No use of accessory muscles, clear without rales or rhonchi, no wheeze  Cardiovascular: RRR, heart sounds normal, no murmur or gallops, no peripheral edema  Musculoskeletal: No deformities, no cyanosis or clubbing  Neuro: alert, non focal  Skin: Warm, no lesions or rashes       Assessment & Plan:  COPD (chronic obstructive pulmonary disease) (HCC) I discussed with her  today possibly changing her Symbicort to an alternative that also includes a LAMA.  She is concerned about side effects, wants to stay on the Symbicort for now.  We could revisit later depending on how her dyspnea progressive.  I think would also be reasonable to perform exertional oximetry after she has been treated for her thromboembolic disease.  She may qualify for long-term exertional oxygen  We will continue Symbicort 2 puffs twice a day. Rinse and gargle after using Keep albuterol available to use 2 puffs up to every 4 hours if needed for shortness of breath, chest tightness, wheezing.   COVID vaccine up to date.  Follow with Dr Delton Coombes in 3 months or sooner if you have any problems.  Pulmonary embolism (HCC) Based on her history of polycythemia vera she may need to stay on long-term anticoagulation, possibly prophylactic dose Eliquis.  She is following with Dr. Myna Hidalgo and suspect there will be some hypercoagulability work-up.  Plan to coordinate with Dr. Myna Hidalgo when she has been adequately treated for acute clot.  Levy Pupa, MD, PhD 05/24/2019, 11:52 AM [facility] [phone] or if no answer [phone]

## 2019-05-25 DIAGNOSIS — H00024 Hordeolum internum left upper eyelid: Secondary | ICD-10-CM | POA: Diagnosis not present

## 2019-05-25 DIAGNOSIS — H00022 Hordeolum internum right lower eyelid: Secondary | ICD-10-CM | POA: Diagnosis not present

## 2019-05-25 DIAGNOSIS — R197 Diarrhea, unspecified: Secondary | ICD-10-CM | POA: Diagnosis not present

## 2019-05-25 DIAGNOSIS — H05012 Cellulitis of left orbit: Secondary | ICD-10-CM | POA: Diagnosis not present

## 2019-07-03 ENCOUNTER — Other Ambulatory Visit: Payer: Self-pay | Admitting: Emergency Medicine

## 2019-07-05 ENCOUNTER — Other Ambulatory Visit: Payer: Self-pay | Admitting: *Deleted

## 2019-07-05 DIAGNOSIS — D45 Polycythemia vera: Secondary | ICD-10-CM

## 2019-07-05 DIAGNOSIS — D508 Other iron deficiency anemias: Secondary | ICD-10-CM

## 2019-07-06 ENCOUNTER — Encounter (HOSPITAL_BASED_OUTPATIENT_CLINIC_OR_DEPARTMENT_OTHER): Payer: Self-pay

## 2019-07-06 ENCOUNTER — Inpatient Hospital Stay: Payer: Medicare Other

## 2019-07-06 ENCOUNTER — Other Ambulatory Visit: Payer: Self-pay

## 2019-07-06 ENCOUNTER — Ambulatory Visit (HOSPITAL_BASED_OUTPATIENT_CLINIC_OR_DEPARTMENT_OTHER)
Admission: RE | Admit: 2019-07-06 | Discharge: 2019-07-06 | Disposition: A | Discharge status: Home / Self Care | Payer: Medicare Other | Source: Ambulatory Visit | Attending: Family | Admitting: Family

## 2019-07-06 ENCOUNTER — Telehealth: Payer: Self-pay | Admitting: Hematology & Oncology

## 2019-07-06 ENCOUNTER — Inpatient Hospital Stay: Payer: Medicare Other | Attending: Hematology & Oncology

## 2019-07-06 ENCOUNTER — Encounter: Payer: Self-pay | Admitting: Hematology & Oncology

## 2019-07-06 ENCOUNTER — Inpatient Hospital Stay (HOSPITAL_BASED_OUTPATIENT_CLINIC_OR_DEPARTMENT_OTHER): Payer: Medicare Other | Admitting: Hematology & Oncology

## 2019-07-06 VITALS — BP 117/56 | HR 67 | Temp 98.0°F | Resp 18

## 2019-07-06 DIAGNOSIS — I82402 Acute embolism and thrombosis of unspecified deep veins of left lower extremity: Secondary | ICD-10-CM | POA: Diagnosis not present

## 2019-07-06 DIAGNOSIS — D45 Polycythemia vera: Secondary | ICD-10-CM | POA: Insufficient documentation

## 2019-07-06 DIAGNOSIS — D500 Iron deficiency anemia secondary to blood loss (chronic): Secondary | ICD-10-CM | POA: Diagnosis not present

## 2019-07-06 DIAGNOSIS — M810 Age-related osteoporosis without current pathological fracture: Secondary | ICD-10-CM | POA: Diagnosis not present

## 2019-07-06 DIAGNOSIS — G713 Mitochondrial myopathy, not elsewhere classified: Secondary | ICD-10-CM | POA: Insufficient documentation

## 2019-07-06 DIAGNOSIS — I82422 Acute embolism and thrombosis of left iliac vein: Secondary | ICD-10-CM | POA: Insufficient documentation

## 2019-07-06 DIAGNOSIS — Z7901 Long term (current) use of anticoagulants: Secondary | ICD-10-CM | POA: Insufficient documentation

## 2019-07-06 DIAGNOSIS — D508 Other iron deficiency anemias: Secondary | ICD-10-CM

## 2019-07-06 DIAGNOSIS — D510 Vitamin B12 deficiency anemia due to intrinsic factor deficiency: Secondary | ICD-10-CM

## 2019-07-06 DIAGNOSIS — I2699 Other pulmonary embolism without acute cor pulmonale: Secondary | ICD-10-CM

## 2019-07-06 DIAGNOSIS — Z86711 Personal history of pulmonary embolism: Secondary | ICD-10-CM | POA: Diagnosis not present

## 2019-07-06 DIAGNOSIS — Z7982 Long term (current) use of aspirin: Secondary | ICD-10-CM | POA: Diagnosis not present

## 2019-07-06 DIAGNOSIS — I2609 Other pulmonary embolism with acute cor pulmonale: Secondary | ICD-10-CM

## 2019-07-06 DIAGNOSIS — Z86718 Personal history of other venous thrombosis and embolism: Secondary | ICD-10-CM | POA: Diagnosis not present

## 2019-07-06 LAB — CBC WITH DIFFERENTIAL (CANCER CENTER ONLY)
Abs Immature Granulocytes: 0.04 10*3/uL (ref 0.00–0.07)
Basophils Absolute: 0 10*3/uL (ref 0.0–0.1)
Basophils Relative: 0 %
Eosinophils Absolute: 0.1 10*3/uL (ref 0.0–0.5)
Eosinophils Relative: 2 %
HCT: 41.9 % (ref 36.0–46.0)
Hemoglobin: 13.6 g/dL (ref 12.0–15.0)
Immature Granulocytes: 1 %
Lymphocytes Relative: 14 %
Lymphs Abs: 1 10*3/uL (ref 0.7–4.0)
MCH: 30.8 pg (ref 26.0–34.0)
MCHC: 32.5 g/dL (ref 30.0–36.0)
MCV: 95 fL (ref 80.0–100.0)
Monocytes Absolute: 0.7 10*3/uL (ref 0.1–1.0)
Monocytes Relative: 10 %
Neutro Abs: 5.3 10*3/uL (ref 1.7–7.7)
Neutrophils Relative %: 73 %
Platelet Count: 151 10*3/uL (ref 150–400)
RBC: 4.41 MIL/uL (ref 3.87–5.11)
RDW: 13.1 % (ref 11.5–15.5)
WBC Count: 7.2 10*3/uL (ref 4.0–10.5)
nRBC: 0 % (ref 0.0–0.2)

## 2019-07-06 LAB — CMP (CANCER CENTER ONLY)
ALT: 21 U/L (ref 0–44)
AST: 32 U/L (ref 15–41)
Albumin: 3.8 g/dL (ref 3.5–5.0)
Alkaline Phosphatase: 71 U/L (ref 38–126)
Anion gap: 9 (ref 5–15)
BUN: 10 mg/dL (ref 8–23)
CO2: 26 mmol/L (ref 22–32)
Calcium: 9.5 mg/dL (ref 8.9–10.3)
Chloride: 102 mmol/L (ref 98–111)
Creatinine: 0.62 mg/dL (ref 0.44–1.00)
GFR, Est AFR Am: >60 mL/min (ref >60)
GFR, Estimated: >60 mL/min (ref >60)
Glucose, Bld: 127 mg/dL — ABNORMAL HIGH (ref 70–99)
Potassium: 3.9 mmol/L (ref 3.5–5.1)
Sodium: 137 mmol/L (ref 135–145)
Total Bilirubin: 0.7 mg/dL (ref 0.3–1.2)
Total Protein: 6.9 g/dL (ref 6.5–8.1)

## 2019-07-06 MED ORDER — IOHEXOL 350 MG/ML SOLN
100.0000 mL | Freq: Once | INTRAVENOUS | Status: AC | PRN
  Administered 2019-07-06: 100 mL via INTRAVENOUS

## 2019-07-06 NOTE — Telephone Encounter (Signed)
Appointments scheduled calendar printed per 6/110 los

## 2019-07-06 NOTE — Progress Notes (Signed)
Hematology and Oncology Follow Up Visit  [name] [id number] Mar 29, 1945 74 y.o. 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 Daily  IV iron as indicated Zometa IV every 6 months- due again inJanuary 2022   Interim History:  [name] is here today for follow-up.  Overall, she really is about the same.  She was has this chronic fatigue.  I will know if this is this mitochondrial myopathy that she has.  Since I last saw her, she has developed a thrombophilic condition.  She had a extensive thrombus in her left leg.  This is back in February.  She also had a small pulmonary embolism.  She has had an elevated homocysteine level.  She also was positive for the lupus anticoagulant.  She was started on folic acid.  She is on Xarelto right now.  She is on low-dose aspirin.  Clearly, she has thick blood.  I am sure the polycythemia has a role in this.  We did go ahead and do a CT angiogram of her chest today.  This did not show any residual pulmonary embolism.  We also did a Doppler of her left leg.  This did not show any residual thrombus in the left leg.  She has had no issues with her breathing.  She has had no fever.  She has had a coronavirus vaccines.  Overall, I would have to say that her performance status is ECOG 2.   Medications:  Allergies as of 07/06/2019      Reactions   Dulera [mometasone Furo-formoterol Fum] Cough      Medication List       Accurate as of July 06, 2019 12:41 PM. If you have any questions, ask your nurse or doctor.          albuterol 108 (90 Base) MCG/ACT inhaler Commonly known as: VENTOLIN HFA Inhale 2 puffs into the lungs every 4 (four) hours as needed for wheezing or shortness of breath.   dicyclomine 20 MG tablet Commonly known as: BENTYL Take 1 tablet by mouth as needed.   erythromycin ophthalmic ointment APPLY RIBBON TO THE AFFECTED AREA FOUR TIMES DAILY FOR 7 DAYS   folic acid 1 MG tablet Commonly known as: FOLVITE Take 1 tablet (1 mg total) by mouth daily.   LORazepam 0.5 MG tablet Commonly known as: ATIVAN TAKE 1 TABLET BY MOUTH EVERY NIGHT AT BEDTIME   metroNIDAZOLE 0.75 % cream Commonly known as: METROCREAM APPLY ON THE FACE BID AS NEEDED FOR FLARES   pantoprazole 40 MG tablet Commonly known as: PROTONIX take 1 tablet by mouth daily before meals   propranolol 20 MG tablet Commonly known as: INDERAL Take 20 mg by mouth daily.   rivaroxaban 20 MG Tabs tablet Commonly known as: XARELTO Take 1 tablet (20 mg total) by mouth daily with supper.   sertraline 100 MG tablet Commonly known as: ZOLOFT Take 100 mg by mouth daily.   Symbicort 80-4.5 MCG/ACT inhaler Generic drug: budesonide-formoterol INHALE 2 PUFFS INTO THE LUNGS TWICE DAILY   Vitamin D (Ergocalciferol) 1.25 MG (50000 UNIT) Caps capsule Commonly  known as: DRISDOL Take 1 capsule (50,000 Units total) by mouth every 7 (seven) days. Sundays       Allergies:  Allergies  Allergen Reactions  . Dulera [Mometasone Furo-Formoterol Fum] Cough    Past Medical History, Surgical history, Social history, and Family History were reviewed and updated.  Review of Systems: Review of Systems  Constitutional: Positive for malaise/fatigue.  HENT: Negative.   Eyes: Negative.   Respiratory: Positive for shortness of breath.   Cardiovascular: Negative.   Gastrointestinal: Positive for diarrhea.  Genitourinary: Negative.   Musculoskeletal: Positive for myalgias.  Skin: Negative.   Neurological: Positive for weakness.   Endo/Heme/Allergies: Bruises/bleeds easily.  Psychiatric/Behavioral: Negative.      Physical Exam:  oral temperature is 98 F (36.7 C). Her blood pressure is 117/56 (abnormal) and her pulse is 67. Her respiration is 18 and oxygen saturation is 99%.   Wt Readings from Last 3 Encounters:  05/24/19 134 lb 3.2 oz (60.9 kg)  05/18/19 133 lb 12.8 oz (60.7 kg)  04/06/19 135 lb (61.2 kg)    Physical Exam Vitals reviewed.  HENT:     Head: Normocephalic and atraumatic.  Eyes:     Pupils: Pupils are equal, round, and reactive to light.  Cardiovascular:     Rate and Rhythm: Normal rate and regular rhythm.     Heart sounds: Normal heart sounds.  Pulmonary:     Effort: Pulmonary effort is normal.     Breath sounds: Normal breath sounds.  Abdominal:     General: Bowel sounds are normal.     Palpations: Abdomen is soft.  Musculoskeletal:        General: No tenderness or deformity. Normal range of motion.     Cervical back: Normal range of motion.  Lymphadenopathy:     Cervical: No cervical adenopathy.  Skin:    General: Skin is warm and dry.     Findings: No erythema or rash.  Neurological:     Mental Status: She is alert and oriented to person, place, and time.  Psychiatric:        Behavior: Behavior normal.        Thought Content: Thought content normal.        Judgment: Judgment normal.      Lab Results  Component Value Date   WBC 7.2 07/06/2019   HGB 13.6 07/06/2019   HCT 41.9 07/06/2019   MCV 95.0 07/06/2019   PLT 151 07/06/2019   Lab Results  Component Value Date   FERRITIN 34 05/18/2019   IRON 100 05/18/2019   TIBC 360 05/18/2019   UIBC 260 05/18/2019   IRONPCTSAT 28 05/18/2019   Lab Results  Component Value Date   RETICCTPCT 1.5 06/12/2014   RBC 4.41 07/06/2019   RETICCTABS 65.9 06/12/2014   Lab Results  Component Value Date   KAPLAMBRATIO 0.91 04/27/2016   Lab Results  Component Value Date   IGGSERUM 897 04/27/2016   IGA 339 02/27/2014    IGMSERUM 132 04/27/2016   Lab Results  Component Value Date   MSPIKE Not Observed 04/27/2016     Chemistry      Component Value Date/Time   NA 137 07/06/2019 1033   NA 144 01/20/2017 1344   NA 138 01/17/2016 1356   K 3.9 07/06/2019 1033   K 3.6 01/20/2017 1344   K 4.1 01/17/2016 1356   CL 102 07/06/2019 1033   CL 107 01/20/2017 1344   CO2 26 07/06/2019 1033   CO2 25 01/20/2017 1344  CO2 25 01/17/2016 1356   BUN 10 07/06/2019 1033   BUN 12 01/20/2017 1344   BUN 12.3 01/17/2016 1356   CREATININE 0.62 07/06/2019 1033   CREATININE 0.7 01/20/2017 1344   CREATININE 0.8 01/17/2016 1356      Component Value Date/Time   CALCIUM 9.5 07/06/2019 1033   CALCIUM 8.9 01/20/2017 1344   CALCIUM 9.1 01/17/2016 1356   ALKPHOS 71 07/06/2019 1033   ALKPHOS 68 01/20/2017 1344   ALKPHOS 87 01/17/2016 1356   AST 32 07/06/2019 1033   AST 48 (H) 01/17/2016 1356   ALT 21 07/06/2019 1033   ALT 49 (H) 01/20/2017 1344   ALT 38 01/17/2016 1356   BILITOT 0.7 07/06/2019 1033   BILITOT 0.67 01/17/2016 1356       Impression and Plan: Ms. 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  She was diagnosed with extensive left lower extremity DVT and small right PE in February 2021.She had mildly elevated homocystine level and positive lupus anticagulant.  We will have to repeat these.  She will need to be on therapeutic Xarelto for a year at least.  The question is whether or not she needs lifelong anticoagulation.  We will have to see what her lupus anticoagulant shows.  She does not need to be phlebotomized.  I really doubt that the hemochromatosis will ever be an issue for [place].  She will get her Zometa in July.  We will see her back ourselves right after Labor Day.  I think this is reasonable and will help her.   [name], MD 6/10/202112:41 PM

## 2019-07-07 LAB — IRON AND TIBC
Iron: 41 ug/dL (ref 41–142)
Saturation Ratios: 11 % — ABNORMAL LOW (ref 21–57)
TIBC: 383 ug/dL (ref 236–444)
UIBC: 342 ug/dL (ref 120–384)

## 2019-07-07 LAB — FERRITIN: Ferritin: 21 ng/mL (ref 11–307)

## 2019-07-10 ENCOUNTER — Telehealth: Payer: Self-pay | Admitting: *Deleted

## 2019-07-10 ENCOUNTER — Encounter: Payer: Self-pay | Admitting: *Deleted

## 2019-07-10 NOTE — Telephone Encounter (Signed)
-----   Message from Eliezer Bottom, NP sent at 07/10/2019 12:04 PM EDT ----- DVT and PE have resolved!!! The medication is working!!! Continue therapy. WOO HOO!!!!!!!!!  ----- Message ----- From: Interface, Rad Results In Sent: 07/06/2019  11:05 AM EDT To: Eliezer Bottom, NP

## 2019-08-03 ENCOUNTER — Other Ambulatory Visit: Payer: Self-pay | Admitting: *Deleted

## 2019-08-03 DIAGNOSIS — D45 Polycythemia vera: Secondary | ICD-10-CM

## 2019-08-04 ENCOUNTER — Inpatient Hospital Stay: Payer: Medicare Other

## 2019-08-07 ENCOUNTER — Inpatient Hospital Stay: Payer: Medicare Other

## 2019-08-07 ENCOUNTER — Inpatient Hospital Stay: Payer: Medicare Other | Attending: Hematology & Oncology

## 2019-08-07 ENCOUNTER — Other Ambulatory Visit: Payer: Self-pay

## 2019-08-07 VITALS — BP 140/60 | HR 96 | Temp 98.0°F | Resp 18

## 2019-08-07 DIAGNOSIS — D508 Other iron deficiency anemias: Secondary | ICD-10-CM

## 2019-08-07 DIAGNOSIS — Z79899 Other long term (current) drug therapy: Secondary | ICD-10-CM | POA: Insufficient documentation

## 2019-08-07 DIAGNOSIS — M8008XG Age-related osteoporosis with current pathological fracture, vertebra(e), subsequent encounter for fracture with delayed healing: Secondary | ICD-10-CM

## 2019-08-07 DIAGNOSIS — M810 Age-related osteoporosis without current pathological fracture: Secondary | ICD-10-CM | POA: Insufficient documentation

## 2019-08-07 DIAGNOSIS — E8849 Other mitochondrial metabolism disorders: Secondary | ICD-10-CM | POA: Insufficient documentation

## 2019-08-07 DIAGNOSIS — D45 Polycythemia vera: Secondary | ICD-10-CM | POA: Diagnosis not present

## 2019-08-07 DIAGNOSIS — I82502 Chronic embolism and thrombosis of unspecified deep veins of left lower extremity: Secondary | ICD-10-CM | POA: Diagnosis not present

## 2019-08-07 LAB — CBC WITH DIFFERENTIAL (CANCER CENTER ONLY)
Abs Immature Granulocytes: 0.03 10*3/uL (ref 0.00–0.07)
Basophils Absolute: 0 10*3/uL (ref 0.0–0.1)
Basophils Relative: 1 %
Eosinophils Absolute: 0.1 10*3/uL (ref 0.0–0.5)
Eosinophils Relative: 1 %
HCT: 42 % (ref 36.0–46.0)
Hemoglobin: 13.6 g/dL (ref 12.0–15.0)
Immature Granulocytes: 0 %
Lymphocytes Relative: 13 %
Lymphs Abs: 0.9 10*3/uL (ref 0.7–4.0)
MCH: 30.5 pg (ref 26.0–34.0)
MCHC: 32.4 g/dL (ref 30.0–36.0)
MCV: 94.2 fL (ref 80.0–100.0)
Monocytes Absolute: 0.7 10*3/uL (ref 0.1–1.0)
Monocytes Relative: 10 %
Neutro Abs: 5.4 10*3/uL (ref 1.7–7.7)
Neutrophils Relative %: 75 %
Platelet Count: 137 10*3/uL — ABNORMAL LOW (ref 150–400)
RBC: 4.46 MIL/uL (ref 3.87–5.11)
RDW: 14.5 % (ref 11.5–15.5)
WBC Count: 7.2 10*3/uL (ref 4.0–10.5)
nRBC: 0 % (ref 0.0–0.2)

## 2019-08-07 LAB — CMP (CANCER CENTER ONLY)
ALT: 25 U/L (ref 0–44)
AST: 38 U/L (ref 15–41)
Albumin: 3.9 g/dL (ref 3.5–5.0)
Alkaline Phosphatase: 69 U/L (ref 38–126)
Anion gap: 10 (ref 5–15)
BUN: 14 mg/dL (ref 8–23)
CO2: 27 mmol/L (ref 22–32)
Calcium: 9.1 mg/dL (ref 8.9–10.3)
Chloride: 99 mmol/L (ref 98–111)
Creatinine: 0.67 mg/dL (ref 0.44–1.00)
GFR, Est AFR Am: >60 mL/min (ref >60)
GFR, Estimated: >60 mL/min (ref >60)
Glucose, Bld: 120 mg/dL — ABNORMAL HIGH (ref 70–99)
Potassium: 3.8 mmol/L (ref 3.5–5.1)
Sodium: 136 mmol/L (ref 135–145)
Total Bilirubin: 0.9 mg/dL (ref 0.3–1.2)
Total Protein: 6.8 g/dL (ref 6.5–8.1)

## 2019-08-07 MED ORDER — SODIUM CHLORIDE 0.9 % IV SOLN
INTRAVENOUS | Status: DC
  Filled 2019-08-07 (×2): qty 250

## 2019-08-07 MED ORDER — ZOLEDRONIC ACID 4 MG/100ML IV SOLN
4.0000 mg | Freq: Once | INTRAVENOUS | Status: AC
  Administered 2019-08-07: 4 mg via INTRAVENOUS
  Filled 2019-08-07: qty 100

## 2019-08-07 NOTE — Patient Instructions (Signed)
Zoledronic Acid injection (Hypercalcemia, Oncology) What is this medicine? ZOLEDRONIC ACID (ZOE le dron ik AS id) lowers the amount of calcium loss from bone. It is used to treat too much calcium in your blood from cancer. It is also used to prevent complications of cancer that has spread to the bone. This medicine may be used for other purposes; ask your health care provider or pharmacist if you have questions. COMMON BRAND NAME(S): Zometa What should I tell my health care provider before I take this medicine? They need to know if you have any of these conditions:  aspirin-sensitive asthma  cancer, especially if you are receiving medicines used to treat cancer  dental disease or wear dentures  infection  kidney disease  receiving corticosteroids like dexamethasone or prednisone  an unusual or allergic reaction to zoledronic acid, other medicines, foods, dyes, or preservatives  pregnant or trying to get pregnant  breast-feeding How should I use this medicine? This medicine is for infusion into a vein. It is given by a health care professional in a hospital or clinic setting. Talk to your pediatrician regarding the use of this medicine in children. Special care may be needed. Overdosage: If you think you have taken too much of this medicine contact a poison control center or emergency room at once. NOTE: This medicine is only for you. Do not share this medicine with others. What if I miss a dose? It is important not to miss your dose. Call your doctor or health care professional if you are unable to keep an appointment. What may interact with this medicine?  certain antibiotics given by injection  NSAIDs, medicines for pain and inflammation, like ibuprofen or naproxen  some diuretics like bumetanide, furosemide  teriparatide  thalidomide This list may not describe all possible interactions. Give your health care provider a list of all the medicines, herbs, non-prescription  drugs, or dietary supplements you use. Also tell them if you smoke, drink alcohol, or use illegal drugs. Some items may interact with your medicine. What should I watch for while using this medicine? Visit your doctor or health care professional for regular checkups. It may be some time before you see the benefit from this medicine. Do not stop taking your medicine unless your doctor tells you to. Your doctor may order blood tests or other tests to see how you are doing. Women should inform their doctor if they wish to become pregnant or think they might be pregnant. There is a potential for serious side effects to an unborn child. Talk to your health care professional or pharmacist for more information. You should make sure that you get enough calcium and vitamin D while you are taking this medicine. Discuss the foods you eat and the vitamins you take with your health care professional. Some people who take this medicine have severe bone, joint, and/or muscle pain. This medicine may also increase your risk for jaw problems or a broken thigh bone. Tell your doctor right away if you have severe pain in your jaw, bones, joints, or muscles. Tell your doctor if you have any pain that does not go away or that gets worse. Tell your dentist and dental surgeon that you are taking this medicine. You should not have major dental surgery while on this medicine. See your dentist to have a dental exam and fix any dental problems before starting this medicine. Take good care of your teeth while on this medicine. Make sure you see your dentist for regular follow-up   appointments. What side effects may I notice from receiving this medicine? Side effects that you should report to your doctor or health care professional as soon as possible:  allergic reactions like skin rash, itching or hives, swelling of the face, lips, or tongue  anxiety, confusion, or depression  breathing problems  changes in vision  eye  pain  feeling faint or lightheaded, falls  jaw pain, especially after dental work  mouth sores  muscle cramps, stiffness, or weakness  redness, blistering, peeling or loosening of the skin, including inside the mouth  trouble passing urine or change in the amount of urine Side effects that usually do not require medical attention (report to your doctor or health care professional if they continue or are bothersome):  bone, joint, or muscle pain  constipation  diarrhea  fever  hair loss  irritation at site where injected  loss of appetite  nausea, vomiting  stomach upset  trouble sleeping  trouble swallowing  weak or tired This list may not describe all possible side effects. Call your doctor for medical advice about side effects. You may report side effects to FDA at 1-800-FDA-1088. Where should I keep my medicine? This drug is given in a hospital or clinic and will not be stored at home. NOTE: This sheet is a summary. It may not cover all possible information. If you have questions about this medicine, talk to your doctor, pharmacist, or health care provider.  2020 Elsevier/Gold Standard (2013-06-10 14:19:39)  

## 2019-08-30 ENCOUNTER — Other Ambulatory Visit: Payer: Self-pay | Admitting: Family

## 2019-08-30 DIAGNOSIS — I82412 Acute embolism and thrombosis of left femoral vein: Secondary | ICD-10-CM

## 2019-08-30 DIAGNOSIS — I2699 Other pulmonary embolism without acute cor pulmonale: Secondary | ICD-10-CM

## 2019-09-07 ENCOUNTER — Encounter: Payer: Self-pay | Admitting: Emergency Medicine

## 2019-09-07 ENCOUNTER — Ambulatory Visit (INDEPENDENT_AMBULATORY_CARE_PROVIDER_SITE_OTHER): Payer: Medicare Other | Admitting: Emergency Medicine

## 2019-09-07 ENCOUNTER — Other Ambulatory Visit: Payer: Self-pay

## 2019-09-07 DIAGNOSIS — K219 Gastro-esophageal reflux disease without esophagitis: Secondary | ICD-10-CM | POA: Diagnosis not present

## 2019-09-07 DIAGNOSIS — I2699 Other pulmonary embolism without acute cor pulmonale: Secondary | ICD-10-CM | POA: Diagnosis not present

## 2019-09-07 DIAGNOSIS — J449 Chronic obstructive pulmonary disease, unspecified: Secondary | ICD-10-CM

## 2019-09-07 NOTE — Progress Notes (Signed)
Subjective:    Patient ID: [name], female    DOB: December 09, 1945, 74 y.o.   MRN: [id number]  HPI  ROV 07/22/16 -- patient has a history of severe obstructive lung disease with a bronchodilator response on spirometry. We have been managing her with Symbicort since January, using 1 puffs bid instead of 2. She was dealing with severe cough through the Spring. Then she had thoracic compression fractures so her exercise has been minimal. Difficult to gage her exercise tolerance right now. She is concerned about the steroid dose, interested in decreasing to 80/4.5. Rarely needs albuterol. No flares.   ROV 05/24/19 --[name] is 74 and has a history of severe obstructive lung disease with a bronchodilator response on spirometry.  She also has chronic cough with contributions from allergic rhinitis and also GERD.  She has a history of polycythemia vera and hemachromatosis and underwent lower extremity Doppler studies in February 2020 for bilateral lower extremity edema, found to have B DVT.  Subsequent CT chest 03/17/2019 reviewed by me, shows multiple bilateral subsegmental pulmonary emboli.  She is on Xarelto since the dx. Following with Dr [name].  She remains on Symbicort. Still has exertional dyspnea, better at rest. Had difficulty with walking through this office. She uses albuterol 0-2x a day, more lately due to the allergy season. She has daily cough. Minimal breakthrough reflux sx.   ROV 09/07/19 --follow-up visit for 74 year old woman with severe COPD with a bronchodilator response, chronic cough in the setting of this as well as allergic rhinitis and GERD. She also has a history of polycythemia vera, hemochromatosis, recent diagnosis of DVT and bilateral PE. She reports today that her breathing has been stable. She can ambulate with a cane. She is sometimes limited by her breathing - she had difficulty getting in from the parking lot. She is on Symbicort, is worried about the cost. She is on  protonix, was originally started due to her cough. Cough is on-productive. She has PND but does not want to be on any other meds to address this..   A repeat CT chest 07/06/19 reviewed by me shows no residual evidence of pulmonary embolism, no infiltrates.   Review of Systems  Constitutional: Negative for fever and unexpected weight change.  HENT: Positive for congestion, postnasal drip and sinus pressure. Negative for dental problem, ear pain, nosebleeds, rhinorrhea, sneezing, sore throat and trouble swallowing.   Eyes: Negative for redness and itching.  Respiratory: Positive for cough, chest tightness and shortness of breath. Negative for wheezing.   Cardiovascular: Negative for palpitations and leg swelling.  Gastrointestinal: Negative for nausea and vomiting.  Genitourinary: Negative for dysuria.  Musculoskeletal: Negative for joint swelling.  Skin: Negative for rash.  Neurological: Negative for headaches.  Hematological: Does not bruise/bleed easily.  Psychiatric/Behavioral: Negative for dysphoric mood. The patient is not nervous/anxious.           Objective:   Physical Exam Vitals:   09/07/19 1558  BP: 122/68  Pulse: 80  Temp: (!) 97.2 F (36.2 C)  TempSrc: Temporal  SpO2: 97%  Weight: 133 lb 12.8 oz (60.7 kg)  Height: 5\' 3"  (1.6 m)   Gen: Pleasant, well-nourished, in no distress,  normal affect, clearing her throat frequently  ENT: No lesions,  mouth clear,  oropharynx clear, no postnasal drip  Neck: No JVD, no stridor  Lungs: No use of accessory muscles, clear without rales or rhonchi, no wheeze  Cardiovascular: RRR, heart sounds normal, no murmur or gallops, no  peripheral edema  Musculoskeletal: No deformities, no cyanosis or clubbing  Neuro: alert, non focal  Skin: Warm, no lesions or rashes       Assessment & Plan:  COPD (chronic obstructive pulmonary disease) (HCC) Overall stable but remains limited.  She does not want to add LAMA, is actually  interested in possibly changing Symbicort to an alternative that may be more affordable.  We will review her formulary and see if that is possible.  Pulmonary embolism (HCC) Continue Xarelto as ordered.  Duration of therapy may be influenced by the fact that she has polycythemia.  I will defer this to Dr. Marin Olp  Laryngopharyngeal reflux (LPR) With upper airway irritation.  Lots of throat clearing, some cough.  She is on therapy for GERD.  She still has PND and is untreated but she does not want to add any other medications to her routine.  We will continue to follow.  Baltazar Apo, MD, PhD 09/07/2019, 4:22 PM [facility] [phone] or if no answer [phone]

## 2019-09-07 NOTE — Assessment & Plan Note (Signed)
Overall stable but remains limited.  She does not want to add LAMA, is actually interested in possibly changing Symbicort to an alternative that may be more affordable.  We will review her formulary and see if that is possible.

## 2019-09-07 NOTE — Assessment & Plan Note (Signed)
With upper airway irritation.  Lots of throat clearing, some cough.  She is on therapy for GERD.  She still has PND and is untreated but she does not want to add any other medications to her routine.  We will continue to follow.

## 2019-09-07 NOTE — Assessment & Plan Note (Signed)
Continue Xarelto as ordered.  Duration of therapy may be influenced by the fact that she has polycythemia.  I will defer this to Dr. Marin Olp

## 2019-09-07 NOTE — Patient Instructions (Addendum)
Please continue your Symbicort 2 puffs twice a day.  Rinse and gargle after using. Keep your albuterol available to use 2 puffs if needed for shortness of breath, chest tightness, wheezing. Get copy of your insurance formulary for your inhalers for [place] to review.  This will allow [place] to see if there is a more affordable inhaler that we could change to in place of Symbicort Continue your Protonix (pantoprazole) as you have been taking it Continue your Xarelto as directed by Dr. Marin Olp Follow with Dr Lamonte Sakai in 6 months or sooner if you have any problems

## 2019-09-13 DIAGNOSIS — Z23 Encounter for immunization: Secondary | ICD-10-CM | POA: Diagnosis not present

## 2019-10-04 ENCOUNTER — Telehealth: Payer: Self-pay | Admitting: Family

## 2019-10-04 NOTE — Telephone Encounter (Signed)
Returned 9/8  call to patient she requested to change 9/9 appt .  Appointments moved out to 9/13 per her request

## 2019-10-05 ENCOUNTER — Ambulatory Visit: Payer: Medicare Other | Admitting: Family

## 2019-10-05 ENCOUNTER — Ambulatory Visit: Payer: Medicare Other

## 2019-10-05 ENCOUNTER — Other Ambulatory Visit: Payer: Medicare Other

## 2019-10-06 ENCOUNTER — Inpatient Hospital Stay: Payer: Medicare Other

## 2019-10-06 ENCOUNTER — Inpatient Hospital Stay: Payer: Medicare Other | Admitting: Hematology & Oncology

## 2019-10-09 ENCOUNTER — Inpatient Hospital Stay: Payer: Medicare Other | Attending: Hematology & Oncology

## 2019-10-09 ENCOUNTER — Encounter: Payer: Self-pay | Admitting: Family

## 2019-10-09 ENCOUNTER — Inpatient Hospital Stay (HOSPITAL_BASED_OUTPATIENT_CLINIC_OR_DEPARTMENT_OTHER): Payer: Medicare Other | Admitting: Family

## 2019-10-09 ENCOUNTER — Telehealth: Payer: Self-pay | Admitting: Family

## 2019-10-09 ENCOUNTER — Inpatient Hospital Stay: Payer: Medicare Other

## 2019-10-09 ENCOUNTER — Other Ambulatory Visit: Payer: Self-pay

## 2019-10-09 VITALS — BP 124/72 | HR 70 | Temp 98.3°F | Resp 18 | Ht 62.0 in | Wt 132.1 lb

## 2019-10-09 DIAGNOSIS — R109 Unspecified abdominal pain: Secondary | ICD-10-CM | POA: Diagnosis not present

## 2019-10-09 DIAGNOSIS — D45 Polycythemia vera: Secondary | ICD-10-CM | POA: Insufficient documentation

## 2019-10-09 DIAGNOSIS — Z7901 Long term (current) use of anticoagulants: Secondary | ICD-10-CM | POA: Insufficient documentation

## 2019-10-09 DIAGNOSIS — K5900 Constipation, unspecified: Secondary | ICD-10-CM | POA: Insufficient documentation

## 2019-10-09 DIAGNOSIS — D6862 Lupus anticoagulant syndrome: Secondary | ICD-10-CM | POA: Insufficient documentation

## 2019-10-09 DIAGNOSIS — R0602 Shortness of breath: Secondary | ICD-10-CM | POA: Diagnosis not present

## 2019-10-09 DIAGNOSIS — R197 Diarrhea, unspecified: Secondary | ICD-10-CM | POA: Diagnosis not present

## 2019-10-09 DIAGNOSIS — Z79899 Other long term (current) drug therapy: Secondary | ICD-10-CM | POA: Diagnosis not present

## 2019-10-09 DIAGNOSIS — I2609 Other pulmonary embolism with acute cor pulmonale: Secondary | ICD-10-CM

## 2019-10-09 DIAGNOSIS — I82402 Acute embolism and thrombosis of unspecified deep veins of left lower extremity: Secondary | ICD-10-CM | POA: Diagnosis not present

## 2019-10-09 DIAGNOSIS — I82422 Acute embolism and thrombosis of left iliac vein: Secondary | ICD-10-CM

## 2019-10-09 DIAGNOSIS — E7211 Homocystinuria: Secondary | ICD-10-CM | POA: Diagnosis not present

## 2019-10-09 DIAGNOSIS — D510 Vitamin B12 deficiency anemia due to intrinsic factor deficiency: Secondary | ICD-10-CM

## 2019-10-09 DIAGNOSIS — R760 Raised antibody titer: Secondary | ICD-10-CM | POA: Diagnosis not present

## 2019-10-09 DIAGNOSIS — R5383 Other fatigue: Secondary | ICD-10-CM | POA: Diagnosis not present

## 2019-10-09 DIAGNOSIS — D509 Iron deficiency anemia, unspecified: Secondary | ICD-10-CM | POA: Insufficient documentation

## 2019-10-09 DIAGNOSIS — I2699 Other pulmonary embolism without acute cor pulmonale: Secondary | ICD-10-CM

## 2019-10-09 LAB — CMP (CANCER CENTER ONLY)
ALT: 28 U/L (ref 0–44)
AST: 41 U/L (ref 15–41)
Albumin: 3.8 g/dL (ref 3.5–5.0)
Alkaline Phosphatase: 77 U/L (ref 38–126)
Anion gap: 9 (ref 5–15)
BUN: 10 mg/dL (ref 8–23)
CO2: 30 mmol/L (ref 22–32)
Calcium: 9.8 mg/dL (ref 8.9–10.3)
Chloride: 99 mmol/L (ref 98–111)
Creatinine: 0.67 mg/dL (ref 0.44–1.00)
GFR, Est AFR Am: >60 mL/min (ref >60)
GFR, Estimated: >60 mL/min (ref >60)
Glucose, Bld: 106 mg/dL — ABNORMAL HIGH (ref 70–99)
Potassium: 5.1 mmol/L (ref 3.5–5.1)
Sodium: 138 mmol/L (ref 135–145)
Total Bilirubin: 0.9 mg/dL (ref 0.3–1.2)
Total Protein: 7.1 g/dL (ref 6.5–8.1)

## 2019-10-09 LAB — D-DIMER, QUANTITATIVE: D-Dimer, Quant: <0.27 ug/mL-FEU (ref 0.00–0.50)

## 2019-10-09 LAB — CBC WITH DIFFERENTIAL (CANCER CENTER ONLY)
Abs Immature Granulocytes: 0.04 10*3/uL (ref 0.00–0.07)
Basophils Absolute: 0 10*3/uL (ref 0.0–0.1)
Basophils Relative: 1 %
Eosinophils Absolute: 0.2 10*3/uL (ref 0.0–0.5)
Eosinophils Relative: 2 %
HCT: 46.4 % — ABNORMAL HIGH (ref 36.0–46.0)
Hemoglobin: 15.2 g/dL — ABNORMAL HIGH (ref 12.0–15.0)
Immature Granulocytes: 1 %
Lymphocytes Relative: 14 %
Lymphs Abs: 1.1 10*3/uL (ref 0.7–4.0)
MCH: 31 pg (ref 26.0–34.0)
MCHC: 32.8 g/dL (ref 30.0–36.0)
MCV: 94.7 fL (ref 80.0–100.0)
Monocytes Absolute: 0.8 10*3/uL (ref 0.1–1.0)
Monocytes Relative: 11 %
Neutro Abs: 5.3 10*3/uL (ref 1.7–7.7)
Neutrophils Relative %: 71 %
Platelet Count: 157 10*3/uL (ref 150–400)
RBC: 4.9 MIL/uL (ref 3.87–5.11)
RDW: 13.8 % (ref 11.5–15.5)
WBC Count: 7.4 10*3/uL (ref 4.0–10.5)
nRBC: 0 % (ref 0.0–0.2)

## 2019-10-09 NOTE — Progress Notes (Signed)
Hematology and Oncology Follow Up Visit  [name] [id number] March 26, 1945 74 y.o. 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 Daily  IV iron as indicated Zometa IV every 6 months- due again inJanuary 2022   Interim History:  [name] is here today for follow-up and phlebotomy, Hct is 46.4%. She is symptomatic with fatigue.  She has SOB with over exertion and  at times. She states that she sleeps well at night but needs a nap around 11 am.  She has not noted any blood loss but does bruise easily. No petechiae.  No fever, chills, n/v, cough, rash, dizziness, chest pain, palpitations or changes in bowel or bladder habits.  She fluctuates between diarrhea and constipation and has associated abdominal discomfort at times.  No swelling, tenderness, numbness or tingling in her extremities.  No falls or syncopal episodes to report.  She has a good appetite and is hydrating. Her weight is stable.   ECOG Performance Status: 1 - Symptomatic but completely ambulatory  Medications:  Allergies as of 10/09/2019      Reactions   Dulera [mometasone Furo-formoterol Fum] Cough      Medication List       Accurate as of October 09, 2019  2:18 PM. If you have any questions, ask your nurse or doctor.        albuterol 108 (90 Base) MCG/ACT inhaler Commonly known as: VENTOLIN HFA Inhale 2 puffs into the lungs every 4 (four) hours as needed for wheezing or shortness of breath.   dicyclomine 20 MG tablet Commonly known as: BENTYL Take 1 tablet by mouth as needed.   folic  acid 1 MG tablet Commonly known as: FOLVITE Take 1 tablet (1 mg total) by mouth daily.   LORazepam 0.5 MG tablet Commonly known as: ATIVAN TAKE 1 TABLET BY MOUTH EVERY NIGHT AT BEDTIME   metroNIDAZOLE 0.75 % cream Commonly known as: METROCREAM APPLY ON THE FACE BID AS NEEDED FOR FLARES   pantoprazole 40 MG tablet Commonly known as: PROTONIX take 1 tablet by mouth daily before meals   propranolol 20 MG tablet Commonly known as: INDERAL Take 20 mg by mouth daily.   sertraline 100 MG tablet Commonly known as: ZOLOFT Take 100 mg by mouth daily.   Symbicort 80-4.5 MCG/ACT inhaler Generic drug: budesonide-formoterol INHALE 2 PUFFS INTO THE LUNGS TWICE DAILY   Vitamin D (Ergocalciferol) 1.25 MG (50000 UNIT) Caps capsule Commonly known as: DRISDOL Take 1 capsule (50,000 Units total) by mouth every 7 (seven) days. Sundays   Xarelto 20 MG Tabs tablet Generic drug: rivaroxaban TAKE 1 TABLET(20 MG) BY MOUTH DAILY WITH SUPPER       Allergies:  Allergies  Allergen Reactions   Dulera [Mometasone Furo-Formoterol Fum] Cough    Past Medical History, Surgical history, Social history, and Family History were reviewed and updated.  Review of Systems: All other 10 point review of systems is negative.   Physical Exam:  height is 5\' 2"  (1.575 m) and weight is 132 lb 1.9 oz (59.9 kg). Her oral temperature is 98.3 F (36.8 C). Her blood pressure is 124/72 and her pulse is 70. Her respiration  is 18 and oxygen saturation is 98%.   Wt Readings from Last 3 Encounters:  10/09/19 132 lb 1.9 oz (59.9 kg)  09/07/19 133 lb 12.8 oz (60.7 kg)  05/24/19 134 lb 3.2 oz (60.9 kg)    Ocular: Sclerae unicteric, pupils equal, round and reactive to light Ear-nose-throat: Oropharynx clear, dentition fair Lymphatic: No cervical or supraclavicular adenopathy Lungs no rales or rhonchi, good excursion bilaterally Heart regular rate and rhythm, no murmur appreciated Abd soft, nontender, positive bowel  sounds MSK no focal spinal tenderness, no joint edema Neuro: non-focal, well-oriented, appropriate affect Breasts: Deferred   Lab Results  Component Value Date   WBC 7.4 10/09/2019   HGB 15.2 (H) 10/09/2019   HCT 46.4 (H) 10/09/2019   MCV 94.7 10/09/2019   PLT 157 10/09/2019   Lab Results  Component Value Date   FERRITIN 21 07/06/2019   IRON 41 07/06/2019   TIBC 383 07/06/2019   UIBC 342 07/06/2019   IRONPCTSAT 11 (L) 07/06/2019   Lab Results  Component Value Date   RETICCTPCT 1.5 06/12/2014   RBC 4.90 10/09/2019   RETICCTABS 65.9 06/12/2014   Lab Results  Component Value Date   KAPLAMBRATIO 0.91 04/27/2016   Lab Results  Component Value Date   IGGSERUM 897 04/27/2016   IGA 339 02/27/2014   IGMSERUM 132 04/27/2016   Lab Results  Component Value Date   MSPIKE Not Observed 04/27/2016     Chemistry      Component Value Date/Time   NA 136 08/07/2019 1331   NA 144 01/20/2017 1344   NA 138 01/17/2016 1356   K 3.8 08/07/2019 1331   K 3.6 01/20/2017 1344   K 4.1 01/17/2016 1356   CL 99 08/07/2019 1331   CL 107 01/20/2017 1344   CO2 27 08/07/2019 1331   CO2 25 01/20/2017 1344   CO2 25 01/17/2016 1356   BUN 14 08/07/2019 1331   BUN 12 01/20/2017 1344   BUN 12.3 01/17/2016 1356   CREATININE 0.67 08/07/2019 1331   CREATININE 0.7 01/20/2017 1344   CREATININE 0.8 01/17/2016 1356      Component Value Date/Time   CALCIUM 9.1 08/07/2019 1331   CALCIUM 8.9 01/20/2017 1344   CALCIUM 9.1 01/17/2016 1356   ALKPHOS 69 08/07/2019 1331   ALKPHOS 68 01/20/2017 1344   ALKPHOS 87 01/17/2016 1356   AST 38 08/07/2019 1331   AST 48 (H) 01/17/2016 1356   ALT 25 08/07/2019 1331   ALT 49 (H) 01/20/2017 1344   ALT 38 01/17/2016 1356   BILITOT 0.9 08/07/2019 1331   BILITOT 0.67 01/17/2016 1356       Impression and Plan: [name] is a very pleasant 74yo caucasian female withpolycythemia, 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She was  diagnosed with extensive left lower extremity DVT and small right PE in February 2021. She had a mildly elevated homocystine level and positive lupus anticoagulant.  She is taking her folic acid as prescribed.  She will have a phlebotomy and receive replacement fluids today.  We will see her again in 8 weeks.  She can contact our office with any questions or concerns.   Laverna Peace, NP 9/13/20212:18 PM

## 2019-10-09 NOTE — Telephone Encounter (Signed)
Appointments scheduled calendar printed per 9/13 los

## 2019-10-10 LAB — FERRITIN: Ferritin: 36 ng/mL (ref 11–307)

## 2019-10-10 LAB — IRON AND TIBC
Iron: 121 ug/dL (ref 41–142)
Saturation Ratios: 31 % (ref 21–57)
TIBC: 390 ug/dL (ref 236–444)
UIBC: 269 ug/dL (ref 120–384)

## 2019-10-10 LAB — LUPUS ANTICOAGULANT PANEL
DRVVT: 40.7 s (ref 0.0–47.0)
PTT Lupus Anticoagulant: 33.8 s (ref 0.0–51.9)

## 2019-11-13 ENCOUNTER — Other Ambulatory Visit: Payer: Self-pay | Admitting: Emergency Medicine

## 2019-11-27 DIAGNOSIS — K219 Gastro-esophageal reflux disease without esophagitis: Secondary | ICD-10-CM | POA: Diagnosis not present

## 2019-11-27 DIAGNOSIS — R6889 Other general symptoms and signs: Secondary | ICD-10-CM | POA: Diagnosis not present

## 2019-12-04 ENCOUNTER — Inpatient Hospital Stay (HOSPITAL_BASED_OUTPATIENT_CLINIC_OR_DEPARTMENT_OTHER): Payer: Medicare Other | Admitting: Family

## 2019-12-04 ENCOUNTER — Telehealth: Payer: Self-pay | Admitting: Family

## 2019-12-04 ENCOUNTER — Inpatient Hospital Stay: Payer: Medicare Other

## 2019-12-04 ENCOUNTER — Other Ambulatory Visit: Payer: Self-pay

## 2019-12-04 ENCOUNTER — Encounter: Payer: Self-pay | Admitting: Family

## 2019-12-04 ENCOUNTER — Inpatient Hospital Stay: Payer: Medicare Other | Attending: Hematology & Oncology

## 2019-12-04 VITALS — BP 123/64 | HR 72 | Temp 97.9°F | Resp 18 | Ht 62.0 in | Wt 133.0 lb

## 2019-12-04 DIAGNOSIS — Z7901 Long term (current) use of anticoagulants: Secondary | ICD-10-CM | POA: Diagnosis not present

## 2019-12-04 DIAGNOSIS — I82422 Acute embolism and thrombosis of left iliac vein: Secondary | ICD-10-CM | POA: Diagnosis not present

## 2019-12-04 DIAGNOSIS — D45 Polycythemia vera: Secondary | ICD-10-CM | POA: Diagnosis not present

## 2019-12-04 DIAGNOSIS — Z86718 Personal history of other venous thrombosis and embolism: Secondary | ICD-10-CM | POA: Insufficient documentation

## 2019-12-04 DIAGNOSIS — D6862 Lupus anticoagulant syndrome: Secondary | ICD-10-CM | POA: Insufficient documentation

## 2019-12-04 DIAGNOSIS — I2699 Other pulmonary embolism without acute cor pulmonale: Secondary | ICD-10-CM

## 2019-12-04 DIAGNOSIS — M810 Age-related osteoporosis without current pathological fracture: Secondary | ICD-10-CM | POA: Diagnosis not present

## 2019-12-04 DIAGNOSIS — R5383 Other fatigue: Secondary | ICD-10-CM | POA: Insufficient documentation

## 2019-12-04 DIAGNOSIS — K219 Gastro-esophageal reflux disease without esophagitis: Secondary | ICD-10-CM | POA: Insufficient documentation

## 2019-12-04 DIAGNOSIS — R053 Chronic cough: Secondary | ICD-10-CM | POA: Diagnosis not present

## 2019-12-04 DIAGNOSIS — R760 Raised antibody titer: Secondary | ICD-10-CM | POA: Diagnosis not present

## 2019-12-04 DIAGNOSIS — E7211 Homocystinuria: Secondary | ICD-10-CM

## 2019-12-04 LAB — CBC WITH DIFFERENTIAL (CANCER CENTER ONLY)
Abs Immature Granulocytes: 0.02 10*3/uL (ref 0.00–0.07)
Basophils Absolute: 0 10*3/uL (ref 0.0–0.1)
Basophils Relative: 1 %
Eosinophils Absolute: 0.2 10*3/uL (ref 0.0–0.5)
Eosinophils Relative: 3 %
HCT: 43.2 % (ref 36.0–46.0)
Hemoglobin: 14 g/dL (ref 12.0–15.0)
Immature Granulocytes: 0 %
Lymphocytes Relative: 12 %
Lymphs Abs: 0.7 10*3/uL (ref 0.7–4.0)
MCH: 30.1 pg (ref 26.0–34.0)
MCHC: 32.4 g/dL (ref 30.0–36.0)
MCV: 92.9 fL (ref 80.0–100.0)
Monocytes Absolute: 0.7 10*3/uL (ref 0.1–1.0)
Monocytes Relative: 11 %
Neutro Abs: 4.7 10*3/uL (ref 1.7–7.7)
Neutrophils Relative %: 73 %
Platelet Count: 154 10*3/uL (ref 150–400)
RBC: 4.65 MIL/uL (ref 3.87–5.11)
RDW: 13 % (ref 11.5–15.5)
WBC Count: 6.3 10*3/uL (ref 4.0–10.5)
nRBC: 0 % (ref 0.0–0.2)

## 2019-12-04 LAB — CMP (CANCER CENTER ONLY)
ALT: 24 U/L (ref 0–44)
AST: 36 U/L (ref 15–41)
Albumin: 3.8 g/dL (ref 3.5–5.0)
Alkaline Phosphatase: 76 U/L (ref 38–126)
Anion gap: 8 (ref 5–15)
BUN: 12 mg/dL (ref 8–23)
CO2: 30 mmol/L (ref 22–32)
Calcium: 9.8 mg/dL (ref 8.9–10.3)
Chloride: 99 mmol/L (ref 98–111)
Creatinine: 0.63 mg/dL (ref 0.44–1.00)
GFR, Estimated: >60 mL/min (ref >60)
Glucose, Bld: 111 mg/dL — ABNORMAL HIGH (ref 70–99)
Potassium: 3.9 mmol/L (ref 3.5–5.1)
Sodium: 137 mmol/L (ref 135–145)
Total Bilirubin: 0.7 mg/dL (ref 0.3–1.2)
Total Protein: 7.1 g/dL (ref 6.5–8.1)

## 2019-12-04 NOTE — Telephone Encounter (Signed)
Appointments scheduled calendar printed & mailed per 11/8 los 

## 2019-12-04 NOTE — Progress Notes (Signed)
Hematology and Oncology Follow Up Visit  [name] [id number] [birth year] 74 y.o. 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 is here today for follow-up. She is feeling fatigued and having issues with chronic cough due to GERD. This is keeping her up at night.  GI recently increased her Protonix to twice daily and she is hoping this will help.  She has SOB due to asthma exacerbation at times.  No fever, chills, n/v, cough, rash, dizziness, chest pain, palpitations, abdominal pain or changes in bowel or bladder habits.  No swelling, numbness or tingling in her extremities.  She has pain at times in her left thigh. This comes and goes. She will try wearing compression stockings for added support.  No falls or syncopal episodes to report.  She has a good appetite and is staying well hydrated. Her weight is stable at 133 lbs.   ECOG Performance Status: 1 - Symptomatic but completely ambulatory  Medications:  Allergies as of 12/04/2019      Reactions   Dulera [mometasone Furo-formoterol Fum] Cough      Medication List       Accurate as of December 04, 2019 12:00 PM. If you have any questions, ask your nurse or doctor.        albuterol 108 (90 Base) MCG/ACT inhaler Commonly known as: VENTOLIN HFA Inhale 2 puffs into the lungs every 4 (four) hours as needed for wheezing or shortness of breath.   budesonide-formoterol 80-4.5 MCG/ACT inhaler Commonly known as: SYMBICORT INHALE 2  PUFFS INTO THE LUNGS TWICE DAILY   dicyclomine 20 MG tablet Commonly known as: BENTYL Take 1 tablet by mouth as needed.   folic acid 1 MG tablet Commonly known as: FOLVITE Take 1 tablet (1 mg total) by mouth daily.   LORazepam 0.5 MG tablet Commonly known as: ATIVAN TAKE 1 TABLET BY MOUTH EVERY NIGHT AT BEDTIME   metroNIDAZOLE 0.75 % cream Commonly known as: METROCREAM APPLY ON THE FACE BID AS NEEDED FOR FLARES   pantoprazole 40 MG tablet Commonly known as: PROTONIX take 1 tablet by mouth daily before meals   propranolol 20 MG tablet Commonly known as: INDERAL Take 20 mg by mouth daily.   sertraline 100 MG tablet Commonly known as: ZOLOFT Take 100 mg by mouth daily.   Vitamin D (Ergocalciferol) 1.25 MG (50000 UNIT) Caps capsule Commonly known as: DRISDOL Take 1 capsule (50,000 Units total) by mouth every 7 (seven) days. Sundays   Xarelto 20 MG Tabs tablet Generic drug: rivaroxaban TAKE 1 TABLET(20 MG) BY MOUTH DAILY WITH SUPPER       Allergies:  Allergies  Allergen Reactions  . Dulera [Mometasone Furo-Formoterol Fum] Cough    Past Medical History, Surgical history, Social history, and Family History were reviewed and updated.  Review of Systems: All other 10 point review of systems is negative.   Physical Exam:  height is 5\' 2"  (1.575 m) and weight is 133 lb (60.3 kg). Her oral temperature is 97.9 F (36.6 C). Her blood pressure is 123/64 and her pulse is 72.  Her respiration is 18 and oxygen saturation is 98%.   Wt Readings from Last 3 Encounters:  12/04/19 133 lb (60.3 kg)  10/09/19 132 lb 1.9 oz (59.9 kg)  09/07/19 133 lb 12.8 oz (60.7 kg)    Ocular: Sclerae unicteric, pupils equal, round and reactive to light Ear-nose-throat: Oropharynx clear, dentition fair Lymphatic: No cervical or supraclavicular adenopathy Lungs no rales or rhonchi, good excursion bilaterally Heart regular rate and rhythm, no murmur appreciated Abd soft, nontender, positive  bowel sounds MSK no focal spinal tenderness, no joint edema Neuro: non-focal, well-oriented, appropriate affect Breasts: Deferred   Lab Results  Component Value Date   WBC 6.3 12/04/2019   HGB 14.0 12/04/2019   HCT 43.2 12/04/2019   MCV 92.9 12/04/2019   PLT 154 12/04/2019   Lab Results  Component Value Date   FERRITIN 36 10/09/2019   IRON 121 10/09/2019   TIBC 390 10/09/2019   UIBC 269 10/09/2019   IRONPCTSAT 31 10/09/2019   Lab Results  Component Value Date   RETICCTPCT 1.5 06/12/2014   RBC 4.65 12/04/2019   RETICCTABS 65.9 06/12/2014   Lab Results  Component Value Date   KAPLAMBRATIO 0.91 04/27/2016   Lab Results  Component Value Date   IGGSERUM 897 04/27/2016   IGA 339 02/27/2014   IGMSERUM 132 04/27/2016   Lab Results  Component Value Date   MSPIKE Not Observed 04/27/2016     Chemistry      Component Value Date/Time   NA 137 12/04/2019 1045   NA 144 01/20/2017 1344   NA 138 01/17/2016 1356   K 3.9 12/04/2019 1045   K 3.6 01/20/2017 1344   K 4.1 01/17/2016 1356   CL 99 12/04/2019 1045   CL 107 01/20/2017 1344   CO2 30 12/04/2019 1045   CO2 25 01/20/2017 1344   CO2 25 01/17/2016 1356   BUN 12 12/04/2019 1045   BUN 12 01/20/2017 1344   BUN 12.3 01/17/2016 1356   CREATININE 0.63 12/04/2019 1045   CREATININE 0.7 01/20/2017 1344   CREATININE 0.8 01/17/2016 1356      Component Value Date/Time   CALCIUM 9.8 12/04/2019 1045   CALCIUM 8.9 01/20/2017 1344   CALCIUM 9.1 01/17/2016 1356   ALKPHOS 76 12/04/2019 1045   ALKPHOS 68 01/20/2017 1344   ALKPHOS 87 01/17/2016 1356   AST 36 12/04/2019 1045   AST 48 (H) 01/17/2016 1356   ALT 24 12/04/2019 1045   ALT 49 (H) 01/20/2017 1344   ALT 38 01/17/2016 1356   BILITOT 0.7 12/04/2019 1045   BILITOT 0.67 01/17/2016 1356       Impression and Plan: 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 She had a mildly elevated homocystine level and positive lupus anticoagulant.  No phlebotomy needed for Hct 43.2%.  Follow-up in 8 weeks.  She will be due for Zometa in January.  She can contact our office with any questions or concerns.   Laverna Peace, NP 11/8/202112:00 PM

## 2019-12-05 LAB — LUPUS ANTICOAGULANT PANEL
DRVVT: 45.3 s (ref 0.0–47.0)
PTT Lupus Anticoagulant: 36.6 s (ref 0.0–51.9)

## 2019-12-05 LAB — IRON AND TIBC
Iron: 53 ug/dL (ref 41–142)
Saturation Ratios: 14 % — ABNORMAL LOW (ref 21–57)
TIBC: 374 ug/dL (ref 236–444)
UIBC: 320 ug/dL (ref 120–384)

## 2019-12-05 LAB — HOMOCYSTEINE: Homocysteine: 13.1 umol/L (ref 0.0–19.2)

## 2019-12-05 LAB — FERRITIN: Ferritin: 28 ng/mL (ref 11–307)

## 2019-12-18 DIAGNOSIS — Z23 Encounter for immunization: Secondary | ICD-10-CM | POA: Diagnosis not present

## 2020-01-24 ENCOUNTER — Other Ambulatory Visit: Payer: Self-pay | Admitting: Family

## 2020-01-24 DIAGNOSIS — I82412 Acute embolism and thrombosis of left femoral vein: Secondary | ICD-10-CM

## 2020-01-24 DIAGNOSIS — I2699 Other pulmonary embolism without acute cor pulmonale: Secondary | ICD-10-CM

## 2020-01-30 DIAGNOSIS — D509 Iron deficiency anemia, unspecified: Secondary | ICD-10-CM | POA: Diagnosis not present

## 2020-01-30 DIAGNOSIS — F419 Anxiety disorder, unspecified: Secondary | ICD-10-CM | POA: Diagnosis not present

## 2020-01-30 DIAGNOSIS — D751 Secondary polycythemia: Secondary | ICD-10-CM | POA: Diagnosis not present

## 2020-01-30 DIAGNOSIS — E559 Vitamin D deficiency, unspecified: Secondary | ICD-10-CM | POA: Diagnosis not present

## 2020-01-30 DIAGNOSIS — F331 Major depressive disorder, recurrent, moderate: Secondary | ICD-10-CM | POA: Diagnosis not present

## 2020-01-30 DIAGNOSIS — I82402 Acute embolism and thrombosis of unspecified deep veins of left lower extremity: Secondary | ICD-10-CM | POA: Diagnosis not present

## 2020-01-30 DIAGNOSIS — M810 Age-related osteoporosis without current pathological fracture: Secondary | ICD-10-CM | POA: Diagnosis not present

## 2020-01-30 DIAGNOSIS — Z0000 Encounter for general adult medical examination without abnormal findings: Secondary | ICD-10-CM | POA: Diagnosis not present

## 2020-01-30 DIAGNOSIS — R059 Cough, unspecified: Secondary | ICD-10-CM | POA: Diagnosis not present

## 2020-01-30 DIAGNOSIS — G713 Mitochondrial myopathy, not elsewhere classified: Secondary | ICD-10-CM | POA: Diagnosis not present

## 2020-01-30 DIAGNOSIS — R251 Tremor, unspecified: Secondary | ICD-10-CM | POA: Diagnosis not present

## 2020-02-02 ENCOUNTER — Telehealth: Payer: Self-pay | Admitting: Family

## 2020-02-02 ENCOUNTER — Inpatient Hospital Stay (HOSPITAL_BASED_OUTPATIENT_CLINIC_OR_DEPARTMENT_OTHER): Payer: Medicare Other | Admitting: Family

## 2020-02-02 ENCOUNTER — Inpatient Hospital Stay: Payer: Medicare Other

## 2020-02-02 ENCOUNTER — Inpatient Hospital Stay: Payer: Medicare Other | Attending: Hematology & Oncology

## 2020-02-02 ENCOUNTER — Encounter: Payer: Self-pay | Admitting: Family

## 2020-02-02 ENCOUNTER — Other Ambulatory Visit: Payer: Self-pay

## 2020-02-02 VITALS — BP 125/55 | HR 68 | Temp 97.6°F | Resp 17 | Ht 62.0 in | Wt 135.1 lb

## 2020-02-02 DIAGNOSIS — R760 Raised antibody titer: Secondary | ICD-10-CM

## 2020-02-02 DIAGNOSIS — M810 Age-related osteoporosis without current pathological fracture: Secondary | ICD-10-CM | POA: Insufficient documentation

## 2020-02-02 DIAGNOSIS — Z0000 Encounter for general adult medical examination without abnormal findings: Secondary | ICD-10-CM | POA: Diagnosis not present

## 2020-02-02 DIAGNOSIS — D45 Polycythemia vera: Secondary | ICD-10-CM | POA: Insufficient documentation

## 2020-02-02 DIAGNOSIS — D508 Other iron deficiency anemias: Secondary | ICD-10-CM | POA: Insufficient documentation

## 2020-02-02 DIAGNOSIS — Z7901 Long term (current) use of anticoagulants: Secondary | ICD-10-CM | POA: Diagnosis not present

## 2020-02-02 DIAGNOSIS — D751 Secondary polycythemia: Secondary | ICD-10-CM | POA: Diagnosis not present

## 2020-02-02 DIAGNOSIS — Z86711 Personal history of pulmonary embolism: Secondary | ICD-10-CM | POA: Insufficient documentation

## 2020-02-02 DIAGNOSIS — E559 Vitamin D deficiency, unspecified: Secondary | ICD-10-CM | POA: Diagnosis not present

## 2020-02-02 DIAGNOSIS — I2699 Other pulmonary embolism without acute cor pulmonale: Secondary | ICD-10-CM | POA: Diagnosis not present

## 2020-02-02 DIAGNOSIS — M8008XG Age-related osteoporosis with current pathological fracture, vertebra(e), subsequent encounter for fracture with delayed healing: Secondary | ICD-10-CM

## 2020-02-02 DIAGNOSIS — I82422 Acute embolism and thrombosis of left iliac vein: Secondary | ICD-10-CM | POA: Diagnosis not present

## 2020-02-02 DIAGNOSIS — Z79899 Other long term (current) drug therapy: Secondary | ICD-10-CM | POA: Diagnosis not present

## 2020-02-02 DIAGNOSIS — E7211 Homocystinuria: Secondary | ICD-10-CM

## 2020-02-02 LAB — CBC WITH DIFFERENTIAL (CANCER CENTER ONLY)
Abs Immature Granulocytes: 0.05 10*3/uL (ref 0.00–0.07)
Basophils Absolute: 0 10*3/uL (ref 0.0–0.1)
Basophils Relative: 1 %
Eosinophils Absolute: 0.1 10*3/uL (ref 0.0–0.5)
Eosinophils Relative: 1 %
HCT: 44.7 % (ref 36.0–46.0)
Hemoglobin: 14.4 g/dL (ref 12.0–15.0)
Immature Granulocytes: 1 %
Lymphocytes Relative: 15 %
Lymphs Abs: 1 10*3/uL (ref 0.7–4.0)
MCH: 29.4 pg (ref 26.0–34.0)
MCHC: 32.2 g/dL (ref 30.0–36.0)
MCV: 91.2 fL (ref 80.0–100.0)
Monocytes Absolute: 0.8 10*3/uL (ref 0.1–1.0)
Monocytes Relative: 12 %
Neutro Abs: 4.9 10*3/uL (ref 1.7–7.7)
Neutrophils Relative %: 70 %
Platelet Count: 176 10*3/uL (ref 150–400)
RBC: 4.9 MIL/uL (ref 3.87–5.11)
RDW: 14.7 % (ref 11.5–15.5)
WBC Count: 6.9 10*3/uL (ref 4.0–10.5)
nRBC: 0 % (ref 0.0–0.2)

## 2020-02-02 LAB — CMP (CANCER CENTER ONLY)
ALT: 25 U/L (ref 0–44)
AST: 38 U/L (ref 15–41)
Albumin: 3.9 g/dL (ref 3.5–5.0)
Alkaline Phosphatase: 77 U/L (ref 38–126)
Anion gap: 7 (ref 5–15)
BUN: 10 mg/dL (ref 8–23)
CO2: 31 mmol/L (ref 22–32)
Calcium: 9.9 mg/dL (ref 8.9–10.3)
Chloride: 98 mmol/L (ref 98–111)
Creatinine: 0.67 mg/dL (ref 0.44–1.00)
GFR, Estimated: >60 mL/min (ref >60)
Glucose, Bld: 79 mg/dL (ref 70–99)
Potassium: 4.4 mmol/L (ref 3.5–5.1)
Sodium: 136 mmol/L (ref 135–145)
Total Bilirubin: 0.9 mg/dL (ref 0.3–1.2)
Total Protein: 7.2 g/dL (ref 6.5–8.1)

## 2020-02-02 MED ORDER — SODIUM CHLORIDE 0.9 % IV SOLN
INTRAVENOUS | Status: DC
  Filled 2020-02-02 (×2): qty 250

## 2020-02-02 MED ORDER — ZOLEDRONIC ACID 4 MG/100ML IV SOLN
4.0000 mg | Freq: Once | INTRAVENOUS | Status: AC
  Administered 2020-02-02: 4 mg via INTRAVENOUS
  Filled 2020-02-02: qty 100

## 2020-02-02 NOTE — Progress Notes (Signed)
Hematology and Oncology Follow Up Visit  [name] [id number] 1945/12/23 75 y.o. 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 She is feeling fatigued and having generalized muscle aches and pains.  She notes SOB and chest discomfort with exacerbations of asthma.  Hct is 44.7%, Hgb 14.4, WBC count 6.9, platelets 176.  No fever, chills, n/v, cough, rash, dizziness, palpitations, abdominal pain or changes in bowel or bladder habits.  She is starting to notice an improvement in GERD symptoms now that she is taking her Protonix twice a day.  No swelling in her extremities at this time. Pedal pulses are 2+.  She is doing well on Xarelto. No blood loss noted. No abnormal bruising, no petechiae.  No falls or syncopal episodes to report.  Her appetite comes and goes. She is staying well hydrated. Her weight is stable at 135 lbs.   ECOG Performance Status: 1 - Symptomatic but completely ambulatory  Medications:  Allergies as of 02/02/2020      Reactions   Dulera [mometasone Furo-formoterol Fum] Cough      Medication List       Accurate as of February 02, 2020 11:14 AM. If you have any questions, ask your nurse or doctor.        albuterol 108 (90 Base) MCG/ACT inhaler Commonly known as: VENTOLIN HFA Inhale 2 puffs into the lungs every 4 (four) hours as needed for wheezing or shortness of breath.    budesonide-formoterol 80-4.5 MCG/ACT inhaler Commonly known as: SYMBICORT INHALE 2 PUFFS INTO THE LUNGS TWICE DAILY   dicyclomine 20 MG tablet Commonly known as: BENTYL Take 1 tablet by mouth as needed.   folic acid 1 MG tablet Commonly known as: FOLVITE Take 1 tablet (1 mg total) by mouth daily.   LORazepam 0.5 MG tablet Commonly known as: ATIVAN TAKE 1 TABLET BY MOUTH EVERY NIGHT AT BEDTIME   metroNIDAZOLE 0.75 % cream Commonly known as: METROCREAM APPLY ON THE FACE BID AS NEEDED FOR FLARES   pantoprazole 40 MG tablet Commonly known as: PROTONIX take 1 tablet by mouth daily before meals   propranolol 20 MG tablet Commonly known as: INDERAL Take 20 mg by mouth daily.   sertraline 100 MG tablet Commonly known as: ZOLOFT Take 100 mg by mouth daily.   Vitamin D (Ergocalciferol) 1.25 MG (50000 UNIT) Caps capsule Commonly known as: DRISDOL Take 1 capsule (50,000 Units total) by mouth every 7 (seven) days. Sundays   Xarelto 20 MG Tabs tablet Generic drug: rivaroxaban TAKE 1 TABLET(20 MG) BY MOUTH DAILY WITH SUPPER       Allergies:  Allergies  Allergen Reactions  . Dulera [Mometasone Furo-Formoterol Fum] Cough    Past Medical History, Surgical history, Social history, and Family History were reviewed and updated.  Review of Systems: All other 10 point review of systems is negative.   Physical Exam:  height is 5\' 2"  (1.575 m) and weight is 135 lb 1.3 oz (61.3 kg). Her oral temperature is 97.6  F (36.4 C). Her blood pressure is 125/55 (abnormal) and her pulse is 68. Her respiration is 17 and oxygen saturation is 97%.   Wt Readings from Last 3 Encounters:  02/02/20 135 lb 1.3 oz (61.3 kg)  12/04/19 133 lb (60.3 kg)  10/09/19 132 lb 1.9 oz (59.9 kg)    Ocular: Sclerae unicteric, pupils equal, round and reactive to light Ear-nose-throat: Oropharynx clear, dentition fair Lymphatic: No cervical or supraclavicular adenopathy Lungs no rales or rhonchi, good  excursion bilaterally Heart regular rate and rhythm, no murmur appreciated Abd soft, nontender, positive bowel sounds MSK no focal spinal tenderness, no joint edema Neuro: non-focal, well-oriented, appropriate affect Breasts: Deferred   Lab Results  Component Value Date   WBC 6.9 02/02/2020   HGB 14.4 02/02/2020   HCT 44.7 02/02/2020   MCV 91.2 02/02/2020   PLT 176 02/02/2020   Lab Results  Component Value Date   FERRITIN 28 12/04/2019   IRON 53 12/04/2019   TIBC 374 12/04/2019   UIBC 320 12/04/2019   IRONPCTSAT 14 (L) 12/04/2019   Lab Results  Component Value Date   RETICCTPCT 1.5 06/12/2014   RBC 4.90 02/02/2020   RETICCTABS 65.9 06/12/2014   Lab Results  Component Value Date   KAPLAMBRATIO 0.91 04/27/2016   Lab Results  Component Value Date   IGGSERUM 897 04/27/2016   IGA 339 02/27/2014   IGMSERUM 132 04/27/2016   Lab Results  Component Value Date   MSPIKE Not Observed 04/27/2016     Chemistry      Component Value Date/Time   NA 137 12/04/2019 1045   NA 144 01/20/2017 1344   NA 138 01/17/2016 1356   K 3.9 12/04/2019 1045   K 3.6 01/20/2017 1344   K 4.1 01/17/2016 1356   CL 99 12/04/2019 1045   CL 107 01/20/2017 1344   CO2 30 12/04/2019 1045   CO2 25 01/20/2017 1344   CO2 25 01/17/2016 1356   BUN 12 12/04/2019 1045   BUN 12 01/20/2017 1344   BUN 12.3 01/17/2016 1356   CREATININE 0.63 12/04/2019 1045   CREATININE 0.7 01/20/2017 1344   CREATININE 0.8 01/17/2016 1356      Component Value Date/Time   CALCIUM 9.8 12/04/2019 1045   CALCIUM 8.9 01/20/2017 1344   CALCIUM 9.1 01/17/2016 1356   ALKPHOS 76 12/04/2019 1045   ALKPHOS 68 01/20/2017 1344   ALKPHOS 87 01/17/2016 1356   AST 36 12/04/2019 1045   AST 48 (H) 01/17/2016 1356   ALT 24 12/04/2019 1045   ALT 49 (H) 01/20/2017 1344   ALT 38 01/17/2016 1356   BILITOT 0.7 12/04/2019 1045   BILITOT 0.67 01/17/2016 1356       Impression and Plan: [name] is a very pleasant 75yo caucasian  female withpolycythemia, 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She was diagnosed with extensive left lower extremity DVT and small right PEin February 2021 (resolved on CT angio and [place] in June 2021). She had a mildly elevated homocystine level and positive lupus anticoagulant.  She will do a partial phlebotomy (250 ml out) and replacement fluids after.  She will also get Zometa as well.  Follow-up in 8 weeks.  She can contact our office with any questions or concerns.   Laverna Peace, NP 1/7/202211:14 AM

## 2020-02-02 NOTE — Patient Instructions (Addendum)
Zoledronic Acid injection (Hypercalcemia, Oncology) What is this medicine? ZOLEDRONIC ACID (ZOE le dron ik AS id) lowers the amount of calcium loss from bone. It is used to treat too much calcium in your blood from cancer. It is also used to prevent complications of cancer that has spread to the bone. This medicine may be used for other purposes; ask your health care provider or pharmacist if you have questions. COMMON BRAND NAME(S): Zometa What should I tell my health care provider before I take this medicine? They need to know if you have any of these conditions:  aspirin-sensitive asthma  cancer, especially if you are receiving medicines used to treat cancer  dental disease or wear dentures  infection  kidney disease  receiving corticosteroids like dexamethasone or prednisone  an unusual or allergic reaction to zoledronic acid, other medicines, foods, dyes, or preservatives  pregnant or trying to get pregnant  breast-feeding How should I use this medicine? This medicine is for infusion into a vein. It is given by a health care professional in a hospital or clinic setting. Talk to your pediatrician regarding the use of this medicine in children. Special care may be needed. Overdosage: If you think you have taken too much of this medicine contact a poison control center or emergency room at once. NOTE: This medicine is only for you. Do not share this medicine with others. What if I miss a dose? It is important not to miss your dose. Call your doctor or health care professional if you are unable to keep an appointment. What may interact with this medicine?  certain antibiotics given by injection  NSAIDs, medicines for pain and inflammation, like ibuprofen or naproxen  some diuretics like bumetanide, furosemide  teriparatide  thalidomide This list may not describe all possible interactions. Give your health care provider a list of all the medicines, herbs, non-prescription  drugs, or dietary supplements you use. Also tell them if you smoke, drink alcohol, or use illegal drugs. Some items may interact with your medicine. What should I watch for while using this medicine? Visit your doctor or health care professional for regular checkups. It may be some time before you see the benefit from this medicine. Do not stop taking your medicine unless your doctor tells you to. Your doctor may order blood tests or other tests to see how you are doing. Women should inform their doctor if they wish to become pregnant or think they might be pregnant. There is a potential for serious side effects to an unborn child. Talk to your health care professional or pharmacist for more information. You should make sure that you get enough calcium and vitamin D while you are taking this medicine. Discuss the foods you eat and the vitamins you take with your health care professional. Some people who take this medicine have severe bone, joint, and/or muscle pain. This medicine may also increase your risk for jaw problems or a broken thigh bone. Tell your doctor right away if you have severe pain in your jaw, bones, joints, or muscles. Tell your doctor if you have any pain that does not go away or that gets worse. Tell your dentist and dental surgeon that you are taking this medicine. You should not have major dental surgery while on this medicine. See your dentist to have a dental exam and fix any dental problems before starting this medicine. Take good care of your teeth while on this medicine. Make sure you see your dentist for regular follow-up   appointments. What side effects may I notice from receiving this medicine? Side effects that you should report to your doctor or health care professional as soon as possible:  allergic reactions like skin rash, itching or hives, swelling of the face, lips, or tongue  anxiety, confusion, or depression  breathing problems  changes in vision  eye  pain  feeling faint or lightheaded, falls  jaw pain, especially after dental work  mouth sores  muscle cramps, stiffness, or weakness  redness, blistering, peeling or loosening of the skin, including inside the mouth  trouble passing urine or change in the amount of urine Side effects that usually do not require medical attention (report to your doctor or health care professional if they continue or are bothersome):  bone, joint, or muscle pain  constipation  diarrhea  fever  hair loss  irritation at site where injected  loss of appetite  nausea, vomiting  stomach upset  trouble sleeping  trouble swallowing  weak or tired This list may not describe all possible side effects. Call your doctor for medical advice about side effects. You may report side effects to FDA at 1-800-FDA-1088. Where should I keep my medicine? This drug is given in a hospital or clinic and will not be stored at home. NOTE: This sheet is a summary. It may not cover all possible information. If you have questions about this medicine, talk to your doctor, pharmacist, or health care provider.  2020 Elsevier/Gold Standard (2013-06-10 14:19:39) Dehydration, Adult Dehydration is condition in which there is not enough water or other fluids in the body. This happens when a person loses more fluids than he or she takes in. Important body parts cannot work right without the right amount of fluids. Any loss of fluids from the body can cause dehydration. Dehydration can be mild, worse, or very bad. It should be treated right away to keep it from getting very bad. What are the causes? This condition may be caused by:  Conditions that cause loss of water or other fluids, such as: ? Watery poop (diarrhea). ? Vomiting. ? Sweating a lot. ? Peeing (urinating) a lot.  Not drinking enough fluids, especially when you: ? Are ill. ? Are doing things that take a lot of energy to do.  Other illnesses and  conditions, such as fever or infection.  Certain medicines, such as medicines that take extra fluid out of the body (diuretics).  Lack of safe drinking water.  Not being able to get enough water and food. What increases the risk? The following factors may make you more likely to develop this condition:  Having a long-term (chronic) illness that has not been treated the right way, such as: ? Diabetes. ? Heart disease. ? Kidney disease.  Being 27 years of age or older.  Having a disability.  Living in a place that is high above the ground or sea (high in altitude). The thinner, dried air causes more fluid loss.  Doing exercises that put stress on your body for a long time. What are the signs or symptoms? Symptoms of dehydration depend on how bad it is. Mild or worse dehydration  Thirst.  Dry lips or dry mouth.  Feeling dizzy or light-headed, especially when you stand up from sitting.  Muscle cramps.  Your body making: ? Dark pee (urine). Pee may be the color of tea. ? Less pee than normal. ? Less tears than normal.  Headache. Very bad dehydration  Changes in skin. Skin may: ? Be  cold to the touch (clammy). ? Be blotchy or pale. ? Not go back to normal right after you lightly pinch it and let it go.  Little or no tears, pee, or sweat.  Changes in vital signs, such as: ? Fast breathing. ? Low blood pressure. ? Weak pulse. ? Pulse that is more than 100 beats a minute when you are sitting still.  Other changes, such as: ? Feeling very thirsty. ? Eyes that look hollow (sunken). ? Cold hands and feet. ? Being mixed up (confused). ? Being very tired (lethargic) or having trouble waking from sleep. ? Short-term weight loss. ? Loss of consciousness. How is this treated? Treatment for this condition depends on how bad it is. Treatment should start right away. Do not wait until your condition gets very bad. Very bad dehydration is an emergency. You will need to go  to a hospital.  Mild or worse dehydration can be treated at home. You may be asked to: ? Drink more fluids. ? Drink an oral rehydration solution (ORS). This drink helps get the right amounts of fluids and salts and minerals in the blood (electrolytes).  Very bad dehydration can be treated: ? With fluids through an IV tube. ? By getting normal levels of salts and minerals in your blood. This is often done by giving salts and minerals through a tube. The tube is passed through your nose and into your stomach. ? By treating the root cause. Follow these instructions at home: Oral rehydration solution If told by your doctor, drink an ORS:  Make an ORS. Use instructions on the package.  Start by drinking small amounts, about  cup (120 mL) every 5-10 minutes.  Slowly drink more until you have had the amount that your doctor said to have. Eating and drinking         Drink enough clear fluid to keep your pee pale yellow. If you were told to drink an ORS, finish the ORS first. Then, start slowly drinking other clear fluids. Drink fluids such as: ? Water. Do not drink only water. Doing that can make the salt (sodium) level in your body get too low. ? Water from ice chips you suck on. ? Fruit juice that you have added water to (diluted). ? Low-calorie sports drinks.  Eat foods that have the right amounts of salts and minerals, such as: ? Bananas. ? Oranges. ? Potatoes. ? Tomatoes. ? Spinach.  Do not drink alcohol.  Avoid: ? Drinks that have a lot of sugar. These include:  High-calorie sports drinks.  Fruit juice that you did not add water to.  Soda.  Caffeine. ? Foods that are greasy or have a lot of fat or sugar. General instructions  Take over-the-counter and prescription medicines only as told by your doctor.  Do not take salt tablets. Doing that can make the salt level in your body get too high.  Return to your normal activities as told by your doctor. Ask your  doctor what activities are safe for you.  Keep all follow-up visits as told by your doctor. This is important. Contact a doctor if:  You have pain in your belly (abdomen) and the pain: ? Gets worse. ? Stays in one place.  You have a rash.  You have a stiff neck.  You get angry or annoyed (irritable) more easily than normal.  You are more tired or have a harder time waking than normal.  You feel: ? Weak or dizzy. ? Very thirsty.  Get help right away if you have:  Any symptoms of very bad dehydration.  Symptoms of vomiting, such as: ? You cannot eat or drink without vomiting. ? Your vomiting gets worse or does not go away. ? Your vomit has blood or green stuff in it.  Symptoms that get worse with treatment.  A fever.  A very bad headache.  Problems with peeing or pooping (having a bowel movement), such as: ? Watery poop that gets worse or does not go away. ? Blood in your poop (stool). This may cause poop to look black and tarry. ? Not peeing in 6-8 hours. ? Peeing only a small amount of very dark pee in 6-8 hours.  Trouble breathing. These symptoms may be an emergency. Do not wait to see if the symptoms will go away. Get medical help right away. Call your local emergency services (911 in the [place] Do not drive yourself to the hospital. Summary  Dehydration is a condition in which there is not enough water or other fluids in the body. This happens when a person loses more fluids than he or she takes in.  Treatment for this condition depends on how bad it is. Treatment should be started right away. Do not wait until your condition gets very bad.  Drink enough clear fluid to keep your pee pale yellow. If you were told to drink an oral rehydration solution (ORS), finish the ORS first. Then, start slowly drinking other clear fluids.  Take over-the-counter and prescription medicines only as told by your doctor.  Get help right away if you have any symptoms of very bad  dehydration. This information is not intended to replace advice given to you by your health care provider. Make sure you discuss any questions you have with your health care provider. Document Revised: 08/25/2018 Document Reviewed: 08/25/2018 Elsevier Patient Education  [place]. Therapeutic Phlebotomy Therapeutic phlebotomy is the planned removal of blood from a person's body for the purpose of treating a medical condition. The procedure is similar to donating blood. Usually, about a pint (470 mL, or 0.47 L) of blood is removed. The average adult has 9-12 pints (4.3-5.7 L) of blood in the body. Therapeutic phlebotomy may be used to treat the following medical conditions:  Hemochromatosis. This is a condition in which the blood contains too much iron.  Polycythemia vera. This is a condition in which the blood contains too many red blood cells.  Porphyria cutanea tarda. This is a disease in which an important part of hemoglobin is not made properly. It results in the buildup of abnormal amounts of porphyrins in the body.  Sickle cell disease. This is a condition in which the red blood cells form an abnormal crescent shape rather than a round shape. Tell a health care provider about:  Any allergies you have.  All medicines you are taking, including vitamins, herbs, eye drops, creams, and over-the-counter medicines.  Any problems you or family members have had with anesthetic medicines.  Any blood disorders you have.  Any surgeries you have had.  Any medical conditions you have.  Whether you are pregnant or may be pregnant. What are the risks? Generally, this is a safe procedure. However, problems may occur, including:  Nausea or light-headedness.  Low blood pressure (hypotension).  Soreness, bleeding, swelling, or bruising at the needle insertion site.  Infection. What happens before the procedure?  Follow instructions from your health care provider about eating or  drinking restrictions.  Ask your health  care provider about: ? Changing or stopping your regular medicines. This is especially important if you are taking diabetes medicines or blood thinners (anticoagulants). ? Taking medicines such as aspirin and ibuprofen. These medicines can thin your blood. Do not take these medicines unless your health care provider tells you to take them. ? Taking over-the-counter medicines, vitamins, herbs, and supplements.  Wear clothing with sleeves that can be raised above the elbow.  Plan to have someone take you home from the hospital or clinic.  You may have a blood sample taken.  Your blood pressure, pulse rate, and breathing rate will be measured. What happens during the procedure?   To lower your risk of infection: ? Your health care team will wash or sanitize their hands. ? Your skin will be cleaned with an antiseptic.  You may be given a medicine to numb the area (local anesthetic).  A tourniquet will be placed on your arm.  A needle will be inserted into one of your veins.  Tubing and a collection bag will be attached to that needle.  Blood will flow through the needle and tubing into the collection bag.  The collection bag will be placed lower than your arm to allow gravity to help the flow of blood into the bag.  You may be asked to open and close your hand slowly and continually during the entire collection.  After the specified amount of blood has been removed from your body, the collection bag and tubing will be clamped.  The needle will be removed from your vein.  Pressure will be held on the site of the needle insertion to stop the bleeding.  A bandage (dressing) will be placed over the needle insertion site. The procedure may vary among health care providers and hospitals. What happens after the procedure?  Your blood pressure, pulse rate, and breathing rate will be measured after the procedure.  You will be encouraged to  drink fluids.  Your recovery will be assessed and monitored.  You can return to your normal activities as told by your health care provider. Summary  Therapeutic phlebotomy is the planned removal of blood from a person's body for the purpose of treating a medical condition.  Therapeutic phlebotomy may be used to treat hemochromatosis, polycythemia vera, porphyria cutanea tarda, or sickle cell disease.  In the procedure, a needle is inserted and about a pint (470 mL, or 0.47 L) of blood is removed. The average adult has 9-12 pints (4.3-5.7 L) of blood in the body.  This is generally a safe procedure, but it can sometimes cause problems such as nausea, light-headedness, or low blood pressure (hypotension). This information is not intended to replace advice given to you by your health care provider. Make sure you discuss any questions you have with your health care provider. Document Revised: 01/28/2017 Document Reviewed: 01/28/2017 Elsevier Patient Education  [place] discharged in no apparent distress. Pt left ambulatory with assistance. Pt aware of discharge instructions and verbalized understanding and had no further questions.

## 2020-02-02 NOTE — Telephone Encounter (Signed)
Appointments scheduled calendar printed & mailed per 1/7 los

## 2020-02-05 LAB — FERRITIN: Ferritin: 29 ng/mL (ref 11–307)

## 2020-02-05 LAB — IRON AND TIBC
Iron: 77 ug/dL (ref 41–142)
Saturation Ratios: 20 % — ABNORMAL LOW (ref 21–57)
TIBC: 379 ug/dL (ref 236–444)
UIBC: 302 ug/dL (ref 120–384)

## 2020-03-08 ENCOUNTER — Other Ambulatory Visit: Payer: Self-pay | Admitting: Emergency Medicine

## 2020-03-24 ENCOUNTER — Other Ambulatory Visit: Payer: Self-pay | Admitting: Family

## 2020-03-24 DIAGNOSIS — E7211 Homocystinuria: Secondary | ICD-10-CM

## 2020-04-01 ENCOUNTER — Inpatient Hospital Stay (HOSPITAL_BASED_OUTPATIENT_CLINIC_OR_DEPARTMENT_OTHER): Payer: Medicare Other | Admitting: Family

## 2020-04-01 ENCOUNTER — Inpatient Hospital Stay: Payer: Medicare Other

## 2020-04-01 ENCOUNTER — Inpatient Hospital Stay: Payer: Medicare Other | Attending: Hematology & Oncology

## 2020-04-01 ENCOUNTER — Encounter: Payer: Self-pay | Admitting: Family

## 2020-04-01 ENCOUNTER — Other Ambulatory Visit: Payer: Self-pay

## 2020-04-01 VITALS — BP 124/82 | HR 72 | Temp 98.7°F | Resp 19 | Ht 61.0 in | Wt 134.0 lb

## 2020-04-01 DIAGNOSIS — D45 Polycythemia vera: Secondary | ICD-10-CM | POA: Diagnosis not present

## 2020-04-01 DIAGNOSIS — Z7901 Long term (current) use of anticoagulants: Secondary | ICD-10-CM | POA: Insufficient documentation

## 2020-04-01 DIAGNOSIS — R760 Raised antibody titer: Secondary | ICD-10-CM

## 2020-04-01 DIAGNOSIS — E7211 Homocystinuria: Secondary | ICD-10-CM | POA: Diagnosis not present

## 2020-04-01 DIAGNOSIS — Z86718 Personal history of other venous thrombosis and embolism: Secondary | ICD-10-CM | POA: Insufficient documentation

## 2020-04-01 DIAGNOSIS — I82422 Acute embolism and thrombosis of left iliac vein: Secondary | ICD-10-CM

## 2020-04-01 DIAGNOSIS — I2699 Other pulmonary embolism without acute cor pulmonale: Secondary | ICD-10-CM

## 2020-04-01 DIAGNOSIS — D751 Secondary polycythemia: Secondary | ICD-10-CM

## 2020-04-01 DIAGNOSIS — M810 Age-related osteoporosis without current pathological fracture: Secondary | ICD-10-CM | POA: Diagnosis not present

## 2020-04-01 DIAGNOSIS — E611 Iron deficiency: Secondary | ICD-10-CM | POA: Insufficient documentation

## 2020-04-01 LAB — CMP (CANCER CENTER ONLY)
ALT: 30 U/L (ref 0–44)
AST: 47 U/L — ABNORMAL HIGH (ref 15–41)
Albumin: 4.1 g/dL (ref 3.5–5.0)
Alkaline Phosphatase: 83 U/L (ref 38–126)
Anion gap: 9 (ref 5–15)
BUN: 8 mg/dL (ref 8–23)
CO2: 29 mmol/L (ref 22–32)
Calcium: 9.5 mg/dL (ref 8.9–10.3)
Chloride: 98 mmol/L (ref 98–111)
Creatinine: 0.67 mg/dL (ref 0.44–1.00)
GFR, Estimated: >60 mL/min (ref >60)
Glucose, Bld: 104 mg/dL — ABNORMAL HIGH (ref 70–99)
Potassium: 4 mmol/L (ref 3.5–5.1)
Sodium: 136 mmol/L (ref 135–145)
Total Bilirubin: 1 mg/dL (ref 0.3–1.2)
Total Protein: 7.2 g/dL (ref 6.5–8.1)

## 2020-04-01 LAB — CBC WITH DIFFERENTIAL (CANCER CENTER ONLY)
Abs Immature Granulocytes: 0.03 10*3/uL (ref 0.00–0.07)
Basophils Absolute: 0 10*3/uL (ref 0.0–0.1)
Basophils Relative: 1 %
Eosinophils Absolute: 0.1 10*3/uL (ref 0.0–0.5)
Eosinophils Relative: 2 %
HCT: 42.1 % (ref 36.0–46.0)
Hemoglobin: 13.7 g/dL (ref 12.0–15.0)
Immature Granulocytes: 1 %
Lymphocytes Relative: 14 %
Lymphs Abs: 0.9 10*3/uL (ref 0.7–4.0)
MCH: 29.7 pg (ref 26.0–34.0)
MCHC: 32.5 g/dL (ref 30.0–36.0)
MCV: 91.1 fL (ref 80.0–100.0)
Monocytes Absolute: 0.7 10*3/uL (ref 0.1–1.0)
Monocytes Relative: 11 %
Neutro Abs: 4.5 10*3/uL (ref 1.7–7.7)
Neutrophils Relative %: 71 %
Platelet Count: 164 10*3/uL (ref 150–400)
RBC: 4.62 MIL/uL (ref 3.87–5.11)
RDW: 14.2 % (ref 11.5–15.5)
WBC Count: 6.2 10*3/uL (ref 4.0–10.5)
nRBC: 0 % (ref 0.0–0.2)

## 2020-04-01 NOTE — Progress Notes (Signed)
Hematology and Oncology Follow Up Visit  [name] [id number] 11/08/1945 75 y.o. 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 She is doing fairly well but notes lower back pain.  She has done well on Xarelto with no bleeding. No petechiae. Hct today is 42%.  No fever, chills, n/v, cough, rash, dizziness, palpitations, abdominal pain or changes in bowel (IBS-D) or bladder habits.  She has SOB with asthma. This is unchanged from baseline.  She is doing well on Protonix and feels that her cough due to GERD is better.  No swelling noted in her extremities.  Pedal pulses are 2+.  No fall or syncope to report.  She has maintained a good appetite and is staying well hydrated. Her weight is stable at 134 lbs.   ECOG Performance Status: 2 - Symptomatic, <50% confined to bed  Medications:  Allergies as of 04/01/2020      Reactions   Dulera [mometasone Furo-formoterol Fum] Cough      Medication List       Accurate as of April 01, 2020  1:10 PM. If you have any questions, ask your nurse or doctor.        albuterol 108 (90 Base) MCG/ACT inhaler Commonly known as: VENTOLIN HFA Inhale 2 puffs into the lungs every 4 (four) hours as needed for wheezing or shortness of breath.   budesonide-formoterol 80-4.5 MCG/ACT inhaler Commonly known as: SYMBICORT INHALE 2 PUFFS INTO THE LUNGS TWICE DAILY   dicyclomine 20 MG  tablet Commonly known as: BENTYL Take 1 tablet by mouth as needed.   folic acid 1 MG tablet Commonly known as: FOLVITE TAKE 1 TABLET(1 MG) BY MOUTH DAILY   LORazepam 0.5 MG tablet Commonly known as: ATIVAN TAKE 1 TABLET BY MOUTH EVERY NIGHT AT BEDTIME   metroNIDAZOLE 0.75 % cream Commonly known as: METROCREAM APPLY ON THE FACE BID AS NEEDED FOR FLARES   pantoprazole 40 MG tablet Commonly known as: PROTONIX take 1 tablet by mouth daily before meals   propranolol 20 MG tablet Commonly known as: INDERAL Take 20 mg by mouth daily.   sertraline 100 MG tablet Commonly known as: ZOLOFT Take 100 mg by mouth daily.   Vitamin D (Ergocalciferol) 1.25 MG (50000 UNIT) Caps capsule Commonly known as: DRISDOL Take 1 capsule (50,000 Units total) by mouth every 7 (seven) days. Sundays   Xarelto 20 MG Tabs tablet Generic drug: rivaroxaban TAKE 1 TABLET(20 MG) BY MOUTH DAILY WITH SUPPER       Allergies:  Allergies  Allergen Reactions  . Dulera [Mometasone Furo-Formoterol Fum] Cough    Past Medical History, Surgical history, Social history, and Family History were reviewed and updated.  Review of Systems: All other 10 point review of systems is negative.   Physical Exam:  height is 5\' 1"  (1.549 m) and weight is 134 lb (60.8 kg). Her oral temperature is 98.7 F (37.1 C). Her blood pressure is 124/82 and her pulse is 72. Her respiration is 19 and  oxygen saturation is 97%.   Wt Readings from Last 3 Encounters:  04/01/20 134 lb (60.8 kg)  02/02/20 135 lb 1.3 oz (61.3 kg)  12/04/19 133 lb (60.3 kg)    Ocular: Sclerae unicteric, pupils equal, round and reactive to light Ear-nose-throat: Oropharynx clear, dentition fair Lymphatic: No cervical or supraclavicular adenopathy Lungs no rales or rhonchi, good excursion bilaterally Heart regular rate and rhythm, no murmur appreciated Abd soft, nontender, positive bowel sounds MSK no focal spinal tenderness, no joint edema Neuro:  non-focal, well-oriented, appropriate affect Breasts: Deferred   Lab Results  Component Value Date   WBC 6.2 04/01/2020   HGB 13.7 04/01/2020   HCT 42.1 04/01/2020   MCV 91.1 04/01/2020   PLT 164 04/01/2020   Lab Results  Component Value Date   FERRITIN 29 02/02/2020   IRON 77 02/02/2020   TIBC 379 02/02/2020   UIBC 302 02/02/2020   IRONPCTSAT 20 (L) 02/02/2020   Lab Results  Component Value Date   RETICCTPCT 1.5 06/12/2014   RBC 4.62 04/01/2020   RETICCTABS 65.9 06/12/2014   Lab Results  Component Value Date   KAPLAMBRATIO 0.91 04/27/2016   Lab Results  Component Value Date   IGGSERUM 897 04/27/2016   IGA 339 02/27/2014   IGMSERUM 132 04/27/2016   Lab Results  Component Value Date   MSPIKE Not Observed 04/27/2016     Chemistry      Component Value Date/Time   NA 136 04/01/2020 1118   NA 144 01/20/2017 1344   NA 138 01/17/2016 1356   K 4.0 04/01/2020 1118   K 3.6 01/20/2017 1344   K 4.1 01/17/2016 1356   CL 98 04/01/2020 1118   CL 107 01/20/2017 1344   CO2 29 04/01/2020 1118   CO2 25 01/20/2017 1344   CO2 25 01/17/2016 1356   BUN 8 04/01/2020 1118   BUN 12 01/20/2017 1344   BUN 12.3 01/17/2016 1356   CREATININE 0.67 04/01/2020 1118   CREATININE 0.7 01/20/2017 1344   CREATININE 0.8 01/17/2016 1356      Component Value Date/Time   CALCIUM 9.5 04/01/2020 1118   CALCIUM 8.9 01/20/2017 1344   CALCIUM 9.1 01/17/2016 1356   ALKPHOS 83 04/01/2020 1118   ALKPHOS 68 01/20/2017 1344   ALKPHOS 87 01/17/2016 1356   AST 47 (H) 04/01/2020 1118   AST 48 (H) 01/17/2016 1356   ALT 30 04/01/2020 1118   ALT 49 (H) 01/20/2017 1344   ALT 38 01/17/2016 1356   BILITOT 1.0 04/01/2020 1118   BILITOT 0.67 01/17/2016 1356       Impression and Plan: [name] is a very pleasant 75yo caucasian female withpolycythemia, 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She was diagnosed with extensive left lower extremity DVT and small right  PEin February 2021(resolved on CT angio and [place] in June 2021). She had a mildly elevated homocystine level and positive lupus anticoagulant. No phlebotomy needed for Hct 42%.  Zometa due again in July 2022.  Follow-up in 8 weeks.  She was encouraged to contact our office with any questions or concerns.   Faith Peace, NP 3/7/20221:10 PM

## 2020-04-02 ENCOUNTER — Telehealth: Payer: Self-pay | Admitting: Hematology & Oncology

## 2020-04-02 LAB — IRON AND TIBC
Iron: 83 ug/dL (ref 41–142)
Saturation Ratios: 20 % — ABNORMAL LOW (ref 21–57)
TIBC: 406 ug/dL (ref 236–444)
UIBC: 323 ug/dL (ref 120–384)

## 2020-04-02 LAB — HOMOCYSTEINE: Homocysteine: 14.1 umol/L (ref 0.0–19.2)

## 2020-04-02 LAB — FERRITIN: Ferritin: 28 ng/mL (ref 11–307)

## 2020-04-02 NOTE — Telephone Encounter (Signed)
Appointments scheduled per 3/7 los letter/calendar mailed  

## 2020-04-03 LAB — LUPUS ANTICOAGULANT PANEL
DRVVT: 53.8 s — ABNORMAL HIGH (ref 0.0–47.0)
PTT Lupus Anticoagulant: 34.3 s (ref 0.0–51.9)

## 2020-04-03 LAB — DRVVT MIX: dRVVT Mix: 42 s — ABNORMAL HIGH (ref 0.0–40.4)

## 2020-04-03 LAB — DRVVT CONFIRM: dRVVT Confirm: 1.3 ratio — ABNORMAL HIGH (ref 0.8–1.2)

## 2020-04-16 ENCOUNTER — Encounter (HOSPITAL_BASED_OUTPATIENT_CLINIC_OR_DEPARTMENT_OTHER): Payer: Self-pay | Admitting: *Deleted

## 2020-04-16 ENCOUNTER — Emergency Department (HOSPITAL_BASED_OUTPATIENT_CLINIC_OR_DEPARTMENT_OTHER)
Admission: EM | Admit: 2020-04-16 | Discharge: 2020-04-16 | Disposition: A | Discharge status: Home / Self Care | Payer: Medicare Other | Attending: Emergency Medicine | Admitting: Emergency Medicine

## 2020-04-16 ENCOUNTER — Emergency Department (HOSPITAL_BASED_OUTPATIENT_CLINIC_OR_DEPARTMENT_OTHER): Payer: Medicare Other

## 2020-04-16 ENCOUNTER — Other Ambulatory Visit: Payer: Self-pay

## 2020-04-16 DIAGNOSIS — K529 Noninfective gastroenteritis and colitis, unspecified: Secondary | ICD-10-CM | POA: Diagnosis not present

## 2020-04-16 DIAGNOSIS — K8020 Calculus of gallbladder without cholecystitis without obstruction: Secondary | ICD-10-CM | POA: Diagnosis not present

## 2020-04-16 DIAGNOSIS — Z7951 Long term (current) use of inhaled steroids: Secondary | ICD-10-CM | POA: Insufficient documentation

## 2020-04-16 DIAGNOSIS — W01198A Fall on same level from slipping, tripping and stumbling with subsequent striking against other object, initial encounter: Secondary | ICD-10-CM | POA: Diagnosis not present

## 2020-04-16 DIAGNOSIS — M546 Pain in thoracic spine: Secondary | ICD-10-CM | POA: Diagnosis not present

## 2020-04-16 DIAGNOSIS — J45909 Unspecified asthma, uncomplicated: Secondary | ICD-10-CM | POA: Diagnosis not present

## 2020-04-16 DIAGNOSIS — J984 Other disorders of lung: Secondary | ICD-10-CM | POA: Diagnosis not present

## 2020-04-16 DIAGNOSIS — R0602 Shortness of breath: Secondary | ICD-10-CM | POA: Insufficient documentation

## 2020-04-16 DIAGNOSIS — S0990XA Unspecified injury of head, initial encounter: Secondary | ICD-10-CM | POA: Diagnosis not present

## 2020-04-16 DIAGNOSIS — Z7901 Long term (current) use of anticoagulants: Secondary | ICD-10-CM | POA: Insufficient documentation

## 2020-04-16 DIAGNOSIS — M5386 Other specified dorsopathies, lumbar region: Secondary | ICD-10-CM | POA: Diagnosis not present

## 2020-04-16 DIAGNOSIS — S34109A Unspecified injury to unspecified level of lumbar spinal cord, initial encounter: Secondary | ICD-10-CM | POA: Diagnosis present

## 2020-04-16 DIAGNOSIS — I517 Cardiomegaly: Secondary | ICD-10-CM | POA: Diagnosis not present

## 2020-04-16 DIAGNOSIS — R9431 Abnormal electrocardiogram [ECG] [EKG]: Secondary | ICD-10-CM | POA: Diagnosis not present

## 2020-04-16 DIAGNOSIS — S32020A Wedge compression fracture of second lumbar vertebra, initial encounter for closed fracture: Secondary | ICD-10-CM | POA: Diagnosis not present

## 2020-04-16 DIAGNOSIS — N189 Chronic kidney disease, unspecified: Secondary | ICD-10-CM | POA: Insufficient documentation

## 2020-04-16 DIAGNOSIS — N3289 Other specified disorders of bladder: Secondary | ICD-10-CM | POA: Diagnosis not present

## 2020-04-16 DIAGNOSIS — J929 Pleural plaque without asbestos: Secondary | ICD-10-CM | POA: Diagnosis not present

## 2020-04-16 DIAGNOSIS — K7460 Unspecified cirrhosis of liver: Secondary | ICD-10-CM | POA: Diagnosis not present

## 2020-04-16 DIAGNOSIS — W19XXXA Unspecified fall, initial encounter: Secondary | ICD-10-CM

## 2020-04-16 LAB — COMPREHENSIVE METABOLIC PANEL
ALT: 30 U/L (ref 0–44)
AST: 48 U/L — ABNORMAL HIGH (ref 15–41)
Albumin: 3.5 g/dL (ref 3.5–5.0)
Alkaline Phosphatase: 82 U/L (ref 38–126)
Anion gap: 10 (ref 5–15)
BUN: 11 mg/dL (ref 8–23)
CO2: 28 mmol/L (ref 22–32)
Calcium: 8.8 mg/dL — ABNORMAL LOW (ref 8.9–10.3)
Chloride: 97 mmol/L — ABNORMAL LOW (ref 98–111)
Creatinine, Ser: 0.57 mg/dL (ref 0.44–1.00)
GFR, Estimated: >60 mL/min (ref >60)
Glucose, Bld: 91 mg/dL (ref 70–99)
Potassium: 4.6 mmol/L (ref 3.5–5.1)
Sodium: 135 mmol/L (ref 135–145)
Total Bilirubin: 0.7 mg/dL (ref 0.3–1.2)
Total Protein: 7.2 g/dL (ref 6.5–8.1)

## 2020-04-16 LAB — URINALYSIS, ROUTINE W REFLEX MICROSCOPIC
Bilirubin Urine: NEGATIVE
Glucose, UA: NEGATIVE mg/dL
Hgb urine dipstick: NEGATIVE
Ketones, ur: NEGATIVE mg/dL
Leukocytes,Ua: NEGATIVE
Nitrite: NEGATIVE
Protein, ur: NEGATIVE mg/dL
Specific Gravity, Urine: 1.01 (ref 1.005–1.030)
pH: 6 (ref 5.0–8.0)

## 2020-04-16 LAB — MAGNESIUM: Magnesium: 1.7 mg/dL (ref 1.7–2.4)

## 2020-04-16 LAB — CBC WITH DIFFERENTIAL/PLATELET
Abs Immature Granulocytes: 0.05 10*3/uL (ref 0.00–0.07)
Basophils Absolute: 0 10*3/uL (ref 0.0–0.1)
Basophils Relative: 1 %
Eosinophils Absolute: 0.1 10*3/uL (ref 0.0–0.5)
Eosinophils Relative: 2 %
HCT: 41.7 % (ref 36.0–46.0)
Hemoglobin: 13.6 g/dL (ref 12.0–15.0)
Immature Granulocytes: 1 %
Lymphocytes Relative: 13 %
Lymphs Abs: 1.1 10*3/uL (ref 0.7–4.0)
MCH: 30 pg (ref 26.0–34.0)
MCHC: 32.6 g/dL (ref 30.0–36.0)
MCV: 91.9 fL (ref 80.0–100.0)
Monocytes Absolute: 0.9 10*3/uL (ref 0.1–1.0)
Monocytes Relative: 11 %
Neutro Abs: 6.2 10*3/uL (ref 1.7–7.7)
Neutrophils Relative %: 72 %
Platelets: 175 10*3/uL (ref 150–400)
RBC: 4.54 MIL/uL (ref 3.87–5.11)
RDW: 14.1 % (ref 11.5–15.5)
WBC: 8.4 10*3/uL (ref 4.0–10.5)
nRBC: 0 % (ref 0.0–0.2)

## 2020-04-16 LAB — TROPONIN I (HIGH SENSITIVITY): Troponin I (High Sensitivity): 3 ng/L (ref <18)

## 2020-04-16 MED ORDER — IOHEXOL 300 MG/ML  SOLN
100.0000 mL | Freq: Once | INTRAMUSCULAR | Status: AC | PRN
  Administered 2020-04-16: 100 mL via INTRAVENOUS

## 2020-04-16 NOTE — ED Notes (Signed)
Patient transported to CT 

## 2020-04-16 NOTE — ED Triage Notes (Signed)
She stood up and passed out over a week ago. States this is not unusual for her due to muscle disease. She fell and hit her head. She had bleeding from a head laceration. She takes blood thinners. She never saw a doctor after the fall. She had back pain 2 days later. Hx of broken back with surgery. She is here today with pain in her back. She is alert oriented.

## 2020-04-16 NOTE — Discharge Instructions (Addendum)
You had a CT scan performed in the Emergency Department today that showed a recent compression fracture in your back at L2.  Your CT scan also showed changes to your bladder that can be seen with urinary tract infections.  Please follow up with your family doctor for recheck.

## 2020-04-16 NOTE — ED Notes (Signed)
Pt to RR in Wheelchair.

## 2020-04-16 NOTE — ED Provider Notes (Signed)
[facility] EMERGENCY DEPARTMENT Provider Note   CSN: [id number] Arrival date & time: 04/16/20  1026     History Chief Complaint  Patient presents with  . Fall  . Head Injury    [name] is a 75 y.o. female.  The history is provided by the patient, the spouse and medical records.  Fall  Head Injury  [name] is a 75 y.o. female who presents to the Emergency Department complaining of fall. She presents the emergency department accompanied by her husband for evaluation of injuries following a fall that occurred a week and a half ago. The fall occurred when she was trying to get up the couch. She states that it took a couple of tries to get off the couch and then she fell, think she might've lost consciousness. She did strike her head. She did have some bleeding at that time. She did not seek evaluation. Overall her head discomfort has resolved. Two days after the fall she developed thoracic back pain located over the lower thoracic region. She describes it as a discomfort sensation. She has chronic shortness of breath and this is her baseline. She also has chronic generalized weakness due to mitochondrial disorder that is at its baseline. She denies any fevers, vomiting. She does have chronic diarrhea. She is anticoagulated on Xarelto. She has a history of prior spine fracture and states that this pain is not as severe as prior fractures. She has not take anything at home for the pain.    Past Medical History:  Diagnosis Date  . Allergy   . Anemia   . Anxiety   . Asthma    DR. BYRUM  . Blood transfusion without reported diagnosis   . Cataract   . Cholelithiasis   . Chronic kidney disease    gallstones   . Compression fracture of body of thoracic vertebra (HCC)   . Depression   . Dyspnea   . Dysrhythmia    palpitations  . GERD (gastroesophageal reflux disease)   . Heart attack ([place])   . Hemorrhoids   . IBS (irritable bowel syndrome)   .  Iliotibial band syndrome    left knee  . Migraine   . Mitochondrial myopathy   . Normal coronary arteries    by cardiac catheterization performed by myself 02/25/01  . Osteoporosis   . Pericarditis    age 1  . Pneumothorax   . 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 PVC's (premature ventricular contractions)   . Vitamin D deficiency     Patient Active Problem List   Diagnosis Date Noted  . Pulmonary embolism ([place]) 05/24/2019  . SDH (subdural hematoma) ([place]) 02/28/2017  . Osteoporosis with fracture 12/03/2016  . 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 Age-related osteoporosis without current pathological fracture 07/08/2016  . IDA (iron deficiency anemia) 10/30/2015  . Need for prophylactic vaccination and inoculation against influenza 10/25/2015  . Laryngopharyngeal reflux (LPR) 09/03/2015  . Cough 02/21/2015  . 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 Iron deficiency anemia 10/12/2013  . DOE (dyspnea on exertion) 01/30/2013  . Polycythemia vera ([place]) 06/17/2012  . 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 History of atypical migraine 03/03/2012  . Nuclear cataract 03/03/2012  . Posterior vitreous detachment 03/03/2012  . Low back pain 06/03/2011  . Back pain 06/03/2011  . Chest pain 09/28/2010  . Personal history of endocrine/metabolic/immunity disorder 04/04/2010  . Encounter for long-term (current) use of medications 04/04/2010  . METATARSALGIA 03/18/2010  . IDIOPATHIC OSTEOPOROSIS 03/18/2010  . INSOMNIA UNSPECIFIED  08/13/2009  . Anxiety state 11/29/2008  . ILIOTIBIAL BAND SYNDROME, LEFT KNEE 08/23/2008  . MIGRAINE HEADACHE 07/28/2007  . UNSPECIFIED MYOPATHY 07/28/2007  . 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 TREMOR, ESSENTIAL/FAMILIAL 03/25/2006  . COPD (chronic obstructive pulmonary disease) ([place]) 03/25/2006  . IRRITABLE BOWEL SYNDROME 03/25/2006  . MENOPAUSAL SYNDROME 03/25/2006    Past Surgical History:  Procedure Laterality Date  . APPENDECTOMY    . CARDIAC CATHETERIZATION    .  CATARACT EXTRACTION W/ INTRAOCULAR LENS  IMPLANT, BILATERAL    . CESAREAN SECTION     x 1  . COLONOSCOPY    . DILATATION & CURETTAGE/HYSTEROSCOPY WITH MYOSURE N/A 10/29/2014   Procedure: DILATATION & CURETTAGE/HYSTEROSCOPY WITH MYOSURE;  Surgeon: Paula Compton, MD;  Location: [facility];  Service: Gynecology;  Laterality: N/A;  . IR FLUORO GUIDED NEEDLE PLC ASPIRATION/INJECTION LOC  06/10/2016  . IR FLUORO GUIDED NEEDLE PLC ASPIRATION/INJECTION LOC  06/10/2016  . KYPHOPLASTY N/A 05/14/2016   Procedure: T 11 KYPHOPLASTY;  Surgeon: Phylliss Bob, MD;  Location: [facility];  Service: Orthopedics;  Laterality: N/A;  T 11 KYPHOPLASTY  . KYPHOPLASTY N/A 06/18/2016   Procedure: THORACIC 12 KYPHOPLASTY;  Surgeon: Phylliss Bob, MD;  Location: [facility];  Service: Orthopedics;  Laterality: N/A;  THORACIC 12 KYPHOPLASTY; REQUEST 1 HOUR AND FLIP ROOM  . MOUTH SURGERY    . NASAL SINUS SURGERY    . NM MYOCAR PERF WALL MOTION  04/27/2006   normal  . US ECHOCARDIOGRAPHY  12/25/2010   normal  . VARICOSE VEIN SURGERY       OB History   No obstetric history on file.     Family History  Problem Relation Age of Onset  . Asthma Mother   . Arthritis Mother   . Depression Mother   . Emphysema Father   . Stroke Father   . Alcohol abuse Father   . Heart attack Father   . Lung cancer Father   . Colon cancer Neg Hx   . Esophageal cancer Neg Hx   . Stomach cancer Neg Hx   . Pancreatic cancer Neg Hx     Social History   Tobacco Use  . Smoking status: Never Smoker  . Smokeless tobacco: Never Used  . Tobacco comment: never used tobacco  Vaping Use  . Vaping Use: Never used  Substance Use Topics  . Alcohol use: Yes    Alcohol/week: 2.0 standard drinks    Types: 2 Glasses of wine per week    Comment: occasionally  . Drug use: No    Home Medications Prior to Admission medications   Medication Sig Start Date End Date Taking? Authorizing Provider  albuterol (VENTOLIN HFA) 108 (90 Base) MCG/ACT inhaler  Inhale 2 puffs into the lungs every 4 (four) hours as needed for wheezing or shortness of breath. 04/04/19   Collene Gobble, MD  budesonide-formoterol (SYMBICORT) 80-4.5 MCG/ACT inhaler INHALE 2 PUFFS INTO THE LUNGS TWICE DAILY 03/08/20   Collene Gobble, MD  dicyclomine (BENTYL) 20 MG tablet Take 1 tablet by mouth as needed. 05/18/18   [provider]  folic acid (FOLVITE) 1 MG tablet TAKE 1 TABLET(1 MG) BY MOUTH DAILY 03/24/20   [name], MD  LORazepam (ATIVAN) 0.5 MG tablet TAKE 1 TABLET BY MOUTH EVERY NIGHT AT BEDTIME 05/01/14   Leandrew Koyanagi, MD  metroNIDAZOLE (METROCREAM) 0.75 % cream APPLY ON THE FACE BID AS NEEDED FOR FLARES 02/21/18   [provider]  pantoprazole (PROTONIX) 40 MG tablet take 1  tablet by mouth daily before meals 10/13/16   [provider]  propranolol (INDERAL) 20 MG tablet Take 20 mg by mouth daily. 01/04/19   [provider]  sertraline (ZOLOFT) 100 MG tablet Take 100 mg by mouth daily. 05/24/17   [provider]  Vitamin D, Ergocalciferol, (DRISDOL) 50000 units CAPS capsule Take 1 capsule (50,000 Units total) by mouth every 7 (seven) days. Sundays 07/08/16   [name], MD  XARELTO 20 MG TABS tablet TAKE 1 TABLET(20 MG) BY MOUTH DAILY WITH SUPPER 01/24/20   [place], Holli Humbles, NP    Allergies    Ruthe Mannan [mometasone furo-formoterol fum]  Review of Systems   Review of Systems  All other systems reviewed and are negative.   Physical Exam Updated Vital Signs BP (!) 142/67 (BP Location: Right Arm)   Pulse 72   Temp 97.8 F (36.6 C) (Oral)   Resp 18   Ht 5\' 1"  (1.549 m)   Wt 60.8 kg   SpO2 98%   BMI 25.33 kg/m   Physical Exam Vitals and nursing note reviewed.  Constitutional:      Appearance: She is well-developed.  HENT:     Head: Normocephalic and atraumatic.  Cardiovascular:     Rate and Rhythm: Normal rate and regular rhythm.     Heart sounds: No murmur heard.   Pulmonary:     Effort:  Pulmonary effort is normal. No respiratory distress.     Breath sounds: Normal breath sounds.  Abdominal:     Palpations: Abdomen is soft.     Tenderness: There is no abdominal tenderness. There is no guarding or rebound.  Musculoskeletal:        General: No tenderness.     Comments: 2+ DP pulses bilaterally.  TTP over lower thoracic back  Skin:    General: Skin is warm and dry.  Neurological:     Mental Status: She is alert and oriented to person, place, and time.     Comments: Mild generalized weakness  Psychiatric:        Behavior: Behavior normal.     ED Results / Procedures / Treatments   Labs (all labs ordered are listed, but only abnormal results are displayed) Labs Reviewed  COMPREHENSIVE METABOLIC PANEL - Abnormal; Notable for the following components:      Result Value   Chloride 97 (*)    Calcium 8.8 (*)    AST 48 (*)    All other components within normal limits  URINE CULTURE  CBC WITH DIFFERENTIAL/PLATELET  MAGNESIUM  URINALYSIS, ROUTINE W REFLEX MICROSCOPIC  TROPONIN I (HIGH SENSITIVITY)    EKG EKG Interpretation  Date/Time:  Tuesday April 16 2020 10:53:16 EDT Ventricular Rate:  76 PR Interval:    QRS Duration: 87 QT Interval:  495 QTC Calculation: 557 R Axis:   73 Text Interpretation: Sinus rhythm RSR' in V1 or V2, right VCD or RVH Abnrm T, consider ischemia, anterolateral lds Prolonged QT interval deep TWI present on prior EKGs in 2018 Confirmed by Quintella Reichert [phone]) on 04/16/2020 10:56:03 AM   Radiology DG Chest 2 View  Result Date: 04/16/2020 CLINICAL DATA:  Thoracic spine pain since fall 2 weeks ago. EXAM: CHEST - 2 VIEW COMPARISON:  CT chest July 06, 2019 and chest radiograph July 12, 2017. FINDINGS: Mild cardiac enlargement, similar prior. Mediastinal contours are unremarkable. Right middle lobe scarring. No new focal consolidation. Similar bibasilar pleural thickening. No pneumothorax. Similar mild anterior wedging of the T9 vertebral body  with prior augmentation at T11 and T12. Unchanged T2 compression deformity. IMPRESSION: 1. No acute cardiopulmonary abnormality. 2. Right middle lobe scarring. 3. Similar appearance of the compression deformities and vertebral augmentation in the thoracolumbar spine. Electronically Signed   By: Dahlia Bailiff MD   On: 04/16/2020 12:11   DG Thoracic Spine 2 View  Addendum Date: 04/16/2020   ADDENDUM REPORT: 04/16/2020 12:10 ADDENDUM: Chronic compression deformity at L2 also appears similar to prior. Electronically Signed   By: Dahlia Bailiff MD   On: 04/16/2020 12:10   Result Date: 04/16/2020 CLINICAL DATA:  Thoracic spine pain after fall 2 weeks ago. EXAM: THORACIC SPINE 2 VIEWS COMPARISON:  CT chest July 06, 2019 and chest radiograph July 12, 2017 FINDINGS: Again seen is previous spinal augmentation at T11 and T12. Mild T9 anterior compression deformity with approximately 20% height loss, this appears stable since July 12, 2017. No acute abnormalities in the thoracic spine. Alignment is normal. No other significant bone abnormalities are identified. IMPRESSION: Prior spinal augmentation at T11 and T12 with unchanged mild T9 anterior compression deformity stable since at least July 12, 2017. No acute abnormality identified in the thoracic spine. Electronically Signed: By: Dahlia Bailiff MD On: 04/16/2020 12:06   CT Abdomen Pelvis W Contrast  Result Date: 04/16/2020 CLINICAL DATA:  Nonlocalized abdominal and back pain. Fall. On blood thinners. Fall 2 weeks ago. EXAM: CT ABDOMEN AND PELVIS WITH CONTRAST TECHNIQUE: Multidetector CT imaging of the abdomen and pelvis was performed using the standard protocol following bolus administration of intravenous contrast. CONTRAST:  192mL OMNIPAQUE IOHEXOL 300 MG/ML  SOLN COMPARISON:  04/27/2016 renal ultrasound. 09/06/2015 abdominal ultrasound. FINDINGS: Lower chest: Bibasilar scarring. Normal heart size without pericardial or pleural effusion. Hepatobiliary: Advanced  cirrhosis, as evidenced by irregular hepatic capsule, caudate lobe enlargement. No focal liver lesion. Multiple small gallstones without acute cholecystitis or biliary duct dilatation. Pancreas: Normal, without mass or ductal dilatation. Spleen: Normal in size, without focal abnormality. Adrenals/Urinary Tract: Normal adrenal glands. Mild renal cortical thinning bilaterally. Interpolar left renal too small to characterize lesion. Normal right kidney. The bladder wall is mildly thickened and irregular including on 59/2. Stomach/Bowel: Normal stomach, without wall thickening. Normal colon and terminal ileum. Appendix not visualized. Normal small bowel. Vascular/Lymphatic: Patent hepatic portal, splenic veins. Aortic atherosclerosis. No abdominopelvic adenopathy. Reproductive: Normal uterus and adnexa. Other: No significant free fluid.  Mild pelvic floor laxity. Musculoskeletal: Osteopenia. Pelvic sclerotic lesions are likely bone islands. Vertebral augmentation at T11 and T12. Moderate compression deformity at L2 with mild ventral canal encroachment. Favored to be subacute. No paravertebral hematoma. IMPRESSION: 1. L2 compression deformity with mild ventral canal encroachment, favored to be subacute. 2. Advanced cirrhosis. 3. Cholelithiasis. 4. Bladder wall thickening and irregularity, suspicious for cystitis. Correlate with urinalysis. 5. Aortic Atherosclerosis (ICD10-I70.0). Electronically Signed   By: Abigail Miyamoto M.D.   On: 04/16/2020 13:57    Procedures Procedures   Medications Ordered in ED Medications  iohexol (OMNIPAQUE) 300 MG/ML solution 100 mL (100 mLs Intravenous Contrast Given 04/16/20 1310)    ED Course  I have reviewed the triage vital signs and the nursing notes.  Pertinent labs & imaging results that were available during my care of the patient were reviewed by me and considered in my medical decision making (see chart for details).    MDM Rules/Calculators/A&P                          patient here for  evaluation of thoracic back pain following a fall a week and a half ago. She has tenderness to palpation over the lower thoracic spine. She is neurologically intact on evaluation. Imaging is significant for subacute L2 compression fracture, this is not definitively in the region of tenderness on examination. Patient did strike her head in the fall but has been doing well since the head injury, do not feel that she needs CT head at this time as subdural hematoma seems unlikely. Patient declines pain medications in the emergency department. Discussed Tylenol available over-the-counter as needed. CT scan demonstrates changes to her bladder concerning for cystitis. She has no active urinary symptoms and declines to wait in the emergency department for urinalysis. Will send a culture and treat if positive. Discussed PCP follow-up as well as return precautions.  Final Clinical Impression(s) / ED Diagnoses Final diagnoses:  Acute midline thoracic back pain  Fall, initial encounter  Closed wedge compression fracture of L2 vertebra, initial encounter [facility])    Rx / DC Orders ED Discharge Orders    None       Quintella Reichert, MD 04/16/20 1441

## 2020-04-16 NOTE — ED Notes (Signed)
Spoke with Maggie in lab to run Urinalysis and urine culture

## 2020-04-17 LAB — URINE CULTURE

## 2020-04-18 DIAGNOSIS — M546 Pain in thoracic spine: Secondary | ICD-10-CM | POA: Diagnosis not present

## 2020-04-25 ENCOUNTER — Telehealth: Payer: Self-pay | Admitting: Gastroenterology

## 2020-04-25 NOTE — Telephone Encounter (Signed)
Pt is requesting a call back from a nurse to further discuss her gallstones, pt would like to know what she needs to do.

## 2020-04-25 NOTE — Telephone Encounter (Signed)
The pt had a CT scan done for a fall 3/22 by an "ED" doc.  She was told she has gallstones and wants to know what she should do.  She has not been seen since 2019 by Dr Silverio Decamp.  She has been scheduled an appt to discuss on 06/17/20.

## 2020-04-26 ENCOUNTER — Other Ambulatory Visit: Payer: Self-pay | Admitting: Internal Medicine

## 2020-04-26 DIAGNOSIS — R1011 Right upper quadrant pain: Secondary | ICD-10-CM

## 2020-04-29 ENCOUNTER — Other Ambulatory Visit: Payer: Self-pay

## 2020-04-29 ENCOUNTER — Ambulatory Visit (INDEPENDENT_AMBULATORY_CARE_PROVIDER_SITE_OTHER): Payer: Medicare Other | Admitting: Emergency Medicine

## 2020-04-29 ENCOUNTER — Encounter: Payer: Self-pay | Admitting: Emergency Medicine

## 2020-04-29 DIAGNOSIS — K219 Gastro-esophageal reflux disease without esophagitis: Secondary | ICD-10-CM | POA: Diagnosis not present

## 2020-04-29 DIAGNOSIS — I2699 Other pulmonary embolism without acute cor pulmonale: Secondary | ICD-10-CM

## 2020-04-29 DIAGNOSIS — R059 Cough, unspecified: Secondary | ICD-10-CM | POA: Diagnosis not present

## 2020-04-29 DIAGNOSIS — J449 Chronic obstructive pulmonary disease, unspecified: Secondary | ICD-10-CM | POA: Diagnosis not present

## 2020-04-29 MED ORDER — STIOLTO RESPIMAT 2.5-2.5 MCG/ACT IN AERS
2.0000 | INHALATION_SPRAY | Freq: Every day | RESPIRATORY_TRACT | 0 refills | Status: DC
Start: 2020-04-29 — End: 2020-05-13

## 2020-04-29 NOTE — Assessment & Plan Note (Signed)
Pantoprazole twice daily. 

## 2020-04-29 NOTE — Progress Notes (Signed)
Subjective:    Patient ID: [name], female    DOB: 08/04/1945, 75 y.o.   MRN: [id number]  HPI  ROV 09/07/19 --follow-up visit for 75 year old woman with severe COPD with a bronchodilator response, chronic cough in the setting of this as well as allergic rhinitis and GERD. She also has a history of polycythemia vera, hemochromatosis, recent diagnosis of DVT and bilateral PE. She reports today that her breathing has been stable. She can ambulate with a cane. She is sometimes limited by her breathing - she had difficulty getting in from the parking lot. She is on Symbicort, is worried about the cost. She is on protonix, was originally started due to her cough. Cough is on-productive. She has PND but does not want to be on any other meds to address this..   A repeat CT chest 07/06/19 reviewed by me shows no residual evidence of pulmonary embolism, no infiltrates.  ROV 04/29/20 --[name] is 13 and has a history of severe COPD with a bronchodilator response, polycythemia vera with hemochromatosis, DVT and bilateral pulmonary embolism diagnosed approximately 1 year ago.  Also with a history of chronic cough in the setting of GERD.  Her hypercoagulable work-up has revealed a mildly elevated homocystine and a positive lupus anticoagulant. Remains on anticoagulation - they are considering whether to discontinue, working with H/O.  She follows up today reporting that she has been experiencing more exertional SOB, has had trouble with stairs. Also with back pain, limiting her activity. She is SOB with almost any activity. Also notices dyspnea with swallowing. Her cough did decrease when her PPI was in increased by [name].  We have been managing her on Symbicort, pantoprazole increased to bid.   She has albuterol which she uses approximately 0-1x a day.  No flares since last visit, no hospitalizations, antibiotics or prednisone.  She is interested in a script for a chair lift for her home.   CT  abdomen and pelvis done 04/16/2020 reviewed by me was performed after a fall.  This showed an L2 compression deformity, hepatic changes consistent with cirrhosis, cholelithiasis, bladder wall thickening  MDM: I reviewed oncology hematology notes from 04/01/2020 Reviewed ENT note from 11/27/2019  Review of Systems  Constitutional: Negative for fever and unexpected weight change.  HENT: Positive for congestion, postnasal drip and sinus pressure. Negative for dental problem, ear pain, nosebleeds, rhinorrhea, sneezing, sore throat and trouble swallowing.   Eyes: Negative for redness and itching.  Respiratory: Positive for cough and shortness of breath. Negative for chest tightness and wheezing.   Cardiovascular: Negative for palpitations and leg swelling.  Gastrointestinal: Negative for nausea and vomiting.  Genitourinary: Negative for dysuria.  Musculoskeletal: Negative for joint swelling.  Skin: Negative for rash.  Neurological: Negative for headaches.  Hematological: Does not bruise/bleed easily.  Psychiatric/Behavioral: Negative for dysphoric mood. The patient is not nervous/anxious.         Objective:   Physical Exam Vitals:   04/29/20 1350  BP: 124/74  Pulse: 73  Temp: 98 F (36.7 C)  TempSrc: Temporal  SpO2: 96%  Weight: 136 lb 3.2 oz (61.8 kg)  Height: 5\' 3"  (1.6 m)   Gen: Pleasant, well-nourished, in no distress,  normal affect, clearing her throat frequently  ENT: No lesions,  mouth clear,  oropharynx clear, no postnasal drip  Neck: No JVD, no stridor  Lungs: No use of accessory muscles, clear without rales or rhonchi, no wheeze  Cardiovascular: RRR, heart sounds normal, no murmur or  gallops, no peripheral edema  Musculoskeletal: No deformities, no cyanosis or clubbing  Neuro: alert, non focal  Skin: Warm, no lesions or rashes       Assessment & Plan:  Pulmonary embolism (HCC) She has been on anticoagulation for just over a year.  Lupus anticoagulant  positive, following with hematology.  They will have to decide how long she will stay on anticoagulation.  She does not seem to have had any complications.  COPD (chronic obstructive pulmonary disease) (HCC) We will and LAMA today.  Plan to change her Symbicort to [facility] and see if she gets benefit.  Keep her albuterol available.  No flares.  Laryngopharyngeal reflux (LPR) Pantoprazole twice daily  Cough Chronic cough, definitely better since she increased her PPI.  She may also need better treatment of rhinitis.  She has been hesitant to add new medicines but discussed antihistamine with her if she had progression of her symptoms.  Faith Apo, MD, PhD 04/29/2020, 2:12 PM [facility] [phone] or if no answer [phone]

## 2020-04-29 NOTE — Assessment & Plan Note (Signed)
She has been on anticoagulation for just over a year.  Lupus anticoagulant positive, following with hematology.  They will have to decide how long she will stay on anticoagulation.  She does not seem to have had any complications.

## 2020-04-29 NOTE — Patient Instructions (Signed)
Agree with taking pantoprazole 40 mg twice a day.  Take this medication 1 hour around food. If your cough continues or progresses then you could consider starting an over-the-counter antihistamine like loratadine (generic Claritin) Please stop Symbicort We will do a trial starting Stiolto 2 puffs once a day.  If you benefit from this medication please call us and let us know so that we can send a prescription to your pharmacy. Keep your albuterol available to use 2 puffs when you need it for shortness of breath, chest tightness, wheezing. Follow with hematology to decide how long you will stay on your anticoagulation medication. Follow with Dr Lamonte Sakai in 6 months or sooner if you have any problems

## 2020-04-29 NOTE — Assessment & Plan Note (Signed)
We will and LAMA today.  Plan to change her Symbicort to [facility] and see if she gets benefit.  Keep her albuterol available.  No flares.

## 2020-04-29 NOTE — Assessment & Plan Note (Signed)
Chronic cough, definitely better since she increased her PPI.  She may also need better treatment of rhinitis.  She has been hesitant to add new medicines but discussed antihistamine with her if she had progression of her symptoms.

## 2020-04-29 NOTE — Addendum Note (Signed)
Addended by: Gavin Potters R on: 04/29/2020 02:44 PM   Modules accepted: Orders

## 2020-04-30 ENCOUNTER — Ambulatory Visit
Admission: RE | Admit: 2020-04-30 | Discharge: 2020-04-30 | Disposition: A | Discharge status: Home / Self Care | Payer: Medicare Other | Source: Ambulatory Visit | Attending: Internal Medicine | Admitting: Internal Medicine

## 2020-04-30 DIAGNOSIS — K8020 Calculus of gallbladder without cholecystitis without obstruction: Secondary | ICD-10-CM | POA: Diagnosis not present

## 2020-04-30 DIAGNOSIS — R1011 Right upper quadrant pain: Secondary | ICD-10-CM

## 2020-05-01 ENCOUNTER — Observation Stay (HOSPITAL_COMMUNITY)
Admission: EM | Admit: 2020-05-01 | Discharge: 2020-05-02 | Disposition: A | Discharge status: Home / Self Care | Payer: Medicare Other | Attending: Family Medicine | Admitting: Family Medicine

## 2020-05-01 ENCOUNTER — Other Ambulatory Visit: Payer: Self-pay

## 2020-05-01 ENCOUNTER — Encounter (HOSPITAL_COMMUNITY): Payer: Self-pay

## 2020-05-01 ENCOUNTER — Observation Stay (HOSPITAL_COMMUNITY): Payer: Medicare Other

## 2020-05-01 DIAGNOSIS — R1011 Right upper quadrant pain: Secondary | ICD-10-CM | POA: Diagnosis not present

## 2020-05-01 DIAGNOSIS — R9431 Abnormal electrocardiogram [ECG] [EKG]: Secondary | ICD-10-CM | POA: Diagnosis not present

## 2020-05-01 DIAGNOSIS — Z86711 Personal history of pulmonary embolism: Secondary | ICD-10-CM | POA: Diagnosis present

## 2020-05-01 DIAGNOSIS — Z79899 Other long term (current) drug therapy: Secondary | ICD-10-CM | POA: Insufficient documentation

## 2020-05-01 DIAGNOSIS — Z23 Encounter for immunization: Secondary | ICD-10-CM | POA: Diagnosis not present

## 2020-05-01 DIAGNOSIS — I2699 Other pulmonary embolism without acute cor pulmonale: Secondary | ICD-10-CM | POA: Diagnosis not present

## 2020-05-01 DIAGNOSIS — J449 Chronic obstructive pulmonary disease, unspecified: Secondary | ICD-10-CM | POA: Diagnosis present

## 2020-05-01 DIAGNOSIS — R52 Pain, unspecified: Secondary | ICD-10-CM

## 2020-05-01 DIAGNOSIS — K8050 Calculus of bile duct without cholangitis or cholecystitis without obstruction: Secondary | ICD-10-CM

## 2020-05-01 DIAGNOSIS — K8020 Calculus of gallbladder without cholecystitis without obstruction: Principal | ICD-10-CM | POA: Insufficient documentation

## 2020-05-01 DIAGNOSIS — N189 Chronic kidney disease, unspecified: Secondary | ICD-10-CM | POA: Insufficient documentation

## 2020-05-01 DIAGNOSIS — Z7901 Long term (current) use of anticoagulants: Secondary | ICD-10-CM | POA: Insufficient documentation

## 2020-05-01 DIAGNOSIS — Z20822 Contact with and (suspected) exposure to covid-19: Secondary | ICD-10-CM | POA: Diagnosis not present

## 2020-05-01 DIAGNOSIS — K7290 Hepatic failure, unspecified without coma: Secondary | ICD-10-CM | POA: Diagnosis not present

## 2020-05-01 DIAGNOSIS — J45909 Unspecified asthma, uncomplicated: Secondary | ICD-10-CM | POA: Diagnosis not present

## 2020-05-01 DIAGNOSIS — G4701 Insomnia due to medical condition: Secondary | ICD-10-CM

## 2020-05-01 LAB — COMPREHENSIVE METABOLIC PANEL
ALT: 26 U/L (ref 0–44)
AST: 44 U/L — ABNORMAL HIGH (ref 15–41)
Albumin: 3.5 g/dL (ref 3.5–5.0)
Alkaline Phosphatase: 89 U/L (ref 38–126)
Anion gap: 8 (ref 5–15)
BUN: 9 mg/dL (ref 8–23)
CO2: 28 mmol/L (ref 22–32)
Calcium: 9 mg/dL (ref 8.9–10.3)
Chloride: 105 mmol/L (ref 98–111)
Creatinine, Ser: 0.6 mg/dL (ref 0.44–1.00)
GFR, Estimated: >60 mL/min (ref >60)
Glucose, Bld: 102 mg/dL — ABNORMAL HIGH (ref 70–99)
Potassium: 4 mmol/L (ref 3.5–5.1)
Sodium: 141 mmol/L (ref 135–145)
Total Bilirubin: 1.1 mg/dL (ref 0.3–1.2)
Total Protein: 7.1 g/dL (ref 6.5–8.1)

## 2020-05-01 LAB — APTT: aPTT: 31 seconds (ref 24–36)

## 2020-05-01 LAB — CBC WITH DIFFERENTIAL/PLATELET
Abs Immature Granulocytes: 0.01 10*3/uL (ref 0.00–0.07)
Basophils Absolute: 0 10*3/uL (ref 0.0–0.1)
Basophils Relative: 1 %
Eosinophils Absolute: 0.1 10*3/uL (ref 0.0–0.5)
Eosinophils Relative: 1 %
HCT: 39.7 % (ref 36.0–46.0)
Hemoglobin: 12.9 g/dL (ref 12.0–15.0)
Immature Granulocytes: 0 %
Lymphocytes Relative: 17 %
Lymphs Abs: 1 10*3/uL (ref 0.7–4.0)
MCH: 29.7 pg (ref 26.0–34.0)
MCHC: 32.5 g/dL (ref 30.0–36.0)
MCV: 91.3 fL (ref 80.0–100.0)
Monocytes Absolute: 0.7 10*3/uL (ref 0.1–1.0)
Monocytes Relative: 12 %
Neutro Abs: 4 10*3/uL (ref 1.7–7.7)
Neutrophils Relative %: 69 %
Platelets: 146 10*3/uL — ABNORMAL LOW (ref 150–400)
RBC: 4.35 MIL/uL (ref 3.87–5.11)
RDW: 14.2 % (ref 11.5–15.5)
WBC: 5.8 10*3/uL (ref 4.0–10.5)
nRBC: 0 % (ref 0.0–0.2)

## 2020-05-01 LAB — RESP PANEL BY RT-PCR (FLU A&B, COVID) ARPGX2
Influenza A by PCR: NEGATIVE
Influenza B by PCR: NEGATIVE
SARS Coronavirus 2 by RT PCR: NEGATIVE

## 2020-05-01 LAB — LIPASE, BLOOD: Lipase: 35 U/L (ref 11–51)

## 2020-05-01 MED ORDER — PIPERACILLIN-TAZOBACTAM 3.375 G IVPB 30 MIN
3.3750 g | Freq: Once | INTRAVENOUS | Status: DC
  Filled 2020-05-01: qty 50

## 2020-05-01 MED ORDER — HYDROCODONE-ACETAMINOPHEN 5-325 MG PO TABS
1.0000 | ORAL_TABLET | ORAL | Status: DC | PRN
Start: 2020-05-01 — End: 2020-05-02

## 2020-05-01 MED ORDER — PIPERACILLIN-TAZOBACTAM 3.375 G IVPB: 3.3750 g | Freq: Three times a day (TID) | INTRAVENOUS | Status: DC

## 2020-05-01 MED ORDER — LORAZEPAM 0.5 MG PO TABS
0.5000 mg | ORAL_TABLET | Freq: Every day | ORAL | Status: DC
  Administered 2020-05-01: 0.5 mg via ORAL
  Filled 2020-05-01: qty 1

## 2020-05-01 MED ORDER — HEPARIN (PORCINE) 25000 UT/250ML-% IV SOLN
900.0000 [IU]/h | INTRAVENOUS | Status: DC
  Administered 2020-05-01: 900 [IU]/h via INTRAVENOUS
  Filled 2020-05-01: qty 250

## 2020-05-01 MED ORDER — PROPRANOLOL HCL 20 MG PO TABS
20.0000 mg | ORAL_TABLET | Freq: Two times a day (BID) | ORAL | Status: DC
  Administered 2020-05-01 – 2020-05-02 (×2): 20 mg via ORAL
  Filled 2020-05-01 (×3): qty 1

## 2020-05-01 MED ORDER — ALBUTEROL SULFATE HFA 108 (90 BASE) MCG/ACT IN AERS: 2.0000 | INHALATION_SPRAY | RESPIRATORY_TRACT | Status: DC | PRN

## 2020-05-01 MED ORDER — ACETAMINOPHEN 650 MG RE SUPP: 650.0000 mg | Freq: Four times a day (QID) | RECTAL | Status: DC | PRN

## 2020-05-01 MED ORDER — ARFORMOTEROL TARTRATE 15 MCG/2ML IN NEBU
15.0000 ug | INHALATION_SOLUTION | Freq: Two times a day (BID) | RESPIRATORY_TRACT | Status: DC
  Administered 2020-05-02: 15 ug via RESPIRATORY_TRACT
  Filled 2020-05-01 (×3): qty 2

## 2020-05-01 MED ORDER — POLYETHYLENE GLYCOL 3350 17 G PO PACK: 17.0000 g | PACK | Freq: Every day | ORAL | Status: DC | PRN

## 2020-05-01 MED ORDER — MORPHINE SULFATE (PF) 2 MG/ML IV SOLN: 2.0000 mg | INTRAVENOUS | Status: DC | PRN

## 2020-05-01 MED ORDER — UMECLIDINIUM BROMIDE 62.5 MCG/INH IN AEPB
1.0000 | INHALATION_SPRAY | Freq: Every day | RESPIRATORY_TRACT | Status: DC
  Administered 2020-05-02: 1 via RESPIRATORY_TRACT
  Filled 2020-05-01: qty 7

## 2020-05-01 MED ORDER — ACETAMINOPHEN 325 MG PO TABS: 650.0000 mg | ORAL_TABLET | Freq: Four times a day (QID) | ORAL | Status: DC | PRN

## 2020-05-01 MED ORDER — TECHNETIUM TC 99M MEBROFENIN IV KIT
5.4000 | PACK | Freq: Once | INTRAVENOUS | Status: AC | PRN
  Administered 2020-05-01: 5.4 via INTRAVENOUS

## 2020-05-01 MED ORDER — TRAZODONE HCL 50 MG PO TABS
50.0000 mg | ORAL_TABLET | Freq: Every evening | ORAL | Status: DC | PRN
  Administered 2020-05-01: 50 mg via ORAL
  Filled 2020-05-01: qty 1

## 2020-05-01 NOTE — ED Provider Notes (Signed)
[name] DEPT Provider Note   CSN: [id number] Arrival date & time: 05/01/20  1231     History Chief Complaint  Patient presents with  . Abdominal Pain    [name] is a 75 y.o. female with a past medical history of cholelithiasis, GERD, IBS presenting to the ED with a chief complaint of abdominal pain.  States that she has had issues with right upper quadrant and epigastric pain related to her gallstones for the past 10 years.  She was told in the past that she should do "watchful waiting" and was unable to undergo a cholecystectomy.  She has had worsening pain in the past few days.  Minimal improvement noted with NSAIDs with last dose being about 2 days ago.  States that she has chronic diarrhea, denies any nausea, vomiting, fever or chest pain.  She was evaluated by her PCP yesterday, sent for imaging and was called today due to "something in my gallbladder, they wanted me to come here urgently for an operation." Prior abdominal surgeries include appendectomy and C-section.  HPI     Past Medical History:  Diagnosis Date  . Allergy   . Anemia   . Anxiety   . Asthma    DR. BYRUM  . Blood transfusion without reported diagnosis   . Cataract   . Cholelithiasis   . Chronic kidney disease    gallstones   . Compression fracture of body of thoracic vertebra (HCC)   . Depression   . Dyspnea   . Dysrhythmia    palpitations  . GERD (gastroesophageal reflux disease)   . Heart attack ([place])   . Hemorrhoids   . IBS (irritable bowel syndrome)   . Iliotibial band syndrome    left knee  . Migraine   . Mitochondrial myopathy   . Normal coronary arteries    by cardiac catheterization performed by myself 02/25/01  . Osteoporosis   . Pericarditis    age 44  . Pneumothorax   . 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 PVC's (premature ventricular contractions)   . Vitamin D deficiency     Patient Active Problem List   Diagnosis Date Noted  .  Pulmonary embolism ([place]) 05/24/2019  . SDH (subdural hematoma) ([place]) 02/28/2017  . Osteoporosis with fracture 12/03/2016  . 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 Age-related osteoporosis without current pathological fracture 07/08/2016  . IDA (iron deficiency anemia) 10/30/2015  . Need for prophylactic vaccination and inoculation against influenza 10/25/2015  . Laryngopharyngeal reflux (LPR) 09/03/2015  . Cough 02/21/2015  . 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 Iron deficiency anemia 10/12/2013  . DOE (dyspnea on exertion) 01/30/2013  . Polycythemia vera ([place]) 06/17/2012  . 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 History of atypical migraine 03/03/2012  . Nuclear cataract 03/03/2012  . Posterior vitreous detachment 03/03/2012  . Low back pain 06/03/2011  . Back pain 06/03/2011  . Chest pain 09/28/2010  . Personal history of endocrine/metabolic/immunity disorder 04/04/2010  . Encounter for long-term (current) use of medications 04/04/2010  . METATARSALGIA 03/18/2010  . IDIOPATHIC OSTEOPOROSIS 03/18/2010  . INSOMNIA UNSPECIFIED 08/13/2009  . Anxiety state 11/29/2008  . ILIOTIBIAL BAND SYNDROME, LEFT KNEE 08/23/2008  . MIGRAINE HEADACHE 07/28/2007  . UNSPECIFIED MYOPATHY 07/28/2007  . 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 TREMOR, ESSENTIAL/FAMILIAL 03/25/2006  . COPD (chronic obstructive pulmonary disease) ([place]) 03/25/2006  . IRRITABLE BOWEL SYNDROME 03/25/2006  . MENOPAUSAL SYNDROME 03/25/2006    Past Surgical History:  Procedure Laterality Date  . APPENDECTOMY    . CARDIAC  CATHETERIZATION    . CATARACT EXTRACTION W/ INTRAOCULAR LENS  IMPLANT, BILATERAL    . CESAREAN SECTION     x 1  . COLONOSCOPY    . DILATATION & CURETTAGE/HYSTEROSCOPY WITH MYOSURE N/A 10/29/2014   Procedure: DILATATION & CURETTAGE/HYSTEROSCOPY WITH MYOSURE;  Surgeon: Paula Compton, MD;  Location: [facility];  Service: Gynecology;  Laterality: N/A;  . IR FLUORO GUIDED NEEDLE PLC ASPIRATION/INJECTION LOC  06/10/2016  . IR FLUORO  GUIDED NEEDLE PLC ASPIRATION/INJECTION LOC  06/10/2016  . KYPHOPLASTY N/A 05/14/2016   Procedure: T 11 KYPHOPLASTY;  Surgeon: Phylliss Bob, MD;  Location: [facility];  Service: Orthopedics;  Laterality: N/A;  T 11 KYPHOPLASTY  . KYPHOPLASTY N/A 06/18/2016   Procedure: THORACIC 12 KYPHOPLASTY;  Surgeon: Phylliss Bob, MD;  Location: [facility];  Service: Orthopedics;  Laterality: N/A;  THORACIC 12 KYPHOPLASTY; REQUEST 1 HOUR AND FLIP ROOM  . MOUTH SURGERY    . NASAL SINUS SURGERY    . NM MYOCAR PERF WALL MOTION  04/27/2006   normal  . US ECHOCARDIOGRAPHY  12/25/2010   normal  . VARICOSE VEIN SURGERY       OB History   No obstetric history on file.     Family History  Problem Relation Age of Onset  . Asthma Mother   . Arthritis Mother   . Depression Mother   . Emphysema Father   . Stroke Father   . Alcohol abuse Father   . Heart attack Father   . Lung cancer Father   . Colon cancer Neg Hx   . Esophageal cancer Neg Hx   . Stomach cancer Neg Hx   . Pancreatic cancer Neg Hx     Social History   Tobacco Use  . Smoking status: Never Smoker  . Smokeless tobacco: Never Used  . Tobacco comment: never used tobacco  Vaping Use  . Vaping Use: Never used  Substance Use Topics  . Alcohol use: Yes    Alcohol/week: 2.0 standard drinks    Types: 2 Glasses of wine per week    Comment: occasionally  . Drug use: No    Home Medications Prior to Admission medications   Medication Sig Start Date End Date Taking? Authorizing Provider  albuterol (VENTOLIN HFA) 108 (90 Base) MCG/ACT inhaler Inhale 2 puffs into the lungs every 4 (four) hours as needed for wheezing or shortness of breath. 04/04/19   Collene Gobble, MD  budesonide-formoterol (SYMBICORT) 80-4.5 MCG/ACT inhaler INHALE 2 PUFFS INTO THE LUNGS TWICE DAILY 03/08/20   Collene Gobble, MD  dicyclomine (BENTYL) 20 MG tablet Take 1 tablet by mouth as needed. 05/18/18   [provider]  folic acid (FOLVITE) 1 MG tablet TAKE 1 TABLET(1 MG)  BY MOUTH DAILY 03/24/20   [name], MD  LORazepam (ATIVAN) 0.5 MG tablet TAKE 1 TABLET BY MOUTH EVERY NIGHT AT BEDTIME 05/01/14   Leandrew Koyanagi, MD  metroNIDAZOLE (METROCREAM) 0.75 % cream APPLY ON THE FACE BID AS NEEDED FOR FLARES 02/21/18   [provider]  pantoprazole (PROTONIX) 40 MG tablet take 1 tablet by mouth daily before meals 10/13/16   [provider]  propranolol (INDERAL) 20 MG tablet Take 20 mg by mouth 2 (two) times daily. 01/04/19   [provider]  sertraline (ZOLOFT) 100 MG tablet Take 100 mg by mouth daily. 05/24/17   [provider]  Tiotropium Bromide-Olodaterol (STIOLTO RESPIMAT) 2.5-2.5 MCG/ACT AERS Inhale 2 puffs into the lungs daily. 04/29/20   Collene Gobble,  MD  Vitamin D, Ergocalciferol, (DRISDOL) 50000 units CAPS capsule Take 1 capsule (50,000 Units total) by mouth every 7 (seven) days. Sundays 07/08/16   [name], MD  XARELTO 20 MG TABS tablet TAKE 1 TABLET(20 MG) BY MOUTH DAILY WITH SUPPER 01/24/20   [place], Holli Humbles, NP    Allergies    Ruthe Mannan [mometasone furo-formoterol fum]  Review of Systems   Review of Systems  Constitutional: Negative for appetite change, chills and fever.  HENT: Negative for ear pain, rhinorrhea, sneezing and sore throat.   Eyes: Negative for photophobia and visual disturbance.  Respiratory: Negative for cough, chest tightness, shortness of breath and wheezing.   Cardiovascular: Negative for chest pain and palpitations.  Gastrointestinal: Positive for abdominal pain. Negative for blood in stool, constipation, diarrhea, nausea and vomiting.  Genitourinary: Negative for dysuria, hematuria and urgency.  Musculoskeletal: Negative for myalgias.  Skin: Negative for rash.  Neurological: Negative for dizziness, weakness and light-headedness.    Physical Exam Updated Vital Signs BP (!) 145/66   Pulse 66   Temp 97.7 F (36.5 C) (Oral)   Resp 14   SpO2 96%   Physical Exam Vitals and  nursing note reviewed.  Constitutional:      General: She is not in acute distress.    Appearance: She is well-developed.  HENT:     Head: Normocephalic and atraumatic.     Nose: Nose normal.  Eyes:     General: No scleral icterus.       Left eye: No discharge.     Conjunctiva/sclera: Conjunctivae normal.  Cardiovascular:     Rate and Rhythm: Normal rate and regular rhythm.     Heart sounds: Normal heart sounds. No murmur heard. No friction rub. No gallop.   Pulmonary:     Effort: Pulmonary effort is normal. No respiratory distress.     Breath sounds: Normal breath sounds.  Abdominal:     General: Bowel sounds are normal. There is no distension.     Palpations: Abdomen is soft.     Tenderness: There is abdominal tenderness in the right upper quadrant and epigastric area. There is no guarding.  Musculoskeletal:        General: Normal range of motion.     Cervical back: Normal range of motion and neck supple.  Skin:    General: Skin is warm and dry.     Findings: No rash.  Neurological:     Mental Status: She is alert.     Motor: No abnormal muscle tone.     Coordination: Coordination normal.     ED Results / Procedures / Treatments   Labs (all labs ordered are listed, but only abnormal results are displayed) Labs Reviewed  COMPREHENSIVE METABOLIC PANEL - Abnormal; Notable for the following components:      Result Value   Glucose, Bld 102 (*)    AST 44 (*)    All other components within normal limits  CBC WITH DIFFERENTIAL/PLATELET - Abnormal; Notable for the following components:   Platelets 146 (*)    All other components within normal limits  RESP PANEL BY RT-PCR (FLU A&B, COVID) ARPGX2  LIPASE, BLOOD    EKG None  Radiology US Abdomen Limited RUQ (LIVER/GB)  Result Date: 04/30/2020 CLINICAL DATA:  Right upper quadrant pain. EXAM: ULTRASOUND ABDOMEN LIMITED RIGHT UPPER QUADRANT COMPARISON:  CT 04/16/2020. FINDINGS: Gallbladder: Gallstones are noted. The largest  measures 1.2 cm. Questionable tiny gallbladder polyp. Gallbladder wall thickening 5 mm. Positive Murphy sign.  Cholecystitis cannot be excluded. Common bile duct: Diameter: 4.1 mm Liver: Nodular hepatic contour and heterogeneous hepatic parenchymal pattern suggesting cirrhosis. No focal hepatic abnormality identified. Portal vein is patent on color Doppler imaging with normal direction of blood flow towards the liver. Other: None. IMPRESSION: 1. Gallstones. Questionable tiny gallbladder polyp. Gallbladder wall thickening 5 mm. Positive Murphy sign. Cholecystitis cannot be excluded. 2. Nodular hepatic contour and heterogeneous hepatic parenchymal pattern suggesting cirrhosis. Electronically Signed   By: Marcello Moores  Register   On: 04/30/2020 09:08    Procedures Procedures   Medications Ordered in ED Medications - No data to display  ED Course  I have reviewed the triage vital signs and the nursing notes.  Pertinent labs & imaging results that were available during my care of the patient were reviewed by me and considered in my medical decision making (see chart for details).  Clinical Course as of 05/01/20 1457  Wed May 01, 2020  1340 RUQ U/S result from yesterday  IMPRESSION: 1. Gallstones. Questionable tiny gallbladder polyp. Gallbladder wall thickening 5 mm. Positive Murphy sign. Cholecystitis cannot be excluded.  2. Nodular hepatic contour and heterogeneous hepatic parenchymal pattern suggesting cirrhosis. [HK]  1354 WBC: 5.8 [HK]  1412 AST(!): 44 [HK]  1412 ALT: 26 [HK]  1412 Alkaline Phosphatase: 89 [HK]  1412 Total Bilirubin: 1.1 [HK]  1412 Spoke to on-call general surgery team who will see the patient at bedside and give further recommendations.  I verified with patient and she still currently is on the Xarelto and last dose was last night. [HK]    Clinical Course User Index [HK] Delia Heady, PA-C   MDM Rules/Calculators/A&P                          75 year old female with  past medical history of cholelithiasis, GERD, IBS presenting to the ED with a chief complaint of abdominal pain.  She has had issues with right upper quadrant epigastric pain related to gallstones for the past 10 years.  She was told in the past that she should practice "watchful waiting" and was unable to undergo a cholecystectomy.  Worsening pain in the past few days with minimal improvement noted with NSAIDs.  Reports chronic diarrhea and this is unchanged.  No vomiting, fever.  Evaluated by PCP and was sent for outpatient imaging yesterday and was called today due to possible cholecystitis.  On exam patient does have tenderness of the right upper quadrant epigastric area without rebound or guarding.  She is afebrile here.  Reviewed imaging from yesterday which does show cholelithiasis with gallbladder wall thickening of 5 mm and positive Murphy sign.  Cannot exclude cholecystitis.  Lab work here without any leukocytosis or transaminitis.  T bili is normal.  I did obtain a routine Covid test on her which was negative.  I have consulted general surgery team to evaluate the patient in consult.  They will see the patient at the bedside for further recommendations.  Patient is still currently on Xarelto.  2:57 PM Brooke, PA from general surgery evaluated patient at the bedside.  States that they do plan to take patient for a cholecystectomy but will start with antibiotics as she will need medical clearance as well as admission to the hospitalist service.   Portions of this note were generated with [employer]. Dictation errors may occur despite best attempts at proofreading.  Final Clinical Impression(s) / ED Diagnoses Final diagnoses:  Biliary colic  Gallstones  Rx / DC Orders ED Discharge Orders    None       Delia Heady, PA-C 05/01/20 1459    Lorelle Gibbs, DO 05/01/20 1542

## 2020-05-01 NOTE — Progress Notes (Signed)
[facility] for Heparin Indication: pulmonary embolus and DVT, Lupus anticoagulant  Allergies  Allergen Reactions  . [facility] [Mometasone Furo-Formoterol Fum] Cough    Patient Measurements:   Heparin Dosing Weight: 61.8 kg  Vital Signs: Temp: 98 F (36.7 C) (04/06 1547) Temp Source: Oral (04/06 1547) BP: 116/71 (04/06 1547) Pulse Rate: 67 (04/06 1547)  Labs: Recent Labs    05/01/20 1335  HGB 12.9  HCT 39.7  PLT 146*  CREATININE 0.60   Estimated Creatinine Clearance: 49.5 mL/min (by C-G formula based on SCr of 0.6 mg/dL).  Medical History: Past Medical History:  Diagnosis Date  . Allergy   . Anemia   . Anxiety   . Asthma    DR. BYRUM  . Blood transfusion without reported diagnosis   . Cataract   . Cholelithiasis   . Chronic kidney disease    gallstones   . Compression fracture of body of thoracic vertebra (HCC)   . Depression   . Dyspnea   . Dysrhythmia    palpitations  . GERD (gastroesophageal reflux disease)   . Heart attack ([place])   . Hemorrhoids   . IBS (irritable bowel syndrome)   . Iliotibial band syndrome    left knee  . Migraine   . Mitochondrial myopathy   . Normal coronary arteries    by cardiac catheterization performed by myself 02/25/01  . Osteoporosis   . Pericarditis    age 75  . Pneumothorax   . 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 PVC's (premature ventricular contractions)   . Vitamin D deficiency    Medications:  Scheduled:  . arformoterol  15 mcg Nebulization BID  . LORazepam  0.5 mg Oral QHS  . propranolol  20 mg Oral BID  . umeclidinium bromide  1 puff Inhalation Daily   Infusions:  . piperacillin-tazobactam    . piperacillin-tazobactam (ZOSYN)  IV      Assessment: 31 yoF with RUQ pain, poss cholecystitis - hx gallstones, but sx atypical for GB PMH: Lupus anticoagulant +, PE/DVT Feb 2021 on Xarelto 20mg  qd - last dose 4/5 at 6pm, CKD  Begin Heparin infusion for bridging for possible  surgery.  HIDA scan performed Baseline aPTT 31 seconds H/H wnl, Plt 146   Goal of Therapy:  Heparin level 0.3-0.7 units/ml aPTT 66-102 seconds Monitor platelets by anticoagulation protocol: Yes   Plan:  Begin Heparin infusion at 900 units/hr, no load Check 8 hr Hep level and aPTT Monitor for s/s Bleed  Minda Ditto PharmD 05/01/2020,3:50 PM

## 2020-05-01 NOTE — ED Notes (Signed)
Per Nuc Med, patient to remain NPO until Hida scan

## 2020-05-01 NOTE — ED Triage Notes (Signed)
Pt states she needs surgery to remove gallbladder. Pain worsening today.

## 2020-05-01 NOTE — H&P (Signed)
History and Physical        Hospital Admission Note Date: 05/01/2020  Patient name: [name] Medical record number: [id number] Date of birth: 1945/08/07 Age: 75 y.o. Gender: female  PCP: Velna Hatchet, MD    Chief Complaint    Chief Complaint  Patient presents with  . Abdominal Pain      HPI:   This is a 75 year old female with past medical history of cholelithiasis, GERD, IBS, COPD, hemochromatosis, polycythemia vera, lupus anticoagulant positive and history of PE on chronic anticoagulation who presented to the ED with abdominal pain.  She has had issues with RUQ and epigastric abdominal pain for the past 10 years and has been evaluated by surgery in the past with recommendations for watchful waiting.  Has not undergone a cholecystectomy as of yet.  She has had minimal improvement with NSAIDs at home. She has been having worsening pain as of lately as well and was evaluated by her PCP yesterday who ordered an RUQ [place] which showed 5 mm gallbladder wall thickening and positive Murphy sign and she was referred to the ED for further management. Currently states that her pain is intermittent and also radiates across her upper abdomen.    ED Course: Afebrile, hemodynamically stable, on room air. Labs overall unremarkable.  General surgery evaluated the patient at bedside and recommended medicine admission and medical clearance with plan for antibiotics and cholecystectomy this hospitalization.  She did not receive anything in the ED.   Vitals:   05/01/20 1547 05/01/20 1610  BP: 116/71 (!) 158/72  Pulse: 67 69  Resp: 16 20  Temp: 98 F (36.7 C) 98.1 F (36.7 C)  SpO2: 98% 97%     Review of Systems:  Review of Systems  All other systems reviewed and are negative.   Medical/Social/Family History   Past Medical History: Past Medical History:  Diagnosis Date  .  Allergy   . Anemia   . Anxiety   . Asthma    DR. BYRUM  . Blood transfusion without reported diagnosis   . Cataract   . Cholelithiasis   . Chronic kidney disease    gallstones   . Compression fracture of body of thoracic vertebra (HCC)   . Depression   . Dyspnea   . Dysrhythmia    palpitations  . GERD (gastroesophageal reflux disease)   . Heart attack ([place])   . Hemorrhoids   . IBS (irritable bowel syndrome)   . Iliotibial band syndrome    left knee  . Migraine   . Mitochondrial myopathy   . Normal coronary arteries    by cardiac catheterization performed by myself 02/25/01  . Osteoporosis   . Pericarditis    age 30  . Pneumothorax   . 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 PVC's (premature ventricular contractions)   . Vitamin D deficiency     Past Surgical History:  Procedure Laterality Date  . APPENDECTOMY    . CARDIAC CATHETERIZATION    . CATARACT EXTRACTION W/ INTRAOCULAR LENS  IMPLANT, BILATERAL    . CESAREAN SECTION     x 1  . COLONOSCOPY    . DILATATION & CURETTAGE/HYSTEROSCOPY WITH MYOSURE N/A 10/29/2014   Procedure: DILATATION & CURETTAGE/HYSTEROSCOPY  WITH MYOSURE;  Surgeon: Paula Compton, MD;  Location: [facility];  Service: Gynecology;  Laterality: N/A;  . IR FLUORO GUIDED NEEDLE PLC ASPIRATION/INJECTION LOC  06/10/2016  . IR FLUORO GUIDED NEEDLE PLC ASPIRATION/INJECTION LOC  06/10/2016  . KYPHOPLASTY N/A 05/14/2016   Procedure: T 11 KYPHOPLASTY;  Surgeon: Phylliss Bob, MD;  Location: [facility];  Service: Orthopedics;  Laterality: N/A;  T 11 KYPHOPLASTY  . KYPHOPLASTY N/A 06/18/2016   Procedure: THORACIC 12 KYPHOPLASTY;  Surgeon: Phylliss Bob, MD;  Location: [facility];  Service: Orthopedics;  Laterality: N/A;  THORACIC 12 KYPHOPLASTY; REQUEST 1 HOUR AND FLIP ROOM  . MOUTH SURGERY    . NASAL SINUS SURGERY    . NM MYOCAR PERF WALL MOTION  04/27/2006   normal  . US ECHOCARDIOGRAPHY  12/25/2010   normal  . VARICOSE VEIN SURGERY      Medications: Prior to Admission  medications   Medication Sig Start Date End Date Taking? Authorizing Provider  albuterol (VENTOLIN HFA) 108 (90 Base) MCG/ACT inhaler Inhale 2 puffs into the lungs every 4 (four) hours as needed for wheezing or shortness of breath. 04/04/19   Collene Gobble, MD  budesonide-formoterol (SYMBICORT) 80-4.5 MCG/ACT inhaler INHALE 2 PUFFS INTO THE LUNGS TWICE DAILY 03/08/20   Collene Gobble, MD  dicyclomine (BENTYL) 20 MG tablet Take 1 tablet by mouth as needed. 05/18/18   [provider]  folic acid (FOLVITE) 1 MG tablet TAKE 1 TABLET(1 MG) BY MOUTH DAILY 03/24/20   [name], MD  LORazepam (ATIVAN) 0.5 MG tablet TAKE 1 TABLET BY MOUTH EVERY NIGHT AT BEDTIME 05/01/14   Leandrew Koyanagi, MD  metroNIDAZOLE (METROCREAM) 0.75 % cream APPLY ON THE FACE BID AS NEEDED FOR FLARES 02/21/18   [provider]  pantoprazole (PROTONIX) 40 MG tablet take 1 tablet by mouth daily before meals 10/13/16   [provider]  propranolol (INDERAL) 20 MG tablet Take 20 mg by mouth 2 (two) times daily. 01/04/19   [provider]  sertraline (ZOLOFT) 100 MG tablet Take 100 mg by mouth daily. 05/24/17   [provider]  Tiotropium Bromide-Olodaterol (STIOLTO RESPIMAT) 2.5-2.5 MCG/ACT AERS Inhale 2 puffs into the lungs daily. 04/29/20   Collene Gobble, MD  Vitamin D, Ergocalciferol, (DRISDOL) 50000 units CAPS capsule Take 1 capsule (50,000 Units total) by mouth every 7 (seven) days. Sundays 07/08/16   [name], MD  XARELTO 20 MG TABS tablet TAKE 1 TABLET(20 MG) BY MOUTH DAILY WITH SUPPER 01/24/20   [place], Holli Humbles, NP    Allergies:   Allergies  Allergen Reactions  . Dulera [Mometasone Furo-Formoterol Fum] Cough    Social History:  reports that she has never smoked. She has never used smokeless tobacco. She reports current alcohol use of about 2.0 standard drinks of alcohol per week. She reports that she does not use drugs.  Family History: Family History  Problem  Relation Age of Onset  . Asthma Mother   . Arthritis Mother   . Depression Mother   . Emphysema Father   . Stroke Father   . Alcohol abuse Father   . Heart attack Father   . Lung cancer Father   . Colon cancer Neg Hx   . Esophageal cancer Neg Hx   . Stomach cancer Neg Hx   . Pancreatic cancer Neg Hx      Objective   Physical Exam: Blood pressure (!) 158/72, pulse 69, temperature 98.1 F (36.7 C), temperature source Oral, resp.  rate 20, SpO2 97 %.  Physical Exam Vitals and nursing note reviewed.  Constitutional:      Appearance: Normal appearance.  HENT:     Head: Normocephalic and atraumatic.  Eyes:     Conjunctiva/sclera: Conjunctivae normal.  Cardiovascular:     Rate and Rhythm: Normal rate and regular rhythm.  Pulmonary:     Effort: Pulmonary effort is normal.     Breath sounds: Normal breath sounds.  Abdominal:     General: Abdomen is flat.     Palpations: Abdomen is soft.     Tenderness: There is no abdominal tenderness.  Musculoskeletal:        General: No swelling or tenderness.  Skin:    Coloration: Skin is not jaundiced or pale.  Neurological:     Mental Status: She is alert. Mental status is at baseline.  Psychiatric:        Mood and Affect: Mood normal.        Behavior: Behavior normal.     LABS on Admission: I have personally reviewed all the labs and imaging below    Basic Metabolic Panel: Recent Labs  Lab 05/01/20 1335  NA 141  K 4.0  CL 105  CO2 28  GLUCOSE 102*  BUN 9  CREATININE 0.60  CALCIUM 9.0   Liver Function Tests: Recent Labs  Lab 05/01/20 1335  AST 44*  ALT 26  ALKPHOS 89  BILITOT 1.1  PROT 7.1  ALBUMIN 3.5   Recent Labs  Lab 05/01/20 1335  LIPASE 35   No results for input(s): AMMONIA in the last 168 hours. CBC: Recent Labs  Lab 05/01/20 1335  WBC 5.8  NEUTROABS 4.0  HGB 12.9  HCT 39.7  MCV 91.3  PLT 146*   Cardiac Enzymes: No results for input(s): CKTOTAL, CKMB, CKMBINDEX, TROPONINI in the last 168  hours. BNP: Invalid input(s): POCBNP CBG: No results for input(s): GLUCAP in the last 168 hours.  Radiological Exams on Admission:  US Abdomen Limited RUQ (LIVER/GB)  Result Date: 04/30/2020 CLINICAL DATA:  Right upper quadrant pain. EXAM: ULTRASOUND ABDOMEN LIMITED RIGHT UPPER QUADRANT COMPARISON:  CT 04/16/2020. FINDINGS: Gallbladder: Gallstones are noted. The largest measures 1.2 cm. Questionable tiny gallbladder polyp. Gallbladder wall thickening 5 mm. Positive Murphy sign. Cholecystitis cannot be excluded. Common bile duct: Diameter: 4.1 mm Liver: Nodular hepatic contour and heterogeneous hepatic parenchymal pattern suggesting cirrhosis. No focal hepatic abnormality identified. Portal vein is patent on color Doppler imaging with normal direction of blood flow towards the liver. Other: None. IMPRESSION: 1. Gallstones. Questionable tiny gallbladder polyp. Gallbladder wall thickening 5 mm. Positive Murphy sign. Cholecystitis cannot be excluded. 2. Nodular hepatic contour and heterogeneous hepatic parenchymal pattern suggesting cirrhosis. Electronically Signed   By: Marcello Moores  Register   On: 04/30/2020 09:08      EKG: NSR, prolonged QT interval, TWI in V2-V6 (noted on prior EKGs)   A & P   Principal Problem:   RUQ pain Active Problems:   COPD (chronic obstructive pulmonary disease) (HCC)   Hemochromatosis   Pulmonary embolism (HCC)   Prolonged QT interval   T wave inversion in EKG   1. RUQ pain  Gallstones  possible cholecystis  a. RUQ [place] with cirrhosis, gallstones, 5 mm gallbladder wall thickening and positive murphy sign b. Afebrile and without leukocytosis c. General surgery on board -> HIDA scan. If she needs cholecystectomy then she will need pulmonology and hematology preop eval. Ok to hold antibiotics d. Will change Xarelto to Heparin for now  in case she does need surgery e. NPO after midnight  2. T wave inversions a. Noted on multiple prior EKGs b. Will check an echo as  there isn't one on file  3. Prolonged QTc a. Hold QT prolonging agents b. Continue telemetry  4. COPD, not in acute exacerbation a. Change home Stiolto to brovana/incruse ellipta due to formulary  5. History of Extensive DVT of LLE and PE (Dx 03/17/2019)  Polycythemia vera (JAK2 neg)  Hemochromatosis  a. Change xarelto to heparin for now b. No need for phlebotomy c. May need heme onc input per surgery if she needs surgery this admission     DVT prophylaxis: heparin   Code Status: DNR  Diet: heart healthy Family Communication: Admission, patients condition and plan of care including tests being ordered have been discussed with the patient who indicates understanding and agrees with the plan and Code Status. Patient's family at bedside was updated  Disposition Plan: The appropriate patient status for this patient is OBSERVATION. Observation status is judged to be reasonable and necessary in order to provide the required intensity of service to ensure the patient's safety. The patient's presenting symptoms, physical exam findings, and initial radiographic and laboratory data in the context of their medical condition is felt to place them at decreased risk for further clinical deterioration. Furthermore, it is anticipated that the patient will be medically stable for discharge from the hospital within 2 midnights of admission. The following factors support the patient status of observation.   " The patient's presenting symptoms include intermittent abdominal pain. " The physical exam findings include unremarkable. " The initial radiographic and laboratory data are as above.    Status is: Observation  The patient remains OBS appropriate and will d/c before 2 midnights.  Dispo: The patient is from: Home              Anticipated d/c is to: Home              Patient currently is not medically stable to d/c.   Difficult to place patient No      Consultants  . General  surgery  Procedures  . none  Time Spent on Admission: 68 minutes    Harold Hedge, DO [facility]  05/01/2020, 5:45 PM

## 2020-05-01 NOTE — Consult Note (Addendum)
[facility] Surgery Consult Note  [name] Jun 01, 1945  [id number].    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 Marin Olp, COPD followed by Dr. Lamonte Sakai, GERD, CKD, Mitochondrial myopathy, and Cirrhosis who presented to [facility] earlier today with worsening abdominal/back pain. She reports a history of intermittent RUQ pain for years. She has known gallstones and saw Dr. Barry Dienes in our office in 2017. At that time she was high risk for surgery given her multiple medical problems and elective surgery was ill-advised given that her gallstones were minimally symptomatic.  She reports suffering a fall on 04/16/20. She did not hit her abdomen at that time. She was evaluated at [facility] with no acute findings. States that 2 days later she developed different and worsening abdominal pain. Pain is RUQ and radiates across her upper abdomen. She also reports having some back pain. Pain is intermittent. Worse with movement, feels better when she lies still. Tried ibuprofen but this did not help much. She has been tolerating a diet and this does not increase her pain. Denies nausea, vomiting, fever, chills, diarrhea. She was evaluated by her PCP who sent her for an u/s yesterday which shows cirrhosis, gallstones, questionable tiny gallbladder polyp, gallbladder wall thickening 5 mm, positive Murphy sign, cholecystitis cannot be excluded. She was advised to come to the ED. In the ED lab work revealed no leukocytosis (WBC 5.8) and essentially normal LFTs (Ast 44, ALT 26, Alk phos 89, Tbili 1.1).  General surgery asked to see.  Abdominal surgical history: appendectomy, c section Nonsmoker Drinks alcohol occassionally  Review of Systems  Constitutional: Negative.    Gastrointestinal: Positive for abdominal pain. Negative for diarrhea, nausea and vomiting.  Musculoskeletal: Positive for back pain.    All systems reviewed and otherwise negative except for as above  Family History  Problem Relation Age of Onset  . Asthma Mother   . Arthritis Mother   . Depression Mother   . Emphysema Father   . Stroke Father   . Alcohol abuse Father   . Heart attack Father   . Lung cancer Father   . Colon cancer Neg Hx   . Esophageal cancer Neg Hx   . Stomach cancer Neg Hx   . Pancreatic cancer Neg Hx     Past Medical History:  Diagnosis Date  . Allergy   . Anemia   . Anxiety   . Asthma    DR. BYRUM  . Blood transfusion without reported diagnosis   . Cataract   . Cholelithiasis   . Chronic kidney disease    gallstones   . Compression fracture of body of thoracic vertebra (HCC)   . Depression   . Dyspnea   . Dysrhythmia    palpitations  . GERD (gastroesophageal reflux disease)   . Heart attack ([place])   . Hemorrhoids   . IBS (irritable bowel syndrome)   . Iliotibial band syndrome    left knee  . Migraine   . Mitochondrial myopathy   . Normal coronary arteries    by cardiac catheterization performed by myself 02/25/01  . Osteoporosis   . Pericarditis    age 48  . Pneumothorax   . 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 PVC's (premature ventricular contractions)   . Vitamin D deficiency     Past  [facility] Surgery Consult Note  [name] Jun 01, 1945  [id number].    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 Marin Olp, COPD followed by Dr. Lamonte Sakai, GERD, CKD, Mitochondrial myopathy, and Cirrhosis who presented to [facility] earlier today with worsening abdominal/back pain. She reports a history of intermittent RUQ pain for years. She has known gallstones and saw Dr. Barry Dienes in our office in 2017. At that time she was high risk for surgery given her multiple medical problems and elective surgery was ill-advised given that her gallstones were minimally symptomatic.  She reports suffering a fall on 04/16/20. She did not hit her abdomen at that time. She was evaluated at [facility] with no acute findings. States that 2 days later she developed different and worsening abdominal pain. Pain is RUQ and radiates across her upper abdomen. She also reports having some back pain. Pain is intermittent. Worse with movement, feels better when she lies still. Tried ibuprofen but this did not help much. She has been tolerating a diet and this does not increase her pain. Denies nausea, vomiting, fever, chills, diarrhea. She was evaluated by her PCP who sent her for an u/s yesterday which shows cirrhosis, gallstones, questionable tiny gallbladder polyp, gallbladder wall thickening 5 mm, positive Murphy sign, cholecystitis cannot be excluded. She was advised to come to the ED. In the ED lab work revealed no leukocytosis (WBC 5.8) and essentially normal LFTs (Ast 44, ALT 26, Alk phos 89, Tbili 1.1).  General surgery asked to see.  Abdominal surgical history: appendectomy, c section Nonsmoker Drinks alcohol occassionally  Review of Systems  Constitutional: Negative.    Gastrointestinal: Positive for abdominal pain. Negative for diarrhea, nausea and vomiting.  Musculoskeletal: Positive for back pain.    All systems reviewed and otherwise negative except for as above  Family History  Problem Relation Age of Onset  . Asthma Mother   . Arthritis Mother   . Depression Mother   . Emphysema Father   . Stroke Father   . Alcohol abuse Father   . Heart attack Father   . Lung cancer Father   . Colon cancer Neg Hx   . Esophageal cancer Neg Hx   . Stomach cancer Neg Hx   . Pancreatic cancer Neg Hx     Past Medical History:  Diagnosis Date  . Allergy   . Anemia   . Anxiety   . Asthma    DR. BYRUM  . Blood transfusion without reported diagnosis   . Cataract   . Cholelithiasis   . Chronic kidney disease    gallstones   . Compression fracture of body of thoracic vertebra (HCC)   . Depression   . Dyspnea   . Dysrhythmia    palpitations  . GERD (gastroesophageal reflux disease)   . Heart attack ([place])   . Hemorrhoids   . IBS (irritable bowel syndrome)   . Iliotibial band syndrome    left knee  . Migraine   . Mitochondrial myopathy   . Normal coronary arteries    by cardiac catheterization performed by myself 02/25/01  . Osteoporosis   . Pericarditis    age 48  . Pneumothorax   . 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 PVC's (premature ventricular contractions)   . Vitamin D deficiency     Past  [facility] Surgery Consult Note  [name] Jun 01, 1945  [id number].    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 Marin Olp, COPD followed by Dr. Lamonte Sakai, GERD, CKD, Mitochondrial myopathy, and Cirrhosis who presented to [facility] earlier today with worsening abdominal/back pain. She reports a history of intermittent RUQ pain for years. She has known gallstones and saw Dr. Barry Dienes in our office in 2017. At that time she was high risk for surgery given her multiple medical problems and elective surgery was ill-advised given that her gallstones were minimally symptomatic.  She reports suffering a fall on 04/16/20. She did not hit her abdomen at that time. She was evaluated at [facility] with no acute findings. States that 2 days later she developed different and worsening abdominal pain. Pain is RUQ and radiates across her upper abdomen. She also reports having some back pain. Pain is intermittent. Worse with movement, feels better when she lies still. Tried ibuprofen but this did not help much. She has been tolerating a diet and this does not increase her pain. Denies nausea, vomiting, fever, chills, diarrhea. She was evaluated by her PCP who sent her for an u/s yesterday which shows cirrhosis, gallstones, questionable tiny gallbladder polyp, gallbladder wall thickening 5 mm, positive Murphy sign, cholecystitis cannot be excluded. She was advised to come to the ED. In the ED lab work revealed no leukocytosis (WBC 5.8) and essentially normal LFTs (Ast 44, ALT 26, Alk phos 89, Tbili 1.1).  General surgery asked to see.  Abdominal surgical history: appendectomy, c section Nonsmoker Drinks alcohol occassionally  Review of Systems  Constitutional: Negative.    Gastrointestinal: Positive for abdominal pain. Negative for diarrhea, nausea and vomiting.  Musculoskeletal: Positive for back pain.    All systems reviewed and otherwise negative except for as above  Family History  Problem Relation Age of Onset  . Asthma Mother   . Arthritis Mother   . Depression Mother   . Emphysema Father   . Stroke Father   . Alcohol abuse Father   . Heart attack Father   . Lung cancer Father   . Colon cancer Neg Hx   . Esophageal cancer Neg Hx   . Stomach cancer Neg Hx   . Pancreatic cancer Neg Hx     Past Medical History:  Diagnosis Date  . Allergy   . Anemia   . Anxiety   . Asthma    DR. BYRUM  . Blood transfusion without reported diagnosis   . Cataract   . Cholelithiasis   . Chronic kidney disease    gallstones   . Compression fracture of body of thoracic vertebra (HCC)   . Depression   . Dyspnea   . Dysrhythmia    palpitations  . GERD (gastroesophageal reflux disease)   . Heart attack ([place])   . Hemorrhoids   . IBS (irritable bowel syndrome)   . Iliotibial band syndrome    left knee  . Migraine   . Mitochondrial myopathy   . Normal coronary arteries    by cardiac catheterization performed by myself 02/25/01  . Osteoporosis   . Pericarditis    age 48  . Pneumothorax   . 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 PVC's (premature ventricular contractions)   . Vitamin D deficiency     Past  [facility] Surgery Consult Note  [name] Jun 01, 1945  [id number].    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 Marin Olp, COPD followed by Dr. Lamonte Sakai, GERD, CKD, Mitochondrial myopathy, and Cirrhosis who presented to [facility] earlier today with worsening abdominal/back pain. She reports a history of intermittent RUQ pain for years. She has known gallstones and saw Dr. Barry Dienes in our office in 2017. At that time she was high risk for surgery given her multiple medical problems and elective surgery was ill-advised given that her gallstones were minimally symptomatic.  She reports suffering a fall on 04/16/20. She did not hit her abdomen at that time. She was evaluated at [facility] with no acute findings. States that 2 days later she developed different and worsening abdominal pain. Pain is RUQ and radiates across her upper abdomen. She also reports having some back pain. Pain is intermittent. Worse with movement, feels better when she lies still. Tried ibuprofen but this did not help much. She has been tolerating a diet and this does not increase her pain. Denies nausea, vomiting, fever, chills, diarrhea. She was evaluated by her PCP who sent her for an u/s yesterday which shows cirrhosis, gallstones, questionable tiny gallbladder polyp, gallbladder wall thickening 5 mm, positive Murphy sign, cholecystitis cannot be excluded. She was advised to come to the ED. In the ED lab work revealed no leukocytosis (WBC 5.8) and essentially normal LFTs (Ast 44, ALT 26, Alk phos 89, Tbili 1.1).  General surgery asked to see.  Abdominal surgical history: appendectomy, c section Nonsmoker Drinks alcohol occassionally  Review of Systems  Constitutional: Negative.    Gastrointestinal: Positive for abdominal pain. Negative for diarrhea, nausea and vomiting.  Musculoskeletal: Positive for back pain.    All systems reviewed and otherwise negative except for as above  Family History  Problem Relation Age of Onset  . Asthma Mother   . Arthritis Mother   . Depression Mother   . Emphysema Father   . Stroke Father   . Alcohol abuse Father   . Heart attack Father   . Lung cancer Father   . Colon cancer Neg Hx   . Esophageal cancer Neg Hx   . Stomach cancer Neg Hx   . Pancreatic cancer Neg Hx     Past Medical History:  Diagnosis Date  . Allergy   . Anemia   . Anxiety   . Asthma    DR. BYRUM  . Blood transfusion without reported diagnosis   . Cataract   . Cholelithiasis   . Chronic kidney disease    gallstones   . Compression fracture of body of thoracic vertebra (HCC)   . Depression   . Dyspnea   . Dysrhythmia    palpitations  . GERD (gastroesophageal reflux disease)   . Heart attack ([place])   . Hemorrhoids   . IBS (irritable bowel syndrome)   . Iliotibial band syndrome    left knee  . Migraine   . Mitochondrial myopathy   . Normal coronary arteries    by cardiac catheterization performed by myself 02/25/01  . Osteoporosis   . Pericarditis    age 48  . Pneumothorax   . 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 PVC's (premature ventricular contractions)   . Vitamin D deficiency     Past  [facility] Surgery Consult Note  [name] Jun 01, 1945  [id number].    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 Marin Olp, COPD followed by Dr. Lamonte Sakai, GERD, CKD, Mitochondrial myopathy, and Cirrhosis who presented to [facility] earlier today with worsening abdominal/back pain. She reports a history of intermittent RUQ pain for years. She has known gallstones and saw Dr. Barry Dienes in our office in 2017. At that time she was high risk for surgery given her multiple medical problems and elective surgery was ill-advised given that her gallstones were minimally symptomatic.  She reports suffering a fall on 04/16/20. She did not hit her abdomen at that time. She was evaluated at [facility] with no acute findings. States that 2 days later she developed different and worsening abdominal pain. Pain is RUQ and radiates across her upper abdomen. She also reports having some back pain. Pain is intermittent. Worse with movement, feels better when she lies still. Tried ibuprofen but this did not help much. She has been tolerating a diet and this does not increase her pain. Denies nausea, vomiting, fever, chills, diarrhea. She was evaluated by her PCP who sent her for an u/s yesterday which shows cirrhosis, gallstones, questionable tiny gallbladder polyp, gallbladder wall thickening 5 mm, positive Murphy sign, cholecystitis cannot be excluded. She was advised to come to the ED. In the ED lab work revealed no leukocytosis (WBC 5.8) and essentially normal LFTs (Ast 44, ALT 26, Alk phos 89, Tbili 1.1).  General surgery asked to see.  Abdominal surgical history: appendectomy, c section Nonsmoker Drinks alcohol occassionally  Review of Systems  Constitutional: Negative.    Gastrointestinal: Positive for abdominal pain. Negative for diarrhea, nausea and vomiting.  Musculoskeletal: Positive for back pain.    All systems reviewed and otherwise negative except for as above  Family History  Problem Relation Age of Onset  . Asthma Mother   . Arthritis Mother   . Depression Mother   . Emphysema Father   . Stroke Father   . Alcohol abuse Father   . Heart attack Father   . Lung cancer Father   . Colon cancer Neg Hx   . Esophageal cancer Neg Hx   . Stomach cancer Neg Hx   . Pancreatic cancer Neg Hx     Past Medical History:  Diagnosis Date  . Allergy   . Anemia   . Anxiety   . Asthma    DR. BYRUM  . Blood transfusion without reported diagnosis   . Cataract   . Cholelithiasis   . Chronic kidney disease    gallstones   . Compression fracture of body of thoracic vertebra (HCC)   . Depression   . Dyspnea   . Dysrhythmia    palpitations  . GERD (gastroesophageal reflux disease)   . Heart attack ([place])   . Hemorrhoids   . IBS (irritable bowel syndrome)   . Iliotibial band syndrome    left knee  . Migraine   . Mitochondrial myopathy   . Normal coronary arteries    by cardiac catheterization performed by myself 02/25/01  . Osteoporosis   . Pericarditis    age 48  . Pneumothorax   . 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 PVC's (premature ventricular contractions)   . Vitamin D deficiency     Past

## 2020-05-02 ENCOUNTER — Observation Stay (HOSPITAL_COMMUNITY): Payer: Medicare Other

## 2020-05-02 ENCOUNTER — Other Ambulatory Visit (HOSPITAL_COMMUNITY): Payer: Medicare Other

## 2020-05-02 DIAGNOSIS — R1011 Right upper quadrant pain: Secondary | ICD-10-CM | POA: Diagnosis not present

## 2020-05-02 DIAGNOSIS — K8020 Calculus of gallbladder without cholecystitis without obstruction: Secondary | ICD-10-CM | POA: Diagnosis not present

## 2020-05-02 DIAGNOSIS — I2699 Other pulmonary embolism without acute cor pulmonale: Secondary | ICD-10-CM | POA: Diagnosis not present

## 2020-05-02 DIAGNOSIS — J449 Chronic obstructive pulmonary disease, unspecified: Secondary | ICD-10-CM | POA: Diagnosis not present

## 2020-05-02 DIAGNOSIS — R0781 Pleurodynia: Secondary | ICD-10-CM | POA: Diagnosis not present

## 2020-05-02 LAB — COMPREHENSIVE METABOLIC PANEL
ALT: 24 U/L (ref 0–44)
AST: 36 U/L (ref 15–41)
Albumin: 3.3 g/dL — ABNORMAL LOW (ref 3.5–5.0)
Alkaline Phosphatase: 81 U/L (ref 38–126)
Anion gap: 9 (ref 5–15)
BUN: 7 mg/dL — ABNORMAL LOW (ref 8–23)
CO2: 28 mmol/L (ref 22–32)
Calcium: 8.9 mg/dL (ref 8.9–10.3)
Chloride: 101 mmol/L (ref 98–111)
Creatinine, Ser: 0.64 mg/dL (ref 0.44–1.00)
GFR, Estimated: >60 mL/min (ref >60)
Glucose, Bld: 96 mg/dL (ref 70–99)
Potassium: 3.9 mmol/L (ref 3.5–5.1)
Sodium: 138 mmol/L (ref 135–145)
Total Bilirubin: 1.6 mg/dL — ABNORMAL HIGH (ref 0.3–1.2)
Total Protein: 6.7 g/dL (ref 6.5–8.1)

## 2020-05-02 LAB — CBC
HCT: 39.3 % (ref 36.0–46.0)
Hemoglobin: 12.8 g/dL (ref 12.0–15.0)
MCH: 29.9 pg (ref 26.0–34.0)
MCHC: 32.6 g/dL (ref 30.0–36.0)
MCV: 91.8 fL (ref 80.0–100.0)
Platelets: 127 10*3/uL — ABNORMAL LOW (ref 150–400)
RBC: 4.28 MIL/uL (ref 3.87–5.11)
RDW: 14.3 % (ref 11.5–15.5)
WBC: 5.9 10*3/uL (ref 4.0–10.5)
nRBC: 0 % (ref 0.0–0.2)

## 2020-05-02 LAB — APTT: aPTT: 80 seconds — ABNORMAL HIGH (ref 24–36)

## 2020-05-02 LAB — HEPARIN LEVEL (UNFRACTIONATED): Heparin Unfractionated: 0.51 IU/mL (ref 0.30–0.70)

## 2020-05-02 MED ORDER — LOPERAMIDE HCL 2 MG PO CAPS
2.0000 mg | ORAL_CAPSULE | ORAL | Status: DC | PRN
  Administered 2020-05-02: 2 mg via ORAL
  Filled 2020-05-02: qty 1

## 2020-05-02 MED ORDER — DICLOFENAC SODIUM 1 % EX GEL
2.0000 g | Freq: Four times a day (QID) | CUTANEOUS | Status: DC
  Filled 2020-05-02: qty 100

## 2020-05-02 MED ORDER — COVID-19 MRNA VACC (MODERNA) 50 MCG/0.25ML IM SUSP
0.2500 mL | Freq: Once | INTRAMUSCULAR | Status: AC
  Administered 2020-05-02: 0.25 mL via INTRAMUSCULAR
  Filled 2020-05-02: qty 0.25

## 2020-05-02 MED ORDER — RIVAROXABAN 20 MG PO TABS
20.0000 mg | ORAL_TABLET | Freq: Every day | ORAL | Status: DC
  Administered 2020-05-02: 20 mg via ORAL
  Filled 2020-05-02: qty 1

## 2020-05-02 NOTE — Discharge Summary (Signed)
Physician Discharge Summary  [name] ZOX:[id number] DOB: 1946-01-17 DOA: 05/01/2020  PCP: Alysia Penna, MD  Admit date: 05/01/2020 Discharge date: 05/02/2020  Time spent: 40 minutes  Recommendations for Outpatient Follow-up:  1. Follow outpatient CBC/CMP 2. Follow with surgery outpatient  3. Follow musculoskeletal pain outpatient  4. Follow t wave inversions outpatient, consider echo (defer to cardiology) 5. Follow prolonged QTC outpatient  6. RUQ US imaging concerning for cirrhosis, ? If imaging findings due to hemochromatosis - mildly low platelets, elevated bili today - follow outpatient with gastroenterology 7. Follow with heme/onc outpatient   Discharge Diagnoses:  Principal Problem:   RUQ pain Active Problems:   COPD (chronic obstructive pulmonary disease) (HCC)   Hemochromatosis   Pulmonary embolism (HCC)   Prolonged QT interval   T wave inversion in EKG   Discharge Condition: stable  Diet recommendation: heart healthy  There were no vitals filed for this visit.  History of present illness:   This is [name] 75 year old female with past medical history of cholelithiasis, GERD, IBS, COPD, hemochromatosis, polycythemia vera, lupus anticoagulant positive and history of PE on chronic anticoagulation who presented to the ED with abdominal pain.  She has had issues with RUQ and epigastric abdominal pain for the past 10 years and has been evaluated by surgery in the past with recommendations for watchful waiting.  Has not undergone [name] cholecystectomy as of yet.  She has had minimal improvement with NSAIDs at home. She has been having worsening pain as of lately as well and was evaluated by her PCP yesterday who ordered an RUQ [place] which showed 5 mm gallbladder wall thickening and positive Murphy sign and she was referred to the ED for further management. HIDA scan was negative for cholecystitis.  Pain at this point is thought due to musculoskeletal cause due to fall that occurred  about 3 weeks ago.  After discussion with patient/husband, pain started after this.  See below for additional details   Hospital Course:  1. RUQ pain  Gallstones  Musculoskeletal Pain after fall  1. Fall about 3 weeks ago, pain started after fall after further discussion today - mostly related to movement - not food [name]. RUQ [place] with cirrhosis, gallstones, 5 mm gallbladder wall thickening and positive murphy sign b. HIDA scan with patent biliary tree with mild hepatocellular dysfunction  c. Afebrile, no leukocytosis d. Surgery recommending no acute intervention - pain maybe musculoskeletal related to recent fall e. Plain film of ribs without acute or focal bony abnormality f. Try voltaren for suspected musculoskeletal pain g. Bili mildly elevated today, follow outpatient   2. T wave inversions [name]. Noted on multiple prior EKGs b. They showed me EKG from several years ago that was similar as well c. Follow outpatient with cardiology - will defer additional workup to them with echo, etc in absence of CP, SOB   3. Prolonged QTc [name]. Hold QT prolonging agents b. Follow outpatient   4. COPD, not in acute exacerbation [name]. Resume home meds at discharge  5. 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 Resume xarelto  b. Follow outpatient with heme onc c. RUQ US shows suggestion of cirrhosis on imaging - follow outpatient   Requested 2nd moderna booster today, given  03/24/19, 04/21/19, 09/13/19 (previous moderna shots) Procedures:  none  Consultations:  surgery  Discharge Exam: Vitals:   05/02/20 0817 05/02/20 1322  BP:  121/64  Pulse:  67  Resp:  15  Temp:  97.8 F (36.6 C)  SpO2: 90% 97%   C/o RUQ discomfort, pain Mostly with movement Not related to eating/drinking  [name] several weeks ago, pain started after that  General: No acute distress. Cardiovascular: Heart sounds show [name] regular rate, and rhythm. No  gallops or rubs. No murmurs. No JVD. Lungs: Clear to auscultation bilaterally with good air movement. No rales, rhonchi or wheezes. Abdomen: Soft, nondistended, RUQ ttp, negative murphy - pain more pronounced with palpation of lower ribs Neurological: Alert and oriented 3. Moves all extremities 4 with equal strength. Cranial nerves II through XII intact. Skin: Warm and dry. No rashes or lesions. Extremities: No clubbing or cyanosis. No edema  Discharge Instructions   Discharge Instructions    Call MD for:  difficulty breathing, headache or visual disturbances   Complete by: As directed    Call MD for:  extreme fatigue   Complete by: As directed    Call MD for:  hives   Complete by: As directed    Call MD for:  persistant dizziness or light-headedness   Complete by: As directed    Call MD for:  persistant nausea and vomiting   Complete by: As directed    Call MD for:  redness, tenderness, or signs of infection (pain, swelling, redness, odor or green/yellow discharge around incision site)   Complete by: As directed    Call MD for:  severe uncontrolled pain   Complete by: As directed    Call MD for:  temperature >100.4   Complete by: As directed    Diet - low sodium heart healthy   Complete by: As directed    Discharge instructions   Complete by: As directed    You were seen for right upper quadrant pain and concern for cholecystitis.  The HIDA scan was negative for cholecystitis.  Your pain may be more musculoskeletal related to your fall.    You'll need repeat labs and further evaluation outpatient.   Please follow up outpatient for continued follow up for your pain.   Your EKG is abnormal.  Follow up with your cardiologist for further recommendations, these changes don't appear to be new.    Return for new, recurrent, or worsening symptoms.  Please ask your PCP to request records from this hospitalization so they know what was done and what the next steps will be.    Increase activity slowly   Complete by: As directed      Allergies as of 05/02/2020      Reactions   Dulera [mometasone Furo-formoterol Fum] Cough      Medication List    TAKE these medications   albuterol 108 (90 Base) MCG/ACT inhaler Commonly known as: VENTOLIN HFA Inhale 2 puffs into the lungs every 4 (four) hours as needed for wheezing or shortness of breath.   budesonide-formoterol 80-4.5 MCG/ACT inhaler Commonly known as: SYMBICORT INHALE 2 PUFFS INTO THE LUNGS TWICE DAILY   dicyclomine 20 MG tablet Commonly known as: BENTYL Take 1 tablet by mouth daily as needed for spasms.   folic acid 1 MG tablet Commonly known as: FOLVITE TAKE 1 TABLET(1 MG) BY MOUTH DAILY   LORazepam 0.5 MG tablet Commonly known as: ATIVAN TAKE 1 TABLET BY MOUTH EVERY NIGHT AT BEDTIME   pantoprazole 40 MG tablet Commonly known as: PROTONIX Take 40 mg by mouth 2 (two) times daily.   propranolol 20 MG tablet Commonly known as: INDERAL Take 20 mg by mouth daily.  sertraline 100 MG tablet Commonly known as: ZOLOFT Take 100 mg by mouth at bedtime.   Stiolto Respimat 2.5-2.5 MCG/ACT Aers Generic drug: Tiotropium Bromide-Olodaterol Inhale 2 puffs into the lungs daily.   Vitamin D (Ergocalciferol) 1.25 MG (50000 UNIT) Caps capsule Commonly known as: DRISDOL Take 1 capsule (50,000 Units total) by mouth every 7 (seven) days. Sundays   Xarelto 20 MG Tabs tablet Generic drug: rivaroxaban TAKE 1 TABLET(20 MG) BY MOUTH DAILY WITH SUPPER      Allergies  Allergen Reactions  . Dulera [Mometasone Furo-Formoterol Fum] Cough      The results of significant diagnostics from this hospitalization (including imaging, microbiology, ancillary and laboratory) are listed below for reference.    Significant Diagnostic Studies: DG Chest 2 View  Result Date: 04/16/2020 CLINICAL DATA:  Thoracic spine pain since fall 2 weeks ago. EXAM: CHEST - 2 VIEW COMPARISON:  CT chest July 06, 2019 and chest  radiograph July 12, 2017. FINDINGS: Mild cardiac enlargement, similar prior. Mediastinal contours are unremarkable. Right middle lobe scarring. No new focal consolidation. Similar bibasilar pleural thickening. No pneumothorax. Similar mild anterior wedging of the T9 vertebral body with prior augmentation at T11 and T12. Unchanged T2 compression deformity. IMPRESSION: 1. No acute cardiopulmonary abnormality. 2. Right middle lobe scarring. 3. Similar appearance of the compression deformities and vertebral augmentation in the thoracolumbar spine. Electronically Signed   By: Maudry Mayhew MD   On: 04/16/2020 12:11   DG Ribs Unilateral W/Chest Right  Result Date: 05/02/2020 CLINICAL DATA:  Right-sided rib pain. EXAM: RIGHT RIBS AND CHEST - 3+ VIEW COMPARISON:  Chest x-ray 04/16/2020. FINDINGS: No acute cardiopulmonary disease. No acute or focal bony or joint abnormality. No evidence of fracture or dislocation. Lower thoracic vertebroplasties. IMPRESSION: 1.  No acute cardiopulmonary disease. 2. No acute or focal bony abnormality. Prior lower thoracic vertebroplasties. Electronically Signed   By: Maisie Fus  Register   On: 05/02/2020 13:34   DG Thoracic Spine 2 View  Addendum Date: 04/16/2020   ADDENDUM REPORT: 04/16/2020 12:10 ADDENDUM: Chronic compression deformity at L2 also appears similar to prior. Electronically Signed   By: Maudry Mayhew MD   On: 04/16/2020 12:10   Result Date: 04/16/2020 CLINICAL DATA:  Thoracic spine pain after fall 2 weeks ago. EXAM: THORACIC SPINE 2 VIEWS COMPARISON:  CT chest July 06, 2019 and chest radiograph July 12, 2017 FINDINGS: Again seen is previous spinal augmentation at T11 and T12. Mild T9 anterior compression deformity with approximately 20% height loss, this appears stable since July 12, 2017. No acute abnormalities in the thoracic spine. Alignment is normal. No other significant bone abnormalities are identified. IMPRESSION: Prior spinal augmentation at T11 and T12 with  unchanged mild T9 anterior compression deformity stable since at least July 12, 2017. No acute abnormality identified in the thoracic spine. Electronically Signed: By: Maudry Mayhew MD On: 04/16/2020 12:06   NM Hepatobiliary Liver Func  Result Date: 05/01/2020 CLINICAL DATA:  Chronic upper abdominal pain, known cholelithiasis EXAM: NUCLEAR MEDICINE HEPATOBILIARY IMAGING TECHNIQUE: Sequential images of the abdomen were obtained out to 60 minutes following intravenous administration of radiopharmaceutical. RADIOPHARMACEUTICALS:  5.4 mCi Tc-2m  Choletec IV COMPARISON:  None FINDINGS: Fairly good extraction of tracer from bloodstream. Prompt excretion of tracer into biliary tree. Gallbladder visualized at 13 minutes. At 1 hour, small-bowel had not visualized. Additional imaging over Davelle Anselmi second hour revealed visualization of the gallbladder at 77 minutes. Mild diffuse retained tracer within liver at conclusion of exam. IMPRESSION: Patent biliary tree. Mild  hepatocellular dysfunction. Electronically Signed   By: Ulyses Southward M.D.   On: 05/01/2020 18:35   CT Abdomen Pelvis W Contrast  Result Date: 04/16/2020 CLINICAL DATA:  Nonlocalized abdominal and back pain. Fall. On blood thinners. Fall 2 weeks ago. EXAM: CT ABDOMEN AND PELVIS WITH CONTRAST TECHNIQUE: Multidetector CT imaging of the abdomen and pelvis was performed using the standard protocol following bolus administration of intravenous contrast. CONTRAST:  OMNIPAQUE IOHEXOL 300 MG/ML  SOLN COMPARISON:  04/27/2016 renal ultrasound. 09/06/2015 abdominal ultrasound. FINDINGS: Lower chest: Bibasilar scarring. Normal heart size without pericardial or pleural effusion. Hepatobiliary: Advanced cirrhosis, as evidenced by irregular hepatic capsule, caudate lobe enlargement. No focal liver lesion. Multiple small gallstones without acute cholecystitis or biliary duct dilatation. Pancreas: Normal, without mass or ductal dilatation. Spleen: Normal in size, without  focal abnormality. Adrenals/Urinary Tract: Normal adrenal glands. Mild renal cortical thinning bilaterally. Interpolar left renal too small to characterize lesion. Normal right kidney. The bladder wall is mildly thickened and irregular including on 59/2. Stomach/Bowel: Normal stomach, without wall thickening. Normal colon and terminal ileum. Appendix not visualized. Normal small bowel. Vascular/Lymphatic: Patent hepatic portal, splenic veins. Aortic atherosclerosis. No abdominopelvic adenopathy. Reproductive: Normal uterus and adnexa. Other: No significant free fluid.  Mild pelvic floor laxity. Musculoskeletal: Osteopenia. Pelvic sclerotic lesions are likely bone islands. Vertebral augmentation at T11 and T12. Moderate compression deformity at L2 with mild ventral canal encroachment. Favored to be subacute. No paravertebral hematoma. IMPRESSION: 1. L2 compression deformity with mild ventral canal encroachment, favored to be subacute. 2. Advanced cirrhosis. 3. Cholelithiasis. 4. Bladder wall thickening and irregularity, suspicious for cystitis. Correlate with urinalysis. 5. Aortic Atherosclerosis (ICD10-I70.0). Electronically Signed   By: Jeronimo Greaves M.D.   On: 04/16/2020 13:57   US Abdomen Limited RUQ (LIVER/GB)  Result Date: 04/30/2020 CLINICAL DATA:  Right upper quadrant pain. EXAM: ULTRASOUND ABDOMEN LIMITED RIGHT UPPER QUADRANT COMPARISON:  CT 04/16/2020. FINDINGS: Gallbladder: Gallstones are noted. The largest measures 1.2 cm. Questionable tiny gallbladder polyp. Gallbladder wall thickening 5 mm. Positive Murphy sign. Cholecystitis cannot be excluded. Common bile duct: Diameter: 4.1 mm Liver: Nodular hepatic contour and heterogeneous hepatic parenchymal pattern suggesting cirrhosis. No focal hepatic abnormality identified. Portal vein is patent on color Doppler imaging with normal direction of blood flow towards the liver. Other: None. IMPRESSION: 1. Gallstones. Questionable tiny gallbladder polyp.  Gallbladder wall thickening 5 mm. Positive Murphy sign. Cholecystitis cannot be excluded. 2. Nodular hepatic contour and heterogeneous hepatic parenchymal pattern suggesting cirrhosis. Electronically Signed   By: Maisie Fus  Register   On: 04/30/2020 09:08    Microbiology: Recent Results (from the past 240 hour(s))  Resp Panel by RT-PCR (Flu [name]&B, Covid) Nasopharyngeal Swab     Status: None   Collection Time: 05/01/20  1:36 PM   Specimen: Nasopharyngeal Swab; Nasopharyngeal(NP) swabs in vial transport medium  Result Value Ref Range Status   SARS Coronavirus 2 by RT PCR NEGATIVE NEGATIVE Final    Comment: (NOTE) SARS-CoV-2 target nucleic acids are NOT DETECTED.  The SARS-CoV-2 RNA is generally detectable in upper respiratory specimens during the acute phase of infection. The lowest concentration of SARS-CoV-2 viral copies this assay can detect is 138 copies/mL. [name] negative result does not preclude SARS-Cov-2 infection and should not be used as the sole basis for treatment or other patient management decisions. [name] negative result may occur with  improper specimen collection/handling, submission of specimen other than nasopharyngeal swab, presence of viral mutation(s) within the areas targeted by this assay, and inadequate number of  viral copies(<138 copies/mL). [name] negative result must be combined with clinical observations, patient history, and epidemiological information. The expected result is Negative.  Fact Sheet for Patients:  BloggerCourse.com  Fact Sheet for Healthcare Providers:  SeriousBroker.it  This test is no t yet approved or cleared by the [place] FDA and  has been authorized for detection and/or diagnosis of SARS-CoV-2 by FDA under an Emergency Use Authorization (EUA). This EUA will remain  in effect (meaning this test can be used) for the duration of the COVID-19 declaration under Section 564(b)(1) of the Act,  21 U.S.C.section 360bbb-3(b)(1), unless the authorization is terminated  or revoked sooner.       Influenza [name] by PCR NEGATIVE NEGATIVE Final   Influenza B by PCR NEGATIVE NEGATIVE Final    Comment: (NOTE) The Xpert Xpress SARS-CoV-2/FLU/RSV plus assay is intended as an aid in the diagnosis of influenza from Nasopharyngeal swab specimens and should not be used as [name] sole basis for treatment. Nasal washings and aspirates are unacceptable for Xpert Xpress SARS-CoV-2/FLU/RSV testing.  Fact Sheet for Patients: BloggerCourse.com  Fact Sheet for Healthcare Providers: SeriousBroker.it  This test is not yet approved or cleared by the [place] FDA and has been authorized for detection and/or diagnosis of SARS-CoV-2 by FDA under an Emergency Use Authorization (EUA). This EUA will remain in effect (meaning this test can be used) for the duration of the COVID-19 declaration under Section 564(b)(1) of the Act, 21 U.S.C. section 360bbb-3(b)(1), unless the authorization is terminated or revoked.  Performed at [facility], [address] [place], [place] [address]      Labs: Basic Metabolic Panel: Recent Labs  Lab 05/01/20 1335 05/02/20 0320  NA 141 138  K 4.0 3.9  CL 105 101  CO2 28 28  GLUCOSE 102* 96  BUN 9 7*  CREATININE 0.60 0.64  CALCIUM 9.0 8.9   Liver Function Tests: Recent Labs  Lab 05/01/20 1335 05/02/20 0320  AST 44* 36  ALT 26 24  ALKPHOS 89 81  BILITOT 1.1 1.6*  PROT 7.1 6.7  ALBUMIN 3.5 3.3*   Recent Labs  Lab 05/01/20 1335  LIPASE 35   No results for input(s): AMMONIA in the last 168 hours. CBC: Recent Labs  Lab 05/01/20 1335 05/02/20 0320  WBC 5.8 5.9  NEUTROABS 4.0  --   HGB 12.9 12.8  HCT 39.7 39.3  MCV 91.3 91.8  PLT 146* 127*   Cardiac Enzymes: No results for input(s): CKTOTAL, CKMB, CKMBINDEX, TROPONINI in the last 168 hours. BNP: BNP (last 3 results) No  results for input(s): BNP in the last 8760 hours.  ProBNP (last 3 results) No results for input(s): PROBNP in the last 8760 hours.  CBG: No results for input(s): GLUCAP in the last 168 hours.     Signed:  Lacretia Nicks MD.  [facility] 05/02/2020, 1:53 PM

## 2020-05-02 NOTE — Progress Notes (Signed)
[facility] Progress Note     Subjective: CC-  Husband at bedside. Feeling well this morning. Denies any current abdominal pain at rest, although still sore with palpation.  Denies n/v. Passing flatus, last BM yesterday morning. WBC 5.9, afebrile. Tbili 1.6 otherwise LFTs WNL.  Objective: Vital signs in last 24 hours: Temp:  [97.7 F (36.5 C)-98.3 F (36.8 C)] 98.2 F (36.8 C) (04/07 0420) Pulse Rate:  [66-72] 67 (04/07 0420) Resp:  [13-20] 20 (04/07 0420) BP: (112-158)/(59-78) 112/63 (04/07 0420) SpO2:  [90 %-98 %] 90 % (04/07 0817) Last BM Date: 05/01/20  Intake/Output from previous day: 04/06 0701 - 04/07 0700 In: 53.2 [I.V.:53.2] Out: -  Intake/Output this shift: No intake/output data recorded.  PE: Gen:  Alert, NAD, pleasant Pulm: rate and effort normal Abd: soft, ND, +BS, no masses, hernias, or organomegaly. Mild TTP RUQ without peritonitis Skin: no rashes noted, warm and dry  Lab Results:  Recent Labs    05/01/20 1335 05/02/20 0320  WBC 5.8 5.9  HGB 12.9 12.8  HCT 39.7 39.3  PLT 146* 127*   BMET Recent Labs    05/01/20 1335 05/02/20 0320  NA 141 138  K 4.0 3.9  CL 105 101  CO2 28 28  GLUCOSE 102* 96  BUN 9 7*  CREATININE 0.60 0.64  CALCIUM 9.0 8.9   PT/INR No results for input(s): LABPROT, INR in the last 72 hours. CMP     Component Value Date/Time   NA 138 05/02/2020 0320   NA 144 01/20/2017 1344   NA 138 01/17/2016 1356   K 3.9 05/02/2020 0320   K 3.6 01/20/2017 1344   K 4.1 01/17/2016 1356   CL 101 05/02/2020 0320   CL 107 01/20/2017 1344   CO2 28 05/02/2020 0320   CO2 25 01/20/2017 1344   CO2 25 01/17/2016 1356   GLUCOSE 96 05/02/2020 0320   GLUCOSE 108 01/20/2017 1344   BUN 7 (L) 05/02/2020 0320   BUN 12 01/20/2017 1344   BUN 12.3 01/17/2016 1356   CREATININE 0.64 05/02/2020 0320   CREATININE 0.67 04/01/2020 1118   CREATININE 0.7 01/20/2017 1344   CREATININE 0.8 01/17/2016 1356   CALCIUM 8.9 05/02/2020 0320    CALCIUM 8.9 01/20/2017 1344   CALCIUM 9.1 01/17/2016 1356   PROT 6.7 05/02/2020 0320   PROT 6.5 01/20/2017 1344   PROT 7.0 01/17/2016 1356   ALBUMIN 3.3 (L) 05/02/2020 0320   ALBUMIN 3.1 (L) 01/20/2017 1344   ALBUMIN 3.7 07/08/2016 1306   ALBUMIN 3.5 01/17/2016 1356   AST 36 05/02/2020 0320   AST 47 (H) 04/01/2020 1118   AST 48 (H) 01/17/2016 1356   ALT 24 05/02/2020 0320   ALT 30 04/01/2020 1118   ALT 49 (H) 01/20/2017 1344   ALT 38 01/17/2016 1356   ALKPHOS 81 05/02/2020 0320   ALKPHOS 68 01/20/2017 1344   ALKPHOS 87 01/17/2016 1356   BILITOT 1.6 (H) 05/02/2020 0320   BILITOT 1.0 04/01/2020 1118   BILITOT 0.67 01/17/2016 1356   GFRNONAA >60 05/02/2020 0320   GFRNONAA >60 04/01/2020 1118   GFRAA >60 10/09/2019 1339   Lipase     Component Value Date/Time   LIPASE 35 05/01/2020 1335       Studies/Results: NM Hepatobiliary Liver Func  Result Date: 05/01/2020 CLINICAL DATA:  Chronic upper abdominal pain, known cholelithiasis EXAM: NUCLEAR MEDICINE HEPATOBILIARY IMAGING TECHNIQUE: Sequential images of the abdomen were obtained out to 60 minutes following intravenous administration of radiopharmaceutical. RADIOPHARMACEUTICALS:  5.4 mCi Tc-73m  Choletec IV COMPARISON:  None FINDINGS: Fairly good extraction of tracer from bloodstream. Prompt excretion of tracer into biliary tree. Gallbladder visualized at 13 minutes. At 1 hour, small-bowel had not visualized. Additional imaging over a second hour revealed visualization of the gallbladder at 77 minutes. Mild diffuse retained tracer within liver at conclusion of exam. IMPRESSION: Patent biliary tree. Mild hepatocellular dysfunction. Electronically Signed   By: Lavonia Dana M.D.   On: 05/01/2020 18:35    Anti-infectives: Anti-infectives (From admission, onward)   Start     Dose/Rate Route Frequency Ordered Stop   05/01/20 2359  piperacillin-tazobactam (ZOSYN) IVPB 3.375 g  Status:  Discontinued        3.375 g 12.5 mL/hr over 240  Minutes Intravenous Every 8 hours 05/01/20 1553 05/01/20 1740   05/01/20 1600  piperacillin-tazobactam (ZOSYN) IVPB 3.375 g  Status:  Discontinued        3.375 g 100 mL/hr over 30 Minutes Intravenous  Once 05/01/20 1552 05/01/20 1745       Assessment/Plan Hx PE/DVT 02/2019 - on xarelto (last dose 04/30/20 in PM) Lupus anticoagulant - followed by Dr. Marin Olp  Polycythemia vera Hemachromatosis  COPD - followed by Dr. Lamonte Sakai GERD CKD Mitochondrial myopathy Cirrhosis - on imaging Fall 04/16/20 - below mentioned symptoms worse 2 days after the fall  RUQ pain Gallstones - u/s reports cirrhosis, gallstones, questionable tiny gallbladder polyp, gallbladder wall thickening 5 mm, positive Murphy sign  - symptoms not typical for biliary colic/ cholecystitis - HIDA shows patent biliary tree, no acute cholecystitis, mild hepatocellular dysfunction - With a normal HIDA scan would not recommend cholecystectomy this admission. Pain could be musculoskeletal? Further work up per primary. [place] for discharge from surgical standpoint. Patient has seen Dr. Barry Dienes in our office in the past regarding gallstones, ok to follow up with her as needed.   ID - hold on antibiotics given no leukocytosis and she is afebrile VTE - IV heparin - ok to restart xarelto FEN - reg diet Foley - none Follow up - Dr. Barry Dienes PRN   LOS: 0 days    Wellington Hampshire, [facility] 05/02/2020, 8:57 AM Please see Amion for pager number during day hours 7:00am-4:30pm

## 2020-05-02 NOTE — Progress Notes (Signed)
[place] for Heparin Indication: pulmonary embolus and DVT, Lupus anticoagulant  Allergies  Allergen Reactions  . [facility], [facility], [facility] [Mometasone Furo-Formoterol Fum] Cough    Patient Measurements:   Heparin Dosing Weight: 61.8 kg  Vital Signs: Temp: 98.2 F (36.8 C) (04/07 0420) Temp Source: Oral (04/07 0420) BP: 112/63 (04/07 0420) Pulse Rate: 67 (04/07 0420)  Labs: Recent Labs    05/01/20 1335 05/01/20 1832 05/02/20 0320  HGB 12.9  --  12.8  HCT 39.7  --  39.3  PLT 146*  --  127*  APTT  --  31 80*  HEPARINUNFRC  --   --  0.51  CREATININE 0.60  --  0.64   Estimated Creatinine Clearance: 49.5 mL/min (by C-G formula based on SCr of 0.64 mg/dL).  Medical History: Past Medical History:  Diagnosis Date  . Allergy   . Anemia   . Anxiety   . Asthma    DR. BYRUM  . Blood transfusion without reported diagnosis   . Cataract   . Cholelithiasis   . Chronic kidney disease    gallstones   . Compression fracture of body of thoracic vertebra (HCC)   . Depression   . Dyspnea   . Dysrhythmia    palpitations  . GERD (gastroesophageal reflux disease)   . Heart attack ([place])   . Hemorrhoids   . IBS (irritable bowel syndrome)   . Iliotibial band syndrome    left knee  . Migraine   . Mitochondrial myopathy   . Normal coronary arteries    by cardiac catheterization performed by myself 02/25/01  . Osteoporosis   . Pericarditis    age 75  . Pneumothorax   . 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 PVC's (premature ventricular contractions)   . Vitamin D deficiency    Medications:  Scheduled:  . arformoterol  15 mcg Nebulization BID  . LORazepam  0.5 mg Oral QHS  . propranolol  20 mg Oral BID  . umeclidinium bromide  1 puff Inhalation Daily   Infusions:  . heparin 900 Units/hr (05/01/20 2108)    Assessment: 75 yoF with RUQ pain, poss cholecystitis - hx gallstones, but sx atypical for GB PMH: Lupus anticoagulant +, PE/DVT Feb 2021 on  Xarelto 20mg  qd - last dose 4/5 at 6pm, CKD  Begin Heparin infusion for bridging for possible surgery.  HIDA scan performed Baseline aPTT 31 seconds  05/02/2020  APTT 80 therapeutic HL 0.51 Hgb 12.8,Plts 127 No bleeding or interruptions noted    Goal of Therapy:  Heparin level 0.3-0.7 units/ml aPTT 66-102 seconds Monitor platelets by anticoagulation protocol: Yes   Plan:  Continue Heparin infusion at 900 units/hr Check 8 hr Hep level and aPTT Monitor for s/s Bleed  Dolly Rias RPh 05/02/2020, 4:36 AM

## 2020-05-13 ENCOUNTER — Telehealth: Payer: Self-pay | Admitting: Emergency Medicine

## 2020-05-13 MED ORDER — STIOLTO RESPIMAT 2.5-2.5 MCG/ACT IN AERS: 2.0000 | INHALATION_SPRAY | Freq: Every day | RESPIRATORY_TRACT | 6 refills | Status: DC

## 2020-05-13 NOTE — Telephone Encounter (Signed)
Called and spoke with pt and she stated that the sample of the stiolto that was given earlier this month has really helped her.  Refill of this has been sent to the pharmacy and pt is aware.

## 2020-05-23 ENCOUNTER — Telehealth: Payer: Self-pay | Admitting: Emergency Medicine

## 2020-05-23 NOTE — Telephone Encounter (Signed)
She is correct that she does have and has had asthma, but when patient's have asthma for many years there are changes that evolve that start behaving like COPD as well. That's why I changed the medication, because her airway obstruction has changed over the years.   If the Stiolto is difficult to take due to the metallic taste then we can consider changing it back, especially if she doesn;t think it is helping her breathing more/

## 2020-05-23 NOTE — Telephone Encounter (Signed)
Spoke with the pt  She states that [place] helps her breathing but has caused her to have metallic taste in her mouth  She went online to read about potential s/e and states she read that this med is for COPD and not Asthma  She states that she was always told she had asthma  In her chart her dx is listed as COPD  She is wanting to know if this is correct and why RB thinks med is appropriate for her  Please advise thanks

## 2020-05-23 NOTE — Telephone Encounter (Signed)
I have called the pt and she is aware of RB recs.  She stated that she wants to think about what he said and she will call us back in a few days.  Will sign off of message.

## 2020-05-31 ENCOUNTER — Inpatient Hospital Stay: Payer: Medicare Other

## 2020-05-31 ENCOUNTER — Inpatient Hospital Stay: Payer: Medicare Other | Admitting: Family

## 2020-06-13 ENCOUNTER — Encounter: Payer: Self-pay | Admitting: Cardiology

## 2020-06-13 ENCOUNTER — Ambulatory Visit: Payer: Medicare Other | Admitting: Cardiology

## 2020-06-13 NOTE — Progress Notes (Deleted)
Cardiology Office Note   Date:  06/13/2020   ID:  [name], DOB Dec 07, 1945, MRN [id number]  PCP:  Velna Hatchet, MD  Cardiologist:   None Referring:  ***  No chief complaint on file.     History of Present Illness: [name] is a 75 y.o. female who is referred by *** for evaluation of ***  She was in the hospital in April for evaluation of abdominal pain.  She was mentioned to have a prolonged QT.  She has a history of pericarditis years ago.  She has had PVCs listed on her problem list.  She also has a history of DVT and PE in Feb 2021 and has polycythemia vera, lupus anticoagulant and hemochromatosis.  She is on chronic anticoagulation.      5. 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 Allergy   . Anemia   . Anxiety   . Asthma    DR. BYRUM  . Blood transfusion without reported diagnosis   . Cataract   . Cholelithiasis   . Chronic kidney disease    gallstones   . Compression fracture of body of thoracic vertebra (HCC)   . Depression   . Dyspnea   . Dysrhythmia    palpitations  . GERD (gastroesophageal reflux disease)   . Heart attack ([place])   . Hemorrhoids   . IBS (irritable bowel syndrome)   . Iliotibial band syndrome    left knee  . Migraine   . Mitochondrial myopathy   . Normal coronary arteries    by cardiac catheterization performed by myself 02/25/01  . Osteoporosis   . Pericarditis    age 22  . Pneumothorax   . 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 PVC's (premature ventricular contractions)   . Vitamin D deficiency     Past Surgical History:  Procedure Laterality Date  . APPENDECTOMY    . CARDIAC CATHETERIZATION    . CATARACT EXTRACTION W/ INTRAOCULAR LENS  IMPLANT, BILATERAL    . CESAREAN SECTION     x 1  . COLONOSCOPY    . DILATATION & CURETTAGE/HYSTEROSCOPY WITH MYOSURE N/A 10/29/2014   Procedure: DILATATION &  CURETTAGE/HYSTEROSCOPY WITH MYOSURE;  Surgeon: Paula Compton, MD;  Location: [facility];  Service: Gynecology;  Laterality: N/A;  . IR FLUORO GUIDED NEEDLE PLC ASPIRATION/INJECTION LOC  06/10/2016  . IR FLUORO GUIDED NEEDLE PLC ASPIRATION/INJECTION LOC  06/10/2016  . KYPHOPLASTY N/A 05/14/2016   Procedure: T 11 KYPHOPLASTY;  Surgeon: Phylliss Bob, MD;  Location: [facility];  Service: Orthopedics;  Laterality: N/A;  T 11 KYPHOPLASTY  . KYPHOPLASTY N/A 06/18/2016   Procedure: THORACIC 12 KYPHOPLASTY;  Surgeon: Phylliss Bob, MD;  Location: [facility];  Service: Orthopedics;  Laterality: N/A;  THORACIC 12 KYPHOPLASTY; REQUEST 1 HOUR AND FLIP ROOM  . MOUTH SURGERY    . NASAL SINUS SURGERY    . NM MYOCAR PERF WALL MOTION  04/27/2006   normal  . US ECHOCARDIOGRAPHY  12/25/2010   normal  . VARICOSE VEIN SURGERY       Current Outpatient Medications  Medication Sig Dispense Refill  . albuterol (VENTOLIN HFA) 108 (90 Base) MCG/ACT inhaler Inhale 2 puffs into the lungs every 4 (four) hours as needed for wheezing or shortness of breath. 8 g 2  . dicyclomine (BENTYL) 20 MG tablet Take 1 tablet by mouth daily as needed for spasms.    Marland Kitchen  folic acid (FOLVITE) 1 MG tablet TAKE 1 TABLET(1 MG) BY MOUTH DAILY 30 tablet 11  . LORazepam (ATIVAN) 0.5 MG tablet TAKE 1 TABLET BY MOUTH EVERY NIGHT AT BEDTIME 30 tablet 0  . pantoprazole (PROTONIX) 40 MG tablet Take 40 mg by mouth 2 (two) times daily.    . propranolol (INDERAL) 20 MG tablet Take 20 mg by mouth daily.    . sertraline (ZOLOFT) 100 MG tablet Take 100 mg by mouth at bedtime.  0  . Tiotropium Bromide-Olodaterol (STIOLTO RESPIMAT) 2.5-2.5 MCG/ACT AERS Inhale 2 puffs into the lungs daily. 4 g 6  . Vitamin D, Ergocalciferol, (DRISDOL) 50000 units CAPS capsule Take 1 capsule (50,000 Units total) by mouth every 7 (seven) days. Sundays 30 capsule 3  . XARELTO 20 MG TABS tablet TAKE 1 TABLET(20 MG) BY MOUTH DAILY WITH SUPPER 30 tablet 4   No current facility-administered  medications for this visit.    Allergies:   Dulera [mometasone furo-formoterol fum]    Social History:  The patient  reports that she has never smoked. She has never used smokeless tobacco. She reports current alcohol use of about 2.0 standard drinks of alcohol per week. She reports that she does not use drugs.   Family History:  The patient's ***family history includes Alcohol abuse in her father; Arthritis in her mother; Asthma in her mother; Depression in her mother; Emphysema in her father; Heart attack in her father; Lung cancer in her father; Stroke in her father.    ROS:  Please see the history of present illness.   Otherwise, review of systems are positive for {NONE DEFAULTED:18576::"none"}.   All other systems are reviewed and negative.    PHYSICAL EXAM: VS:  There were no vitals taken for this visit. , BMI There is no height or weight on file to calculate BMI. GENERAL:  Well appearing HEENT:  Pupils equal round and reactive, fundi not visualized, oral mucosa unremarkable NECK:  No jugular venous distention, waveform within normal limits, carotid upstroke brisk and symmetric, no bruits, no thyromegaly LYMPHATICS:  No cervical, inguinal adenopathy LUNGS:  Clear to auscultation bilaterally BACK:  No CVA tenderness CHEST:  Unremarkable HEART:  PMI not displaced or sustained,S1 and S2 within normal limits, no S3, no S4, no clicks, no rubs, *** murmurs ABD:  Flat, positive bowel sounds normal in frequency in pitch, no bruits, no rebound, no guarding, no midline pulsatile mass, no hepatomegaly, no splenomegaly EXT:  2 plus pulses throughout, no edema, no cyanosis no clubbing SKIN:  No rashes no nodules NEURO:  Cranial nerves II through XII grossly intact, motor grossly intact throughout PSYCH:  Cognitively intact, oriented to person place and time    EKG:  EKG {ACTION; IS/IS PIR:51884166} ordered today. The ekg ordered today demonstrates ***   Recent Labs: 04/16/2020: Magnesium  1.7 05/02/2020: ALT 24; BUN 7; Creatinine, Ser 0.64; Hemoglobin 12.8; Platelets 127; Potassium 3.9; Sodium 138    Lipid Panel    Component Value Date/Time   CHOL 215 (H) 07/27/2008 2110   TRIG 90 07/27/2008 2110   HDL 81 07/27/2008 2110   CHOLHDL 2.7 Ratio 07/27/2008 2110   VLDL 18 07/27/2008 2110   LDLCALC 116 (H) 07/27/2008 2110      Wt Readings from Last 3 Encounters:  04/29/20 136 lb 3.2 oz (61.8 kg)  04/16/20 134 lb 0.6 oz (60.8 kg)  04/01/20 134 lb (60.8 kg)      Other studies Reviewed: Additional studies/ records that were reviewed today include: ***.  Review of the above records demonstrates:  Please see elsewhere in the note.  ***   ASSESSMENT AND PLAN:  HISTORY OF PE:  PVCs:  ***   Current medicines are reviewed at length with the patient today.  The patient {ACTIONS; HAS/DOES NOT HAVE:19233} concerns regarding medicines.  The following changes have been made:  {PLAN; NO CHANGE:13088:s}  Labs/ tests ordered today include: *** No orders of the defined types were placed in this encounter.    Disposition:   FU with ***    Signed, Minus Breeding, MD  06/13/2020 12:49 PM    [facility]

## 2020-06-14 ENCOUNTER — Ambulatory Visit: Payer: Medicare Other | Admitting: Cardiology

## 2020-06-17 ENCOUNTER — Ambulatory Visit: Payer: Medicare Other | Admitting: Gastroenterology

## 2020-06-18 ENCOUNTER — Inpatient Hospital Stay: Payer: Medicare Other

## 2020-06-18 ENCOUNTER — Encounter: Payer: Self-pay | Admitting: Family

## 2020-06-18 ENCOUNTER — Inpatient Hospital Stay (HOSPITAL_BASED_OUTPATIENT_CLINIC_OR_DEPARTMENT_OTHER): Payer: Medicare Other | Admitting: Family

## 2020-06-18 ENCOUNTER — Other Ambulatory Visit: Payer: Self-pay

## 2020-06-18 ENCOUNTER — Inpatient Hospital Stay: Payer: Medicare Other | Attending: Hematology & Oncology

## 2020-06-18 VITALS — BP 133/63 | HR 67 | Temp 97.8°F | Resp 19 | Ht 63.0 in | Wt 134.0 lb

## 2020-06-18 DIAGNOSIS — G713 Mitochondrial myopathy, not elsewhere classified: Secondary | ICD-10-CM | POA: Insufficient documentation

## 2020-06-18 DIAGNOSIS — I2699 Other pulmonary embolism without acute cor pulmonale: Secondary | ICD-10-CM | POA: Diagnosis not present

## 2020-06-18 DIAGNOSIS — E7211 Homocystinuria: Secondary | ICD-10-CM

## 2020-06-18 DIAGNOSIS — Z86718 Personal history of other venous thrombosis and embolism: Secondary | ICD-10-CM | POA: Insufficient documentation

## 2020-06-18 DIAGNOSIS — R760 Raised antibody titer: Secondary | ICD-10-CM | POA: Diagnosis not present

## 2020-06-18 DIAGNOSIS — D45 Polycythemia vera: Secondary | ICD-10-CM | POA: Diagnosis not present

## 2020-06-18 DIAGNOSIS — D509 Iron deficiency anemia, unspecified: Secondary | ICD-10-CM | POA: Diagnosis not present

## 2020-06-18 DIAGNOSIS — M810 Age-related osteoporosis without current pathological fracture: Secondary | ICD-10-CM | POA: Insufficient documentation

## 2020-06-18 DIAGNOSIS — I82422 Acute embolism and thrombosis of left iliac vein: Secondary | ICD-10-CM

## 2020-06-18 DIAGNOSIS — Z7901 Long term (current) use of anticoagulants: Secondary | ICD-10-CM | POA: Diagnosis not present

## 2020-06-18 DIAGNOSIS — D751 Secondary polycythemia: Secondary | ICD-10-CM

## 2020-06-18 LAB — CBC WITH DIFFERENTIAL (CANCER CENTER ONLY)
Abs Immature Granulocytes: 0.03 10*3/uL (ref 0.00–0.07)
Basophils Absolute: 0 10*3/uL (ref 0.0–0.1)
Basophils Relative: 1 %
Eosinophils Absolute: 0.1 10*3/uL (ref 0.0–0.5)
Eosinophils Relative: 2 %
HCT: 41.7 % (ref 36.0–46.0)
Hemoglobin: 13.7 g/dL (ref 12.0–15.0)
Immature Granulocytes: 1 %
Lymphocytes Relative: 17 %
Lymphs Abs: 1.1 10*3/uL (ref 0.7–4.0)
MCH: 29.5 pg (ref 26.0–34.0)
MCHC: 32.9 g/dL (ref 30.0–36.0)
MCV: 89.7 fL (ref 80.0–100.0)
Monocytes Absolute: 0.7 10*3/uL (ref 0.1–1.0)
Monocytes Relative: 12 %
Neutro Abs: 4.3 10*3/uL (ref 1.7–7.7)
Neutrophils Relative %: 67 %
Platelet Count: 145 10*3/uL — ABNORMAL LOW (ref 150–400)
RBC: 4.65 MIL/uL (ref 3.87–5.11)
RDW: 14.4 % (ref 11.5–15.5)
WBC Count: 6.3 10*3/uL (ref 4.0–10.5)
nRBC: 0 % (ref 0.0–0.2)

## 2020-06-18 LAB — CMP (CANCER CENTER ONLY)
ALT: 28 U/L (ref 0–44)
AST: 45 U/L — ABNORMAL HIGH (ref 15–41)
Albumin: 3.8 g/dL (ref 3.5–5.0)
Alkaline Phosphatase: 73 U/L (ref 38–126)
Anion gap: 9 (ref 5–15)
BUN: 12 mg/dL (ref 8–23)
CO2: 29 mmol/L (ref 22–32)
Calcium: 9.7 mg/dL (ref 8.9–10.3)
Chloride: 97 mmol/L — ABNORMAL LOW (ref 98–111)
Creatinine: 0.68 mg/dL (ref 0.44–1.00)
GFR, Estimated: >60 mL/min (ref >60)
Glucose, Bld: 93 mg/dL (ref 70–99)
Potassium: 4 mmol/L (ref 3.5–5.1)
Sodium: 135 mmol/L (ref 135–145)
Total Bilirubin: 0.8 mg/dL (ref 0.3–1.2)
Total Protein: 6.8 g/dL (ref 6.5–8.1)

## 2020-06-18 NOTE — Progress Notes (Signed)
Hematology and Oncology Follow Up Visit  [name] [id number] 01/03/1946 75 y.o. 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 She is having pain in her shoulders and back. She states that she feels this may be due to arthritis.  She notes fatigue.  She has SOB and chest discomfort at times with exertion. She takes a break to rest as needed.  She recently got a new inhaler and feels that this seems to have helped. She was also recently diagnosed with COPD by her pulmonologist.  No fever, chills, n/v, rash, dizziness, palpitations, abdominal pain or changes in bowel or bladder habits.  She is still having diarrhea daily.  No blood loss noted. No bruising or petechiae.  No swelling, tenderness, numbness or tingling in her extremities.  No falls or syncope.  She has maintained a good appetite and is staying well hydrated. Her weight is stable at 134 lbs.   ECOG Performance Status: 1 - Symptomatic but completely ambulatory  Medications:  Allergies as of 06/18/2020      Reactions   Dulera [mometasone Furo-formoterol Fum] Cough      Medication List       Accurate as of Jun 18, 2020 12:05 PM. If you have any questions, ask your nurse or doctor.        albuterol 108 (90 Base) MCG/ACT inhaler Commonly known as: VENTOLIN HFA Inhale 2 puffs into the lungs every 4 (four) hours as needed for wheezing or shortness of breath.   dicyclomine 20 MG  tablet Commonly known as: BENTYL Take 1 tablet by mouth daily as needed for spasms.   folic acid 1 MG tablet Commonly known as: FOLVITE TAKE 1 TABLET(1 MG) BY MOUTH DAILY   LORazepam 0.5 MG tablet Commonly known as: ATIVAN TAKE 1 TABLET BY MOUTH EVERY NIGHT AT BEDTIME   pantoprazole 40 MG tablet Commonly known as: PROTONIX Take 40 mg by mouth 2 (two) times daily.   propranolol 20 MG tablet Commonly known as: INDERAL Take 20 mg by mouth daily.   sertraline 100 MG tablet Commonly known as: ZOLOFT Take 100 mg by mouth at bedtime.   Stiolto Respimat 2.5-2.5 MCG/ACT Aers Generic drug: Tiotropium Bromide-Olodaterol Inhale 2 puffs into the lungs daily.   Vitamin D (Ergocalciferol) 1.25 MG (50000 UNIT) Caps capsule Commonly known as: DRISDOL Take 1 capsule (50,000 Units total) by mouth every 7 (seven) days. Sundays   Xarelto 20 MG Tabs tablet Generic drug: rivaroxaban TAKE 1 TABLET(20 MG) BY MOUTH DAILY WITH SUPPER       Allergies:  Allergies  Allergen Reactions  . Dulera [Mometasone Furo-Formoterol Fum] Cough    Past Medical History, Surgical history, Social history, and Family History were reviewed and updated.  Review of Systems: All other 10 point review of systems is negative.   Physical Exam:  vitals were not taken for this visit.   Wt Readings from Last 3 Encounters:  04/29/20 136 lb 3.2 oz (61.8 kg)  04/16/20 134 lb 0.6 oz (60.8 kg)  04/01/20 134 lb (  60.8 kg)    Ocular: Sclerae unicteric, pupils equal, round and reactive to light Ear-nose-throat: Oropharynx clear, dentition fair Lymphatic: No cervical or supraclavicular adenopathy Lungs no rales or rhonchi, good excursion bilaterally Heart regular rate and rhythm, no murmur appreciated Abd soft, nontender, positive bowel sounds MSK no focal spinal tenderness, no joint edema Neuro: non-focal, well-oriented, appropriate affect Breasts: Deferred   Lab Results  Component Value Date   WBC 6.3  06/18/2020   HGB 13.7 06/18/2020   HCT 41.7 06/18/2020   MCV 89.7 06/18/2020   PLT 145 (L) 06/18/2020   Lab Results  Component Value Date   FERRITIN 28 04/01/2020   IRON 83 04/01/2020   TIBC 406 04/01/2020   UIBC 323 04/01/2020   IRONPCTSAT 20 (L) 04/01/2020   Lab Results  Component Value Date   RETICCTPCT 1.5 06/12/2014   RBC 4.65 06/18/2020   RETICCTABS 65.9 06/12/2014   Lab Results  Component Value Date   KAPLAMBRATIO 0.91 04/27/2016   Lab Results  Component Value Date   IGGSERUM 897 04/27/2016   IGA 339 02/27/2014   IGMSERUM 132 04/27/2016   Lab Results  Component Value Date   MSPIKE Not Observed 04/27/2016     Chemistry      Component Value Date/Time   NA 138 05/02/2020 0320   NA 144 01/20/2017 1344   NA 138 01/17/2016 1356   K 3.9 05/02/2020 0320   K 3.6 01/20/2017 1344   K 4.1 01/17/2016 1356   CL 101 05/02/2020 0320   CL 107 01/20/2017 1344   CO2 28 05/02/2020 0320   CO2 25 01/20/2017 1344   CO2 25 01/17/2016 1356   BUN 7 (L) 05/02/2020 0320   BUN 12 01/20/2017 1344   BUN 12.3 01/17/2016 1356   CREATININE 0.64 05/02/2020 0320   CREATININE 0.67 04/01/2020 1118   CREATININE 0.7 01/20/2017 1344   CREATININE 0.8 01/17/2016 1356      Component Value Date/Time   CALCIUM 8.9 05/02/2020 0320   CALCIUM 8.9 01/20/2017 1344   CALCIUM 9.1 01/17/2016 1356   ALKPHOS 81 05/02/2020 0320   ALKPHOS 68 01/20/2017 1344   ALKPHOS 87 01/17/2016 1356   AST 36 05/02/2020 0320   AST 47 (H) 04/01/2020 1118   AST 48 (H) 01/17/2016 1356   ALT 24 05/02/2020 0320   ALT 30 04/01/2020 1118   ALT 49 (H) 01/20/2017 1344   ALT 38 01/17/2016 1356   BILITOT 1.6 (H) 05/02/2020 0320   BILITOT 1.0 04/01/2020 1118   BILITOT 0.67 01/17/2016 1356       Impression and Plan: [name] is a very pleasant 75yo caucasian female withpolycythemia, 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 She had a mildly elevated homocystine level and positive lupus anticoagulant. No changes to Xarelto regimen.  No phlebotomy needed, Hct 41.7%.  Zometa due again in July 2022.  Follow-up in 8 weeks.  She can contact our office with any questions or concerns.   Laverna Peace, NP 5/24/202212:05 PM

## 2020-06-19 ENCOUNTER — Encounter: Payer: Self-pay | Admitting: Family

## 2020-06-19 ENCOUNTER — Telehealth: Payer: Self-pay | Admitting: *Deleted

## 2020-06-19 LAB — IRON AND TIBC
Iron: 63 ug/dL (ref 41–142)
Saturation Ratios: 16 % — ABNORMAL LOW (ref 21–57)
TIBC: 390 ug/dL (ref 236–444)
UIBC: 327 ug/dL (ref 120–384)

## 2020-06-19 LAB — FERRITIN: Ferritin: 27 ng/mL (ref 11–307)

## 2020-06-19 LAB — HOMOCYSTEINE: Homocysteine: 12.9 umol/L (ref 0.0–19.2)

## 2020-06-19 LAB — LUPUS ANTICOAGULANT PANEL
DRVVT: 39.6 s (ref 0.0–47.0)
PTT Lupus Anticoagulant: 34.9 s (ref 0.0–51.9)

## 2020-06-19 NOTE — Telephone Encounter (Signed)
Per 06/18/20 los - called and gave upcoming appointments - mailed calendar

## 2020-06-22 ENCOUNTER — Other Ambulatory Visit: Payer: Self-pay | Admitting: Family

## 2020-06-22 DIAGNOSIS — I82412 Acute embolism and thrombosis of left femoral vein: Secondary | ICD-10-CM

## 2020-06-22 DIAGNOSIS — I2699 Other pulmonary embolism without acute cor pulmonale: Secondary | ICD-10-CM

## 2020-08-13 ENCOUNTER — Other Ambulatory Visit: Payer: Self-pay

## 2020-08-13 ENCOUNTER — Inpatient Hospital Stay: Payer: Medicare Other

## 2020-08-13 ENCOUNTER — Inpatient Hospital Stay: Payer: Medicare Other | Attending: Hematology & Oncology

## 2020-08-13 ENCOUNTER — Encounter: Payer: Self-pay | Admitting: Family

## 2020-08-13 ENCOUNTER — Inpatient Hospital Stay (HOSPITAL_BASED_OUTPATIENT_CLINIC_OR_DEPARTMENT_OTHER): Payer: Medicare Other | Admitting: Family

## 2020-08-13 ENCOUNTER — Telehealth: Payer: Self-pay | Admitting: *Deleted

## 2020-08-13 VITALS — BP 133/67 | HR 76 | Temp 98.1°F | Resp 19 | Wt 133.1 lb

## 2020-08-13 DIAGNOSIS — E7211 Homocystinuria: Secondary | ICD-10-CM | POA: Diagnosis not present

## 2020-08-13 DIAGNOSIS — R760 Raised antibody titer: Secondary | ICD-10-CM

## 2020-08-13 DIAGNOSIS — G713 Mitochondrial myopathy, not elsewhere classified: Secondary | ICD-10-CM | POA: Insufficient documentation

## 2020-08-13 DIAGNOSIS — I2699 Other pulmonary embolism without acute cor pulmonale: Secondary | ICD-10-CM | POA: Diagnosis not present

## 2020-08-13 DIAGNOSIS — M8008XG Age-related osteoporosis with current pathological fracture, vertebra(e), subsequent encounter for fracture with delayed healing: Secondary | ICD-10-CM

## 2020-08-13 DIAGNOSIS — M810 Age-related osteoporosis without current pathological fracture: Secondary | ICD-10-CM | POA: Diagnosis not present

## 2020-08-13 DIAGNOSIS — I82422 Acute embolism and thrombosis of left iliac vein: Secondary | ICD-10-CM | POA: Diagnosis not present

## 2020-08-13 DIAGNOSIS — D751 Secondary polycythemia: Secondary | ICD-10-CM

## 2020-08-13 DIAGNOSIS — D45 Polycythemia vera: Secondary | ICD-10-CM | POA: Diagnosis not present

## 2020-08-13 DIAGNOSIS — D508 Other iron deficiency anemias: Secondary | ICD-10-CM

## 2020-08-13 DIAGNOSIS — D510 Vitamin B12 deficiency anemia due to intrinsic factor deficiency: Secondary | ICD-10-CM

## 2020-08-13 LAB — CBC WITH DIFFERENTIAL (CANCER CENTER ONLY)
Abs Immature Granulocytes: 0.05 10*3/uL (ref 0.00–0.07)
Basophils Absolute: 0 10*3/uL (ref 0.0–0.1)
Basophils Relative: 0 %
Eosinophils Absolute: 0.1 10*3/uL (ref 0.0–0.5)
Eosinophils Relative: 1 %
HCT: 44.2 % (ref 36.0–46.0)
Hemoglobin: 14.8 g/dL (ref 12.0–15.0)
Immature Granulocytes: 1 %
Lymphocytes Relative: 15 %
Lymphs Abs: 1.1 10*3/uL (ref 0.7–4.0)
MCH: 30.6 pg (ref 26.0–34.0)
MCHC: 33.5 g/dL (ref 30.0–36.0)
MCV: 91.5 fL (ref 80.0–100.0)
Monocytes Absolute: 0.7 10*3/uL (ref 0.1–1.0)
Monocytes Relative: 10 %
Neutro Abs: 5.2 10*3/uL (ref 1.7–7.7)
Neutrophils Relative %: 73 %
Platelet Count: 167 10*3/uL (ref 150–400)
RBC: 4.83 MIL/uL (ref 3.87–5.11)
RDW: 14.6 % (ref 11.5–15.5)
WBC Count: 7.2 10*3/uL (ref 4.0–10.5)
nRBC: 0 % (ref 0.0–0.2)

## 2020-08-13 LAB — CMP (CANCER CENTER ONLY)
ALT: 35 U/L (ref 0–44)
AST: 58 U/L — ABNORMAL HIGH (ref 15–41)
Albumin: 4 g/dL (ref 3.5–5.0)
Alkaline Phosphatase: 91 U/L (ref 38–126)
Anion gap: 7 (ref 5–15)
BUN: 10 mg/dL (ref 8–23)
CO2: 31 mmol/L (ref 22–32)
Calcium: 9.6 mg/dL (ref 8.9–10.3)
Chloride: 98 mmol/L (ref 98–111)
Creatinine: 0.6 mg/dL (ref 0.44–1.00)
GFR, Estimated: >60 mL/min (ref >60)
Glucose, Bld: 87 mg/dL (ref 70–99)
Potassium: 4.5 mmol/L (ref 3.5–5.1)
Sodium: 136 mmol/L (ref 135–145)
Total Bilirubin: 0.9 mg/dL (ref 0.3–1.2)
Total Protein: 7.3 g/dL (ref 6.5–8.1)

## 2020-08-13 MED ORDER — ZOLEDRONIC ACID 4 MG/100ML IV SOLN
4.0000 mg | Freq: Once | INTRAVENOUS | Status: AC
  Administered 2020-08-13: 4 mg via INTRAVENOUS
  Filled 2020-08-13: qty 100

## 2020-08-13 MED ORDER — SODIUM CHLORIDE 0.9 % IV SOLN
INTRAVENOUS | Status: DC
  Filled 2020-08-13: qty 250

## 2020-08-13 NOTE — Telephone Encounter (Signed)
Per 08/13/20 los gave patient upcoming appointment - patient confirmed

## 2020-08-13 NOTE — Patient Instructions (Signed)

## 2020-08-13 NOTE — Progress Notes (Signed)
Hematology and Oncology Follow Up Visit  [name] [id number] November 22, 1945 75 y.o. 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 Hct is stable at 44.2%.  Her lower back pain comes and goes. She is in a wheel chair today.  No swelling, numbness or tingling in her extremities at this time.  No falls or syncope to report.  No fever, chills, vomiting, cough, rash, dizziness, chest pain or changes in bowel or bladder habits.  She has occasional nausea and abdominal discomfort with episodes diarrhea. She has right upper quadrant pain and issues with gallbladder attacks. She states that so far her GI team has held off from surgery.   She has SOB with exacerbation of asthma. She will take a break to rest as needed for this and also when feeling fatigued.  She has occasional palpitations and has an appointment with cardiology later this week on Friday.  She has maintained a good appetite and is staying well hydrated. Her weight is stable at 133 lbs.   ECOG Performance Status: 1 - Symptomatic but completely ambulatory  Medications:  Allergies as of 08/13/2020       Reactions   Dulera [mometasone Furo-formoterol Fum] Cough        Medication List        Accurate as of August 13, 2020 12:06 PM. If you have any questions, ask your nurse or doctor.          albuterol 108 (90 Base) MCG/ACT inhaler Commonly known as: VENTOLIN HFA Inhale 2  puffs into the lungs every 4 (four) hours as needed for wheezing or shortness of breath.   budesonide-formoterol 80-4.5 MCG/ACT inhaler Commonly known as: SYMBICORT Inhale 2 puffs into the lungs 2 (two) times daily.   dicyclomine 20 MG tablet Commonly known as: BENTYL Take 1 tablet by mouth daily as needed for spasms.   doxycycline 100 MG EC tablet Commonly known as: DORYX Take 100 mg by mouth 2 (two) times daily.   folic acid 1 MG tablet Commonly known as: FOLVITE TAKE 1 TABLET(1 MG) BY MOUTH DAILY   LORazepam 0.5 MG tablet Commonly known as: ATIVAN TAKE 1 TABLET BY MOUTH EVERY NIGHT AT BEDTIME   pantoprazole 40 MG tablet Commonly known as: PROTONIX Take 40 mg by mouth 2 (two) times daily.   propranolol 20 MG tablet Commonly known as: INDERAL Take 20 mg by mouth daily.   sertraline 100 MG tablet Commonly known as: ZOLOFT Take 100 mg by mouth at bedtime.   Stiolto Respimat 2.5-2.5 MCG/ACT Aers Generic drug: Tiotropium Bromide-Olodaterol Inhale 2 puffs into the lungs daily.   Vitamin D (Ergocalciferol) 1.25 MG (50000 UNIT) Caps capsule Commonly known as: DRISDOL Take 1 capsule (50,000 Units total) by mouth every 7 (seven) days. Sundays   Xarelto 20 MG Tabs tablet Generic drug: rivaroxaban TAKE 1 TABLET(20 MG) BY MOUTH DAILY WITH SUPPER        Allergies:  Allergies  Allergen Reactions   Dulera [Mometasone Furo-Formoterol Fum] Cough    Past Medical History, Surgical history, Social history, and Family History were reviewed and updated.  Review of Systems: All other 10 point review of systems is negative.   Physical Exam:  weight is 133 lb 1.3 oz (60.4 kg). Her oral temperature is 98.1 F (36.7 C). Her blood pressure is 133/67 and her pulse is 76. Her respiration is 19 and oxygen saturation is 98%.   Wt Readings from Last 3 Encounters:  08/13/20 133 lb 1.3 oz (60.4 kg)  06/18/20 134 lb (60.8 kg)  04/29/20 136 lb 3.2 oz (61.8 kg)    Ocular: Sclerae  unicteric, pupils equal, round and reactive to light Ear-nose-throat: Oropharynx clear, dentition fair Lymphatic: No cervical or supraclavicular adenopathy Lungs no rales or rhonchi, good excursion bilaterally Heart regular rate and rhythm, no murmur appreciated Abd soft, nontender, positive bowel sounds MSK no focal spinal tenderness, no joint edema Neuro: non-focal, well-oriented, appropriate affect Breasts: Deferred   Lab Results  Component Value Date   WBC 7.2 08/13/2020   HGB 14.8 08/13/2020   HCT 44.2 08/13/2020   MCV 91.5 08/13/2020   PLT 167 08/13/2020   Lab Results  Component Value Date   FERRITIN 27 06/18/2020   IRON 63 06/18/2020   TIBC 390 06/18/2020   UIBC 327 06/18/2020   IRONPCTSAT 16 (L) 06/18/2020   Lab Results  Component Value Date   RETICCTPCT 1.5 06/12/2014   RBC 4.83 08/13/2020   RETICCTABS 65.9 06/12/2014   Lab Results  Component Value Date   KAPLAMBRATIO 0.91 04/27/2016   Lab Results  Component Value Date   IGGSERUM 897 04/27/2016   IGA 339 02/27/2014   IGMSERUM 132 04/27/2016   Lab Results  Component Value Date   MSPIKE Not Observed 04/27/2016     Chemistry      Component Value Date/Time   NA 135 06/18/2020 1137   NA 144 01/20/2017 1344   NA 138 01/17/2016 1356   K 4.0 06/18/2020 1137   K 3.6 01/20/2017 1344   K 4.1 01/17/2016 1356   CL 97 (L) 06/18/2020 1137   CL 107 01/20/2017 1344   CO2 29 06/18/2020 1137   CO2 25 01/20/2017 1344   CO2 25 01/17/2016 1356   BUN 12 06/18/2020 1137   BUN 12 01/20/2017 1344   BUN 12.3 01/17/2016 1356   CREATININE 0.68 06/18/2020 1137   CREATININE 0.7 01/20/2017 1344   CREATININE 0.8 01/17/2016 1356      Component Value Date/Time   CALCIUM 9.7 06/18/2020 1137   CALCIUM 8.9 01/20/2017 1344   CALCIUM 9.1 01/17/2016 1356   ALKPHOS 73 06/18/2020 1137   ALKPHOS 68 01/20/2017 1344   ALKPHOS 87 01/17/2016 1356   AST 45 (H) 06/18/2020 1137   AST 48 (H) 01/17/2016 1356   ALT 28 06/18/2020 1137    ALT 49 (H) 01/20/2017 1344   ALT 38 01/17/2016 1356   BILITOT 0.8 06/18/2020 1137   BILITOT 0.67 01/17/2016 1356       Impression and Plan: [name] is a very pleasant 75 yo caucasian female with polycythemia, 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 She was diagnosed with extensive left lower extremity DVT and small right PE in February 2021 (resolved on CT angio and [place] in June 2021). She had a mildly elevated homocystine level and positive lupus anticoagulant.  No phlebotomy needed, Hct 44.2%.  She will continue her same regimen with Xarelto.  She received her  Zometa today as planned.  Follow-up in 8 weeks.  She can contact our office with any questions or concerns.   Laverna Peace, NP 7/19/202212:06 PM

## 2020-08-14 LAB — IRON AND TIBC
Iron: 118 ug/dL (ref 41–142)
Saturation Ratios: 33 % (ref 21–57)
TIBC: 363 ug/dL (ref 236–444)
UIBC: 244 ug/dL (ref 120–384)

## 2020-08-14 LAB — FERRITIN: Ferritin: 35 ng/mL (ref 11–307)

## 2020-08-14 LAB — HOMOCYSTEINE: Homocysteine: 14.9 umol/L (ref 0.0–19.2)

## 2020-08-15 LAB — LUPUS ANTICOAGULANT PANEL
DRVVT: 67.4 s — ABNORMAL HIGH (ref 0.0–47.0)
PTT Lupus Anticoagulant: 37.1 s (ref 0.0–51.9)

## 2020-08-15 LAB — DRVVT MIX: dRVVT Mix: 48.7 s — ABNORMAL HIGH (ref 0.0–40.4)

## 2020-08-15 LAB — DRVVT CONFIRM: dRVVT Confirm: 1.5 ratio — ABNORMAL HIGH (ref 0.8–1.2)

## 2020-08-16 ENCOUNTER — Ambulatory Visit: Payer: Medicare Other | Admitting: Cardiovascular Disease

## 2020-08-20 ENCOUNTER — Telehealth: Payer: Self-pay

## 2020-09-12 ENCOUNTER — Ambulatory Visit: Payer: Medicare Other | Admitting: Gastroenterology

## 2020-10-04 DIAGNOSIS — Z23 Encounter for immunization: Secondary | ICD-10-CM | POA: Diagnosis not present

## 2020-10-18 ENCOUNTER — Ambulatory Visit: Payer: Medicare Other | Admitting: Family

## 2020-10-18 ENCOUNTER — Other Ambulatory Visit: Payer: Medicare Other

## 2020-10-21 ENCOUNTER — Other Ambulatory Visit: Payer: Self-pay

## 2020-10-21 ENCOUNTER — Inpatient Hospital Stay: Payer: Medicare Other

## 2020-10-21 ENCOUNTER — Inpatient Hospital Stay: Payer: Medicare Other | Attending: Hematology & Oncology

## 2020-10-21 ENCOUNTER — Encounter: Payer: Self-pay | Admitting: Family

## 2020-10-21 ENCOUNTER — Inpatient Hospital Stay (HOSPITAL_BASED_OUTPATIENT_CLINIC_OR_DEPARTMENT_OTHER): Payer: Medicare Other | Admitting: Family

## 2020-10-21 ENCOUNTER — Telehealth: Payer: Self-pay | Admitting: Family

## 2020-10-21 VITALS — BP 129/71 | HR 76 | Temp 98.1°F | Resp 18 | Wt 135.8 lb

## 2020-10-21 DIAGNOSIS — I2699 Other pulmonary embolism without acute cor pulmonale: Secondary | ICD-10-CM

## 2020-10-21 DIAGNOSIS — Z79899 Other long term (current) drug therapy: Secondary | ICD-10-CM | POA: Insufficient documentation

## 2020-10-21 DIAGNOSIS — I82422 Acute embolism and thrombosis of left iliac vein: Secondary | ICD-10-CM

## 2020-10-21 DIAGNOSIS — M542 Cervicalgia: Secondary | ICD-10-CM | POA: Insufficient documentation

## 2020-10-21 DIAGNOSIS — R5383 Other fatigue: Secondary | ICD-10-CM | POA: Insufficient documentation

## 2020-10-21 DIAGNOSIS — M545 Low back pain, unspecified: Secondary | ICD-10-CM | POA: Insufficient documentation

## 2020-10-21 DIAGNOSIS — K589 Irritable bowel syndrome without diarrhea: Secondary | ICD-10-CM | POA: Insufficient documentation

## 2020-10-21 DIAGNOSIS — D45 Polycythemia vera: Secondary | ICD-10-CM | POA: Insufficient documentation

## 2020-10-21 DIAGNOSIS — E7211 Homocystinuria: Secondary | ICD-10-CM | POA: Diagnosis not present

## 2020-10-21 DIAGNOSIS — R0602 Shortness of breath: Secondary | ICD-10-CM | POA: Insufficient documentation

## 2020-10-21 DIAGNOSIS — D751 Secondary polycythemia: Secondary | ICD-10-CM | POA: Diagnosis not present

## 2020-10-21 DIAGNOSIS — G713 Mitochondrial myopathy, not elsewhere classified: Secondary | ICD-10-CM | POA: Diagnosis not present

## 2020-10-21 DIAGNOSIS — Z86718 Personal history of other venous thrombosis and embolism: Secondary | ICD-10-CM | POA: Insufficient documentation

## 2020-10-21 DIAGNOSIS — Z7901 Long term (current) use of anticoagulants: Secondary | ICD-10-CM | POA: Diagnosis not present

## 2020-10-21 DIAGNOSIS — M810 Age-related osteoporosis without current pathological fracture: Secondary | ICD-10-CM | POA: Insufficient documentation

## 2020-10-21 DIAGNOSIS — R760 Raised antibody titer: Secondary | ICD-10-CM

## 2020-10-21 DIAGNOSIS — D6862 Lupus anticoagulant syndrome: Secondary | ICD-10-CM | POA: Insufficient documentation

## 2020-10-21 LAB — CBC WITH DIFFERENTIAL (CANCER CENTER ONLY)
Abs Immature Granulocytes: 0.05 10*3/uL (ref 0.00–0.07)
Basophils Absolute: 0 10*3/uL (ref 0.0–0.1)
Basophils Relative: 1 %
Eosinophils Absolute: 0.2 10*3/uL (ref 0.0–0.5)
Eosinophils Relative: 2 %
HCT: 43.4 % (ref 36.0–46.0)
Hemoglobin: 14.5 g/dL (ref 12.0–15.0)
Immature Granulocytes: 1 %
Lymphocytes Relative: 15 %
Lymphs Abs: 1.2 10*3/uL (ref 0.7–4.0)
MCH: 31.2 pg (ref 26.0–34.0)
MCHC: 33.4 g/dL (ref 30.0–36.0)
MCV: 93.3 fL (ref 80.0–100.0)
Monocytes Absolute: 0.7 10*3/uL (ref 0.1–1.0)
Monocytes Relative: 9 %
Neutro Abs: 5.7 10*3/uL (ref 1.7–7.7)
Neutrophils Relative %: 72 %
Platelet Count: 161 10*3/uL (ref 150–400)
RBC: 4.65 MIL/uL (ref 3.87–5.11)
RDW: 13.6 % (ref 11.5–15.5)
WBC Count: 7.8 10*3/uL (ref 4.0–10.5)
nRBC: 0 % (ref 0.0–0.2)

## 2020-10-21 LAB — CMP (CANCER CENTER ONLY)
ALT: 29 U/L (ref 0–44)
AST: 45 U/L — ABNORMAL HIGH (ref 15–41)
Albumin: 3.8 g/dL (ref 3.5–5.0)
Alkaline Phosphatase: 84 U/L (ref 38–126)
Anion gap: 9 (ref 5–15)
BUN: 8 mg/dL (ref 8–23)
CO2: 28 mmol/L (ref 22–32)
Calcium: 9.2 mg/dL (ref 8.9–10.3)
Chloride: 99 mmol/L (ref 98–111)
Creatinine: 0.64 mg/dL (ref 0.44–1.00)
GFR, Estimated: >60 mL/min (ref >60)
Glucose, Bld: 99 mg/dL (ref 70–99)
Potassium: 4 mmol/L (ref 3.5–5.1)
Sodium: 136 mmol/L (ref 135–145)
Total Bilirubin: 0.8 mg/dL (ref 0.3–1.2)
Total Protein: 7.2 g/dL (ref 6.5–8.1)

## 2020-10-21 NOTE — Progress Notes (Signed)
Hematology and Oncology Follow Up Visit  [name] [id number] 21-Feb-1945 75 y.o. 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 She is doing fairly well but notes fatigue and chronic lower back and neck pain.  Hct is 43%. No blood loss noted. No bruising or petechiae.  She continues to tolerate Eliquis nicely.  She has had no fever, chills, n/v, cough, rash, dizziness, palpitations or changes in bladder habits.  She has IBS-D and takes an antidiarrheal as needed which helps.  She has occasional episodes of chest discomfort and SOB with over exertion (asthma).  No swelling, numbness or tingling in her extremities.  No falls or syncope reported.  She has been eating well and staying hydrated throughout the day. Her weight today is stable at 135 lbs.   ECOG Performance Status: 1 - Symptomatic but completely ambulatory  Medications:  Allergies as of 10/21/2020       Reactions   Dulera [mometasone Furo-formoterol Fum] Cough        Medication List        Accurate as of October 21, 2020  1:50 PM. If you have any questions, ask your nurse or doctor.          albuterol 108 (90 Base) MCG/ACT inhaler Commonly known as: VENTOLIN HFA Inhale 2 puffs into the lungs every 4 (four) hours as needed for wheezing or shortness of breath.   budesonide-formoterol 80-4.5 MCG/ACT inhaler Commonly known as: SYMBICORT Inhale 2 puffs into the lungs 2 (two)  times daily.   dicyclomine 20 MG tablet Commonly known as: BENTYL Take 1 tablet by mouth daily as needed for spasms.   doxycycline 100 MG EC tablet Commonly known as: DORYX Take 100 mg by mouth 2 (two) times daily.   folic acid 1 MG tablet Commonly known as: FOLVITE TAKE 1 TABLET(1 MG) BY MOUTH DAILY   LORazepam 0.5 MG tablet Commonly known as: ATIVAN TAKE 1 TABLET BY MOUTH EVERY NIGHT AT BEDTIME   pantoprazole 40 MG tablet Commonly known as: PROTONIX Take 40 mg by mouth 2 (two) times daily.   propranolol 20 MG tablet Commonly known as: INDERAL Take 20 mg by mouth daily.   sertraline 100 MG tablet Commonly known as: ZOLOFT Take 100 mg by mouth at bedtime.   Stiolto Respimat 2.5-2.5 MCG/ACT Aers Generic drug: Tiotropium Bromide-Olodaterol Inhale 2 puffs into the lungs daily.   Vitamin D (Ergocalciferol) 1.25 MG (50000 UNIT) Caps capsule Commonly known as: DRISDOL Take 1 capsule (50,000 Units total) by mouth every 7 (seven) days. Sundays   Xarelto 20 MG Tabs tablet Generic drug: rivaroxaban TAKE 1 TABLET(20 MG) BY MOUTH DAILY WITH SUPPER        Allergies:  Allergies  Allergen Reactions   Dulera [Mometasone Furo-Formoterol Fum] Cough    Past Medical History, Surgical history, Social history, and Family History were reviewed and updated.  Review of Systems: All other 10 point review of systems is negative.  Physical Exam:  weight is 135 lb 12.8 oz (61.6 kg). Her oral temperature is 98.1 F (36.7 C). Her blood pressure is 129/71 and her pulse is 76. Her respiration is 18 and oxygen saturation is 93%.   Wt Readings from Last 3 Encounters:  10/21/20 135 lb 12.8 oz (61.6 kg)  08/13/20 133 lb 1.3 oz (60.4 kg)  06/18/20 134 lb (60.8 kg)    Ocular: Sclerae unicteric, pupils equal, round and reactive to light Ear-nose-throat: Oropharynx clear, dentition fair Lymphatic: No cervical or supraclavicular adenopathy Lungs no rales or rhonchi, good excursion  bilaterally Heart regular rate and rhythm, no murmur appreciated Abd soft, nontender, positive bowel sounds MSK no focal spinal tenderness, no joint edema Neuro: non-focal, well-oriented, appropriate affect Breasts: Deferred   Lab Results  Component Value Date   WBC 7.8 10/21/2020   HGB 14.5 10/21/2020   HCT 43.4 10/21/2020   MCV 93.3 10/21/2020   PLT 161 10/21/2020   Lab Results  Component Value Date   FERRITIN 35 08/13/2020   IRON 118 08/13/2020   TIBC 363 08/13/2020   UIBC 244 08/13/2020   IRONPCTSAT 33 08/13/2020   Lab Results  Component Value Date   RETICCTPCT 1.5 06/12/2014   RBC 4.65 10/21/2020   RETICCTABS 65.9 06/12/2014   Lab Results  Component Value Date   KAPLAMBRATIO 0.91 04/27/2016   Lab Results  Component Value Date   IGGSERUM 897 04/27/2016   IGA 339 02/27/2014   IGMSERUM 132 04/27/2016   Lab Results  Component Value Date   MSPIKE Not Observed 04/27/2016     Chemistry      Component Value Date/Time   NA 136 10/21/2020 1259   NA 144 01/20/2017 1344   NA 138 01/17/2016 1356   K 4.0 10/21/2020 1259   K 3.6 01/20/2017 1344   K 4.1 01/17/2016 1356   CL 99 10/21/2020 1259   CL 107 01/20/2017 1344   CO2 28 10/21/2020 1259   CO2 25 01/20/2017 1344   CO2 25 01/17/2016 1356   BUN 8 10/21/2020 1259   BUN 12 01/20/2017 1344   BUN 12.3 01/17/2016 1356   CREATININE 0.64 10/21/2020 1259   CREATININE 0.7 01/20/2017 1344   CREATININE 0.8 01/17/2016 1356      Component Value Date/Time   CALCIUM 9.2 10/21/2020 1259   CALCIUM 8.9 01/20/2017 1344   CALCIUM 9.1 01/17/2016 1356   ALKPHOS 84 10/21/2020 1259   ALKPHOS 68 01/20/2017 1344   ALKPHOS 87 01/17/2016 1356   AST 45 (H) 10/21/2020 1259   AST 48 (H) 01/17/2016 1356   ALT 29 10/21/2020 1259   ALT 49 (H) 01/20/2017 1344   ALT 38 01/17/2016 1356   BILITOT 0.8 10/21/2020 1259   BILITOT 0.67 01/17/2016 1356       Impression and Plan: [name] is a very pleasant 75 yo caucasian female with  polycythemia, 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 She was diagnosed with extensive left lower extremity DVT and small right PE in February 2021 (resolved on CT angio and [place] in June 2021). She has had a mildly elevated homocystine level and positive lupus anticoagulant.  So far there has been no evidence of recurrence.  Iron studies pending.  Hct 43%, no phlebotomy needed this visit.  Follow-up in 8 weeks.  She can contact our office with any questions or concerns.   Lottie Dawson, NP 9/26/20221:50 PM

## 2020-10-21 NOTE — Telephone Encounter (Signed)
Scheduled appt per 9/26 los - mailed letter with appt date and time   

## 2020-10-22 LAB — DRVVT MIX: dRVVT Mix: 47.8 s — ABNORMAL HIGH (ref 0.0–40.4)

## 2020-10-22 LAB — IRON AND TIBC
Iron: 62 ug/dL (ref 41–142)
Saturation Ratios: 17 % — ABNORMAL LOW (ref 21–57)
TIBC: 364 ug/dL (ref 236–444)
UIBC: 302 ug/dL (ref 120–384)

## 2020-10-22 LAB — LUPUS ANTICOAGULANT PANEL
DRVVT: 67.2 s — ABNORMAL HIGH (ref 0.0–47.0)
PTT Lupus Anticoagulant: 38.2 s (ref 0.0–51.9)

## 2020-10-22 LAB — HOMOCYSTEINE: Homocysteine: 10.6 umol/L (ref 0.0–19.2)

## 2020-10-22 LAB — DRVVT CONFIRM: dRVVT Confirm: 1.4 ratio — ABNORMAL HIGH (ref 0.8–1.2)

## 2020-10-22 LAB — FERRITIN: Ferritin: 31 ng/mL (ref 11–307)

## 2020-10-24 ENCOUNTER — Encounter: Payer: Self-pay | Admitting: Gastroenterology

## 2020-10-31 ENCOUNTER — Other Ambulatory Visit: Payer: Self-pay

## 2020-10-31 ENCOUNTER — Encounter: Payer: Self-pay | Admitting: Emergency Medicine

## 2020-10-31 ENCOUNTER — Ambulatory Visit (INDEPENDENT_AMBULATORY_CARE_PROVIDER_SITE_OTHER): Payer: Medicare Other | Admitting: Emergency Medicine

## 2020-10-31 DIAGNOSIS — J449 Chronic obstructive pulmonary disease, unspecified: Secondary | ICD-10-CM

## 2020-10-31 DIAGNOSIS — K219 Gastro-esophageal reflux disease without esophagitis: Secondary | ICD-10-CM | POA: Diagnosis not present

## 2020-10-31 NOTE — Assessment & Plan Note (Signed)
Try taking her pantoprazole to 1 time daily.  Take it 1 hour around food.  If after 2 weeks you do not have any increased reflux, coughing, shortness of breath then you can try stopping the pantoprazole altogether.  If your reflux becomes active and causes breathing symptoms then start it back.

## 2020-10-31 NOTE — Progress Notes (Signed)
   Subjective:    Patient ID: [name], female    DOB: 07-17-1945, 75 y.o.   MRN: [id number]  HPI  ROV 10/31/20 --75 year old woman with severe COPD and a positive bronchodilator response, history of DVT and bilateral PE, polycythemia vera with hemochromatosis.  She also has chronic cough with contribution from GERD and rhinitis.  It improved when she increased her PPI.  At our last visit I changed her Symbicort to Stiolto to see if she would get more benefit.  She remains on anticoagulation. The addition of LAMA did seem to help her breathing. She is not having increased mucous. She is on pantoprazole bid. She has a couple coughing spells at night, better w chewing gum. Not having a lot of nasal congestion.    Review of Systems  Constitutional:  Negative for fever and unexpected weight change.  HENT:  Positive for congestion, postnasal drip and sinus pressure. Negative for dental problem, ear pain, nosebleeds, rhinorrhea, sneezing, sore throat and trouble swallowing.   Eyes:  Negative for redness and itching.  Respiratory:  Positive for cough and shortness of breath. Negative for chest tightness and wheezing.   Cardiovascular:  Negative for palpitations and leg swelling.  Gastrointestinal:  Negative for nausea and vomiting.  Genitourinary:  Negative for dysuria.  Musculoskeletal:  Negative for joint swelling.  Skin:  Negative for rash.  Neurological:  Negative for headaches.  Hematological:  Does not bruise/bleed easily.  Psychiatric/Behavioral:  Negative for dysphoric mood. The patient is not nervous/anxious.       Objective:   Physical Exam Vitals:   10/31/20 1338  BP: 112/70  Pulse: 68  SpO2: 94%  Weight: 135 lb (61.2 kg)  Height: 5\' 3"  (1.6 m)   Gen: Pleasant, well-nourished, in no distress,  normal affect, in a wheelchair  ENT: No lesions,  mouth clear,  oropharynx clear, no postnasal drip  Neck: No JVD, no stridor  Lungs: No use of accessory muscles, clear  without rales or rhonchi, no wheeze  Cardiovascular: RRR, heart sounds normal, no murmur or gallops, no peripheral edema  Musculoskeletal: No deformities, no cyanosis or clubbing  Neuro: alert, non focal  Skin: Warm, no lesions or rashes       Assessment & Plan:  COPD (chronic obstructive pulmonary disease) (HCC) Please continue your Stiolto 2 puffs once a day Keep albuterol available to use 2 puffs up to every 4 hours if needed for shortness of breath, chest tightness, wheezing.  Follow with Dr Lamonte Sakai in 6 months or sooner if you have any problems  Laryngopharyngeal reflux (LPR) Try taking her pantoprazole to 1 time daily.  Take it 1 hour around food.  If after 2 weeks you do not have any increased reflux, coughing, shortness of breath then you can try stopping the pantoprazole altogether.  If your reflux becomes active and causes breathing symptoms then start it back.  Baltazar Apo, MD, PhD 10/31/2020, 2:00 PM [facility] [phone] or if no answer [phone]

## 2020-10-31 NOTE — Assessment & Plan Note (Signed)
Please continue your Stiolto 2 puffs once a day Keep albuterol available to use 2 puffs up to every 4 hours if needed for shortness of breath, chest tightness, wheezing.  Follow with Dr Lamonte Sakai in 6 months or sooner if you have any problems

## 2020-10-31 NOTE — Patient Instructions (Addendum)
Please continue your Stiolto 2 puffs once a day Keep albuterol available to use 2 puffs up to every 4 hours if needed for shortness of breath, chest tightness, wheezing.  Try taking her pantoprazole to 1 time daily.  Take it 1 hour around food.  If after 2 weeks you do not have any increased reflux, coughing, shortness of breath then you can try stopping the pantoprazole altogether.  If your reflux becomes active and causes breathing symptoms then start it back. Follow with Dr Lamonte Sakai in 6 months or sooner if you have any problems

## 2020-11-05 ENCOUNTER — Other Ambulatory Visit: Payer: Self-pay

## 2020-11-05 NOTE — Telephone Encounter (Signed)
This encounter was created in error - please disregard.

## 2020-11-11 ENCOUNTER — Other Ambulatory Visit: Payer: Self-pay | Admitting: Emergency Medicine

## 2020-11-12 ENCOUNTER — Ambulatory Visit: Payer: Medicare Other | Admitting: Gastroenterology

## 2020-11-13 ENCOUNTER — Other Ambulatory Visit: Payer: Self-pay | Admitting: *Deleted

## 2020-11-13 NOTE — Patient Outreach (Signed)
[facility]) Care Management  11/13/2020  [name] [birth year] [id number]  Referral Received 10/11 Initial Outreach 10/19  Telephone Screen-Unsuccessful  RN attempted outreach call today however unsuccessful. RN able to leave a HIPAA approved voice message requesting a call back.  Will attempt another outreach call over the next week. Will also send outreach letter via [facility] services.  Raina Mina, RN Care Management Coordinator [place] Office [phone]

## 2020-11-19 ENCOUNTER — Telehealth: Payer: Self-pay | Admitting: Emergency Medicine

## 2020-11-19 ENCOUNTER — Other Ambulatory Visit: Payer: Self-pay | Admitting: Hematology & Oncology

## 2020-11-19 DIAGNOSIS — I82412 Acute embolism and thrombosis of left femoral vein: Secondary | ICD-10-CM

## 2020-11-19 DIAGNOSIS — I2699 Other pulmonary embolism without acute cor pulmonale: Secondary | ICD-10-CM

## 2020-11-19 MED ORDER — ALBUTEROL SULFATE HFA 108 (90 BASE) MCG/ACT IN AERS: 2.0000 | INHALATION_SPRAY | RESPIRATORY_TRACT | 2 refills | Status: DC | PRN

## 2020-11-19 NOTE — Telephone Encounter (Signed)
Called and spoke with patient who states that she needs RX sent in for her rescue inhaler. She also stated that Stiolto is expensive at $145 a month. Advised patient that I would send her patient assistance paperwork to fill out and for her to bring it by the office once done and we will see if she qualifies. Patient expressed understanding. Nothing further needed at this time.

## 2020-11-20 ENCOUNTER — Other Ambulatory Visit: Payer: Self-pay | Admitting: *Deleted

## 2020-11-20 ENCOUNTER — Telehealth: Payer: Self-pay | Admitting: Emergency Medicine

## 2020-11-20 NOTE — Patient Outreach (Signed)
[facility] [facility]) Care Management  11/20/2020  [name] 1945-09-23 [id number]   Telephone Assessment-Unsuccessful Outreach #2  RN attempted outreach call today however unsuccessful. RN able to leave a HIPAA approved voice message requesting a call back.  Will rescheduled another outreach call over the next week.  Raina Mina, RN Care Management Coordinator [place] Office [phone]

## 2020-11-20 NOTE — Telephone Encounter (Signed)
Spoke with the pt  She questions the albuterol inhaler we prescribed She states that she was told at last visit that she needed another emergency inhaler that is not albuterol  The last note and previous notes I look at only mention albuterol  She wanted [place] to double check with Dr Lamonte Sakai to make sure  Please advise

## 2020-11-21 NOTE — Telephone Encounter (Signed)
I do not recall talking to her about changing to a different SABA.  The only possible substitution we may have discussed would have been levalbuterol (Xopenex).  Ask her if that was what we were considering.  If so then we can send a prescription for Xopenex HFA, 2 puffs every 4 hours as needed for shortness of breath.  I do not know if this will be covered by her insurance.

## 2020-11-21 NOTE — Telephone Encounter (Signed)
Patient checking on message for Albuterol inhaler. Patient phone number is [phone].

## 2020-11-22 NOTE — Telephone Encounter (Signed)
Attempted to call pt but unable to reach. Left message for her to return call. 

## 2020-11-22 NOTE — Telephone Encounter (Signed)
Spoke to patient and relayed message.  Patient would like to hold off xopenex for now, as she just picked up ventolin Rx.  She does not recall having any issues with ventolin previously.  Nothing further needed at this time.

## 2020-11-26 ENCOUNTER — Other Ambulatory Visit: Payer: Self-pay | Admitting: *Deleted

## 2020-11-26 NOTE — Patient Outreach (Signed)
[facility]) Care Management  11/26/2020  [name] 1945-11-09 [id number]  Telephone Screen Outreach #3  RN attempted outreach call however unsuccessful. RN able to leave a HIPAA approved voice message requesting a call back.  Will follow up once again over the next 30 days per policy. Will outreach to the provider on other contacts and possible pending appointments for [facility] information to be provided.  Raina Mina, RN Care Management Coordinator [place] Office [phone]

## 2020-12-09 ENCOUNTER — Other Ambulatory Visit: Payer: Self-pay | Admitting: Emergency Medicine

## 2020-12-19 ENCOUNTER — Other Ambulatory Visit: Payer: Self-pay | Admitting: Hematology & Oncology

## 2020-12-19 DIAGNOSIS — E7211 Homocystinuria: Secondary | ICD-10-CM

## 2020-12-19 DIAGNOSIS — R7983 Abnormal findings of blood amino-acid level: Secondary | ICD-10-CM

## 2020-12-24 ENCOUNTER — Other Ambulatory Visit: Payer: Self-pay

## 2020-12-24 ENCOUNTER — Inpatient Hospital Stay: Payer: Medicare Other | Attending: Hematology & Oncology

## 2020-12-24 ENCOUNTER — Inpatient Hospital Stay: Payer: Medicare Other

## 2020-12-24 ENCOUNTER — Inpatient Hospital Stay (HOSPITAL_BASED_OUTPATIENT_CLINIC_OR_DEPARTMENT_OTHER): Payer: Medicare Other | Admitting: Family

## 2020-12-24 ENCOUNTER — Encounter: Payer: Self-pay | Admitting: Family

## 2020-12-24 DIAGNOSIS — M810 Age-related osteoporosis without current pathological fracture: Secondary | ICD-10-CM | POA: Insufficient documentation

## 2020-12-24 DIAGNOSIS — I2699 Other pulmonary embolism without acute cor pulmonale: Secondary | ICD-10-CM

## 2020-12-24 DIAGNOSIS — Z86718 Personal history of other venous thrombosis and embolism: Secondary | ICD-10-CM | POA: Diagnosis not present

## 2020-12-24 DIAGNOSIS — E7211 Homocystinuria: Secondary | ICD-10-CM

## 2020-12-24 DIAGNOSIS — D45 Polycythemia vera: Secondary | ICD-10-CM | POA: Diagnosis not present

## 2020-12-24 DIAGNOSIS — D751 Secondary polycythemia: Secondary | ICD-10-CM

## 2020-12-24 DIAGNOSIS — Z7901 Long term (current) use of anticoagulants: Secondary | ICD-10-CM | POA: Diagnosis not present

## 2020-12-24 DIAGNOSIS — R760 Raised antibody titer: Secondary | ICD-10-CM

## 2020-12-24 DIAGNOSIS — D508 Other iron deficiency anemias: Secondary | ICD-10-CM

## 2020-12-24 DIAGNOSIS — I82422 Acute embolism and thrombosis of left iliac vein: Secondary | ICD-10-CM | POA: Diagnosis not present

## 2020-12-24 LAB — CMP (CANCER CENTER ONLY)
ALT: 27 U/L (ref 0–44)
AST: 38 U/L (ref 15–41)
Albumin: 3.8 g/dL (ref 3.5–5.0)
Alkaline Phosphatase: 93 U/L (ref 38–126)
Anion gap: 11 (ref 5–15)
BUN: 10 mg/dL (ref 8–23)
CO2: 26 mmol/L (ref 22–32)
Calcium: 9.6 mg/dL (ref 8.9–10.3)
Chloride: 98 mmol/L (ref 98–111)
Creatinine: 0.61 mg/dL (ref 0.44–1.00)
GFR, Estimated: >60 mL/min (ref >60)
Glucose, Bld: 94 mg/dL (ref 70–99)
Potassium: 3.8 mmol/L (ref 3.5–5.1)
Sodium: 135 mmol/L (ref 135–145)
Total Bilirubin: 0.9 mg/dL (ref 0.3–1.2)
Total Protein: 7.1 g/dL (ref 6.5–8.1)

## 2020-12-24 LAB — CBC WITH DIFFERENTIAL (CANCER CENTER ONLY)
Abs Immature Granulocytes: 0.04 10*3/uL (ref 0.00–0.07)
Basophils Absolute: 0 10*3/uL (ref 0.0–0.1)
Basophils Relative: 1 %
Eosinophils Absolute: 0.1 10*3/uL (ref 0.0–0.5)
Eosinophils Relative: 2 %
HCT: 45.1 % (ref 36.0–46.0)
Hemoglobin: 15.2 g/dL — ABNORMAL HIGH (ref 12.0–15.0)
Immature Granulocytes: 1 %
Lymphocytes Relative: 19 %
Lymphs Abs: 1.3 10*3/uL (ref 0.7–4.0)
MCH: 32.1 pg (ref 26.0–34.0)
MCHC: 33.7 g/dL (ref 30.0–36.0)
MCV: 95.3 fL (ref 80.0–100.0)
Monocytes Absolute: 0.7 10*3/uL (ref 0.1–1.0)
Monocytes Relative: 10 %
Neutro Abs: 4.9 10*3/uL (ref 1.7–7.7)
Neutrophils Relative %: 67 %
Platelet Count: 136 10*3/uL — ABNORMAL LOW (ref 150–400)
RBC: 4.73 MIL/uL (ref 3.87–5.11)
RDW: 13.5 % (ref 11.5–15.5)
WBC Count: 7.2 10*3/uL (ref 4.0–10.5)
nRBC: 0 % (ref 0.0–0.2)

## 2020-12-24 LAB — FERRITIN: Ferritin: 23 ng/mL (ref 11–307)

## 2020-12-24 LAB — IRON AND TIBC
Iron: 93 ug/dL (ref 28–170)
Saturation Ratios: 24 % (ref 10.4–31.8)
TIBC: 391 ug/dL (ref 250–450)
UIBC: 298 ug/dL

## 2020-12-24 MED ORDER — SODIUM CHLORIDE 0.9 % IV SOLN: INTRAVENOUS | Status: DC

## 2020-12-24 NOTE — Progress Notes (Signed)
[name] presents today for phlebotomy per MD orders.  20 ga angio cath started to left ac.  Pt requests 250 ml of NS prior to start of phlebotomy. 250 ml of NS given prior to phlebotomy. Phlebotomy procedure started at 1445 and ended at 29. Additional 250 ml of NS given after phlebotomy. 500 grams removed without difficulty.  Patient observed for 30 minutes after procedure without any incident. Patient tolerated procedure well. IV needle removed intact.

## 2020-12-24 NOTE — Progress Notes (Signed)
Hematology and Oncology Follow Up Visit  [name] [id number] 02-Apr-1945 75 y.o. 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 is here today for follow-up. She is feeling fatigue and has notes loss in taste and itching "under the skin". No visible rash noted.  She plans to follow-up with her PCP for further evaluation.  Hct today is 45.1%.  No blood loss noted. No abnormal bruising, no petechiae.  She has episodes of nausea and appetite comes and goes. She is doing her best to stay well hydrated. Her weight is stable at 137 lbs.  She has not noted any blood loss. No abnormal bruising, no petechiae.  She has SOB with exertion and will rest when needed.  No fever, chills, rash, dizziness, chest pain, palpitations or changes in bowel or bladder habits.  No changes to lower back discomfort.  She has tingling in the hands and feet that comes and goes.  No falls or syncope to report.   ECOG Performance Status: 1 - Symptomatic but completely ambulatory  Medications:  Allergies as of 12/24/2020       Reactions   Dulera [mometasone Furo-formoterol Fum] Cough        Medication List        Accurate as of December 24, 2020  1:46 PM. If you have any questions, ask your nurse or doctor.          albuterol 108 (90 Base) MCG/ACT inhaler Commonly known as: VENTOLIN HFA Inhale 2 puffs into the lungs every 4 (four) hours as needed for wheezing or shortness of  breath.   dicyclomine 20 MG tablet Commonly known as: BENTYL Take 1 tablet by mouth daily as needed for spasms.   doxycycline 100 MG EC tablet Commonly known as: DORYX Take 100 mg by mouth 2 (two) times daily.   folic acid 1 MG tablet Commonly known as: FOLVITE TAKE 1 TABLET(1 MG) BY MOUTH DAILY   LORazepam 0.5 MG tablet Commonly known as: ATIVAN TAKE 1 TABLET BY MOUTH EVERY NIGHT AT BEDTIME   pantoprazole 40 MG tablet Commonly known as: PROTONIX Take 40 mg by mouth daily.   propranolol 20 MG tablet Commonly known as: INDERAL Take 20 mg by mouth daily.   sertraline 100 MG tablet Commonly known as: ZOLOFT Take 100 mg by mouth at bedtime.   Stiolto Respimat 2.5-2.5 MCG/ACT Aers Generic drug: Tiotropium Bromide-Olodaterol INHALE 2 PUFFS INTO THE LUNGS DAILY   Vitamin D (Ergocalciferol) 1.25 MG (50000 UNIT) Caps capsule Commonly known as: DRISDOL Take 1 capsule (50,000 Units total) by mouth every 7 (seven) days. Sundays   Xarelto 20 MG Tabs tablet Generic drug: rivaroxaban TAKE 1 TABLET(20 MG) BY MOUTH DAILY WITH SUPPER        Allergies:  Allergies  Allergen Reactions   Dulera [Mometasone Furo-Formoterol Fum] Cough    Past Medical History, Surgical history, Social history, and Family History were reviewed and  updated.  Review of Systems: All other 10 point review of systems is negative.   Physical Exam:  weight is 137 lb 6.4 oz (62.3 kg). Her oral temperature is 97.8 F (36.6 C). Her blood pressure is 98/66 and her pulse is 77. Her respiration is 18 and oxygen saturation is 99%.   Wt Readings from Last 3 Encounters:  12/24/20 137 lb 6.4 oz (62.3 kg)  10/31/20 135 lb (61.2 kg)  10/21/20 135 lb 12.8 oz (61.6 kg)    Ocular: Sclerae unicteric, pupils equal, round and reactive to light Ear-nose-throat: Oropharynx clear, dentition fair Lymphatic: No cervical or supraclavicular adenopathy Lungs no rales or rhonchi, good excursion bilaterally Heart regular  rate and rhythm, no murmur appreciated Abd soft, nontender, positive bowel sounds MSK no focal spinal tenderness, no joint edema Neuro: non-focal, well-oriented, appropriate affect Breasts: Deferred   Lab Results  Component Value Date   WBC 7.2 12/24/2020   HGB 15.2 (H) 12/24/2020   HCT 45.1 12/24/2020   MCV 95.3 12/24/2020   PLT 136 (L) 12/24/2020   Lab Results  Component Value Date   FERRITIN 31 10/21/2020   IRON 62 10/21/2020   TIBC 364 10/21/2020   UIBC 302 10/21/2020   IRONPCTSAT 17 (L) 10/21/2020   Lab Results  Component Value Date   RETICCTPCT 1.5 06/12/2014   RBC 4.73 12/24/2020   RETICCTABS 65.9 06/12/2014   Lab Results  Component Value Date   KAPLAMBRATIO 0.91 04/27/2016   Lab Results  Component Value Date   IGGSERUM 897 04/27/2016   IGA 339 02/27/2014   IGMSERUM 132 04/27/2016   Lab Results  Component Value Date   MSPIKE Not Observed 04/27/2016     Chemistry      Component Value Date/Time   NA 136 10/21/2020 1259   NA 144 01/20/2017 1344   NA 138 01/17/2016 1356   K 4.0 10/21/2020 1259   K 3.6 01/20/2017 1344   K 4.1 01/17/2016 1356   CL 99 10/21/2020 1259   CL 107 01/20/2017 1344   CO2 28 10/21/2020 1259   CO2 25 01/20/2017 1344   CO2 25 01/17/2016 1356   BUN 8 10/21/2020 1259   BUN 12 01/20/2017 1344   BUN 12.3 01/17/2016 1356   CREATININE 0.64 10/21/2020 1259   CREATININE 0.7 01/20/2017 1344   CREATININE 0.8 01/17/2016 1356      Component Value Date/Time   CALCIUM 9.2 10/21/2020 1259   CALCIUM 8.9 01/20/2017 1344   CALCIUM 9.1 01/17/2016 1356   ALKPHOS 84 10/21/2020 1259   ALKPHOS 68 01/20/2017 1344   ALKPHOS 87 01/17/2016 1356   AST 45 (H) 10/21/2020 1259   AST 48 (H) 01/17/2016 1356   ALT 29 10/21/2020 1259   ALT 49 (H) 01/20/2017 1344   ALT 38 01/17/2016 1356   BILITOT 0.8 10/21/2020 1259   BILITOT 0.67 01/17/2016 1356       Impression and Plan: [name] is a very pleasant 75 yo caucasian female with polycythemia,  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 She was diagnosed with extensive left lower extremity DVT and small right PE in February 2021 (resolved on CT angio and [place] in June 2021). She has had a mildly elevated homocystine level and positive lupus anticoagulant. She is on lifelong anticoagulant therapy.  We will proceed with fluids and phlebotomy today for Hct 45.1%.  Iron studies pending.  Follow-up 8 weeks.  She can contact our office with any questions or concerns.    Lottie Dawson,  NP 11/29/20221:46 PM

## 2020-12-25 LAB — LUPUS ANTICOAGULANT PANEL
DRVVT: 64.6 s — ABNORMAL HIGH (ref 0.0–47.0)
PTT Lupus Anticoagulant: 38.3 s (ref 0.0–51.9)

## 2020-12-25 LAB — DRVVT MIX: dRVVT Mix: 46.8 s — ABNORMAL HIGH (ref 0.0–40.4)

## 2020-12-25 LAB — DRVVT CONFIRM: dRVVT Confirm: 1.5 ratio — ABNORMAL HIGH (ref 0.8–1.2)

## 2020-12-25 LAB — HOMOCYSTEINE: Homocysteine: 14.3 umol/L (ref 0.0–19.2)

## 2020-12-26 ENCOUNTER — Other Ambulatory Visit: Payer: Self-pay | Admitting: *Deleted

## 2020-12-26 NOTE — Patient Outreach (Signed)
[facility]) Care Management  12/26/2020  [name] December 29, 1945 [id number]   Telephone Screen/Assessment-Unsuccessful Outreach #4  RN has made several attempts to reach this pt however unsuccessful. HIPAA messages left to all available contact number including spouse [name]). No response to the outreach letter sent in Oct.  Based upon no response to the message and mail-out letter will close this case from further outreach calls at this time.  Raina Mina, RN Care Management Coordinator [place] Office [phone]

## 2020-12-27 ENCOUNTER — Telehealth: Payer: Self-pay | Admitting: Family

## 2020-12-27 NOTE — Telephone Encounter (Signed)
Scheduled appt per 11/29 los - called patient to confirm .

## 2021-02-03 DIAGNOSIS — E538 Deficiency of other specified B group vitamins: Secondary | ICD-10-CM | POA: Diagnosis not present

## 2021-02-04 DIAGNOSIS — F419 Anxiety disorder, unspecified: Secondary | ICD-10-CM | POA: Diagnosis not present

## 2021-02-04 DIAGNOSIS — D509 Iron deficiency anemia, unspecified: Secondary | ICD-10-CM | POA: Diagnosis not present

## 2021-02-04 DIAGNOSIS — E559 Vitamin D deficiency, unspecified: Secondary | ICD-10-CM | POA: Diagnosis not present

## 2021-02-04 DIAGNOSIS — E876 Hypokalemia: Secondary | ICD-10-CM | POA: Diagnosis not present

## 2021-02-04 DIAGNOSIS — F329 Major depressive disorder, single episode, unspecified: Secondary | ICD-10-CM | POA: Diagnosis not present

## 2021-02-04 DIAGNOSIS — Z79899 Other long term (current) drug therapy: Secondary | ICD-10-CM | POA: Diagnosis not present

## 2021-02-10 DIAGNOSIS — Z1339 Encounter for screening examination for other mental health and behavioral disorders: Secondary | ICD-10-CM | POA: Diagnosis not present

## 2021-02-10 DIAGNOSIS — D6869 Other thrombophilia: Secondary | ICD-10-CM | POA: Diagnosis not present

## 2021-02-10 DIAGNOSIS — Z1331 Encounter for screening for depression: Secondary | ICD-10-CM | POA: Diagnosis not present

## 2021-02-10 DIAGNOSIS — G713 Mitochondrial myopathy, not elsewhere classified: Secondary | ICD-10-CM | POA: Diagnosis not present

## 2021-02-10 DIAGNOSIS — D1801 Hemangioma of skin and subcutaneous tissue: Secondary | ICD-10-CM | POA: Diagnosis not present

## 2021-02-10 DIAGNOSIS — J449 Chronic obstructive pulmonary disease, unspecified: Secondary | ICD-10-CM | POA: Diagnosis not present

## 2021-02-10 DIAGNOSIS — Z0000 Encounter for general adult medical examination without abnormal findings: Secondary | ICD-10-CM | POA: Diagnosis not present

## 2021-02-10 DIAGNOSIS — F331 Major depressive disorder, recurrent, moderate: Secondary | ICD-10-CM | POA: Diagnosis not present

## 2021-02-10 DIAGNOSIS — R432 Parageusia: Secondary | ICD-10-CM | POA: Diagnosis not present

## 2021-02-10 DIAGNOSIS — L821 Other seborrheic keratosis: Secondary | ICD-10-CM | POA: Diagnosis not present

## 2021-02-10 DIAGNOSIS — I82402 Acute embolism and thrombosis of unspecified deep veins of left lower extremity: Secondary | ICD-10-CM | POA: Diagnosis not present

## 2021-02-10 DIAGNOSIS — D45 Polycythemia vera: Secondary | ICD-10-CM | POA: Diagnosis not present

## 2021-02-10 DIAGNOSIS — D509 Iron deficiency anemia, unspecified: Secondary | ICD-10-CM | POA: Diagnosis not present

## 2021-02-10 DIAGNOSIS — M810 Age-related osteoporosis without current pathological fracture: Secondary | ICD-10-CM | POA: Diagnosis not present

## 2021-02-10 DIAGNOSIS — L820 Inflamed seborrheic keratosis: Secondary | ICD-10-CM | POA: Diagnosis not present

## 2021-02-10 DIAGNOSIS — M542 Cervicalgia: Secondary | ICD-10-CM | POA: Diagnosis not present

## 2021-02-15 ENCOUNTER — Other Ambulatory Visit: Payer: Self-pay | Admitting: Hematology & Oncology

## 2021-02-15 DIAGNOSIS — I2699 Other pulmonary embolism without acute cor pulmonale: Secondary | ICD-10-CM

## 2021-02-15 DIAGNOSIS — I82412 Acute embolism and thrombosis of left femoral vein: Secondary | ICD-10-CM

## 2021-02-21 DIAGNOSIS — M542 Cervicalgia: Secondary | ICD-10-CM | POA: Diagnosis not present

## 2021-02-24 DIAGNOSIS — M542 Cervicalgia: Secondary | ICD-10-CM | POA: Diagnosis not present

## 2021-02-25 ENCOUNTER — Other Ambulatory Visit: Payer: Self-pay

## 2021-02-25 ENCOUNTER — Inpatient Hospital Stay: Payer: Medicare Other | Attending: Hematology & Oncology

## 2021-02-25 ENCOUNTER — Inpatient Hospital Stay: Payer: Medicare Other

## 2021-02-25 ENCOUNTER — Encounter: Payer: Self-pay | Admitting: Family

## 2021-02-25 ENCOUNTER — Inpatient Hospital Stay (HOSPITAL_BASED_OUTPATIENT_CLINIC_OR_DEPARTMENT_OTHER): Payer: Medicare Other | Admitting: Family

## 2021-02-25 VITALS — BP 134/50 | HR 78

## 2021-02-25 DIAGNOSIS — M810 Age-related osteoporosis without current pathological fracture: Secondary | ICD-10-CM | POA: Insufficient documentation

## 2021-02-25 DIAGNOSIS — E7211 Homocystinuria: Secondary | ICD-10-CM

## 2021-02-25 DIAGNOSIS — Z86718 Personal history of other venous thrombosis and embolism: Secondary | ICD-10-CM | POA: Diagnosis not present

## 2021-02-25 DIAGNOSIS — D751 Secondary polycythemia: Secondary | ICD-10-CM

## 2021-02-25 DIAGNOSIS — I82422 Acute embolism and thrombosis of left iliac vein: Secondary | ICD-10-CM

## 2021-02-25 DIAGNOSIS — D45 Polycythemia vera: Secondary | ICD-10-CM | POA: Insufficient documentation

## 2021-02-25 DIAGNOSIS — D508 Other iron deficiency anemias: Secondary | ICD-10-CM | POA: Insufficient documentation

## 2021-02-25 DIAGNOSIS — Z7901 Long term (current) use of anticoagulants: Secondary | ICD-10-CM | POA: Insufficient documentation

## 2021-02-25 DIAGNOSIS — M8008XG Age-related osteoporosis with current pathological fracture, vertebra(e), subsequent encounter for fracture with delayed healing: Secondary | ICD-10-CM

## 2021-02-25 DIAGNOSIS — I2699 Other pulmonary embolism without acute cor pulmonale: Secondary | ICD-10-CM

## 2021-02-25 DIAGNOSIS — R760 Raised antibody titer: Secondary | ICD-10-CM | POA: Diagnosis not present

## 2021-02-25 LAB — CBC WITH DIFFERENTIAL (CANCER CENTER ONLY)
Abs Immature Granulocytes: 0.02 10*3/uL (ref 0.00–0.07)
Basophils Absolute: 0.1 10*3/uL (ref 0.0–0.1)
Basophils Relative: 1 %
Eosinophils Absolute: 0.1 10*3/uL (ref 0.0–0.5)
Eosinophils Relative: 2 %
HCT: 42.7 % (ref 36.0–46.0)
Hemoglobin: 14.4 g/dL (ref 12.0–15.0)
Immature Granulocytes: 0 %
Lymphocytes Relative: 26 %
Lymphs Abs: 1.7 10*3/uL (ref 0.7–4.0)
MCH: 31.5 pg (ref 26.0–34.0)
MCHC: 33.7 g/dL (ref 30.0–36.0)
MCV: 93.4 fL (ref 80.0–100.0)
Monocytes Absolute: 0.7 10*3/uL (ref 0.1–1.0)
Monocytes Relative: 10 %
Neutro Abs: 3.8 10*3/uL (ref 1.7–7.7)
Neutrophils Relative %: 61 %
Platelet Count: 144 10*3/uL — ABNORMAL LOW (ref 150–400)
RBC: 4.57 MIL/uL (ref 3.87–5.11)
RDW: 12.5 % (ref 11.5–15.5)
WBC Count: 6.4 10*3/uL (ref 4.0–10.5)
nRBC: 0 % (ref 0.0–0.2)

## 2021-02-25 LAB — CMP (CANCER CENTER ONLY)
ALT: 24 U/L (ref 0–44)
AST: 38 U/L (ref 15–41)
Albumin: 3.8 g/dL (ref 3.5–5.0)
Alkaline Phosphatase: 78 U/L (ref 38–126)
Anion gap: 9 (ref 5–15)
BUN: 12 mg/dL (ref 8–23)
CO2: 27 mmol/L (ref 22–32)
Calcium: 9.5 mg/dL (ref 8.9–10.3)
Chloride: 99 mmol/L (ref 98–111)
Creatinine: 0.61 mg/dL (ref 0.44–1.00)
GFR, Estimated: >60 mL/min (ref >60)
Glucose, Bld: 91 mg/dL (ref 70–99)
Potassium: 4.4 mmol/L (ref 3.5–5.1)
Sodium: 135 mmol/L (ref 135–145)
Total Bilirubin: 0.9 mg/dL (ref 0.3–1.2)
Total Protein: 6.8 g/dL (ref 6.5–8.1)

## 2021-02-25 LAB — IRON AND IRON BINDING CAPACITY (CC-WL,HP ONLY)
Iron: 59 ug/dL (ref 28–170)
Saturation Ratios: 14 % (ref 10.4–31.8)
TIBC: 433 ug/dL (ref 250–450)
UIBC: 374 ug/dL (ref 148–442)

## 2021-02-25 MED ORDER — ZOLEDRONIC ACID 4 MG/100ML IV SOLN
4.0000 mg | Freq: Once | INTRAVENOUS | Status: AC
  Administered 2021-02-25: 4 mg via INTRAVENOUS
  Filled 2021-02-25: qty 100

## 2021-02-25 MED ORDER — SODIUM CHLORIDE 0.9 % IV SOLN: Freq: Once | INTRAVENOUS | Status: AC

## 2021-02-25 NOTE — Progress Notes (Signed)
Hematology and Oncology Follow Up Visit  [name] [id number] 1945-08-06 76 y.o. 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 She notes that her energy and stamina seem to be improved.  She has chronic back pain unchanged from baseline.  SOB with exertion is unchanged. She rests as needed.  No fever, chills, n/v, cough, rash, dizziness, chest pain, palpitations, abdominal pain or changes in bowel or bladder habits.  No blood loss. No bruising or petechiae.  No selling, tenderness, numbness or tingling in her extremities at this time.  No falls or syncope to report.  She has maintained a good appetite and is staying well hydrated. She did not want to be weighed today.   ECOG Performance Status: 1 - Symptomatic but completely ambulatory  Medications:  Allergies as of 02/25/2021       Reactions   Dulera [mometasone Furo-formoterol Fum] Cough        Medication List        Accurate as of February 25, 2021  1:18 PM. If you have any questions, ask your nurse or doctor.          albuterol 108 (90 Base) MCG/ACT inhaler Commonly known as: VENTOLIN HFA Inhale 2 puffs into the lungs every 4 (four) hours as needed for wheezing or shortness of breath.   dicyclomine 20 MG tablet Commonly known as: BENTYL Take 1 tablet by mouth daily as needed for spasms.   doxycycline 100 MG EC tablet Commonly known as: DORYX Take 100 mg by  mouth 2 (two) times daily.   folic acid 1 MG tablet Commonly known as: FOLVITE TAKE 1 TABLET(1 MG) BY MOUTH DAILY   LORazepam 0.5 MG tablet Commonly known as: ATIVAN TAKE 1 TABLET BY MOUTH EVERY NIGHT AT BEDTIME   pantoprazole 40 MG tablet Commonly known as: PROTONIX Take 40 mg by mouth daily.   propranolol 20 MG tablet Commonly known as: INDERAL Take 20 mg by mouth daily.   sertraline 100 MG tablet Commonly known as: ZOLOFT Take 100 mg by mouth at bedtime.   Stiolto Respimat 2.5-2.5 MCG/ACT Aers Generic drug: Tiotropium Bromide-Olodaterol INHALE 2 PUFFS INTO THE LUNGS DAILY   Vitamin D (Ergocalciferol) 1.25 MG (50000 UNIT) Caps capsule Commonly known as: DRISDOL Take 1 capsule (50,000 Units total) by mouth every 7 (seven) days. Sundays   Xarelto 20 MG Tabs tablet Generic drug: rivaroxaban TAKE 1 TABLET(20 MG) BY MOUTH DAILY WITH SUPPER        Allergies:  Allergies  Allergen Reactions   Dulera [Mometasone Furo-Formoterol Fum] Cough    Past Medical History, Surgical history, Social history, and Family History were reviewed and updated.  Review of Systems: All other 10 point review of systems is negative.   Physical Exam:  vitals were not taken for this visit.   Wt Readings from Last 3 Encounters:  12/24/20 137 lb 6.4 oz (62.3 kg)  10/31/20 135 lb (61.2 kg)  10/21/20  135 lb 12.8 oz (61.6 kg)    Ocular: Sclerae unicteric, pupils equal, round and reactive to light Ear-nose-throat: Oropharynx clear, dentition fair Lymphatic: No cervical or supraclavicular adenopathy Lungs no rales or rhonchi, good excursion bilaterally Heart regular rate and rhythm, no murmur appreciated Abd soft, nontender, positive bowel sounds MSK no focal spinal tenderness, no joint edema Neuro: non-focal, well-oriented, appropriate affect Breasts: Deferred   Lab Results  Component Value Date   WBC 7.2 12/24/2020   HGB 15.2 (H) 12/24/2020   HCT 45.1 12/24/2020   MCV 95.3  12/24/2020   PLT 136 (L) 12/24/2020   Lab Results  Component Value Date   FERRITIN 23 12/24/2020   IRON 93 12/24/2020   TIBC 391 12/24/2020   UIBC 298 12/24/2020   IRONPCTSAT 24 12/24/2020   Lab Results  Component Value Date   RETICCTPCT 1.5 06/12/2014   RBC 4.73 12/24/2020   RETICCTABS 65.9 06/12/2014   Lab Results  Component Value Date   KAPLAMBRATIO 0.91 04/27/2016   Lab Results  Component Value Date   IGGSERUM 897 04/27/2016   IGA 339 02/27/2014   IGMSERUM 132 04/27/2016   Lab Results  Component Value Date   MSPIKE Not Observed 04/27/2016     Chemistry      Component Value Date/Time   NA 135 12/24/2020 1317   NA 144 01/20/2017 1344   NA 138 01/17/2016 1356   K 3.8 12/24/2020 1317   K 3.6 01/20/2017 1344   K 4.1 01/17/2016 1356   CL 98 12/24/2020 1317   CL 107 01/20/2017 1344   CO2 26 12/24/2020 1317   CO2 25 01/20/2017 1344   CO2 25 01/17/2016 1356   BUN 10 12/24/2020 1317   BUN 12 01/20/2017 1344   BUN 12.3 01/17/2016 1356   CREATININE 0.61 12/24/2020 1317   CREATININE 0.7 01/20/2017 1344   CREATININE 0.8 01/17/2016 1356      Component Value Date/Time   CALCIUM 9.6 12/24/2020 1317   CALCIUM 8.9 01/20/2017 1344   CALCIUM 9.1 01/17/2016 1356   ALKPHOS 93 12/24/2020 1317   ALKPHOS 68 01/20/2017 1344   ALKPHOS 87 01/17/2016 1356   AST 38 12/24/2020 1317   AST 48 (H) 01/17/2016 1356   ALT 27 12/24/2020 1317   ALT 49 (H) 01/20/2017 1344   ALT 38 01/17/2016 1356   BILITOT 0.9 12/24/2020 1317   BILITOT 0.67 01/17/2016 1356       Impression and Plan: [name] is a very pleasant 76 yo caucasian female with polycythemia, 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 She was diagnosed with extensive left lower extremity DVT and small right PE in February 2021 (resolved on CT angio and [place] in June 2021). She has had a mildly elevated homocystine level and positive lupus anticoagulant. She is on lifelong anticoagulant therapy.   No phlebotomy needed at this time, Hct 42.7%. Iron studies are pending.  She will receive Zometa today as planned. Calcium is 9.5.  Follow-up in 2 months.   Faith Dawson, NP 1/31/20231:18 PM

## 2021-02-26 ENCOUNTER — Telehealth: Payer: Self-pay | Admitting: *Deleted

## 2021-02-26 DIAGNOSIS — M542 Cervicalgia: Secondary | ICD-10-CM | POA: Diagnosis not present

## 2021-02-26 LAB — FERRITIN: Ferritin: 18 ng/mL (ref 11–307)

## 2021-02-26 LAB — HOMOCYSTEINE: Homocysteine: 17.3 umol/L (ref 0.0–19.2)

## 2021-02-26 NOTE — Telephone Encounter (Signed)
Per 02/25/21 los - called and gave upcoming appointments - confirmed

## 2021-03-05 DIAGNOSIS — M542 Cervicalgia: Secondary | ICD-10-CM | POA: Diagnosis not present

## 2021-03-07 DIAGNOSIS — M542 Cervicalgia: Secondary | ICD-10-CM | POA: Diagnosis not present

## 2021-03-14 DIAGNOSIS — M542 Cervicalgia: Secondary | ICD-10-CM | POA: Diagnosis not present

## 2021-03-18 DIAGNOSIS — M542 Cervicalgia: Secondary | ICD-10-CM | POA: Diagnosis not present

## 2021-03-20 DIAGNOSIS — M542 Cervicalgia: Secondary | ICD-10-CM | POA: Diagnosis not present

## 2021-03-24 DIAGNOSIS — M542 Cervicalgia: Secondary | ICD-10-CM | POA: Diagnosis not present

## 2021-03-31 DIAGNOSIS — M542 Cervicalgia: Secondary | ICD-10-CM | POA: Diagnosis not present

## 2021-04-02 DIAGNOSIS — M542 Cervicalgia: Secondary | ICD-10-CM | POA: Diagnosis not present

## 2021-04-03 ENCOUNTER — Telehealth: Payer: Self-pay | Admitting: Emergency Medicine

## 2021-04-03 NOTE — Telephone Encounter (Signed)
ATC patient x1.  Left detailed message (per DPR) letting her know that we have her correct pharmacy on file:  [facility].  Advised to call with any further questions or concerns. ?

## 2021-04-07 DIAGNOSIS — M542 Cervicalgia: Secondary | ICD-10-CM | POA: Diagnosis not present

## 2021-04-11 DIAGNOSIS — M542 Cervicalgia: Secondary | ICD-10-CM | POA: Diagnosis not present

## 2021-04-15 DIAGNOSIS — M542 Cervicalgia: Secondary | ICD-10-CM | POA: Diagnosis not present

## 2021-04-18 DIAGNOSIS — M542 Cervicalgia: Secondary | ICD-10-CM | POA: Diagnosis not present

## 2021-04-21 DIAGNOSIS — M542 Cervicalgia: Secondary | ICD-10-CM | POA: Diagnosis not present

## 2021-04-25 ENCOUNTER — Other Ambulatory Visit: Payer: Self-pay

## 2021-04-25 ENCOUNTER — Inpatient Hospital Stay: Payer: Medicare Other | Attending: Hematology & Oncology

## 2021-04-25 ENCOUNTER — Inpatient Hospital Stay (HOSPITAL_BASED_OUTPATIENT_CLINIC_OR_DEPARTMENT_OTHER): Payer: Medicare Other | Admitting: Family

## 2021-04-25 ENCOUNTER — Inpatient Hospital Stay: Payer: Medicare Other

## 2021-04-25 ENCOUNTER — Encounter: Payer: Self-pay | Admitting: Family

## 2021-04-25 DIAGNOSIS — I82422 Acute embolism and thrombosis of left iliac vein: Secondary | ICD-10-CM

## 2021-04-25 DIAGNOSIS — Z86718 Personal history of other venous thrombosis and embolism: Secondary | ICD-10-CM | POA: Diagnosis not present

## 2021-04-25 DIAGNOSIS — J45909 Unspecified asthma, uncomplicated: Secondary | ICD-10-CM | POA: Diagnosis not present

## 2021-04-25 DIAGNOSIS — M810 Age-related osteoporosis without current pathological fracture: Secondary | ICD-10-CM | POA: Insufficient documentation

## 2021-04-25 DIAGNOSIS — E7211 Homocystinuria: Secondary | ICD-10-CM | POA: Diagnosis not present

## 2021-04-25 DIAGNOSIS — D751 Secondary polycythemia: Secondary | ICD-10-CM | POA: Diagnosis not present

## 2021-04-25 DIAGNOSIS — D45 Polycythemia vera: Secondary | ICD-10-CM | POA: Insufficient documentation

## 2021-04-25 DIAGNOSIS — Z7901 Long term (current) use of anticoagulants: Secondary | ICD-10-CM | POA: Diagnosis not present

## 2021-04-25 DIAGNOSIS — I2699 Other pulmonary embolism without acute cor pulmonale: Secondary | ICD-10-CM

## 2021-04-25 DIAGNOSIS — G713 Mitochondrial myopathy, not elsewhere classified: Secondary | ICD-10-CM | POA: Diagnosis not present

## 2021-04-25 DIAGNOSIS — R760 Raised antibody titer: Secondary | ICD-10-CM

## 2021-04-25 DIAGNOSIS — J449 Chronic obstructive pulmonary disease, unspecified: Secondary | ICD-10-CM | POA: Diagnosis not present

## 2021-04-25 DIAGNOSIS — F329 Major depressive disorder, single episode, unspecified: Secondary | ICD-10-CM | POA: Diagnosis not present

## 2021-04-25 DIAGNOSIS — D509 Iron deficiency anemia, unspecified: Secondary | ICD-10-CM | POA: Diagnosis not present

## 2021-04-25 LAB — CBC WITH DIFFERENTIAL (CANCER CENTER ONLY)
Abs Immature Granulocytes: 0.03 10*3/uL (ref 0.00–0.07)
Basophils Absolute: 0 10*3/uL (ref 0.0–0.1)
Basophils Relative: 1 %
Eosinophils Absolute: 0.2 10*3/uL (ref 0.0–0.5)
Eosinophils Relative: 2 %
HCT: 43.6 % (ref 36.0–46.0)
Hemoglobin: 14.8 g/dL (ref 12.0–15.0)
Immature Granulocytes: 0 %
Lymphocytes Relative: 25 %
Lymphs Abs: 1.7 10*3/uL (ref 0.7–4.0)
MCH: 31.6 pg (ref 26.0–34.0)
MCHC: 33.9 g/dL (ref 30.0–36.0)
MCV: 93 fL (ref 80.0–100.0)
Monocytes Absolute: 0.7 10*3/uL (ref 0.1–1.0)
Monocytes Relative: 10 %
Neutro Abs: 4.4 10*3/uL (ref 1.7–7.7)
Neutrophils Relative %: 62 %
Platelet Count: 138 10*3/uL — ABNORMAL LOW (ref 150–400)
RBC: 4.69 MIL/uL (ref 3.87–5.11)
RDW: 13.7 % (ref 11.5–15.5)
WBC Count: 7 10*3/uL (ref 4.0–10.5)
nRBC: 0 % (ref 0.0–0.2)

## 2021-04-25 LAB — CMP (CANCER CENTER ONLY)
ALT: 23 U/L (ref 0–44)
AST: 33 U/L (ref 15–41)
Albumin: 3.8 g/dL (ref 3.5–5.0)
Alkaline Phosphatase: 71 U/L (ref 38–126)
Anion gap: 9 (ref 5–15)
BUN: 10 mg/dL (ref 8–23)
CO2: 28 mmol/L (ref 22–32)
Calcium: 9.5 mg/dL (ref 8.9–10.3)
Chloride: 100 mmol/L (ref 98–111)
Creatinine: 0.65 mg/dL (ref 0.44–1.00)
GFR, Estimated: >60 mL/min (ref >60)
Glucose, Bld: 90 mg/dL (ref 70–99)
Potassium: 4.4 mmol/L (ref 3.5–5.1)
Sodium: 137 mmol/L (ref 135–145)
Total Bilirubin: 1 mg/dL (ref 0.3–1.2)
Total Protein: 7.1 g/dL (ref 6.5–8.1)

## 2021-04-25 LAB — IRON AND IRON BINDING CAPACITY (CC-WL,HP ONLY)
Iron: 106 ug/dL (ref 28–170)
Saturation Ratios: 26 % (ref 10.4–31.8)
TIBC: 412 ug/dL (ref 250–450)
UIBC: 306 ug/dL (ref 148–442)

## 2021-04-25 NOTE — Progress Notes (Signed)
?Hematology and Oncology Follow Up Visit ? ?[name] ?[id number] ?1945/08/28 76 y.o. ?04/25/2021 ? ? 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  ?Current Therapy:        ?Xarelto 20 mg PO daily - Lifelong ?Phlebotomy to maintain hematocrit below 45% ?IV iron as indicated  ?Zometa IV every 6 months - due again 07/2021 ?  ?Interim History:  Ms. Rowen is here today for follow-up. She is doing fairly well.  ?She has chronic fatigue but has started doing PT 2 days a week. She notes that her stamina does seem to have improved.  ?Hct today is stable at 43%.  ?Homocystine level last month was 17.3.  ?She has SOB with exacerbation of COPD.  ?No fever, chills, n/v, cough, rash, dizziness, chest pain, palpitations, abdominal pain or changes in bowel or bladder habits.  ?Intermittent numbness and tingling in her hands and feet is unchanged from baseline.  ?No swelling in her extremities.  ?No falls or syncope to report.  ?She is eating well and staying properly hydrated throughout the day. Her weight is stable at 140 lbs.  ? ?ECOG Performance Status: 1 - Symptomatic but completely ambulatory ? ?Medications:  ?Allergies as of 04/25/2021   ? ?   Reactions  ? Dulera [mometasone Furo-formoterol Fum] Cough  ? ?  ? ?  ?Medication List  ?  ? ?  ? Accurate as of April 25, 2021  1:14 PM. If you have any questions, ask your nurse or doctor.  ?  ?  ? ?  ? ?albuterol 108 (90 Base) MCG/ACT inhaler ?Commonly known as: VENTOLIN HFA ?Inhale 2 puffs into the lungs every 4 (four) hours as needed for wheezing or shortness of breath. ?  ?dicyclomine 20 MG tablet ?Commonly known as: BENTYL ?Take 1 tablet by mouth daily as needed for spasms. ?   ?folic acid 1 MG tablet ?Commonly known as: FOLVITE ?TAKE 1 TABLET(1 MG) BY MOUTH DAILY ?  ?LORazepam 0.5 MG tablet ?Commonly known as: ATIVAN ?TAKE 1 TABLET BY MOUTH EVERY NIGHT AT BEDTIME ?  ?pantoprazole 40 MG tablet ?Commonly known as: PROTONIX ?Take 40 mg by mouth daily. ?  ?propranolol 20 MG tablet ?Commonly known as: INDERAL ?Take 20 mg by mouth daily. ?  ?sertraline 100 MG tablet ?Commonly known as: ZOLOFT ?Take 100 mg by mouth at bedtime. ?  ?Stiolto Respimat 2.5-2.5 MCG/ACT Aers ?Generic drug: Tiotropium Bromide-Olodaterol ?INHALE 2 PUFFS INTO THE LUNGS DAILY ?  ?Vitamin D (Ergocalciferol) 1.25 MG (50000 UNIT) Caps capsule ?Commonly known as: DRISDOL ?Take 1 capsule (50,000 Units total) by mouth every 7 (seven) days. Sundays ?  ?Xarelto 20 MG Tabs tablet ?Generic drug: rivaroxaban ?TAKE 1 TABLET(20 MG) BY MOUTH DAILY WITH SUPPER ?  ? ?  ? ? ?Allergies:  ?Allergies  ?Allergen Reactions  ? Dulera [Mometasone Furo-Formoterol Fum] Cough  ? ? ?Past Medical History, Surgical history, Social history, and Family History were reviewed and updated. ? ?Review of Systems: ?All other 10 point review of systems is negative.  ? ?Physical Exam: ? vitals were not taken for this visit.  ? ?Wt Readings from Last 3 Encounters:  ?12/24/20 137 lb 6.4 oz (62.3 kg)  ?10/31/20 135 lb (61.2 kg)  ?10/21/20 135 lb 12.8 oz (  61.6 kg)  ? ? ?Ocular: Sclerae unicteric, pupils equal, round and reactive to light ?Ear-nose-throat: Oropharynx clear, dentition fair ?Lymphatic: No cervical or supraclavicular adenopathy ?Lungs no rales or rhonchi, good excursion bilaterally ?Heart regular rate and rhythm, no murmur appreciated ?Abd soft, nontender, positive bowel sounds ?MSK no focal spinal tenderness, no joint edema ?Neuro: non-focal, well-oriented, appropriate affect ?Breasts: Deferred  ? ?Lab Results  ?Component Value Date  ? WBC 7.0 04/25/2021  ? HGB 14.8 04/25/2021  ? HCT 43.6 04/25/2021  ? MCV 93.0 04/25/2021  ? PLT 138 (L) 04/25/2021   ? ?Lab Results  ?Component Value Date  ? FERRITIN 18 02/25/2021  ? IRON 59 02/25/2021  ? TIBC 433 02/25/2021  ? UIBC 374 02/25/2021  ? IRONPCTSAT 14 02/25/2021  ? ?Lab Results  ?Component Value Date  ? RETICCTPCT 1.5 06/12/2014  ? RBC 4.69 04/25/2021  ? RETICCTABS 65.9 06/12/2014  ? ?Lab Results  ?Component Value Date  ? KAPLAMBRATIO 0.91 04/27/2016  ? ?Lab Results  ?Component Value Date  ? IGGSERUM 897 04/27/2016  ? IGA 339 02/27/2014  ? IGMSERUM 132 04/27/2016  ? ?Lab Results  ?Component Value Date  ? MSPIKE Not Observed 04/27/2016  ? ?  Chemistry   ?   ?Component Value Date/Time  ? NA 135 02/25/2021 1306  ? NA 144 01/20/2017 1344  ? NA 138 01/17/2016 1356  ? K 4.4 02/25/2021 1306  ? K 3.6 01/20/2017 1344  ? K 4.1 01/17/2016 1356  ? CL 99 02/25/2021 1306  ? CL 107 01/20/2017 1344  ? CO2 27 02/25/2021 1306  ? CO2 25 01/20/2017 1344  ? CO2 25 01/17/2016 1356  ? BUN 12 02/25/2021 1306  ? BUN 12 01/20/2017 1344  ? BUN 12.3 01/17/2016 1356  ? CREATININE 0.61 02/25/2021 1306  ? CREATININE 0.7 01/20/2017 1344  ? CREATININE 0.8 01/17/2016 1356  ?    ?Component Value Date/Time  ? CALCIUM 9.5 02/25/2021 1306  ? CALCIUM 8.9 01/20/2017 1344  ? CALCIUM 9.1 01/17/2016 1356  ? ALKPHOS 78 02/25/2021 1306  ? ALKPHOS 68 01/20/2017 1344  ? ALKPHOS 87 01/17/2016 1356  ? AST 38 02/25/2021 1306  ? AST 48 (H) 01/17/2016 1356  ? ALT 24 02/25/2021 1306  ? ALT 49 (H) 01/20/2017 1344  ? ALT 38 01/17/2016 1356  ? BILITOT 0.9 02/25/2021 1306  ? BILITOT 0.67 01/17/2016 1356  ?  ? ? ? ?Impression and Plan: Ms. Zellmer is a very pleasant 76 yo caucasian female with polycythemia, 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 She was diagnosed with extensive left lower extremity DVT and small right PE in February 2021 (resolved on CT angio and [place] in June 2021). ?She has had a mildly elevated homocystine level and positive lupus anticoagulant. She is on lifelong anticoagulant therapy.  ?No phlebotomy today, Hct 43.6%.   ?Iron deficiency anemia.  ?Follow-up in 2 months.  ? ?Lottie Dawson, NP ?3/31/20231:14 PM ? ?

## 2021-04-27 ENCOUNTER — Encounter: Payer: Self-pay | Admitting: Family

## 2021-04-28 LAB — FERRITIN: Ferritin: 24 ng/mL (ref 11–307)

## 2021-04-28 LAB — HOMOCYSTEINE: Homocysteine: 16.4 umol/L (ref 0.0–19.2)

## 2021-04-29 DIAGNOSIS — M542 Cervicalgia: Secondary | ICD-10-CM | POA: Diagnosis not present

## 2021-05-02 ENCOUNTER — Telehealth: Payer: Self-pay | Admitting: *Deleted

## 2021-05-02 NOTE — Telephone Encounter (Signed)
Per 06/25/21 los - called and gave upcoming appointments - confirmed ?

## 2021-05-06 DIAGNOSIS — M542 Cervicalgia: Secondary | ICD-10-CM | POA: Diagnosis not present

## 2021-05-08 ENCOUNTER — Other Ambulatory Visit: Payer: Self-pay | Admitting: Nurse Practitioner

## 2021-05-12 DIAGNOSIS — M542 Cervicalgia: Secondary | ICD-10-CM | POA: Diagnosis not present

## 2021-05-16 ENCOUNTER — Other Ambulatory Visit: Payer: Self-pay | Admitting: Hematology & Oncology

## 2021-05-16 DIAGNOSIS — I82412 Acute embolism and thrombosis of left femoral vein: Secondary | ICD-10-CM

## 2021-05-16 DIAGNOSIS — I2699 Other pulmonary embolism without acute cor pulmonale: Secondary | ICD-10-CM

## 2021-05-19 DIAGNOSIS — M542 Cervicalgia: Secondary | ICD-10-CM | POA: Diagnosis not present

## 2021-05-24 ENCOUNTER — Other Ambulatory Visit: Payer: Self-pay | Admitting: Nurse Practitioner

## 2021-05-29 DIAGNOSIS — M542 Cervicalgia: Secondary | ICD-10-CM | POA: Diagnosis not present

## 2021-05-30 DIAGNOSIS — Z23 Encounter for immunization: Secondary | ICD-10-CM | POA: Diagnosis not present

## 2021-06-02 DIAGNOSIS — M542 Cervicalgia: Secondary | ICD-10-CM | POA: Diagnosis not present

## 2021-06-03 ENCOUNTER — Other Ambulatory Visit: Payer: Self-pay | Admitting: Registered Nurse

## 2021-06-03 ENCOUNTER — Inpatient Hospital Stay (HOSPITAL_COMMUNITY)
Admission: EM | Admit: 2021-06-03 | Discharge: 2021-06-06 | DRG: 418 | Disposition: A | Discharge status: Home / Self Care | Payer: Medicare Other | Attending: Family Medicine | Admitting: Family Medicine

## 2021-06-03 ENCOUNTER — Other Ambulatory Visit (HOSPITAL_BASED_OUTPATIENT_CLINIC_OR_DEPARTMENT_OTHER): Payer: Self-pay | Admitting: Registered Nurse

## 2021-06-03 ENCOUNTER — Ambulatory Visit (HOSPITAL_COMMUNITY)
Admission: RE | Admit: 2021-06-03 | Discharge: 2021-06-03 | Disposition: A | Discharge status: Home / Self Care | Payer: Medicare Other | Source: Ambulatory Visit | Attending: Registered Nurse | Admitting: Registered Nurse

## 2021-06-03 ENCOUNTER — Other Ambulatory Visit: Payer: Self-pay

## 2021-06-03 ENCOUNTER — Encounter (HOSPITAL_COMMUNITY): Payer: Self-pay | Admitting: Internal Medicine

## 2021-06-03 DIAGNOSIS — K7689 Other specified diseases of liver: Secondary | ICD-10-CM | POA: Diagnosis not present

## 2021-06-03 DIAGNOSIS — I10 Essential (primary) hypertension: Secondary | ICD-10-CM | POA: Diagnosis present

## 2021-06-03 DIAGNOSIS — Z888 Allergy status to other drugs, medicaments and biological substances status: Secondary | ICD-10-CM

## 2021-06-03 DIAGNOSIS — J449 Chronic obstructive pulmonary disease, unspecified: Secondary | ICD-10-CM | POA: Diagnosis not present

## 2021-06-03 DIAGNOSIS — K810 Acute cholecystitis: Principal | ICD-10-CM | POA: Diagnosis present

## 2021-06-03 DIAGNOSIS — Z7901 Long term (current) use of anticoagulants: Secondary | ICD-10-CM

## 2021-06-03 DIAGNOSIS — F411 Generalized anxiety disorder: Secondary | ICD-10-CM | POA: Diagnosis present

## 2021-06-03 DIAGNOSIS — R17 Unspecified jaundice: Secondary | ICD-10-CM | POA: Diagnosis not present

## 2021-06-03 DIAGNOSIS — K7460 Unspecified cirrhosis of liver: Secondary | ICD-10-CM | POA: Diagnosis present

## 2021-06-03 DIAGNOSIS — Z66 Do not resuscitate: Secondary | ICD-10-CM | POA: Diagnosis present

## 2021-06-03 DIAGNOSIS — K828 Other specified diseases of gallbladder: Secondary | ICD-10-CM | POA: Diagnosis not present

## 2021-06-03 DIAGNOSIS — E871 Hypo-osmolality and hyponatremia: Secondary | ICD-10-CM | POA: Diagnosis not present

## 2021-06-03 DIAGNOSIS — D45 Polycythemia vera: Secondary | ICD-10-CM | POA: Diagnosis present

## 2021-06-03 DIAGNOSIS — D510 Vitamin B12 deficiency anemia due to intrinsic factor deficiency: Secondary | ICD-10-CM | POA: Diagnosis present

## 2021-06-03 DIAGNOSIS — K8000 Calculus of gallbladder with acute cholecystitis without obstruction: Principal | ICD-10-CM | POA: Diagnosis present

## 2021-06-03 DIAGNOSIS — G4700 Insomnia, unspecified: Secondary | ICD-10-CM | POA: Diagnosis present

## 2021-06-03 DIAGNOSIS — M810 Age-related osteoporosis without current pathological fracture: Secondary | ICD-10-CM | POA: Diagnosis present

## 2021-06-03 DIAGNOSIS — R748 Abnormal levels of other serum enzymes: Secondary | ICD-10-CM | POA: Diagnosis not present

## 2021-06-03 DIAGNOSIS — D6862 Lupus anticoagulant syndrome: Secondary | ICD-10-CM | POA: Diagnosis present

## 2021-06-03 DIAGNOSIS — R1011 Right upper quadrant pain: Secondary | ICD-10-CM | POA: Diagnosis not present

## 2021-06-03 DIAGNOSIS — Z9842 Cataract extraction status, left eye: Secondary | ICD-10-CM

## 2021-06-03 DIAGNOSIS — R251 Tremor, unspecified: Secondary | ICD-10-CM | POA: Diagnosis present

## 2021-06-03 DIAGNOSIS — K760 Fatty (change of) liver, not elsewhere classified: Secondary | ICD-10-CM | POA: Diagnosis present

## 2021-06-03 DIAGNOSIS — K589 Irritable bowel syndrome without diarrhea: Secondary | ICD-10-CM | POA: Diagnosis not present

## 2021-06-03 DIAGNOSIS — Z79899 Other long term (current) drug therapy: Secondary | ICD-10-CM

## 2021-06-03 DIAGNOSIS — Z825 Family history of asthma and other chronic lower respiratory diseases: Secondary | ICD-10-CM | POA: Diagnosis not present

## 2021-06-03 DIAGNOSIS — R945 Abnormal results of liver function studies: Secondary | ICD-10-CM | POA: Diagnosis not present

## 2021-06-03 DIAGNOSIS — Z86718 Personal history of other venous thrombosis and embolism: Secondary | ICD-10-CM

## 2021-06-03 DIAGNOSIS — Z961 Presence of intraocular lens: Secondary | ICD-10-CM | POA: Diagnosis present

## 2021-06-03 DIAGNOSIS — K8012 Calculus of gallbladder with acute and chronic cholecystitis without obstruction: Secondary | ICD-10-CM | POA: Diagnosis not present

## 2021-06-03 DIAGNOSIS — I252 Old myocardial infarction: Secondary | ICD-10-CM

## 2021-06-03 DIAGNOSIS — Z818 Family history of other mental and behavioral disorders: Secondary | ICD-10-CM

## 2021-06-03 DIAGNOSIS — G43909 Migraine, unspecified, not intractable, without status migrainosus: Secondary | ICD-10-CM | POA: Diagnosis present

## 2021-06-03 DIAGNOSIS — K8010 Calculus of gallbladder with chronic cholecystitis without obstruction: Secondary | ICD-10-CM | POA: Diagnosis not present

## 2021-06-03 DIAGNOSIS — F418 Other specified anxiety disorders: Secondary | ICD-10-CM | POA: Diagnosis not present

## 2021-06-03 DIAGNOSIS — K219 Gastro-esophageal reflux disease without esophagitis: Secondary | ICD-10-CM | POA: Diagnosis not present

## 2021-06-03 DIAGNOSIS — Z9841 Cataract extraction status, right eye: Secondary | ICD-10-CM

## 2021-06-03 DIAGNOSIS — Z86711 Personal history of pulmonary embolism: Secondary | ICD-10-CM

## 2021-06-03 DIAGNOSIS — E876 Hypokalemia: Secondary | ICD-10-CM | POA: Diagnosis not present

## 2021-06-03 DIAGNOSIS — E162 Hypoglycemia, unspecified: Secondary | ICD-10-CM | POA: Diagnosis not present

## 2021-06-03 DIAGNOSIS — R112 Nausea with vomiting, unspecified: Secondary | ICD-10-CM | POA: Diagnosis not present

## 2021-06-03 DIAGNOSIS — K8020 Calculus of gallbladder without cholecystitis without obstruction: Secondary | ICD-10-CM | POA: Diagnosis not present

## 2021-06-03 LAB — COMPREHENSIVE METABOLIC PANEL
ALT: 334 U/L — ABNORMAL HIGH (ref 0–44)
AST: 492 U/L — ABNORMAL HIGH (ref 15–41)
Albumin: 3.3 g/dL — ABNORMAL LOW (ref 3.5–5.0)
Alkaline Phosphatase: 201 U/L — ABNORMAL HIGH (ref 38–126)
Anion gap: 10 (ref 5–15)
BUN: 8 mg/dL (ref 8–23)
CO2: 26 mmol/L (ref 22–32)
Calcium: 8.8 mg/dL — ABNORMAL LOW (ref 8.9–10.3)
Chloride: 99 mmol/L (ref 98–111)
Creatinine, Ser: 0.54 mg/dL (ref 0.44–1.00)
GFR, Estimated: >60 mL/min (ref >60)
Glucose, Bld: 125 mg/dL — ABNORMAL HIGH (ref 70–99)
Potassium: 3.4 mmol/L — ABNORMAL LOW (ref 3.5–5.1)
Sodium: 135 mmol/L (ref 135–145)
Total Bilirubin: 6.1 mg/dL — ABNORMAL HIGH (ref 0.3–1.2)
Total Protein: 6.8 g/dL (ref 6.5–8.1)

## 2021-06-03 LAB — CBC WITH DIFFERENTIAL/PLATELET
Abs Immature Granulocytes: 0.03 10*3/uL (ref 0.00–0.07)
Basophils Absolute: 0 10*3/uL (ref 0.0–0.1)
Basophils Relative: 0 %
Eosinophils Absolute: 0 10*3/uL (ref 0.0–0.5)
Eosinophils Relative: 0 %
HCT: 43.8 % (ref 36.0–46.0)
Hemoglobin: 15.5 g/dL — ABNORMAL HIGH (ref 12.0–15.0)
Immature Granulocytes: 0 %
Lymphocytes Relative: 6 %
Lymphs Abs: 0.5 10*3/uL — ABNORMAL LOW (ref 0.7–4.0)
MCH: 33.1 pg (ref 26.0–34.0)
MCHC: 35.4 g/dL (ref 30.0–36.0)
MCV: 93.6 fL (ref 80.0–100.0)
Monocytes Absolute: 0.8 10*3/uL (ref 0.1–1.0)
Monocytes Relative: 8 %
Neutro Abs: 7.9 10*3/uL — ABNORMAL HIGH (ref 1.7–7.7)
Neutrophils Relative %: 86 %
Platelets: 112 10*3/uL — ABNORMAL LOW (ref 150–400)
RBC: 4.68 MIL/uL (ref 3.87–5.11)
RDW: 14.3 % (ref 11.5–15.5)
WBC: 9.3 10*3/uL (ref 4.0–10.5)
nRBC: 0 % (ref 0.0–0.2)

## 2021-06-03 LAB — URINALYSIS, ROUTINE W REFLEX MICROSCOPIC
Bacteria, UA: NONE SEEN
Bilirubin Urine: NEGATIVE
Glucose, UA: NEGATIVE mg/dL
Ketones, ur: NEGATIVE mg/dL
Leukocytes,Ua: NEGATIVE
Nitrite: NEGATIVE
Protein, ur: NEGATIVE mg/dL
Specific Gravity, Urine: 1.009 (ref 1.005–1.030)
pH: 7 (ref 5.0–8.0)

## 2021-06-03 LAB — LIPASE, BLOOD: Lipase: 25 U/L (ref 11–51)

## 2021-06-03 LAB — MAGNESIUM: Magnesium: 1.7 mg/dL (ref 1.7–2.4)

## 2021-06-03 MED ORDER — ARFORMOTEROL TARTRATE 15 MCG/2ML IN NEBU
15.0000 ug | INHALATION_SOLUTION | Freq: Two times a day (BID) | RESPIRATORY_TRACT | Status: DC
Start: 2021-06-03 — End: 2021-06-06
  Administered 2021-06-04 – 2021-06-06 (×4): 15 ug via RESPIRATORY_TRACT
  Filled 2021-06-03 (×4): qty 2

## 2021-06-03 MED ORDER — SODIUM CHLORIDE 0.9 % IV SOLN: INTRAVENOUS | Status: AC

## 2021-06-03 MED ORDER — PROCHLORPERAZINE EDISYLATE 10 MG/2ML IJ SOLN
10.0000 mg | Freq: Once | INTRAMUSCULAR | Status: AC
  Administered 2021-06-03: 10 mg via INTRAVENOUS
  Filled 2021-06-03: qty 2

## 2021-06-03 MED ORDER — HYDROMORPHONE HCL 1 MG/ML IJ SOLN
0.5000 mg | INTRAMUSCULAR | Status: DC | PRN
  Administered 2021-06-03 – 2021-06-05 (×5): 1 mg via INTRAVENOUS
  Administered 2021-06-06: 0.5 mg via INTRAVENOUS
  Filled 2021-06-03 (×6): qty 1

## 2021-06-03 MED ORDER — ACETAMINOPHEN 650 MG RE SUPP
650.0000 mg | Freq: Four times a day (QID) | RECTAL | Status: DC | PRN
  Filled 2021-06-03: qty 1

## 2021-06-03 MED ORDER — FENTANYL CITRATE PF 50 MCG/ML IJ SOSY
50.0000 ug | PREFILLED_SYRINGE | Freq: Once | INTRAMUSCULAR | Status: AC
  Administered 2021-06-03: 50 ug via INTRAVENOUS
  Filled 2021-06-03: qty 1

## 2021-06-03 MED ORDER — DIPHENHYDRAMINE HCL 50 MG/ML IJ SOLN
12.5000 mg | Freq: Once | INTRAMUSCULAR | Status: AC
  Administered 2021-06-03: 12.5 mg via INTRAVENOUS
  Filled 2021-06-03: qty 1

## 2021-06-03 MED ORDER — ONDANSETRON HCL 4 MG/2ML IJ SOLN: 4.0000 mg | Freq: Four times a day (QID) | INTRAMUSCULAR | Status: DC | PRN

## 2021-06-03 MED ORDER — POLYETHYLENE GLYCOL 3350 17 G PO PACK: 17.0000 g | PACK | Freq: Every day | ORAL | Status: DC | PRN

## 2021-06-03 MED ORDER — PROPRANOLOL HCL 20 MG PO TABS
20.0000 mg | ORAL_TABLET | Freq: Every day | ORAL | Status: DC
  Administered 2021-06-04 – 2021-06-06 (×3): 20 mg via ORAL
  Filled 2021-06-03 (×3): qty 1

## 2021-06-03 MED ORDER — PANTOPRAZOLE SODIUM 40 MG PO TBEC
40.0000 mg | DELAYED_RELEASE_TABLET | Freq: Every day | ORAL | Status: DC
  Administered 2021-06-04 – 2021-06-06 (×2): 40 mg via ORAL
  Filled 2021-06-03 (×2): qty 1

## 2021-06-03 MED ORDER — ACETAMINOPHEN 325 MG PO TABS
650.0000 mg | ORAL_TABLET | Freq: Four times a day (QID) | ORAL | Status: DC | PRN
  Administered 2021-06-04 (×2): 650 mg via ORAL
  Filled 2021-06-03 (×2): qty 2

## 2021-06-03 MED ORDER — DICYCLOMINE HCL 20 MG PO TABS: 20.0000 mg | ORAL_TABLET | Freq: Every day | ORAL | Status: DC | PRN

## 2021-06-03 MED ORDER — LORAZEPAM 0.5 MG PO TABS: 0.5000 mg | ORAL_TABLET | Freq: Every evening | ORAL | Status: DC | PRN

## 2021-06-03 MED ORDER — POTASSIUM CHLORIDE 20 MEQ PO PACK
20.0000 meq | PACK | Freq: Once | ORAL | Status: AC
  Administered 2021-06-03: 20 meq via ORAL
  Filled 2021-06-03: qty 1

## 2021-06-03 MED ORDER — UMECLIDINIUM BROMIDE 62.5 MCG/ACT IN AEPB
1.0000 | INHALATION_SPRAY | Freq: Every day | RESPIRATORY_TRACT | Status: DC
Start: 2021-06-03 — End: 2021-06-06
  Administered 2021-06-04 – 2021-06-06 (×3): 1 via RESPIRATORY_TRACT
  Filled 2021-06-03: qty 7

## 2021-06-03 MED ORDER — SODIUM CHLORIDE 0.9 % IV BOLUS
1000.0000 mL | Freq: Once | INTRAVENOUS | Status: AC
  Administered 2021-06-03: 1000 mL via INTRAVENOUS

## 2021-06-03 MED ORDER — SODIUM CHLORIDE 0.9 % IV SOLN
2.0000 g | INTRAVENOUS | Status: DC
  Administered 2021-06-04 – 2021-06-05 (×2): 2 g via INTRAVENOUS
  Filled 2021-06-03 (×2): qty 20

## 2021-06-03 MED ORDER — SODIUM CHLORIDE 0.9 % IV SOLN
2.0000 g | Freq: Once | INTRAVENOUS | Status: AC
  Administered 2021-06-03: 2 g via INTRAVENOUS
  Filled 2021-06-03: qty 20

## 2021-06-03 MED ORDER — SODIUM CHLORIDE 0.9% FLUSH
3.0000 mL | Freq: Two times a day (BID) | INTRAVENOUS | Status: DC
  Administered 2021-06-04 – 2021-06-06 (×2): 3 mL via INTRAVENOUS

## 2021-06-03 MED ORDER — ALBUTEROL SULFATE (2.5 MG/3ML) 0.083% IN NEBU: 2.5000 mg | INHALATION_SOLUTION | RESPIRATORY_TRACT | Status: DC | PRN

## 2021-06-03 NOTE — H&P (Addendum)
?History and Physical  ? ?[name] KYH:[id number] DOB: 1945-10-09 DOA: 06/03/2021 ? ?PCP: Velna Hatchet, MD  ? ?Patient coming from: Home ? ?Chief Complaint: Abdominal pain ? ?HPI: [name] is a 76 y.o. female with medical history significant of anemia, anxiety, insomnia, tremor, migraine, COPD, irritable bowel syndrome, polycythemia vera, LPR, pulmonary embolus, lupus anticoagulant positive presenting with ongoing abdominal pain. ? ?Patient has had 1 to 2 days of right upper quadrant pain with associated nausea and vomiting.  She was evaluated by her PCP outpatient who sent her for an ultrasound of the right upper quadrant.  She is noted to have evidence of cholelithiasis and acute cholecystitis.  Outpatient provider consulted general surgery who reviewed labs and imaging and recommended patient be admitted to the hospital and evaluated by GI. ? ?She has some additional questions about sterilization of endoscopy scopes for GI tomorrow. ? ?She denies fevers, chills, chest pain, constipation, diarrhea. ? ?ED Course: Vital signs in the ED significant for heart rate in the 90s to 100s.  Lab work-up included CMP with potassium 3.4, glucose 125, calcium 8.8, albumin 3.3, AST 452, ALT 334, alk phos 201, T. bili 6.1.  CBC with hemoglobin of 15.5 and platelets 112.  Lipase normal.  Urinalysis pending.  Ultrasound findings outpatient as above.  Patient received fentanyl, ceftriaxone, Compazine, a liter of fluids and a dose of Benadryl in the ED.  GI was consulted on arrival and plan to do ERCP tomorrow.  General surgery not yet reconsulted. ? ?Review of Systems: As per HPI otherwise all other systems reviewed and are negative. ? ?Past Medical History:  ?Diagnosis Date  ? Allergy   ? Anemia   ? Anxiety   ? Asthma   ? DR. Lamonte Sakai  ? Blood transfusion without reported diagnosis   ? Cataract   ? Cholelithiasis   ? Chronic kidney disease   ? gallstones   ? Compression fracture of body of thoracic vertebra (HCC)    ? Depression   ? Dyspnea   ? Dysrhythmia   ? palpitations  ? GERD (gastroesophageal reflux disease)   ? Heart attack ([place])   ? Hemorrhoids   ? IBS (irritable bowel syndrome)   ? Iliotibial band syndrome   ? left knee  ? Migraine   ? Mitochondrial myopathy   ? Normal coronary arteries   ? by cardiac catheterization 2003  ? Osteoporosis   ? Pericarditis   ? age 66  ? Pneumothorax   ? Polycythemia vera(238.4) 06/17/2012  ? PVC's (premature ventricular contractions)   ? Vitamin D deficiency   ? ? ?Past Surgical History:  ?Procedure Laterality Date  ? APPENDECTOMY    ? CARDIAC CATHETERIZATION    ? CATARACT EXTRACTION W/ INTRAOCULAR LENS  IMPLANT, BILATERAL    ? CESAREAN SECTION    ? x 1  ? COLONOSCOPY    ? DILATATION & CURETTAGE/HYSTEROSCOPY WITH MYOSURE N/A 10/29/2014  ? Procedure: DILATATION & CURETTAGE/HYSTEROSCOPY WITH MYOSURE;  Surgeon: Paula Compton, MD;  Location: [facility];  Service: Gynecology;  Laterality: N/A;  ? IR FLUORO GUIDED NEEDLE PLC ASPIRATION/INJECTION LOC  06/10/2016  ? IR FLUORO GUIDED NEEDLE PLC ASPIRATION/INJECTION LOC  06/10/2016  ? KYPHOPLASTY N/A 05/14/2016  ? Procedure: T 11 KYPHOPLASTY;  Surgeon: Phylliss Bob, MD;  Location: [facility];  Service: Orthopedics;  Laterality: N/A;  T 11 KYPHOPLASTY  ? KYPHOPLASTY N/A 06/18/2016  ? Procedure: THORACIC 12 KYPHOPLASTY;  Surgeon: Phylliss Bob, MD;  Location: [facility];  Service: Orthopedics;  Laterality:  N/A;  THORACIC 12 KYPHOPLASTY; REQUEST 1 HOUR AND FLIP ROOM  ? MOUTH SURGERY    ? NASAL SINUS SURGERY    ? NM MYOCAR PERF WALL MOTION  04/27/2006  ? normal  ? US ECHOCARDIOGRAPHY  12/25/2010  ? normal  ? VARICOSE VEIN SURGERY    ? ? ?Social History ? reports that she has never smoked. She has never used smokeless tobacco. She reports current alcohol use of about 2.0 standard drinks per week. She reports that she does not use drugs. ? ?Allergies  ?Allergen Reactions  ? Dulera [Mometasone Furo-Formoterol Fum] Cough  ? ? ?Family History  ?Problem Relation Age of  Onset  ? Asthma Mother   ? Arthritis Mother   ? Depression Mother   ? Hypertension Mother   ? Tuberculosis Mother   ? Emphysema Father   ? Stroke Father   ? Alcohol abuse Father   ? Heart attack Father   ? Lung cancer Father   ? COPD Brother   ? Colon cancer Neg Hx   ? Esophageal cancer Neg Hx   ? Stomach cancer Neg Hx   ? Pancreatic cancer Neg Hx   ?Reviewed on admission ? ?Prior to Admission medications   ?Medication Sig Start Date End Date Taking? Authorizing Provider  ?albuterol (VENTOLIN HFA) 108 (90 Base) MCG/ACT inhaler Inhale 2 puffs into the lungs every 4 (four) hours as needed for wheezing or shortness of breath. 11/19/20   Collene Gobble, MD  ?dicyclomine (BENTYL) 20 MG tablet Take 1 tablet by mouth daily as needed for spasms. 05/18/18   [provider]  ?folic acid (FOLVITE) 1 MG tablet TAKE 1 TABLET(1 MG) BY MOUTH DAILY 12/19/20   [name], MD  ?LORazepam (ATIVAN) 0.5 MG tablet TAKE 1 TABLET BY MOUTH EVERY NIGHT AT BEDTIME 05/01/14   Leandrew Koyanagi, MD  ?pantoprazole (PROTONIX) 40 MG tablet Take 40 mg by mouth daily. 10/13/16   [provider]  ?propranolol (INDERAL) 20 MG tablet Take 20 mg by mouth daily. 01/04/19   [provider]  ?March Rummage 2.5-2.5 MCG/ACT AERS INHALE 2 PUFFS INTO THE LUNGS DAILY 12/10/20   Collene Gobble, MD  ?Vitamin D, Ergocalciferol, (DRISDOL) 50000 units CAPS capsule Take 1 capsule (50,000 Units total) by mouth every 7 (seven) days. Sundays 07/08/16   [name], MD  ?Alveda Reasons 20 MG TABS tablet TAKE 1 TABLET(20 MG) BY MOUTH DAILY WITH SUPPER 05/16/21   [name], MD  ? ? ?Physical Exam: ?Vitals:  ? 06/03/21 1700 06/03/21 1745 06/03/21 1750 06/03/21 1845  ?BP: 137/70  140/68 125/66  ?Pulse: (!) 105 (!) 101 98 (!) 101  ?Resp:  (!) 24 18 15   ?Temp:      ?TempSrc:      ?SpO2: 91% 99% 99% 95%  ? ? ?Physical Exam ?Constitutional:   ?   General: She is not in acute distress. ?   Appearance: Normal appearance.  ?HENT:  ?   Head:  Normocephalic and atraumatic.  ?   Mouth/Throat:  ?   Mouth: Mucous membranes are moist.  ?   Pharynx: Oropharynx is clear.  ?Eyes:  ?   Extraocular Movements: Extraocular movements intact.  ?   Pupils: Pupils are equal, round, and reactive to light.  ?Cardiovascular:  ?   Rate and Rhythm: Normal rate and regular rhythm.  ?   Pulses: Normal pulses.  ?   Heart sounds: Normal heart sounds.  ?Pulmonary:  ?   Effort:  Pulmonary effort is normal. No respiratory distress.  ?   Breath sounds: Normal breath sounds.  ?Abdominal:  ?   General: Bowel sounds are normal. There is no distension.  ?   Palpations: Abdomen is soft.  ?   Tenderness: There is abdominal tenderness.  ?Musculoskeletal:     ?   General: No swelling or deformity.  ?Skin: ?   General: Skin is warm and dry.  ?Neurological:  ?   General: No focal deficit present.  ?   Mental Status: Mental status is at baseline.  ? ?Labs on Admission: I have personally reviewed following labs and imaging studies ? ?CBC: ?Recent Labs  ?Lab 06/03/21 ?1712  ?WBC 9.3  ?NEUTROABS 7.9*  ?HGB 15.5*  ?HCT 43.8  ?MCV 93.6  ?PLT 112*  ? ? ?Basic Metabolic Panel: ?Recent Labs  ?Lab 06/03/21 ?1712  ?NA 135  ?K 3.4*  ?CL 99  ?CO2 26  ?GLUCOSE 125*  ?BUN 8  ?CREATININE 0.54  ?CALCIUM 8.8*  ? ? ?GFR: ?CrCl cannot be calculated (Unknown ideal weight.). ? ?Liver Function Tests: ?Recent Labs  ?Lab 06/03/21 ?1712  ?AST 492*  ?ALT 334*  ?ALKPHOS 201*  ?BILITOT 6.1*  ?PROT 6.8  ?ALBUMIN 3.3*  ? ? ?Urine analysis: ?   ?Component Value Date/Time  ? COLORURINE YELLOW 04/16/2020 1203  ? APPEARANCEUR CLEAR 04/16/2020 1203  ? LABSPEC 1.010 04/16/2020 1203  ? PHURINE 6.0 04/16/2020 1203  ? GLUCOSEU NEGATIVE 04/16/2020 1203  ? HGBUR NEGATIVE 04/16/2020 1203  ? [place] NEGATIVE 04/16/2020 1203  ? [place] NEGATIVE 04/16/2020 1203  ? PROTEINUR NEGATIVE 04/16/2020 1203  ? UROBILINOGEN 0.2 02/12/2014 0350  ? NITRITE NEGATIVE 04/16/2020 1203  ? LEUKOCYTESUR NEGATIVE 04/16/2020 1203  ? ? ?Radiological  Exams on Admission: ?US Abdomen Limited RUQ (LIVER/GB) ? ?Result Date: 06/03/2021 ?CLINICAL DATA:  Right upper quadrant abdominal pain for 1 day. EXAM: ULTRASOUND ABDOMEN LIMITED RIGHT UPPER QUADRANT COMPARISON:

## 2021-06-03 NOTE — ED Provider Notes (Signed)
?[facility] DEPT ?Provider Note ? ? ?CSN: [id number] ?Arrival date & time: 06/03/21  1618 ? ?  ? ?History ? ?Chief Complaint  ?Patient presents with  ? Abdominal Pain  ? ? ?[name] is a 76 y.o. female who presents emergency department complaining of right upper quadrant pain and nausea and vomiting starting last night.  She states she has had "problems with her gallbladder in the past".  And I contacted her primary doctor at [place] who had sent her for an ultrasound and basic lab work. The ultrasound today had findings consistent with cholelithiasis and acute cholecystitis.  Based on the laboratory evaluation that was done at that time the liver function looked more like an obstructive pattern, with a AST 631, ALT 350, elevated total bilirubin of 6.3.  Dr. Marlou Starks with [facility] was made aware, and requested a GI consultation for the patient. ? ? ?Abdominal Pain ?Associated symptoms: nausea and vomiting   ?Associated symptoms: no chest pain, no diarrhea, no dysuria, no fever, no hematuria and no shortness of breath   ? ?  ? ?Home Medications ?Prior to Admission medications   ?Medication Sig Start Date End Date Taking? Authorizing Provider  ?albuterol (VENTOLIN HFA) 108 (90 Base) MCG/ACT inhaler Inhale 2 puffs into the lungs every 4 (four) hours as needed for wheezing or shortness of breath. 11/19/20   Collene Gobble, MD  ?dicyclomine (BENTYL) 20 MG tablet Take 1 tablet by mouth daily as needed for spasms. 05/18/18   [provider]  ?folic acid (FOLVITE) 1 MG tablet TAKE 1 TABLET(1 MG) BY MOUTH DAILY 12/19/20   [name], MD  ?LORazepam (ATIVAN) 0.5 MG tablet TAKE 1 TABLET BY MOUTH EVERY NIGHT AT BEDTIME 05/01/14   Leandrew Koyanagi, MD  ?pantoprazole (PROTONIX) 40 MG tablet Take 40 mg by mouth daily. 10/13/16   [provider]  ?propranolol (INDERAL) 20 MG tablet Take 20 mg by mouth daily. 01/04/19   [provider]  ?March Rummage 2.5-2.5 MCG/ACT AERS INHALE 2 PUFFS INTO THE LUNGS DAILY 12/10/20   Collene Gobble, MD  ?Vitamin D, Ergocalciferol, (DRISDOL) 50000 units CAPS capsule Take 1 capsule (50,000 Units total) by mouth every 7 (seven) days. Sundays 07/08/16   [name], MD  ?Alveda Reasons 20 MG TABS tablet TAKE 1 TABLET(20 MG) BY MOUTH DAILY WITH SUPPER ?Patient taking differently: Take 20 mg by mouth daily with supper. 05/16/21   [name], MD  ?   ? ?Allergies    ?Dulera [mometasone furo-formoterol fum]   ? ?Review of Systems   ?Review of Systems  ?Constitutional:  Negative for fever.  ?Respiratory:  Negative for shortness of breath.   ?Cardiovascular:  Negative for chest pain.  ?Gastrointestinal:  Positive for abdominal pain, nausea and vomiting. Negative for diarrhea.  ?Genitourinary:  Negative for dysuria, flank pain and hematuria.  ?All other systems reviewed and are negative. ? ?Physical Exam ?Updated Vital Signs ?BP 125/66   Pulse (!) 101   Temp 98.3 ?F (36.8 ?C) (Oral)   Resp 15   SpO2 95%  ?Physical Exam ?Vitals and nursing note reviewed.  ?Constitutional:   ?   Appearance: Normal appearance.  ?HENT:  ?   Head: Normocephalic and atraumatic.  ?Eyes:  ?   Conjunctiva/sclera: Conjunctivae normal.  ?Cardiovascular:  ?   Rate and Rhythm: Normal rate and regular rhythm.  ?Pulmonary:  ?   Effort: Pulmonary effort is normal. No respiratory distress.  ?  Breath sounds: Normal breath sounds.  ?Abdominal:  ?   General: There is no distension.  ?   Palpations: Abdomen is soft.  ?   Tenderness: There is abdominal tenderness in the right upper quadrant. There is no guarding.  ?Skin: ?   General: Skin is warm and dry.  ?Neurological:  ?   General: No focal deficit present.  ?   Mental Status: She is alert.  ? ? ?ED Results / Procedures / Treatments   ?Labs ?(all labs ordered are listed, but only abnormal results are displayed) ?Labs Reviewed  ?COMPREHENSIVE METABOLIC PANEL - Abnormal; Notable for the following components:   ?    Result Value  ? Potassium 3.4 (*)   ? Glucose, Bld 125 (*)   ? Calcium 8.8 (*)   ? Albumin 3.3 (*)   ? AST 492 (*)   ? ALT 334 (*)   ? Alkaline Phosphatase 201 (*)   ? Total Bilirubin 6.1 (*)   ? All other components within normal limits  ?CBC WITH DIFFERENTIAL/PLATELET - Abnormal; Notable for the following components:  ? Hemoglobin 15.5 (*)   ? Platelets 112 (*)   ? Neutro Abs 7.9 (*)   ? Lymphs Abs 0.5 (*)   ? All other components within normal limits  ?LIPASE, BLOOD  ?URINALYSIS, ROUTINE W REFLEX MICROSCOPIC  ?COMPREHENSIVE METABOLIC PANEL  ?CBC  ?MAGNESIUM  ? ? ?EKG ?None ? ?Radiology ?US Abdomen Limited RUQ (LIVER/GB) ? ?Result Date: 06/03/2021 ?CLINICAL DATA:  Right upper quadrant abdominal pain for 1 day. EXAM: ULTRASOUND ABDOMEN LIMITED RIGHT UPPER QUADRANT COMPARISON:  Right upper quadrant ultrasound 04/30/2020 FINDINGS: Gallbladder: Multiple mobile stones are present measuring up to 11 mm. The gallbladder wall is thickened at 3.4 mm. Sonographic Percell Miller sign is reported. Common bile duct: Diameter: 2.6 mm, within normal limits Liver: The liver is mildly echogenic. No discrete lesions are present. Portal vein is patent on color Doppler imaging with normal direction of blood flow towards the liver. Other: None. IMPRESSION: 1. Cholelithiasis with gallbladder wall thickening and a positive sonographic Murphy sign. Findings consistent with acute cholecystitis. 2. Liver is mildly echogenic.  Question hepatic steatosis. Electronically Signed   By: San Morelle M.D.   On: 06/03/2021 14:21   ? ?Procedures ?Marland KitchenCritical Care ?Performed by: Kateri Plummer, PA-C ?Authorized by: Kateri Plummer, PA-C  ? ?Critical care provider statement:  ?  Critical care time (minutes):  30 ?  Critical care time was exclusive of:  Separately billable procedures and treating other patients ?  Critical care was necessary to treat or prevent imminent or life-threatening deterioration of the following conditions:  Sepsis ?   Critical care was time spent personally by me on the following activities:  Development of treatment plan with patient or surrogate, discussions with consultants, evaluation of patient's response to treatment, examination of patient, ordering and review of laboratory studies, ordering and review of radiographic studies, ordering and performing treatments and interventions, pulse oximetry, re-evaluation of patient's condition and review of old charts  ? ? ?Medications Ordered in ED ?Medications  ?cefTRIAXone (ROCEPHIN) 2 g in sodium chloride 0.9 % 100 mL IVPB (has no administration in time range)  ?propranolol (INDERAL) tablet 20 mg (has no administration in time range)  ?LORazepam (ATIVAN) tablet 0.5 mg (has no administration in time range)  ?dicyclomine (BENTYL) tablet 20 mg (has no administration in time range)  ?pantoprazole (PROTONIX) EC tablet 40 mg (has no administration in time range)  ?albuterol (VENTOLIN HFA) 108 (90  Base) MCG/ACT inhaler 2 puff (has no administration in time range)  ?arformoterol (BROVANA) nebulizer solution 15 mcg (has no administration in time range)  ?  And  ?umeclidinium bromide (INCRUSE ELLIPTA) 62.5 MCG/ACT 1 puff (has no administration in time range)  ?sodium chloride flush (NS) 0.9 % injection 3 mL (has no administration in time range)  ?0.9 %  sodium chloride infusion (has no administration in time range)  ?acetaminophen (TYLENOL) tablet 650 mg (has no administration in time range)  ?  Or  ?acetaminophen (TYLENOL) suppository 650 mg (has no administration in time range)  ?HYDROmorphone (DILAUDID) injection 0.5-1 mg (has no administration in time range)  ?polyethylene glycol (MIRALAX / GLYCOLAX) packet 17 g (has no administration in time range)  ?ondansetron (ZOFRAN) injection 4 mg (has no administration in time range)  ?potassium chloride (KLOR-CON) packet 20 mEq (has no administration in time range)  ?sodium chloride 0.9 % bolus 1,000 mL (1,000 mLs Intravenous New Bag/Given  06/03/21 1839)  ?fentaNYL (SUBLIMAZE) injection 50 mcg (50 mcg Intravenous Given 06/03/21 1840)  ?prochlorperazine (COMPAZINE) injection 10 mg (10 mg Intravenous Given 06/03/21 1840)  ?diphenhydrAMINE (BENADRYL) injecti

## 2021-06-03 NOTE — ED Triage Notes (Signed)
Today's Labs: ?ALT 350 ?AST 631 ?Total Bil 6.3 ?WBC 8 ?Dr Marlou Starks aware and request GI Consult. ?

## 2021-06-03 NOTE — ED Notes (Signed)
Pt states she hasn't had anything to drink all day and is unable to give urine sample at this time. ?

## 2021-06-03 NOTE — ED Triage Notes (Signed)
Pt reports RUQ pain and N/V since last night. Pt was sent here for GU consult due to gallbladder concerns.  ?

## 2021-06-04 ENCOUNTER — Observation Stay (HOSPITAL_COMMUNITY): Payer: Medicare Other

## 2021-06-04 DIAGNOSIS — D6862 Lupus anticoagulant syndrome: Secondary | ICD-10-CM | POA: Diagnosis present

## 2021-06-04 DIAGNOSIS — Z7901 Long term (current) use of anticoagulants: Secondary | ICD-10-CM | POA: Diagnosis not present

## 2021-06-04 DIAGNOSIS — I10 Essential (primary) hypertension: Secondary | ICD-10-CM | POA: Diagnosis present

## 2021-06-04 DIAGNOSIS — E871 Hypo-osmolality and hyponatremia: Secondary | ICD-10-CM | POA: Diagnosis not present

## 2021-06-04 DIAGNOSIS — R251 Tremor, unspecified: Secondary | ICD-10-CM | POA: Diagnosis present

## 2021-06-04 DIAGNOSIS — Z86711 Personal history of pulmonary embolism: Secondary | ICD-10-CM | POA: Diagnosis not present

## 2021-06-04 DIAGNOSIS — M810 Age-related osteoporosis without current pathological fracture: Secondary | ICD-10-CM | POA: Diagnosis present

## 2021-06-04 DIAGNOSIS — K7689 Other specified diseases of liver: Secondary | ICD-10-CM | POA: Diagnosis not present

## 2021-06-04 DIAGNOSIS — K8010 Calculus of gallbladder with chronic cholecystitis without obstruction: Secondary | ICD-10-CM | POA: Diagnosis not present

## 2021-06-04 DIAGNOSIS — F418 Other specified anxiety disorders: Secondary | ICD-10-CM | POA: Diagnosis not present

## 2021-06-04 DIAGNOSIS — G4700 Insomnia, unspecified: Secondary | ICD-10-CM | POA: Diagnosis present

## 2021-06-04 DIAGNOSIS — K8020 Calculus of gallbladder without cholecystitis without obstruction: Secondary | ICD-10-CM | POA: Diagnosis not present

## 2021-06-04 DIAGNOSIS — K810 Acute cholecystitis: Secondary | ICD-10-CM | POA: Diagnosis present

## 2021-06-04 DIAGNOSIS — Z961 Presence of intraocular lens: Secondary | ICD-10-CM | POA: Diagnosis present

## 2021-06-04 DIAGNOSIS — K219 Gastro-esophageal reflux disease without esophagitis: Secondary | ICD-10-CM | POA: Diagnosis present

## 2021-06-04 DIAGNOSIS — K8000 Calculus of gallbladder with acute cholecystitis without obstruction: Secondary | ICD-10-CM | POA: Diagnosis present

## 2021-06-04 DIAGNOSIS — Z825 Family history of asthma and other chronic lower respiratory diseases: Secondary | ICD-10-CM | POA: Diagnosis not present

## 2021-06-04 DIAGNOSIS — K8012 Calculus of gallbladder with acute and chronic cholecystitis without obstruction: Secondary | ICD-10-CM | POA: Diagnosis not present

## 2021-06-04 DIAGNOSIS — K760 Fatty (change of) liver, not elsewhere classified: Secondary | ICD-10-CM | POA: Diagnosis present

## 2021-06-04 DIAGNOSIS — K589 Irritable bowel syndrome without diarrhea: Secondary | ICD-10-CM | POA: Diagnosis present

## 2021-06-04 DIAGNOSIS — K7460 Unspecified cirrhosis of liver: Secondary | ICD-10-CM | POA: Diagnosis present

## 2021-06-04 DIAGNOSIS — Z888 Allergy status to other drugs, medicaments and biological substances status: Secondary | ICD-10-CM | POA: Diagnosis not present

## 2021-06-04 DIAGNOSIS — Z66 Do not resuscitate: Secondary | ICD-10-CM | POA: Diagnosis present

## 2021-06-04 DIAGNOSIS — G43909 Migraine, unspecified, not intractable, without status migrainosus: Secondary | ICD-10-CM | POA: Diagnosis present

## 2021-06-04 DIAGNOSIS — K828 Other specified diseases of gallbladder: Secondary | ICD-10-CM | POA: Diagnosis not present

## 2021-06-04 DIAGNOSIS — Z79899 Other long term (current) drug therapy: Secondary | ICD-10-CM | POA: Diagnosis not present

## 2021-06-04 DIAGNOSIS — D45 Polycythemia vera: Secondary | ICD-10-CM | POA: Diagnosis present

## 2021-06-04 DIAGNOSIS — D510 Vitamin B12 deficiency anemia due to intrinsic factor deficiency: Secondary | ICD-10-CM | POA: Diagnosis present

## 2021-06-04 DIAGNOSIS — I252 Old myocardial infarction: Secondary | ICD-10-CM | POA: Diagnosis not present

## 2021-06-04 DIAGNOSIS — R17 Unspecified jaundice: Secondary | ICD-10-CM | POA: Diagnosis not present

## 2021-06-04 DIAGNOSIS — J449 Chronic obstructive pulmonary disease, unspecified: Secondary | ICD-10-CM | POA: Diagnosis present

## 2021-06-04 DIAGNOSIS — R945 Abnormal results of liver function studies: Secondary | ICD-10-CM | POA: Diagnosis not present

## 2021-06-04 DIAGNOSIS — F411 Generalized anxiety disorder: Secondary | ICD-10-CM | POA: Diagnosis present

## 2021-06-04 LAB — COMPREHENSIVE METABOLIC PANEL
ALT: 264 U/L — ABNORMAL HIGH (ref 0–44)
AST: 301 U/L — ABNORMAL HIGH (ref 15–41)
Albumin: 2.8 g/dL — ABNORMAL LOW (ref 3.5–5.0)
Alkaline Phosphatase: 170 U/L — ABNORMAL HIGH (ref 38–126)
Anion gap: 6 (ref 5–15)
BUN: 7 mg/dL — ABNORMAL LOW (ref 8–23)
CO2: 26 mmol/L (ref 22–32)
Calcium: 7.8 mg/dL — ABNORMAL LOW (ref 8.9–10.3)
Chloride: 103 mmol/L (ref 98–111)
Creatinine, Ser: 0.48 mg/dL (ref 0.44–1.00)
GFR, Estimated: >60 mL/min (ref >60)
Glucose, Bld: 108 mg/dL — ABNORMAL HIGH (ref 70–99)
Potassium: 3.7 mmol/L (ref 3.5–5.1)
Sodium: 135 mmol/L (ref 135–145)
Total Bilirubin: 7.3 mg/dL — ABNORMAL HIGH (ref 0.3–1.2)
Total Protein: 5.9 g/dL — ABNORMAL LOW (ref 6.5–8.1)

## 2021-06-04 LAB — CBC
HCT: 38.5 % (ref 36.0–46.0)
Hemoglobin: 13.2 g/dL (ref 12.0–15.0)
MCH: 32.7 pg (ref 26.0–34.0)
MCHC: 34.3 g/dL (ref 30.0–36.0)
MCV: 95.3 fL (ref 80.0–100.0)
Platelets: 90 10*3/uL — ABNORMAL LOW (ref 150–400)
RBC: 4.04 MIL/uL (ref 3.87–5.11)
RDW: 14.4 % (ref 11.5–15.5)
WBC: 7.6 10*3/uL (ref 4.0–10.5)
nRBC: 0 % (ref 0.0–0.2)

## 2021-06-04 LAB — PROTIME-INR
INR: 1.5 — ABNORMAL HIGH (ref 0.8–1.2)
Prothrombin Time: 17.6 seconds — ABNORMAL HIGH (ref 11.4–15.2)

## 2021-06-04 MED ORDER — LIP MEDEX EX OINT
TOPICAL_OINTMENT | CUTANEOUS | Status: DC | PRN
  Filled 2021-06-04: qty 7

## 2021-06-04 MED ORDER — GADOBUTROL 1 MMOL/ML IV SOLN
7.0000 mL | Freq: Once | INTRAVENOUS | Status: AC | PRN
  Administered 2021-06-04: 7 mL via INTRAVENOUS

## 2021-06-04 MED ORDER — SODIUM CHLORIDE 0.9 % IV SOLN: INTRAVENOUS | Status: DC

## 2021-06-04 NOTE — Progress Notes (Signed)
[facility] ? ?PROGRESS NOTE ? ?[name] GDJ:[id number] DOB: [birth year] DOA: 06/03/2021 ?PCP: Velna Hatchet, MD ? ? ?Brief HPI:   ?76 year old female with medical history of anemia, anxiety, insomnia, tremor, migraine, COPD, IBS, polycythemia vera, pulmonary embolism, lupus anticoagulant presented with abdominal pain.  Abdominal ultrasound showed cholelithiasis with gallbladder wall thickening and positive sonographic Murphy sign.  Also shows hepatic steatosis. ?GI was consulted, MRCP was ordered.  MRCP showed cholelithiasis with gallbladder wall thickening and trace pericholecystic fluid.  Findings suspicious for acute cholecystitis.  Somewhat coarse nodular contour of liver with heterogeneous parenchymal contrast-enhancement suggesting cirrhosis..  No bili ductal dilatation or choledocholithiasis. ? ? ? ?Subjective  ? ?Patient seen and examined, denies nausea or vomiting.  Pain well controlled. ? ? Assessment/Plan:  ? ? ?Acute cholecystitis ?-Seen on abdominal ultrasound ?-MRCP negative for choledocholithiasis ?-Xarelto on hold ?-LFTs are elevated ?-General surgery consulted ?-If LFTs normal, likely surgery in a.m. ?-Continue ceftriaxone ? ?Hypokalemia ?-Replete ? ?History of pulmonary embolism ?-Xarelto on hold in anticipation for surgery in a.m. ? ?Anxiety/insomnia ?-Continue sertraline ?-Continue as needed Ativan ? ?COPD ?-Continue Brovana twice daily ?-Continue umeclidinium 1 puff daily ? ?Tremors ?-Continue propranolol ? ? ?Medications ? ?  ? arformoterol  15 mcg Nebulization BID  ? And  ? umeclidinium bromide  1 puff Inhalation Daily  ? pantoprazole  40 mg Oral Daily  ? propranolol  20 mg Oral Daily  ? sodium chloride flush  3 mL Intravenous Q12H  ? ? ? Data Reviewed:  ? ?CBG: ? ?No results for input(s): GLUCAP in the last 168 hours. ? ?SpO2: 94 %  ? ? ?Vitals:  ? 06/04/21 0608 06/04/21 0857 06/04/21 0959 06/04/21 1308  ?BP: 115/64  124/61 106/63  ?Pulse: (!) 102  92 80  ?Resp: 15   20   ?Temp: 98.2 ?F (36.8 ?C)   (!) 97.4 ?F (36.3 ?C)  ?TempSrc: Oral     ?SpO2: 91% 92% 92% 94%  ? ? ? ? ?Data Reviewed: ? ?Basic Metabolic Panel: ?Recent Labs  ?Lab 06/03/21 ?1712 06/04/21 ?6222  ?NA 135 135  ?K 3.4* 3.7  ?CL 99 103  ?CO2 26 26  ?GLUCOSE 125* 108*  ?BUN 8 7*  ?CREATININE 0.54 0.48  ?CALCIUM 8.8* 7.8*  ?MG 1.7  --   ? ? ?CBC: ?Recent Labs  ?Lab 06/03/21 ?1712 06/04/21 ?9798  ?WBC 9.3 7.6  ?NEUTROABS 7.9*  --   ?HGB 15.5* 13.2  ?HCT 43.8 38.5  ?MCV 93.6 95.3  ?PLT 112* 90*  ? ? ?LFT ?Recent Labs  ?Lab 06/03/21 ?1712 06/04/21 ?9211  ?AST 492* 301*  ?ALT 334* 264*  ?ALKPHOS 201* 170*  ?BILITOT 6.1* 7.3*  ?PROT 6.8 5.9*  ?ALBUMIN 3.3* 2.8*  ? ?  ?Antibiotics: ?Anti-infectives (From admission, onward)  ? ? Start     Dose/Rate Route Frequency Ordered Stop  ? 06/04/21 1800  cefTRIAXone (ROCEPHIN) 2 g in sodium chloride 0.9 % 100 mL IVPB       ? 2 g ?200 mL/hr over 30 Minutes Intravenous Every 24 hours 06/03/21 1916    ? 06/03/21 1830  cefTRIAXone (ROCEPHIN) 2 g in sodium chloride 0.9 % 100 mL IVPB       ? 2 g ?200 mL/hr over 30 Minutes Intravenous  Once 06/03/21 1823 06/03/21 1906  ? ?  ? ? ? ?DVT prophylaxis: SCDs ? ?Code Status: Full code ? ?Family Communication: No family at bedside ? ? ?CONSULTS gastroenterology ? ? ?Objective  ? ? ?Physical  Examination: ? ? ?General-appears in no acute distress ?Heart-S1-S2, regular, no murmur auscultated ?Lungs-clear to auscultation bilaterally, no wheezing or crackles auscultated ?Abdomen-soft, mild tenderness in right upper quadrant ?Extremities-no edema in the lower extremities ?Neuro-alert, oriented x3, no focal deficit noted ? ? ?Status is: Inpatient: Cholecystitis ? ? ? ?  ? ? ?Oswald Hillock ?  ?[facility] ?If 7PM-7AM, please contact night-coverage at [email or web address], ?Office  [phone] ? ? ?06/04/2021, 6:38 PM  LOS: 0 days  ? ? ? ? ? ? ? ? ? ? ?  ?

## 2021-06-04 NOTE — Progress Notes (Addendum)
Patient ID: [name], female   DOB: [birth year], 76 y.o.   MRN: [id number] ? ? ?Consultation ? ?Referring Provider:  [facility] [place] ?Primary Care Physician:  Velna Hatchet, MD ?Primary Gastroenterologist:  Dr. Havery Moros ? ?Reason for Consultation: Right upper quadrant pain, elevated LFTs, cholecystitis ? ?HPI: [name] is a 76 y.o. female, established with Dr. Havery Moros who has been seen in our office only for colonoscopy which was done in August 2019 for follow-up of adenomatous polyps.  She had 2 polyps removed, the largest was 12 mm and was indicated for 3-year interval follow-up. ? ?Patient has known cholelithiasis, and says that she has had intermittent symptoms over the past several years.  She was admitted through the emergency room last night after acute onset of severe right upper quadrant pain on Monday evening which lasted for multiple hours and was associated with several episodes of nausea and vomiting.  She has also developed intermittent chills/rigors since that time.  The pain dissipated at some point yesterday. ?She had abdominal ultrasound done as an outpatient yesterday per her PCP with finding of multiple gallstones up to 11 mm, thickened gallbladder wall to 3.4 mm and CBD of 2.6 mm.  Findings consistent with acute cholecystitis. ?Labs yesterday showed T. bili 6.1/alk phos 201/AST 492/ALT 334 ?WBC 9.3/hemoglobin 15/hematocrit 43 ? ?Today T. bili 7.3/alk phos 170/AST 34/ALT 264. ?WBC pending ? ?Patient says she has noticed her urine has been dark in the past couple of days.  She endorses having shaking chills when she arrived to the room last night but has not had any since. ?No current pain. ? 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  Last dose was Monday, 06/02/2021 ? ?She has history of polycythemia vera, and hemochromatosis She is followed by Dr. Marin Olp and says that she has phlebotomies done every couple of months, also with COPD, no oxygen  use, IBS, anxiety, lupus anticoagulant positive. ? ?Surgical consult is pending this morning, and MRCP has been ordered. ? ? ?Past Medical History:  ?Diagnosis Date  ? Allergy   ? Anemia   ? Anxiety   ? Asthma   ? DR. Lamonte Sakai  ? Blood transfusion without reported diagnosis   ? Cataract   ? Cholelithiasis   ? Chronic kidney disease   ? gallstones   ? Compression fracture of body of thoracic vertebra (HCC)   ? Depression   ? Dyspnea   ? Dysrhythmia   ? palpitations  ? GERD (gastroesophageal reflux disease)   ? Heart attack ([place])   ? Hemorrhoids   ? IBS (irritable bowel syndrome)   ? Iliotibial band syndrome   ? left knee  ? Migraine   ? Mitochondrial myopathy   ? Normal coronary arteries   ? by cardiac catheterization 2003  ? Osteoporosis   ? Pericarditis   ? age 55  ? Pneumothorax   ? Polycythemia vera(238.4) 06/17/2012  ? PVC's (premature ventricular contractions)   ? Vitamin D deficiency   ? ? ?Past Surgical History:  ?Procedure Laterality Date  ? APPENDECTOMY    ? CARDIAC CATHETERIZATION    ? CATARACT EXTRACTION W/ INTRAOCULAR LENS  IMPLANT, BILATERAL    ? CESAREAN SECTION    ? x 1  ? COLONOSCOPY    ? DILATATION & CURETTAGE/HYSTEROSCOPY WITH MYOSURE N/A 10/29/2014  ? Procedure: DILATATION & CURETTAGE/HYSTEROSCOPY WITH MYOSURE;  Surgeon: Paula Compton, MD;  Location: [facility];  Service: Gynecology;  Laterality: N/A;  ?  IR FLUORO GUIDED NEEDLE PLC ASPIRATION/INJECTION LOC  06/10/2016  ? IR FLUORO GUIDED NEEDLE PLC ASPIRATION/INJECTION LOC  06/10/2016  ? KYPHOPLASTY N/A 05/14/2016  ? Procedure: T 11 KYPHOPLASTY;  Surgeon: Phylliss Bob, MD;  Location: [facility];  Service: Orthopedics;  Laterality: N/A;  T 11 KYPHOPLASTY  ? KYPHOPLASTY N/A 06/18/2016  ? Procedure: THORACIC 12 KYPHOPLASTY;  Surgeon: Phylliss Bob, MD;  Location: [facility];  Service: Orthopedics;  Laterality: N/A;  THORACIC 12 KYPHOPLASTY; REQUEST 1 HOUR AND FLIP ROOM  ? MOUTH SURGERY    ? NASAL SINUS SURGERY    ? NM MYOCAR PERF WALL MOTION  04/27/2006  ? normal  ? US  ECHOCARDIOGRAPHY  12/25/2010  ? normal  ? VARICOSE VEIN SURGERY    ? ? ?Prior to Admission medications   ?Medication Sig Start Date End Date Taking? Authorizing Provider  ?albuterol (VENTOLIN HFA) 108 (90 Base) MCG/ACT inhaler Inhale 2 puffs into the lungs every 4 (four) hours as needed for wheezing or shortness of breath. 11/19/20  Yes Collene Gobble, MD  ?folic acid (FOLVITE) 1 MG tablet TAKE 1 TABLET(1 MG) BY MOUTH DAILY ?Patient taking differently: Take 1 mg by mouth daily. 12/19/20  Yes Ennever, Rudell Cobb, MD  ?LORazepam (ATIVAN) 0.5 MG tablet TAKE 1 TABLET BY MOUTH EVERY NIGHT AT BEDTIME ?Patient taking differently: Take 0.5 mg by mouth at bedtime. 05/01/14  Yes Leandrew Koyanagi, MD  ?ondansetron (ZOFRAN-ODT) 4 MG disintegrating tablet Take 4 mg by mouth every 6 (six) hours as needed for nausea or vomiting. 06/03/21  Yes [provider]  ?pantoprazole (PROTONIX) 40 MG tablet Take 40 mg by mouth daily. 10/13/16  Yes [provider]  ?propranolol (INDERAL) 20 MG tablet Take 20 mg by mouth daily. 01/04/19  Yes [provider]  ?March Rummage 2.5-2.5 MCG/ACT AERS INHALE 2 PUFFS INTO THE LUNGS DAILY 12/10/20  Yes Collene Gobble, MD  ?Vitamin D, Ergocalciferol, (DRISDOL) 50000 units CAPS capsule Take 1 capsule (50,000 Units total) by mouth every 7 (seven) days. Sundays 07/08/16  Yes Ennever, Rudell Cobb, MD  ?Alveda Reasons 20 MG TABS tablet TAKE 1 TABLET(20 MG) BY MOUTH DAILY WITH SUPPER ?Patient taking differently: Take 20 mg by mouth daily with supper. 05/16/21  Yes [name], MD  ? ? ?Current Facility-Administered Medications  ?Medication Dose Route Frequency Provider Last Rate Last Admin  ? acetaminophen (TYLENOL) tablet 650 mg  650 mg Oral Q6H PRN Marcelyn Bruins, MD      ? Or  ? acetaminophen (TYLENOL) suppository 650 mg  650 mg Rectal Q6H PRN Marcelyn Bruins, MD      ? albuterol (PROVENTIL) (2.5 MG/3ML) 0.083% nebulizer solution 2.5 mg  2.5 mg Nebulization Q4H PRN Marcelyn Bruins, MD      ? arformoterol [facility]) nebulizer solution 15 mcg  15 mcg Nebulization BID Marcelyn Bruins, MD      ? And  ? umeclidinium bromide (INCRUSE ELLIPTA) 62.5 MCG/ACT 1 puff  1 puff Inhalation Daily Marcelyn Bruins, MD      ? cefTRIAXone (ROCEPHIN) 2 g in sodium chloride 0.9 % 100 mL IVPB  2 g Intravenous Q24H Marcelyn Bruins, MD      ? HYDROmorphone (DILAUDID) injection 0.5-1 mg  0.5-1 mg Intravenous Q4H PRN Marcelyn Bruins, MD   1 mg at 06/03/21 2131  ? LORazepam (ATIVAN) tablet 0.5 mg  0.5 mg Oral QHS PRN Marcelyn Bruins, MD      ? ondansetron [facility]) injection 4  mg  4 mg Intravenous Q6H PRN Marcelyn Bruins, MD      ? pantoprazole (PROTONIX) EC tablet 40 mg  40 mg Oral Daily Marcelyn Bruins, MD      ? polyethylene glycol (MIRALAX / GLYCOLAX) packet 17 g  17 g Oral Daily PRN Marcelyn Bruins, MD      ? propranolol (INDERAL) tablet 20 mg  20 mg Oral Daily Marcelyn Bruins, MD      ? sodium chloride flush (NS) 0.9 % injection 3 mL  3 mL Intravenous Q12H Marcelyn Bruins, MD      ? ? ?Allergies as of 06/03/2021 - Review Complete 06/03/2021  ?Allergen Reaction Noted  ? Dulera [mometasone furo-formoterol fum] Cough 04/01/2015  ? ? ?Family History  ?Problem Relation Age of Onset  ? Asthma Mother   ? Arthritis Mother   ? Depression Mother   ? Hypertension Mother   ? Tuberculosis Mother   ? Emphysema Father   ? Stroke Father   ? Alcohol abuse Father   ? Heart attack Father   ? Lung cancer Father   ? COPD Brother   ? Colon cancer Neg Hx   ? Esophageal cancer Neg Hx   ? Stomach cancer Neg Hx   ? Pancreatic cancer Neg Hx   ? ? ?Social History  ? ?Socioeconomic History  ? Marital status: Married  ?  Spouse name: [name]  ? Number of children: 2  ? Years of education: Jeanella Anton  ? Highest education level: Not on file  ?Occupational History  ? Occupation: Psychologist, educational  ?  Employer: RETIRED  ?Tobacco Use  ? Smoking status: Never  ? Smokeless tobacco: Never  ? Tobacco  comments:  ?  never used tobacco  ?Vaping Use  ? Vaping Use: Never used  ?Substance and Sexual Activity  ? Alcohol use: Yes  ?  Alcohol/week: 2.0 standard drinks  ?  Types: 2 Glasses of wine per week  ?  Comment: o

## 2021-06-04 NOTE — Consult Note (Signed)
Reason for Consult:abd pain ?Referring Physician: Dr. Darrick Meigs ? ?[name] is an 76 y.o. female.  ?HPI: The patient is a 76 year old white female who has known gallstones for several years.  She states that she has abdominal pain episodes about once every 3 months.  The most recent episode she would describe as severe.  It started on Monday.  It was associated with some nausea and vomiting.  The pain did not improve so on Tuesday she went to her medical doctor's office for an ultrasound.  She was then sent to the emergency department for admission.  Her liver functions were noted to be significantly elevated.  She underwent an MRCP that showed no evidence of common bile duct stone.  She feels much better now.  She has been off her blood thinner since Monday ? ?Past Medical History:  ?Diagnosis Date  ? Allergy   ? Anemia   ? Anxiety   ? Asthma   ? DR. Lamonte Sakai  ? Blood transfusion without reported diagnosis   ? Cataract   ? Cholelithiasis   ? Chronic kidney disease   ? gallstones   ? Compression fracture of body of thoracic vertebra (HCC)   ? Depression   ? Dyspnea   ? Dysrhythmia   ? palpitations  ? GERD (gastroesophageal reflux disease)   ? Heart attack ([place])   ? Hemorrhoids   ? IBS (irritable bowel syndrome)   ? Iliotibial band syndrome   ? left knee  ? Migraine   ? Mitochondrial myopathy   ? Normal coronary arteries   ? by cardiac catheterization 2003  ? Osteoporosis   ? Pericarditis   ? age 31  ? Pneumothorax   ? Polycythemia vera(238.4) 06/17/2012  ? PVC's (premature ventricular contractions)   ? Vitamin D deficiency   ? ? ?Past Surgical History:  ?Procedure Laterality Date  ? APPENDECTOMY    ? CARDIAC CATHETERIZATION    ? CATARACT EXTRACTION W/ INTRAOCULAR LENS  IMPLANT, BILATERAL    ? CESAREAN SECTION    ? x 1  ? COLONOSCOPY    ? DILATATION & CURETTAGE/HYSTEROSCOPY WITH MYOSURE N/A 10/29/2014  ? Procedure: DILATATION & CURETTAGE/HYSTEROSCOPY WITH MYOSURE;  Surgeon: Paula Compton, MD;  Location: [facility];   Service: Gynecology;  Laterality: N/A;  ? IR FLUORO GUIDED NEEDLE PLC ASPIRATION/INJECTION LOC  06/10/2016  ? IR FLUORO GUIDED NEEDLE PLC ASPIRATION/INJECTION LOC  06/10/2016  ? KYPHOPLASTY N/A 05/14/2016  ? Procedure: T 11 KYPHOPLASTY;  Surgeon: Phylliss Bob, MD;  Location: [facility];  Service: Orthopedics;  Laterality: N/A;  T 11 KYPHOPLASTY  ? KYPHOPLASTY N/A 06/18/2016  ? Procedure: THORACIC 12 KYPHOPLASTY;  Surgeon: Phylliss Bob, MD;  Location: [facility];  Service: Orthopedics;  Laterality: N/A;  THORACIC 12 KYPHOPLASTY; REQUEST 1 HOUR AND FLIP ROOM  ? MOUTH SURGERY    ? NASAL SINUS SURGERY    ? NM MYOCAR PERF WALL MOTION  04/27/2006  ? normal  ? US ECHOCARDIOGRAPHY  12/25/2010  ? normal  ? VARICOSE VEIN SURGERY    ? ? ?Family History  ?Problem Relation Age of Onset  ? Asthma Mother   ? Arthritis Mother   ? Depression Mother   ? Hypertension Mother   ? Tuberculosis Mother   ? Emphysema Father   ? Stroke Father   ? Alcohol abuse Father   ? Heart attack Father   ? Lung cancer Father   ? COPD Brother   ? Colon cancer Neg Hx   ? Esophageal cancer Neg  Hx   ? Stomach cancer Neg Hx   ? Pancreatic cancer Neg Hx   ? ? ?Social History:  reports that she has never smoked. She has never used smokeless tobacco. She reports current alcohol use of about 2.0 standard drinks per week. She reports that she does not use drugs. ? ?Allergies:  ?Allergies  ?Allergen Reactions  ? Dulera [Mometasone Furo-Formoterol Fum] Cough  ? ? ?Medications: I have reviewed the patient's current medications. ? ?Results for orders placed or performed during the hospital encounter of 06/03/21 (from the past 48 hour(s))  ?Comprehensive metabolic panel     Status: Abnormal  ? Collection Time: 06/03/21  5:12 PM  ?Result Value Ref Range  ? Sodium 135 135 - 145 mmol/L  ? Potassium 3.4 (L) 3.5 - 5.1 mmol/L  ? Chloride 99 98 - 111 mmol/L  ? CO2 26 22 - 32 mmol/L  ? Glucose, Bld 125 (H) 70 - 99 mg/dL  ?  Comment: Glucose reference range applies only to samples taken  after fasting for at least 8 hours.  ? BUN 8 8 - 23 mg/dL  ? Creatinine, Ser 0.54 0.44 - 1.00 mg/dL  ? Calcium 8.8 (L) 8.9 - 10.3 mg/dL  ? Total Protein 6.8 6.5 - 8.1 g/dL  ? Albumin 3.3 (L) 3.5 - 5.0 g/dL  ? AST 492 (H) 15 - 41 U/L  ? ALT 334 (H) 0 - 44 U/L  ? Alkaline Phosphatase 201 (H) 38 - 126 U/L  ? Total Bilirubin 6.1 (H) 0.3 - 1.2 mg/dL  ? GFR, Estimated >60 >60 mL/min  ?  Comment: (NOTE) ?Calculated using the CKD-EPI Creatinine Equation (2021) ?  ? Anion gap 10 5 - 15  ?  Comment: Performed at [facility], [place] [address] [place], [place] [address]  ?Lipase, blood     Status: None  ? Collection Time: 06/03/21  5:12 PM  ?Result Value Ref Range  ? Lipase 25 11 - 51 U/L  ?  Comment: Performed at [facility], [place] [address] [place], [place] [address]  ?CBC with Diff     Status: Abnormal  ? Collection Time: 06/03/21  5:12 PM  ?Result Value Ref Range  ? WBC 9.3 4.0 - 10.5 K/uL  ? RBC 4.68 3.87 - 5.11 MIL/uL  ? Hemoglobin 15.5 (H) 12.0 - 15.0 g/dL  ? HCT 43.8 36.0 - 46.0 %  ? MCV 93.6 80.0 - 100.0 fL  ? MCH 33.1 26.0 - 34.0 pg  ? MCHC 35.4 30.0 - 36.0 g/dL  ? RDW 14.3 11.5 - 15.5 %  ? Platelets 112 (L) 150 - 400 K/uL  ?  Comment: SPECIMEN CHECKED FOR CLOTS ?Immature Platelet Fraction may be ?clinically indicated, consider ?ordering this additional test ?[id number] ?REPEATED TO VERIFY ?PLATELET COUNT CONFIRMED BY SMEAR ?  ? nRBC 0.0 0.0 - 0.2 %  ? Neutrophils Relative % 86 %  ? Neutro Abs 7.9 (H) 1.7 - 7.7 K/uL  ? Lymphocytes Relative 6 %  ? Lymphs Abs 0.5 (L) 0.7 - 4.0 K/uL  ? Monocytes Relative 8 %  ? Monocytes Absolute 0.8 0.1 - 1.0 K/uL  ? Eosinophils Relative 0 %  ? Eosinophils Absolute 0.0 0.0 - 0.5 K/uL  ? Basophils Relative 0 %  ? Basophils Absolute 0.0 0.0 - 0.1 K/uL  ? Immature Granulocytes 0 %  ? Abs Immature Granulocytes 0.03 0.00 - 0.07 K/uL  ?  Comment: Performed at [facility], [place]  [place], [place] [address]  ?Magnesium      Status: None  ? Collection Time: 06/03/21  5:12 PM  ?Result Value Ref Range  ? Magnesium 1.7 1.7 - 2.4 mg/dL  ?  Comment: Performed at [facility], [place] [address], [place], [place] [address]  ?Urinalysis, Routine w reflex microscopic     Status: Abnormal  ? Collection Time: 06/03/21  9:03 PM  ?Result Value Ref Range  ? Color, Urine AMBER (A) YELLOW  ?  Comment: BIOCHEMICALS MAY BE AFFECTED BY COLOR  ? APPearance CLEAR CLEAR  ? Specific Gravity, Urine 1.009 1.005 - 1.030  ? pH 7.0 5.0 - 8.0  ? Glucose, UA NEGATIVE NEGATIVE mg/dL  ? Hgb urine dipstick SMALL (A) NEGATIVE  ? Bilirubin Urine NEGATIVE NEGATIVE  ? Ketones, ur NEGATIVE NEGATIVE mg/dL  ? Protein, ur NEGATIVE NEGATIVE mg/dL  ? Nitrite NEGATIVE NEGATIVE  ? Leukocytes,Ua NEGATIVE NEGATIVE  ? RBC / HPF 0-5 0 - 5 RBC/hpf  ? WBC, UA 6-10 0 - 5 WBC/hpf  ? Bacteria, UA NONE SEEN NONE SEEN  ? Squamous Epithelial / LPF 0-5 0 - 5  ?  Comment: Performed at [facility], [place] [address], [place], [place] [address]  ?Comprehensive metabolic panel     Status: Abnormal  ? Collection Time: 06/04/21  5:11 AM  ?Result Value Ref Range  ? Sodium 135 135 - 145 mmol/L  ? Potassium 3.7 3.5 - 5.1 mmol/L  ? Chloride 103 98 - 111 mmol/L  ? CO2 26 22 - 32 mmol/L  ? Glucose, Bld 108 (H) 70 - 99 mg/dL  ?  Comment: Glucose reference range applies only to samples taken after fasting for at least 8 hours.  ? BUN 7 (L) 8 - 23 mg/dL  ? Creatinine, Ser 0.48 0.44 - 1.00 mg/dL  ? Calcium 7.8 (L) 8.9 - 10.3 mg/dL  ? Total Protein 5.9 (L) 6.5 - 8.1 g/dL  ? Albumin 2.8 (L) 3.5 - 5.0 g/dL  ? AST 301 (H) 15 - 41 U/L  ? ALT 264 (H) 0 - 44 U/L  ? Alkaline Phosphatase 170 (H) 38 - 126 U/L  ? Total Bilirubin 7.3 (H) 0.3 - 1.2 mg/dL  ? GFR, Estimated >60 >60 mL/min  ?  Comment: (NOTE) ?Calculated using the CKD-EPI Creatinine Equation (2021) ?  ? Anion gap 6 5 - 15  ?  Comment: Performed at [facility], [place] [address], [place], [place] [address]   ?CBC     Status: Abnormal  ? Collection Time: 06/04/21  5:11 AM  ?Result Value Ref Range  ? WBC 7.6 4.0 - 10.5 K/uL  ? RBC 4.04 3.87 - 5.11 MIL/uL  ? Hemoglobin 13.2 12.0 - 15.0 g/dL  ? HCT 38.5 36.0 - 46.0 %  ?

## 2021-06-05 ENCOUNTER — Inpatient Hospital Stay (HOSPITAL_COMMUNITY): Payer: Medicare Other | Admitting: Anesthesiology

## 2021-06-05 ENCOUNTER — Encounter (HOSPITAL_COMMUNITY): Payer: Self-pay | Admitting: Family Medicine

## 2021-06-05 ENCOUNTER — Encounter (HOSPITAL_COMMUNITY)
Admission: EM | Disposition: A | Discharge status: Home / Self Care | Payer: Self-pay | Source: Home / Self Care | Attending: Family Medicine

## 2021-06-05 ENCOUNTER — Inpatient Hospital Stay (HOSPITAL_COMMUNITY): Payer: Medicare Other

## 2021-06-05 ENCOUNTER — Other Ambulatory Visit: Payer: Self-pay

## 2021-06-05 DIAGNOSIS — K8010 Calculus of gallbladder with chronic cholecystitis without obstruction: Secondary | ICD-10-CM

## 2021-06-05 DIAGNOSIS — J449 Chronic obstructive pulmonary disease, unspecified: Secondary | ICD-10-CM

## 2021-06-05 DIAGNOSIS — Z86711 Personal history of pulmonary embolism: Secondary | ICD-10-CM | POA: Diagnosis not present

## 2021-06-05 DIAGNOSIS — F418 Other specified anxiety disorders: Secondary | ICD-10-CM

## 2021-06-05 DIAGNOSIS — K589 Irritable bowel syndrome without diarrhea: Secondary | ICD-10-CM | POA: Diagnosis not present

## 2021-06-05 DIAGNOSIS — K810 Acute cholecystitis: Secondary | ICD-10-CM | POA: Diagnosis not present

## 2021-06-05 DIAGNOSIS — I252 Old myocardial infarction: Secondary | ICD-10-CM

## 2021-06-05 HISTORY — PX: CHOLECYSTECTOMY: SHX55

## 2021-06-05 LAB — COMPREHENSIVE METABOLIC PANEL
ALT: 198 U/L — ABNORMAL HIGH (ref 0–44)
AST: 165 U/L — ABNORMAL HIGH (ref 15–41)
Albumin: 2.7 g/dL — ABNORMAL LOW (ref 3.5–5.0)
Alkaline Phosphatase: 167 U/L — ABNORMAL HIGH (ref 38–126)
Anion gap: 15 (ref 5–15)
BUN: 16 mg/dL (ref 8–23)
CO2: 16 mmol/L — ABNORMAL LOW (ref 22–32)
Calcium: 7.7 mg/dL — ABNORMAL LOW (ref 8.9–10.3)
Chloride: 107 mmol/L (ref 98–111)
Creatinine, Ser: 0.58 mg/dL (ref 0.44–1.00)
GFR, Estimated: >60 mL/min (ref >60)
Glucose, Bld: 55 mg/dL — ABNORMAL LOW (ref 70–99)
Potassium: 3.8 mmol/L (ref 3.5–5.1)
Sodium: 138 mmol/L (ref 135–145)
Total Bilirubin: 5.8 mg/dL — ABNORMAL HIGH (ref 0.3–1.2)
Total Protein: 6.1 g/dL — ABNORMAL LOW (ref 6.5–8.1)

## 2021-06-05 LAB — GLUCOSE, CAPILLARY
Glucose-Capillary: 144 mg/dL — ABNORMAL HIGH (ref 70–99)
Glucose-Capillary: 56 mg/dL — ABNORMAL LOW (ref 70–99)
Glucose-Capillary: 85 mg/dL (ref 70–99)

## 2021-06-05 LAB — CBC WITH DIFFERENTIAL/PLATELET
Abs Immature Granulocytes: 0.08 10*3/uL — ABNORMAL HIGH (ref 0.00–0.07)
Basophils Absolute: 0 10*3/uL (ref 0.0–0.1)
Basophils Relative: 0 %
Eosinophils Absolute: 0 10*3/uL (ref 0.0–0.5)
Eosinophils Relative: 0 %
HCT: 44.8 % (ref 36.0–46.0)
Hemoglobin: 14.5 g/dL (ref 12.0–15.0)
Immature Granulocytes: 1 %
Lymphocytes Relative: 7 %
Lymphs Abs: 0.4 10*3/uL — ABNORMAL LOW (ref 0.7–4.0)
MCH: 32.7 pg (ref 26.0–34.0)
MCHC: 32.4 g/dL (ref 30.0–36.0)
MCV: 101.1 fL — ABNORMAL HIGH (ref 80.0–100.0)
Monocytes Absolute: 0.3 10*3/uL (ref 0.1–1.0)
Monocytes Relative: 6 %
Neutro Abs: 4.8 10*3/uL (ref 1.7–7.7)
Neutrophils Relative %: 86 %
Platelets: 96 10*3/uL — ABNORMAL LOW (ref 150–400)
RBC: 4.43 MIL/uL (ref 3.87–5.11)
RDW: 14.9 % (ref 11.5–15.5)
WBC: 5.7 10*3/uL (ref 4.0–10.5)
nRBC: 0 % (ref 0.0–0.2)

## 2021-06-05 SURGERY — LAPAROSCOPIC CHOLECYSTECTOMY WITH INTRAOPERATIVE CHOLANGIOGRAM
Anesthesia: General

## 2021-06-05 SURGERY — ERCP, WITH INTERVENTION IF INDICATED
Anesthesia: General

## 2021-06-05 MED ORDER — SUGAMMADEX SODIUM 200 MG/2ML IV SOLN
INTRAVENOUS | Status: DC | PRN
Start: 2021-06-05 — End: 2021-06-05
  Administered 2021-06-05: 200 mg via INTRAVENOUS

## 2021-06-05 MED ORDER — LACTATED RINGERS IV SOLN
INTRAVENOUS | Status: AC | PRN
  Administered 2021-06-05: 1000 mL via INTRAVENOUS

## 2021-06-05 MED ORDER — OXYCODONE HCL 5 MG PO TABS
5.0000 mg | ORAL_TABLET | ORAL | Status: DC | PRN
  Filled 2021-06-05: qty 1

## 2021-06-05 MED ORDER — ONDANSETRON HCL 4 MG/2ML IJ SOLN: 4.0000 mg | Freq: Once | INTRAMUSCULAR | Status: DC | PRN

## 2021-06-05 MED ORDER — DEXTROSE 50 % IV SOLN
1.0000 | Freq: Once | INTRAVENOUS | Status: AC
  Administered 2021-06-05: 50 mL via INTRAVENOUS
  Filled 2021-06-05: qty 50

## 2021-06-05 MED ORDER — FENTANYL CITRATE PF 50 MCG/ML IJ SOSY
25.0000 ug | PREFILLED_SYRINGE | INTRAMUSCULAR | Status: DC | PRN
  Administered 2021-06-05 (×2): 50 ug via INTRAVENOUS

## 2021-06-05 MED ORDER — AMISULPRIDE (ANTIEMETIC) 5 MG/2ML IV SOLN: 10.0000 mg | Freq: Once | INTRAVENOUS | Status: DC | PRN

## 2021-06-05 MED ORDER — FENTANYL CITRATE (PF) 250 MCG/5ML IJ SOLN
INTRAMUSCULAR | Status: AC
  Filled 2021-06-05: qty 5

## 2021-06-05 MED ORDER — DEXAMETHASONE SODIUM PHOSPHATE 10 MG/ML IJ SOLN
INTRAMUSCULAR | Status: DC | PRN
  Administered 2021-06-05: 10 mg via INTRAVENOUS

## 2021-06-05 MED ORDER — CHLORHEXIDINE GLUCONATE 0.12 % MT SOLN
15.0000 mL | Freq: Once | OROMUCOSAL | Status: AC
  Administered 2021-06-05: 15 mL via OROMUCOSAL

## 2021-06-05 MED ORDER — DEXTROSE-NACL 5-0.45 % IV SOLN
INTRAVENOUS | Status: DC
Start: 2021-06-05 — End: 2021-06-06

## 2021-06-05 MED ORDER — ONDANSETRON HCL 4 MG/2ML IJ SOLN
INTRAMUSCULAR | Status: DC | PRN
Start: 2021-06-05 — End: 2021-06-05
  Administered 2021-06-05: 4 mg via INTRAVENOUS

## 2021-06-05 MED ORDER — OXYCODONE HCL 5 MG PO TABS: 5.0000 mg | ORAL_TABLET | Freq: Once | ORAL | Status: DC | PRN

## 2021-06-05 MED ORDER — IOPAMIDOL (ISOVUE-300) INJECTION 61%
INTRAVENOUS | Status: DC | PRN
  Administered 2021-06-05: 10 mL

## 2021-06-05 MED ORDER — BUPIVACAINE-EPINEPHRINE (PF) 0.25% -1:200000 IJ SOLN
INTRAMUSCULAR | Status: AC
  Filled 2021-06-05: qty 30

## 2021-06-05 MED ORDER — LACTATED RINGERS IV SOLN
INTRAVENOUS | Status: DC
Start: 2021-06-05 — End: 2021-06-05

## 2021-06-05 MED ORDER — FENTANYL CITRATE (PF) 100 MCG/2ML IJ SOLN
INTRAMUSCULAR | Status: DC | PRN
  Administered 2021-06-05 (×3): 50 ug via INTRAVENOUS

## 2021-06-05 MED ORDER — BUPIVACAINE-EPINEPHRINE 0.25% -1:200000 IJ SOLN
INTRAMUSCULAR | Status: DC | PRN
  Administered 2021-06-05: 19 mL

## 2021-06-05 MED ORDER — DEXTROSE-NACL 5-0.9 % IV SOLN: INTRAVENOUS | Status: DC

## 2021-06-05 MED ORDER — PROPOFOL 10 MG/ML IV BOLUS
INTRAVENOUS | Status: DC | PRN
  Administered 2021-06-05: 120 mg via INTRAVENOUS

## 2021-06-05 MED ORDER — PROPOFOL 10 MG/ML IV BOLUS
INTRAVENOUS | Status: AC
  Filled 2021-06-05: qty 20

## 2021-06-05 MED ORDER — FENTANYL CITRATE PF 50 MCG/ML IJ SOSY
PREFILLED_SYRINGE | INTRAMUSCULAR | Status: AC
  Filled 2021-06-05: qty 2

## 2021-06-05 MED ORDER — LIDOCAINE 2% (20 MG/ML) 5 ML SYRINGE
INTRAMUSCULAR | Status: DC | PRN
  Administered 2021-06-05: 60 mg via INTRAVENOUS

## 2021-06-05 MED ORDER — OXYCODONE HCL 5 MG/5ML PO SOLN: 5.0000 mg | Freq: Once | ORAL | Status: DC | PRN

## 2021-06-05 MED ORDER — ROCURONIUM BROMIDE 10 MG/ML (PF) SYRINGE
PREFILLED_SYRINGE | INTRAVENOUS | Status: DC | PRN
Start: 2021-06-05 — End: 2021-06-05
  Administered 2021-06-05: 60 mg via INTRAVENOUS

## 2021-06-05 SURGICAL SUPPLY — 38 items
ADH SKN CLS APL DERMABOND .7 (GAUZE/BANDAGES/DRESSINGS) ×1
APL PRP STRL LF DISP 70% ISPRP (MISCELLANEOUS) ×1
APPLIER CLIP 5 13 M/L LIGAMAX5 (MISCELLANEOUS) ×2
APR CLP MED LRG 5 ANG JAW (MISCELLANEOUS) ×1
BAG COUNTER SPONGE SURGICOUNT (BAG) IMPLANT
BAG RETRIEVAL 10 (BASKET) ×1
BAG SPNG CNTER NS LX DISP (BAG)
CABLE HIGH FREQUENCY MONO STRZ (ELECTRODE) ×3 IMPLANT
CATH REDDICK CHOLANGI 4FR 50CM (CATHETERS) ×3 IMPLANT
CHLORAPREP W/TINT 26 (MISCELLANEOUS) ×3 IMPLANT
CLIP APPLIE 5 13 M/L LIGAMAX5 (MISCELLANEOUS) ×2 IMPLANT
COVER MAYO STAND XLG (MISCELLANEOUS) ×3 IMPLANT
DERMABOND ADVANCED (GAUZE/BANDAGES/DRESSINGS) ×1
DERMABOND ADVANCED .7 DNX12 (GAUZE/BANDAGES/DRESSINGS) ×2 IMPLANT
DRAPE C-ARM 42X120 X-RAY (DRAPES) ×3 IMPLANT
ELECT REM PT RETURN 15FT ADLT (MISCELLANEOUS) ×3 IMPLANT
GLOVE BIO SURGEON STRL SZ7.5 (GLOVE) ×3 IMPLANT
GOWN STRL REUS W/ TWL LRG LVL3 (GOWN DISPOSABLE) IMPLANT
GOWN STRL REUS W/TWL LRG LVL3 (GOWN DISPOSABLE)
HEMOSTAT SURGICEL 4X8 (HEMOSTASIS) IMPLANT
IRRIG SUCT STRYKERFLOW 2 WTIP (MISCELLANEOUS) ×2
IRRIGATION SUCT STRKRFLW 2 WTP (MISCELLANEOUS) ×2 IMPLANT
IV CATH 14GX2 1/4 (CATHETERS) ×3 IMPLANT
KIT BASIN OR (CUSTOM PROCEDURE TRAY) ×3 IMPLANT
KIT TURNOVER KIT A (KITS) IMPLANT
PENCIL SMOKE EVACUATOR (MISCELLANEOUS) IMPLANT
SCISSORS LAP 5X35 DISP (ENDOMECHANICALS) ×3 IMPLANT
SET TUBE SMOKE EVAC HIGH FLOW (TUBING) ×3 IMPLANT
SLEEVE XCEL OPT CAN 5 100 (ENDOMECHANICALS) ×6 IMPLANT
SPIKE FLUID TRANSFER (MISCELLANEOUS) ×3 IMPLANT
SUT MNCRL AB 4-0 PS2 18 (SUTURE) ×3 IMPLANT
SYS BAG RETRIEVAL 10MM (BASKET) ×1
SYSTEM BAG RETRIEVAL 10MM (BASKET) ×2 IMPLANT
TOWEL OR 17X26 10 PK STRL BLUE (TOWEL DISPOSABLE) ×3 IMPLANT
TOWEL OR NON WOVEN STRL DISP B (DISPOSABLE) ×3 IMPLANT
TRAY LAPAROSCOPIC (CUSTOM PROCEDURE TRAY) ×3 IMPLANT
TROCAR BLADELESS OPT 5 100 (ENDOMECHANICALS) ×3 IMPLANT
TROCAR XCEL BLUNT TIP 100MML (ENDOMECHANICALS) ×3 IMPLANT

## 2021-06-05 NOTE — Plan of Care (Signed)

## 2021-06-05 NOTE — Transfer of Care (Signed)
Immediate Anesthesia Transfer of Care Note ? ?Patient: [name] ? ?Procedure(s) Performed: LAPAROSCOPIC CHOLECYSTECTOMY WITH INTRAOPERATIVE CHOLANGIOGRAM ? ?Patient Location: PACU ? ?Anesthesia Type:General ? ?Level of Consciousness: awake, alert  and oriented ? ?Airway & Oxygen Therapy: Patient Spontanous Breathing and Patient connected to face mask oxygen ? ?Post-op Assessment: Report given to RN and Post -op Vital signs reviewed and stable ? ?Post vital signs: Reviewed and stable ? ?Last Vitals:  ?Vitals Value Taken Time  ?BP 118/61 06/05/21 1334  ?Temp    ?Pulse 74 06/05/21 1336  ?Resp 16 06/05/21 1336  ?SpO2 100 % 06/05/21 1336  ?Vitals shown include unvalidated device data. ? ?Last Pain:  ?Vitals:  ? 06/05/21 1055  ?TempSrc:   ?PainSc: 0-No pain  ?   ? ?Patients Stated Pain Goal: 0 (06/05/21 0100) ? ?Complications: No notable events documented. ?

## 2021-06-05 NOTE — Anesthesia Preprocedure Evaluation (Addendum)
Anesthesia Evaluation  ?Patient identified by MRN, date of birth, ID band ?Patient awake ? ? ? ?Reviewed: ?Allergy & Precautions, NPO status , Patient's Chart, lab work & pertinent test results ? ?Airway ?Mallampati: II ? ?TM Distance: >3 FB ?Neck ROM: Limited ? ? ?Comment: Narrow palate Dental ?no notable dental hx. ? ?  ?Pulmonary ?asthma , COPD,  COPD inhaler,  ?  ?Pulmonary exam normal ?breath sounds clear to auscultation ? ? ? ? ? ? Cardiovascular ?Exercise Tolerance: Poor ?+ Past MI  ?Normal cardiovascular exam ?Rhythm:Regular Rate:Normal ? ?EKG: ST ?  ?Neuro/Psych ? Headaches, PSYCHIATRIC DISORDERS Anxiety Depression Essential tremor ? Neuromuscular disease (mitchondrial myopathy)   ? GI/Hepatic ?Neg liver ROS, GERD  ,cholecystitis ?  ?Endo/Other  ?negative endocrine ROS ? Renal/GU ?Renal InsufficiencyRenal disease  ?negative genitourinary ?  ?Musculoskeletal ?negative musculoskeletal ROS ?(+)  ? Abdominal ?  ?Peds ?negative pediatric ROS ?(+)  Hematology ?negative hematology ROS ?(+)   ?Anesthesia Other Findings ?On xarelto last dose 06/02/21 ? Reproductive/Obstetrics ?negative OB ROS ? ?  ? ? ? ? ? ? ? ? ? ? ? ? ? ?  ?  ? ? ? ? ? ? ? ?Anesthesia Physical ?Anesthesia Plan ? ?ASA: 3 and emergent ? ?Anesthesia Plan: General  ? ?Post-op Pain Management:   ? ?Induction: Intravenous ? ?PONV Risk Score and Plan: 3 and Treatment may vary due to age or medical condition, Dexamethasone and Ondansetron ? ?Airway Management Planned: Oral ETT ? ?Additional Equipment: None ? ?Intra-op Plan:  ? ?Post-operative Plan: Extubation in OR ? ?Informed Consent: I have reviewed the patients History and Physical, chart, labs and discussed the procedure including the risks, benefits and alternatives for the proposed anesthesia with the patient or authorized representative who has indicated his/her understanding and acceptance.  ? ?Patient has DNR.  ?Discussed DNR with patient. ?  ?Dental advisory  given ? ?Plan Discussed with: CRNA, Anesthesiologist and Surgeon ? ?Anesthesia Plan Comments: (Ambulates with assistive devices. Patient has tolerated roc/sugammadex in the past for neuromuscular blockade. Patient would like DNR suspended perioperatively. Norton Blizzard, MD  ?)  ? ? ? ? ? ?Anesthesia Quick Evaluation ? ?

## 2021-06-05 NOTE — Op Note (Signed)
06/05/2021 ? ?1:18 PM ? ?PATIENT:  [name]  76 y.o. female ? ?PRE-OPERATIVE DIAGNOSIS:  Cholecystitis with gallsones ? ?POST-OPERATIVE DIAGNOSIS:  Cholecystitis with gallstones ? ?PROCEDURE:  Procedure(s): ?LAPAROSCOPIC CHOLECYSTECTOMY WITH INTRAOPERATIVE CHOLANGIOGRAM (N/A) ? ?SURGEON:  Surgeon(s) and Role: ?   Jovita Kussmaul, MD - Primary ? ?PHYSICIAN ASSISTANT:  ? ?ASSISTANTS: Judyann Munson, RNFA  ? ?ANESTHESIA:   local and general ? ?EBL:  25 mL  ? ?BLOOD ADMINISTERED:none ? ?DRAINS: none  ? ?LOCAL MEDICATIONS USED:  MARCAINE    ? ?SPECIMEN:  Source of Specimen:  gallbladder ? ?DISPOSITION OF SPECIMEN:  PATHOLOGY ? ?COUNTS:  YES ? ?TOURNIQUET:  * No tourniquets in log * ? ?DICTATION: .Dragon Dictation ? ? ? ?Procedure: After informed consent was obtained the patient was brought to the operating room and placed in the supine position on the operating room table. After adequate induction of general anesthesia the patient's abdomen was prepped with ChloraPrep allowed to dry and draped in usual sterile manner. An appropriate timeout was performed. The area below the umbilicus was infiltrated with quarter percent  Marcaine. A small incision was made with a 15 blade knife. The incision was carried down through the subcutaneous tissue bluntly with a hemostat and [employer] retractors. The linea alba was identified. The linea alba was incised with a 15 blade knife and each side was grasped with Coker clamps. The preperitoneal space was then probed with a hemostat until the peritoneum was opened and access was gained to the abdominal cavity. A 0 Vicryl pursestring stitch was placed in the fascia surrounding the opening. A Hassan cannula was then placed through the opening and anchored in place with the previously placed Vicryl purse string stitch. The abdomen was insufflated with carbon dioxide without difficulty. A laparoscope was inserted through the [facility] cannula in the right upper quadrant was  inspected. Next the epigastric region was infiltrated with ?% Marcaine. A small incision was made with a 15 blade knife. A 5 mm port was placed bluntly through this incision into the abdominal cavity under direct vision. Next 2 sites were chosen laterally on the right side of the abdomen for placement of 5 mm ports. Each of these areas was infiltrated with quarter percent Marcaine. Small stab incisions were made with a 15 blade knife. 5 mm ports were then placed bluntly through these incisions into the abdominal cavity under direct vision without difficulty. A blunt grasper was placed through the lateralmost 5 mm port and used to grasp the dome of the gallbladder and elevate it anteriorly and superiorly. Another blunt grasper was placed through the other 5 mm port and used to retract the body and neck of the gallbladder. A dissector was placed through the epigastric port and using the electrocautery the peritoneal reflection at the gallbladder neck was opened. Blunt dissection was then carried out in this area until the gallbladder neck-cystic duct junction was readily identified and a good window was created. A single clip was placed on the gallbladder neck. A small  ductotomy was made just below the clip with laparoscopic scissors. A 14-gauge Angiocath was then placed through the anterior abdominal wall under direct vision. A Reddick cholangiogram catheter was then placed through the Angiocath and flushed. The catheter was then placed in the cystic duct and anchored in place with a clip. A cholangiogram was obtained that showed no filling defects, good emptying into the duodenum, and an adequate length on the cystic duct. The anchoring clip and catheters were  then removed from the patient. 3 clips were placed proximally on the cystic duct and the duct was divided between the 2 sets of clips. Posterior to this the cystic artery was identified and again dissected bluntly in a circumferential manner until a good  window  was created. 2 clips were placed proximally and one distally on the artery and the artery was divided between the 2 sets of clips. Next a laparoscopic hook cautery device was used to separate the gallbladder from the liver bed. Prior to completely detaching the gallbladder from the liver bed the liver bed was inspected and several small bleeding points were coagulated with the electrocautery until the area was completely hemostatic. The gallbladder was then detached the rest of it from the liver bed without difficulty. A laparoscopic bag was inserted through the hassan port. The laparoscope was moved to the epigastric port. The gallbladder was placed within the bag and the bag was sealed.  The bag with the gallbladder was then removed with the [facility] cannula through the infraumbilical port without difficulty. The fascial defect was then closed with the previously placed Vicryl pursestring stitch as well as with another figure-of-eight 0 Vicryl stitch. The liver bed was inspected again and found to be hemostatic. The abdomen was irrigated with copious amounts of saline until the effluent was clear. The ports were then removed under direct vision without difficulty and were found to be hemostatic. The gas was allowed to escape. No other abnormalities were noted on general inspection of the abdomen other than the liver had a somewhat nodular appearance consistent with cirrhosis. The skin incisions were all closed with interrupted 4-0 Monocryl subcuticular stitches. Dermabond dressings were applied. The patient tolerated the procedure well. At the end of the case all needle sponge and instrument counts were correct. The patient was then awakened and taken to recovery in stable condition  ? ?PLAN OF CARE: Admit to inpatient  ? ?PATIENT DISPOSITION:  PACU - hemodynamically stable. ?  ?Delay start of Pharmacological VTE agent (>24hrs) due to surgical blood loss or risk of bleeding: no ? ?

## 2021-06-05 NOTE — Progress Notes (Signed)
Patient noted to be shaky and sweaty, cbg from previous am labs noted to be low, cbg taken, MD notified. ? ?Virginia Rochester, RN ? ?

## 2021-06-05 NOTE — Progress Notes (Signed)
? ?Subjective/Chief Complaint: ?Complains of bloating.  Wants surgery today. ? ? ?Objective: ?Vital signs in last 24 hours: ?Temp:  [97.4 ?F (36.3 ?C)-97.7 ?F (36.5 ?C)] 97.7 ?F (36.5 ?C) (05/11 0544) ?Pulse Rate:  [72-96] 81 (05/11 0544) ?Resp:  [18-20] 18 (05/11 0544) ?BP: (97-124)/(56-63) 108/56 (05/11 0544) ?SpO2:  [92 %-97 %] 96 % (05/11 0544) ?Weight:  [73.2 kg] 73.2 kg (05/11 0635) ?Last BM Date : 06/01/21 ? ?Intake/Output from previous day: ?05/10 0701 - 05/11 0700 ?In: 994.8 [I.V.:894.8; IV Piggyback:100] ?Out: -  ?Intake/Output this shift: ?No intake/output data recorded. ? ?General appearance: alert and cooperative ?Resp: clear to auscultation bilaterally ?Cardio: regular rate and rhythm ?GI: soft, mild RUQ tenderness ? ?Lab Results:  ?Recent Labs  ?  06/03/21 ?1712 06/04/21 ?1287  ?WBC 9.3 7.6  ?HGB 15.5* 13.2  ?HCT 43.8 38.5  ?PLT 112* 90*  ? ?BMET ?Recent Labs  ?  06/04/21 ?8676 06/05/21 ?0542  ?NA 135 138  ?K 3.7 3.8  ?CL 103 107  ?CO2 26 16*  ?GLUCOSE 108* 55*  ?BUN 7* 16  ?CREATININE 0.48 0.58  ?CALCIUM 7.8* 7.7*  ? ?PT/INR ?Recent Labs  ?  06/04/21 ?1129  ?LABPROT 17.6*  ?INR 1.5*  ? ?ABG ?No results for input(s): PHART, HCO3 in the last 72 hours. ? ?Invalid input(s): PCO2, PO2 ? ?Studies/Results: ?MR 3D Recon At Scanner ? ?Result Date: 06/04/2021 ?CLINICAL DATA:  Acute cholecystitis, jaundice, abdominal pain EXAM: MRI ABDOMEN WITHOUT AND WITH CONTRAST (INCLUDING MRCP) TECHNIQUE: Multiplanar multisequence MR imaging of the abdomen was performed both before and after the administration of intravenous contrast. Heavily T2-weighted images of the biliary and pancreatic ducts were obtained, and three-dimensional MRCP images were rendered by post processing. CONTRAST:  62m GADAVIST GADOBUTROL 1 MMOL/ML IV SOLN COMPARISON:  CT abdomen pelvis, 04/16/2020 FINDINGS: Lower chest: Trace bilateral pleural effusions. Hepatobiliary: No mass or other parenchymal abnormality identified. Somewhat coarse, nodular  contour of the liver with heterogeneous parenchymal contrast enhancement. Numerous small gallstones in the dependent gallbladder. Gallbladder wall thickening and trace pericholecystic fluid. No biliary ductal dilatation or choledocholithiasis. No biliary ductal dilatation. Pancreas: No mass, inflammatory changes, or other parenchymal abnormality identified.No pancreatic ductal dilatation. Spleen:  Within normal limits in size and appearance. Adrenals/Urinary Tract: Normal adrenal glands. No renal masses or suspicious contrast enhancement identified. No evidence of hydronephrosis. Stomach/Bowel: Visualized portions within the abdomen are unremarkable. Vascular/Lymphatic: No pathologically enlarged lymph nodes identified. No abdominal aortic aneurysm demonstrated. Other:  None. Musculoskeletal: No suspicious osseous lesions identified. IMPRESSION: 1. Cholelithiasis with gallbladder wall thickening and trace pericholecystic fluid. Findings are suspicious for acute cholecystitis. 2. No biliary ductal dilatation or choledocholithiasis. 3. Somewhat coarse, nodular contour of the liver with heterogeneous parenchymal contrast enhancement, suggesting cirrhosis. 4. Trace bilateral pleural effusions. Electronically Signed   By: ADelanna AhmadiM.D.   On: 06/04/2021 13:14  ? ?MR ABDOMEN MRCP W WO CONTAST ? ?Result Date: 06/04/2021 ?CLINICAL DATA:  Acute cholecystitis, jaundice, abdominal pain EXAM: MRI ABDOMEN WITHOUT AND WITH CONTRAST (INCLUDING MRCP) TECHNIQUE: Multiplanar multisequence MR imaging of the abdomen was performed both before and after the administration of intravenous contrast. Heavily T2-weighted images of the biliary and pancreatic ducts were obtained, and three-dimensional MRCP images were rendered by post processing. CONTRAST:  741mGADAVIST GADOBUTROL 1 MMOL/ML IV SOLN COMPARISON:  CT abdomen pelvis, 04/16/2020 FINDINGS: Lower chest: Trace bilateral pleural effusions. Hepatobiliary: No mass or other parenchymal  abnormality identified. Somewhat coarse, nodular contour of the liver with heterogeneous parenchymal contrast enhancement. Numerous  small gallstones in the dependent gallbladder. Gallbladder wall thickening and trace pericholecystic fluid. No biliary ductal dilatation or choledocholithiasis. No biliary ductal dilatation. Pancreas: No mass, inflammatory changes, or other parenchymal abnormality identified.No pancreatic ductal dilatation. Spleen:  Within normal limits in size and appearance. Adrenals/Urinary Tract: Normal adrenal glands. No renal masses or suspicious contrast enhancement identified. No evidence of hydronephrosis. Stomach/Bowel: Visualized portions within the abdomen are unremarkable. Vascular/Lymphatic: No pathologically enlarged lymph nodes identified. No abdominal aortic aneurysm demonstrated. Other:  None. Musculoskeletal: No suspicious osseous lesions identified. IMPRESSION: 1. Cholelithiasis with gallbladder wall thickening and trace pericholecystic fluid. Findings are suspicious for acute cholecystitis. 2. No biliary ductal dilatation or choledocholithiasis. 3. Somewhat coarse, nodular contour of the liver with heterogeneous parenchymal contrast enhancement, suggesting cirrhosis. 4. Trace bilateral pleural effusions. Electronically Signed   By: Delanna Ahmadi M.D.   On: 06/04/2021 13:14  ? ?US Abdomen Limited RUQ (LIVER/GB) ? ?Result Date: 06/03/2021 ?CLINICAL DATA:  Right upper quadrant abdominal pain for 1 day. EXAM: ULTRASOUND ABDOMEN LIMITED RIGHT UPPER QUADRANT COMPARISON:  Right upper quadrant ultrasound 04/30/2020 FINDINGS: Gallbladder: Multiple mobile stones are present measuring up to 11 mm. The gallbladder wall is thickened at 3.4 mm. Sonographic Percell Miller sign is reported. Common bile duct: Diameter: 2.6 mm, within normal limits Liver: The liver is mildly echogenic. No discrete lesions are present. Portal vein is patent on color Doppler imaging with normal direction of blood flow towards  the liver. Other: None. IMPRESSION: 1. Cholelithiasis with gallbladder wall thickening and a positive sonographic Murphy sign. Findings consistent with acute cholecystitis. 2. Liver is mildly echogenic.  Question hepatic steatosis. Electronically Signed   By: San Morelle M.D.   On: 06/03/2021 14:21   ? ?Anti-infectives: ?Anti-infectives (From admission, onward)  ? ? Start     Dose/Rate Route Frequency Ordered Stop  ? 06/04/21 1800  cefTRIAXone (ROCEPHIN) 2 g in sodium chloride 0.9 % 100 mL IVPB       ? 2 g ?200 mL/hr over 30 Minutes Intravenous Every 24 hours 06/03/21 1916    ? 06/03/21 1830  cefTRIAXone (ROCEPHIN) 2 g in sodium chloride 0.9 % 100 mL IVPB       ? 2 g ?200 mL/hr over 30 Minutes Intravenous  Once 06/03/21 1823 06/03/21 1906  ? ?  ? ? ?Assessment/Plan: ?s/p Procedure(s): ?ENDOSCOPIC RETROGRADE CHOLANGIOPANCREATOGRAPHY (ERCP) (N/A) ?LFTs improving.  Plan for laparoscopic cholecystectomy today.  I have discussed with her in detail the risks and benefits of the operation as well as some of the technical aspects and she understands and wishes to proceed. ? LOS: 1 day  ? ? ?Autumn Messing III ?06/05/2021 ? ?

## 2021-06-05 NOTE — Interval H&P Note (Signed)
History and Physical Interval Note: ? ?06/05/2021 ?11:51 AM ? ?[name]  has presented today for surgery, with the diagnosis of Cholecystitis.  The various methods of treatment have been discussed with the patient and family. After consideration of risks, benefits and other options for treatment, the patient has consented to  Procedure(s): ?LAPAROSCOPIC CHOLECYSTECTOMY WITH INTRAOPERATIVE CHOLANGIOGRAM (N/A) as a surgical intervention.  The patient's history has been reviewed, patient examined, no change in status, stable for surgery.  I have reviewed the patient's chart and labs.  Questions were answered to the patient's satisfaction.   ? ? ?Autumn Messing III ? ? ?

## 2021-06-05 NOTE — H&P (View-Only) (Signed)
? ?Subjective/Chief Complaint: ?Complains of bloating.  Wants surgery today. ? ? ?Objective: ?Vital signs in last 24 hours: ?Temp:  [97.4 ?F (36.3 ?C)-97.7 ?F (36.5 ?C)] 97.7 ?F (36.5 ?C) (05/11 0544) ?Pulse Rate:  [72-96] 81 (05/11 0544) ?Resp:  [18-20] 18 (05/11 0544) ?BP: (97-124)/(56-63) 108/56 (05/11 0544) ?SpO2:  [92 %-97 %] 96 % (05/11 0544) ?Weight:  [73.2 kg] 73.2 kg (05/11 0635) ?Last BM Date : 06/01/21 ? ?Intake/Output from previous day: ?05/10 0701 - 05/11 0700 ?In: 994.8 [I.V.:894.8; IV Piggyback:100] ?Out: -  ?Intake/Output this shift: ?No intake/output data recorded. ? ?General appearance: alert and cooperative ?Resp: clear to auscultation bilaterally ?Cardio: regular rate and rhythm ?GI: soft, mild RUQ tenderness ? ?Lab Results:  ?Recent Labs  ?  06/03/21 ?1712 06/04/21 ?0932  ?WBC 9.3 7.6  ?HGB 15.5* 13.2  ?HCT 43.8 38.5  ?PLT 112* 90*  ? ?BMET ?Recent Labs  ?  06/04/21 ?3557 06/05/21 ?0542  ?NA 135 138  ?K 3.7 3.8  ?CL 103 107  ?CO2 26 16*  ?GLUCOSE 108* 55*  ?BUN 7* 16  ?CREATININE 0.48 0.58  ?CALCIUM 7.8* 7.7*  ? ?PT/INR ?Recent Labs  ?  06/04/21 ?1129  ?LABPROT 17.6*  ?INR 1.5*  ? ?ABG ?No results for input(s): PHART, HCO3 in the last 72 hours. ? ?Invalid input(s): PCO2, PO2 ? ?Studies/Results: ?MR 3D Recon At Scanner ? ?Result Date: 06/04/2021 ?CLINICAL DATA:  Acute cholecystitis, jaundice, abdominal pain EXAM: MRI ABDOMEN WITHOUT AND WITH CONTRAST (INCLUDING MRCP) TECHNIQUE: Multiplanar multisequence MR imaging of the abdomen was performed both before and after the administration of intravenous contrast. Heavily T2-weighted images of the biliary and pancreatic ducts were obtained, and three-dimensional MRCP images were rendered by post processing. CONTRAST:  26m GADAVIST GADOBUTROL 1 MMOL/ML IV SOLN COMPARISON:  CT abdomen pelvis, 04/16/2020 FINDINGS: Lower chest: Trace bilateral pleural effusions. Hepatobiliary: No mass or other parenchymal abnormality identified. Somewhat coarse, nodular  contour of the liver with heterogeneous parenchymal contrast enhancement. Numerous small gallstones in the dependent gallbladder. Gallbladder wall thickening and trace pericholecystic fluid. No biliary ductal dilatation or choledocholithiasis. No biliary ductal dilatation. Pancreas: No mass, inflammatory changes, or other parenchymal abnormality identified.No pancreatic ductal dilatation. Spleen:  Within normal limits in size and appearance. Adrenals/Urinary Tract: Normal adrenal glands. No renal masses or suspicious contrast enhancement identified. No evidence of hydronephrosis. Stomach/Bowel: Visualized portions within the abdomen are unremarkable. Vascular/Lymphatic: No pathologically enlarged lymph nodes identified. No abdominal aortic aneurysm demonstrated. Other:  None. Musculoskeletal: No suspicious osseous lesions identified. IMPRESSION: 1. Cholelithiasis with gallbladder wall thickening and trace pericholecystic fluid. Findings are suspicious for acute cholecystitis. 2. No biliary ductal dilatation or choledocholithiasis. 3. Somewhat coarse, nodular contour of the liver with heterogeneous parenchymal contrast enhancement, suggesting cirrhosis. 4. Trace bilateral pleural effusions. Electronically Signed   By: ADelanna AhmadiM.D.   On: 06/04/2021 13:14  ? ?MR ABDOMEN MRCP W WO CONTAST ? ?Result Date: 06/04/2021 ?CLINICAL DATA:  Acute cholecystitis, jaundice, abdominal pain EXAM: MRI ABDOMEN WITHOUT AND WITH CONTRAST (INCLUDING MRCP) TECHNIQUE: Multiplanar multisequence MR imaging of the abdomen was performed both before and after the administration of intravenous contrast. Heavily T2-weighted images of the biliary and pancreatic ducts were obtained, and three-dimensional MRCP images were rendered by post processing. CONTRAST:  73mGADAVIST GADOBUTROL 1 MMOL/ML IV SOLN COMPARISON:  CT abdomen pelvis, 04/16/2020 FINDINGS: Lower chest: Trace bilateral pleural effusions. Hepatobiliary: No mass or other parenchymal  abnormality identified. Somewhat coarse, nodular contour of the liver with heterogeneous parenchymal contrast enhancement. Numerous  small gallstones in the dependent gallbladder. Gallbladder wall thickening and trace pericholecystic fluid. No biliary ductal dilatation or choledocholithiasis. No biliary ductal dilatation. Pancreas: No mass, inflammatory changes, or other parenchymal abnormality identified.No pancreatic ductal dilatation. Spleen:  Within normal limits in size and appearance. Adrenals/Urinary Tract: Normal adrenal glands. No renal masses or suspicious contrast enhancement identified. No evidence of hydronephrosis. Stomach/Bowel: Visualized portions within the abdomen are unremarkable. Vascular/Lymphatic: No pathologically enlarged lymph nodes identified. No abdominal aortic aneurysm demonstrated. Other:  None. Musculoskeletal: No suspicious osseous lesions identified. IMPRESSION: 1. Cholelithiasis with gallbladder wall thickening and trace pericholecystic fluid. Findings are suspicious for acute cholecystitis. 2. No biliary ductal dilatation or choledocholithiasis. 3. Somewhat coarse, nodular contour of the liver with heterogeneous parenchymal contrast enhancement, suggesting cirrhosis. 4. Trace bilateral pleural effusions. Electronically Signed   By: Delanna Ahmadi M.D.   On: 06/04/2021 13:14  ? ?US Abdomen Limited RUQ (LIVER/GB) ? ?Result Date: 06/03/2021 ?CLINICAL DATA:  Right upper quadrant abdominal pain for 1 day. EXAM: ULTRASOUND ABDOMEN LIMITED RIGHT UPPER QUADRANT COMPARISON:  Right upper quadrant ultrasound 04/30/2020 FINDINGS: Gallbladder: Multiple mobile stones are present measuring up to 11 mm. The gallbladder wall is thickened at 3.4 mm. Sonographic Percell Miller sign is reported. Common bile duct: Diameter: 2.6 mm, within normal limits Liver: The liver is mildly echogenic. No discrete lesions are present. Portal vein is patent on color Doppler imaging with normal direction of blood flow towards  the liver. Other: None. IMPRESSION: 1. Cholelithiasis with gallbladder wall thickening and a positive sonographic Murphy sign. Findings consistent with acute cholecystitis. 2. Liver is mildly echogenic.  Question hepatic steatosis. Electronically Signed   By: San Morelle M.D.   On: 06/03/2021 14:21   ? ?Anti-infectives: ?Anti-infectives (From admission, onward)  ? ? Start     Dose/Rate Route Frequency Ordered Stop  ? 06/04/21 1800  cefTRIAXone (ROCEPHIN) 2 g in sodium chloride 0.9 % 100 mL IVPB       ? 2 g ?200 mL/hr over 30 Minutes Intravenous Every 24 hours 06/03/21 1916    ? 06/03/21 1830  cefTRIAXone (ROCEPHIN) 2 g in sodium chloride 0.9 % 100 mL IVPB       ? 2 g ?200 mL/hr over 30 Minutes Intravenous  Once 06/03/21 1823 06/03/21 1906  ? ?  ? ? ?Assessment/Plan: ?s/p Procedure(s): ?ENDOSCOPIC RETROGRADE CHOLANGIOPANCREATOGRAPHY (ERCP) (N/A) ?LFTs improving.  Plan for laparoscopic cholecystectomy today.  I have discussed with her in detail the risks and benefits of the operation as well as some of the technical aspects and she understands and wishes to proceed. ? LOS: 1 day  ? ? ?[name] ?06/05/2021 ? ?

## 2021-06-05 NOTE — Progress Notes (Signed)
Transition of Care (TOC) Screening Note ? ?Patient Details  ?Name: [name] ?Date of Birth: 1945/05/17 ? ?Transition of Care (TOC) CM/SW Contact:    ?Sherie Don, LCSW ?Phone Number: ?06/05/2021, 11:54 AM ? ?Transition of Care Department [facility]) has reviewed patient and no TOC needs have been identified at this time. We will continue to monitor patient advancement through interdisciplinary progression rounds. If new patient transition needs arise, please place a TOC consult. ?

## 2021-06-05 NOTE — Progress Notes (Addendum)
[facility] ? ?PROGRESS NOTE ? ?[name] QHU:[id number] DOB: 1945-06-15 DOA: 06/03/2021 ?PCP: Velna Hatchet, MD ? ? ?Brief HPI:   ?76 year old female with medical history of anemia, anxiety, insomnia, tremor, migraine, COPD, IBS, polycythemia vera, pulmonary embolism, lupus anticoagulant presented with abdominal pain.  Abdominal ultrasound showed cholelithiasis with gallbladder wall thickening and positive sonographic Murphy sign.  Also shows hepatic steatosis. ?GI was consulted, MRCP was ordered.  MRCP showed cholelithiasis with gallbladder wall thickening and trace pericholecystic fluid.  Findings suspicious for acute cholecystitis.  Somewhat coarse nodular contour of liver with heterogeneous parenchymal contrast-enhancement suggesting cirrhosis..  No bili ductal dilatation or choledocholithiasis. ? ? ? ?Subjective  ? ?Patient seen and examined, plan for cholecystectomy today. ? ? Assessment/Plan:  ? ? ?Acute cholecystitis ?-Seen on abdominal ultrasound ?-MRCP negative for choledocholithiasis ?-Xarelto on hold ?-LFTs are improving ?-General surgery consulted; plan for cholecystectomy today ?-Continue ceftriaxone ? ?Hypoglycemia ?-Patient is n.p.o. ?-We will start patient on D5 half-normal saline at 100 mL/h ? ?Hypokalemia ?-Replete ? ?History of pulmonary embolism ?-Xarelto on hold in anticipation for surgery  ? ?Anxiety/insomnia ?-Continue sertraline ?-Continue as needed Ativan ? ?COPD ?-Continue Brovana twice daily ?-Continue umeclidinium 1 puff daily ? ?Tremors ?-Continue propranolol ? ? ?Medications ? ?  ? [MAR Hold] arformoterol  15 mcg Nebulization BID  ? And  ? [MAR Hold] umeclidinium bromide  1 puff Inhalation Daily  ? fentaNYL      ? [MAR Hold] pantoprazole  40 mg Oral Daily  ? [MAR Hold] propranolol  20 mg Oral Daily  ? [MAR Hold] sodium chloride flush  3 mL Intravenous Q12H  ? ? ? Data Reviewed:  ? ?CBG: ? ?Recent Labs  ?Lab 06/05/21 ?0735 06/05/21 ?4656 06/05/21 ?1030  ?GLUCAP 56* 144*  85  ? ? ?SpO2: 100 % ?O2 Flow Rate (L/min): 2 L/min  ? ? ?Vitals:  ? 06/05/21 1036 06/05/21 1334 06/05/21 1345 06/05/21 1400  ?BP: 123/61 118/61 (!) 111/54 110/60  ?Pulse: 81 72 73 71  ?Resp: '16 16 12 13  '$ ?Temp: 97.9 ?F (36.6 ?C) 97.7 ?F (36.5 ?C)    ?TempSrc: Oral     ?SpO2: 96% 95% 100% 100%  ?Weight:      ?Height:      ? ? ? ? ?Data Reviewed: ? ?Basic Metabolic Panel: ?Recent Labs  ?Lab 06/03/21 ?1712 06/04/21 ?8127 06/05/21 ?0542  ?NA 135 135 138  ?K 3.4* 3.7 3.8  ?CL 99 103 107  ?CO2 26 26 16*  ?GLUCOSE 125* 108* 55*  ?BUN 8 7* 16  ?CREATININE 0.54 0.48 0.58  ?CALCIUM 8.8* 7.8* 7.7*  ?MG 1.7  --   --   ? ? ?CBC: ?Recent Labs  ?Lab 06/03/21 ?1712 06/04/21 ?5170 06/05/21 ?0919  ?WBC 9.3 7.6 5.7  ?NEUTROABS 7.9*  --  4.8  ?HGB 15.5* 13.2 14.5  ?HCT 43.8 38.5 44.8  ?MCV 93.6 95.3 101.1*  ?PLT 112* 90* 96*  ? ? ?LFT ?Recent Labs  ?Lab 06/03/21 ?1712 06/04/21 ?0174 06/05/21 ?0542  ?AST 492* 301* 165*  ?ALT 334* 264* 198*  ?ALKPHOS 201* 170* 167*  ?BILITOT 6.1* 7.3* 5.8*  ?PROT 6.8 5.9* 6.1*  ?ALBUMIN 3.3* 2.8* 2.7*  ? ?  ?Antibiotics: ?Anti-infectives (From admission, onward)  ? ? Start     Dose/Rate Route Frequency Ordered Stop  ? 06/04/21 1800  [MAR Hold]  cefTRIAXone (ROCEPHIN) 2 g in sodium chloride 0.9 % 100 mL IVPB        (MAR Hold since [facility] 06/05/2021  at 1017.Hold Reason: Transfer to a Procedural area)  ? 2 g ?200 mL/hr over 30 Minutes Intravenous Every 24 hours 06/03/21 1916    ? 06/03/21 1830  cefTRIAXone (ROCEPHIN) 2 g in sodium chloride 0.9 % 100 mL IVPB       ? 2 g ?200 mL/hr over 30 Minutes Intravenous  Once 06/03/21 1823 06/03/21 1906  ? ?  ? ? ? ?DVT prophylaxis: SCDs ? ?Code Status: Full code ? ?Family Communication: No family at bedside ? ? ?CONSULTS gastroenterology ? ? ?Objective  ? ? ?Physical Examination: ? ? ?General-appears in no acute distress ?Heart-S1-S2, regular, no murmur auscultated ?Lungs-clear to auscultation bilaterally, no wheezing or crackles auscultated ?Abdomen-soft, nontender, no  organomegaly ?Extremities-no edema in the lower extremities ?Neuro-alert, oriented x3, no focal deficit noted ? ? ?Status is: Inpatient: Cholecystitis ? ? ? ?  ? ? ?Oswald Hillock ?  ?[facility] ?If 7PM-7AM, please contact night-coverage at [email or web address], ?Office  [phone] ? ? ?06/05/2021, 2:17 PM  LOS: 1 day  ? ? ? ? ? ? ? ? ? ? ?  ?

## 2021-06-05 NOTE — Discharge Instructions (Signed)
[facility] LAPAROSCOPIC SURGERY: POST OP INSTRUCTIONS Always review your discharge instruction sheet given to you by the facility where your surgery was performed. IF YOU HAVE DISABILITY OR FAMILY LEAVE FORMS, YOU MUST BRING THEM TO THE OFFICE FOR PROCESSING.   DO NOT GIVE THEM TO YOUR DOCTOR.  PAIN CONTROL  First take acetaminophen (Tylenol) AND/or ibuprofen (Advil) to control your pain after surgery.  Follow directions on package.  Taking acetaminophen (Tylenol) and/or ibuprofen (Advil) regularly after surgery will help to control your pain and lower the amount of prescription pain medication you may need.  You should not take more than 3,000 mg (3 grams) of acetaminophen (Tylenol) in 24 hours.  You should not take ibuprofen (Advil), aleve, motrin, naprosyn or other NSAIDS if you have a history of stomach ulcers or chronic kidney disease.  A prescription for pain medication may be given to you upon discharge.  Take your pain medication as prescribed, if you still have uncontrolled pain after taking acetaminophen (Tylenol) or ibuprofen (Advil). Use ice packs to help control pain. If you need a refill on your pain medication, please contact your pharmacy.  They will contact our office to request authorization. Prescriptions will not be filled after 5pm or on week-ends.  HOME MEDICATIONS Take your usually prescribed medications unless otherwise directed.  DIET You should follow a light diet the first few days after arrival home.  Be sure to include lots of fluids daily. Avoid fatty, fried foods.   CONSTIPATION It is common to experience some constipation after surgery and if you are taking pain medication.  Increasing fluid intake and taking a stool softener (such as Colace) will usually help or prevent this problem from occurring.  A mild laxative (Milk of Magnesia or Miralax) should be taken according to package instructions if there are no bowel movements after 48  hours.  WOUND/INCISION CARE Most patients will experience some swelling and bruising in the area of the incisions.  Ice packs will help.  Swelling and bruising can take several days to resolve.  Unless discharge instructions indicate otherwise, follow guidelines below  STERI-STRIPS - you may remove your outer bandages 48 hours after surgery, and you may shower at that time.  You have steri-strips (small skin tapes) in place directly over the incision.  These strips should be left on the skin for 7-10 days.   DERMABOND/SKIN GLUE - you may shower in 24 hours.  The glue will flake off over the next 2-3 weeks. Any sutures or staples will be removed at the office during your follow-up visit.  ACTIVITIES You may resume regular (light) daily activities beginning the next day--such as daily self-care, walking, climbing stairs--gradually increasing activities as tolerated.  You may have sexual intercourse when it is comfortable.  Refrain from any heavy lifting or straining until approved by your doctor. You may drive when you are no longer taking prescription pain medication, you can comfortably wear a seatbelt, and you can safely maneuver your car and apply brakes.  FOLLOW-UP You should see your doctor in the office for a follow-up appointment approximately 2-3 weeks after your surgery.  You should have been given your post-op/follow-up appointment when your surgery was scheduled.  If you did not receive a post-op/follow-up appointment, make sure that you call for this appointment within a day or two after you arrive home to insure a convenient appointment time.   WHEN TO CALL YOUR DOCTOR: Fever over 101.0 Inability to urinate Continued bleeding from incision.   Increased pain, redness, or drainage from the incision. Increasing abdominal pain  The clinic staff is available to answer your questions during regular business hours.  Please don't hesitate to call and ask to speak to one of the nurses for  clinical concerns.  If you have a medical emergency, go to the nearest emergency room or call 911.  A surgeon from [facility] is always on call at the hospital. [address], [address], [place], [place]  [address] ? [address] [address], [place], [place]   [address] [phone] ? [phone] ? FAX [phone] Web site: [email or web address]  

## 2021-06-05 NOTE — Anesthesia Procedure Notes (Signed)
Procedure Name: Intubation ?Date/Time: 06/05/2021 12:34 PM ?Performed by: Gean Maidens, CRNA ?Pre-anesthesia Checklist: Patient identified, Emergency Drugs available, Suction available, Patient being monitored and Timeout performed ?Patient Re-evaluated:Patient Re-evaluated prior to induction ?Oxygen Delivery Method: Circle system utilized ?Preoxygenation: Pre-oxygenation with 100% oxygen ?Induction Type: IV induction ?Ventilation: Mask ventilation without difficulty ?Laryngoscope Size: Mac and 3 ?Grade View: Grade I ?Tube type: Oral ?Tube size: 7.0 mm ?Number of attempts: 1 ?Airway Equipment and Method: Stylet ?Placement Confirmation: ETT inserted through vocal cords under direct vision, positive ETCO2 and breath sounds checked- equal and bilateral ?Secured at: 21 cm ?Tube secured with: Tape ?Dental Injury: Teeth and Oropharynx as per pre-operative assessment  ? ? ? ? ?

## 2021-06-06 ENCOUNTER — Encounter (HOSPITAL_COMMUNITY): Payer: Self-pay | Admitting: General Surgery

## 2021-06-06 DIAGNOSIS — K810 Acute cholecystitis: Secondary | ICD-10-CM | POA: Diagnosis not present

## 2021-06-06 LAB — COMPREHENSIVE METABOLIC PANEL
ALT: 145 U/L — ABNORMAL HIGH (ref 0–44)
AST: 102 U/L — ABNORMAL HIGH (ref 15–41)
Albumin: 2.5 g/dL — ABNORMAL LOW (ref 3.5–5.0)
Alkaline Phosphatase: 154 U/L — ABNORMAL HIGH (ref 38–126)
Anion gap: 10 (ref 5–15)
BUN: 9 mg/dL (ref 8–23)
CO2: 17 mmol/L — ABNORMAL LOW (ref 22–32)
Calcium: 7.2 mg/dL — ABNORMAL LOW (ref 8.9–10.3)
Chloride: 102 mmol/L (ref 98–111)
Creatinine, Ser: 0.54 mg/dL (ref 0.44–1.00)
GFR, Estimated: >60 mL/min (ref >60)
Glucose, Bld: 224 mg/dL — ABNORMAL HIGH (ref 70–99)
Potassium: 4.6 mmol/L (ref 3.5–5.1)
Sodium: 129 mmol/L — ABNORMAL LOW (ref 135–145)
Total Bilirubin: 2.9 mg/dL — ABNORMAL HIGH (ref 0.3–1.2)
Total Protein: 6 g/dL — ABNORMAL LOW (ref 6.5–8.1)

## 2021-06-06 LAB — CBC
HCT: 41.4 % (ref 36.0–46.0)
Hemoglobin: 13.9 g/dL (ref 12.0–15.0)
MCH: 33.1 pg (ref 26.0–34.0)
MCHC: 33.6 g/dL (ref 30.0–36.0)
MCV: 98.6 fL (ref 80.0–100.0)
Platelets: 109 10*3/uL — ABNORMAL LOW (ref 150–400)
RBC: 4.2 MIL/uL (ref 3.87–5.11)
RDW: 14.6 % (ref 11.5–15.5)
WBC: 6 10*3/uL (ref 4.0–10.5)
nRBC: 0 % (ref 0.0–0.2)

## 2021-06-06 MED ORDER — SODIUM CHLORIDE 0.9 % IV SOLN: INTRAVENOUS | Status: DC

## 2021-06-06 MED ORDER — RIVAROXABAN 20 MG PO TABS: 20.0000 mg | ORAL_TABLET | Freq: Every day | ORAL | Status: DC

## 2021-06-06 MED ORDER — OXYCODONE HCL 5 MG PO TABS: 5.0000 mg | ORAL_TABLET | Freq: Four times a day (QID) | ORAL | 0 refills | Status: DC | PRN

## 2021-06-06 NOTE — Anesthesia Postprocedure Evaluation (Signed)
Anesthesia Post Note ? ?Patient: [name] ? ?Procedure(s) Performed: LAPAROSCOPIC CHOLECYSTECTOMY WITH INTRAOPERATIVE CHOLANGIOGRAM ? ?  ? ?Patient location during evaluation: Endoscopy ?Anesthesia Type: General ?Level of consciousness: awake and alert ?Pain management: pain level controlled ?Vital Signs Assessment: post-procedure vital signs reviewed and stable ?Respiratory status: spontaneous breathing, nonlabored ventilation, respiratory function stable and patient connected to nasal cannula oxygen ?Cardiovascular status: blood pressure returned to baseline and stable ?Postop Assessment: no apparent nausea or vomiting ?Anesthetic complications: no ? ? ?No notable events documented. ? ?Last Vitals:  ?Vitals:  ? 06/05/21 2105 06/06/21 0409  ?BP: 109/67 102/66  ?Pulse: 68 69  ?Resp: 18 18  ?Temp: 36.5 ?C (!) 36.3 ?C  ?SpO2: 96% 97%  ?  ?Last Pain:  ?Vitals:  ? 06/05/21 2339  ?TempSrc:   ?PainSc: 4   ? ? ?  ?  ?  ?  ?  ?  ? ?Merlinda Frederick ? ? ? ? ?

## 2021-06-06 NOTE — Progress Notes (Signed)
[facility] ?Progress Note ? ?1 Day Post-Op  ?Subjective: ?CC:  ?States she feels well and wants to go home. Tolerating CLD. +flatus, denies BM.  ? ?Objective: ?Vital signs in last 24 hours: ?Temp:  [97.4 ?F (36.3 ?C)-98.8 ?F (37.1 ?C)] 97.4 ?F (36.3 ?C) (05/12 0409) ?Pulse Rate:  [68-81] 69 (05/12 0409) ?Resp:  [12-18] 18 (05/12 0409) ?BP: (102-123)/(51-67) 102/66 (05/12 0409) ?SpO2:  [95 %-100 %] 98 % (05/12 0818) ?Last BM Date : 06/01/21 ? ?Intake/Output from previous day: ?05/11 0701 - 05/12 0700 ?In: 2979 [P.O.:590; I.V.:2844; IV Piggyback:100] ?Out: 25 [Blood:25] ?Intake/Output this shift: ?No intake/output data recorded. ? ?PE: ?Gen:  Alert, NAD, pleasant ?Card:  Regular rate and rhythm, pedal pulses 2+ BL ?Pulm:  Normal effort, clear to auscultation bilaterally ?Abd: Soft, appropriately tender, incisions c/d/I, mild distention.  ?Skin: warm and dry, no rashes  ?Psych: A&Ox3  ? ?Lab Results:  ?Recent Labs  ?  06/05/21 ?8921 06/06/21 ?1941  ?WBC 5.7 6.0  ?HGB 14.5 13.9  ?HCT 44.8 41.4  ?PLT 96* 109*  ? ?BMET ?Recent Labs  ?  06/05/21 ?7408 06/06/21 ?0454  ?NA 138 129*  ?K 3.8 4.6  ?CL 107 102  ?CO2 16* 17*  ?GLUCOSE 55* 224*  ?BUN 16 9  ?CREATININE 0.58 0.54  ?CALCIUM 7.7* 7.2*  ? ?PT/INR ?Recent Labs  ?  06/04/21 ?1129  ?LABPROT 17.6*  ?INR 1.5*  ? ?CMP  ?   ?Component Value Date/Time  ? NA 129 (L) 06/06/2021 0454  ? NA 144 01/20/2017 1344  ? NA 138 01/17/2016 1356  ? K 4.6 06/06/2021 0454  ? K 3.6 01/20/2017 1344  ? K 4.1 01/17/2016 1356  ? CL 102 06/06/2021 0454  ? CL 107 01/20/2017 1344  ? CO2 17 (L) 06/06/2021 0454  ? CO2 25 01/20/2017 1344  ? CO2 25 01/17/2016 1356  ? GLUCOSE 224 (H) 06/06/2021 0454  ? GLUCOSE 108 01/20/2017 1344  ? BUN 9 06/06/2021 0454  ? BUN 12 01/20/2017 1344  ? BUN 12.3 01/17/2016 1356  ? CREATININE 0.54 06/06/2021 0454  ? CREATININE 0.65 04/25/2021 1253  ? CREATININE 0.7 01/20/2017 1344  ? CREATININE 0.8 01/17/2016 1356  ? CALCIUM 7.2 (L) 06/06/2021 0454  ? CALCIUM 8.9  01/20/2017 1344  ? CALCIUM 9.1 01/17/2016 1356  ? PROT 6.0 (L) 06/06/2021 0454  ? PROT 6.5 01/20/2017 1344  ? PROT 7.0 01/17/2016 1356  ? ALBUMIN 2.5 (L) 06/06/2021 0454  ? ALBUMIN 3.1 (L) 01/20/2017 1344  ? ALBUMIN 3.7 07/08/2016 1306  ? ALBUMIN 3.5 01/17/2016 1356  ? AST 102 (H) 06/06/2021 0454  ? AST 33 04/25/2021 1253  ? AST 48 (H) 01/17/2016 1356  ? ALT 145 (H) 06/06/2021 0454  ? ALT 23 04/25/2021 1253  ? ALT 49 (H) 01/20/2017 1344  ? ALT 38 01/17/2016 1356  ? ALKPHOS 154 (H) 06/06/2021 0454  ? ALKPHOS 68 01/20/2017 1344  ? ALKPHOS 87 01/17/2016 1356  ? BILITOT 2.9 (H) 06/06/2021 0454  ? BILITOT 1.0 04/25/2021 1253  ? BILITOT 0.67 01/17/2016 1356  ? GFRNONAA >60 06/06/2021 0454  ? GFRNONAA >60 04/25/2021 1253  ? GFRAA >60 10/09/2019 1339  ? ?Lipase  ?   ?Component Value Date/Time  ? LIPASE 25 06/03/2021 1712  ? ? ? ? ? ?Studies/Results: ?MR 3D Recon At Scanner ? ?Result Date: 06/04/2021 ?CLINICAL DATA:  Acute cholecystitis, jaundice, abdominal pain EXAM: MRI ABDOMEN WITHOUT AND WITH CONTRAST (INCLUDING MRCP) TECHNIQUE: Multiplanar multisequence MR imaging of the abdomen was  performed both before and after the administration of intravenous contrast. Heavily T2-weighted images of the biliary and pancreatic ducts were obtained, and three-dimensional MRCP images were rendered by post processing. CONTRAST:  45m GADAVIST GADOBUTROL 1 MMOL/ML IV SOLN COMPARISON:  CT abdomen pelvis, 04/16/2020 FINDINGS: Lower chest: Trace bilateral pleural effusions. Hepatobiliary: No mass or other parenchymal abnormality identified. Somewhat coarse, nodular contour of the liver with heterogeneous parenchymal contrast enhancement. Numerous small gallstones in the dependent gallbladder. Gallbladder wall thickening and trace pericholecystic fluid. No biliary ductal dilatation or choledocholithiasis. No biliary ductal dilatation. Pancreas: No mass, inflammatory changes, or other parenchymal abnormality identified.No pancreatic ductal  dilatation. Spleen:  Within normal limits in size and appearance. Adrenals/Urinary Tract: Normal adrenal glands. No renal masses or suspicious contrast enhancement identified. No evidence of hydronephrosis. Stomach/Bowel: Visualized portions within the abdomen are unremarkable. Vascular/Lymphatic: No pathologically enlarged lymph nodes identified. No abdominal aortic aneurysm demonstrated. Other:  None. Musculoskeletal: No suspicious osseous lesions identified. IMPRESSION: 1. Cholelithiasis with gallbladder wall thickening and trace pericholecystic fluid. Findings are suspicious for acute cholecystitis. 2. No biliary ductal dilatation or choledocholithiasis. 3. Somewhat coarse, nodular contour of the liver with heterogeneous parenchymal contrast enhancement, suggesting cirrhosis. 4. Trace bilateral pleural effusions. Electronically Signed   By: ADelanna AhmadiM.D.   On: 06/04/2021 13:14  ? ?DG C-Arm 1-60 Min-No Report ? ?Result Date: 06/05/2021 ?Fluoroscopy was utilized by the requesting physician.  No radiographic interpretation.  ? ?MR ABDOMEN MRCP W WO CONTAST ? ?Result Date: 06/04/2021 ?CLINICAL DATA:  Acute cholecystitis, jaundice, abdominal pain EXAM: MRI ABDOMEN WITHOUT AND WITH CONTRAST (INCLUDING MRCP) TECHNIQUE: Multiplanar multisequence MR imaging of the abdomen was performed both before and after the administration of intravenous contrast. Heavily T2-weighted images of the biliary and pancreatic ducts were obtained, and three-dimensional MRCP images were rendered by post processing. CONTRAST:  797mGADAVIST GADOBUTROL 1 MMOL/ML IV SOLN COMPARISON:  CT abdomen pelvis, 04/16/2020 FINDINGS: Lower chest: Trace bilateral pleural effusions. Hepatobiliary: No mass or other parenchymal abnormality identified. Somewhat coarse, nodular contour of the liver with heterogeneous parenchymal contrast enhancement. Numerous small gallstones in the dependent gallbladder. Gallbladder wall thickening and trace pericholecystic  fluid. No biliary ductal dilatation or choledocholithiasis. No biliary ductal dilatation. Pancreas: No mass, inflammatory changes, or other parenchymal abnormality identified.No pancreatic ductal dilatation. Spleen:  Within normal limits in size and appearance. Adrenals/Urinary Tract: Normal adrenal glands. No renal masses or suspicious contrast enhancement identified. No evidence of hydronephrosis. Stomach/Bowel: Visualized portions within the abdomen are unremarkable. Vascular/Lymphatic: No pathologically enlarged lymph nodes identified. No abdominal aortic aneurysm demonstrated. Other:  None. Musculoskeletal: No suspicious osseous lesions identified. IMPRESSION: 1. Cholelithiasis with gallbladder wall thickening and trace pericholecystic fluid. Findings are suspicious for acute cholecystitis. 2. No biliary ductal dilatation or choledocholithiasis. 3. Somewhat coarse, nodular contour of the liver with heterogeneous parenchymal contrast enhancement, suggesting cirrhosis. 4. Trace bilateral pleural effusions. Electronically Signed   By: AlDelanna Ahmadi.D.   On: 06/04/2021 13:14   ? ?Anti-infectives: ?Anti-infectives (From admission, onward)  ? ? Start     Dose/Rate Route Frequency Ordered Stop  ? 06/04/21 1800  cefTRIAXone (ROCEPHIN) 2 g in sodium chloride 0.9 % 100 mL IVPB       ? 2 g ?200 mL/hr over 30 Minutes Intravenous Every 24 hours 06/03/21 1916    ? 06/03/21 1830  cefTRIAXone (ROCEPHIN) 2 g in sodium chloride 0.9 % 100 mL IVPB       ? 2 g ?200 mL/hr over 30 Minutes Intravenous  Once  06/03/21 1823 06/03/21 1906  ? ?  ? ? ? ?Assessment/Plan ?Calculous cholecystitis  ? POD#1 s/p laparoscopic cholecystomy with IOC 06/05/21 Dr. Marlou Starks - IOC negative, interpreted by Dr. Marlou Starks ?- afebrile, VSS, WBC WNL, LFTs downtrending  ?- clinically she feels better. Tolerating CLD. Voiding. +Flatus. Ambulating to bathroom with assistance. ADAT. Stable for discharge from a surgical perspective once she has mobilized safely and  tolerated solid diet. Will confirm resumption of eliquis date with MD (on for remote Hx PE). Outpatient follow up arranged.  ? ? ? LOS: 2 days  ? ?I reviewed nursing notes, hospitalist notes, last 24 h vitals a

## 2021-06-06 NOTE — Discharge Summary (Signed)
?Physician Discharge Summary ?  ?Patient: [name] MRN: [id number] DOB: [birth year]  ?Admit date:     06/03/2021  ?Discharge date: 06/06/21  ?Discharge Physician: Oswald Hillock  ? ?PCP: Velna Hatchet, MD  ? ?Recommendations at discharge:  ? ?Follow-up general surgery as outpatient ? ?Discharge Diagnoses: ?Principal Problem: ?  Acute cholecystitis ?Active Problems: ?  Pernicious anemia ?  Anxiety state ?  COPD (chronic obstructive pulmonary disease) ([place]) ?  Irritable bowel syndrome ?  INSOMNIA UNSPECIFIED ?  Laryngopharyngeal reflux (LPR) ?  History of pulmonary embolus (PE) ? ?Resolved Problems: ?  * No resolved hospital problems. * ? ?Hospital Course: ?76 year old female with medical history of anemia, anxiety, insomnia, tremor, migraine, COPD, IBS, polycythemia vera, pulmonary embolism, lupus anticoagulant presented with abdominal pain.  Abdominal ultrasound showed cholelithiasis with gallbladder wall thickening and positive sonographic Murphy sign.  Also shows hepatic steatosis. ?GI was consulted, MRCP was ordered.  MRCP showed cholelithiasis with gallbladder wall thickening and trace pericholecystic fluid.  Findings suspicious for acute cholecystitis.  Somewhat coarse nodular contour of liver with heterogeneous parenchymal contrast-enhancement suggesting cirrhosis..  No bili ductal dilatation or choledocholithiasis. ? ?Assessment and Plan: ? ?Acute cholecystitis ?-Seen on abdominal ultrasound ?-MRCP negative for choledocholithiasis ?-Xarelto on hold ?-LFTs are improving ?-General surgery consulted; patient underwent cholecystectomy  ?-Tolerating diet well, will discharge home ?  ?Hypoglycemia ?-Developed hypoglycemia due to being n.p.o. ?-Resolved ? ?Hyponatremia; mild ?-Secondary to hypotonic solution she was given  ? 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  ?Hypokalemia ?-Replete ?  ?History of pulmonary embolism ?-Xarelto has been restarted ?   ?Anxiety/insomnia ?-Continue sertraline ?-Continue as needed Ativan ?  ?COPD ?-Continue Brovana twice daily ?-Continue umeclidinium 1 puff daily ?  ?Tremors ?-Continue propranolol ? ? ?  ? ? ?Consultants: General surgery ?Procedures performed: Cholecystectomy ?Disposition: Home ?Diet recommendation:  ?Discharge Diet Orders (From admission, onward)  ? ?  Start     Ordered  ? 06/06/21 0000  Diet - low sodium heart healthy       ? 06/06/21 1616  ? ?  ?  ? ?  ? ?Regular diet ?DISCHARGE MEDICATION: ?Allergies as of 06/06/2021   ? ?   Reactions  ? Dulera [mometasone Furo-formoterol Fum] Cough  ? ?  ? ?  ?Medication List  ?  ? ?TAKE these medications   ? ?albuterol 108 (90 Base) MCG/ACT inhaler ?Commonly known as: VENTOLIN HFA ?Inhale 2 puffs into the lungs every 4 (four) hours as needed for wheezing or shortness of breath. ?  ?folic acid 1 MG tablet ?Commonly known as: FOLVITE ?TAKE 1 TABLET(1 MG) BY MOUTH DAILY ?What changed: See the new instructions. ?  ?LORazepam 0.5 MG tablet ?Commonly known as: ATIVAN ?TAKE 1 TABLET BY MOUTH EVERY NIGHT AT BEDTIME ?  ?ondansetron 4 MG disintegrating tablet ?Commonly known as: ZOFRAN-ODT ?Take 4 mg by mouth every 6 (six) hours as needed for nausea or vomiting. ?  ?oxyCODONE 5 MG immediate release tablet ?Commonly known as: Oxy IR/ROXICODONE ?Take 1 tablet (5 mg total) by mouth every 6 (six) hours as needed for moderate pain. ?  ?pantoprazole 40 MG tablet ?Commonly known as: PROTONIX ?Take 40 mg by mouth daily. ?  ?propranolol 20 MG tablet ?Commonly known as: INDERAL ?Take 20 mg by mouth daily. ?  ?Stiolto Respimat 2.5-2.5 MCG/ACT Aers ?Generic drug: Tiotropium Bromide-Olodaterol ?INHALE 2 PUFFS INTO THE LUNGS DAILY ?  ?Vitamin D (Ergocalciferol) 1.25 MG (50000 UNIT) Caps capsule ?Commonly known as: DRISDOL ?Take  1 capsule (50,000 Units total) by mouth every 7 (seven) days. Sundays ?  ?Xarelto 20 MG Tabs tablet ?Generic drug: rivaroxaban ?TAKE 1 TABLET(20 MG) BY MOUTH DAILY WITH  SUPPER ?What changed: See the new instructions. ?  ? ?  ? ? Follow-up Information   ? ? Maczis, Carlena Hurl, PA-C Follow up.   ?Specialty: General Surgery ?Why: Our office is scheduling you for post-operative follow up. Please call to confirm appointment date and time. ?Contact information: ?[address] ?[address] ?[place] [address] ?[phone] ? ? ?  ?  ? ?  ?  ? ?  ? ?Discharge Exam: ?Filed Weights  ? 06/05/21 0635  ?Weight: 73.2 kg  ? ?General-appears in no acute distress ?Heart-S1-S2, regular, no murmur auscultated ?Lungs-clear to auscultation bilaterally, no wheezing or crackles auscultated ?Abdomen-soft, nontender, no organomegaly ?Extremities-no edema in the lower extremities ?Neuro-alert, oriented x3, no focal deficit noted ? ?Condition at discharge: good ? ?The results of significant diagnostics from this hospitalization (including imaging, microbiology, ancillary and laboratory) are listed below for reference.  ? ?Imaging Studies: ?MR 3D Recon At Scanner ? ?Result Date: 06/04/2021 ?CLINICAL DATA:  Acute cholecystitis, jaundice, abdominal pain EXAM: MRI ABDOMEN WITHOUT AND WITH CONTRAST (INCLUDING MRCP) TECHNIQUE: Multiplanar multisequence MR imaging of the abdomen was performed both before and after the administration of intravenous contrast. Heavily T2-weighted images of the biliary and pancreatic ducts were obtained, and three-dimensional MRCP images were rendered by post processing. CONTRAST:  64m GADAVIST GADOBUTROL 1 MMOL/ML IV SOLN COMPARISON:  CT abdomen pelvis, 04/16/2020 FINDINGS: Lower chest: Trace bilateral pleural effusions. Hepatobiliary: No mass or other parenchymal abnormality identified. Somewhat coarse, nodular contour of the liver with heterogeneous parenchymal contrast enhancement. Numerous small gallstones in the dependent gallbladder. Gallbladder wall thickening and trace pericholecystic fluid. No biliary ductal dilatation or choledocholithiasis. No biliary ductal dilatation.  Pancreas: No mass, inflammatory changes, or other parenchymal abnormality identified.No pancreatic ductal dilatation. Spleen:  Within normal limits in size and appearance. Adrenals/Urinary Tract: Normal adrenal glands. No renal masses or suspicious contrast enhancement identified. No evidence of hydronephrosis. Stomach/Bowel: Visualized portions within the abdomen are unremarkable. Vascular/Lymphatic: No pathologically enlarged lymph nodes identified. No abdominal aortic aneurysm demonstrated. Other:  None. Musculoskeletal: No suspicious osseous lesions identified. IMPRESSION: 1. Cholelithiasis with gallbladder wall thickening and trace pericholecystic fluid. Findings are suspicious for acute cholecystitis. 2. No biliary ductal dilatation or choledocholithiasis. 3. Somewhat coarse, nodular contour of the liver with heterogeneous parenchymal contrast enhancement, suggesting cirrhosis. 4. Trace bilateral pleural effusions. Electronically Signed   By: ADelanna AhmadiM.D.   On: 06/04/2021 13:14  ? ?DG C-Arm 1-60 Min-No Report ? ?Result Date: 06/05/2021 ?Fluoroscopy was utilized by the requesting physician.  No radiographic interpretation.  ? ?MR ABDOMEN MRCP W WO CONTAST ? ?Result Date: 06/04/2021 ?CLINICAL DATA:  Acute cholecystitis, jaundice, abdominal pain EXAM: MRI ABDOMEN WITHOUT AND WITH CONTRAST (INCLUDING MRCP) TECHNIQUE: Multiplanar multisequence MR imaging of the abdomen was performed both before and after the administration of intravenous contrast. Heavily T2-weighted images of the biliary and pancreatic ducts were obtained, and three-dimensional MRCP images were rendered by post processing. CONTRAST:  757mGADAVIST GADOBUTROL 1 MMOL/ML IV SOLN COMPARISON:  CT abdomen pelvis, 04/16/2020 FINDINGS: Lower chest: Trace bilateral pleural effusions. Hepatobiliary: No mass or other parenchymal abnormality identified. Somewhat coarse, nodular contour of the liver with heterogeneous parenchymal contrast enhancement.  Numerous small gallstones in the dependent gallbladder. Gallbladder wall thickening and trace pericholecystic fluid. No biliary ductal dilatation or choledocholithiasis. No biliary ductal  dilatation. Pancreas: No mass, inflammatory changes, or

## 2021-06-06 NOTE — Progress Notes (Signed)
After administration of Dilaudid for pain, this RN was rounding on patient and to replace her fluids bag which was nearing empty. Patient was acting strange and very paranoid. After attempts to redirect and refocus her, she asked for a female nurse to take over her care. ?

## 2021-06-06 NOTE — Progress Notes (Signed)
Pt discharged to home with husband. Discharge instructions and medication education provided  ?

## 2021-06-09 LAB — SURGICAL PATHOLOGY

## 2021-06-25 ENCOUNTER — Inpatient Hospital Stay (HOSPITAL_BASED_OUTPATIENT_CLINIC_OR_DEPARTMENT_OTHER): Payer: Medicare Other | Admitting: Family

## 2021-06-25 ENCOUNTER — Other Ambulatory Visit: Payer: Self-pay

## 2021-06-25 ENCOUNTER — Inpatient Hospital Stay: Payer: Medicare Other

## 2021-06-25 ENCOUNTER — Encounter: Payer: Self-pay | Admitting: Family

## 2021-06-25 ENCOUNTER — Inpatient Hospital Stay: Payer: Medicare Other | Attending: Hematology & Oncology

## 2021-06-25 DIAGNOSIS — D751 Secondary polycythemia: Secondary | ICD-10-CM

## 2021-06-25 DIAGNOSIS — I82422 Acute embolism and thrombosis of left iliac vein: Secondary | ICD-10-CM

## 2021-06-25 DIAGNOSIS — R197 Diarrhea, unspecified: Secondary | ICD-10-CM | POA: Insufficient documentation

## 2021-06-25 DIAGNOSIS — E7211 Homocystinuria: Secondary | ICD-10-CM

## 2021-06-25 DIAGNOSIS — Z86718 Personal history of other venous thrombosis and embolism: Secondary | ICD-10-CM | POA: Diagnosis not present

## 2021-06-25 DIAGNOSIS — M810 Age-related osteoporosis without current pathological fracture: Secondary | ICD-10-CM | POA: Diagnosis not present

## 2021-06-25 DIAGNOSIS — Z8679 Personal history of other diseases of the circulatory system: Secondary | ICD-10-CM | POA: Diagnosis not present

## 2021-06-25 DIAGNOSIS — R5383 Other fatigue: Secondary | ICD-10-CM | POA: Insufficient documentation

## 2021-06-25 DIAGNOSIS — G713 Mitochondrial myopathy, not elsewhere classified: Secondary | ICD-10-CM | POA: Diagnosis not present

## 2021-06-25 DIAGNOSIS — R760 Raised antibody titer: Secondary | ICD-10-CM

## 2021-06-25 DIAGNOSIS — I2699 Other pulmonary embolism without acute cor pulmonale: Secondary | ICD-10-CM

## 2021-06-25 DIAGNOSIS — M545 Low back pain, unspecified: Secondary | ICD-10-CM | POA: Diagnosis not present

## 2021-06-25 DIAGNOSIS — Z7901 Long term (current) use of anticoagulants: Secondary | ICD-10-CM | POA: Insufficient documentation

## 2021-06-25 DIAGNOSIS — D45 Polycythemia vera: Secondary | ICD-10-CM | POA: Diagnosis not present

## 2021-06-25 LAB — CMP (CANCER CENTER ONLY)
ALT: 30 U/L (ref 0–44)
AST: 37 U/L (ref 15–41)
Albumin: 3.8 g/dL (ref 3.5–5.0)
Alkaline Phosphatase: 86 U/L (ref 38–126)
Anion gap: 10 (ref 5–15)
BUN: 11 mg/dL (ref 8–23)
CO2: 25 mmol/L (ref 22–32)
Calcium: 9.3 mg/dL (ref 8.9–10.3)
Chloride: 97 mmol/L — ABNORMAL LOW (ref 98–111)
Creatinine: 0.63 mg/dL (ref 0.44–1.00)
GFR, Estimated: >60 mL/min (ref >60)
Glucose, Bld: 104 mg/dL — ABNORMAL HIGH (ref 70–99)
Potassium: 4.1 mmol/L (ref 3.5–5.1)
Sodium: 132 mmol/L — ABNORMAL LOW (ref 135–145)
Total Bilirubin: 1.5 mg/dL — ABNORMAL HIGH (ref 0.3–1.2)
Total Protein: 7.1 g/dL (ref 6.5–8.1)

## 2021-06-25 LAB — CBC WITH DIFFERENTIAL (CANCER CENTER ONLY)
Abs Immature Granulocytes: 0.09 10*3/uL — ABNORMAL HIGH (ref 0.00–0.07)
Basophils Absolute: 0 10*3/uL (ref 0.0–0.1)
Basophils Relative: 1 %
Eosinophils Absolute: 0.1 10*3/uL (ref 0.0–0.5)
Eosinophils Relative: 2 %
HCT: 41.9 % (ref 36.0–46.0)
Hemoglobin: 14.2 g/dL (ref 12.0–15.0)
Immature Granulocytes: 2 %
Lymphocytes Relative: 27 %
Lymphs Abs: 1.6 10*3/uL (ref 0.7–4.0)
MCH: 32.2 pg (ref 26.0–34.0)
MCHC: 33.9 g/dL (ref 30.0–36.0)
MCV: 95 fL (ref 80.0–100.0)
Monocytes Absolute: 0.5 10*3/uL (ref 0.1–1.0)
Monocytes Relative: 9 %
Neutro Abs: 3.5 10*3/uL (ref 1.7–7.7)
Neutrophils Relative %: 59 %
Platelet Count: 142 10*3/uL — ABNORMAL LOW (ref 150–400)
RBC: 4.41 MIL/uL (ref 3.87–5.11)
RDW: 12.8 % (ref 11.5–15.5)
WBC Count: 5.9 10*3/uL (ref 4.0–10.5)
nRBC: 0 % (ref 0.0–0.2)

## 2021-06-25 LAB — RETICULOCYTES
Immature Retic Fract: 7.5 % (ref 2.3–15.9)
RBC.: 4.37 MIL/uL (ref 3.87–5.11)
Retic Count, Absolute: 86.5 10*3/uL (ref 19.0–186.0)
Retic Ct Pct: 2 % (ref 0.4–3.1)

## 2021-06-25 LAB — FERRITIN: Ferritin: 73 ng/mL (ref 11–307)

## 2021-06-25 NOTE — Progress Notes (Signed)
Hematology and Oncology Follow Up Visit  [name] [id number] [birth year] 76 y.o. 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 Hct is stable at 41.9%.  She was hospitalized in early may and ended up having a cholecystectomy.  She has recuperated nicely. She is still having loose stools daily.  No blood loss noted. No bruising or petechiae.  She has had some occasional fatigue and has noted lower back pain. She is wearing a back brace today.  No fever, chills, n/v, cough, rash, dizziness, SOB, chest pain, palpitations, abdominal pain or changes in bladder habits.  No swelling, numbness or tingling in her extremities.  No falls or syncope.  Appetite and hydration are good. Her weight is stable at 139 lbs.   ECOG Performance Status: 2 - Symptomatic, <50% confined to bed  Medications:  Allergies as of 06/25/2021       Reactions   Dulera [mometasone Furo-formoterol Fum] Cough        Medication List        Accurate as of Jun 25, 2021  1:38 PM. If you have any questions, ask your nurse or doctor.          albuterol 108 (90 Base) MCG/ACT inhaler Commonly known as: VENTOLIN HFA Inhale 2 puffs into the lungs every 4 (four) hours as needed for wheezing or shortness of breath.   folic acid 1 MG tablet Commonly known as: FOLVITE TAKE 1 TABLET(1 MG) BY MOUTH DAILY What changed: See the new instructions.   LORazepam 0.5 MG tablet Commonly  known as: ATIVAN TAKE 1 TABLET BY MOUTH EVERY NIGHT AT BEDTIME   ondansetron 4 MG disintegrating tablet Commonly known as: ZOFRAN-ODT Take 4 mg by mouth every 6 (six) hours as needed for nausea or vomiting.   oxyCODONE 5 MG immediate release tablet Commonly known as: Oxy IR/ROXICODONE Take 1 tablet (5 mg total) by mouth every 6 (six) hours as needed for moderate pain.   pantoprazole 40 MG tablet Commonly known as: PROTONIX Take 40 mg by mouth daily.   propranolol 20 MG tablet Commonly known as: INDERAL Take 20 mg by mouth daily.   Stiolto Respimat 2.5-2.5 MCG/ACT Aers Generic drug: Tiotropium Bromide-Olodaterol INHALE 2 PUFFS INTO THE LUNGS DAILY   Vitamin D (Ergocalciferol) 1.25 MG (50000 UNIT) Caps capsule Commonly known as: DRISDOL Take 1 capsule (50,000 Units total) by mouth every 7 (seven) days. Sundays   Xarelto 20 MG Tabs tablet Generic drug: rivaroxaban TAKE 1 TABLET(20 MG) BY MOUTH DAILY WITH SUPPER What changed: See the new instructions.        Allergies:  Allergies  Allergen Reactions   Dulera [Mometasone Furo-Formoterol Fum] Cough    Past Medical History, Surgical history, Social history, and Family History were reviewed and updated.  Review of Systems: All other 10 point review of systems is negative.   Physical Exam:  vitals were not taken for this visit.   Wt Readings from Last 3 Encounters:  06/05/21 161 lb 6 oz (73.2 kg)  04/25/21 140 lb 1.9 oz (63.6 kg)  12/24/20 137 lb 6.4 oz (62.3 kg)    Ocular: Sclerae unicteric, pupils equal, round and reactive to light Ear-nose-throat: Oropharynx clear, dentition fair Lymphatic: No cervical or supraclavicular adenopathy Lungs no rales or rhonchi, good excursion bilaterally Heart regular rate and rhythm, no murmur appreciated Abd soft, nontender, positive bowel sounds MSK no focal spinal tenderness, no joint edema Neuro: non-focal, well-oriented, appropriate affect Breasts: Deferred   Lab Results   Component Value Date   WBC 5.9 06/25/2021   HGB 14.2 06/25/2021   HCT 41.9 06/25/2021   MCV 95.0 06/25/2021   PLT 142 (L) 06/25/2021   Lab Results  Component Value Date   FERRITIN 24 04/25/2021   IRON 106 04/25/2021   TIBC 412 04/25/2021   UIBC 306 04/25/2021   IRONPCTSAT 26 04/25/2021   Lab Results  Component Value Date   RETICCTPCT 2.0 06/25/2021   RBC 4.37 06/25/2021   RETICCTABS 65.9 06/12/2014   Lab Results  Component Value Date   KAPLAMBRATIO 0.91 04/27/2016   Lab Results  Component Value Date   IGGSERUM 897 04/27/2016   IGA 339 02/27/2014   IGMSERUM 132 04/27/2016   Lab Results  Component Value Date   MSPIKE Not Observed 04/27/2016     Chemistry      Component Value Date/Time   NA 129 (L) 06/06/2021 0454   NA 144 01/20/2017 1344   NA 138 01/17/2016 1356   K 4.6 06/06/2021 0454   K 3.6 01/20/2017 1344   K 4.1 01/17/2016 1356   CL 102 06/06/2021 0454   CL 107 01/20/2017 1344   CO2 17 (L) 06/06/2021 0454   CO2 25 01/20/2017 1344   CO2 25 01/17/2016 1356   BUN 9 06/06/2021 0454   BUN 12 01/20/2017 1344   BUN 12.3 01/17/2016 1356   CREATININE 0.54 06/06/2021 0454   CREATININE 0.65 04/25/2021 1253   CREATININE 0.7 01/20/2017 1344   CREATININE 0.8 01/17/2016 1356      Component Value Date/Time   CALCIUM 7.2 (L) 06/06/2021 0454   CALCIUM 8.9 01/20/2017 1344   CALCIUM 9.1 01/17/2016 1356   ALKPHOS 154 (H) 06/06/2021 0454   ALKPHOS 68 01/20/2017 1344   ALKPHOS 87 01/17/2016 1356   AST 102 (H) 06/06/2021 0454   AST 33 04/25/2021 1253   AST 48 (H) 01/17/2016 1356   ALT 145 (H) 06/06/2021 0454   ALT 23 04/25/2021 1253   ALT 49 (H) 01/20/2017 1344   ALT 38 01/17/2016 1356   BILITOT 2.9 (H) 06/06/2021 0454   BILITOT 1.0 04/25/2021 1253   BILITOT 0.67 01/17/2016 1356       Impression and Plan: [name] is a very pleasant 76 yo caucasian female with polycythemia, 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 She was  diagnosed with extensive left lower extremity DVT and small right PE in February 2021 (resolved on CT angio and [place] in June 2021). She has had a mildly elevated homocystine level and positive lupus anticoagulant. She is on lifelong anticoagulant therapy.  No phlebotomy needed this visit, Hct 41.9%.  Iron studies are pending.  Follow-up in 2 months.   Lottie Dawson, NP 5/31/20231:38 PM

## 2021-06-26 LAB — IRON AND IRON BINDING CAPACITY (CC-WL,HP ONLY)
Iron: 159 ug/dL (ref 28–170)
Saturation Ratios: 50 % — ABNORMAL HIGH (ref 10.4–31.8)
TIBC: 319 ug/dL (ref 250–450)
UIBC: 160 ug/dL (ref 148–442)

## 2021-06-26 LAB — HOMOCYSTEINE: Homocysteine: 13.8 umol/L (ref 0.0–19.2)

## 2021-06-30 DIAGNOSIS — M542 Cervicalgia: Secondary | ICD-10-CM | POA: Diagnosis not present

## 2021-07-07 DIAGNOSIS — M542 Cervicalgia: Secondary | ICD-10-CM | POA: Diagnosis not present

## 2021-07-21 DIAGNOSIS — M542 Cervicalgia: Secondary | ICD-10-CM | POA: Diagnosis not present

## 2021-08-07 DIAGNOSIS — H43813 Vitreous degeneration, bilateral: Secondary | ICD-10-CM | POA: Diagnosis not present

## 2021-08-07 DIAGNOSIS — H0011 Chalazion right upper eyelid: Secondary | ICD-10-CM | POA: Diagnosis not present

## 2021-08-07 DIAGNOSIS — D3132 Benign neoplasm of left choroid: Secondary | ICD-10-CM | POA: Diagnosis not present

## 2021-08-18 ENCOUNTER — Inpatient Hospital Stay (HOSPITAL_BASED_OUTPATIENT_CLINIC_OR_DEPARTMENT_OTHER): Payer: Medicare Other | Admitting: Family

## 2021-08-18 ENCOUNTER — Encounter: Payer: Self-pay | Admitting: Family

## 2021-08-18 ENCOUNTER — Inpatient Hospital Stay: Payer: Medicare Other | Attending: Hematology & Oncology

## 2021-08-18 ENCOUNTER — Inpatient Hospital Stay: Payer: Medicare Other

## 2021-08-18 DIAGNOSIS — D45 Polycythemia vera: Secondary | ICD-10-CM | POA: Diagnosis not present

## 2021-08-18 DIAGNOSIS — R760 Raised antibody titer: Secondary | ICD-10-CM

## 2021-08-18 DIAGNOSIS — E7211 Homocystinuria: Secondary | ICD-10-CM | POA: Diagnosis not present

## 2021-08-18 DIAGNOSIS — I82422 Acute embolism and thrombosis of left iliac vein: Secondary | ICD-10-CM

## 2021-08-18 DIAGNOSIS — I2699 Other pulmonary embolism without acute cor pulmonale: Secondary | ICD-10-CM

## 2021-08-18 DIAGNOSIS — Z86718 Personal history of other venous thrombosis and embolism: Secondary | ICD-10-CM | POA: Insufficient documentation

## 2021-08-18 DIAGNOSIS — D751 Secondary polycythemia: Secondary | ICD-10-CM | POA: Diagnosis not present

## 2021-08-18 DIAGNOSIS — Z7901 Long term (current) use of anticoagulants: Secondary | ICD-10-CM | POA: Insufficient documentation

## 2021-08-18 DIAGNOSIS — D508 Other iron deficiency anemias: Secondary | ICD-10-CM

## 2021-08-18 DIAGNOSIS — M810 Age-related osteoporosis without current pathological fracture: Secondary | ICD-10-CM | POA: Diagnosis not present

## 2021-08-18 DIAGNOSIS — Z8679 Personal history of other diseases of the circulatory system: Secondary | ICD-10-CM

## 2021-08-18 DIAGNOSIS — D510 Vitamin B12 deficiency anemia due to intrinsic factor deficiency: Secondary | ICD-10-CM

## 2021-08-18 DIAGNOSIS — M8008XG Age-related osteoporosis with current pathological fracture, vertebra(e), subsequent encounter for fracture with delayed healing: Secondary | ICD-10-CM

## 2021-08-18 LAB — CMP (CANCER CENTER ONLY)
ALT: 27 U/L (ref 0–44)
AST: 31 U/L (ref 15–41)
Albumin: 4 g/dL (ref 3.5–5.0)
Alkaline Phosphatase: 81 U/L (ref 38–126)
Anion gap: 8 (ref 5–15)
BUN: 10 mg/dL (ref 8–23)
CO2: 27 mmol/L (ref 22–32)
Calcium: 9.5 mg/dL (ref 8.9–10.3)
Chloride: 98 mmol/L (ref 98–111)
Creatinine: 0.64 mg/dL (ref 0.44–1.00)
GFR, Estimated: >60 mL/min (ref >60)
Glucose, Bld: 102 mg/dL — ABNORMAL HIGH (ref 70–99)
Potassium: 3.8 mmol/L (ref 3.5–5.1)
Sodium: 133 mmol/L — ABNORMAL LOW (ref 135–145)
Total Bilirubin: 1.4 mg/dL — ABNORMAL HIGH (ref 0.3–1.2)
Total Protein: 6.8 g/dL (ref 6.5–8.1)

## 2021-08-18 LAB — CBC WITH DIFFERENTIAL (CANCER CENTER ONLY)
Abs Immature Granulocytes: 0.02 10*3/uL (ref 0.00–0.07)
Basophils Absolute: 0 10*3/uL (ref 0.0–0.1)
Basophils Relative: 1 %
Eosinophils Absolute: 0.1 10*3/uL (ref 0.0–0.5)
Eosinophils Relative: 2 %
HCT: 43.4 % (ref 36.0–46.0)
Hemoglobin: 15 g/dL (ref 12.0–15.0)
Immature Granulocytes: 0 %
Lymphocytes Relative: 24 %
Lymphs Abs: 1.3 10*3/uL (ref 0.7–4.0)
MCH: 33.5 pg (ref 26.0–34.0)
MCHC: 34.6 g/dL (ref 30.0–36.0)
MCV: 96.9 fL (ref 80.0–100.0)
Monocytes Absolute: 0.6 10*3/uL (ref 0.1–1.0)
Monocytes Relative: 11 %
Neutro Abs: 3.6 10*3/uL (ref 1.7–7.7)
Neutrophils Relative %: 62 %
Platelet Count: 127 10*3/uL — ABNORMAL LOW (ref 150–400)
RBC: 4.48 MIL/uL (ref 3.87–5.11)
RDW: 13.1 % (ref 11.5–15.5)
WBC Count: 5.7 10*3/uL (ref 4.0–10.5)
nRBC: 0 % (ref 0.0–0.2)

## 2021-08-18 LAB — FERRITIN: Ferritin: 27 ng/mL (ref 11–307)

## 2021-08-18 LAB — LACTATE DEHYDROGENASE: LDH: 142 U/L (ref 98–192)

## 2021-08-18 MED ORDER — SODIUM CHLORIDE 0.9 % IV SOLN: INTRAVENOUS | Status: DC

## 2021-08-18 MED ORDER — ZOLEDRONIC ACID 4 MG/100ML IV SOLN
4.0000 mg | Freq: Once | INTRAVENOUS | Status: AC
  Administered 2021-08-18: 4 mg via INTRAVENOUS
  Filled 2021-08-18: qty 100

## 2021-08-18 NOTE — Patient Instructions (Signed)

## 2021-08-18 NOTE — Progress Notes (Signed)
Hematology and Oncology Follow Up Visit  [name] [id number] [birth year] 76 y.o. 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 Hct is stable at 43%. No phlebotomy today.  She has mild fatigue at times with over exertion and chronic back pain unchanged from baseline.  No fever, chills, n/v, cough, rash, dizziness, SOB, chest pain, palpitations, abdominal pain or changes in bowel or bladder habits.  No swelling, numbness or tingling in her extremities at this time.  No falls or syncope to report.  Appetite and hydration have been good. Weight is stable at 140 lbs.   ECOG Performance Status: 1 - Symptomatic but completely ambulatory  Medications:  Allergies as of 08/18/2021       Reactions   Dulera [mometasone Furo-formoterol Fum] Cough        Medication List        Accurate as of August 18, 2021  2:17 PM. If you have any questions, ask your nurse or doctor.          albuterol 108 (90 Base) MCG/ACT inhaler Commonly known as: VENTOLIN HFA Inhale 2 puffs into the lungs every 4 (four) hours as needed for wheezing or shortness of breath.   folic acid 1 MG tablet Commonly known as: FOLVITE TAKE 1 TABLET(1 MG) BY MOUTH DAILY What changed: See the new instructions.   LORazepam 0.5 MG tablet Commonly known as: ATIVAN TAKE 1 TABLET BY MOUTH EVERY NIGHT AT BEDTIME   pantoprazole 40 MG tablet Commonly known as: PROTONIX Take 40 mg by mouth  daily.   propranolol 20 MG tablet Commonly known as: INDERAL Take 20 mg by mouth daily.   Stiolto Respimat 2.5-2.5 MCG/ACT Aers Generic drug: Tiotropium Bromide-Olodaterol INHALE 2 PUFFS INTO THE LUNGS DAILY   Vitamin D (Ergocalciferol) 1.25 MG (50000 UNIT) Caps capsule Commonly known as: DRISDOL Take 1 capsule (50,000 Units total) by mouth every 7 (seven) days. Sundays   Xarelto 20 MG Tabs tablet Generic drug: rivaroxaban TAKE 1 TABLET(20 MG) BY MOUTH DAILY WITH SUPPER What changed: See the new instructions.        Allergies:  Allergies  Allergen Reactions   Dulera [Mometasone Furo-Formoterol Fum] Cough    Past Medical History, Surgical history, Social history, and Family History were reviewed and updated.  Review of Systems: All other 10 point review of systems is negative.   Physical Exam:  weight is 140 lb 12.8 oz (63.9 kg). Her oral temperature is 97.6 F (36.4 C). Her blood pressure is 136/70 and her pulse is 72. Her respiration is 18 and oxygen saturation is 100%.   Wt Readings from Last 3 Encounters:  08/18/21 140 lb 12.8 oz (63.9 kg)  06/25/21 139 lb (63 kg)  06/05/21 161 lb 6 oz (73.2 kg)    Ocular: Sclerae unicteric, pupils equal, round and reactive to light Ear-nose-throat: Oropharynx clear, dentition fair Lymphatic: No cervical or supraclavicular adenopathy Lungs no rales or rhonchi, good excursion bilaterally Heart regular  rate and rhythm, no murmur appreciated Abd soft, nontender, positive bowel sounds MSK no focal spinal tenderness, no joint edema Neuro: non-focal, well-oriented, appropriate affect Breasts:   Lab Results  Component Value Date   WBC 5.7 08/18/2021   HGB 15.0 08/18/2021   HCT 43.4 08/18/2021   MCV 96.9 08/18/2021   PLT 127 (L) 08/18/2021   Lab Results  Component Value Date   FERRITIN 73 06/25/2021   IRON 159 06/25/2021   TIBC 319 06/25/2021   UIBC 160 06/25/2021   IRONPCTSAT 50 (H) 06/25/2021   Lab Results   Component Value Date   RETICCTPCT 2.0 06/25/2021   RBC 4.48 08/18/2021   RETICCTABS 65.9 06/12/2014   Lab Results  Component Value Date   KAPLAMBRATIO 0.91 04/27/2016   Lab Results  Component Value Date   IGGSERUM 897 04/27/2016   IGA 339 02/27/2014   IGMSERUM 132 04/27/2016   Lab Results  Component Value Date   MSPIKE Not Observed 04/27/2016     Chemistry      Component Value Date/Time   NA 132 (L) 06/25/2021 1309   NA 144 01/20/2017 1344   NA 138 01/17/2016 1356   K 4.1 06/25/2021 1309   K 3.6 01/20/2017 1344   K 4.1 01/17/2016 1356   CL 97 (L) 06/25/2021 1309   CL 107 01/20/2017 1344   CO2 25 06/25/2021 1309   CO2 25 01/20/2017 1344   CO2 25 01/17/2016 1356   BUN 11 06/25/2021 1309   BUN 12 01/20/2017 1344   BUN 12.3 01/17/2016 1356   CREATININE 0.63 06/25/2021 1309   CREATININE 0.7 01/20/2017 1344   CREATININE 0.8 01/17/2016 1356      Component Value Date/Time   CALCIUM 9.3 06/25/2021 1309   CALCIUM 8.9 01/20/2017 1344   CALCIUM 9.1 01/17/2016 1356   ALKPHOS 86 06/25/2021 1309   ALKPHOS 68 01/20/2017 1344   ALKPHOS 87 01/17/2016 1356   AST 37 06/25/2021 1309   AST 48 (H) 01/17/2016 1356   ALT 30 06/25/2021 1309   ALT 49 (H) 01/20/2017 1344   ALT 38 01/17/2016 1356   BILITOT 1.5 (H) 06/25/2021 1309   BILITOT 0.67 01/17/2016 1356       Impression and Plan: [name] is a very pleasant 76 yo caucasian female with polycythemia, 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 She was diagnosed with extensive left lower extremity DVT and small right PE in February 2021 (resolved on CT angio and [place] in June 2021). She has had a mildly elevated homocystine level and positive lupus anticoagulant. She is on lifelong anticoagulant therapy.  No phlebotomy needed this visit, Hct 43.4%.  Zometa given as planned.  Iron studies pending.  Follow-up in 2 months.   Lottie Dawson, NP 7/24/20232:17 PM

## 2021-08-19 LAB — IRON AND IRON BINDING CAPACITY (CC-WL,HP ONLY)
Iron: 186 ug/dL — ABNORMAL HIGH (ref 28–170)
Saturation Ratios: 54 % — ABNORMAL HIGH (ref 10.4–31.8)
TIBC: 343 ug/dL (ref 250–450)
UIBC: 157 ug/dL (ref 148–442)

## 2021-08-19 LAB — HOMOCYSTEINE: Homocysteine: 14.5 umol/L (ref 0.0–19.2)

## 2021-08-25 ENCOUNTER — Other Ambulatory Visit: Payer: Medicare Other

## 2021-08-25 ENCOUNTER — Ambulatory Visit: Payer: Medicare Other | Admitting: Family

## 2021-09-08 ENCOUNTER — Other Ambulatory Visit: Payer: Self-pay | Admitting: Hematology & Oncology

## 2021-09-08 DIAGNOSIS — I2699 Other pulmonary embolism without acute cor pulmonale: Secondary | ICD-10-CM

## 2021-09-08 DIAGNOSIS — I82412 Acute embolism and thrombosis of left femoral vein: Secondary | ICD-10-CM

## 2021-10-16 DIAGNOSIS — M47816 Spondylosis without myelopathy or radiculopathy, lumbar region: Secondary | ICD-10-CM | POA: Diagnosis not present

## 2021-10-21 ENCOUNTER — Inpatient Hospital Stay: Payer: Medicare Other | Attending: Hematology & Oncology

## 2021-10-21 ENCOUNTER — Other Ambulatory Visit (HOSPITAL_BASED_OUTPATIENT_CLINIC_OR_DEPARTMENT_OTHER): Payer: Self-pay

## 2021-10-21 ENCOUNTER — Inpatient Hospital Stay: Payer: Medicare Other

## 2021-10-21 ENCOUNTER — Encounter: Payer: Self-pay | Admitting: Family

## 2021-10-21 ENCOUNTER — Inpatient Hospital Stay (HOSPITAL_BASED_OUTPATIENT_CLINIC_OR_DEPARTMENT_OTHER): Payer: Medicare Other | Admitting: Family

## 2021-10-21 DIAGNOSIS — E7211 Homocystinuria: Secondary | ICD-10-CM

## 2021-10-21 DIAGNOSIS — I82422 Acute embolism and thrombosis of left iliac vein: Secondary | ICD-10-CM | POA: Diagnosis not present

## 2021-10-21 DIAGNOSIS — D751 Secondary polycythemia: Secondary | ICD-10-CM

## 2021-10-21 DIAGNOSIS — Z7901 Long term (current) use of anticoagulants: Secondary | ICD-10-CM | POA: Diagnosis not present

## 2021-10-21 DIAGNOSIS — D45 Polycythemia vera: Secondary | ICD-10-CM | POA: Insufficient documentation

## 2021-10-21 DIAGNOSIS — R760 Raised antibody titer: Secondary | ICD-10-CM | POA: Diagnosis not present

## 2021-10-21 DIAGNOSIS — I2699 Other pulmonary embolism without acute cor pulmonale: Secondary | ICD-10-CM | POA: Diagnosis not present

## 2021-10-21 DIAGNOSIS — Z86718 Personal history of other venous thrombosis and embolism: Secondary | ICD-10-CM | POA: Diagnosis not present

## 2021-10-21 DIAGNOSIS — Z8759 Personal history of other complications of pregnancy, childbirth and the puerperium: Secondary | ICD-10-CM | POA: Diagnosis not present

## 2021-10-21 DIAGNOSIS — Z23 Encounter for immunization: Secondary | ICD-10-CM | POA: Diagnosis not present

## 2021-10-21 LAB — CMP (CANCER CENTER ONLY)
ALT: 30 U/L (ref 0–44)
AST: 37 U/L (ref 15–41)
Albumin: 3.9 g/dL (ref 3.5–5.0)
Alkaline Phosphatase: 84 U/L (ref 38–126)
Anion gap: 10 (ref 5–15)
BUN: 11 mg/dL (ref 8–23)
CO2: 25 mmol/L (ref 22–32)
Calcium: 9.6 mg/dL (ref 8.9–10.3)
Chloride: 100 mmol/L (ref 98–111)
Creatinine: 0.63 mg/dL (ref 0.44–1.00)
GFR, Estimated: >60 mL/min (ref >60)
Glucose, Bld: 101 mg/dL — ABNORMAL HIGH (ref 70–99)
Potassium: 3.8 mmol/L (ref 3.5–5.1)
Sodium: 135 mmol/L (ref 135–145)
Total Bilirubin: 1.2 mg/dL (ref 0.3–1.2)
Total Protein: 7.1 g/dL (ref 6.5–8.1)

## 2021-10-21 LAB — CBC WITH DIFFERENTIAL (CANCER CENTER ONLY)
Abs Immature Granulocytes: 0.03 10*3/uL (ref 0.00–0.07)
Basophils Absolute: 0 10*3/uL (ref 0.0–0.1)
Basophils Relative: 1 %
Eosinophils Absolute: 0.1 10*3/uL (ref 0.0–0.5)
Eosinophils Relative: 2 %
HCT: 44.9 % (ref 36.0–46.0)
Hemoglobin: 15.5 g/dL — ABNORMAL HIGH (ref 12.0–15.0)
Immature Granulocytes: 1 %
Lymphocytes Relative: 25 %
Lymphs Abs: 1.5 10*3/uL (ref 0.7–4.0)
MCH: 34 pg (ref 26.0–34.0)
MCHC: 34.5 g/dL (ref 30.0–36.0)
MCV: 98.5 fL (ref 80.0–100.0)
Monocytes Absolute: 0.5 10*3/uL (ref 0.1–1.0)
Monocytes Relative: 9 %
Neutro Abs: 3.7 10*3/uL (ref 1.7–7.7)
Neutrophils Relative %: 62 %
Platelet Count: 129 10*3/uL — ABNORMAL LOW (ref 150–400)
RBC: 4.56 MIL/uL (ref 3.87–5.11)
RDW: 12.1 % (ref 11.5–15.5)
WBC Count: 5.8 10*3/uL (ref 4.0–10.5)
nRBC: 0 % (ref 0.0–0.2)

## 2021-10-21 LAB — FERRITIN: Ferritin: 37 ng/mL (ref 11–307)

## 2021-10-21 LAB — LACTATE DEHYDROGENASE: LDH: 146 U/L (ref 98–192)

## 2021-10-21 MED ORDER — FLUAD QUADRIVALENT 0.5 ML IM PRSY
PREFILLED_SYRINGE | INTRAMUSCULAR | 0 refills | Status: DC
  Filled 2021-10-21: qty 0.5, 1d supply, fill #0

## 2021-10-21 NOTE — Progress Notes (Signed)
Hematology and Oncology Follow Up Visit  [name] [id number] 1945/08/15 76 y.o. 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 She is doing well but still having issues with her back. She states that she is scheduled for an MRI tomorrow and will be seeing neurosurgery in 2 weeks for further eval and treatment.  No fever, chills, n/v, cough, rash, dizziness, SOB, chest pain, palpitations, abdominal pain or changes in bowel or bladder habits.  No issue with blood loss, abnormal bruising or petechiae on Xarelto.  No swelling, numbness or tingling in her extremities.  No falls or syncope.  She ambulates with a cane for added support.  Appetite and hydration have remained good. Weight is stable at 141 lbs.   ECOG Performance Status: 2 - Symptomatic, <50% confined to bed  Medications:  Allergies as of 10/21/2021       Reactions   Dulera [mometasone Furo-formoterol Fum] Cough        Medication List        Accurate as of October 21, 2021  1:57 PM. If you have any questions, ask your nurse or doctor.          albuterol 108 (90 Base) MCG/ACT inhaler Commonly known as: VENTOLIN HFA Inhale 2 puffs into the lungs every 4 (four) hours as needed for wheezing or shortness of breath.   Fluad Quadrivalent 0.5 ML injection Generic drug: influenza vaccine adjuvanted Inject into the muscle.   folic acid 1 MG  tablet Commonly known as: FOLVITE TAKE 1 TABLET(1 MG) BY MOUTH DAILY What changed: See the new instructions.   LORazepam 0.5 MG tablet Commonly known as: ATIVAN TAKE 1 TABLET BY MOUTH EVERY NIGHT AT BEDTIME   pantoprazole 40 MG tablet Commonly known as: PROTONIX Take 40 mg by mouth daily.   propranolol 20 MG tablet Commonly known as: INDERAL Take 20 mg by mouth daily.   Stiolto Respimat 2.5-2.5 MCG/ACT Aers Generic drug: Tiotropium Bromide-Olodaterol INHALE 2 PUFFS INTO THE LUNGS DAILY   Vitamin D (Ergocalciferol) 1.25 MG (50000 UNIT) Caps capsule Commonly known as: DRISDOL Take 1 capsule (50,000 Units total) by mouth every 7 (seven) days. Sundays   Xarelto 20 MG Tabs tablet Generic drug: rivaroxaban TAKE 1 TABLET(20 MG) BY MOUTH DAILY WITH SUPPER        Allergies:  Allergies  Allergen Reactions   Dulera [Mometasone Furo-Formoterol Fum] Cough    Past Medical History, Surgical history, Social history, and Family History were reviewed and updated.  Review of Systems: All other 10 point review of systems is negative.   Physical Exam:  weight is 141 lb 1.9 oz (64 kg). Her oral temperature is 98.3 F (36.8 C). Her blood pressure is 142/68 (abnormal) and her pulse is 77. Her respiration is 17 and oxygen saturation is 98%.   Wt Readings from Last 3 Encounters:  10/21/21 141 lb 1.9 oz (64 kg)  08/18/21 140 lb 12.8 oz (  63.9 kg)  06/25/21 139 lb (63 kg)    Ocular: Sclerae unicteric, pupils equal, round and reactive to light Ear-nose-throat: Oropharynx clear, dentition fair Lymphatic: No cervical or supraclavicular adenopathy Lungs no rales or rhonchi, good excursion bilaterally Heart regular rate and rhythm, no murmur appreciated Abd soft, nontender, positive bowel sounds MSK no focal spinal tenderness, no joint edema Neuro: non-focal, well-oriented, appropriate affect Breasts: Deferred   Lab Results  Component Value Date   WBC 5.8 10/21/2021   HGB 15.5 (H)  10/21/2021   HCT 44.9 10/21/2021   MCV 98.5 10/21/2021   PLT 129 (L) 10/21/2021   Lab Results  Component Value Date   FERRITIN 27 08/18/2021   IRON 186 (H) 08/18/2021   TIBC 343 08/18/2021   UIBC 157 08/18/2021   IRONPCTSAT 54 (H) 08/18/2021   Lab Results  Component Value Date   RETICCTPCT 2.0 06/25/2021   RBC 4.56 10/21/2021   RETICCTABS 65.9 06/12/2014   Lab Results  Component Value Date   KAPLAMBRATIO 0.91 04/27/2016   Lab Results  Component Value Date   IGGSERUM 897 04/27/2016   IGA 339 02/27/2014   IGMSERUM 132 04/27/2016   Lab Results  Component Value Date   MSPIKE Not Observed 04/27/2016     Chemistry      Component Value Date/Time   NA 133 (L) 08/18/2021 1346   NA 144 01/20/2017 1344   NA 138 01/17/2016 1356   K 3.8 08/18/2021 1346   K 3.6 01/20/2017 1344   K 4.1 01/17/2016 1356   CL 98 08/18/2021 1346   CL 107 01/20/2017 1344   CO2 27 08/18/2021 1346   CO2 25 01/20/2017 1344   CO2 25 01/17/2016 1356   BUN 10 08/18/2021 1346   BUN 12 01/20/2017 1344   BUN 12.3 01/17/2016 1356   CREATININE 0.64 08/18/2021 1346   CREATININE 0.7 01/20/2017 1344   CREATININE 0.8 01/17/2016 1356      Component Value Date/Time   CALCIUM 9.5 08/18/2021 1346   CALCIUM 8.9 01/20/2017 1344   CALCIUM 9.1 01/17/2016 1356   ALKPHOS 81 08/18/2021 1346   ALKPHOS 68 01/20/2017 1344   ALKPHOS 87 01/17/2016 1356   AST 31 08/18/2021 1346   AST 48 (H) 01/17/2016 1356   ALT 27 08/18/2021 1346   ALT 49 (H) 01/20/2017 1344   ALT 38 01/17/2016 1356   BILITOT 1.4 (H) 08/18/2021 1346   BILITOT 0.67 01/17/2016 1356       Impression and Plan: [name] is a very pleasant 76 yo caucasian female with polycythemia, 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 She was diagnosed with extensive left lower extremity DVT and small right PE in February 2021 (resolved on CT angio and [place] in June 2021). She has had a mildly elevated homocystine level and positive  lupus anticoagulant. She is on lifelong anticoagulant therapy.  No phlebotomy today, Hct 44.9%. Patient prefers to wait.  Iron studies pending.  Follow-up in 2 months.   Lottie Dawson, NP 9/26/20231:57 PM

## 2021-10-22 DIAGNOSIS — M47816 Spondylosis without myelopathy or radiculopathy, lumbar region: Secondary | ICD-10-CM | POA: Diagnosis not present

## 2021-10-22 DIAGNOSIS — S32020A Wedge compression fracture of second lumbar vertebra, initial encounter for closed fracture: Secondary | ICD-10-CM | POA: Diagnosis not present

## 2021-10-22 LAB — IRON AND IRON BINDING CAPACITY (CC-WL,HP ONLY)
Iron: 112 ug/dL (ref 28–170)
Saturation Ratios: 34 % — ABNORMAL HIGH (ref 10.4–31.8)
TIBC: 332 ug/dL (ref 250–450)
UIBC: 220 ug/dL (ref 148–442)

## 2021-10-22 LAB — HOMOCYSTEINE: Homocysteine: 15.2 umol/L (ref 0.0–19.2)

## 2021-10-23 ENCOUNTER — Telehealth: Payer: Self-pay | Admitting: *Deleted

## 2021-10-23 NOTE — Telephone Encounter (Signed)
Per 10/21/21 - No LOS

## 2021-10-31 DIAGNOSIS — Z23 Encounter for immunization: Secondary | ICD-10-CM | POA: Diagnosis not present

## 2021-11-06 DIAGNOSIS — M47816 Spondylosis without myelopathy or radiculopathy, lumbar region: Secondary | ICD-10-CM | POA: Diagnosis not present

## 2021-11-08 ENCOUNTER — Other Ambulatory Visit: Payer: Self-pay | Admitting: Nurse Practitioner

## 2021-11-14 ENCOUNTER — Other Ambulatory Visit: Payer: Self-pay | Admitting: Hematology & Oncology

## 2021-11-14 DIAGNOSIS — R7983 Abnormal findings of blood amino-acid level: Secondary | ICD-10-CM

## 2021-11-18 ENCOUNTER — Other Ambulatory Visit: Payer: Self-pay | Admitting: Emergency Medicine

## 2021-11-18 DIAGNOSIS — M47816 Spondylosis without myelopathy or radiculopathy, lumbar region: Secondary | ICD-10-CM | POA: Diagnosis not present

## 2021-11-19 ENCOUNTER — Other Ambulatory Visit: Payer: Self-pay | Admitting: Emergency Medicine

## 2021-12-01 ENCOUNTER — Telehealth: Payer: Self-pay | Admitting: Primary Care

## 2021-12-01 ENCOUNTER — Ambulatory Visit (INDEPENDENT_AMBULATORY_CARE_PROVIDER_SITE_OTHER): Payer: Medicare Other | Admitting: Primary Care

## 2021-12-01 ENCOUNTER — Encounter: Payer: Self-pay | Admitting: Primary Care

## 2021-12-01 VITALS — BP 122/62 | HR 78 | Temp 97.8°F | Ht 63.0 in | Wt 140.0 lb

## 2021-12-01 DIAGNOSIS — K219 Gastro-esophageal reflux disease without esophagitis: Secondary | ICD-10-CM | POA: Diagnosis not present

## 2021-12-01 DIAGNOSIS — J449 Chronic obstructive pulmonary disease, unspecified: Secondary | ICD-10-CM | POA: Diagnosis not present

## 2021-12-01 DIAGNOSIS — Z86711 Personal history of pulmonary embolism: Secondary | ICD-10-CM

## 2021-12-01 DIAGNOSIS — N390 Urinary tract infection, site not specified: Secondary | ICD-10-CM | POA: Diagnosis not present

## 2021-12-01 MED ORDER — STIOLTO RESPIMAT 2.5-2.5 MCG/ACT IN AERS: 2.0000 | INHALATION_SPRAY | Freq: Every day | RESPIRATORY_TRACT | 11 refills | Status: DC

## 2021-12-01 NOTE — Telephone Encounter (Signed)
What's the incidence rate of osteoporosis with PPI? Patient is on protonix '40mg'$  daily. She has hx osteoporosis. She takes folic acid and vit D. She received infusion twice a year with Dr. Marin Olp- I believe it is Zometa.

## 2021-12-01 NOTE — Progress Notes (Signed)
$'@Patient'd$  ID: [name], female    DOB: [birth year], 76 y.o.   MRN: [id number]  Chief Complaint  Patient presents with   Follow-up    Referring provider: Velna Hatchet, MD  HPI: 76 year old female, never smoked.  Past medical history significant for severe COPD with reversibility, history of DVT and PE, polycythemia vera with hemochromatosis.  Patient of Dr. Lamonte Sakai, last seen on 11/01/2018.  Previous [facility] pulmonary encounter: ROV 10/31/20 --76 year old woman with severe COPD and a positive bronchodilator response, history of DVT and bilateral PE, polycythemia vera with hemochromatosis.  She also has chronic cough with contribution from GERD and rhinitis.  It improved when she increased her PPI.  At our last visit I changed her Symbicort to Stiolto to see if she would get more benefit.  She remains on anticoagulation. The addition of LAMA did seem to help her breathing. She is not having increased mucous. She is on pantoprazole bid. She has a couple coughing spells at night, better w chewing gum. Not having a lot of nasal congestion.    12/01/2021 Patient presents today for 1 year follow-up/ severe COPD. Symbicort changed to Stiolto back in 2022. She feels her breathing is significantly better on LABA/LAMA combination. She has a chronic cough which is well controlled on protonix. She has a hx for osteoporosis and is concerned about being on PPI. Maintained on oral anticoagulation for hx DVT/PE.    Allergies  Allergen Reactions   Dulera [Mometasone Furo-Formoterol Fum] Cough    Immunization History  Administered Date(s) Administered   Fluad Quad(high Dose 65+) 10/21/2021   Influenza Split 09/30/2012   Influenza Whole 11/11/2007   Influenza, High Dose Seasonal PF 10/20/2016   Influenza,inj,Quad PF,6+ Mos 11/07/2013, 10/23/2014, 10/25/2015, 11/09/2017, 10/11/2018   Moderna SARS-COV2 Booster Vaccination 05/02/2020   Moderna Sars-Covid-2 Vaccination 03/13/2019, 04/10/2019,  09/10/2019   Pneumococcal Polysaccharide-23 03/01/2013   Td 11/04/2004   Zoster Recombinat (Shingrix) 01/21/2017    Past Medical History:  Diagnosis Date   Allergy    Anemia    Anxiety    Asthma    DR. BYRUM   Blood transfusion without reported diagnosis    Cataract    Cholelithiasis    Chronic kidney disease    gallstones    Compression fracture of body of thoracic vertebra (HCC)    Depression    Dyspnea    Dysrhythmia    palpitations   GERD (gastroesophageal reflux disease)    Heart attack ([place])    Hemorrhoids    IBS (irritable bowel syndrome)    Iliotibial band syndrome    left knee   Migraine    Mitochondrial myopathy    Normal coronary arteries    by cardiac catheterization 2003   Osteoporosis    Pericarditis    age 54   Pneumothorax    Polycythemia vera(238.4) 06/17/2012   PVC's (premature ventricular contractions)    Vitamin D deficiency     Tobacco History: Social History   Tobacco Use  Smoking Status Never  Smokeless Tobacco Never  Tobacco Comments   never used tobacco   Counseling given: Not Answered Tobacco comments: never used tobacco   Outpatient Medications Prior to Visit  Medication Sig Dispense Refill   albuterol (VENTOLIN HFA) 108 (90 Base) MCG/ACT inhaler Inhale 2 puffs into the lungs every 4 (four) hours as needed for wheezing or shortness of breath. 8 g 2   folic acid (FOLVITE) 1 MG tablet TAKE 1 TABLET(1 MG) BY MOUTH DAILY 30 tablet 11  LORazepam (ATIVAN) 0.5 MG tablet TAKE 1 TABLET BY MOUTH EVERY NIGHT AT BEDTIME (Patient taking differently: Take 0.5 mg by mouth at bedtime.) 30 tablet 0   pantoprazole (PROTONIX) 40 MG tablet Take 40 mg by mouth daily.     propranolol (INDERAL) 20 MG tablet Take 20 mg by mouth daily.     Vitamin D, Ergocalciferol, (DRISDOL) 50000 units CAPS capsule Take 1 capsule (50,000 Units total) by mouth every 7 (seven) days. Sundays 30 capsule 3   influenza vaccine adjuvanted (FLUAD QUADRIVALENT) 0.5 ML  injection Inject into the muscle. 0.5 mL 0   STIOLTO RESPIMAT 2.5-2.5 MCG/ACT AERS INHALE 2 PUFFS INTO THE LUNGS DAILY 4 g 11   XARELTO 20 MG TABS tablet TAKE 1 TABLET(20 MG) BY MOUTH DAILY WITH SUPPER 30 tablet 2   No facility-administered medications prior to visit.    Review of Systems  Review of Systems  Constitutional: Negative.   HENT: Negative.    Respiratory: Negative.  Negative for cough and shortness of breath.    Physical Exam  BP 122/62 (BP Location: Right Arm, Cuff Size: Normal)   Pulse 78   Temp 97.8 F (36.6 C)   Ht '5\' 3"'$  (1.6 m)   Wt 140 lb (63.5 kg)   SpO2 96%   BMI 24.80 kg/m  Physical Exam Constitutional:      Appearance: Normal appearance.  HENT:     Head: Normocephalic and atraumatic.  Cardiovascular:     Rate and Rhythm: Normal rate and regular rhythm.  Pulmonary:     Effort: Pulmonary effort is normal.     Breath sounds: Normal breath sounds.  Skin:    General: Skin is warm and dry.  Neurological:     General: No focal deficit present.     Mental Status: She is alert and oriented to person, place, and time. Mental status is at baseline.  Psychiatric:        Mood and Affect: Mood normal.        Behavior: Behavior normal.        Thought Content: Thought content normal.        Judgment: Judgment normal.      Lab Results:  CBC    Component Value Date/Time   WBC 5.7 12/02/2021 1307   WBC 6.0 06/06/2021 0623   RBC 4.61 12/02/2021 1308   RBC 4.59 12/02/2021 1307   HGB 15.5 (H) 12/02/2021 1307   HGB 15.5 01/20/2017 1344   HCT 46.1 (H) 12/02/2021 1307   HCT 44.2 01/20/2017 1344   PLT 140 (L) 12/02/2021 1307   PLT 142 (L) 01/20/2017 1344   MCV 100.4 (H) 12/02/2021 1307   MCV 103 (H) 01/20/2017 1344   MCH 33.8 12/02/2021 1307   MCHC 33.6 12/02/2021 1307   RDW 12.0 12/02/2021 1307   RDW 11.9 01/20/2017 1344   LYMPHSABS 1.4 12/02/2021 1307   LYMPHSABS 1.1 01/20/2017 1344   MONOABS 0.5 12/02/2021 1307   EOSABS 0.1 12/02/2021 1307    EOSABS 0.1 01/20/2017 1344   BASOSABS 0.0 12/02/2021 1307   BASOSABS 0.0 01/20/2017 1344    BMET    Component Value Date/Time   NA 135 12/02/2021 1307   NA 144 01/20/2017 1344   NA 138 01/17/2016 1356   K 3.9 12/02/2021 1307   K 3.6 01/20/2017 1344   K 4.1 01/17/2016 1356   CL 97 (L) 12/02/2021 1307   CL 107 01/20/2017 1344   CO2 27 12/02/2021 1307   CO2 25 01/20/2017 1344  CO2 25 01/17/2016 1356   GLUCOSE 122 (H) 12/02/2021 1307   GLUCOSE 108 01/20/2017 1344   BUN 9 12/02/2021 1307   BUN 12 01/20/2017 1344   BUN 12.3 01/17/2016 1356   CREATININE 0.66 12/02/2021 1307   CREATININE 0.7 01/20/2017 1344   CREATININE 0.8 01/17/2016 1356   CALCIUM 9.4 12/02/2021 1307   CALCIUM 8.9 01/20/2017 1344   CALCIUM 9.1 01/17/2016 1356   GFRNONAA >60 12/02/2021 1307   GFRAA >60 10/09/2019 1339    BNP No results found for: "BNP"  ProBNP No results found for: "PROBNP"  Imaging: No results found.   Assessment & Plan:   COPD (chronic obstructive pulmonary disease) ([place]) - Improved; Symbicort changed to [place] in 2022. Breathing a lot better on LABA/LAMA combo.   Laryngopharyngeal reflux (LPR) - Cough improved on Protonix. She is concerned about long term use of PPI d/t her history of osteoporosis. Recommend trial off Protonix and follow GERD diet closely. If symptoms flare can try H2 blocker.   History of pulmonary embolus (PE) - Following with hematology, maintained on Xarelto '20mg'$  daily   Martyn Ehrich, NP 12/14/2021

## 2021-12-01 NOTE — Patient Instructions (Addendum)
Recommendations: Continue Stiolto Respimat two puffs daily in the morning  Continue Protonix '40mg'$  daily (I will reach out to you after I speak with pharmacy about osteoporosis risk) Continue Vit D and folic acid Continue Xarelto  Follow-up: 1 year with Dr. Lamonte Sakai or sooner if needed    Pantoprazole Tablets What is this medication? PANTOPRAZOLE (pan TOE pra zole) treats heartburn, stomach ulcers, reflux disease, or other conditions that cause too much stomach acid. It works by reducing the amount of acid in the stomach. It belongs to a group of medications called PPIs. This medicine may be used for other purposes; ask your health care provider or pharmacist if you have questions. COMMON BRAND NAME(S): Protonix What should I tell my care team before I take this medication? They need to know if you have any of these conditions: Liver disease Low levels of calcium, magnesium, or potassium in the blood Lupus An unusual or allergic reaction to pantoprazole, other medication, foods, dyes, or preservatives Pregnant or trying to get pregnant Breast-feeding How should I use this medication? Take this medication by mouth. Swallow the tablets whole with a drink of water. Follow the directions on the prescription label. Do not crush, break, or chew. Take your medication at regular intervals. Do not take your medication more often than directed. A special MedGuide will be given to you by the pharmacist with each prescription and refill. Be sure to read this information carefully each time. Talk to your care team about the use of this medication in children. While this medication may be prescribed for children as young as 5 years for selected conditions, precautions do apply. Overdosage: If you think you have taken too much of this medicine contact a poison control center or emergency room at once. NOTE: This medicine is only for you. Do not share this medicine with others. What if I miss a  dose? If you miss a dose, take it as soon as you can. If it is almost time for your next dose, take only that dose. Do not take double or extra doses. What may interact with this medication? Do not take this medication with any of the following: Atazanavir Nelfinavir This medication may also interact with the following: Ampicillin Delavirdine Erlotinib Iron salts Medications for fungal infections like ketoconazole, itraconazole and voriconazole Methotrexate Mycophenolate mofetil Warfarin This list may not describe all possible interactions. Give your health care provider a list of all the medicines, herbs, non-prescription drugs, or dietary supplements you use. Also tell them if you smoke, drink alcohol, or use illegal drugs. Some items may interact with your medicine. What should I watch for while using this medication? It can take several days before your stomach pain gets better. Check with your care team if your condition does not start to get better, or if it gets worse. Do not treat diarrhea with over the counter products. Contact your care team if you have diarrhea that lasts more than 2 days or if it is severe and watery. You may need blood work done while you are taking this medication. Using this medication for a long time may weaken your bones. The risk of bone fractures may be increased. Talk to your care team about your bone health. Using this medication for a long time may cause growths (polyps) in the stomach. They usually don't cause any symptoms. They are usually not cancerous. Contact your care team if you notice pain or tenderness when you press your stomach, have nausea, or see  bloody or black, tar-like stools. This medication may cause a decrease in vitamin B12. You should make sure that you get enough vitamin B12 while you are taking this medication. Discuss the foods you eat and the vitamins you take with your care team. What side effects may I notice from receiving this  medication? Side effects that you should report to your care team as soon as possible: Allergic reactions--skin rash, itching, hives, swelling of the face, lips, tongue, or throat Kidney injury--decrease in the amount of urine, swelling of the ankles, hands, or feet Low magnesium level--muscle pain or cramps, unusual weakness or fatigue, fast or irregular heartbeat, tremors Low vitamin B12 level--pain, tingling, or numbness in the hands or feet, muscle weakness, dizziness, confusion, difficulty concentrating Rash on the cheeks or ams that gets worse in the sun Redness, blistering, peeling, or loosening of the skin, including inside the mouth Severe diarrhea, fever Unusual bruising or bleeding Side effects that usually do not require medical attention (report to your care team if they continue or are bothersome): Diarrhea Headache Vomiting This list may not describe all possible side effects. Call your doctor for medical advice about side effects. You may report side effects to FDA at 1-800-FDA-1088. Where should I keep my medication? Keep out of the reach of children and pets. Store at room temperature between 15 and 30 degrees C (59 and 86 degrees F). Protect from light and moisture. Throw away any unused medication after the expiration date. NOTE: This sheet is a summary. It may not cover all possible information. If you have questions about this medicine, talk to your doctor, pharmacist, or health care provider.  2023 Elsevier/Gold Standard (2007-03-05 00:00:00)

## 2021-12-01 NOTE — Telephone Encounter (Signed)
PPI use is associated with an increased risk for fragility fracture (fracture resulting from a fall from a height of standing or lower) but does not show an association between PPI use and osteoporosis/bone mineral density loss. The data is inconsistent on the specific incidence. I have included a study from 2008 that follows a decent sample to determine risk with PPI use if they have used it for >7 years, since this patient has been taking pantoprazole since 2017.   Reference: [place] LE, Lix LM, Metge CJ, Prior HJ, Danella Penton WD. Use of proton pump inhibitors and risk of osteoporosis-related fractures. CMAJ. 2008;179(4):319-326. [id number]  Junius Finner [employer] PharmD Candidate [phone]

## 2021-12-02 ENCOUNTER — Inpatient Hospital Stay: Payer: Medicare Other

## 2021-12-02 ENCOUNTER — Inpatient Hospital Stay: Payer: Medicare Other | Attending: Hematology & Oncology

## 2021-12-02 ENCOUNTER — Other Ambulatory Visit: Payer: Self-pay

## 2021-12-02 ENCOUNTER — Encounter: Payer: Self-pay | Admitting: Family

## 2021-12-02 ENCOUNTER — Inpatient Hospital Stay (HOSPITAL_BASED_OUTPATIENT_CLINIC_OR_DEPARTMENT_OTHER): Payer: Medicare Other | Admitting: Family

## 2021-12-02 VITALS — BP 120/54 | HR 67

## 2021-12-02 DIAGNOSIS — D751 Secondary polycythemia: Secondary | ICD-10-CM

## 2021-12-02 DIAGNOSIS — I82422 Acute embolism and thrombosis of left iliac vein: Secondary | ICD-10-CM

## 2021-12-02 DIAGNOSIS — E7211 Homocystinuria: Secondary | ICD-10-CM | POA: Diagnosis not present

## 2021-12-02 DIAGNOSIS — Z8759 Personal history of other complications of pregnancy, childbirth and the puerperium: Secondary | ICD-10-CM | POA: Diagnosis not present

## 2021-12-02 DIAGNOSIS — D45 Polycythemia vera: Secondary | ICD-10-CM | POA: Diagnosis not present

## 2021-12-02 DIAGNOSIS — D508 Other iron deficiency anemias: Secondary | ICD-10-CM

## 2021-12-02 DIAGNOSIS — Z7901 Long term (current) use of anticoagulants: Secondary | ICD-10-CM | POA: Insufficient documentation

## 2021-12-02 DIAGNOSIS — R760 Raised antibody titer: Secondary | ICD-10-CM

## 2021-12-02 DIAGNOSIS — I2699 Other pulmonary embolism without acute cor pulmonale: Secondary | ICD-10-CM | POA: Diagnosis not present

## 2021-12-02 DIAGNOSIS — Z86718 Personal history of other venous thrombosis and embolism: Secondary | ICD-10-CM

## 2021-12-02 LAB — CBC WITH DIFFERENTIAL (CANCER CENTER ONLY)
Abs Immature Granulocytes: 0.04 10*3/uL (ref 0.00–0.07)
Basophils Absolute: 0 10*3/uL (ref 0.0–0.1)
Basophils Relative: 1 %
Eosinophils Absolute: 0.1 10*3/uL (ref 0.0–0.5)
Eosinophils Relative: 3 %
HCT: 46.1 % — ABNORMAL HIGH (ref 36.0–46.0)
Hemoglobin: 15.5 g/dL — ABNORMAL HIGH (ref 12.0–15.0)
Immature Granulocytes: 1 %
Lymphocytes Relative: 25 %
Lymphs Abs: 1.4 10*3/uL (ref 0.7–4.0)
MCH: 33.8 pg (ref 26.0–34.0)
MCHC: 33.6 g/dL (ref 30.0–36.0)
MCV: 100.4 fL — ABNORMAL HIGH (ref 80.0–100.0)
Monocytes Absolute: 0.5 10*3/uL (ref 0.1–1.0)
Monocytes Relative: 9 %
Neutro Abs: 3.5 10*3/uL (ref 1.7–7.7)
Neutrophils Relative %: 61 %
Platelet Count: 140 10*3/uL — ABNORMAL LOW (ref 150–400)
RBC: 4.59 MIL/uL (ref 3.87–5.11)
RDW: 12 % (ref 11.5–15.5)
WBC Count: 5.7 10*3/uL (ref 4.0–10.5)
nRBC: 0 % (ref 0.0–0.2)

## 2021-12-02 LAB — IRON AND IRON BINDING CAPACITY (CC-WL,HP ONLY)
Iron: 159 ug/dL (ref 28–170)
Saturation Ratios: 48 % — ABNORMAL HIGH (ref 10.4–31.8)
TIBC: 335 ug/dL (ref 250–450)
UIBC: 176 ug/dL (ref 148–442)

## 2021-12-02 LAB — LACTATE DEHYDROGENASE: LDH: 149 U/L (ref 98–192)

## 2021-12-02 LAB — CMP (CANCER CENTER ONLY)
ALT: 23 U/L (ref 0–44)
AST: 30 U/L (ref 15–41)
Albumin: 3.8 g/dL (ref 3.5–5.0)
Alkaline Phosphatase: 90 U/L (ref 38–126)
Anion gap: 11 (ref 5–15)
BUN: 9 mg/dL (ref 8–23)
CO2: 27 mmol/L (ref 22–32)
Calcium: 9.4 mg/dL (ref 8.9–10.3)
Chloride: 97 mmol/L — ABNORMAL LOW (ref 98–111)
Creatinine: 0.66 mg/dL (ref 0.44–1.00)
GFR, Estimated: >60 mL/min (ref >60)
Glucose, Bld: 122 mg/dL — ABNORMAL HIGH (ref 70–99)
Potassium: 3.9 mmol/L (ref 3.5–5.1)
Sodium: 135 mmol/L (ref 135–145)
Total Bilirubin: 1.3 mg/dL — ABNORMAL HIGH (ref 0.3–1.2)
Total Protein: 7.1 g/dL (ref 6.5–8.1)

## 2021-12-02 LAB — RETICULOCYTES
Immature Retic Fract: 11.2 % (ref 2.3–15.9)
RBC.: 4.61 MIL/uL (ref 3.87–5.11)
Retic Count, Absolute: 107.4 10*3/uL (ref 19.0–186.0)
Retic Ct Pct: 2.3 % (ref 0.4–3.1)

## 2021-12-02 LAB — FERRITIN: Ferritin: 46 ng/mL (ref 11–307)

## 2021-12-02 MED ORDER — SODIUM CHLORIDE 0.9 % IV SOLN: INTRAVENOUS | Status: DC

## 2021-12-02 NOTE — Progress Notes (Signed)
IV restarted to right ac with 24 ga angio cath per [facility] RN.  500 ml NS infused without difficulty per order of S. Eulas Post NP.

## 2021-12-02 NOTE — Patient Instructions (Addendum)
Therapeutic Phlebotomy Discharge Instructions  - Increase your fluid intake over the next 4 hours  - No smoking for 30 minutes  - Avoid using the affected arm (the one you had the blood drawn from) for heavy lifting or other activities.  - You may resume all normal activities after 30 minutes.  You are to notify the office if you experience:   - Persistent dizziness and/or lightheadedness -Uncontrolled or excessive bleeding at the site.   Dehydration, Adult Dehydration is condition in which there is not enough water or other fluids in the body. This happens when a person loses more fluids than he or she takes in. Important body parts cannot work right without the right amount of fluids. Any loss of fluids from the body can cause dehydration. Dehydration can be mild, worse, or very bad. It should be treated right away to keep it from getting very bad. What are the causes? This condition may be caused by: Conditions that cause loss of water or other fluids, such as: Watery poop (diarrhea). Vomiting. Sweating a lot. Peeing (urinating) a lot. Not drinking enough fluids, especially when you: Are ill. Are doing things that take a lot of energy to do. Other illnesses and conditions, such as fever or infection. Certain medicines, such as medicines that take extra fluid out of the body (diuretics). Lack of safe drinking water. Not being able to get enough water and food. What increases the risk? The following factors may make you more likely to develop this condition: Having a long-term (chronic) illness that has not been treated the right way, such as: Diabetes. Heart disease. Kidney disease. Being 57 years of age or older. Having a disability. Living in a place that is high above the ground or sea (high in altitude). The thinner, dried air causes more fluid loss. Doing exercises that put stress on your body for a long time. What are the signs or symptoms? Symptoms of dehydration  depend on how bad it is. Mild or worse dehydration Thirst. Dry lips or dry mouth. Feeling dizzy or light-headed, especially when you stand up from sitting. Muscle cramps. Your body making: Dark pee (urine). Pee may be the color of tea. Less pee than normal. Less tears than normal. Headache. Very bad dehydration Changes in skin. Skin may: Be cold to the touch (clammy). Be blotchy or pale. Not go back to normal right after you lightly pinch it and let it go. Little or no tears, pee, or sweat. Changes in vital signs, such as: Fast breathing. Low blood pressure. Weak pulse. Pulse that is more than 100 beats a minute when you are sitting still. Other changes, such as: Feeling very thirsty. Eyes that look hollow (sunken). Cold hands and feet. Being mixed up (confused). Being very tired (lethargic) or having trouble waking from sleep. Short-term weight loss. Loss of consciousness. How is this treated? Treatment for this condition depends on how bad it is. Treatment should start right away. Do not wait until your condition gets very bad. Very bad dehydration is an emergency. You will need to go to a hospital. Mild or worse dehydration can be treated at home. You may be asked to: Drink more fluids. Drink an oral rehydration solution (ORS). This drink helps get the right amounts of fluids and salts and minerals in the blood (electrolytes). Very bad dehydration can be treated: With fluids through an IV tube. By getting normal levels of salts and minerals in your blood. This is often done by giving  salts and minerals through a tube. The tube is passed through your nose and into your stomach. By treating the root cause. Follow these instructions at home: Oral rehydration solution If told by your doctor, drink an ORS: Make an ORS. Use instructions on the package. Start by drinking small amounts, about  cup (120 mL) every 5-10 minutes. Slowly drink more until you have had the amount  that your doctor said to have. Eating and drinking        Drink enough clear fluid to keep your pee pale yellow. If you were told to drink an ORS, finish the ORS first. Then, start slowly drinking other clear fluids. Drink fluids such as: Water. Do not drink only water. Doing that can make the salt (sodium) level in your body get too low. Water from ice chips you suck on. Fruit juice that you have added water to (diluted). Low-calorie sports drinks. Eat foods that have the right amounts of salts and minerals, such as: Bananas. Oranges. Potatoes. Tomatoes. Spinach. Do not drink alcohol. Avoid: Drinks that have a lot of sugar. These include: High-calorie sports drinks. Fruit juice that you did not add water to. Soda. Caffeine. Foods that are greasy or have a lot of fat or sugar. General instructions Take over-the-counter and prescription medicines only as told by your doctor. Do not take salt tablets. Doing that can make the salt level in your body get too high. Return to your normal activities as told by your doctor. Ask your doctor what activities are safe for you. Keep all follow-up visits as told by your doctor. This is important. Contact a doctor if: You have pain in your belly (abdomen) and the pain: Gets worse. Stays in one place. You have a rash. You have a stiff neck. You get angry or annoyed (irritable) more easily than normal. You are more tired or have a harder time waking than normal. You feel: Weak or dizzy. Very thirsty. Get help right away if you have: Any symptoms of very bad dehydration. Symptoms of vomiting, such as: You cannot eat or drink without vomiting. Your vomiting gets worse or does not go away. Your vomit has blood or green stuff in it. Symptoms that get worse with treatment. A fever. A very bad headache. Problems with peeing or pooping (having a bowel movement), such as: Watery poop that gets worse or does not go away. Blood in your poop  (stool). This may cause poop to look black and tarry. Not peeing in 6-8 hours. Peeing only a small amount of very dark pee in 6-8 hours. Trouble breathing. These symptoms may be an emergency. Do not wait to see if the symptoms will go away. Get medical help right away. Call your local emergency services (911 in the [place] Do not drive yourself to the hospital. Summary Dehydration is a condition in which there is not enough water or other fluids in the body. This happens when a person loses more fluids than he or she takes in. Treatment for this condition depends on how bad it is. Treatment should be started right away. Do not wait until your condition gets very bad. Drink enough clear fluid to keep your pee pale yellow. If you were told to drink an oral rehydration solution (ORS), finish the ORS first. Then, start slowly drinking other clear fluids. Take over-the-counter and prescription medicines only as told by your doctor. Get help right away if you have any symptoms of very bad dehydration. This information is not  intended to replace advice given to you by your health care provider. Make sure you discuss any questions you have with your health care provider. Document Revised: 05/21/2021 Document Reviewed: 08/25/2018 Elsevier Patient Education  [place].

## 2021-12-02 NOTE — Progress Notes (Signed)
Hematology and Oncology Follow Up Visit  [name] [id number] [birth year] 76 y.o. 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 Hct is 46.1%. she has a UTI and is currently on Macrobid for 7 days. She has tried what sounds like AZO for pain but this is not helping. She as an order for Pyridium which she will fill later today.  The UTI has also exacerbated her lower back pain. She has been able to see a neurologist and they are doing trials with electromagnetic therapy. So far she states that this is going well.  No falls or syncope reported. She stall ambulates with a walker of Rolator for added support.  No swelling, numbness or tingling in her extremities at this time.  No fever, chills, n/v, cough, rash, dizziness, SOB, chest pain, palpitations, abdominal pain or changes in bowel habits.  She feels fatigued.  No blood loss, bruising or petechiae.  Appetite and hydration have been good. Weight is stable at 142 lbs.   ECOG Performance Status: 1 - Symptomatic but completely ambulatory  Medications:  Allergies as of 12/02/2021       Reactions   Dulera [mometasone Furo-formoterol Fum] Cough        Medication List        Accurate as of December 02, 2021  1:31 PM. If you have any questions, ask your nurse or doctor.          albuterol 108 (90 Base) MCG/ACT inhaler Commonly known as: VENTOLIN HFA Inhale 2 puffs into the lungs  every 4 (four) hours as needed for wheezing or shortness of breath.   Fluad Quadrivalent 0.5 ML injection Generic drug: influenza vaccine adjuvanted Inject into the muscle.   folic acid 1 MG tablet Commonly known as: FOLVITE TAKE 1 TABLET(1 MG) BY MOUTH DAILY   LORazepam 0.5 MG tablet Commonly known as: ATIVAN TAKE 1 TABLET BY MOUTH EVERY NIGHT AT BEDTIME   pantoprazole 40 MG tablet Commonly known as: PROTONIX Take 40 mg by mouth daily.   propranolol 20 MG tablet Commonly known as: INDERAL Take 20 mg by mouth daily.   Stiolto Respimat 2.5-2.5 MCG/ACT Aers Generic drug: Tiotropium Bromide-Olodaterol Inhale 2 puffs into the lungs daily.   Vitamin D (Ergocalciferol) 1.25 MG (50000 UNIT) Caps capsule Commonly known as: DRISDOL Take 1 capsule (50,000 Units total) by mouth every 7 (seven) days. Sundays   Xarelto 20 MG Tabs tablet Generic drug: rivaroxaban TAKE 1 TABLET(20 MG) BY MOUTH DAILY WITH SUPPER        Allergies:  Allergies  Allergen Reactions   Dulera [Mometasone Furo-Formoterol Fum] Cough    Past Medical History, Surgical history, Social history, and Family History were reviewed and updated.  Review of Systems: All other 10 point review of systems is negative.   Physical Exam:  vitals were not taken for this visit.   Wt Readings from Last 3 Encounters:  12/01/21 140 lb (63.5 kg)  10/21/21  141 lb 1.9 oz (64 kg)  08/18/21 140 lb 12.8 oz (63.9 kg)    Ocular: Sclerae unicteric, pupils equal, round and reactive to light Ear-nose-throat: Oropharynx clear, dentition fair Lymphatic: No cervical or supraclavicular adenopathy Lungs no rales or rhonchi, good excursion bilaterally Heart regular rate and rhythm, no murmur appreciated Abd soft, nontender, positive bowel sounds MSK no focal spinal tenderness, no joint edema Neuro: non-focal, well-oriented, appropriate affect Breasts: Deferred   Lab Results  Component Value Date   WBC 5.7 12/02/2021   HGB  15.5 (H) 12/02/2021   HCT 46.1 (H) 12/02/2021   MCV 100.4 (H) 12/02/2021   PLT 140 (L) 12/02/2021   Lab Results  Component Value Date   FERRITIN 37 10/21/2021   IRON 112 10/21/2021   TIBC 332 10/21/2021   UIBC 220 10/21/2021   IRONPCTSAT 34 (H) 10/21/2021   Lab Results  Component Value Date   RETICCTPCT 2.3 12/02/2021   RBC 4.61 12/02/2021   RETICCTABS 65.9 06/12/2014   Lab Results  Component Value Date   KAPLAMBRATIO 0.91 04/27/2016   Lab Results  Component Value Date   IGGSERUM 897 04/27/2016   IGA 339 02/27/2014   IGMSERUM 132 04/27/2016   Lab Results  Component Value Date   MSPIKE Not Observed 04/27/2016     Chemistry      Component Value Date/Time   NA 135 10/21/2021 1333   NA 144 01/20/2017 1344   NA 138 01/17/2016 1356   K 3.8 10/21/2021 1333   K 3.6 01/20/2017 1344   K 4.1 01/17/2016 1356   CL 100 10/21/2021 1333   CL 107 01/20/2017 1344   CO2 25 10/21/2021 1333   CO2 25 01/20/2017 1344   CO2 25 01/17/2016 1356   BUN 11 10/21/2021 1333   BUN 12 01/20/2017 1344   BUN 12.3 01/17/2016 1356   CREATININE 0.63 10/21/2021 1333   CREATININE 0.7 01/20/2017 1344   CREATININE 0.8 01/17/2016 1356      Component Value Date/Time   CALCIUM 9.6 10/21/2021 1333   CALCIUM 8.9 01/20/2017 1344   CALCIUM 9.1 01/17/2016 1356   ALKPHOS 84 10/21/2021 1333   ALKPHOS 68 01/20/2017 1344   ALKPHOS 87 01/17/2016 1356   AST 37 10/21/2021 1333   AST 48 (H) 01/17/2016 1356   ALT 30 10/21/2021 1333   ALT 49 (H) 01/20/2017 1344   ALT 38 01/17/2016 1356   BILITOT 1.2 10/21/2021 1333   BILITOT 0.67 01/17/2016 1356       Impression and Plan: [name] is a very pleasant 76 yo caucasian female with polycythemia, 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 She was diagnosed with extensive left lower extremity DVT and small right PE in February 2021 (resolved on CT angio and [place] in June 2021). She has had a mildly elevated homocystine level and  positive lupus anticoagulant. She is on lifelong anticoagulant therapy.  She will get a phlebotomy with replacement fluids today, Hct 46.1%.  Iron studies pending.  Follow-up in 2 months.   Lottie Dawson, NP 11/7/20231:31 PM

## 2021-12-02 NOTE — Progress Notes (Signed)
[name] presents today for phlebotomy per MD orders. Phlebotomy procedure started at 2:35 PM and ended at 2:55 PM 550 grams removed via 20 gauge needle to right AC.  Patient observed for 30 minutes after procedure without any incident. Patient tolerated procedure well and received replacement fluids after procedure.  Patient understands to call if he has any questions or concerns post discharge.

## 2021-12-03 ENCOUNTER — Other Ambulatory Visit: Payer: Self-pay | Admitting: Hematology & Oncology

## 2021-12-03 DIAGNOSIS — I82412 Acute embolism and thrombosis of left femoral vein: Secondary | ICD-10-CM

## 2021-12-03 DIAGNOSIS — I2699 Other pulmonary embolism without acute cor pulmonale: Secondary | ICD-10-CM

## 2021-12-07 LAB — HOMOCYSTEINE: Homocysteine: 23.6 umol/L — ABNORMAL HIGH (ref 0.0–19.2)

## 2021-12-08 ENCOUNTER — Telehealth: Payer: Self-pay | Admitting: Family

## 2021-12-08 DIAGNOSIS — N3000 Acute cystitis without hematuria: Secondary | ICD-10-CM | POA: Diagnosis not present

## 2021-12-08 DIAGNOSIS — R300 Dysuria: Secondary | ICD-10-CM | POA: Diagnosis not present

## 2021-12-08 DIAGNOSIS — R310 Gross hematuria: Secondary | ICD-10-CM | POA: Diagnosis not present

## 2021-12-08 NOTE — Telephone Encounter (Signed)
Contacted pt to schedule appts per 12/02/21 LOS. Unable to reach via phone, voicemail was left requesting she call the office back to schedule.

## 2021-12-10 DIAGNOSIS — L293 Anogenital pruritus, unspecified: Secondary | ICD-10-CM | POA: Diagnosis not present

## 2021-12-12 ENCOUNTER — Telehealth: Payer: Self-pay | Admitting: *Deleted

## 2021-12-12 NOTE — Telephone Encounter (Signed)
Call received from patient stating that she would like to change to seeing a provider at the [facility] d/t it is closer to her home. Message sent to scheduling to get pt.'s next appts scheduled with a provider at [facility].

## 2021-12-14 NOTE — Assessment & Plan Note (Signed)
-   Improved; Symbicort changed to Stiolto in 2022. Breathing a lot better on LABA/LAMA combo.

## 2021-12-14 NOTE — Assessment & Plan Note (Addendum)
-   Cough improved on Protonix. She is concerned about long term use of PPI d/t her history of osteoporosis. Recommend trial off Protonix and follow GERD diet closely. If symptoms flare can try H2 blocker.

## 2021-12-14 NOTE — Telephone Encounter (Signed)
Can you let patient know I spoke with pharmacist. PPI use is associated with increased risk for fragility fracture which is a fracture resulting form a fall but does not show an associated between PPI use and osteoporosis. Data is inconsistent about specific incidence rate.   I'd recommend she trial off protonix and if cough symptoms flare we can either resume or switch to H2 blocker like pepcid.

## 2021-12-14 NOTE — Assessment & Plan Note (Addendum)
-   Following with hematology, maintained on Xarelto '20mg'$  daily

## 2021-12-15 NOTE — Telephone Encounter (Signed)
Called and spoke with pt letting her know the info from [facility] and she verbalized understanding. Nothing further needed.

## 2021-12-22 ENCOUNTER — Other Ambulatory Visit: Payer: Medicare Other

## 2021-12-22 ENCOUNTER — Telehealth: Payer: Self-pay | Admitting: *Deleted

## 2021-12-22 ENCOUNTER — Ambulatory Visit: Payer: Medicare Other | Admitting: Family

## 2021-12-22 NOTE — Telephone Encounter (Signed)
PAtient called stating that she had not heard back about changing providers to [facility].  Scheduling message sent to change patient provider/appts to [facility]

## 2021-12-30 DIAGNOSIS — M47816 Spondylosis without myelopathy or radiculopathy, lumbar region: Secondary | ICD-10-CM | POA: Diagnosis not present

## 2022-01-02 DIAGNOSIS — R310 Gross hematuria: Secondary | ICD-10-CM | POA: Diagnosis not present

## 2022-01-02 DIAGNOSIS — N3000 Acute cystitis without hematuria: Secondary | ICD-10-CM | POA: Diagnosis not present

## 2022-01-02 DIAGNOSIS — R300 Dysuria: Secondary | ICD-10-CM | POA: Diagnosis not present

## 2022-01-12 DIAGNOSIS — M47816 Spondylosis without myelopathy or radiculopathy, lumbar region: Secondary | ICD-10-CM | POA: Diagnosis not present

## 2022-01-29 ENCOUNTER — Other Ambulatory Visit: Payer: Self-pay

## 2022-01-29 ENCOUNTER — Inpatient Hospital Stay: Payer: Medicare Other | Attending: Hematology & Oncology | Admitting: Hematology and Oncology

## 2022-01-29 ENCOUNTER — Inpatient Hospital Stay: Payer: Medicare Other

## 2022-01-29 VITALS — BP 141/64 | HR 77 | Temp 98.0°F | Resp 16 | Wt 139.9 lb

## 2022-01-29 DIAGNOSIS — Z7901 Long term (current) use of anticoagulants: Secondary | ICD-10-CM | POA: Insufficient documentation

## 2022-01-29 DIAGNOSIS — M810 Age-related osteoporosis without current pathological fracture: Secondary | ICD-10-CM | POA: Diagnosis not present

## 2022-01-29 DIAGNOSIS — D508 Other iron deficiency anemias: Secondary | ICD-10-CM | POA: Diagnosis not present

## 2022-01-29 DIAGNOSIS — I2699 Other pulmonary embolism without acute cor pulmonale: Secondary | ICD-10-CM | POA: Diagnosis not present

## 2022-01-29 DIAGNOSIS — D751 Secondary polycythemia: Secondary | ICD-10-CM

## 2022-01-29 DIAGNOSIS — D45 Polycythemia vera: Secondary | ICD-10-CM | POA: Insufficient documentation

## 2022-01-29 LAB — CBC WITH DIFFERENTIAL (CANCER CENTER ONLY)
Abs Immature Granulocytes: 0.05 10*3/uL (ref 0.00–0.07)
Basophils Absolute: 0 10*3/uL (ref 0.0–0.1)
Basophils Relative: 1 %
Eosinophils Absolute: 0.1 10*3/uL (ref 0.0–0.5)
Eosinophils Relative: 1 %
HCT: 40 % (ref 36.0–46.0)
Hemoglobin: 13.6 g/dL (ref 12.0–15.0)
Immature Granulocytes: 1 %
Lymphocytes Relative: 16 %
Lymphs Abs: 1.2 10*3/uL (ref 0.7–4.0)
MCH: 34.2 pg — ABNORMAL HIGH (ref 26.0–34.0)
MCHC: 34 g/dL (ref 30.0–36.0)
MCV: 100.5 fL — ABNORMAL HIGH (ref 80.0–100.0)
Monocytes Absolute: 0.7 10*3/uL (ref 0.1–1.0)
Monocytes Relative: 10 %
Neutro Abs: 5.6 10*3/uL (ref 1.7–7.7)
Neutrophils Relative %: 71 %
Platelet Count: 163 10*3/uL (ref 150–400)
RBC: 3.98 MIL/uL (ref 3.87–5.11)
RDW: 13 % (ref 11.5–15.5)
WBC Count: 7.7 10*3/uL (ref 4.0–10.5)
nRBC: 0 % (ref 0.0–0.2)

## 2022-01-29 LAB — CMP (CANCER CENTER ONLY)
ALT: 31 U/L (ref 0–44)
AST: 27 U/L (ref 15–41)
Albumin: 3.8 g/dL (ref 3.5–5.0)
Alkaline Phosphatase: 68 U/L (ref 38–126)
Anion gap: 7 (ref 5–15)
BUN: 15 mg/dL (ref 8–23)
CO2: 28 mmol/L (ref 22–32)
Calcium: 9.1 mg/dL (ref 8.9–10.3)
Chloride: 103 mmol/L (ref 98–111)
Creatinine: 0.83 mg/dL (ref 0.44–1.00)
GFR, Estimated: >60 mL/min (ref >60)
Glucose, Bld: 76 mg/dL (ref 70–99)
Potassium: 4.1 mmol/L (ref 3.5–5.1)
Sodium: 138 mmol/L (ref 135–145)
Total Bilirubin: 1.3 mg/dL — ABNORMAL HIGH (ref 0.3–1.2)
Total Protein: 6.3 g/dL — ABNORMAL LOW (ref 6.5–8.1)

## 2022-01-29 LAB — RETIC PANEL
Immature Retic Fract: 9.8 % (ref 2.3–15.9)
RBC.: 3.93 MIL/uL (ref 3.87–5.11)
Retic Count, Absolute: 115.9 10*3/uL (ref 19.0–186.0)
Retic Ct Pct: 3 % (ref 0.4–3.1)
Reticulocyte Hemoglobin: 39.1 pg (ref >27.9)

## 2022-01-29 LAB — IRON AND IRON BINDING CAPACITY (CC-WL,HP ONLY)
Iron: 212 ug/dL — ABNORMAL HIGH (ref 28–170)
Saturation Ratios: 69 % — ABNORMAL HIGH (ref 10.4–31.8)
TIBC: 308 ug/dL (ref 250–450)
UIBC: 96 ug/dL — ABNORMAL LOW (ref 148–442)

## 2022-01-29 NOTE — Progress Notes (Signed)
[place] [phone]   [phone]  PROGRESS NOTE  Patient Care Team: Velna Hatchet, MD as PCP - General (Internal Medicine) Aleda Grana (Dentistry)  Hematological/Oncological History # Polycythemia, JAK2 negative # Hereditary Hemochromatosis (H63D heterozygote mutation)  # Unprovoked Pulmonary Embolism  12/02/2021: last visit with [name] at [facility] 01/29/2022: transition care to Dr. Lorenso Courier   Interval History:  [name] 77 y.o. female with medical history significant for polycythemia (JAK2 negative) and Hereditary Hemochromatosis (H63D heterozygote mutation) who presents for a follow up visit. The patient's last visit was on 12/02/2021 with [name]. In the interim since the last visit she has had no major changes in her health.  On exam today [name] reports that she was known to have increased red blood cells for approximately 25 years.  She was followed by primary care provider who subsequently retired and when she had a new PCP she was referred to Dr. Marin Olp for further evaluation.  She was diagnosed with polycythemia which was JAK2 negative.  She was also found to have hemochromatosis.  Due to concern for blood clot she was started on Xarelto therapy 20 mg p.o. daily.  She notes that she has been enduring phlebotomies but reports that they are "awful".  They found that administering fluids with these helped considerably.  She also receives Zometa infusions every 6 months.  She reports that she has chronic back and bone issues and recently underwent 2 operations and PAC ablation.  She reports she usually gets her Zometa in January and July of each year.  She reports that she is a never smoker but that she does have signs and symptoms concerning for obstructive sleep apnea including snoring and daytime somnolence.  She otherwise denies any fevers, chills, sweats, nausea, vomiting or diarrhea.  A full 10 point ROS  was otherwise negative.  MEDICAL HISTORY:  Past Medical History:  Diagnosis Date   Allergy    Anemia    Anxiety    Asthma    DR. BYRUM   Blood transfusion without reported diagnosis    Cataract    Cholelithiasis    Chronic kidney disease    gallstones    Compression fracture of body of thoracic vertebra (HCC)    Depression    Dyspnea    Dysrhythmia    palpitations   GERD (gastroesophageal reflux disease)    Heart attack ([place])    Hemorrhoids    IBS (irritable bowel syndrome)    Iliotibial band syndrome    left knee   Migraine    Mitochondrial myopathy    Normal coronary arteries    by cardiac catheterization 2003   Osteoporosis    Pericarditis    age 36   Pneumothorax    Polycythemia vera(238.4) 06/17/2012   PVC's (premature ventricular contractions)    Vitamin D deficiency     SURGICAL HISTORY: Past Surgical History:  Procedure Laterality Date   APPENDECTOMY     CARDIAC CATHETERIZATION     CATARACT EXTRACTION W/ INTRAOCULAR LENS  IMPLANT, BILATERAL     CESAREAN SECTION     x 1   CHOLECYSTECTOMY N/A 06/05/2021   Procedure: LAPAROSCOPIC CHOLECYSTECTOMY WITH INTRAOPERATIVE CHOLANGIOGRAM;  Surgeon: Jovita Kussmaul, MD;  Location: [facility];  Service: General;  Laterality: N/A;   COLONOSCOPY     DILATATION & CURETTAGE/HYSTEROSCOPY WITH MYOSURE N/A 10/29/2014   Procedure: [place];  Surgeon: Paula Compton, MD;  Location: [place] [facility];  Service: Gynecology;  Laterality: N/A;   IR FLUORO GUIDED NEEDLE PLC ASPIRATION/INJECTION LOC  06/10/2016   IR FLUORO GUIDED NEEDLE PLC ASPIRATION/INJECTION LOC  06/10/2016   KYPHOPLASTY N/A 05/14/2016   Procedure: T 11 KYPHOPLASTY;  Surgeon: Phylliss Bob, MD;  Location: [place];  Service: Orthopedics;  Laterality: N/A;  T 11 KYPHOPLASTY   KYPHOPLASTY N/A 06/18/2016   Procedure: THORACIC 12 KYPHOPLASTY;  Surgeon: Phylliss Bob, MD;  Location: [place];  Service: Orthopedics;  Laterality: N/A;  THORACIC 12  KYPHOPLASTY; REQUEST 1 HOUR AND FLIP ROOM   MOUTH SURGERY     NASAL SINUS SURGERY     NM MYOCAR PERF WALL MOTION  04/27/2006   normal   US ECHOCARDIOGRAPHY  12/25/2010   normal   VARICOSE VEIN SURGERY      SOCIAL HISTORY: Social History   Socioeconomic History   Marital status: Married    Spouse name: Richard   Number of children: 2   Years of education: Grad   Highest education level: Not on file  Occupational History   Occupation: Engineer, manufacturing systems: RETIRED  Tobacco Use   Smoking status: Never   Smokeless tobacco: Never   Tobacco comments:    never used tobacco  Vaping Use   Vaping Use: Never used  Substance and Sexual Activity   Alcohol use: Yes    Alcohol/week: 2.0 standard drinks of alcohol    Types: 2 Glasses of wine per week    Comment: occasionally   Drug use: No   Sexual activity: Not on file  Other Topics Concern   Not on file  Social History Narrative   Health Care POA:    Emergency Contact: husband, Francene Finders, (c) [phone]   End of Life Plan:    Who lives with you: husband   Any pets: none   Diet: Pt has a varied diet.  Eats 5 sm. meals throughout day, focuses on protein, doesn't care for fruits and vegetables very much.   Exercise: Pt has a personal training and exercises several times a week.   Seatbelts: Pt reports wearing seatbelt when in vehicle.   Nancy Fetter Exposure/Protection: Pt reports wearing sun protection.    Hobbies: reading, visiting with friends   Patient has a Scientist, water quality.   Patient has two children.   Patient is retired.   Patient does not drink any caffeine.   Patient is right handed.         Social Determinants of Health   Financial Resource Strain: Not on file  Food Insecurity: Not on file  Transportation Needs: Not on file  Physical Activity: Not on file  Stress: Not on file  Social Connections: Not on file  Intimate Partner Violence: Not on file    FAMILY HISTORY: Family History  Problem Relation Age  of Onset   Asthma Mother    Arthritis Mother    Depression Mother    Hypertension Mother    Tuberculosis Mother    Emphysema Father    Stroke Father    Alcohol abuse Father    Heart attack Father    Lung cancer Father    COPD Brother    Colon cancer Neg Hx    Esophageal cancer Neg Hx    Stomach cancer Neg Hx    Pancreatic cancer Neg Hx     ALLERGIES:  is allergic to dulera [mometasone furo-formoterol fum] and nitrofurantoin macrocrystal.  MEDICATIONS:  Current Outpatient Medications  Medication Sig Dispense Refill   folic acid (  FOLVITE) 1 MG tablet TAKE 1 TABLET(1 MG) BY MOUTH DAILY 30 tablet 11   LORazepam (ATIVAN) 0.5 MG tablet TAKE 1 TABLET BY MOUTH EVERY NIGHT AT BEDTIME (Patient taking differently: Take 0.5 mg by mouth at bedtime.) 30 tablet 0   Melatonin 10 MG TABS Take 10 mg by mouth at bedtime as needed.     pantoprazole (PROTONIX) 40 MG tablet Take 40 mg by mouth daily.     propranolol (INDERAL) 20 MG tablet Take 20 mg by mouth daily.     Tiotropium Bromide-Olodaterol (STIOLTO RESPIMAT) 2.5-2.5 MCG/ACT AERS Inhale 2 puffs into the lungs daily. 4 g 11   Vitamin D, Ergocalciferol, (DRISDOL) 50000 units CAPS capsule Take 1 capsule (50,000 Units total) by mouth every 7 (seven) days. Sundays 30 capsule 3   XARELTO 20 MG TABS tablet TAKE 1 TABLET(20 MG) BY MOUTH DAILY WITH SUPPER 30 tablet 2   No current facility-administered medications for this visit.    REVIEW OF SYSTEMS:   Constitutional: ( - ) fevers, ( - )  chills , ( - ) night sweats Eyes: ( - ) blurriness of vision, ( - ) double vision, ( - ) watery eyes Ears, nose, mouth, throat, and face: ( - ) mucositis, ( - ) sore throat Respiratory: ( - ) cough, ( - ) dyspnea, ( - ) wheezes Cardiovascular: ( - ) palpitation, ( - ) chest discomfort, ( - ) lower extremity swelling Gastrointestinal:  ( - ) nausea, ( - ) heartburn, ( - ) change in bowel habits Skin: ( - ) abnormal skin rashes Lymphatics: ( - ) new  lymphadenopathy, ( - ) easy bruising Neurological: ( - ) numbness, ( - ) tingling, ( - ) new weaknesses Behavioral/Psych: ( - ) mood change, ( - ) new changes  All other systems were reviewed with the patient and are negative.  PHYSICAL EXAMINATION:  Vitals:   01/29/22 1314  BP: (!) 141/64  Pulse: 77  Resp: 16  Temp: 98 F (36.7 C)  SpO2: 100%   Filed Weights   01/29/22 1314  Weight: 139 lb 14.4 oz (63.5 kg)    GENERAL: Well-appearing elderly Caucasian, alert, no distress and comfortable SKIN: skin color, texture, turgor are normal, no rashes or significant lesions EYES: conjunctiva are pink and non-injected, sclera clear LUNGS: clear to auscultation and percussion with normal breathing effort HEART: regular rate & rhythm and no murmurs and no lower extremity edema Musculoskeletal: no cyanosis of digits and no clubbing  PSYCH: alert & oriented x 3, fluent speech NEURO: no focal motor/sensory deficits  LABORATORY DATA:  I have reviewed the data as listed    Latest Ref Rng & Units 01/29/2022    2:18 PM 12/02/2021    1:07 PM 10/21/2021    1:33 PM  CBC  WBC 4.0 - 10.5 K/uL 7.7  5.7  5.8   Hemoglobin 12.0 - 15.0 g/dL 13.6  15.5  15.5   Hematocrit 36.0 - 46.0 % 40.0  46.1  44.9   Platelets 150 - 400 K/uL 163  140  129        Latest Ref Rng & Units 01/29/2022    2:18 PM 12/02/2021    1:07 PM 10/21/2021    1:33 PM  CMP  Glucose 70 - 99 mg/dL 76  122  101   BUN 8 - 23 mg/dL _0 Creatinine 0.44 - 1.00 mg/dL 0.83  0.66  0.63   Sodium 135 -  145 mmol/L 138  135  135   Potassium 3.5 - 5.1 mmol/L 4.1  3.9  3.8   Chloride 98 - 111 mmol/L 103  97  100   CO2 22 - 32 mmol/L _0 Calcium 8.9 - 10.3 mg/dL 9.1  9.4  9.6   Total Protein 6.5 - 8.1 g/dL 6.3  7.1  7.1   Total Bilirubin 0.3 - 1.2 mg/dL 1.3  1.3  1.2   Alkaline Phos 38 - 126 U/L 68  90  84   AST 15 - 41 U/L 27  30  37   ALT 0 - 44 U/L _1 No results found for: "MPROTEIN" Lab Results   Component Value Date   KAPLAMBRATIO 0.91 04/27/2016    RADIOGRAPHIC STUDIES: No results found.  ASSESSMENT & PLAN [name] 77 y.o. female with medical history significant for polycythemia (JAK2 negative) and Hereditary Hemochromatosis (H63D heterozygote mutation) who presents for a follow up visit.  # Polycythemia, Unclear Etiology --patient noted to be JAK2 negative. Will order full MPN panel to r/o other possible etiologies --Of note, bone marrow biopsy performed in 2014 did not show evidence of myeloproliferative neoplasm. -- will order erythropoeitin and iron/ferritin levels --if no driver mutation there is not a clear indication for phlebotomy --patient is a non-smoker with no symptoms of sleep apnea --RTC pending the results of the above studides  # Hereditary Hemochromatosis (H63D heterozygote mutation) --no indication for phlebotomy for an H63D heterozygote.  --continue to monitor   # History of Unprovoked Pulmonary Embolism --continue Xarelto 20 mg PO daily  --positive lupus anticoagulant panel (collect on anticoagulation). Negative antibodies. Low clinic suspicion on APS  Orders Placed This Encounter  Procedures   CBC with Differential ([facility] Only)    Standing Status:   Future    Number of Occurrences:   1    Standing Expiration Date:   01/30/2023   CMP ([facility] only)    Standing Status:   Future    Number of Occurrences:   1    Standing Expiration Date:   01/30/2023   Ferritin    Standing Status:   Future    Number of Occurrences:   1    Standing Expiration Date:   01/30/2023   Iron and Iron Binding Capacity ([facility] only)    Standing Status:   Future    Number of Occurrences:   1    Standing Expiration Date:   01/30/2023   Retic Panel    Standing Status:   Future    Number of Occurrences:   1    Standing Expiration Date:   01/30/2023   JAK2 (INCLUDING V617F AND EXON 12), MPL,& CALR W/RFL MPN PANEL ([facility])    Standing Status:   Future     Number of Occurrences:   1    Standing Expiration Date:   01/29/2023   BCR ABL1 FISH ([facility])    Standing Status:   Future    Number of Occurrences:   1    Standing Expiration Date:   01/30/2023   Erythropoietin    Standing Status:   Future    Number of Occurrences:   1    Standing Expiration Date:   01/29/2023    All questions were answered. The patient knows to call the clinic with any problems, questions or concerns.  A total of more than 40 minutes were spent on this  encounter with face-to-face time and non-face-to-face time, including preparing to see the patient, ordering tests and/or medications, counseling the patient and coordination of care as outlined above.   Ledell Peoples, MD Department of Hematology/Oncology [facility] Phone: [phone] Pager: [phone] Email: [email or web address]  01/29/2022 5:29 PM

## 2022-01-30 ENCOUNTER — Telehealth: Payer: Self-pay | Admitting: Hematology and Oncology

## 2022-01-30 LAB — FERRITIN: Ferritin: 59 ng/mL (ref 11–307)

## 2022-01-30 LAB — ERYTHROPOIETIN: Erythropoietin: 14.1 m[IU]/mL (ref 2.6–18.5)

## 2022-01-30 NOTE — Telephone Encounter (Signed)
Called patient per 1/4 los to schedule infusion for following week. Patient scheduled and notified.

## 2022-02-02 DIAGNOSIS — N3000 Acute cystitis without hematuria: Secondary | ICD-10-CM | POA: Diagnosis not present

## 2022-02-04 ENCOUNTER — Telehealth: Payer: Self-pay

## 2022-02-04 NOTE — Telephone Encounter (Addendum)
Patient called requesting lab results from 01/29/22.  She would like to know when her next MD visit will be and if she should be scheduled for a phlebotomy.  Returned call to patient: Per Dr. Lorenso Courier, awaiting results of genetic testing before decision is made regarding necessity of phlebotomy. Next appointment will be decided at that time.

## 2022-02-05 DIAGNOSIS — M47816 Spondylosis without myelopathy or radiculopathy, lumbar region: Secondary | ICD-10-CM | POA: Diagnosis not present

## 2022-02-06 ENCOUNTER — Other Ambulatory Visit: Payer: Self-pay

## 2022-02-06 ENCOUNTER — Inpatient Hospital Stay: Payer: Medicare Other

## 2022-02-06 VITALS — BP 149/62 | HR 85 | Temp 97.7°F | Resp 16

## 2022-02-06 DIAGNOSIS — I2699 Other pulmonary embolism without acute cor pulmonale: Secondary | ICD-10-CM | POA: Diagnosis not present

## 2022-02-06 DIAGNOSIS — D45 Polycythemia vera: Secondary | ICD-10-CM

## 2022-02-06 DIAGNOSIS — D508 Other iron deficiency anemias: Secondary | ICD-10-CM

## 2022-02-06 DIAGNOSIS — D510 Vitamin B12 deficiency anemia due to intrinsic factor deficiency: Secondary | ICD-10-CM

## 2022-02-06 DIAGNOSIS — Z7901 Long term (current) use of anticoagulants: Secondary | ICD-10-CM | POA: Diagnosis not present

## 2022-02-06 DIAGNOSIS — M8008XG Age-related osteoporosis with current pathological fracture, vertebra(e), subsequent encounter for fracture with delayed healing: Secondary | ICD-10-CM

## 2022-02-06 DIAGNOSIS — M810 Age-related osteoporosis without current pathological fracture: Secondary | ICD-10-CM | POA: Diagnosis not present

## 2022-02-06 LAB — JAK2 (INCLUDING V617F AND EXON 12), MPL,& CALR W/RFL MPN PANEL (NGS)

## 2022-02-06 LAB — BCR ABL1 FISH (GENPATH)

## 2022-02-06 MED ORDER — ZOLEDRONIC ACID 4 MG/100ML IV SOLN
4.0000 mg | Freq: Once | INTRAVENOUS | Status: AC
  Administered 2022-02-06: 4 mg via INTRAVENOUS
  Filled 2022-02-06: qty 100

## 2022-02-06 MED ORDER — SODIUM CHLORIDE 0.9 % IV SOLN: Freq: Once | INTRAVENOUS | Status: AC

## 2022-02-06 NOTE — Patient Instructions (Signed)

## 2022-02-09 DIAGNOSIS — D509 Iron deficiency anemia, unspecified: Secondary | ICD-10-CM | POA: Diagnosis not present

## 2022-02-09 DIAGNOSIS — D510 Vitamin B12 deficiency anemia due to intrinsic factor deficiency: Secondary | ICD-10-CM | POA: Diagnosis not present

## 2022-02-10 DIAGNOSIS — D509 Iron deficiency anemia, unspecified: Secondary | ICD-10-CM | POA: Diagnosis not present

## 2022-02-10 DIAGNOSIS — L820 Inflamed seborrheic keratosis: Secondary | ICD-10-CM | POA: Diagnosis not present

## 2022-02-10 DIAGNOSIS — R7989 Other specified abnormal findings of blood chemistry: Secondary | ICD-10-CM | POA: Diagnosis not present

## 2022-02-10 DIAGNOSIS — D1801 Hemangioma of skin and subcutaneous tissue: Secondary | ICD-10-CM | POA: Diagnosis not present

## 2022-02-10 DIAGNOSIS — L720 Epidermal cyst: Secondary | ICD-10-CM | POA: Diagnosis not present

## 2022-02-10 DIAGNOSIS — L821 Other seborrheic keratosis: Secondary | ICD-10-CM | POA: Diagnosis not present

## 2022-02-10 DIAGNOSIS — F419 Anxiety disorder, unspecified: Secondary | ICD-10-CM | POA: Diagnosis not present

## 2022-02-10 DIAGNOSIS — E559 Vitamin D deficiency, unspecified: Secondary | ICD-10-CM | POA: Diagnosis not present

## 2022-02-10 DIAGNOSIS — Z86711 Personal history of pulmonary embolism: Secondary | ICD-10-CM | POA: Diagnosis not present

## 2022-02-10 DIAGNOSIS — D225 Melanocytic nevi of trunk: Secondary | ICD-10-CM | POA: Diagnosis not present

## 2022-02-13 DIAGNOSIS — M47816 Spondylosis without myelopathy or radiculopathy, lumbar region: Secondary | ICD-10-CM | POA: Diagnosis not present

## 2022-02-16 DIAGNOSIS — D45 Polycythemia vera: Secondary | ICD-10-CM | POA: Diagnosis not present

## 2022-02-16 DIAGNOSIS — Z1339 Encounter for screening examination for other mental health and behavioral disorders: Secondary | ICD-10-CM | POA: Diagnosis not present

## 2022-02-16 DIAGNOSIS — E538 Deficiency of other specified B group vitamins: Secondary | ICD-10-CM | POA: Diagnosis not present

## 2022-02-16 DIAGNOSIS — Z86711 Personal history of pulmonary embolism: Secondary | ICD-10-CM | POA: Diagnosis not present

## 2022-02-16 DIAGNOSIS — Z1331 Encounter for screening for depression: Secondary | ICD-10-CM | POA: Diagnosis not present

## 2022-02-16 DIAGNOSIS — G713 Mitochondrial myopathy, not elsewhere classified: Secondary | ICD-10-CM | POA: Diagnosis not present

## 2022-02-16 DIAGNOSIS — M542 Cervicalgia: Secondary | ICD-10-CM | POA: Diagnosis not present

## 2022-02-16 DIAGNOSIS — D6869 Other thrombophilia: Secondary | ICD-10-CM | POA: Diagnosis not present

## 2022-02-16 DIAGNOSIS — J449 Chronic obstructive pulmonary disease, unspecified: Secondary | ICD-10-CM | POA: Diagnosis not present

## 2022-02-16 DIAGNOSIS — Z0000 Encounter for general adult medical examination without abnormal findings: Secondary | ICD-10-CM | POA: Diagnosis not present

## 2022-02-16 DIAGNOSIS — F331 Major depressive disorder, recurrent, moderate: Secondary | ICD-10-CM | POA: Diagnosis not present

## 2022-02-20 ENCOUNTER — Telehealth: Payer: Self-pay

## 2022-02-20 NOTE — Telephone Encounter (Signed)
Patient called requesting outcome of labs and testing from last MD Visit. She is concerned about need for phlebotomy.  Routed to Dr. Lorenso Courier for Review.

## 2022-02-23 ENCOUNTER — Telehealth: Payer: Self-pay

## 2022-02-23 NOTE — Telephone Encounter (Addendum)
Called patient to advise of message below. Scheduling message sent for lab and MD appointment in 5 months.  ----- Message from Orson Slick, MD sent at 02/22/2022 11:18 AM EST ----- Please let [name] know that based on the results of our bloodwork (including genetic testing) there is no need for phlebotomy moving forward. We can continue to monitor her blood counts but no intervention is required at this time. Please schedule an appointment with our clinic in 5 months time with labs.  ----- Message ----- From: Buel Ream, [facility] [place] Sent: 01/29/2022   2:45 PM EST To: Orson Slick, MD

## 2022-02-26 DIAGNOSIS — N898 Other specified noninflammatory disorders of vagina: Secondary | ICD-10-CM | POA: Diagnosis not present

## 2022-02-26 DIAGNOSIS — N952 Postmenopausal atrophic vaginitis: Secondary | ICD-10-CM | POA: Diagnosis not present

## 2022-02-26 DIAGNOSIS — Z01419 Encounter for gynecological examination (general) (routine) without abnormal findings: Secondary | ICD-10-CM | POA: Diagnosis not present

## 2022-02-27 DIAGNOSIS — M47816 Spondylosis without myelopathy or radiculopathy, lumbar region: Secondary | ICD-10-CM | POA: Diagnosis not present

## 2022-03-04 DIAGNOSIS — E538 Deficiency of other specified B group vitamins: Secondary | ICD-10-CM | POA: Diagnosis not present

## 2022-03-06 DIAGNOSIS — M47816 Spondylosis without myelopathy or radiculopathy, lumbar region: Secondary | ICD-10-CM | POA: Diagnosis not present

## 2022-03-12 ENCOUNTER — Other Ambulatory Visit: Payer: Self-pay | Admitting: Hematology & Oncology

## 2022-03-12 DIAGNOSIS — I2699 Other pulmonary embolism without acute cor pulmonale: Secondary | ICD-10-CM

## 2022-03-12 DIAGNOSIS — I82412 Acute embolism and thrombosis of left femoral vein: Secondary | ICD-10-CM

## 2022-03-13 DIAGNOSIS — M47816 Spondylosis without myelopathy or radiculopathy, lumbar region: Secondary | ICD-10-CM | POA: Diagnosis not present

## 2022-03-18 DIAGNOSIS — D510 Vitamin B12 deficiency anemia due to intrinsic factor deficiency: Secondary | ICD-10-CM | POA: Diagnosis not present

## 2022-03-18 DIAGNOSIS — R400 Somnolence: Secondary | ICD-10-CM | POA: Diagnosis not present

## 2022-03-18 DIAGNOSIS — R0683 Snoring: Secondary | ICD-10-CM | POA: Diagnosis not present

## 2022-03-18 DIAGNOSIS — R5383 Other fatigue: Secondary | ICD-10-CM | POA: Diagnosis not present

## 2022-03-20 DIAGNOSIS — M47816 Spondylosis without myelopathy or radiculopathy, lumbar region: Secondary | ICD-10-CM | POA: Diagnosis not present

## 2022-03-27 DIAGNOSIS — M47816 Spondylosis without myelopathy or radiculopathy, lumbar region: Secondary | ICD-10-CM | POA: Diagnosis not present

## 2022-03-31 DIAGNOSIS — D510 Vitamin B12 deficiency anemia due to intrinsic factor deficiency: Secondary | ICD-10-CM | POA: Diagnosis not present

## 2022-04-08 DIAGNOSIS — D510 Vitamin B12 deficiency anemia due to intrinsic factor deficiency: Secondary | ICD-10-CM | POA: Diagnosis not present

## 2022-04-09 ENCOUNTER — Telehealth: Payer: Self-pay

## 2022-04-09 ENCOUNTER — Other Ambulatory Visit: Payer: Self-pay

## 2022-04-09 DIAGNOSIS — M47816 Spondylosis without myelopathy or radiculopathy, lumbar region: Secondary | ICD-10-CM | POA: Diagnosis not present

## 2022-04-09 DIAGNOSIS — I82412 Acute embolism and thrombosis of left femoral vein: Secondary | ICD-10-CM

## 2022-04-09 DIAGNOSIS — I2699 Other pulmonary embolism without acute cor pulmonale: Secondary | ICD-10-CM

## 2022-04-09 MED ORDER — RIVAROXABAN 20 MG PO TABS: ORAL_TABLET | ORAL | 2 refills | Status: DC

## 2022-04-09 NOTE — Telephone Encounter (Signed)
T/C from pt stating [name] will no longer fill her prescription for Xarelto under Dr Antonieta Pert name and she will need a new prescription under Dr Lorenso Courier.  Rx sent to [facility] on [address] per pt request

## 2022-04-13 DIAGNOSIS — Z23 Encounter for immunization: Secondary | ICD-10-CM | POA: Diagnosis not present

## 2022-04-14 DIAGNOSIS — E538 Deficiency of other specified B group vitamins: Secondary | ICD-10-CM | POA: Diagnosis not present

## 2022-04-14 DIAGNOSIS — M47816 Spondylosis without myelopathy or radiculopathy, lumbar region: Secondary | ICD-10-CM | POA: Diagnosis not present

## 2022-04-16 ENCOUNTER — Encounter: Payer: Self-pay | Admitting: Hematology and Oncology

## 2022-04-16 ENCOUNTER — Encounter: Payer: Self-pay | Admitting: Neurology

## 2022-04-16 ENCOUNTER — Ambulatory Visit (INDEPENDENT_AMBULATORY_CARE_PROVIDER_SITE_OTHER): Payer: Medicare Other | Admitting: Neurology

## 2022-04-16 VITALS — BP 135/73 | HR 73 | Ht 63.0 in | Wt 142.8 lb

## 2022-04-16 DIAGNOSIS — R519 Headache, unspecified: Secondary | ICD-10-CM | POA: Diagnosis not present

## 2022-04-16 DIAGNOSIS — R0683 Snoring: Secondary | ICD-10-CM

## 2022-04-16 DIAGNOSIS — R351 Nocturia: Secondary | ICD-10-CM

## 2022-04-16 DIAGNOSIS — Z9189 Other specified personal risk factors, not elsewhere classified: Secondary | ICD-10-CM | POA: Diagnosis not present

## 2022-04-16 DIAGNOSIS — D751 Secondary polycythemia: Secondary | ICD-10-CM | POA: Diagnosis not present

## 2022-04-16 NOTE — Progress Notes (Signed)
Subjective:    Patient ID: [name] is a 77 y.o. female.  HPI    Faith Age, MD, PhD [facility] [address] [place], [place] [address]  Dear Dr. Ardeth Perfect,  I saw your patient, [name], upon your kind request in my sleep clinic today for initial consultation of her sleep disorder, in particular, concern for underlying obstructive sleep apnea.  The patient is accompanied by her husband today, who also has an appointment today.  As you know, [name] is a 77 year old female with an underlying complex medical history of allergies, asthma, anemia, chronic kidney disease, depression, anxiety, reflux disease, coronary artery disease with history of heart attack, irritable bowel syndrome, migraine headaches, mitochondrial myopathy, osteoporosis, polycythemia, hereditary hemochromatosis, DVT, PVCs, vitamin D deficiency, thoracic compression fracture, chronic kidney disease, and cholelithiasis, who reports snoring and excessive daytime somnolence.  Her Epworth sleepiness score is 6 out of 24, fatigue severity score is 61/63. I reviewed your office records from 03/18/2022, she saw Faith Agent, NP at the time.  She generally goes to bed between 8 and 9.  She does watch TV in her bed, turned off the TV somewhere between 8:30 PM and 8:45 PM typically.  They do not have any pets in the household, she lives with her husband.  She has nocturia about 2-3 times per average night, denies recurrent morning headaches but occasionally has woken up with a sinus pressure and congestion, she has a history of chronic sinus issues.  She has a history of vivid dreams and chronic nightmares, since childhood essentially.  She has had nocturnal cough, she reports that this has improved since she started a PPI.  She drinks caffeine in the form of green tea, usually 1 cup/day.  She drinks alcohol daily in the form of wine, typically 2 glasses.  She is a  non-smoker.  She has not reduced her alcohol consumption and has not been advised to reduce her alcohol consumption per her report.  Her hematologist has encouraged her to get checked out for sleep apnea given her diagnosis of hemochromatosis and polycythemia.  Her Past Medical History Is Significant For: Past Medical History:  Diagnosis Date   Allergy    Anemia    Anxiety    Asthma    DR. BYRUM   Blood transfusion without reported diagnosis    Cataract    Cholelithiasis    Chronic kidney disease    gallstones    Compression fracture of body of thoracic vertebra (HCC)    Depression    Dyspnea    Dysrhythmia    palpitations   GERD (gastroesophageal reflux disease)    Heart attack ([place])    Hemorrhoids    IBS (irritable bowel syndrome)    Iliotibial band syndrome    left knee   Migraine    Mitochondrial myopathy    Normal coronary arteries    by cardiac catheterization 2003   Osteoporosis    Pericarditis    Hickman 69   Pneumothorax    Polycythemia vera(238.4) 06/17/2012   PVC's (premature ventricular contractions)    Vitamin D deficiency     Her Past Surgical History Is Significant For: Past Surgical History:  Procedure Laterality Date   APPENDECTOMY     CARDIAC CATHETERIZATION     CATARACT EXTRACTION W/ INTRAOCULAR LENS  IMPLANT, BILATERAL     CESAREAN SECTION     x 1   CHOLECYSTECTOMY N/A 06/05/2021   Procedure: LAPAROSCOPIC CHOLECYSTECTOMY  WITH INTRAOPERATIVE CHOLANGIOGRAM;  Surgeon: Jovita Kussmaul, MD;  Location: [facility];  Service: General;  Laterality: N/A;   COLONOSCOPY     DILATATION & CURETTAGE/HYSTEROSCOPY WITH MYOSURE N/A 10/29/2014   Procedure: DILATATION & CURETTAGE/HYSTEROSCOPY WITH MYOSURE;  Surgeon: Paula Compton, MD;  Location: [facility];  Service: Gynecology;  Laterality: N/A;   IR FLUORO GUIDED NEEDLE PLC ASPIRATION/INJECTION LOC  06/10/2016   IR FLUORO GUIDED NEEDLE PLC ASPIRATION/INJECTION LOC  06/10/2016   KYPHOPLASTY N/A 05/14/2016   Procedure: T 11  KYPHOPLASTY;  Surgeon: Phylliss Bob, MD;  Location: [place];  Service: Orthopedics;  Laterality: N/A;  T 11 KYPHOPLASTY   KYPHOPLASTY N/A 06/18/2016   Procedure: THORACIC 12 KYPHOPLASTY;  Surgeon: Phylliss Bob, MD;  Location: [place];  Service: Orthopedics;  Laterality: N/A;  THORACIC 12 KYPHOPLASTY; REQUEST 1 HOUR AND FLIP ROOM   MOUTH SURGERY     NASAL SINUS SURGERY     NM MYOCAR PERF WALL MOTION  04/27/2006   normal   US ECHOCARDIOGRAPHY  12/25/2010   normal   VARICOSE VEIN SURGERY      Her Family History Is Significant For: Family History  Problem Relation Hickman of Onset   Asthma Mother    Arthritis Mother    Depression Mother    Hypertension Mother    Tuberculosis Mother    Emphysema Father    Stroke Father    Alcohol abuse Father    Heart attack Father    Lung cancer Father    COPD Brother    Colon cancer Neg Hx    Esophageal cancer Neg Hx    Stomach cancer Neg Hx    Pancreatic cancer Neg Hx    Sleep apnea Neg Hx     Her Social History Is Significant For: Social History   Socioeconomic History   Marital status: Married    Spouse name: Richard   Number of children: 2   Years of education: Grad   Highest education level: Not on file  Occupational History   Occupation: Engineer, manufacturing systems: RETIRED  Tobacco Use   Smoking status: Never   Smokeless tobacco: Never   Tobacco comments:    never used tobacco  Vaping Use   Vaping Use: Never used  Substance and Sexual Activity   Alcohol use: Yes    Alcohol/week: 14.0 standard drinks of alcohol    Types: 14 Glasses of wine per week    Comment: occasionally   Drug use: No   Sexual activity: Not on file  Other Topics Concern   Not on file  Social History Narrative   Health Care POA:    Emergency Contact: husband, Francene Finders, (c) [phone]   End of Life Plan:    Who lives with you: husband   Any pets: none   Diet: Pt has a varied diet.  Eats 5 sm. meals throughout day, focuses on protein, doesn't care  for fruits and vegetables very much.   Exercise: Pt has a personal training and exercises several times a week.   Seatbelts: Pt reports wearing seatbelt when in vehicle.   Nancy Fetter Exposure/Protection: Pt reports wearing sun protection.    Hobbies: reading, visiting with friends   Patient has a Scientist, water quality.   Patient has two children.   Patient is retired.   Patient does not drink any caffeine.   Patient is right handed.         Social Determinants of Health   Financial Resource Strain: Not on file  Food Insecurity: Not on file  Transportation Needs: Not on file  Physical Activity: Not on file  Stress: Not on file  Social Connections: Not on file    Her Allergies Are:  Allergies  Allergen Reactions   Dulera [Mometasone Furo-Formoterol Fum] Cough   Nitrofurantoin Macrocrystal Itching  :   Her Current Medications Are:  Outpatient Encounter Medications as of 04/16/2022  Medication Sig   CRANBERRY PO Take by mouth.   folic acid (FOLVITE) 1 MG tablet TAKE 1 TABLET(1 MG) BY MOUTH DAILY   LORazepam (ATIVAN) 0.5 MG tablet TAKE 1 TABLET BY MOUTH EVERY NIGHT AT BEDTIME (Patient taking differently: Take 0.5 mg by mouth at bedtime.)   Melatonin 10 MG TABS Take 10 mg by mouth at bedtime as needed.   pantoprazole (PROTONIX) 40 MG tablet Take 40 mg by mouth daily.   propranolol (INDERAL) 20 MG tablet Take 20 mg by mouth daily.   rivaroxaban (XARELTO) 20 MG TABS tablet TAKE 1 TABLET DAILY WITH SUPPER   Tiotropium Bromide-Olodaterol (STIOLTO RESPIMAT) 2.5-2.5 MCG/ACT AERS Inhale 2 puffs into the lungs daily.   Vitamin D, Ergocalciferol, (DRISDOL) 50000 units CAPS capsule Take 1 capsule (50,000 Units total) by mouth every 7 (seven) days. Sundays   No facility-administered encounter medications on file as of 04/16/2022.  :   Review of Systems:  Out of a complete 14 point review of systems, all are reviewed and negative with the exception of these symptoms as listed below:  Review of  Systems  Neurological:        Pt here for sleep consult  Pt snore,fatigue, pt states has nightmares while sleeping. Pt denies hypertension,headaches ,sleep study,CPAP machine   ESS:6 FSS:61    Objective:  Neurological Exam  Physical Exam Physical Examination:   Vitals:   04/16/22 1403  BP: 135/73  Pulse: 73    General Examination: The patient is a very pleasant 77 y.o. female in no acute distress. She appears well-developed and well-nourished and well groomed.   HEENT: Normocephalic, atraumatic, pupils are equal, round and reactive to light, extraocular tracking is good without limitation to gaze excursion or nystagmus noted. Hearing is grossly intact. Face is symmetric with normal facial animation. Speech is clear with no dysarthria noted. There is no hypophonia. There is no lip, neck/head, jaw or voice tremor. Neck is supple with full range of passive and active motion. There are no carotid bruits on auscultation. Oropharynx exam reveals: mild mouth dryness, adequate dental hygiene with bridges in place, and moderate airway crowding, due to smaller airway entry. Mallampati is class III. Tongue protrudes centrally and palate elevates symmetrically. Tonsils are smaller. Neck size is 14.75 inches. She has a minimal overbite.   Chest: Clear to auscultation without wheezing, rhonchi or crackles noted.  Heart: S1+S2+0, regular and normal without murmurs, rubs or gallops noted.   Abdomen: Soft, non-tender and non-distended.  Extremities: There is no pitting edema in the distal lower extremities bilaterally.   Skin: Warm and dry without trophic changes noted.   Musculoskeletal: exam reveals no obvious joint deformities.   Neurologically:  Mental status: The patient is awake, alert and oriented in all 4 spheres. Her immediate and remote memory, attention, language skills and fund of knowledge are appropriate. There is no evidence of aphasia, agnosia, apraxia or anomia. Speech is clear  with normal prosody and enunciation. Thought process is linear. Mood is normal and affect is normal.  Cranial nerves II - XII are as described above under HEENT exam.  Motor exam: Normal bulk, strength and tone is noted. There is no obvious action or resting tremor.  Fine motor skills and coordination: grossly intact.  Cerebellar testing: No dysmetria or intention tremor. There is no truncal or gait ataxia.  Sensory exam: intact to light touch in the upper and lower extremities.  Gait, station and balance: She stands with mild difficulty and uses a walker.   Assessment and Plan:   In summary, ALY CALIFF is a very pleasant 77 y.o.-year old female with an underlying complex medical history of allergies, asthma, anemia, chronic kidney disease, depression, anxiety, reflux disease, coronary artery disease with history of heart attack, irritable bowel syndrome, migraine headaches, mitochondrial myopathy, osteoporosis, polycythemia, hereditary hemochromatosis, DVT, PVCs, vitamin D deficiency, thoracic compression fracture, chronic kidney disease, and cholelithiasis, whose history and physical exam are concerning for sleep disordered breathing, particularly obstructive sleep apnea (OSA). While a laboratory attended sleep study is typically considered "gold standard" for evaluation of sleep disordered breathing, she would prefer a home sleep test.   I had a long chat with the patient and her husband about my findings and the diagnosis of sleep apnea, particularly OSA, its prognosis and treatment options. We talked about medical/conservative treatments, surgical interventions and non-pharmacological approaches for symptom control. I explained, in particular, the risks and ramifications of untreated moderate to severe OSA, especially with respect to developing cardiovascular disease down the road, including congestive heart failure (CHF), difficult to treat hypertension, cardiac arrhythmias (particularly  A-fib), neurovascular complications including TIA, stroke and dementia. Even type 2 diabetes has, in part, been linked to untreated OSA. Symptoms of untreated OSA may include (but may not be limited to) daytime sleepiness, nocturia (i.e. frequent nighttime urination), memory problems, mood irritability and suboptimally controlled or worsening mood disorder such as depression and/or anxiety, lack of energy, lack of motivation, physical discomfort, as well as recurrent headaches, especially morning or nocturnal headaches. We talked about the importance of maintaining a healthy lifestyle and striving for healthy weight. In addition, we talked about the importance of striving for and maintaining good sleep hygiene.  She is advised to avoid watching TV up until it is time to fall asleep.  She has a history of nightmares.  She is furthermore advised that alcohol can be a significant sleep disrupter, she is advised to avoid drinking alcohol daily and limit herself to less than 1 serving per day on average. I recommended a sleep study at this time. I outlined the differences between a laboratory attended sleep study which is considered more comprehensive and accurate over the option of a home sleep test (HST); the latter may lead to underestimation of sleep disordered breathing in some instances and does not help with diagnosing upper airway resistance syndrome and is not accurate enough to diagnose primary central sleep apnea typically. I outlined possible surgical and non-surgical treatment options of OSA, including the use of a positive airway pressure (PAP) device (i.e. CPAP, AutoPAP/APAP or BiPAP in certain circumstances), a custom-made dental device (aka oral appliance, which would require a referral to a specialist dentist or orthodontist typically, and is generally speaking not considered for patients with full dentures or edentulous state), upper airway surgical options, such as traditional UPPP (which is not  considered a first-line treatment) or the Inspire device (hypoglossal nerve stimulator, which would involve a referral for consultation with an ENT surgeon, after careful selection, following inclusion criteria - also not first-line treatment). I explained the PAP treatment option to the patient in detail,  as this is generally considered first-line treatment.  The patient indicated that she would be willing to try PAP therapy, if the need arises. I explained the importance of being compliant with PAP treatment, not only for insurance purposes but primarily to improve patient's symptoms symptoms, and for the patient's long term health benefit, including to reduce Her cardiovascular risks longer-term.    We will pick up our discussion about the next steps and treatment options after testing.  We will keep them posted as to the test results by phone call and/or MyChart messaging where possible.  We will plan to follow-up in sleep clinic accordingly as well.  I answered all their questions today and the patient and her husband were in agreement.   I encouraged them to call with any interim questions, concerns, problems or updates or email [place] through [place].  Generally speaking, sleep test authorizations may take up to 2 weeks, sometimes less, sometimes longer, the patient is encouraged to get in touch with [place] if they do not hear back from the sleep lab staff directly within the next 2 weeks.  Thank you very much for allowing me to participate in the care of this nice patient. If I can be of any further assistance to you please do not hesitate to call me at [phone].  Sincerely,   Faith Age, MD, PhD

## 2022-04-16 NOTE — Patient Instructions (Signed)

## 2022-04-22 ENCOUNTER — Telehealth: Payer: Self-pay | Admitting: Neurology

## 2022-04-22 NOTE — Telephone Encounter (Signed)
HST- Medicare/aarp no Josem Kaufmann req [place] (patient husband Francene Finders is picking up for himself and her)   Patient is scheduled at [facility] for 05/19/22 at 8:30 AM,  Mailed packet to the patient.

## 2022-04-24 DIAGNOSIS — M47816 Spondylosis without myelopathy or radiculopathy, lumbar region: Secondary | ICD-10-CM | POA: Diagnosis not present

## 2022-05-08 DIAGNOSIS — M47816 Spondylosis without myelopathy or radiculopathy, lumbar region: Secondary | ICD-10-CM | POA: Diagnosis not present

## 2022-05-11 ENCOUNTER — Telehealth: Payer: Self-pay | Admitting: Hematology and Oncology

## 2022-05-19 ENCOUNTER — Ambulatory Visit: Payer: Medicare Other | Admitting: Neurology

## 2022-05-19 DIAGNOSIS — Z9189 Other specified personal risk factors, not elsewhere classified: Secondary | ICD-10-CM

## 2022-05-19 DIAGNOSIS — R351 Nocturia: Secondary | ICD-10-CM

## 2022-05-19 DIAGNOSIS — R0683 Snoring: Secondary | ICD-10-CM

## 2022-05-19 DIAGNOSIS — D751 Secondary polycythemia: Secondary | ICD-10-CM

## 2022-05-19 DIAGNOSIS — R519 Headache, unspecified: Secondary | ICD-10-CM

## 2022-05-22 DIAGNOSIS — M47816 Spondylosis without myelopathy or radiculopathy, lumbar region: Secondary | ICD-10-CM | POA: Diagnosis not present

## 2022-05-26 DIAGNOSIS — D510 Vitamin B12 deficiency anemia due to intrinsic factor deficiency: Secondary | ICD-10-CM | POA: Diagnosis not present

## 2022-05-29 DIAGNOSIS — M47816 Spondylosis without myelopathy or radiculopathy, lumbar region: Secondary | ICD-10-CM | POA: Diagnosis not present

## 2022-06-02 DIAGNOSIS — M47816 Spondylosis without myelopathy or radiculopathy, lumbar region: Secondary | ICD-10-CM | POA: Diagnosis not present

## 2022-06-02 NOTE — Telephone Encounter (Signed)
HST- Medicare/aarp no auth req; repeat HST from 05/19/22   Patient is scheduled at [facility] for 06/17/22 at 8:30 AM  Mailed new packet to the patient.

## 2022-06-09 DIAGNOSIS — M47816 Spondylosis without myelopathy or radiculopathy, lumbar region: Secondary | ICD-10-CM | POA: Diagnosis not present

## 2022-06-15 DIAGNOSIS — M47816 Spondylosis without myelopathy or radiculopathy, lumbar region: Secondary | ICD-10-CM | POA: Diagnosis not present

## 2022-06-17 ENCOUNTER — Ambulatory Visit (INDEPENDENT_AMBULATORY_CARE_PROVIDER_SITE_OTHER): Payer: Medicare Other | Admitting: Neurology

## 2022-06-17 DIAGNOSIS — R519 Headache, unspecified: Secondary | ICD-10-CM

## 2022-06-17 DIAGNOSIS — R0683 Snoring: Secondary | ICD-10-CM

## 2022-06-17 DIAGNOSIS — G4733 Obstructive sleep apnea (adult) (pediatric): Secondary | ICD-10-CM | POA: Diagnosis not present

## 2022-06-17 DIAGNOSIS — R351 Nocturia: Secondary | ICD-10-CM

## 2022-06-17 DIAGNOSIS — D751 Secondary polycythemia: Secondary | ICD-10-CM

## 2022-06-17 DIAGNOSIS — Z9189 Other specified personal risk factors, not elsewhere classified: Secondary | ICD-10-CM

## 2022-06-18 NOTE — Progress Notes (Signed)
See procedure note.

## 2022-06-23 ENCOUNTER — Other Ambulatory Visit: Payer: Self-pay | Admitting: Neurology

## 2022-06-23 DIAGNOSIS — G4733 Obstructive sleep apnea (adult) (pediatric): Secondary | ICD-10-CM

## 2022-06-23 NOTE — Procedures (Signed)
[facility]  HOME SLEEP TEST (Watch PAT) REPORT  STUDY DATE: 06/17/2022  DOB: 12-Jun-1945  MRN: [id number]  ORDERING CLINICIAN: Huston Foley, MD, PhD   REFERRING CLINICIAN: Alysia Penna, MD   CLINICAL INFORMATION/HISTORY: 77 year old female with an underlying complex medical history of allergies, asthma, anemia, chronic kidney disease, depression, anxiety, reflux disease, coronary artery disease with history of heart attack, irritable bowel syndrome, migraine headaches, mitochondrial myopathy, osteoporosis, polycythemia, hereditary hemochromatosis, DVT, PVCs, vitamin D deficiency, thoracic compression fracture, chronic kidney disease, and cholelithiasis, who reports snoring and excessive daytime somnolence.   Epworth sleepiness score: 6/24.  BMI: 25.4 kg/m  FINDINGS:   Sleep Summary:   Total Recording Time (hours, min): 8 hours, 6 min  Total Sleep Time (hours, min):  7 hours, 18 min  Percent REM (%):    26.2%   Respiratory Indices:   Calculated pAHI (per hour):  13.8/hour  -   utilizing the 4% desaturation criteria for hypopneas per Medicare guidelines         REM pAHI:    24.6/hour       NREM pAHI: 10.1/hour  Central pAHI: 0.8/hour  Oxygen Saturation Statistics:    Oxygen Saturation (%) Mean: 92%   Minimum oxygen saturation (%):                 85%   O2 Saturation Range (%): 85 - 98%    O2 Saturation (minutes) <=88%: 6.3 min  Pulse Rate Statistics:   Pulse Mean (bpm):    81/min    Pulse Range (70 - 96/min)   IMPRESSION: OSA (obstructive sleep apnea) Nocturnal hypoxemia  RECOMMENDATION:  This home sleep test demonstrates overall mild obstructive sleep apnea with a total AHI of 13.8/hour, utilizing the 4% desaturation criteria for hypopneas per Medicare guidelines.  O2 nadir was 85% with time below or at 88% saturation of over 6 minutes for the night.  This indicates a mild degree of nocturnal hypoxemia.  Snoring was noted fairly  consistently throughout the night in the mild to moderate range. Given the patient's medical history and sleep related complaints, therapy with a  positive airway pressure device is a reasonable first-line choice and clinically recommended. Treatment can be achieved in the form of autoPAP trial/titration at home for now. A full night, in-lab PAP titration study may aid in improving proper treatment settings and with mask fit, if needed, down the road. Alternative treatments may include weight loss (where appropriate) along with avoidance of the supine sleep position (if possible), or an oral appliance in appropriate candidates.   Please note that untreated obstructive sleep apnea may carry additional perioperative morbidity. Patients with significant obstructive sleep apnea should receive perioperative PAP therapy and the surgeons and particularly the anesthesiologist should be informed of the diagnosis and the severity of the sleep disordered breathing. The patient should be cautioned not to drive, work at heights, or operate dangerous or heavy equipment when tired or sleepy. Review and reiteration of good sleep hygiene measures should be pursued with any patient. Other causes of the patient's symptoms, including circadian rhythm disturbances, an underlying mood disorder, medication effect and/or an underlying medical problem cannot be ruled out based on this test. Clinical correlation is recommended.  The patient and her referring provider will be notified of the test results. The patient will be seen in follow up in sleep clinic at [facility], as necessary.  I certify that I have reviewed the raw data recording prior to the issuance of this report  in accordance with the standards of the American Academy of Sleep Medicine (AASM).  INTERPRETING PHYSICIAN:   Huston Foley, MD, PhD Medical Director, [facility]) Diplomat, ABPN (Neurology and Sleep)   [facility] [address] [place], [place] [address] [phone]

## 2022-06-24 ENCOUNTER — Telehealth: Payer: Self-pay | Admitting: *Deleted

## 2022-06-24 NOTE — Telephone Encounter (Signed)
-----   Message from Huston Foley, MD sent at 06/23/2022  4:59 PM EDT ----- Patient referred by Dr. Link Snuffer, seen by me on 04/16/2022, HST 06/17/2022.    You may be able to call about her husband's Harvie Heck) results at the same time, as they both have their clinic appointment and their HSTs on the same dates.  Please call and notify the patient that the recent home sleep test showed obstructive sleep apnea. OSA is overall mild, but she did have some desaturations into the mid 80s.  I would recommend treatment in the form of autoPAP, which means, that we don't have to bring her in for a sleep study with CPAP, but will let her try an autoPAP machine at home, through a DME company (of her choice, or as per insurance requirement). The DME representative will educate her on how to use the machine, how to put the mask on, etc. I have placed an order in the chart. Please send referral, talk to patient, send report to referring MD. We will need a FU in sleep clinic for 10 weeks post-PAP set up, please arrange that with me or one of our NPs. Thanks,   Huston Foley, MD, PhD [facility])

## 2022-06-24 NOTE — Telephone Encounter (Signed)
Spoke to patient gave sleep study results Pt chose [facility] for DME  Pt aware of insurance compliance Gave pt [employer] # Made pt f/u appointment with Dr Frances Furbish 08/2022  Faxed over orders to [facility] today and  routed sleep study results to referring  MD this am Pt thanked me for calling

## 2022-06-25 DIAGNOSIS — M47816 Spondylosis without myelopathy or radiculopathy, lumbar region: Secondary | ICD-10-CM | POA: Diagnosis not present

## 2022-06-30 ENCOUNTER — Other Ambulatory Visit: Payer: Self-pay | Admitting: Hematology and Oncology

## 2022-06-30 DIAGNOSIS — M47816 Spondylosis without myelopathy or radiculopathy, lumbar region: Secondary | ICD-10-CM | POA: Diagnosis not present

## 2022-06-30 DIAGNOSIS — D510 Vitamin B12 deficiency anemia due to intrinsic factor deficiency: Secondary | ICD-10-CM

## 2022-07-01 ENCOUNTER — Other Ambulatory Visit: Payer: Medicare Other

## 2022-07-02 ENCOUNTER — Other Ambulatory Visit: Payer: Self-pay

## 2022-07-02 ENCOUNTER — Encounter: Payer: Self-pay | Admitting: Hematology and Oncology

## 2022-07-02 ENCOUNTER — Inpatient Hospital Stay: Payer: Medicare Other | Attending: Hematology and Oncology

## 2022-07-02 DIAGNOSIS — D510 Vitamin B12 deficiency anemia due to intrinsic factor deficiency: Secondary | ICD-10-CM

## 2022-07-02 DIAGNOSIS — D45 Polycythemia vera: Secondary | ICD-10-CM | POA: Insufficient documentation

## 2022-07-02 LAB — VITAMIN B12: Vitamin B-12: 319 pg/mL (ref 180–914)

## 2022-07-07 DIAGNOSIS — M47816 Spondylosis without myelopathy or radiculopathy, lumbar region: Secondary | ICD-10-CM | POA: Diagnosis not present

## 2022-07-08 ENCOUNTER — Inpatient Hospital Stay: Payer: Medicare Other | Admitting: Hematology and Oncology

## 2022-07-08 ENCOUNTER — Other Ambulatory Visit: Payer: Self-pay | Admitting: Hematology and Oncology

## 2022-07-08 DIAGNOSIS — D45 Polycythemia vera: Secondary | ICD-10-CM

## 2022-07-08 LAB — METHYLMALONIC ACID, SERUM: Methylmalonic Acid, Quantitative: 323 nmol/L (ref 0–378)

## 2022-07-08 NOTE — Progress Notes (Signed)
Rescheduled

## 2022-07-09 DIAGNOSIS — M47816 Spondylosis without myelopathy or radiculopathy, lumbar region: Secondary | ICD-10-CM | POA: Diagnosis not present

## 2022-07-09 DIAGNOSIS — Z6825 Body mass index (BMI) 25.0-25.9, adult: Secondary | ICD-10-CM | POA: Diagnosis not present

## 2022-07-15 ENCOUNTER — Encounter: Payer: Self-pay | Admitting: Hematology and Oncology

## 2022-07-19 ENCOUNTER — Encounter: Payer: Self-pay | Admitting: Hematology and Oncology

## 2022-07-21 ENCOUNTER — Telehealth: Payer: Self-pay | Admitting: Hematology and Oncology

## 2022-07-21 DIAGNOSIS — M47816 Spondylosis without myelopathy or radiculopathy, lumbar region: Secondary | ICD-10-CM | POA: Diagnosis not present

## 2022-07-22 ENCOUNTER — Inpatient Hospital Stay: Payer: Medicare Other

## 2022-07-22 ENCOUNTER — Other Ambulatory Visit: Payer: Self-pay

## 2022-07-22 ENCOUNTER — Inpatient Hospital Stay (HOSPITAL_BASED_OUTPATIENT_CLINIC_OR_DEPARTMENT_OTHER): Payer: Medicare Other | Admitting: Hematology and Oncology

## 2022-07-22 ENCOUNTER — Other Ambulatory Visit: Payer: Self-pay | Admitting: *Deleted

## 2022-07-22 VITALS — BP 152/69 | HR 80 | Temp 97.0°F | Resp 16 | Wt 143.2 lb

## 2022-07-22 DIAGNOSIS — M8588 Other specified disorders of bone density and structure, other site: Secondary | ICD-10-CM

## 2022-07-22 DIAGNOSIS — D45 Polycythemia vera: Secondary | ICD-10-CM | POA: Diagnosis not present

## 2022-07-22 DIAGNOSIS — M859 Disorder of bone density and structure, unspecified: Secondary | ICD-10-CM | POA: Diagnosis not present

## 2022-07-22 DIAGNOSIS — I2699 Other pulmonary embolism without acute cor pulmonale: Secondary | ICD-10-CM

## 2022-07-22 LAB — CMP (CANCER CENTER ONLY)
ALT: 28 U/L (ref 0–44)
AST: 33 U/L (ref 15–41)
Albumin: 3.5 g/dL (ref 3.5–5.0)
Alkaline Phosphatase: 81 U/L (ref 38–126)
Anion gap: 6 (ref 5–15)
BUN: 11 mg/dL (ref 8–23)
CO2: 28 mmol/L (ref 22–32)
Calcium: 9.3 mg/dL (ref 8.9–10.3)
Chloride: 101 mmol/L (ref 98–111)
Creatinine: 0.56 mg/dL (ref 0.44–1.00)
GFR, Estimated: >60 mL/min (ref >60)
Glucose, Bld: 94 mg/dL (ref 70–99)
Potassium: 4.4 mmol/L (ref 3.5–5.1)
Sodium: 135 mmol/L (ref 135–145)
Total Bilirubin: 1.2 mg/dL (ref 0.3–1.2)
Total Protein: 6.9 g/dL (ref 6.5–8.1)

## 2022-07-22 LAB — CBC WITH DIFFERENTIAL (CANCER CENTER ONLY)
Abs Immature Granulocytes: 0.04 10*3/uL (ref 0.00–0.07)
Basophils Absolute: 0.1 10*3/uL (ref 0.0–0.1)
Basophils Relative: 1 %
Eosinophils Absolute: 0.1 10*3/uL (ref 0.0–0.5)
Eosinophils Relative: 2 %
HCT: 43.1 % (ref 36.0–46.0)
Hemoglobin: 15.4 g/dL — ABNORMAL HIGH (ref 12.0–15.0)
Immature Granulocytes: 1 %
Lymphocytes Relative: 25 %
Lymphs Abs: 1.5 10*3/uL (ref 0.7–4.0)
MCH: 34.8 pg — ABNORMAL HIGH (ref 26.0–34.0)
MCHC: 35.7 g/dL (ref 30.0–36.0)
MCV: 97.3 fL (ref 80.0–100.0)
Monocytes Absolute: 0.5 10*3/uL (ref 0.1–1.0)
Monocytes Relative: 9 %
Neutro Abs: 3.8 10*3/uL (ref 1.7–7.7)
Neutrophils Relative %: 62 %
Platelet Count: 125 10*3/uL — ABNORMAL LOW (ref 150–400)
RBC: 4.43 MIL/uL (ref 3.87–5.11)
RDW: 12.6 % (ref 11.5–15.5)
WBC Count: 6 10*3/uL (ref 4.0–10.5)
nRBC: 0 % (ref 0.0–0.2)

## 2022-07-22 LAB — IRON AND IRON BINDING CAPACITY (CC-WL,HP ONLY)
Iron: 161 ug/dL (ref 28–170)
Saturation Ratios: 45 % — ABNORMAL HIGH (ref 10.4–31.8)
TIBC: 358 ug/dL (ref 250–450)
UIBC: 197 ug/dL (ref 148–442)

## 2022-07-22 LAB — FERRITIN: Ferritin: 27 ng/mL (ref 11–307)

## 2022-07-22 LAB — VITAMIN B12: Vitamin B-12: 415 pg/mL (ref 180–914)

## 2022-07-22 NOTE — Progress Notes (Signed)
[facility] [phone]   [phone]  PROGRESS NOTE  Patient Care Team: Alysia Penna, MD as PCP - General (Internal Medicine) Luellen Pucker (Dentistry)  Hematological/Oncological History # Polycythemia, JAK2 negative # Hereditary Hemochromatosis (H63D heterozygote mutation)  # Unprovoked Pulmonary Embolism  12/02/2021: last visit with [name] at [facility] 01/29/2022: transition care to Dr. [name]   Interval History:  [name] 77 y.o. female with medical history significant for polycythemia (JAK2 negative) and Hereditary Hemochromatosis (H63D heterozygote mutation) who presents for a follow up visit. The patient's last visit was on 01/29/2022. In the interim since the last visit she has had no major changes in her health.  On exam today [name] reports he has been well overall in the interim since her last visit.  She does report that fatigue does continue to be her primary issue.  She does have some occasional bouts of night sweats.  She notes that she is also concerned about her bone density and has requested we consider starting Zometa shots.  She has not recently had a DEXA scan.  Overall her health has been steady and she continues to faithfully take her Xarelto.  She denies any bleeding, bruising, or dark stools.  She otherwise denies any fevers, chills, sweats, nausea, vomiting or diarrhea.  A full 10 point ROS was otherwise negative.  MEDICAL HISTORY:  Past Medical History:  Diagnosis Date   Allergy    Anemia    Anxiety    Asthma    DR. BYRUM   Blood transfusion without reported diagnosis    Cataract    Cholelithiasis    Chronic kidney disease    gallstones    Compression fracture of body of thoracic vertebra (HCC)    Depression    Dyspnea    Dysrhythmia    palpitations   GERD (gastroesophageal reflux disease)    Heart attack (HCC)    Hemorrhoids    IBS (irritable bowel syndrome)    Iliotibial band  syndrome    left knee   Migraine    Mitochondrial myopathy    Normal coronary arteries    by cardiac catheterization 2003   Osteoporosis    Pericarditis    age 66   Pneumothorax    Polycythemia vera(238.4) 06/17/2012   PVC's (premature ventricular contractions)    Vitamin D deficiency     SURGICAL HISTORY: Past Surgical History:  Procedure Laterality Date   APPENDECTOMY     CARDIAC CATHETERIZATION     CATARACT EXTRACTION W/ INTRAOCULAR LENS  IMPLANT, BILATERAL     CESAREAN SECTION     x 1   CHOLECYSTECTOMY N/A 06/05/2021   Procedure: LAPAROSCOPIC CHOLECYSTECTOMY WITH INTRAOPERATIVE CHOLANGIOGRAM;  Surgeon: Griselda Miner, MD;  Location: [facility];  Service: General;  Laterality: N/A;   COLONOSCOPY     DILATATION & CURETTAGE/HYSTEROSCOPY WITH MYOSURE N/A 10/29/2014   Procedure: DILATATION & CURETTAGE/HYSTEROSCOPY WITH MYOSURE;  Surgeon: Huel Cote, MD;  Location: [facility];  Service: Gynecology;  Laterality: N/A;   IR FLUORO GUIDED NEEDLE PLC ASPIRATION/INJECTION LOC  06/10/2016   IR FLUORO GUIDED NEEDLE PLC ASPIRATION/INJECTION LOC  06/10/2016   KYPHOPLASTY N/A 05/14/2016   Procedure: T 11 KYPHOPLASTY;  Surgeon: Estill Bamberg, MD;  Location: [facility];  Service: Orthopedics;  Laterality: N/A;  T 11 KYPHOPLASTY   KYPHOPLASTY N/A 06/18/2016   Procedure: THORACIC 12 KYPHOPLASTY;  Surgeon: Estill Bamberg, MD;  Location: [facility];  Service: Orthopedics;  Laterality: N/A;  THORACIC 12  KYPHOPLASTY; REQUEST 1 HOUR AND FLIP ROOM   MOUTH SURGERY     NASAL SINUS SURGERY     NM MYOCAR PERF WALL MOTION  04/27/2006   normal   US ECHOCARDIOGRAPHY  12/25/2010   normal   VARICOSE VEIN SURGERY      SOCIAL HISTORY: Social History   Socioeconomic History   Marital status: Married    Spouse name: Richard   Number of children: 2   Years of education: Grad   Highest education level: Not on file  Occupational History   Occupation: Patent examiner: RETIRED  Tobacco Use   Smoking status:  Never   Smokeless tobacco: Never   Tobacco comments:    never used tobacco  Vaping Use   Vaping Use: Never used  Substance and Sexual Activity   Alcohol use: Yes    Alcohol/week: 14.0 standard drinks of alcohol    Types: 14 Glasses of wine per week    Comment: occasionally   Drug use: No   Sexual activity: Not on file  Other Topics Concern   Not on file  Social History Narrative   Health Care POA:    Emergency Contact: husband, Truman Hayward, (c) [phone]   End of Life Plan:    Who lives with you: husband   Any pets: none   Diet: Pt has a varied diet.  Eats 5 sm. meals throughout day, focuses on protein, doesn't care for fruits and vegetables very much.   Exercise: Pt has a personal training and exercises several times a week.   Seatbelts: Pt reports wearing seatbelt when in vehicle.   [name] Exposure/Protection: Pt reports wearing sun protection.    Hobbies: reading, visiting with friends   Patient has a Event organiser.   Patient has two children.   Patient is retired.   Patient does not drink any caffeine.   Patient is right handed.         Social Determinants of Health   Financial Resource Strain: Not on file  Food Insecurity: Not on file  Transportation Needs: Not on file  Physical Activity: Not on file  Stress: Not on file  Social Connections: Not on file  Intimate Partner Violence: Not on file    FAMILY HISTORY: Family History  Problem Relation Age of Onset   Asthma Mother    Arthritis Mother    Depression Mother    Hypertension Mother    Tuberculosis Mother    Emphysema Father    Stroke Father    Alcohol abuse Father    Heart attack Father    Lung cancer Father    COPD Brother    Colon cancer Neg Hx    Esophageal cancer Neg Hx    Stomach cancer Neg Hx    Pancreatic cancer Neg Hx    Sleep apnea Neg Hx     ALLERGIES:  is allergic to [employer] furo-formoterol fum], ciprofloxacin, and nitrofurantoin macrocrystal.  MEDICATIONS:  Current  Outpatient Medications  Medication Sig Dispense Refill   budesonide-formoterol (SYMBICORT) 80-4.5 MCG/ACT inhaler Inhale into the lungs.     CRANBERRY PO Take by mouth.     folic acid (FOLVITE) 1 MG tablet TAKE 1 TABLET(1 MG) BY MOUTH DAILY 30 tablet 11   LORazepam (ATIVAN) 0.5 MG tablet TAKE 1 TABLET BY MOUTH EVERY NIGHT AT BEDTIME (Patient taking differently: Take 0.5 mg by mouth at bedtime.) 30 tablet 0   Melatonin 10 MG TABS Take 10 mg by mouth at bedtime  as needed.     pantoprazole (PROTONIX) 40 MG tablet Take 40 mg by mouth daily.     propranolol (INDERAL) 20 MG tablet Take 20 mg by mouth daily.     rivaroxaban (XARELTO) 20 MG TABS tablet TAKE 1 TABLET DAILY WITH SUPPER 30 tablet 2   Tiotropium Bromide-Olodaterol (STIOLTO RESPIMAT) 2.5-2.5 MCG/ACT AERS Inhale 2 puffs into the lungs daily. 4 g 11   Vitamin D, Ergocalciferol, (DRISDOL) 50000 units CAPS capsule Take 1 capsule (50,000 Units total) by mouth every 7 (seven) days. Sundays 30 capsule 3   No current facility-administered medications for this visit.    REVIEW OF SYSTEMS:   Constitutional: ( - ) fevers, ( - )  chills , ( - ) night sweats Eyes: ( - ) blurriness of vision, ( - ) double vision, ( - ) watery eyes Ears, nose, mouth, throat, and face: ( - ) mucositis, ( - ) sore throat Respiratory: ( - ) cough, ( - ) dyspnea, ( - ) wheezes Cardiovascular: ( - ) palpitation, ( - ) chest discomfort, ( - ) lower extremity swelling Gastrointestinal:  ( - ) nausea, ( - ) heartburn, ( - ) change in bowel habits Skin: ( - ) abnormal skin rashes Lymphatics: ( - ) new lymphadenopathy, ( - ) easy bruising Neurological: ( - ) numbness, ( - ) tingling, ( - ) new weaknesses Behavioral/Psych: ( - ) mood change, ( - ) new changes  All other systems were reviewed with the patient and are negative.  PHYSICAL EXAMINATION:  Vitals:   07/22/22 1044  BP: (!) 152/69  Pulse: 80  Resp: 16  Temp: (!) 97 F (36.1 C)  SpO2: 99%    Filed Weights    07/22/22 1044  Weight: 143 lb 3.2 oz (65 kg)    GENERAL: Well-appearing elderly Caucasian, alert, no distress and comfortable SKIN: skin color, texture, turgor are normal, no rashes or significant lesions EYES: conjunctiva are pink and non-injected, sclera clear LUNGS: clear to auscultation and percussion with normal breathing effort HEART: regular rate & rhythm and no murmurs and no lower extremity edema Musculoskeletal: no cyanosis of digits and no clubbing  PSYCH: alert & oriented x 3, fluent speech NEURO: no focal motor/sensory deficits  LABORATORY DATA:  I have reviewed the data as listed    Latest Ref Rng & Units 07/22/2022   10:33 AM 01/29/2022    2:18 PM 12/02/2021    1:07 PM  CBC  WBC 4.0 - 10.5 K/uL 6.0  7.7  5.7   Hemoglobin 12.0 - 15.0 g/dL 16.1  09.6  04.5   Hematocrit 36.0 - 46.0 % 43.1  40.0  46.1   Platelets 150 - 400 K/uL 125  163  140        Latest Ref Rng & Units 07/22/2022   10:33 AM 01/29/2022    2:18 PM 12/02/2021    1:07 PM  CMP  Glucose 70 - 99 mg/dL 94  76  409   BUN 8 - 23 mg/dL 11  15  9    Creatinine 0.44 - 1.00 mg/dL 8.11  9.14  7.82   Sodium 135 - 145 mmol/L 135  138  135   Potassium 3.5 - 5.1 mmol/L 4.4  4.1  3.9   Chloride 98 - 111 mmol/L 101  103  97   CO2 22 - 32 mmol/L 28  28  27    Calcium 8.9 - 10.3 mg/dL 9.3  9.1  9.4   Total  Protein 6.5 - 8.1 g/dL 6.9  6.3  7.1   Total Bilirubin 0.3 - 1.2 mg/dL 1.2  1.3  1.3   Alkaline Phos 38 - 126 U/L 81  68  90   AST 15 - 41 U/L 33  27  30   ALT 0 - 44 U/L 28  31  23      No results found for: "MPROTEIN" Lab Results  Component Value Date   KAPLAMBRATIO 0.91 04/27/2016    RADIOGRAPHIC STUDIES: No results found.  ASSESSMENT & PLAN DANNETTA TILGHMAN 77 y.o. female with medical history significant for polycythemia (JAK2 negative) and Hereditary Hemochromatosis (H63D heterozygote mutation) who presents for a follow up visit.  # Polycythemia, Unclear Etiology --patient noted to be JAK2  negative. Will order full MPN panel to r/o other possible etiologies --Of note, bone marrow biopsy performed in 2014 did not show evidence of myeloproliferative neoplasm. --the patient has no driver mutation, there is not a clear indication for phlebotomy --patient is a non-smoker with no symptoms of sleep apnea --RTC in 6 months time.  # Bone Density -- Will order a DEXA bone density scan per patient request.  If she has osteopenia/osteoporosis would defer treatment to patient's primary care provider.  # Hereditary Hemochromatosis (H63D heterozygote mutation) --no indication for phlebotomy for an H63D heterozygote.  --continue to monitor   # History of Unprovoked Pulmonary Embolism --continue Xarelto 20 mg PO daily  --positive lupus anticoagulant panel (collect on anticoagulation). Negative antibodies. Low clinic suspicion on APS  No orders of the defined types were placed in this encounter.   All questions were answered. The patient knows to call the clinic with any problems, questions or concerns.  A total of more than 30 minutes were spent on this encounter with face-to-face time and non-face-to-face time, including preparing to see the patient, ordering tests and/or medications, counseling the patient and coordination of care as outlined above.   Ulysees Barns, MD Department of Hematology/Oncology [facility] Phone: [phone] Pager: [phone] Email: [email or web address]  07/30/2022 6:12 PM

## 2022-07-23 ENCOUNTER — Telehealth: Payer: Self-pay | Admitting: Hematology and Oncology

## 2022-07-27 DIAGNOSIS — M47816 Spondylosis without myelopathy or radiculopathy, lumbar region: Secondary | ICD-10-CM | POA: Diagnosis not present

## 2022-07-28 DIAGNOSIS — D510 Vitamin B12 deficiency anemia due to intrinsic factor deficiency: Secondary | ICD-10-CM | POA: Diagnosis not present

## 2022-07-28 DIAGNOSIS — M47816 Spondylosis without myelopathy or radiculopathy, lumbar region: Secondary | ICD-10-CM | POA: Diagnosis not present

## 2022-07-29 ENCOUNTER — Other Ambulatory Visit: Payer: Self-pay | Admitting: *Deleted

## 2022-07-29 ENCOUNTER — Telehealth: Payer: Self-pay | Admitting: *Deleted

## 2022-07-29 DIAGNOSIS — M859 Disorder of bone density and structure, unspecified: Secondary | ICD-10-CM

## 2022-07-29 DIAGNOSIS — Z780 Asymptomatic menopausal state: Secondary | ICD-10-CM

## 2022-07-29 NOTE — Telephone Encounter (Signed)
Received call from pt inquiring about a bone density exam she needs in order to get her Zometa. Asked pt where she has had these before. She states she does not remember.. Offered different locations to her. She prefers [facility]. Referral fax'd to them @ [phone] Told pt to expect a call from them in the next few days. She voiced understanding.

## 2022-07-30 ENCOUNTER — Encounter: Payer: Self-pay | Admitting: Hematology and Oncology

## 2022-07-31 ENCOUNTER — Encounter: Payer: Self-pay | Admitting: Hematology and Oncology

## 2022-08-04 ENCOUNTER — Other Ambulatory Visit: Payer: Self-pay | Admitting: Internal Medicine

## 2022-08-04 DIAGNOSIS — M47816 Spondylosis without myelopathy or radiculopathy, lumbar region: Secondary | ICD-10-CM | POA: Diagnosis not present

## 2022-08-04 DIAGNOSIS — Z1231 Encounter for screening mammogram for malignant neoplasm of breast: Secondary | ICD-10-CM

## 2022-08-07 ENCOUNTER — Telehealth: Payer: Self-pay | Admitting: *Deleted

## 2022-08-07 ENCOUNTER — Ambulatory Visit: Payer: Medicare Other

## 2022-08-07 NOTE — Telephone Encounter (Signed)
TCT patient to discuss her bone density scan and Zometa. Spoke with her. Advised that Dr. [name] cannot give her Zometa until she has a bone density scan. Pt states when she scheduled this scan she was told she could have in done in January 2025.This was at [facility] (at pt's request) Asked pt where she has gotten bone density scans done before. She states she has not had one done in 15 years. Advised that I would call [facility] to see if we can get both her mammogram and bone density done. Pt voiced understanding. TCT to [facility]. Appts made for 08/14/22 @ 3pm for both tests. TCT patient and spoke with her. Informed that her appts for both exams are now scheduled for 7/19/ 3pm. Advised not to have any metal buttons that day or take any calcium the day of and the day before her bone density. Advised not to out on any deodorant or powder under her arms or around breasts for the mammogram.  Pt voiced understanding. Pt states she does not take calcium.  TCT Dr. Alphonsus Sias office (pt's PCP) to discuss managing pt's maintenance mammogram/bone density and treatments related to bone density problems , if any are discovered. Left vm message for his CMA to call back to [phone] Pt is seen in this office for Hereditary Hemochromotosis

## 2022-08-07 NOTE — Telephone Encounter (Signed)
See previous note

## 2022-08-10 ENCOUNTER — Other Ambulatory Visit: Payer: Self-pay

## 2022-08-11 DIAGNOSIS — M47816 Spondylosis without myelopathy or radiculopathy, lumbar region: Secondary | ICD-10-CM | POA: Diagnosis not present

## 2022-08-14 DIAGNOSIS — N958 Other specified menopausal and perimenopausal disorders: Secondary | ICD-10-CM | POA: Diagnosis not present

## 2022-08-14 DIAGNOSIS — E349 Endocrine disorder, unspecified: Secondary | ICD-10-CM | POA: Diagnosis not present

## 2022-08-14 DIAGNOSIS — M8588 Other specified disorders of bone density and structure, other site: Secondary | ICD-10-CM | POA: Diagnosis not present

## 2022-08-14 DIAGNOSIS — Z1231 Encounter for screening mammogram for malignant neoplasm of breast: Secondary | ICD-10-CM | POA: Diagnosis not present

## 2022-08-18 DIAGNOSIS — M47816 Spondylosis without myelopathy or radiculopathy, lumbar region: Secondary | ICD-10-CM | POA: Diagnosis not present

## 2022-08-20 ENCOUNTER — Encounter: Payer: Self-pay | Admitting: Hematology and Oncology

## 2022-08-27 ENCOUNTER — Telehealth: Payer: Self-pay | Admitting: Hematology and Oncology

## 2022-08-27 DIAGNOSIS — R922 Inconclusive mammogram: Secondary | ICD-10-CM | POA: Diagnosis not present

## 2022-08-27 DIAGNOSIS — D510 Vitamin B12 deficiency anemia due to intrinsic factor deficiency: Secondary | ICD-10-CM | POA: Diagnosis not present

## 2022-08-27 DIAGNOSIS — N63 Unspecified lump in unspecified breast: Secondary | ICD-10-CM | POA: Diagnosis not present

## 2022-08-27 NOTE — Telephone Encounter (Signed)
Patient is wanting to get her scheduled for an infusion, We have let her know that her GP has to be the one to send the information and orders regarding this patient; patient has stated they will get in touch with their primary provider for this information.

## 2022-08-31 DIAGNOSIS — M47816 Spondylosis without myelopathy or radiculopathy, lumbar region: Secondary | ICD-10-CM | POA: Diagnosis not present

## 2022-09-01 ENCOUNTER — Other Ambulatory Visit (HOSPITAL_COMMUNITY): Payer: Self-pay

## 2022-09-01 DIAGNOSIS — R3915 Urgency of urination: Secondary | ICD-10-CM | POA: Diagnosis not present

## 2022-09-01 DIAGNOSIS — N3020 Other chronic cystitis without hematuria: Secondary | ICD-10-CM | POA: Diagnosis not present

## 2022-09-01 DIAGNOSIS — R350 Frequency of micturition: Secondary | ICD-10-CM | POA: Diagnosis not present

## 2022-09-07 ENCOUNTER — Encounter: Payer: Self-pay | Admitting: Hematology and Oncology

## 2022-09-07 DIAGNOSIS — H40021 Open angle with borderline findings, high risk, right eye: Secondary | ICD-10-CM | POA: Diagnosis not present

## 2022-09-07 DIAGNOSIS — H52203 Unspecified astigmatism, bilateral: Secondary | ICD-10-CM | POA: Diagnosis not present

## 2022-09-07 DIAGNOSIS — D3132 Benign neoplasm of left choroid: Secondary | ICD-10-CM | POA: Diagnosis not present

## 2022-09-07 DIAGNOSIS — Z961 Presence of intraocular lens: Secondary | ICD-10-CM | POA: Diagnosis not present

## 2022-09-07 DIAGNOSIS — H524 Presbyopia: Secondary | ICD-10-CM | POA: Diagnosis not present

## 2022-09-09 ENCOUNTER — Ambulatory Visit: Payer: Medicare Other | Admitting: Neurology

## 2022-09-10 DIAGNOSIS — M47816 Spondylosis without myelopathy or radiculopathy, lumbar region: Secondary | ICD-10-CM | POA: Diagnosis not present

## 2022-09-16 ENCOUNTER — Ambulatory Visit (HOSPITAL_COMMUNITY)
Admission: RE | Admit: 2022-09-16 | Discharge: 2022-09-16 | Disposition: A | Discharge status: Home / Self Care | Payer: Medicare Other | Source: Ambulatory Visit | Attending: Internal Medicine | Admitting: Internal Medicine

## 2022-09-16 DIAGNOSIS — M810 Age-related osteoporosis without current pathological fracture: Secondary | ICD-10-CM | POA: Diagnosis not present

## 2022-09-16 MED ORDER — DENOSUMAB 60 MG/ML ~~LOC~~ SOSY
PREFILLED_SYRINGE | SUBCUTANEOUS | Status: AC
  Filled 2022-09-16: qty 1

## 2022-09-16 MED ORDER — DENOSUMAB 60 MG/ML ~~LOC~~ SOSY
60.0000 mg | PREFILLED_SYRINGE | Freq: Once | SUBCUTANEOUS | Status: AC
  Administered 2022-09-16: 60 mg via SUBCUTANEOUS

## 2022-09-29 ENCOUNTER — Encounter: Payer: Self-pay | Admitting: Neurology

## 2022-09-29 ENCOUNTER — Ambulatory Visit (INDEPENDENT_AMBULATORY_CARE_PROVIDER_SITE_OTHER): Payer: Medicare Other | Admitting: Neurology

## 2022-09-29 VITALS — BP 128/71 | HR 74 | Ht 63.0 in | Wt 145.0 lb

## 2022-09-29 DIAGNOSIS — G4733 Obstructive sleep apnea (adult) (pediatric): Secondary | ICD-10-CM | POA: Diagnosis not present

## 2022-09-29 DIAGNOSIS — Z789 Other specified health status: Secondary | ICD-10-CM | POA: Diagnosis not present

## 2022-09-29 DIAGNOSIS — D510 Vitamin B12 deficiency anemia due to intrinsic factor deficiency: Secondary | ICD-10-CM | POA: Diagnosis not present

## 2022-09-29 NOTE — Progress Notes (Signed)
Subjective:    Patient ID: [name] is a 77 y.o. female.  HPI    Interim history:   [name] is a 77 year old female with an underlying complex medical history of allergies, asthma, anemia, chronic kidney disease, depression, anxiety, reflux disease, coronary artery disease with history of heart attack, irritable bowel syndrome, migraine headaches, mitochondrial myopathy, osteoporosis, polycythemia, hereditary hemochromatosis, DVT, PVCs, vitamin D deficiency, thoracic compression fracture, chronic kidney disease, and cholelithiasis, who presents for follow-up consultation of [name] obstructive sleep apnea after interim testing and starting home AutoPap therapy.  The patient is accompanied by [name] today (who also had an appointment today).  I first met [name] at the request of [name] primary care physician on 04/16/2022, at which time [name] reported snoring and daytime somnolence.  [name] was advised to proceed with a sleep study.  [name] had a home sleep test through our office on 06/17/2022 which showed an AHI of 13.8/h, O2 nadir 85%.  Mild to moderate snoring was detected.  [name] was advised to proceed with home AutoPap therapy.  [name] set up date was 07/06/2022, [name] has a ResMed air sense 11 AutoSet machine.  [name] DME provider is [facility].   Today, 09/29/2022: I reviewed [name] AutoPap compliance data from the past 30 days, [name] used [name] machine 14 out of 30 days for an average of 4 hours and 37 minutes for days on treatment.  [name] has not used [name] machine since 09/17/2022, [name] has been struggling with the mask.  Percent use days greater than 4 hours is less than 40% at this time, in the first month [name] compliance was less.  [name] had reasonable usage in the month of July through early August with percent use days greater than 4 hours at 50% at the time.  [name] reports struggling with the interface, [name] has tried 3 different mask, including nasal pillows with a chinstrap and a nasal mask from [employer] with a  chinstrap and another mask in the beginning which [name] does not recall.  [name] would like to try the Respironics DreamWear nasal cushion interface, [name] was able to trial this with a friend's mask and [name] liked it.  [name] has had pain in the face around the nose area and the jaw area.  [name] has had trouble tolerating the interface, tried loosening the headgear and tightening it, neither approach has helped, [name] is quite frustrated and does not feel that [name] received enough support and help from the DME representative.  [name] would like to be able to continue treatment especially in light of [name] COPD diagnosis, history of DVT and hereditary hemochromatosis.    The patient's allergies, current medications, family history, past medical history, past social history, past surgical history and problem list were reviewed and updated as appropriate.    Previously:   04/16/22: ([name]) reports snoring and excessive daytime somnolence.  [name] Epworth sleepiness score is 6 out of 24, fatigue severity score is 61/63. I reviewed your office records from 03/18/2022, [name] saw Ronney Lion, NP at the time.  [name] generally goes to bed between 8 and 9.  [name] does watch TV in [name] bed, turned off the TV somewhere between 8:30 PM and 8:45 PM typically.  They do not have any pets in the household, [name] lives with [name].  [name] has nocturia about 2-3 times per average night, denies recurrent morning headaches but occasionally has woken up with a sinus pressure and congestion, [name] has a history of chronic sinus  issues.  [name] has a history of vivid dreams and chronic nightmares, since childhood essentially.  [name] has had nocturnal cough, [name] reports that this has improved since [name] started a PPI.  [name] drinks caffeine in the form of green tea, usually 1 cup/day.  [name] drinks alcohol daily in the form of wine, typically 2 glasses.  [name] is a non-smoker.  [name] has not reduced [name] alcohol consumption and has not been advised to reduce [name] alcohol  consumption per [name] report.  [name] hematologist has encouraged [name] to get checked out for sleep apnea given [name] diagnosis of hemochromatosis and polycythemia.   [name] Past Medical History Is Significant For: Past Medical History:  Diagnosis Date   Allergy    Anemia    Anxiety    Asthma    DR. BYRUM   Blood transfusion without reported diagnosis    Cataract    Cholelithiasis    Chronic kidney disease    gallstones    Compression fracture of body of thoracic vertebra (HCC)    Depression    Dyspnea    Dysrhythmia    palpitations   GERD (gastroesophageal reflux disease)    Heart attack (HCC)    Hemorrhoids    IBS (irritable bowel syndrome)    Iliotibial band syndrome    left knee   Migraine    Mitochondrial myopathy    Normal coronary arteries    by cardiac catheterization 2003   Osteoporosis    Pericarditis    age 46   Pneumothorax    Polycythemia vera(238.4) 06/17/2012   PVC's (premature ventricular contractions)    Vitamin D deficiency     [name] Past Surgical History Is Significant For: Past Surgical History:  Procedure Laterality Date   APPENDECTOMY     CARDIAC CATHETERIZATION     CATARACT EXTRACTION W/ INTRAOCULAR LENS  IMPLANT, BILATERAL     CESAREAN SECTION     x 1   CHOLECYSTECTOMY N/A 06/05/2021   Procedure: LAPAROSCOPIC CHOLECYSTECTOMY WITH INTRAOPERATIVE CHOLANGIOGRAM;  Surgeon: Griselda Miner, MD;  Location: [facility];  Service: General;  Laterality: N/A;   COLONOSCOPY     DILATATION & CURETTAGE/HYSTEROSCOPY WITH MYOSURE N/A 10/29/2014   Procedure: DILATATION & CURETTAGE/HYSTEROSCOPY WITH MYOSURE;  Surgeon: Huel Cote, MD;  Location: [facility];  Service: Gynecology;  Laterality: N/A;   IR FLUORO GUIDED NEEDLE PLC ASPIRATION/INJECTION LOC  06/10/2016   IR FLUORO GUIDED NEEDLE PLC ASPIRATION/INJECTION LOC  06/10/2016   KYPHOPLASTY N/A 05/14/2016   Procedure: T 11 KYPHOPLASTY;  Surgeon: Estill Bamberg, MD;  Location: [facility];  Service: Orthopedics;  Laterality: N/A;  T 11  KYPHOPLASTY   KYPHOPLASTY N/A 06/18/2016   Procedure: THORACIC 12 KYPHOPLASTY;  Surgeon: Estill Bamberg, MD;  Location: [facility];  Service: Orthopedics;  Laterality: N/A;  THORACIC 12 KYPHOPLASTY; REQUEST 1 HOUR AND FLIP ROOM   MOUTH SURGERY     NASAL SINUS SURGERY     NM MYOCAR PERF WALL MOTION  04/27/2006   normal   US ECHOCARDIOGRAPHY  12/25/2010   normal   VARICOSE VEIN SURGERY      [name] Family History Is Significant For: Family History  Problem Relation Age of Onset   Asthma Mother    Arthritis Mother    Depression Mother    Hypertension Mother    Tuberculosis Mother    Emphysema Father    Stroke Father    Alcohol abuse Father    Heart attack Father    Lung cancer Father    COPD  Brother    Colon cancer Neg Hx    Esophageal cancer Neg Hx    Stomach cancer Neg Hx    Pancreatic cancer Neg Hx    Sleep apnea Neg Hx     [name] Social History Is Significant For: Social History   Socioeconomic History   Marital status: Married    Spouse name: [name]   Number of children: 2   Years of education: Grad   Highest education level: Not on file  Occupational History   Occupation: Patent examiner: RETIRED  Tobacco Use   Smoking status: Never   Smokeless tobacco: Never   Tobacco comments:    never used tobacco  Vaping Use   Vaping status: Never Used  Substance and Sexual Activity   Alcohol use: Yes    Alcohol/week: 14.0 standard drinks of alcohol    Types: 14 Glasses of wine per week    Comment: occasionally   Drug use: No   Sexual activity: Not on file  Other Topics Concern   Not on file  Social History Narrative   Health Care POA:    Emergency Contact: Hickman, [name], (c) [phone]   End of Life Plan:    Who lives with you: Hickman   Any pets: none   Diet: Pt has a varied diet.  Eats 5 sm. meals throughout day, focuses on protein, doesn't care for fruits and vegetables very much.   Exercise: Pt has a personal training and exercises several times  a week.   Seatbelts: Pt reports wearing seatbelt when in vehicle.   [name] Exposure/Protection: Pt reports wearing sun protection.    Hobbies: reading, visiting with friends   Patient has a Event organiser.   Patient has two children.   Patient is retired.   Patient does not drink any caffeine.   Patient is right handed.         Social Determinants of Health   Financial Resource Strain: Not on file  Food Insecurity: Not on file  Transportation Needs: Not on file  Physical Activity: Not on file  Stress: Not on file  Social Connections: Not on file    [name] Allergies Are:  Allergies  Allergen Reactions   Dulera [Mometasone Furo-Formoterol Fum] Cough   Ciprofloxacin Nausea Only   Nitrofurantoin Macrocrystal Itching  :   [name] Current Medications Are:  Outpatient Encounter Medications as of 09/29/2022  Medication Sig   budesonide-formoterol (SYMBICORT) 80-4.5 MCG/ACT inhaler Inhale into the lungs.   CRANBERRY PO Take by mouth.   folic acid (FOLVITE) 1 MG tablet TAKE 1 TABLET(1 MG) BY MOUTH DAILY   LORazepam (ATIVAN) 0.5 MG tablet TAKE 1 TABLET BY MOUTH EVERY NIGHT AT BEDTIME (Patient taking differently: Take 0.5 mg by mouth at bedtime.)   Melatonin 10 MG TABS Take 10 mg by mouth at bedtime as needed.   pantoprazole (PROTONIX) 40 MG tablet Take 20 mg by mouth daily.   propranolol (INDERAL) 20 MG tablet Take 20 mg by mouth daily.   rivaroxaban (XARELTO) 20 MG TABS tablet TAKE 1 TABLET DAILY WITH SUPPER   Tiotropium Bromide-Olodaterol (STIOLTO RESPIMAT) 2.5-2.5 MCG/ACT AERS Inhale 2 puffs into the lungs daily.   Vitamin D, Ergocalciferol, (DRISDOL) 50000 units CAPS capsule Take 1 capsule (50,000 Units total) by mouth every 7 (seven) days. Sundays   No facility-administered encounter medications on file as of 09/29/2022.  :  Review of Systems:  Out of a complete 14 point review of systems, all are reviewed and  negative with the exception of these symptoms as listed below:  Review of  Systems  Neurological:        Pt here for CPAP f/u Pt states not able to adjust to mask Pt states switched mask  3 times   ESS: 6     Objective:  Neurological Exam  Physical Exam Physical Examination:   Vitals:   09/29/22 1411  BP: 128/71  Pulse: 74    General Examination: The patient is a very pleasant 77 y.o. female in no acute distress. [name] appears well-developed and well-nourished and well groomed.   HEENT: Normocephalic, atraumatic, pupils are equal, round and reactive to light, extraocular tracking is well-preserved, [name] has a facial mask in place which [name] briefly removed for the exam.  [name] does have some irritation around where [name] protective mask has been. Speech is clear with no dysarthria noted, slight raspiness noted. There is no hypophonia. There is no lip, neck/head, jaw or voice tremor. Neck is supple with full range of passive and active motion. There are no carotid bruits on auscultation. Oropharynx exam reveals: mild mouth dryness, adequate dental hygiene with bridges in place, and moderate airway crowding. Tongue protrudes centrally and palate elevates symmetrically.    Chest: Clear to auscultation without wheezing, rhonchi or crackles noted.   Heart: S1+S2+0, regular and normal without murmurs, rubs or gallops noted.    Abdomen: Soft, non-tender and non-distended.   Extremities: There is no obvious swelling in the distal lower extremities bilaterally.    Skin: Warm and dry without trophic changes noted.    Musculoskeletal: exam reveals no obvious joint deformities but has back pain and has to stand up periodically during this exam.    Neurologically:  Mental status: The patient is awake, alert and oriented in all 4 spheres. [name] immediate and remote memory, attention, language skills and fund of knowledge are appropriate. There is no evidence of aphasia, agnosia, apraxia or anomia. Speech is clear with normal prosody and enunciation. Thought process is linear.  Mood is normal and affect is normal.  Cranial nerves II - XII are as described above under HEENT exam.  Motor exam: Normal bulk, strength and tone is noted. There is no obvious action or resting tremor.  Fine motor skills and coordination: grossly intact.  Cerebellar testing: No dysmetria or intention tremor. There is no truncal or gait ataxia.  Sensory exam: intact to light touch in the upper and lower extremities.  Gait, station and balance: [name] stands with mild difficulty and uses a 4 wheeled walker with seat.      Assessment and Plan:    In summary, BRITTONI ALAND is a very pleasant 77 year old female with an underlying complex medical history of allergies, asthma, anemia, chronic kidney disease, depression, anxiety, reflux disease, coronary artery disease with history of heart attack, irritable bowel syndrome, migraine headaches, mitochondrial myopathy, osteoporosis, polycythemia, hereditary hemochromatosis, DVT, PVCs, vitamin D deficiency, thoracic compression fracture, chronic kidney disease, and cholelithiasis, who presents for follow-up consultation of [name] obstructive sleep apnea after interim testing and starting home AutoPap therapy. [name] had a home sleep test through our office on 06/17/2022 which showed an AHI of 13.8/h, O2 nadir 85%, time below or at 88% saturation of little over 6 minutes for the night.  Mild to moderate snoring was detected.  [name] has been on AutoPap therapy since 07/06/2022.  [name] has a Civil engineer, contracting sense 11 AutoSet machine.  [name] DME provider is [facility].  [name] has struggled with the  interface, has already tried 3 different mask, [name] is not quite compliant with treatment but is motivated to continue with it if [name] finds a well-fitting mask.  I provided [name] with a sample of the Respironics DreamWear nasal cushion interface which is a starter pack and would allow [name] to trial the small inserted with the small-medium frame and also changed to a medium frame and a  medium insert if need be.  [name] is willing to try this.  [name] would be willing to get retested if the insurance requires this just to be able to get used to using AutoPap.  [name] is commended for [name] persistence.  [name] is advised to follow-up in 6 months to see the nurse practitioner or sooner if the need arises.  [name] is encouraged to keep [place] posted as to how the mask is fitting for [name] and if the DME provider has any updates for [name].  We will also look into the possibility of prescribing the Respironics nasal cushion for [name] if [name] likes it.  I answered all the questions today and the patient and [name] were in agreement. I spent 40 minutes in total face-to-face time and in reviewing records during pre-charting, more than 50% of which was spent in counseling and coordination of care, reviewing test results, reviewing medications and treatment regimen and/or in discussing or reviewing the diagnosis of OSA, the prognosis and treatment options. Pertinent laboratory and imaging test results that were available during this visit with the patient were reviewed by me and considered in my medical decision making (see chart for details).

## 2022-09-29 NOTE — Patient Instructions (Addendum)
It was nice to see you both again today. I am sorry, you have had difficulty with your CPAP mask.   Please continue using your autoPAP regularly. While your insurance requires that you use PAP at least 4 hours each night on 70% of the nights, I recommend, that you not skip any nights and use it throughout the night if you can. Getting used to PAP and staying with the treatment long term does take time and patience and discipline. Untreated obstructive sleep apnea when it is moderate to severe can have an adverse impact on cardiovascular health and raise her risk for heart disease, arrhythmias, hypertension, congestive heart failure, stroke and diabetes. Untreated obstructive sleep apnea causes sleep disruption, nonrestorative sleep, and sleep deprivation. This can have an impact on your day to day functioning and cause daytime sleepiness and impairment of cognitive function, memory loss, mood disturbance, and problems focussing. Using PAP regularly can improve these symptoms.  I recommend a follow up with one of our nurse practitioners in 6 months.

## 2022-10-07 ENCOUNTER — Other Ambulatory Visit: Payer: Self-pay | Admitting: Hematology and Oncology

## 2022-10-07 DIAGNOSIS — I82412 Acute embolism and thrombosis of left femoral vein: Secondary | ICD-10-CM

## 2022-10-07 DIAGNOSIS — I2699 Other pulmonary embolism without acute cor pulmonale: Secondary | ICD-10-CM

## 2022-10-08 ENCOUNTER — Other Ambulatory Visit: Payer: Self-pay

## 2022-10-15 DIAGNOSIS — Z23 Encounter for immunization: Secondary | ICD-10-CM | POA: Diagnosis not present

## 2022-10-29 DIAGNOSIS — D510 Vitamin B12 deficiency anemia due to intrinsic factor deficiency: Secondary | ICD-10-CM | POA: Diagnosis not present

## 2022-10-29 DIAGNOSIS — Z23 Encounter for immunization: Secondary | ICD-10-CM | POA: Diagnosis not present

## 2022-11-21 ENCOUNTER — Other Ambulatory Visit: Payer: Self-pay | Admitting: Hematology & Oncology

## 2022-11-21 DIAGNOSIS — R7983 Abnormal findings of blood amino-acid level: Secondary | ICD-10-CM

## 2022-12-02 ENCOUNTER — Encounter: Payer: Self-pay | Admitting: Emergency Medicine

## 2022-12-02 ENCOUNTER — Telehealth: Payer: Self-pay | Admitting: *Deleted

## 2022-12-02 ENCOUNTER — Ambulatory Visit (INDEPENDENT_AMBULATORY_CARE_PROVIDER_SITE_OTHER): Payer: Medicare Other | Admitting: Emergency Medicine

## 2022-12-02 VITALS — BP 130/68 | HR 76 | Temp 98.0°F | Ht 63.0 in | Wt 140.0 lb

## 2022-12-02 DIAGNOSIS — J449 Chronic obstructive pulmonary disease, unspecified: Secondary | ICD-10-CM

## 2022-12-02 DIAGNOSIS — R053 Chronic cough: Secondary | ICD-10-CM

## 2022-12-02 MED ORDER — ALBUTEROL SULFATE HFA 108 (90 BASE) MCG/ACT IN AERS: 2.0000 | INHALATION_SPRAY | Freq: Four times a day (QID) | RESPIRATORY_TRACT | 6 refills | Status: AC | PRN

## 2022-12-02 NOTE — Telephone Encounter (Signed)
Please run test claim to find affordable inhaler as patient is currently paying $145 for Stiolto.  Please advise on what is on preferred list comparable to the Stiolto.

## 2022-12-02 NOTE — Assessment & Plan Note (Signed)
Likely contribution of her rhinitis and some GERD.  She has wanted to avoid treating either of these and we will hold off for now.

## 2022-12-02 NOTE — Addendum Note (Signed)
Addended by: Delrae Rend on: 12/02/2022 03:32 PM   Modules accepted: Orders

## 2022-12-02 NOTE — Patient Instructions (Addendum)
Please continue Stiolto 2 puffs once daily for now.  We will investigate with her clinical pharmacist whether there may be a more affordable alternative through your insurance (possibly Anoro). We will give your prescription for albuterol.  Keep this available to use 2 puffs if you needed for shortness of breath, chest tightness, wheezing. Flu shot, COVID-19 booster and RSV are all up-to-date Continue your Xarelto as you have been taking it Follows with Dr. Delton Coombes in 1 year, sooner if you have any problems.

## 2022-12-02 NOTE — Progress Notes (Signed)
Subjective:    Patient ID: [name], female    DOB: 1945-10-12, 77 y.o.   MRN: [id number]  HPI  ROV 10/31/20 --77 year old woman with severe COPD and a positive bronchodilator response, history of DVT and bilateral PE, polycythemia vera with hemochromatosis.  She also has chronic cough with contribution from GERD and rhinitis.  It improved when she increased her PPI.  At our last visit I changed her Symbicort to Stiolto to see if she would get more benefit.  She remains on anticoagulation. The addition of LAMA did seem to help her breathing. She is not having increased mucous. She is on pantoprazole bid. She has a couple coughing spells at night, better w chewing gum. Not having a lot of nasal congestion.   ROV 12/02/2022 --follow-up visit for 77 year old woman with a history of severe COPD with a positive bronchodilator response and asthmatic phenotype.  She has GERD with some associated cough.  Also with a history of polycythemia vera and hemochromatosis, DVT with bilateral PE on Xarelto.  Last seen here in November 2023.  Her PPI was held at that time. Today she reports she has been having more trouble for the last 2 months. More SOB since last time, mainly over the last 2 months. Has noticed some chest tightness, being more aware of her breathing. Can happen at rest. She has noticed it when she lays supine. She has been tried on CPAP for mild OSA - wasn't really able to tolerate. She is worried about the cost of stiolto - is hoping there is a preferred med like anoro. Her flu shot is up to date. COVID booster and rsv   Review of Systems  Constitutional:  Negative for fever and unexpected weight change.  HENT:  Positive for congestion, postnasal drip and sinus pressure. Negative for dental problem, ear pain, nosebleeds, rhinorrhea, sneezing, sore throat and trouble swallowing.   Eyes:  Negative for redness and itching.  Respiratory:  Positive for cough and shortness of breath. Negative  for chest tightness and wheezing.   Cardiovascular:  Negative for palpitations and leg swelling.  Gastrointestinal:  Negative for nausea and vomiting.  Genitourinary:  Negative for dysuria.  Musculoskeletal:  Negative for joint swelling.  Skin:  Negative for rash.  Neurological:  Negative for headaches.  Hematological:  Does not bruise/bleed easily.  Psychiatric/Behavioral:  Negative for dysphoric mood. The patient is not nervous/anxious.        Objective:   Physical Exam Vitals:   12/02/22 1501  BP: 130/68  Pulse: 76  Temp: 98 F (36.7 C)  TempSrc: Oral  SpO2: 98%  Weight: 140 lb (63.5 kg)  Height: 5\' 3"  (1.6 m)   Gen: Pleasant, well-nourished, in no distress,  normal affect, in a wheelchair  ENT: No lesions,  mouth clear,  oropharynx clear, no postnasal drip  Neck: No JVD, no stridor  Lungs: No use of accessory muscles, clear without rales or rhonchi, no wheeze  Cardiovascular: RRR, heart sounds normal, no murmur or gallops, no peripheral edema  Musculoskeletal: No deformities, no cyanosis or clubbing  Neuro: alert, non focal  Skin: Warm, no lesions or rashes       Assessment & Plan:  COPD (chronic obstructive pulmonary disease) (HCC) Multifactorial dyspnea.  At least in part due to her COPD also some deconditioning.  She has noticed some chest tightness depending on level of exertion.  Also gets short of breath when she is supine, question respiratory muscle weakness and also untreated OSA.  She was unable to tolerate CPAP.  We will continue LABA/LAMA as she has benefited.  She is hoping that Anoro might be less expensive and we will check this with our clinical pharmacist.  For now continue still alto.  I will give her prescription for albuterol to have available to use if needed.  Her vaccinations are up-to-date.  Cough Likely contribution of her rhinitis and some GERD.  She has wanted to avoid treating either of these and we will hold off for now.   Levy Pupa, MD, PhD 12/02/2022, 3:28 PM [facility] [phone] or if no answer [phone]

## 2022-12-02 NOTE — Assessment & Plan Note (Signed)
Multifactorial dyspnea.  At least in part due to her COPD also some deconditioning.  She has noticed some chest tightness depending on level of exertion.  Also gets short of breath when she is supine, question respiratory muscle weakness and also untreated OSA.  She was unable to tolerate CPAP.  We will continue LABA/LAMA as she has benefited.  She is hoping that Anoro might be less expensive and we will check this with our clinical pharmacist.  For now continue still alto.  I will give her prescription for albuterol to have available to use if needed.  Her vaccinations are up-to-date.

## 2022-12-03 DIAGNOSIS — D510 Vitamin B12 deficiency anemia due to intrinsic factor deficiency: Secondary | ICD-10-CM | POA: Diagnosis not present

## 2022-12-04 ENCOUNTER — Encounter: Payer: Self-pay | Admitting: Hematology and Oncology

## 2022-12-04 ENCOUNTER — Other Ambulatory Visit (HOSPITAL_COMMUNITY): Payer: Self-pay

## 2022-12-04 NOTE — Telephone Encounter (Signed)
Patient is in the Coverage Gap of their insurance plan contributing to these higher prices, the preferred LAMA/LABA on the plan at this time is the [employer] and Anoro. Test claim for Anoro shows a co-pay of $119.50. This may change if the patient wants to switch as the plan is showing that the Stiolto is currently filled.

## 2022-12-08 ENCOUNTER — Telehealth: Payer: Self-pay | Admitting: *Deleted

## 2022-12-08 NOTE — Telephone Encounter (Signed)
Patient called to report she was feeling very very low. She said she used to have phlebotomies, but hasn't needed one in a long time and wondered if that was why she was feeling the way she did. Ms. Veltri said her next appt is 01/06/23 and that seemed a long time to wait. She asked to come in for lab work this week to check her blood. Dr. [name] informed of message and asked that patient be scheduled for a lab appt this week.  Patient in agreement, appt with lab scheduled for 12/10/22 at 1:15. Patient verbalized understanding of date and tim of appt.

## 2022-12-09 ENCOUNTER — Other Ambulatory Visit: Payer: Self-pay | Admitting: Nurse Practitioner

## 2022-12-09 DIAGNOSIS — D45 Polycythemia vera: Secondary | ICD-10-CM

## 2022-12-10 ENCOUNTER — Inpatient Hospital Stay: Payer: Medicare Other | Attending: Internal Medicine

## 2022-12-10 DIAGNOSIS — D45 Polycythemia vera: Secondary | ICD-10-CM | POA: Insufficient documentation

## 2022-12-10 LAB — CBC WITH DIFFERENTIAL (CANCER CENTER ONLY)
Abs Immature Granulocytes: 0.04 10*3/uL (ref 0.00–0.07)
Basophils Absolute: 0 10*3/uL (ref 0.0–0.1)
Basophils Relative: 0 %
Eosinophils Absolute: 0.1 10*3/uL (ref 0.0–0.5)
Eosinophils Relative: 2 %
HCT: 45.2 % (ref 36.0–46.0)
Hemoglobin: 15.8 g/dL — ABNORMAL HIGH (ref 12.0–15.0)
Immature Granulocytes: 1 %
Lymphocytes Relative: 19 %
Lymphs Abs: 1.3 10*3/uL (ref 0.7–4.0)
MCH: 35.2 pg — ABNORMAL HIGH (ref 26.0–34.0)
MCHC: 35 g/dL (ref 30.0–36.0)
MCV: 100.7 fL — ABNORMAL HIGH (ref 80.0–100.0)
Monocytes Absolute: 0.8 10*3/uL (ref 0.1–1.0)
Monocytes Relative: 12 %
Neutro Abs: 4.6 10*3/uL (ref 1.7–7.7)
Neutrophils Relative %: 66 %
Platelet Count: 119 10*3/uL — ABNORMAL LOW (ref 150–400)
RBC: 4.49 MIL/uL (ref 3.87–5.11)
RDW: 11.8 % (ref 11.5–15.5)
WBC Count: 6.9 10*3/uL (ref 4.0–10.5)
nRBC: 0 % (ref 0.0–0.2)

## 2022-12-10 LAB — IRON AND IRON BINDING CAPACITY (CC-WL,HP ONLY)
Iron: 165 ug/dL (ref 28–170)
Saturation Ratios: 46 % — ABNORMAL HIGH (ref 10.4–31.8)
TIBC: 357 ug/dL (ref 250–450)
UIBC: 192 ug/dL (ref 148–442)

## 2022-12-10 LAB — FERRITIN: Ferritin: 34 ng/mL (ref 11–307)

## 2022-12-14 ENCOUNTER — Ambulatory Visit (INDEPENDENT_AMBULATORY_CARE_PROVIDER_SITE_OTHER): Payer: Medicare Other | Admitting: Podiatry

## 2022-12-14 DIAGNOSIS — M79675 Pain in left toe(s): Secondary | ICD-10-CM | POA: Diagnosis not present

## 2022-12-14 DIAGNOSIS — L600 Ingrowing nail: Secondary | ICD-10-CM | POA: Diagnosis not present

## 2022-12-14 MED ORDER — CEPHALEXIN 500 MG PO CAPS: 500.0000 mg | ORAL_CAPSULE | Freq: Three times a day (TID) | ORAL | 0 refills | Status: DC

## 2022-12-14 MED ORDER — MUPIROCIN 2 % EX OINT: 1.0000 | TOPICAL_OINTMENT | Freq: Two times a day (BID) | CUTANEOUS | 2 refills | Status: DC

## 2022-12-14 NOTE — Patient Instructions (Signed)

## 2022-12-14 NOTE — Progress Notes (Unsigned)
Subjective:   Patient ID: [name], female   DOB: 77 y.o.   MRN: [id number]   HPI Chief Complaint  Patient presents with   Toe Pain    Left great toenail pain- medial side. Has been going on for  at least 6 weeks. She has been favoring the foot and can only wear flip flops due to the pain of the toe.   Had pedicure and it started   ROS      Objective:  Physical Exam  ***     Assessment:  ***     Plan:  ***   Left medial

## 2022-12-15 ENCOUNTER — Telehealth: Payer: Self-pay

## 2022-12-15 NOTE — Telephone Encounter (Signed)
T/C from pt requesting 11/14 lab results.  She states she is not feeling well and feel she may need a phlebotomy.   Per Dr Dorsey:No indication for phlebotomy   Pt advised of lab results and to f/up with her PCP since she is not feeling well.  Another option would be blood donation.  Pt voiced understanding

## 2022-12-17 ENCOUNTER — Other Ambulatory Visit: Payer: Self-pay | Admitting: Primary Care

## 2022-12-31 DIAGNOSIS — E538 Deficiency of other specified B group vitamins: Secondary | ICD-10-CM | POA: Diagnosis not present

## 2023-01-05 ENCOUNTER — Other Ambulatory Visit: Payer: Self-pay | Admitting: *Deleted

## 2023-01-05 DIAGNOSIS — D45 Polycythemia vera: Secondary | ICD-10-CM

## 2023-01-05 NOTE — Progress Notes (Unsigned)
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 [name]   Interval History:  [name] 77 y.o. female with medical history significant for polycythemia (JAK2 negative) and Hereditary Hemochromatosis (H63D heterozygote mutation) who presents for a follow up visit. The patient's last visit was on 07/22/2022. In the interim since the last visit she has had no major changes in her health.  On exam today [name] reports she has been gaining weight lately, likely because she has not been exercising well.  She reports that she eats very little and is surprised that her weight is increasing.  She reports her energy is "terrible".  She reports that she does have COPD and struggles with chronic shortness of breath.  She reports that she has not had any recent infections but did last have a urinary tract infection last Thanksgiving.  She reports that she does have continual diarrhea for which she takes probiotics and fiber supplements.  She notes that she is tolerating her Xarelto well with no bleeding, bruising, or dark stools.  She is not having any signs or symptoms concerning for recurrent VTE.  She denies any bleeding, bruising, or dark stools.  She otherwise denies any fevers, chills, sweats, nausea, vomiting or constipation.  A full 10 point ROS was otherwise negative.  MEDICAL HISTORY:  Past Medical History:  Diagnosis Date   Allergy    Anemia    Anxiety    Asthma    DR. 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   PVC's (premature ventricular contractions)    Vitamin D deficiency     SURGICAL HISTORY: Past Surgical History:  Procedure Laterality Date   APPENDECTOMY     CARDIAC CATHETERIZATION     CATARACT EXTRACTION W/ INTRAOCULAR LENS  IMPLANT, BILATERAL     CESAREAN SECTION     x 1   CHOLECYSTECTOMY N/A 06/05/2021   Procedure: LAPAROSCOPIC CHOLECYSTECTOMY WITH INTRAOPERATIVE CHOLANGIOGRAM;  Surgeon: Griselda Miner, MD;  Location: [facility];  Service: General;  Laterality: N/A;   COLONOSCOPY     DILATATION & CURETTAGE/HYSTEROSCOPY WITH MYOSURE N/A 10/29/2014   Procedure: DILATATION & CURETTAGE/HYSTEROSCOPY WITH MYOSURE;  Surgeon: Huel Cote, MD;  Location: [facility];  Service: Gynecology;  Laterality: N/A;   IR FLUORO GUIDED NEEDLE PLC ASPIRATION/INJECTION LOC  06/10/2016   IR FLUORO GUIDED NEEDLE PLC ASPIRATION/INJECTION LOC  06/10/2016   KYPHOPLASTY N/A 05/14/2016   Procedure: T 11 KYPHOPLASTY;  Surgeon: Estill Bamberg, MD;  Location:  [facility];  Service: Orthopedics;  Laterality: N/A;  T 11 KYPHOPLASTY   KYPHOPLASTY N/A 06/18/2016   Procedure: THORACIC 12 KYPHOPLASTY;  Surgeon: Estill Bamberg, MD;  Location: [facility];  Service: Orthopedics;  Laterality: N/A;  THORACIC 12 KYPHOPLASTY; REQUEST 1 HOUR AND FLIP ROOM   MOUTH SURGERY     NASAL SINUS SURGERY     NM MYOCAR PERF WALL MOTION  04/27/2006   normal   US ECHOCARDIOGRAPHY  12/25/2010   normal   VARICOSE VEIN SURGERY      SOCIAL HISTORY: Social History    Socioeconomic History   Marital status: Married    Spouse name: Richard   Number of children: 2   Years of education: Grad   Highest education level: Not on file  Occupational History   Occupation: Patent examiner: RETIRED  Tobacco Use   Smoking status: Never   Smokeless tobacco: Never   Tobacco comments:    never used tobacco  Vaping Use   Vaping status: Never Used  Substance and Sexual Activity   Alcohol use: Yes    Alcohol/week: 14.0 standard drinks of alcohol    Types: 14 Glasses of wine per week    Comment: occasionally   Drug use: No   Sexual activity: Not on file  Other Topics Concern   Not on file  Social History Narrative   Health Care POA:    Emergency Contact: husband, Truman Hayward, (c) [phone]   End of Life Plan:    Who lives with you: husband   Any pets: none   Diet: Pt has a varied diet.  Eats 5 sm. meals throughout day, focuses on protein, doesn't care for fruits and vegetables very much.   Exercise: Pt has a personal training and exercises several times a week.   Seatbelts: Pt reports wearing seatbelt when in vehicle.   [name] Exposure/Protection: Pt reports wearing sun protection.    Hobbies: reading, visiting with friends   Patient has a Event organiser.   Patient has two children.   Patient is retired.   Patient does not drink any caffeine.   Patient is right handed.         Social Determinants of Health   Financial Resource Strain: Not on file  Food Insecurity: Not on file  Transportation Needs: Not on file  Physical Activity: Not on file  Stress: Not on file  Social Connections: Not on file  Intimate Partner Violence: Not on file    FAMILY HISTORY: Family History  Problem Relation Age of Onset   Asthma Mother    Arthritis Mother    Depression Mother    Hypertension Mother    Tuberculosis Mother    Emphysema Father    Stroke Father    Alcohol abuse Father    Heart attack Father    Lung cancer Father    COPD  Brother    Colon cancer Neg Hx    Esophageal cancer Neg Hx    Stomach cancer Neg Hx    Pancreatic cancer Neg Hx    Sleep apnea Neg Hx     ALLERGIES:  is allergic to [employer] furo-formoterol fum], ciprofloxacin, and nitrofurantoin macrocrystal.  MEDICATIONS:  Current Outpatient Medications  Medication Sig Dispense Refill   albuterol (VENTOLIN HFA) 108 (90 Base) MCG/ACT inhaler Inhale 2 puffs into the lungs every 6 (six) hours as needed for wheezing or shortness of breath. 8 g 6   budesonide-formoterol (SYMBICORT) 80-4.5 MCG/ACT inhaler Inhale into the  lungs. (Patient not taking: Reported on 12/02/2022)     cephALEXin (KEFLEX) 500 MG capsule Take 1 capsule (500 mg total) by mouth 3 (three) times daily. 21 capsule 0   CRANBERRY PO Take by mouth.     folic acid (FOLVITE) 1 MG tablet TAKE 1 TABLET(1 MG) BY MOUTH DAILY 30 tablet 11   LORazepam (ATIVAN) 0.5 MG tablet TAKE 1 TABLET BY MOUTH EVERY NIGHT AT BEDTIME (Patient taking differently: Take 0.5 mg by mouth at bedtime.) 30 tablet 0   Melatonin 10 MG TABS Take 10 mg by mouth at bedtime as needed.     mupirocin ointment (BACTROBAN) 2 % Apply 1 Application topically 2 (two) times daily. 30 g 2   pantoprazole (PROTONIX) 40 MG tablet Take 20 mg by mouth daily.     propranolol (INDERAL) 20 MG tablet Take 20 mg by mouth daily.     rivaroxaban (XARELTO) 20 MG TABS tablet TAKE 1 TABLET BY MOUTH DAILY WITH SUPPER 90 tablet 2   STIOLTO RESPIMAT 2.5-2.5 MCG/ACT AERS INHALE 2 PUFFS INTO THE LUNGS DAILY 4 g 11   Vitamin D, Ergocalciferol, (DRISDOL) 50000 units CAPS capsule Take 1 capsule (50,000 Units total) by mouth every 7 (seven) days. Sundays 30 capsule 3   No current facility-administered medications for this visit.    REVIEW OF SYSTEMS:   Constitutional: ( - ) fevers, ( - )  chills , ( - ) night sweats Eyes: ( - ) blurriness of vision, ( - ) double vision, ( - ) watery eyes Ears, nose, mouth, throat, and face: ( - ) mucositis, ( - )  sore throat Respiratory: ( - ) cough, ( - ) dyspnea, ( - ) wheezes Cardiovascular: ( - ) palpitation, ( - ) chest discomfort, ( - ) lower extremity swelling Gastrointestinal:  ( - ) nausea, ( - ) heartburn, ( - ) change in bowel habits Skin: ( - ) abnormal skin rashes Lymphatics: ( - ) new lymphadenopathy, ( - ) easy bruising Neurological: ( - ) numbness, ( - ) tingling, ( - ) new weaknesses Behavioral/Psych: ( - ) mood change, ( - ) new changes  All other systems were reviewed with the patient and are negative.  PHYSICAL EXAMINATION:  Vitals:   01/06/23 1443  BP: 135/62  Pulse: 77  Resp: 18  Temp: 98.2 F (36.8 C)  SpO2: 97%     Filed Weights   01/06/23 1443  Weight: 149 lb 1.6 oz (67.6 kg)     GENERAL: Well-appearing elderly Caucasian, alert, no distress and comfortable SKIN: skin color, texture, turgor are normal, no rashes or significant lesions EYES: conjunctiva are pink and non-injected, sclera clear LUNGS: clear to auscultation and percussion with normal breathing effort HEART: regular rate & rhythm and no murmurs and no lower extremity edema Musculoskeletal: no cyanosis of digits and no clubbing  PSYCH: alert & oriented x 3, fluent speech NEURO: no focal motor/sensory deficits  LABORATORY DATA:  I have reviewed the data as listed    Latest Ref Rng & Units 01/06/2023    2:07 PM 12/10/2022    1:13 PM 07/22/2022   10:33 AM  CBC  WBC 4.0 - 10.5 K/uL 6.8  6.9  6.0   Hemoglobin 12.0 - 15.0 g/dL 40.9  81.1  91.4   Hematocrit 36.0 - 46.0 % 44.8  45.2  43.1   Platelets 150 - 400 K/uL 125  119  125        Latest Ref Rng &  Units 01/06/2023    2:07 PM 07/22/2022   10:33 AM 01/29/2022    2:18 PM  CMP  Glucose 70 - 99 mg/dL 409  94  76   BUN 8 - 23 mg/dL 11  11  15    Creatinine 0.44 - 1.00 mg/dL 8.11  9.14  7.82   Sodium 135 - 145 mmol/L 135  135  138   Potassium 3.5 - 5.1 mmol/L 3.8  4.4  4.1   Chloride 98 - 111 mmol/L 100  101  103   CO2 22 - 32 mmol/L 26  28   28    Calcium 8.9 - 10.3 mg/dL 9.2  9.3  9.1   Total Protein 6.5 - 8.1 g/dL 6.8  6.9  6.3   Total Bilirubin <1.2 mg/dL 1.0  1.2  1.3   Alkaline Phos 38 - 126 U/L 91  81  68   AST 15 - 41 U/L 36  33  27   ALT 0 - 44 U/L 41  28  31     No results found for: "MPROTEIN" Lab Results  Component Value Date   KAPLAMBRATIO 0.91 04/27/2016    RADIOGRAPHIC STUDIES: No results found.  ASSESSMENT & PLAN [name] 77 y.o. female with medical history significant for polycythemia (JAK2 negative) and Hereditary Hemochromatosis (H63D heterozygote mutation) who presents for a follow up visit.  # Polycythemia, Unclear Etiology --patient noted to be JAK2 negative. 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  --Of note, bone marrow biopsy performed in 2014 did not show evidence of myeloproliferative neoplasm. --the patient has no driver mutation, there is not a clear indication for phlebotomy --patient is a non-smoker with no symptoms of sleep apnea --RTC in 6 months time.  # Bone Density -- Last bone density test on the 08/14/2022.  Defer future bone density testing and mammogram orders to patient's PCP..  # Hereditary Hemochromatosis (H63D heterozygote mutation) --no indication for phlebotomy for an H63D heterozygote.  --continue to monitor   # History of Unprovoked Pulmonary Embolism --continue Xarelto 20 mg PO daily  --positive lupus anticoagulant panel (collect on anticoagulation). Negative antibodies. Low clinic suspicion on APS  No orders of the defined types were placed in this encounter.   All questions were answered. The patient knows to call the clinic with any problems, questions or concerns.  A total of more than 30 minutes were spent on this encounter with face-to-face time and non-face-to-face time, including preparing to see the patient, ordering tests and/or medications, counseling the patient and coordination of care as outlined above.   Ulysees Barns,  MD Department of Hematology/Oncology [facility] Phone: [phone] Pager: [phone] Email: [email or web address]  01/06/2023 4:12 PM

## 2023-01-06 ENCOUNTER — Inpatient Hospital Stay (HOSPITAL_BASED_OUTPATIENT_CLINIC_OR_DEPARTMENT_OTHER): Payer: Medicare Other | Admitting: Hematology and Oncology

## 2023-01-06 ENCOUNTER — Inpatient Hospital Stay: Payer: Medicare Other | Attending: Internal Medicine

## 2023-01-06 VITALS — BP 135/62 | HR 77 | Temp 98.2°F | Resp 18 | Ht 63.0 in | Wt 149.1 lb

## 2023-01-06 DIAGNOSIS — I2699 Other pulmonary embolism without acute cor pulmonale: Secondary | ICD-10-CM | POA: Diagnosis not present

## 2023-01-06 DIAGNOSIS — Z801 Family history of malignant neoplasm of trachea, bronchus and lung: Secondary | ICD-10-CM | POA: Insufficient documentation

## 2023-01-06 DIAGNOSIS — R635 Abnormal weight gain: Secondary | ICD-10-CM | POA: Insufficient documentation

## 2023-01-06 DIAGNOSIS — Z87440 Personal history of urinary (tract) infections: Secondary | ICD-10-CM | POA: Diagnosis not present

## 2023-01-06 DIAGNOSIS — Z8719 Personal history of other diseases of the digestive system: Secondary | ICD-10-CM | POA: Insufficient documentation

## 2023-01-06 DIAGNOSIS — Z7901 Long term (current) use of anticoagulants: Secondary | ICD-10-CM | POA: Diagnosis not present

## 2023-01-06 DIAGNOSIS — Z881 Allergy status to other antibiotic agents status: Secondary | ICD-10-CM | POA: Insufficient documentation

## 2023-01-06 DIAGNOSIS — Z79899 Other long term (current) drug therapy: Secondary | ICD-10-CM | POA: Insufficient documentation

## 2023-01-06 DIAGNOSIS — Z8249 Family history of ischemic heart disease and other diseases of the circulatory system: Secondary | ICD-10-CM | POA: Diagnosis not present

## 2023-01-06 DIAGNOSIS — K580 Irritable bowel syndrome with diarrhea: Secondary | ICD-10-CM | POA: Diagnosis not present

## 2023-01-06 DIAGNOSIS — Z823 Family history of stroke: Secondary | ICD-10-CM | POA: Diagnosis not present

## 2023-01-06 DIAGNOSIS — Z8261 Family history of arthritis: Secondary | ICD-10-CM | POA: Diagnosis not present

## 2023-01-06 DIAGNOSIS — J4489 Other specified chronic obstructive pulmonary disease: Secondary | ICD-10-CM | POA: Insufficient documentation

## 2023-01-06 DIAGNOSIS — I252 Old myocardial infarction: Secondary | ICD-10-CM | POA: Diagnosis not present

## 2023-01-06 DIAGNOSIS — Z7289 Other problems related to lifestyle: Secondary | ICD-10-CM | POA: Diagnosis not present

## 2023-01-06 DIAGNOSIS — Z9049 Acquired absence of other specified parts of digestive tract: Secondary | ICD-10-CM | POA: Diagnosis not present

## 2023-01-06 DIAGNOSIS — D45 Polycythemia vera: Secondary | ICD-10-CM | POA: Diagnosis not present

## 2023-01-06 DIAGNOSIS — Z811 Family history of alcohol abuse and dependence: Secondary | ICD-10-CM | POA: Insufficient documentation

## 2023-01-06 DIAGNOSIS — D510 Vitamin B12 deficiency anemia due to intrinsic factor deficiency: Secondary | ICD-10-CM | POA: Diagnosis not present

## 2023-01-06 LAB — CBC WITH DIFFERENTIAL (CANCER CENTER ONLY)
Abs Immature Granulocytes: 0.05 10*3/uL (ref 0.00–0.07)
Basophils Absolute: 0.1 10*3/uL (ref 0.0–0.1)
Basophils Relative: 1 %
Eosinophils Absolute: 0.1 10*3/uL (ref 0.0–0.5)
Eosinophils Relative: 2 %
HCT: 44.8 % (ref 36.0–46.0)
Hemoglobin: 15.8 g/dL — ABNORMAL HIGH (ref 12.0–15.0)
Immature Granulocytes: 1 %
Lymphocytes Relative: 21 %
Lymphs Abs: 1.4 10*3/uL (ref 0.7–4.0)
MCH: 35.3 pg — ABNORMAL HIGH (ref 26.0–34.0)
MCHC: 35.3 g/dL (ref 30.0–36.0)
MCV: 100 fL (ref 80.0–100.0)
Monocytes Absolute: 0.7 10*3/uL (ref 0.1–1.0)
Monocytes Relative: 10 %
Neutro Abs: 4.5 10*3/uL (ref 1.7–7.7)
Neutrophils Relative %: 65 %
Platelet Count: 125 10*3/uL — ABNORMAL LOW (ref 150–400)
RBC: 4.48 MIL/uL (ref 3.87–5.11)
RDW: 11.9 % (ref 11.5–15.5)
WBC Count: 6.8 10*3/uL (ref 4.0–10.5)
nRBC: 0 % (ref 0.0–0.2)

## 2023-01-06 LAB — CMP (CANCER CENTER ONLY)
ALT: 41 U/L (ref 0–44)
AST: 36 U/L (ref 15–41)
Albumin: 3.7 g/dL (ref 3.5–5.0)
Alkaline Phosphatase: 91 U/L (ref 38–126)
Anion gap: 9 (ref 5–15)
BUN: 11 mg/dL (ref 8–23)
CO2: 26 mmol/L (ref 22–32)
Calcium: 9.2 mg/dL (ref 8.9–10.3)
Chloride: 100 mmol/L (ref 98–111)
Creatinine: 0.62 mg/dL (ref 0.44–1.00)
GFR, Estimated: >60 mL/min (ref >60)
Glucose, Bld: 108 mg/dL — ABNORMAL HIGH (ref 70–99)
Potassium: 3.8 mmol/L (ref 3.5–5.1)
Sodium: 135 mmol/L (ref 135–145)
Total Bilirubin: 1 mg/dL (ref <1.2)
Total Protein: 6.8 g/dL (ref 6.5–8.1)

## 2023-01-06 LAB — IRON AND IRON BINDING CAPACITY (CC-WL,HP ONLY)
Iron: 106 ug/dL (ref 28–170)
Saturation Ratios: 33 % — ABNORMAL HIGH (ref 10.4–31.8)
TIBC: 318 ug/dL (ref 250–450)
UIBC: 212 ug/dL (ref 148–442)

## 2023-01-06 LAB — FERRITIN: Ferritin: 56 ng/mL (ref 11–307)

## 2023-01-07 DIAGNOSIS — Z6826 Body mass index (BMI) 26.0-26.9, adult: Secondary | ICD-10-CM | POA: Diagnosis not present

## 2023-01-07 DIAGNOSIS — M47816 Spondylosis without myelopathy or radiculopathy, lumbar region: Secondary | ICD-10-CM | POA: Diagnosis not present

## 2023-01-15 DIAGNOSIS — H40021 Open angle with borderline findings, high risk, right eye: Secondary | ICD-10-CM | POA: Diagnosis not present

## 2023-02-02 DIAGNOSIS — D510 Vitamin B12 deficiency anemia due to intrinsic factor deficiency: Secondary | ICD-10-CM | POA: Diagnosis not present

## 2023-02-04 ENCOUNTER — Other Ambulatory Visit: Payer: Medicare Other

## 2023-02-04 DIAGNOSIS — M47816 Spondylosis without myelopathy or radiculopathy, lumbar region: Secondary | ICD-10-CM | POA: Diagnosis not present

## 2023-02-15 DIAGNOSIS — E559 Vitamin D deficiency, unspecified: Secondary | ICD-10-CM | POA: Diagnosis not present

## 2023-02-15 DIAGNOSIS — D510 Vitamin B12 deficiency anemia due to intrinsic factor deficiency: Secondary | ICD-10-CM | POA: Diagnosis not present

## 2023-02-15 DIAGNOSIS — R7989 Other specified abnormal findings of blood chemistry: Secondary | ICD-10-CM | POA: Diagnosis not present

## 2023-02-15 DIAGNOSIS — Z79899 Other long term (current) drug therapy: Secondary | ICD-10-CM | POA: Diagnosis not present

## 2023-02-15 DIAGNOSIS — D509 Iron deficiency anemia, unspecified: Secondary | ICD-10-CM | POA: Diagnosis not present

## 2023-02-15 DIAGNOSIS — L820 Inflamed seborrheic keratosis: Secondary | ICD-10-CM | POA: Diagnosis not present

## 2023-02-24 DIAGNOSIS — R5382 Chronic fatigue, unspecified: Secondary | ICD-10-CM | POA: Diagnosis not present

## 2023-02-24 DIAGNOSIS — F331 Major depressive disorder, recurrent, moderate: Secondary | ICD-10-CM | POA: Diagnosis not present

## 2023-02-24 DIAGNOSIS — J449 Chronic obstructive pulmonary disease, unspecified: Secondary | ICD-10-CM | POA: Diagnosis not present

## 2023-02-24 DIAGNOSIS — Z86711 Personal history of pulmonary embolism: Secondary | ICD-10-CM | POA: Diagnosis not present

## 2023-02-24 DIAGNOSIS — D6869 Other thrombophilia: Secondary | ICD-10-CM | POA: Diagnosis not present

## 2023-02-24 DIAGNOSIS — Z0000 Encounter for general adult medical examination without abnormal findings: Secondary | ICD-10-CM | POA: Diagnosis not present

## 2023-02-24 DIAGNOSIS — R197 Diarrhea, unspecified: Secondary | ICD-10-CM | POA: Diagnosis not present

## 2023-02-24 DIAGNOSIS — D45 Polycythemia vera: Secondary | ICD-10-CM | POA: Diagnosis not present

## 2023-02-24 DIAGNOSIS — G713 Mitochondrial myopathy, not elsewhere classified: Secondary | ICD-10-CM | POA: Diagnosis not present

## 2023-02-24 DIAGNOSIS — M810 Age-related osteoporosis without current pathological fracture: Secondary | ICD-10-CM | POA: Diagnosis not present

## 2023-02-24 DIAGNOSIS — R251 Tremor, unspecified: Secondary | ICD-10-CM | POA: Diagnosis not present

## 2023-03-01 ENCOUNTER — Ambulatory Visit: Payer: Medicare Other | Admitting: Internal Medicine

## 2023-03-01 ENCOUNTER — Encounter: Payer: Self-pay | Admitting: Internal Medicine

## 2023-03-01 ENCOUNTER — Telehealth: Payer: Self-pay | Admitting: Emergency Medicine

## 2023-03-01 ENCOUNTER — Ambulatory Visit (INDEPENDENT_AMBULATORY_CARE_PROVIDER_SITE_OTHER): Payer: Medicare Other

## 2023-03-01 ENCOUNTER — Telehealth: Payer: Self-pay | Admitting: Internal Medicine

## 2023-03-01 VITALS — BP 136/72 | HR 98 | Temp 97.8°F | Ht 63.0 in

## 2023-03-01 DIAGNOSIS — R0781 Pleurodynia: Secondary | ICD-10-CM | POA: Diagnosis not present

## 2023-03-01 DIAGNOSIS — R0609 Other forms of dyspnea: Secondary | ICD-10-CM | POA: Diagnosis not present

## 2023-03-01 DIAGNOSIS — J449 Chronic obstructive pulmonary disease, unspecified: Secondary | ICD-10-CM

## 2023-03-01 DIAGNOSIS — J984 Other disorders of lung: Secondary | ICD-10-CM | POA: Diagnosis not present

## 2023-03-01 LAB — BASIC METABOLIC PANEL
BUN: 8 mg/dL (ref 6–23)
CO2: 23 meq/L (ref 19–32)
Calcium: 9.1 mg/dL (ref 8.4–10.5)
Chloride: 99 meq/L (ref 96–112)
Creatinine, Ser: 0.53 mg/dL (ref 0.40–1.20)
GFR: 87.88 mL/min (ref >60.00)
Glucose, Bld: 103 mg/dL — ABNORMAL HIGH (ref 70–99)
Potassium: 3.3 meq/L — ABNORMAL LOW (ref 3.5–5.1)
Sodium: 133 meq/L — ABNORMAL LOW (ref 135–145)

## 2023-03-01 LAB — D-DIMER, QUANTITATIVE: D-Dimer, Quant: <0.19 ug{FEU}/mL (ref <0.50)

## 2023-03-01 NOTE — Patient Instructions (Signed)
Order- sample x 2  Breztri inhaler     inhale 2 puffs then rinse mouth, twice daily (before breakfast and before supper)   Try this instead of Stiolto  Order- CXR and left posterior oblique rib view    dx dyspnea, pleuritic pain  Order- lab- D-dimer, BMET    dx dyspnea on exertion

## 2023-03-01 NOTE — Telephone Encounter (Signed)
Pt seen in clinic 03/01/23 and cxr done  Dr Maple Hudson has reviewed the images and states there is no collapse lung  We will wait for final radiology report  I called and spoke with the pt and notified her of this  She verbalized understanding   Nothing further needed at this time

## 2023-03-01 NOTE — Progress Notes (Signed)
Subjective:    Patient ID: [name], female    DOB: [birth year], 78 y.o.   MRN: [id number]  HPI  ROV 10/31/20 --78 year old woman with severe COPD and a positive bronchodilator response, history of DVT and bilateral PE, polycythemia vera with hemochromatosis.  She also has chronic cough with contribution from GERD and rhinitis.  It improved when she increased her PPI.  At our last visit I changed her Symbicort to Stiolto to see if she would get more benefit.  She remains on anticoagulation. The addition of LAMA did seem to help her breathing. She is not having increased mucous. She is on pantoprazole bid. She has a couple coughing spells at night, better w chewing gum. Not having a lot of nasal congestion.   ROV 12/02/2022 --follow-up visit for 78 year old woman with a history of severe COPD with a positive bronchodilator response and asthmatic phenotype.  She has GERD with some associated cough.  Also with a history of polycythemia vera and hemochromatosis, DVT with bilateral PE on Xarelto.  Last seen here in November 2023.  Her PPI was held at that time. Today she reports she has been having more trouble for the last 2 months. More SOB since last time, mainly over the last 2 months. Has noticed some chest tightness, being more aware of her breathing. Can happen at rest. She has noticed it when she lays supine. She has been tried on CPAP for mild OSA - wasn't really able to tolerate. She is worried about the cost of stiolto - is hoping there is a preferred med like anoro. Her flu shot is up to date. COVID booster and rsv  ----------------------------------------------------------------------------- 03/01/23- 78 yoF  with severe COPD and a positive bronchodilator response, history of DVT and bilateral PE, polycythemia vera with hemochromatosis, OSA ([facility]/Dr Athar),   She also has chronic cough with contribution from GERD and rhinitis.  It improved when she increased her PPI.   She remains on  anticoagulation. Last seen by Dr Delton Coombes as above. Acute visit- Albuterol hfa, Symbicort 80/ Stiolto 2.5 (note Inderal 20)                                    Husband here Arrival O2 sat room air 95% She says Stiolto seems to wear off by mid-afternoon, then some relief from rescue inhaler. No cough at all currently. She continues Xarelto DVT prophy. Discussed the use of AI scribe software for clinical note transcription with the patient, who gave verbal consent to proceed.  History of Present Illness   The patient, with a past medical history of a collapsed lung, asthma, COPD, back surgeries, and blood clots, presents with a sudden onset of severe shortness of breath, described as "gasping for air," which started four days ago. The patient reports that the albuterol provides some relief, but the effect wears off after about an hour. The patient also reports a loss of appetite, with no associated nausea or GI upset. The patient has a constant IBS, but there are no new symptoms. (Exquisitely tender to light touch on rib in left posterior axillary line, without rub today.)  The patient also reports a pain in the left side, which started around the same time as the shortness of breath. The pain, initially thought to be due to a pulled muscle, does not hurt when taking a breath but hurts when turning and twisting. The patient denies  any recent trauma or injury that could have caused a cracked rib. The patient also reports that the shortness of breath seems to worsen in the late afternoon or evening.  On Prolia for osteoporosis.     She denies trauma, blood, fever, leg pain or swelling, palpitation. She is concerned that the increased dyspnea and air-hunger reminds her of a collapsed lung years ago. Current onset was about the same time she changed timing of propranolol for tremor, as instructed, to evening. Left post axillary sharp pain onset around same time is not affected by breathing and is clearly  triggered by light rib pressure.   ROS-see HPI   + = positive Constitutional:    weight loss, night sweats, fevers, chills, fatigue, lassitude. HEENT:    headaches, difficulty swallowing, tooth/dental problems, sore throat,       sneezing, itching, ear ache, nasal congestion, post nasal drip, snoring CV:    +chest pain, orthopnea, PND, swelling in lower extremities, anasarca,                                   dizziness, palpitations Resp:   +shortness of breath with exertion or at rest.                productive cough,   non-productive cough, coughing up of blood.              change in color of mucus.  wheezing.   Skin:    rash or lesions. GI:  +IBS,  heartburn, indigestion, abdominal pain, nausea, vomiting, diarrhea,                 change in bowel habits, +loss of appetite GU: dysuria, change in color of urine, no urgency or frequency.   flank pain. MS:   joint pain, stiffness, decreased range of motion, back pain. Neuro-     nothing unusual Psych:  change in mood or affect.  depression or anxiety.   memory loss.   OBJ- Physical Exam      +in Rollator, +room air General- Alert, Oriented, Affect-appropriate, Distress- none acute,  Skin- rash-none, lesions- none, excoriation- none Lymphadenopathy- none Head- atraumatic            Eyes- Gross vision intact, PERRLA, conjunctivae and secretions clear            Ears- Hearing, canals-normal            Nose- Clear, no-Septal dev, mucus, polyps, erosion, perforation             Throat- Mallampati II , mucosa clear , drainage- none, tonsils- atrophic Neck- flexible , trachea midline, no stridor , thyroid nl, carotid no bruit Chest - symmetrical excursion , unlabored           Heart/CV- RRR , no murmur , no gallop  , no rub, nl s1 s2                           - JVD- none , edema- none, stasis changes- none, varices- none           Lung- clear to P&A, wheeze- none, cough- none , dullness-none, rub- none           Chest wall- + marked  tenderness over rib in L posterior axillary line. No rub. Abd-  Br/ Gen/ Rectal- Not done, not indicated Extrem- cyanosis- none,  clubbing, none, atrophy- none, strength- nl Neuro- +mild resting tremor L hand, L foot  Assessment and Plan    COPD exacerbation Sudden onset of dyspnea and air hunger over the past four days. Albuterol provides temporary relief but symptoms return within a few hours. No fever, cough, or other systemic symptoms. No wheezing on exam. -Order chest x-ray and D-dimer to rule out pulmonary embolism and other causes of acute dyspnea. Told her might need CT if any question. -Continue Albuterol as needed. -Start Breztri inhaler before breakfast and supper, rinse mouth after use. See if this covers the late afternoon interval better that Stiolto, but explained can change how/ when Stiolto is taken.  Left-sided chest pain Pain in the left chest, likely musculoskeletal in nature given tenderness to palpation and pain with twisting movements. No pain with deep breaths. Suspect cracked rib. -Consider over-the-counter Voltaren gel and heating pad for pain relief.   Anorexia New onset of anorexia without nausea or other GI symptoms. -Monitor and reassess if persists or worsens.  Essential Tremor Managed with Propranolol, recently switched to evening dosing. -Continue current management.  Chronic Anticoagulation History of pulmonary embolism, currently on Xarelto. -Continue current management.  Hemochromatosis Stable, no recent phlebotomies required. -Continue current management.  Osteoporosis Managed with Prolia injections. -Continue current management.  Follow-up Await results of chest x-ray and D-dimer. If symptoms worsen or new symptoms develop, consider emergency care.

## 2023-03-01 NOTE — Telephone Encounter (Signed)
error 

## 2023-03-04 ENCOUNTER — Telehealth: Payer: Self-pay | Admitting: Internal Medicine

## 2023-03-04 DIAGNOSIS — E538 Deficiency of other specified B group vitamins: Secondary | ICD-10-CM | POA: Diagnosis not present

## 2023-03-04 NOTE — Telephone Encounter (Signed)
 Patient is calling for an update on her chest xray about a possible broken bone as well as her blood work. Please call and advise [phone]

## 2023-03-05 MED ORDER — BREZTRI AEROSPHERE 160-9-4.8 MCG/ACT IN AERO: 2.0000 | INHALATION_SPRAY | Freq: Two times a day (BID) | RESPIRATORY_TRACT | 11 refills | Status: DC

## 2023-03-05 NOTE — Telephone Encounter (Signed)
 Dr Neysa- pt states that the breztri  works better than stiolto  She wanted rx sent  I have sent this in  She is asking how long she should take this, since it's a steroid and she is unsure if we want her to take until next ov in 12 mo  Please advise, thanks

## 2023-03-05 NOTE — Telephone Encounter (Signed)
 Advise she take Breztri  as long as it seems helpful

## 2023-03-05 NOTE — Telephone Encounter (Signed)
 Results of chest xray and labs have been relayed to the patient/authorized caretaker. The patient/authorized caretaker verbalized understanding. No questions at this time.

## 2023-03-08 ENCOUNTER — Telehealth: Payer: Self-pay | Admitting: Physician Assistant

## 2023-03-08 NOTE — Telephone Encounter (Signed)
 Pt is aware of below message/recommendations and voiced her understanding.  She does not need a Rx for Stiolto, as she has a Rx on hand.  Nothing further needed.

## 2023-03-08 NOTE — Telephone Encounter (Signed)
 Suggest that instead of Breztri , she should go back to her Stiolto, taking one puff in the morning, and a second puff at upper time.

## 2023-03-08 NOTE — Telephone Encounter (Signed)
 PT states she was given Breztri  samples by Dr. Linder Revere. She started to use them and they caused brain fog, she also fell and it is not helping her breathing. Can Dr. Linder Revere give her a different  COPD medicine sample to use over the next few weeks. Her husband can come pick them up Her # is [phone]. Call and advise when he can come pick them up. She has been gasping for breath, not SOB, but gasping.

## 2023-03-08 NOTE — Telephone Encounter (Signed)
 Called and spoke to patient.  She stated that she was given a sample of Breztri  on 2/3. She feels that Breztri  is not effective for her breathing and has caused brain fog and shakiness. She reports of a fall last Friday and feels that this was related to the side affects. She did not hit her head during fall, she landed on her knees.  She would like to try a different inhaler.    Dr. Linder Revere, please advise. Thanks

## 2023-03-09 ENCOUNTER — Telehealth: Payer: Self-pay

## 2023-03-09 NOTE — Telephone Encounter (Signed)
*  Pulm  Pharmacy Patient Advocate Encounter   Received notification from CoverMyMeds that prior authorization for Breztri Aerosphere 160-9-4.8MCG/ACT aerosol  is required/requested.   Insurance verification completed.   The patient is insured through [facility] .   Per notes-patient failed Breztri and is switching to [employer].  PA NOT NEEDED FOR BREZTRI DUE TO CHANGE IN THERAPY

## 2023-03-11 NOTE — Telephone Encounter (Signed)
Pt aware of response  Nothing further needed

## 2023-03-30 ENCOUNTER — Other Ambulatory Visit (HOSPITAL_COMMUNITY): Payer: Self-pay | Admitting: *Deleted

## 2023-03-31 ENCOUNTER — Ambulatory Visit (HOSPITAL_COMMUNITY)
Admission: RE | Admit: 2023-03-31 | Discharge: 2023-03-31 | Disposition: A | Discharge status: Home / Self Care | Payer: Medicare Other | Source: Ambulatory Visit | Attending: Internal Medicine | Admitting: Internal Medicine

## 2023-03-31 DIAGNOSIS — E538 Deficiency of other specified B group vitamins: Secondary | ICD-10-CM | POA: Diagnosis not present

## 2023-03-31 DIAGNOSIS — M810 Age-related osteoporosis without current pathological fracture: Secondary | ICD-10-CM | POA: Insufficient documentation

## 2023-03-31 MED ORDER — DENOSUMAB 60 MG/ML ~~LOC~~ SOSY
60.0000 mg | PREFILLED_SYRINGE | Freq: Once | SUBCUTANEOUS | Status: AC
  Administered 2023-03-31: 60 mg via SUBCUTANEOUS

## 2023-03-31 MED ORDER — DENOSUMAB 60 MG/ML ~~LOC~~ SOSY
PREFILLED_SYRINGE | SUBCUTANEOUS | Status: AC
  Filled 2023-03-31: qty 1

## 2023-04-20 ENCOUNTER — Ambulatory Visit: Payer: Medicare Other | Admitting: Neurology

## 2023-04-27 DIAGNOSIS — J4489 Other specified chronic obstructive pulmonary disease: Secondary | ICD-10-CM | POA: Diagnosis not present

## 2023-04-27 DIAGNOSIS — J432 Centrilobular emphysema: Secondary | ICD-10-CM | POA: Diagnosis not present

## 2023-04-29 DIAGNOSIS — J4489 Other specified chronic obstructive pulmonary disease: Secondary | ICD-10-CM | POA: Diagnosis not present

## 2023-04-29 DIAGNOSIS — D510 Vitamin B12 deficiency anemia due to intrinsic factor deficiency: Secondary | ICD-10-CM | POA: Diagnosis not present

## 2023-05-04 DIAGNOSIS — J432 Centrilobular emphysema: Secondary | ICD-10-CM | POA: Diagnosis not present

## 2023-05-12 DIAGNOSIS — Z6825 Body mass index (BMI) 25.0-25.9, adult: Secondary | ICD-10-CM | POA: Diagnosis not present

## 2023-05-12 DIAGNOSIS — M47816 Spondylosis without myelopathy or radiculopathy, lumbar region: Secondary | ICD-10-CM | POA: Diagnosis not present

## 2023-05-13 DIAGNOSIS — N951 Menopausal and female climacteric states: Secondary | ICD-10-CM | POA: Diagnosis not present

## 2023-05-13 NOTE — Telephone Encounter (Signed)
 NFN

## 2023-05-19 ENCOUNTER — Telehealth: Payer: Self-pay | Admitting: *Deleted

## 2023-05-19 NOTE — Telephone Encounter (Signed)
 Received call from pt inquiring about when she was diagnosed with a blood clot to her lung and when she started Xarelto . Advised that it appears that she was diagnosed with a CT angio on 2/29/2021 and started on Xarelto  that same day. She states her GYN doctor wanted to know. No other questions or concerns.

## 2023-06-03 DIAGNOSIS — E538 Deficiency of other specified B group vitamins: Secondary | ICD-10-CM | POA: Diagnosis not present

## 2023-06-08 DIAGNOSIS — J4489 Other specified chronic obstructive pulmonary disease: Secondary | ICD-10-CM | POA: Diagnosis not present

## 2023-06-08 DIAGNOSIS — R5381 Other malaise: Secondary | ICD-10-CM | POA: Diagnosis not present

## 2023-06-08 DIAGNOSIS — R911 Solitary pulmonary nodule: Secondary | ICD-10-CM | POA: Diagnosis not present

## 2023-06-09 ENCOUNTER — Ambulatory Visit (INDEPENDENT_AMBULATORY_CARE_PROVIDER_SITE_OTHER): Admitting: Gastroenterology

## 2023-06-09 ENCOUNTER — Encounter: Payer: Self-pay | Admitting: Gastroenterology

## 2023-06-09 VITALS — BP 128/78 | HR 80 | Ht 63.0 in | Wt 146.6 lb

## 2023-06-09 DIAGNOSIS — K529 Noninfective gastroenteritis and colitis, unspecified: Secondary | ICD-10-CM | POA: Diagnosis not present

## 2023-06-09 MED ORDER — CHOLESTYRAMINE 4 G PO PACK: 4.0000 g | PACK | Freq: Every day | ORAL | 5 refills | Status: AC

## 2023-06-09 NOTE — Progress Notes (Signed)
 06/09/2023 LEVADA NEMECEK [id number] [birth year]   HISTORY OF PRESENT ILLNESS: This is a 78 year old female who is a patient of Dr. Allean Aran.  Has seen in the office in 2019.  Here today with complaints of chronic diarrhea.  Says that she basically lives with diarrhea.  This is not a new issue as it has been going on for years, all or most of her life, but somewhat worsening especially since having her gallbladder removed.  She tells me that most of the time it seems to be in the morning with about 5 bouts each day in the morning.  She says every once in a while she will have issues again in the afternoon.  Has played around with her diet over the years.  Describes watery stools with gas explosions.  No black or bloody looking stools.  Looks like she had negative celiac labs in 2010.  Previous colonoscopies have not indicated any IBD.     Colonoscopy 08/2017:  - One 12 mm polyp in the transverse colon, removed with a cold snare. Resected and retrieved. - One 1 mm polyp in the transverse colon, removed with a cold biopsy forceps. Resected and retrieved. - Non- bleeding internal hemorrhoids.  Pathology:  Tubular adenoma.   Past Medical History:  Diagnosis Date   Allergy    Anemia    Anxiety    Asthma    DR. BYRUM   Blood transfusion without reported diagnosis    Cataract    Cholelithiasis    Chronic kidney disease    gallstones    Compression fracture of body of thoracic vertebra (HCC)    Depression    Dyspnea    Dysrhythmia    palpitations   GERD (gastroesophageal reflux disease)    Heart attack (HCC)    Hemorrhoids    IBS (irritable bowel syndrome)    Iliotibial band syndrome    left knee   Migraine    Mitochondrial myopathy    Normal coronary arteries    by cardiac catheterization 2003   Osteoporosis    Pericarditis    age 67   Pneumothorax    Polycythemia vera(238.4) 06/17/2012   PVC's (premature ventricular contractions)    Vitamin D  deficiency    Past  Surgical History:  Procedure Laterality Date   APPENDECTOMY     CARDIAC CATHETERIZATION     CATARACT EXTRACTION W/ INTRAOCULAR LENS  IMPLANT, BILATERAL     CESAREAN SECTION     x 1   CHOLECYSTECTOMY N/A 06/05/2021   Procedure: LAPAROSCOPIC CHOLECYSTECTOMY WITH INTRAOPERATIVE CHOLANGIOGRAM;  Surgeon: Caralyn Chandler, MD;  Location: [facility];  Service: General;  Laterality: N/A;   COLONOSCOPY     DILATATION & CURETTAGE/HYSTEROSCOPY WITH MYOSURE N/A 10/29/2014   Procedure: DILATATION & CURETTAGE/HYSTEROSCOPY WITH MYOSURE;  Surgeon: Rogene Claude, MD;  Location: [facility];  Service: Gynecology;  Laterality: N/A;   IR FLUORO GUIDED NEEDLE PLC ASPIRATION/INJECTION LOC  06/10/2016   IR FLUORO GUIDED NEEDLE PLC ASPIRATION/INJECTION LOC  06/10/2016   KYPHOPLASTY N/A 05/14/2016   Procedure: T 11 KYPHOPLASTY;  Surgeon: Virl Grimes, MD;  Location: [facility];  Service: Orthopedics;  Laterality: N/A;  T 11 KYPHOPLASTY   KYPHOPLASTY N/A 06/18/2016   Procedure: THORACIC 12 KYPHOPLASTY;  Surgeon: Virl Grimes, MD;  Location: [facility];  Service: Orthopedics;  Laterality: N/A;  THORACIC 12 KYPHOPLASTY; REQUEST 1 HOUR AND FLIP ROOM   MOUTH SURGERY     NASAL SINUS SURGERY     NM MYOCAR PERF  WALL MOTION  04/27/2006   normal   US  ECHOCARDIOGRAPHY  12/25/2010   normal   VARICOSE VEIN SURGERY      reports that she has never smoked. She has never used smokeless tobacco. She reports current alcohol  use of about 14.0 standard drinks of alcohol  per week. She reports that she does not use drugs. family history includes Alcohol  abuse in her father; Arthritis in her mother; Asthma in her mother; COPD in her brother; Depression in her mother; Emphysema in her father; Heart attack in her father; Hypertension in her mother; Lung cancer in her father; Stroke in her father; Tuberculosis in her mother. Allergies  Allergen Reactions   Mometasone  Furo-Formoterol  Fum Cough and Other (See Comments)   Formoterol  Other (See Comments)    Mometasone  Furoate Other (See Comments)   Nitrofurantoin Macrocrystal Itching      Outpatient Encounter Medications as of 06/09/2023  Medication Sig   albuterol  (VENTOLIN  HFA) 108 (90 Base) MCG/ACT inhaler Inhale 2 puffs into the lungs every 6 (six) hours as needed for wheezing or shortness of breath.   CRANBERRY PO Take by mouth.   folic acid  (FOLVITE ) 1 MG tablet TAKE 1 TABLET(1 MG) BY MOUTH DAILY   LORazepam  (ATIVAN ) 0.5 MG tablet TAKE 1 TABLET BY MOUTH EVERY NIGHT AT BEDTIME (Patient taking differently: Take 0.5 mg by mouth at bedtime.)   pantoprazole  (PROTONIX ) 40 MG tablet Take 20 mg by mouth daily.   propranolol  (INDERAL ) 20 MG tablet Take 20 mg by mouth daily.   rivaroxaban  (XARELTO ) 20 MG TABS tablet TAKE 1 TABLET BY MOUTH DAILY WITH SUPPER   STIOLTO RESPIMAT  2.5-2.5 MCG/ACT AERS INHALE 2 PUFFS INTO THE LUNGS DAILY   Vitamin D , Ergocalciferol , (DRISDOL ) 50000 units CAPS capsule Take 1 capsule (50,000 Units total) by mouth every 7 (seven) days. Sundays   [DISCONTINUED] Melatonin 10 MG TABS Take 10 mg by mouth at bedtime as needed.   [DISCONTINUED] mupirocin  ointment (BACTROBAN ) 2 % Apply 1 Application topically 2 (two) times daily.   No facility-administered encounter medications on file as of 06/09/2023.    REVIEW OF SYSTEMS  : All other systems reviewed and negative except where noted in the History of Present Illness.   PHYSICAL EXAM: BP 128/78   Pulse 80   Ht 5\' 3"  (1.6 m)   Wt 146 lb 9.6 oz (66.5 kg)   BMI 25.97 kg/m  General: Well developed white female in no acute distress Head: Normocephalic and atraumatic Eyes:  Sclerae anicteric, conjunctiva pink. Ears: Normal auditory acuity Lungs: Clear throughout to auscultation; no W/R/R. Heart: Regular rate and rhythm; no M/R/G. Musculoskeletal: Symmetrical with no gross deformities  Skin: No lesions on visible extremities Extremities: No edema  Neurological: Alert oriented x 4, grossly non-focal Psychological:  Alert  and cooperative. Normal mood and affect  ASSESSMENT AND PLAN: *Chronic diarrhea: Reports this has been going on for years, all or most of her life, but somewhat worsening especially since having her gallbladder removed.  Looks like she had negative celiac labs in 2010.  Could be a component of IBS being that has been present for so long.  We talked about dietary stuff contributing.  Does not really seem to have any medications that should be contributing.  Previous colonoscopies have not indicated any IBD.  Worsening may be due to bile salt related issues after having her gallbladder removed.  She is reluctant to proceed with colonoscopy (rule out microscopic colitis).  We will try Questran powder 1 packet daily at  lunchtime for now to see if that helps.  Prescription sent to pharmacy.  I gave her paperwork on low FODMAP diet and she may try to play around with some more dietary things although she has done that a lot over the years.  I will have her follow-up with me in about 8 weeks.  If we decide to proceed with colonoscopy will need to be done at the hospital due to pulmonary issues, may need oxygen  in the near future.  She is also on Xarelto .  Could also consider fecal calprotectin and pancreatic fecal elastase.   CC:  Barnetta Liberty, MD

## 2023-06-09 NOTE — Patient Instructions (Signed)
 We have sent the following medications to your pharmacy for you to pick up at your convenience: Questran 1 packet daily at lunch.  _______________________________________________________  If your blood pressure at your visit was 140/90 or greater, please contact your primary care physician to follow up on this.  _______________________________________________________  If you are age 80 or older, your body mass index should be between 23-30. Your Body mass index is 25.97 kg/m. If this is out of the aforementioned range listed, please consider follow up with your Primary Care Provider.  If you are age 87 or younger, your body mass index should be between 19-25. Your Body mass index is 25.97 kg/m. If this is out of the aformentioned range listed, please consider follow up with your Primary Care Provider.   ________________________________________________________  The [facility] providers would like to encourage you to use MYCHART to communicate with providers for non-urgent requests or questions.  Due to long hold times on the telephone, sending your provider a message by [facility] may be a faster and more efficient way to get a response.  Please allow 48 business hours for a response.  Please remember that this is for non-urgent requests.  _______________________________________________________

## 2023-06-24 DIAGNOSIS — L720 Epidermal cyst: Secondary | ICD-10-CM | POA: Diagnosis not present

## 2023-06-24 DIAGNOSIS — L820 Inflamed seborrheic keratosis: Secondary | ICD-10-CM | POA: Diagnosis not present

## 2023-06-24 DIAGNOSIS — L821 Other seborrheic keratosis: Secondary | ICD-10-CM | POA: Diagnosis not present

## 2023-06-30 DIAGNOSIS — D510 Vitamin B12 deficiency anemia due to intrinsic factor deficiency: Secondary | ICD-10-CM | POA: Diagnosis not present

## 2023-07-07 ENCOUNTER — Other Ambulatory Visit: Payer: Medicare Other

## 2023-07-07 ENCOUNTER — Ambulatory Visit: Payer: Medicare Other | Admitting: Physician Assistant

## 2023-07-13 ENCOUNTER — Other Ambulatory Visit: Payer: Self-pay | Admitting: Physician Assistant

## 2023-07-13 DIAGNOSIS — D45 Polycythemia vera: Secondary | ICD-10-CM

## 2023-07-14 ENCOUNTER — Inpatient Hospital Stay: Admitting: Physician Assistant

## 2023-07-14 ENCOUNTER — Inpatient Hospital Stay

## 2023-07-28 ENCOUNTER — Inpatient Hospital Stay: Attending: Physician Assistant

## 2023-07-28 ENCOUNTER — Inpatient Hospital Stay (HOSPITAL_BASED_OUTPATIENT_CLINIC_OR_DEPARTMENT_OTHER): Admitting: Physician Assistant

## 2023-07-28 DIAGNOSIS — E559 Vitamin D deficiency, unspecified: Secondary | ICD-10-CM

## 2023-07-28 DIAGNOSIS — D751 Secondary polycythemia: Secondary | ICD-10-CM | POA: Diagnosis not present

## 2023-07-28 DIAGNOSIS — E538 Deficiency of other specified B group vitamins: Secondary | ICD-10-CM | POA: Diagnosis not present

## 2023-07-28 DIAGNOSIS — R109 Unspecified abdominal pain: Secondary | ICD-10-CM | POA: Insufficient documentation

## 2023-07-28 DIAGNOSIS — I2699 Other pulmonary embolism without acute cor pulmonale: Secondary | ICD-10-CM | POA: Insufficient documentation

## 2023-07-28 DIAGNOSIS — D45 Polycythemia vera: Secondary | ICD-10-CM | POA: Diagnosis not present

## 2023-07-28 DIAGNOSIS — Z7901 Long term (current) use of anticoagulants: Secondary | ICD-10-CM | POA: Insufficient documentation

## 2023-07-28 DIAGNOSIS — K580 Irritable bowel syndrome with diarrhea: Secondary | ICD-10-CM | POA: Diagnosis not present

## 2023-07-28 DIAGNOSIS — R5383 Other fatigue: Secondary | ICD-10-CM

## 2023-07-28 LAB — IRON AND IRON BINDING CAPACITY (CC-WL,HP ONLY)
Iron: 204 ug/dL — ABNORMAL HIGH (ref 28–170)
Saturation Ratios: 63 % — ABNORMAL HIGH (ref 10.4–31.8)
TIBC: 322 ug/dL (ref 250–450)
UIBC: 118 ug/dL — ABNORMAL LOW (ref 148–442)

## 2023-07-28 LAB — CMP (CANCER CENTER ONLY)
ALT: 47 U/L — ABNORMAL HIGH (ref 0–44)
AST: 46 U/L — ABNORMAL HIGH (ref 15–41)
Albumin: 3.7 g/dL (ref 3.5–5.0)
Alkaline Phosphatase: 81 U/L (ref 38–126)
Anion gap: 9 (ref 5–15)
BUN: 8 mg/dL (ref 8–23)
CO2: 24 mmol/L (ref 22–32)
Calcium: 8.9 mg/dL (ref 8.9–10.3)
Chloride: 100 mmol/L (ref 98–111)
Creatinine: 0.53 mg/dL (ref 0.44–1.00)
GFR, Estimated: >60 mL/min (ref >60)
Glucose, Bld: 133 mg/dL — ABNORMAL HIGH (ref 70–99)
Potassium: 3.9 mmol/L (ref 3.5–5.1)
Sodium: 133 mmol/L — ABNORMAL LOW (ref 135–145)
Total Bilirubin: 1.4 mg/dL — ABNORMAL HIGH (ref 0.0–1.2)
Total Protein: 6.8 g/dL (ref 6.5–8.1)

## 2023-07-28 LAB — CBC WITH DIFFERENTIAL (CANCER CENTER ONLY)
Abs Immature Granulocytes: 0.04 10*3/uL (ref 0.00–0.07)
Basophils Absolute: 0 10*3/uL (ref 0.0–0.1)
Basophils Relative: 1 %
Eosinophils Absolute: 0.1 10*3/uL (ref 0.0–0.5)
Eosinophils Relative: 1 %
HCT: 44.6 % (ref 36.0–46.0)
Hemoglobin: 16 g/dL — ABNORMAL HIGH (ref 12.0–15.0)
Immature Granulocytes: 1 %
Lymphocytes Relative: 20 %
Lymphs Abs: 1.3 10*3/uL (ref 0.7–4.0)
MCH: 34.2 pg — ABNORMAL HIGH (ref 26.0–34.0)
MCHC: 35.9 g/dL (ref 30.0–36.0)
MCV: 95.3 fL (ref 80.0–100.0)
Monocytes Absolute: 0.6 10*3/uL (ref 0.1–1.0)
Monocytes Relative: 10 %
Neutro Abs: 4.3 10*3/uL (ref 1.7–7.7)
Neutrophils Relative %: 67 %
Platelet Count: 126 10*3/uL — ABNORMAL LOW (ref 150–400)
RBC: 4.68 MIL/uL (ref 3.87–5.11)
RDW: 12 % (ref 11.5–15.5)
WBC Count: 6.3 10*3/uL (ref 4.0–10.5)
nRBC: 0 % (ref 0.0–0.2)

## 2023-07-28 LAB — VITAMIN D 25 HYDROXY (VIT D DEFICIENCY, FRACTURES): Vit D, 25-Hydroxy: 77.31 ng/mL (ref 30–100)

## 2023-07-28 LAB — VITAMIN B12: Vitamin B-12: 541 pg/mL (ref 180–914)

## 2023-07-28 LAB — FERRITIN: Ferritin: 124 ng/mL (ref 11–307)

## 2023-07-29 ENCOUNTER — Ambulatory Visit: Payer: Self-pay

## 2023-07-29 ENCOUNTER — Encounter: Payer: Self-pay | Admitting: Hematology and Oncology

## 2023-07-29 DIAGNOSIS — L218 Other seborrheic dermatitis: Secondary | ICD-10-CM | POA: Diagnosis not present

## 2023-07-29 NOTE — Telephone Encounter (Signed)
Pt advised and agreed to f/up with her PCP

## 2023-07-29 NOTE — Telephone Encounter (Signed)
-----   Message from [place] ONE[place] Police sent at 07/29/2023 12:43 PM EDT ----- Please notify patient no evidence of vitamin B12 or vitamin D  deficiency. Please have her follow up with her PCP regarding her fatigue.   ----- Message ----- From: Interface, Lab In [place] Sent: 07/28/2023   1:41 PM EDT To: [place] Police, PA-C

## 2023-07-29 NOTE — Progress Notes (Signed)
 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 female with medical history significant for polycythemia (JAK2 negative) and Hereditary Hemochromatosis (H63D heterozygote mutation) who presents for a follow up visit. The patient's last visit was on 01/06/2023. In the interim since the last visit she has had no major changes in her health.  On exam today [name] reports having ongoing fatigue which does interfere with her ADLs.  Her appetite is stable without any weight changes over the last couple of months.  She continues to struggle with diarrhea and abdominal cramping.  She was recently started on Questran  by her GI team but she did not tolerate that due to persistent abdominal cramping so she discontinued the medication.  She denies easy bruising or signs of active bleeding.  She denies any signs or symptoms of VTE including peripheral edema, shortness of breath or chest pain.  She is tolerating her Xarelto  without any bleeding or bruising episodes.  She denies fevers, chills, night sweats headaches or dizziness.  She has no other complaints.  A full 10 point ROS was otherwise negative.  MEDICAL HISTORY:  Past Medical History:  Diagnosis Date   Allergy    Anemia    Anxiety    Asthma    DR. BYRUM   Blood transfusion without reported diagnosis    Cataract    Cholelithiasis    Chronic kidney disease    gallstones    Compression fracture of body of thoracic vertebra (HCC)    Depression    Dyspnea    Dysrhythmia    palpitations    GERD (gastroesophageal reflux disease)    Heart attack (HCC)    Hemorrhoids    IBS (irritable bowel syndrome)    Iliotibial band syndrome    left knee   Migraine    Mitochondrial myopathy    Normal coronary arteries    by cardiac catheterization 2003   Osteoporosis    Pericarditis    age 67   Pneumothorax    Polycythemia vera(238.4) 06/17/2012   PVC's (premature ventricular contractions)    Vitamin D  deficiency     SURGICAL HISTORY: Past Surgical History:  Procedure Laterality Date   APPENDECTOMY     CARDIAC CATHETERIZATION     CATARACT EXTRACTION W/ INTRAOCULAR LENS  IMPLANT, BILATERAL     CESAREAN SECTION     x 1   CHOLECYSTECTOMY N/A 06/05/2021   Procedure: LAPAROSCOPIC CHOLECYSTECTOMY WITH INTRAOPERATIVE CHOLANGIOGRAM;  Surgeon: Curvin Deward MOULD, MD;  Location: [facility];  Service: General;  Laterality: N/A;   COLONOSCOPY     DILATATION & CURETTAGE/HYSTEROSCOPY WITH MYOSURE N/A 10/29/2014   Procedure: DILATATION & CURETTAGE/HYSTEROSCOPY WITH MYOSURE;  Surgeon: Nathanel Bunker, MD;  Location: [facility];  Service: Gynecology;  Laterality: N/A;   IR FLUORO GUIDED NEEDLE PLC ASPIRATION/INJECTION LOC  06/10/2016   IR FLUORO GUIDED NEEDLE PLC ASPIRATION/INJECTION LOC  06/10/2016   KYPHOPLASTY N/A 05/14/2016   Procedure: T 11 KYPHOPLASTY;  Surgeon: Oneil Priestly, MD;  Location: [facility];  Service: Orthopedics;  Laterality: N/A;  T 11 KYPHOPLASTY   KYPHOPLASTY N/A 06/18/2016   Procedure: THORACIC 12 KYPHOPLASTY;  Surgeon: Beuford Anes, MD;  [facility];  Service: Orthopedics;  Laterality: N/A;  THORACIC 12 KYPHOPLASTY; REQUEST 1 HOUR AND FLIP ROOM   MOUTH SURGERY     NASAL SINUS SURGERY     NM MYOCAR PERF WALL MOTION  04/27/2006   normal   US  ECHOCARDIOGRAPHY  12/25/2010   normal   VARICOSE VEIN SURGERY      SOCIAL HISTORY: Social History   Socioeconomic History   Marital status: Married    Spouse name: Richard   Number of children: 2   Years of education: Grad   Highest  education level: Not on file  Occupational History   Occupation: Patent examiner: RETIRED  Tobacco Use   Smoking status: Never   Smokeless tobacco: Never   Tobacco comments:    never used tobacco  Vaping Use   Vaping status: Never Used  Substance and Sexual Activity   Alcohol  use: Yes    Alcohol /week: 14.0 standard drinks of alcohol     Types: 14 Glasses of wine per week    Comment: occasionally   Drug use: No   Sexual activity: Not on file  Other Topics Concern   Not on file  Social History Narrative   Health Care POA:    Emergency Contact: husband, Charlie Sharps, (c) [phone]   End of Life Plan:    Who lives with you: husband   Any pets: none   Diet: Pt has a varied diet.  Eats 5 sm. meals throughout day, focuses on protein, doesn't care for fruits and vegetables very much.   Exercise: Pt has a personal training and exercises several times a week.   Seatbelts: Pt reports wearing seatbelt when in vehicle.   [place] Exposure/Protection: Pt reports wearing sun protection.    Hobbies: reading, visiting with friends   Patient has a Event organiser.   Patient has two children.   Patient is retired.   Patient does not drink any caffeine.   Patient is right handed.         Social Drivers of Corporate investment banker Strain: Not on file  Food Insecurity: Not on file  Transportation Needs: Not on file  Physical Activity: Not on file  Stress: Not on file  Social Connections: Not on file  Intimate Partner Violence: Not on file    FAMILY HISTORY: Family History  Problem Relation Age of Onset   Asthma Mother    Arthritis Mother    Depression Mother    Hypertension Mother    Tuberculosis Mother    Emphysema Father    Stroke Father    Alcohol  abuse Father    Heart attack Father    Lung cancer Father    COPD Brother    Colon cancer Neg Hx    Esophageal cancer Neg Hx    Stomach cancer Neg Hx    Pancreatic cancer Neg Hx    Sleep apnea Neg Hx      ALLERGIES:  is allergic to mometasone  furo-formoterol  fum, formoterol , mometasone  furoate, and nitrofurantoin macrocrystal.  MEDICATIONS:  Current Outpatient Medications  Medication Sig Dispense Refill   albuterol  (VENTOLIN  HFA) 108 (90 Base) MCG/ACT inhaler Inhale 2 puffs into the lungs every 6 (six) hours as needed for wheezing or shortness of breath. 8 g 6   CRANBERRY PO Take by mouth.     folic acid  (FOLVITE ) 1 MG tablet TAKE 1 TABLET(1 MG) BY MOUTH DAILY 30 tablet 11   LORazepam  (ATIVAN ) 0.5 MG  tablet TAKE 1 TABLET BY MOUTH EVERY NIGHT AT BEDTIME (Patient taking differently: Take 0.5 mg by mouth at bedtime.) 30 tablet 0   pantoprazole  (PROTONIX ) 40 MG tablet Take 20 mg by mouth daily.     propranolol  (INDERAL ) 20 MG tablet Take 20 mg by mouth daily.     rivaroxaban  (XARELTO ) 20 MG TABS tablet TAKE 1 TABLET BY MOUTH DAILY WITH SUPPER 90 tablet 2   STIOLTO RESPIMAT  2.5-2.5 MCG/ACT AERS INHALE 2 PUFFS INTO THE LUNGS DAILY 4 g 11   Vitamin D , Ergocalciferol , (DRISDOL ) 50000 units CAPS capsule Take 1 capsule (50,000 Units total) by mouth every 7 (seven) days. Sundays 30 capsule 3   cholestyramine  (QUESTRAN ) 4 g packet Take 1 packet (4 g total) by mouth daily. (Patient not taking: Reported on 07/28/2023) 30 packet 5   No current facility-administered medications for this visit.    REVIEW OF SYSTEMS:   Constitutional: ( - ) fevers, ( - )  chills , ( - ) night sweats Eyes: ( - ) blurriness of vision, ( - ) double vision, ( - ) watery eyes Ears, nose, mouth, throat, and face: ( - ) mucositis, ( - ) sore throat Respiratory: ( - ) cough, ( - ) dyspnea, ( - ) wheezes Cardiovascular: ( - ) palpitation, ( - ) chest discomfort, ( - ) lower extremity swelling Gastrointestinal:  ( - ) nausea, ( - ) heartburn, ( - ) change in bowel habits Skin: ( - ) abnormal skin rashes Lymphatics: ( - ) new lymphadenopathy, ( - ) easy bruising Neurological: ( - ) numbness, ( - ) tingling, ( - ) new  weaknesses Behavioral/Psych: ( - ) mood change, ( - ) new changes  All other systems were reviewed with the patient and are negative.  PHYSICAL EXAMINATION:  Vitals:   07/28/23 1345  BP: (!) 145/60  Pulse: 84  Resp: 18  Temp: 97.7 F (36.5 C)  SpO2: 96%     Filed Weights   07/28/23 1345  Weight: 147 lb 4.8 oz (66.8 kg)     GENERAL: Well-appearing elderly Caucasian, alert, no distress and comfortable SKIN: skin color, texture, turgor are normal, no rashes or significant lesions EYES: conjunctiva are pink and non-injected, sclera clear LUNGS: clear to auscultation and percussion with normal breathing effort HEART: regular rate & rhythm and no murmurs and no lower extremity edema Musculoskeletal: no cyanosis of digits and no clubbing  PSYCH: alert & oriented x 3, fluent speech NEURO: no focal motor/sensory deficits  LABORATORY DATA:  I have reviewed the data as listed    Latest Ref Rng & Units 07/28/2023    1:18 PM 01/06/2023    2:07 PM 12/10/2022    1:13 PM  CBC  WBC 4.0 - 10.5 K/uL 6.3  6.8  6.9   Hemoglobin 12.0 - 15.0 g/dL 83.9  84.1  84.1   Hematocrit 36.0 - 46.0 % 44.6  44.8  45.2   Platelets 150 - 400 K/uL 126  125  119        Latest Ref Rng & Units 07/28/2023    1:18 PM 03/01/2023    3:00 PM 01/06/2023    2:07 PM  CMP  Glucose 70 - 99 mg/dL 866  896  891   BUN 8 - 23 mg/dL 8  8  11    Creatinine 0.44 - 1.00 mg/dL 9.46  9.46  9.37   Sodium 135 - 145 mmol/L 133  133  135   Potassium 3.5 -  5.1 mmol/L 3.9  3.3  3.8   Chloride 98 - 111 mmol/L 100  99  100   CO2 22 - 32 mmol/L 24  23  26    Calcium 8.9 - 10.3 mg/dL 8.9  9.1  9.2   Total Protein 6.5 - 8.1 g/dL 6.8   6.8   Total Bilirubin 0.0 - 1.2 mg/dL 1.4   1.0   Alkaline Phos 38 - 126 U/L 81   91   AST 15 - 41 U/L 46   36   ALT 0 - 44 U/L 47   41     No results found for: MPROTEIN Lab Results  Component Value Date   KAPLAMBRATIO 0.91 04/27/2016    RADIOGRAPHIC STUDIES: No results  found.  ASSESSMENT & PLAN [name] a 78 y.o.female with medical history significant for polycythemia (JAK2 negative) and Hereditary Hemochromatosis (H63D heterozygote mutation) who presents for a follow up visit.  # Polycythemia, Unclear Etiology --patient noted to be JAK2 negative. 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  --Of note, bone marrow biopsy performed in 2014 did not show evidence of myeloproliferative neoplasm. --the patient has no driver mutation, there is not a clear indication for phlebotomy --patient is a non-smoker with no symptoms of sleep apnea --Labs from today were reviewed. Stable hgb level measuring 16.0, Hct 44.6%. --Monitor for now.  --RTC in 6 months time.  #Elevated LFTs:  --Mild elevation with AST 46, ALT 47 --Etiologies include medication induced (xarelto ) and liver disease.  --Last MRI from May 2023 showed  somewhat coarse, nodular contour of the liver with heterogeneous parenchymal contrast enhancement, suggesting cirrhosis.  Will request follow up with GI team  #Fatigue: --No evidence of vitamin B12 or vitamin D  deficiency --Advised to follow up with PCP to further evaluate  # Bone Density -- Last bone density test on the 08/14/2022.  Defer future bone density testing and mammogram orders to patient's PCP.  # Hereditary Hemochromatosis (H63D heterozygote mutation) --no indication for phlebotomy for an H63D heterozygote.  --ferritin level is 124 today.  --continue to monitor   # History of Unprovoked Pulmonary Embolism --continue Xarelto  20 mg PO daily  --positive lupus anticoagulant panel (collect on anticoagulation). Negative antibodies. Low clinic suspicion on APS  Orders Placed This Encounter  Procedures   Vitamin D  25 hydroxy    Standing Status:   Future    Number of Occurrences:   1    Expiration Date:   07/27/2024   Vitamin B12    Standing Status:   Future    Number of Occurrences:   1    Expiration Date:    07/27/2024    All questions were answered. The patient knows to call the clinic with any problems, questions or concerns.  A total of more than 30 minutes were spent on this encounter with face-to-face time and non-face-to-face time, including preparing to see the patient, ordering tests and/or medications, counseling the patient and coordination of care as outlined above.   Johnston Police PA-C Dept of Hematology and Oncology [facility] Phone: [phone]   07/29/2023 11:19 AM

## 2023-08-04 ENCOUNTER — Ambulatory Visit: Admitting: Gastroenterology

## 2023-08-04 DIAGNOSIS — R911 Solitary pulmonary nodule: Secondary | ICD-10-CM | POA: Diagnosis not present

## 2023-08-06 ENCOUNTER — Ambulatory Visit: Admitting: Gastroenterology

## 2023-08-11 DIAGNOSIS — K219 Gastro-esophageal reflux disease without esophagitis: Secondary | ICD-10-CM | POA: Diagnosis not present

## 2023-08-11 DIAGNOSIS — R053 Chronic cough: Secondary | ICD-10-CM | POA: Diagnosis not present

## 2023-08-17 DIAGNOSIS — M47816 Spondylosis without myelopathy or radiculopathy, lumbar region: Secondary | ICD-10-CM | POA: Diagnosis not present

## 2023-08-31 ENCOUNTER — Other Ambulatory Visit: Payer: Self-pay | Admitting: Hematology and Oncology

## 2023-08-31 DIAGNOSIS — R5381 Other malaise: Secondary | ICD-10-CM | POA: Diagnosis not present

## 2023-08-31 DIAGNOSIS — R911 Solitary pulmonary nodule: Secondary | ICD-10-CM | POA: Diagnosis not present

## 2023-08-31 DIAGNOSIS — I2699 Other pulmonary embolism without acute cor pulmonale: Secondary | ICD-10-CM

## 2023-08-31 DIAGNOSIS — J4489 Other specified chronic obstructive pulmonary disease: Secondary | ICD-10-CM | POA: Diagnosis not present

## 2023-08-31 DIAGNOSIS — I82412 Acute embolism and thrombosis of left femoral vein: Secondary | ICD-10-CM

## 2023-08-31 DIAGNOSIS — E538 Deficiency of other specified B group vitamins: Secondary | ICD-10-CM | POA: Diagnosis not present

## 2023-09-06 ENCOUNTER — Telehealth (HOSPITAL_COMMUNITY): Payer: Self-pay | Admitting: Pharmacy Technician

## 2023-09-06 NOTE — Telephone Encounter (Signed)
 Auth Submission: NO AUTH NEEDED Site of care: [facility] Payer: Medicare A/B, AARP Supp  Medication & CPT/J Code(s) submitted: Prolia  (Denosumab ) [id number] Diagnosis Code:  Route of submission (phone, fax, portal):  Phone # Fax # Auth type: Buy/Bill HB Units/visits requested: 60mg  x 2 doses, q 6 months Reference number:  Approval from: 09/06/23 to 02/26/24  Medicare A/B will cover 80%, AARP Supp will cover remaining 20%. Med will be covered at 100%.   Melissaann Dizdarevic, CPhT [facility] Phone: [phone] 09/06/2023

## 2023-09-22 ENCOUNTER — Ambulatory Visit: Admitting: Gastroenterology

## 2023-09-30 ENCOUNTER — Other Ambulatory Visit (HOSPITAL_COMMUNITY): Payer: Self-pay | Admitting: *Deleted

## 2023-09-30 DIAGNOSIS — Z23 Encounter for immunization: Secondary | ICD-10-CM | POA: Diagnosis not present

## 2023-09-30 DIAGNOSIS — E538 Deficiency of other specified B group vitamins: Secondary | ICD-10-CM | POA: Diagnosis not present

## 2023-10-04 ENCOUNTER — Ambulatory Visit (HOSPITAL_COMMUNITY)
Admission: RE | Admit: 2023-10-04 | Discharge: 2023-10-04 | Disposition: A | Discharge status: Home / Self Care | Source: Ambulatory Visit | Attending: Internal Medicine | Admitting: Internal Medicine

## 2023-10-04 DIAGNOSIS — M810 Age-related osteoporosis without current pathological fracture: Secondary | ICD-10-CM | POA: Insufficient documentation

## 2023-10-04 MED ORDER — DENOSUMAB 60 MG/ML ~~LOC~~ SOSY
PREFILLED_SYRINGE | SUBCUTANEOUS | Status: AC
  Filled 2023-10-04: qty 1

## 2023-10-04 MED ORDER — DENOSUMAB 60 MG/ML ~~LOC~~ SOSY
60.0000 mg | PREFILLED_SYRINGE | Freq: Once | SUBCUTANEOUS | Status: AC
  Administered 2023-10-04: 60 mg via SUBCUTANEOUS

## 2023-10-26 DIAGNOSIS — N3020 Other chronic cystitis without hematuria: Secondary | ICD-10-CM | POA: Diagnosis not present

## 2023-10-26 DIAGNOSIS — R3915 Urgency of urination: Secondary | ICD-10-CM | POA: Diagnosis not present

## 2023-10-26 DIAGNOSIS — Z23 Encounter for immunization: Secondary | ICD-10-CM | POA: Diagnosis not present

## 2023-11-03 DIAGNOSIS — E538 Deficiency of other specified B group vitamins: Secondary | ICD-10-CM | POA: Diagnosis not present

## 2023-11-09 ENCOUNTER — Ambulatory Visit: Admitting: Gastroenterology

## 2023-12-02 DIAGNOSIS — E538 Deficiency of other specified B group vitamins: Secondary | ICD-10-CM | POA: Diagnosis not present

## 2023-12-13 ENCOUNTER — Other Ambulatory Visit (HOSPITAL_COMMUNITY): Payer: Self-pay

## 2023-12-13 NOTE — Telephone Encounter (Signed)
 error

## 2023-12-30 ENCOUNTER — Ambulatory Visit: Admitting: Gastroenterology

## 2024-01-11 ENCOUNTER — Other Ambulatory Visit: Payer: Self-pay | Admitting: Emergency Medicine

## 2024-01-12 ENCOUNTER — Other Ambulatory Visit: Payer: Self-pay | Admitting: *Deleted

## 2024-01-12 DIAGNOSIS — R7983 Abnormal findings of blood amino-acid level: Secondary | ICD-10-CM

## 2024-01-12 MED ORDER — FOLIC ACID 1 MG PO TABS: 1.0000 mg | ORAL_TABLET | Freq: Every day | ORAL | 11 refills | Status: AC

## 2024-02-03 ENCOUNTER — Telehealth: Payer: Self-pay | Admitting: Physician Assistant

## 2024-02-03 NOTE — Telephone Encounter (Signed)
 I SPOKE WITH PATIENT AS SHE CALLED IN TO RESCHEDULE LAB AND PA APPOINTMENTS FROM 02/04/2024 TO 03/01/2024.

## 2024-02-04 ENCOUNTER — Inpatient Hospital Stay: Admitting: Physician Assistant

## 2024-02-04 ENCOUNTER — Ambulatory Visit: Admitting: Gastroenterology

## 2024-02-04 ENCOUNTER — Ambulatory Visit: Admitting: Physician Assistant

## 2024-02-04 ENCOUNTER — Other Ambulatory Visit

## 2024-02-04 ENCOUNTER — Inpatient Hospital Stay

## 2024-02-23 ENCOUNTER — Other Ambulatory Visit: Payer: Self-pay | Admitting: *Deleted

## 2024-02-23 DIAGNOSIS — I82412 Acute embolism and thrombosis of left femoral vein: Secondary | ICD-10-CM

## 2024-02-23 DIAGNOSIS — I2699 Other pulmonary embolism without acute cor pulmonale: Secondary | ICD-10-CM

## 2024-02-23 MED ORDER — RIVAROXABAN 20 MG PO TABS: 20.0000 mg | ORAL_TABLET | Freq: Every day | ORAL | 2 refills | Status: AC

## 2024-02-28 ENCOUNTER — Ambulatory Visit: Admitting: Gastroenterology

## 2024-02-29 ENCOUNTER — Telehealth: Payer: Self-pay | Admitting: Hematology and Oncology

## 2024-02-29 NOTE — Telephone Encounter (Signed)
 Pt left a voicemail, I returned the call to reschedule appt

## 2024-03-01 ENCOUNTER — Inpatient Hospital Stay

## 2024-03-01 ENCOUNTER — Inpatient Hospital Stay: Admitting: Physician Assistant

## 2024-03-29 ENCOUNTER — Ambulatory Visit: Admitting: Gastroenterology

## 2024-03-31 ENCOUNTER — Inpatient Hospital Stay

## 2024-03-31 ENCOUNTER — Inpatient Hospital Stay: Admitting: Physician Assistant
